# Patient Record
Sex: Female | Born: 1980 | Race: White | Hispanic: No | Marital: Single | Smoking: Never smoker
Health system: Southern US, Community
[De-identification: ages and names within clinical notes are randomized; demographics above are authoritative.]

## PROBLEM LIST (undated history)

## (undated) DIAGNOSIS — B379 Candidiasis, unspecified: Secondary | ICD-10-CM

## (undated) DIAGNOSIS — F32A Depression, unspecified: Secondary | ICD-10-CM

## (undated) DIAGNOSIS — F4310 Post-traumatic stress disorder, unspecified: Secondary | ICD-10-CM

## (undated) DIAGNOSIS — G35D Multiple sclerosis, unspecified: Secondary | ICD-10-CM

## (undated) DIAGNOSIS — M7989 Other specified soft tissue disorders: Secondary | ICD-10-CM

## (undated) DIAGNOSIS — N210 Calculus in bladder: Secondary | ICD-10-CM

## (undated) DIAGNOSIS — K219 Gastro-esophageal reflux disease without esophagitis: Secondary | ICD-10-CM

## (undated) DIAGNOSIS — E282 Polycystic ovarian syndrome: Secondary | ICD-10-CM

## (undated) DIAGNOSIS — L304 Erythema intertrigo: Secondary | ICD-10-CM

## (undated) DIAGNOSIS — G35 Multiple sclerosis: Secondary | ICD-10-CM

## (undated) DIAGNOSIS — G373 Acute transverse myelitis in demyelinating disease of central nervous system: Secondary | ICD-10-CM

## (undated) DIAGNOSIS — F329 Major depressive disorder, single episode, unspecified: Secondary | ICD-10-CM

## (undated) DIAGNOSIS — G8220 Paraplegia, unspecified: Secondary | ICD-10-CM

## (undated) HISTORY — DX: Other specified soft tissue disorders: M79.89

## (undated) HISTORY — DX: Erythema intertrigo: L30.4

## (undated) HISTORY — PX: TONSILLECTOMY: SUR1361

## (undated) HISTORY — PX: CHOLECYSTECTOMY: SHX55

## (undated) HISTORY — DX: Candidiasis, unspecified: B37.9

## (undated) HISTORY — PX: WISDOM TOOTH EXTRACTION: SHX21

## (undated) HISTORY — PX: CYSTECTOMY W/ URETEROILEAL CONDUIT: SUR361

---
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
## 2001-04-01 ENCOUNTER — Emergency Department (HOSPITAL_COMMUNITY): Admission: EM | Admit: 2001-04-01 | Discharge: 2001-04-01 | Payer: Self-pay | Admitting: Emergency Medicine

## 2001-11-06 ENCOUNTER — Other Ambulatory Visit: Admission: RE | Admit: 2001-11-06 | Discharge: 2001-11-06 | Payer: Self-pay | Admitting: Family Medicine

## 2002-11-10 ENCOUNTER — Other Ambulatory Visit: Admission: RE | Admit: 2002-11-10 | Discharge: 2002-11-10 | Payer: Self-pay | Admitting: Family Medicine

## 2003-05-18 ENCOUNTER — Emergency Department (HOSPITAL_COMMUNITY): Admission: EM | Admit: 2003-05-18 | Discharge: 2003-05-18 | Payer: Self-pay | Admitting: Emergency Medicine

## 2003-05-19 ENCOUNTER — Ambulatory Visit (HOSPITAL_COMMUNITY): Admission: RE | Admit: 2003-05-19 | Discharge: 2003-05-19 | Payer: Self-pay | Admitting: Family Medicine

## 2003-05-20 ENCOUNTER — Inpatient Hospital Stay (HOSPITAL_COMMUNITY): Admission: AD | Admit: 2003-05-20 | Discharge: 2003-05-25 | Payer: Self-pay | Admitting: Neurology

## 2003-05-21 ENCOUNTER — Encounter (INDEPENDENT_AMBULATORY_CARE_PROVIDER_SITE_OTHER): Payer: Self-pay | Admitting: Specialist

## 2003-05-25 ENCOUNTER — Inpatient Hospital Stay (HOSPITAL_COMMUNITY)
Admission: RE | Admit: 2003-05-25 | Discharge: 2003-07-15 | Payer: Self-pay | Admitting: Physical Medicine & Rehabilitation

## 2003-07-22 ENCOUNTER — Ambulatory Visit (HOSPITAL_COMMUNITY): Admission: RE | Admit: 2003-07-22 | Discharge: 2003-07-22 | Payer: Self-pay | Admitting: Urology

## 2003-09-03 ENCOUNTER — Emergency Department (HOSPITAL_COMMUNITY): Admission: EM | Admit: 2003-09-03 | Discharge: 2003-09-04 | Payer: Self-pay | Admitting: Emergency Medicine

## 2003-09-04 ENCOUNTER — Inpatient Hospital Stay (HOSPITAL_COMMUNITY): Admission: EM | Admit: 2003-09-04 | Discharge: 2003-09-07 | Payer: Self-pay | Admitting: Emergency Medicine

## 2003-09-13 ENCOUNTER — Encounter: Admission: RE | Admit: 2003-09-13 | Discharge: 2003-12-12 | Payer: Self-pay | Admitting: Neurology

## 2003-10-15 ENCOUNTER — Emergency Department (HOSPITAL_COMMUNITY): Admission: EM | Admit: 2003-10-15 | Discharge: 2003-10-15 | Payer: Self-pay | Admitting: *Deleted

## 2003-11-07 ENCOUNTER — Emergency Department (HOSPITAL_COMMUNITY): Admission: EM | Admit: 2003-11-07 | Discharge: 2003-11-07 | Payer: Self-pay | Admitting: Emergency Medicine

## 2003-12-13 ENCOUNTER — Encounter: Admission: RE | Admit: 2003-12-13 | Discharge: 2004-03-12 | Payer: Self-pay | Admitting: Neurology

## 2003-12-24 ENCOUNTER — Encounter: Admission: RE | Admit: 2003-12-24 | Discharge: 2004-03-23 | Payer: Self-pay | Admitting: Psychology

## 2004-03-28 ENCOUNTER — Encounter: Admission: RE | Admit: 2004-03-28 | Discharge: 2004-06-26 | Payer: Self-pay | Admitting: Neurology

## 2005-04-13 ENCOUNTER — Inpatient Hospital Stay (HOSPITAL_COMMUNITY): Admission: EM | Admit: 2005-04-13 | Discharge: 2005-04-17 | Payer: Self-pay | Admitting: Emergency Medicine

## 2005-04-24 ENCOUNTER — Encounter: Admission: RE | Admit: 2005-04-24 | Discharge: 2005-07-23 | Payer: Self-pay | Admitting: Neurology

## 2005-07-20 ENCOUNTER — Other Ambulatory Visit: Admission: RE | Admit: 2005-07-20 | Discharge: 2005-07-20 | Payer: Self-pay | Admitting: Family Medicine

## 2005-12-06 ENCOUNTER — Encounter: Admission: RE | Admit: 2005-12-06 | Discharge: 2006-03-06 | Payer: Self-pay | Admitting: Orthopedic Surgery

## 2005-12-21 ENCOUNTER — Ambulatory Visit (HOSPITAL_COMMUNITY): Admission: RE | Admit: 2005-12-21 | Discharge: 2005-12-21 | Payer: Self-pay | Admitting: Orthopedic Surgery

## 2006-03-28 ENCOUNTER — Emergency Department (HOSPITAL_COMMUNITY): Admission: EM | Admit: 2006-03-28 | Discharge: 2006-03-28 | Payer: Self-pay | Admitting: Emergency Medicine

## 2006-05-18 ENCOUNTER — Inpatient Hospital Stay (HOSPITAL_COMMUNITY): Admission: RE | Admit: 2006-05-18 | Discharge: 2006-05-29 | Payer: Self-pay | Admitting: Urology

## 2006-05-20 ENCOUNTER — Encounter (INDEPENDENT_AMBULATORY_CARE_PROVIDER_SITE_OTHER): Payer: Self-pay | Admitting: Specialist

## 2006-11-08 ENCOUNTER — Emergency Department (HOSPITAL_COMMUNITY): Admission: EM | Admit: 2006-11-08 | Discharge: 2006-11-08 | Payer: Self-pay | Admitting: Emergency Medicine

## 2007-01-01 ENCOUNTER — Inpatient Hospital Stay (HOSPITAL_COMMUNITY): Admission: EM | Admit: 2007-01-01 | Discharge: 2007-01-16 | Payer: Self-pay | Admitting: Emergency Medicine

## 2007-02-01 ENCOUNTER — Emergency Department (HOSPITAL_COMMUNITY): Admission: EM | Admit: 2007-02-01 | Discharge: 2007-02-02 | Payer: Self-pay | Admitting: Emergency Medicine

## 2007-02-19 ENCOUNTER — Inpatient Hospital Stay (HOSPITAL_COMMUNITY): Admission: RE | Admit: 2007-02-19 | Discharge: 2007-02-20 | Payer: Self-pay | Admitting: Urology

## 2007-07-22 ENCOUNTER — Emergency Department (HOSPITAL_COMMUNITY): Admission: EM | Admit: 2007-07-22 | Discharge: 2007-07-22 | Payer: Self-pay | Admitting: Emergency Medicine

## 2007-10-08 ENCOUNTER — Emergency Department (HOSPITAL_COMMUNITY): Admission: EM | Admit: 2007-10-08 | Discharge: 2007-10-08 | Payer: Self-pay | Admitting: Emergency Medicine

## 2007-10-19 ENCOUNTER — Emergency Department (HOSPITAL_COMMUNITY): Admission: EM | Admit: 2007-10-19 | Discharge: 2007-10-19 | Payer: Self-pay | Admitting: Emergency Medicine

## 2007-10-21 ENCOUNTER — Ambulatory Visit (HOSPITAL_COMMUNITY): Admission: RE | Admit: 2007-10-21 | Discharge: 2007-10-21 | Payer: Self-pay | Admitting: Urology

## 2007-11-02 ENCOUNTER — Inpatient Hospital Stay (HOSPITAL_COMMUNITY): Admission: AD | Admit: 2007-11-02 | Discharge: 2007-11-10 | Payer: Self-pay | Admitting: Internal Medicine

## 2007-11-02 ENCOUNTER — Encounter: Payer: Self-pay | Admitting: Emergency Medicine

## 2007-11-14 ENCOUNTER — Inpatient Hospital Stay (HOSPITAL_COMMUNITY): Admission: EM | Admit: 2007-11-14 | Discharge: 2007-11-18 | Payer: Self-pay | Admitting: Emergency Medicine

## 2007-12-12 ENCOUNTER — Emergency Department (HOSPITAL_COMMUNITY): Admission: EM | Admit: 2007-12-12 | Discharge: 2007-12-12 | Payer: Self-pay | Admitting: Emergency Medicine

## 2008-01-07 ENCOUNTER — Inpatient Hospital Stay (HOSPITAL_COMMUNITY): Admission: AD | Admit: 2008-01-07 | Discharge: 2008-01-07 | Payer: Self-pay | Admitting: Obstetrics & Gynecology

## 2008-01-15 ENCOUNTER — Ambulatory Visit: Payer: Self-pay | Admitting: Obstetrics & Gynecology

## 2008-01-20 ENCOUNTER — Inpatient Hospital Stay (HOSPITAL_COMMUNITY): Admission: AD | Admit: 2008-01-20 | Discharge: 2008-01-24 | Payer: Self-pay | Admitting: Internal Medicine

## 2008-01-22 ENCOUNTER — Encounter (INDEPENDENT_AMBULATORY_CARE_PROVIDER_SITE_OTHER): Payer: Self-pay | Admitting: Gastroenterology

## 2008-02-16 ENCOUNTER — Inpatient Hospital Stay (HOSPITAL_COMMUNITY): Admission: EM | Admit: 2008-02-16 | Discharge: 2008-02-20 | Payer: Self-pay | Admitting: Emergency Medicine

## 2008-02-17 ENCOUNTER — Encounter (INDEPENDENT_AMBULATORY_CARE_PROVIDER_SITE_OTHER): Payer: Self-pay | Admitting: Internal Medicine

## 2008-03-17 ENCOUNTER — Ambulatory Visit: Payer: Self-pay | Admitting: Vascular Surgery

## 2008-03-17 ENCOUNTER — Ambulatory Visit (HOSPITAL_COMMUNITY): Admission: RE | Admit: 2008-03-17 | Discharge: 2008-03-17 | Payer: Self-pay | Admitting: General Surgery

## 2008-03-17 ENCOUNTER — Encounter (INDEPENDENT_AMBULATORY_CARE_PROVIDER_SITE_OTHER): Payer: Self-pay | Admitting: General Surgery

## 2008-04-13 ENCOUNTER — Encounter (INDEPENDENT_AMBULATORY_CARE_PROVIDER_SITE_OTHER): Payer: Self-pay | Admitting: General Surgery

## 2008-04-14 ENCOUNTER — Inpatient Hospital Stay (HOSPITAL_COMMUNITY): Admission: RE | Admit: 2008-04-14 | Discharge: 2008-04-16 | Payer: Self-pay | Admitting: General Surgery

## 2008-08-06 ENCOUNTER — Other Ambulatory Visit: Admission: RE | Admit: 2008-08-06 | Discharge: 2008-08-06 | Payer: Self-pay | Admitting: Family Medicine

## 2008-10-29 ENCOUNTER — Emergency Department (HOSPITAL_COMMUNITY): Admission: EM | Admit: 2008-10-29 | Discharge: 2008-10-30 | Payer: Self-pay | Admitting: Emergency Medicine

## 2009-01-17 ENCOUNTER — Emergency Department (HOSPITAL_COMMUNITY): Admission: EM | Admit: 2009-01-17 | Discharge: 2009-01-17 | Payer: Self-pay | Admitting: Emergency Medicine

## 2009-02-21 ENCOUNTER — Ambulatory Visit (HOSPITAL_COMMUNITY): Admission: RE | Admit: 2009-02-21 | Discharge: 2009-02-21 | Payer: Self-pay | Admitting: Urology

## 2009-03-30 ENCOUNTER — Encounter: Admission: RE | Admit: 2009-03-30 | Discharge: 2009-06-28 | Payer: Self-pay | Admitting: Neurology

## 2009-07-17 ENCOUNTER — Emergency Department (HOSPITAL_COMMUNITY): Admission: EM | Admit: 2009-07-17 | Discharge: 2009-07-18 | Payer: Self-pay | Admitting: Emergency Medicine

## 2009-09-26 ENCOUNTER — Emergency Department (HOSPITAL_COMMUNITY): Admission: EM | Admit: 2009-09-26 | Discharge: 2009-09-26 | Payer: Self-pay | Admitting: Emergency Medicine

## 2009-12-12 ENCOUNTER — Emergency Department (HOSPITAL_COMMUNITY): Admission: EM | Admit: 2009-12-12 | Discharge: 2009-12-12 | Payer: Self-pay | Admitting: Emergency Medicine

## 2010-04-23 ENCOUNTER — Encounter: Payer: Self-pay | Admitting: Urology

## 2010-04-24 ENCOUNTER — Encounter: Payer: Self-pay | Admitting: Family Medicine

## 2010-06-18 LAB — URINALYSIS, ROUTINE W REFLEX MICROSCOPIC
Glucose, UA: NEGATIVE mg/dL
Protein, ur: NEGATIVE mg/dL
Specific Gravity, Urine: 1.015 (ref 1.005–1.030)
Urobilinogen, UA: 1 mg/dL (ref 0.0–1.0)

## 2010-06-18 LAB — URINE CULTURE

## 2010-06-18 LAB — URINE MICROSCOPIC-ADD ON

## 2010-06-20 LAB — URINALYSIS, ROUTINE W REFLEX MICROSCOPIC
Protein, ur: 30 mg/dL — AB
Urobilinogen, UA: 1 mg/dL (ref 0.0–1.0)

## 2010-06-20 LAB — URINE MICROSCOPIC-ADD ON

## 2010-06-20 LAB — CBC
MCHC: 34.9 g/dL (ref 30.0–36.0)
MCV: 87.3 fL (ref 78.0–100.0)
Platelets: 252 10*3/uL (ref 150–400)

## 2010-06-20 LAB — BASIC METABOLIC PANEL
BUN: 6 mg/dL (ref 6–23)
CO2: 26 mEq/L (ref 19–32)
Chloride: 105 mEq/L (ref 96–112)
Creatinine, Ser: 0.37 mg/dL — ABNORMAL LOW (ref 0.4–1.2)

## 2010-06-20 LAB — POCT PREGNANCY, URINE: Preg Test, Ur: NEGATIVE

## 2010-06-20 LAB — DIFFERENTIAL
Basophils Relative: 0 % (ref 0–1)
Eosinophils Absolute: 0.3 10*3/uL (ref 0.0–0.7)
Monocytes Relative: 6 % (ref 3–12)
Neutrophils Relative %: 55 % (ref 43–77)

## 2010-07-05 LAB — PREGNANCY, URINE: Preg Test, Ur: NEGATIVE

## 2010-07-06 LAB — BASIC METABOLIC PANEL
Calcium: 9.5 mg/dL (ref 8.4–10.5)
GFR calc Af Amer: >60 mL/min (ref >60)
GFR calc non Af Amer: >60 mL/min (ref >60)
Glucose, Bld: 98 mg/dL (ref 70–99)
Potassium: 3.7 mEq/L (ref 3.5–5.1)
Sodium: 139 mEq/L (ref 135–145)

## 2010-07-06 LAB — URINALYSIS, ROUTINE W REFLEX MICROSCOPIC
Bilirubin Urine: NEGATIVE
Glucose, UA: NEGATIVE mg/dL
Specific Gravity, Urine: 1.014 (ref 1.005–1.030)
pH: 7 (ref 5.0–8.0)

## 2010-07-06 LAB — DIFFERENTIAL
Basophils Absolute: 0 10*3/uL (ref 0.0–0.1)
Lymphocytes Relative: 14 % (ref 12–46)
Lymphs Abs: 1.4 10*3/uL (ref 0.7–4.0)
Monocytes Absolute: 0.2 10*3/uL (ref 0.1–1.0)
Neutro Abs: 8.4 10*3/uL — ABNORMAL HIGH (ref 1.7–7.7)

## 2010-07-06 LAB — URINE MICROSCOPIC-ADD ON

## 2010-07-06 LAB — URINE CULTURE: Colony Count: 30000

## 2010-07-06 LAB — CBC
Hemoglobin: 13.4 g/dL (ref 12.0–15.0)
RBC: 4.48 MIL/uL (ref 3.87–5.11)
RDW: 12.5 % (ref 11.5–15.5)
WBC: 10.1 10*3/uL (ref 4.0–10.5)

## 2010-07-09 LAB — URINE CULTURE: Colony Count: >=100000

## 2010-07-09 LAB — COMPREHENSIVE METABOLIC PANEL
ALT: 13 U/L (ref 0–35)
AST: 17 U/L (ref 0–37)
CO2: 28 mEq/L (ref 19–32)
Calcium: 9.5 mg/dL (ref 8.4–10.5)
GFR calc Af Amer: >60 mL/min (ref >60)
Sodium: 140 mEq/L (ref 135–145)
Total Protein: 6.6 g/dL (ref 6.0–8.3)

## 2010-07-09 LAB — URINALYSIS, ROUTINE W REFLEX MICROSCOPIC
Glucose, UA: NEGATIVE mg/dL
Ketones, ur: NEGATIVE mg/dL
Protein, ur: NEGATIVE mg/dL

## 2010-07-09 LAB — DIFFERENTIAL
Eosinophils Absolute: 0.2 10*3/uL (ref 0.0–0.7)
Eosinophils Relative: 3 % (ref 0–5)
Lymphs Abs: 2.7 10*3/uL (ref 0.7–4.0)
Monocytes Absolute: 0.4 10*3/uL (ref 0.1–1.0)
Monocytes Relative: 5 % (ref 3–12)

## 2010-07-09 LAB — CBC
MCHC: 33.7 g/dL (ref 30.0–36.0)
RBC: 4.45 MIL/uL (ref 3.87–5.11)
RDW: 12.4 % (ref 11.5–15.5)

## 2010-07-09 LAB — URINE MICROSCOPIC-ADD ON

## 2010-07-17 LAB — COMPREHENSIVE METABOLIC PANEL
ALT: 12 U/L (ref 0–35)
ALT: 38 U/L — ABNORMAL HIGH (ref 0–35)
AST: 43 U/L — ABNORMAL HIGH (ref 0–37)
AST: 72 U/L — ABNORMAL HIGH (ref 0–37)
Albumin: 3.5 g/dL (ref 3.5–5.2)
Alkaline Phosphatase: 58 U/L (ref 39–117)
Alkaline Phosphatase: 66 U/L (ref 39–117)
Alkaline Phosphatase: 86 U/L (ref 39–117)
BUN: 3 mg/dL — ABNORMAL LOW (ref 6–23)
BUN: 5 mg/dL — ABNORMAL LOW (ref 6–23)
CO2: 28 mEq/L (ref 19–32)
CO2: 31 mEq/L (ref 19–32)
CO2: 31 mEq/L (ref 19–32)
Calcium: 8.6 mg/dL (ref 8.4–10.5)
Calcium: 9.8 mg/dL (ref 8.4–10.5)
Chloride: 101 mEq/L (ref 96–112)
Chloride: 103 mEq/L (ref 96–112)
Chloride: 104 mEq/L (ref 96–112)
Chloride: 105 mEq/L (ref 96–112)
Creatinine, Ser: 0.42 mg/dL (ref 0.4–1.2)
Creatinine, Ser: 0.43 mg/dL (ref 0.4–1.2)
GFR calc Af Amer: >60 mL/min (ref >60)
GFR calc Af Amer: >60 mL/min (ref >60)
GFR calc non Af Amer: >60 mL/min (ref >60)
GFR calc non Af Amer: >60 mL/min (ref >60)
GFR calc non Af Amer: >60 mL/min (ref >60)
Glucose, Bld: 101 mg/dL — ABNORMAL HIGH (ref 70–99)
Glucose, Bld: 110 mg/dL — ABNORMAL HIGH (ref 70–99)
Potassium: 3.6 mEq/L (ref 3.5–5.1)
Potassium: 3.8 mEq/L (ref 3.5–5.1)
Potassium: 4.4 mEq/L (ref 3.5–5.1)
Sodium: 138 mEq/L (ref 135–145)
Sodium: 142 mEq/L (ref 135–145)
Total Bilirubin: 0.5 mg/dL (ref 0.3–1.2)
Total Bilirubin: 0.6 mg/dL (ref 0.3–1.2)
Total Bilirubin: 0.6 mg/dL (ref 0.3–1.2)
Total Bilirubin: 0.8 mg/dL (ref 0.3–1.2)
Total Protein: 5.2 g/dL — ABNORMAL LOW (ref 6.0–8.3)
Total Protein: 5.8 g/dL — ABNORMAL LOW (ref 6.0–8.3)

## 2010-07-17 LAB — DIFFERENTIAL
Basophils Relative: 0 % (ref 0–1)
Eosinophils Absolute: 0.1 10*3/uL (ref 0.0–0.7)
Eosinophils Relative: 2 % (ref 0–5)
Neutrophils Relative %: 69 % (ref 43–77)

## 2010-07-17 LAB — CBC
HCT: 30.9 % — ABNORMAL LOW (ref 36.0–46.0)
HCT: 32 % — ABNORMAL LOW (ref 36.0–46.0)
Hemoglobin: 10.4 g/dL — ABNORMAL LOW (ref 12.0–15.0)
Hemoglobin: 13.9 g/dL (ref 12.0–15.0)
MCHC: 33.3 g/dL (ref 30.0–36.0)
MCHC: 34.1 g/dL (ref 30.0–36.0)
MCV: 86.4 fL (ref 78.0–100.0)
Platelets: 211 10*3/uL (ref 150–400)
RBC: 3.6 MIL/uL — ABNORMAL LOW (ref 3.87–5.11)
RBC: 3.7 MIL/uL — ABNORMAL LOW (ref 3.87–5.11)
RBC: 4.8 MIL/uL (ref 3.87–5.11)
RDW: 12.5 % (ref 11.5–15.5)
RDW: 12.7 % (ref 11.5–15.5)
WBC: 5.6 10*3/uL (ref 4.0–10.5)
WBC: 7 10*3/uL (ref 4.0–10.5)
WBC: 8.1 10*3/uL (ref 4.0–10.5)

## 2010-07-17 LAB — URINALYSIS, ROUTINE W REFLEX MICROSCOPIC
Glucose, UA: NEGATIVE mg/dL
Hgb urine dipstick: NEGATIVE
Protein, ur: 30 mg/dL — AB

## 2010-07-17 LAB — AMYLASE: Amylase: 23 U/L — ABNORMAL LOW (ref 27–131)

## 2010-07-17 LAB — URINE MICROSCOPIC-ADD ON

## 2010-07-19 ENCOUNTER — Other Ambulatory Visit (HOSPITAL_COMMUNITY)
Admission: RE | Admit: 2010-07-19 | Discharge: 2010-07-19 | Disposition: A | Discharge status: Home / Self Care | Payer: Self-pay | Source: Ambulatory Visit | Attending: Obstetrics and Gynecology | Admitting: Obstetrics and Gynecology

## 2010-07-19 ENCOUNTER — Other Ambulatory Visit: Payer: Self-pay | Admitting: Obstetrics and Gynecology

## 2010-07-19 DIAGNOSIS — Z01419 Encounter for gynecological examination (general) (routine) without abnormal findings: Secondary | ICD-10-CM | POA: Insufficient documentation

## 2010-07-20 ENCOUNTER — Other Ambulatory Visit (HOSPITAL_COMMUNITY): Payer: Self-pay | Admitting: Obstetrics and Gynecology

## 2010-07-20 DIAGNOSIS — N926 Irregular menstruation, unspecified: Secondary | ICD-10-CM

## 2010-07-26 ENCOUNTER — Other Ambulatory Visit (HOSPITAL_COMMUNITY): Payer: Self-pay

## 2010-07-26 ENCOUNTER — Ambulatory Visit (HOSPITAL_COMMUNITY)
Admission: RE | Admit: 2010-07-26 | Discharge: 2010-07-26 | Disposition: A | Discharge status: Home / Self Care | Payer: Medicaid Other | Source: Ambulatory Visit | Attending: Obstetrics and Gynecology | Admitting: Obstetrics and Gynecology

## 2010-07-26 DIAGNOSIS — N926 Irregular menstruation, unspecified: Secondary | ICD-10-CM | POA: Insufficient documentation

## 2010-08-15 NOTE — Consult Note (Signed)
[name], [name] NO.:  [id number]   MEDICAL RECORD NO.:  [id number]          PATIENT TYPE:  OIB   LOCATION:  0154                         FACILITY:  [facility]   PHYSICIAN:  John C. Madilyn Fireman, M.D.    DATE OF BIRTH:  [birth year]   DATE OF CONSULTATION:  04/14/2008  DATE OF DISCHARGE:                                 CONSULTATION   CONTINUATION - GASTROENTEROLOGY CONSULTATION:   REASON FOR CONSULTATION:  Possible retained common bile duct stone.   SOCIAL HISTORY:  She is single.  She does not smoke or drink alcohol.  She is cared for by her mother.   FAMILY HISTORY:  Significant for her great grandfather who had colon  cancer, both parents who had colon polyps.  Her father had gallstones as  did many other distant family members.   PHYSICAL EXAMINATION:  VITAL SIGNS:  Vital signs today are as follows:  Temperature 97.8, pulse 105, respirations 16, blood pressure 99/56.  GENERAL:  She is alert and oriented, in no apparent distress.  She is  currently receiving her bath.  HEART:  Her heart has a regular rate and rhythm with no murmurs, rubs or  gallops.  LUNGS:  Clear to auscultation.  ABDOMEN:  Distended.  It is normally somewhat distended.  Her bandages  are clean and dry.  She has decreased bowel sounds.  The patient was not  tender to light palpation.   LABORATORY DATA:  Current labs show a hemoglobin of 11.9, hematocrit  35.7, white blood cell count 8.1, platelet count 211,000.  BUN 8,  creatinine 0.43.  Liver function tests are as follows:  AST 72, ALT 68,  alkaline phosphatase 66, total bilirubin 0.8, amylase 23, lipase 21.  Liver function tests on April 09, 2008 were normal.   Radiological exams include a cholangiogram.  She had a small retained  stone in her common bile duct with no obstruction.   ASSESSMENT:  Dr. Dorena Cookey has seen and examined the patient, collected  a history and reviewed her chart.  His impression is this is a 30-year-  old female  with a complex past surgical history.  She is status post  laparoscopic cholecystectomy with lysis of adhesions.  There is a  question of retained stone on intraoperative cholangiogram.  She has had  a small rise in her liver function tests.  This would be expected  immediately status post surgery; however, she has no white count.  She  is essentially afebrile.  She is not particularly tender.  Would advise  watching  her labs and clinical signs for a day or so, possibly considering an  MRCP to evaluate for retained common bile duct stone.  Will be glad to  follow with you.   Thanks very much for this consultation.      Stephani Police, PA    ______________________________  Everardo All Madilyn Fireman, M.D.    MLY/MEDQ  D:  04/14/2008  T:  04/14/2008  Job:  [id number]   cc:   Fayrene Fearing L. Malon Kindle., M.D.  Fax: [phone]   Angelia Mould. Derrell Lolling, M.D.  [address]  [place]  [place] [address]

## 2010-08-15 NOTE — Consult Note (Signed)
NAMERYNN, MARKIEWICZ NO.:  [id number]   MEDICAL RECORD NO.:  [id number]          PATIENT TYPE:  INP   LOCATION:  5528                         FACILITY:  [facility]   PHYSICIAN:  James L. Randa Evens, M.D. DATE OF BIRTH:  02/01/1981   DATE OF CONSULTATION:  01/20/2008  DATE OF DISCHARGE:                                 CONSULTATION   We were asked to see Ms. Izola Price today in consultation by Clair Gulling A  team, Dr. Suanne Marker.   HISTORY OF PRESENT ILLNESS:  This is a 30 year old female with a  complicated past medical history including paraplegia and frequent UTIs  necessitating an open cystolithotomy and an exploratory laparotomy with  ileal loop conduit formation.  The patient has had a recent MRI/CT scan  and ultrasound that shows gallstones.  It was felt that she needed to  have her gallbladder out.  She was evaluated by [facility]  who felt that she was not a good surgical candidate and that her  gallbladder was not acute.  They would delay removing it.  The patient  continues to have postprandial nausea without vomiting.  She has had  weight loss and poorly localized abdominal pain.  She is now unable to  eat.  Her symptoms have become so bad that she decided to come to [facility] for admission.   PAST MEDICAL HISTORY:  Significant for frequent urinary tract infections  again necessitating an ileal loop conduit.  She has a history of  multiple sclerosis.  She has a history of transverse myelitis with  paraplegia.  She has a history of MRSA sacral decubitus ulcer.  She has  a history of GERD, depression, and recently cholelithiasis without signs  of acute cholecystitis.  Past medical history also includes migraines,  seizure disorder, depression, and anxiety.   CURRENT MEDICATIONS:  Include mupirocin topical ointment 2%,  hydrocodone, acetaminophen, sertraline 100 mg, ranitidine 150 mg,  diazepam, baclofen, doxycycline, and Nasacort nasal  spray.   ALLERGIES:  She has allergies to IODINE, LATEX, and ASPIRIN.   FAMILY HISTORY:  Significant for her great grandfather who had colon  cancer and both parents who had colon polyps.  Further, her father had  gallstones as did other distant family members.   SOCIAL HISTORY:  She is single.  She does not smoke or drink alcohol.  She is cared for by her mother.   PHYSICAL EXAMINATION:  She is alert and oriented.  She has good bowel  sounds.  Her abdomen is soft and nondistended with vague tenderness.   Labs including a CBC and CMET are pending.   Radiological exams include an abdominal ultrasound that was done on  November 02, 2007.  She was found to have cholelithiasis, but no  sonographic findings for acute cholecystitis.  Her common bile duct was  normal in caliber.  She had a CT scan done on December 12, 2007.  She  was found to have cholelithiasis, prior cystectomy, and ileal loop  diversion.  A small amount of high attenuation material in both renal  collecting systems.  She had  a 5-cm left adnexal cyst presenting a  functional ovarian cyst and she had a large amount of colonic stool.   ASSESSMENT:  Dr. Carman Ching has seen and examined the patient,  collected a history and reviewed her chart.  His impression is that this  is a 30 year old female with multiple medical problems whose abdominal  pain and nausea  have become so severe that she is unable to eat.  She is experiencing  weight loss.  His plan is to begin her evaluation with an upper  endoscopy as well as urine culture.  He has called [facility] for a surgical consult and reevaluation.      Stephani Police, PA    ______________________________  Llana Aliment Randa Evens, M.D.    MLY/MEDQ  D:  01/20/2008  T:  01/21/2008  Job:  [id number]   cc:   Fayrene Fearing L. Malon Kindle., M.D.  Sigmund I. Patsi Sears, M.D.

## 2010-08-15 NOTE — Op Note (Signed)
NAMEHARSHITA, BERNALES NO.:  [id number]   MEDICAL RECORD NO.:  [id number]          PATIENT TYPE:  AMB   LOCATION:  DAY                          FACILITY:  [facility]   PHYSICIAN:  Angelia Mould. Derrell Lolling, M.D.DATE OF BIRTH:  1981/03/31   DATE OF PROCEDURE:  04/13/2008  DATE OF DISCHARGE:                               OPERATIVE REPORT   PREOPERATIVE DIAGNOSIS:  Chronic cholecystitis with cholelithiasis.   POSTOPERATIVE DIAGNOSIS:  Chronic cholecystitis with cholelithiasis,  extensive intra-abdominal adhesions.   OPERATION PERFORMED:  1. Diagnostic laparoscopy with extensive laparoscopic lysis of      adhesions (45 minutes).  2. Laparoscopic cholecystectomy with intraoperative cholangiogram.   SURGEON:  Dr. Claud Kelp.   FIRST ASSISTANT:  Dr. Consuello Bossier.   OPERATIVE INDICATIONS:  This is a 30 year old white female who is  wheelchair bound due to lower extremity paraplegia from transverse  myelitis.  She has had recurrent bladder stones and subsequently had an  ileal loop urinary diversion by Dr. Patsi Sears which apparently was a  dificult,lengthy case.  She recovered uneventfully.  She was  hospitalized a couple of months ago with nausea and abdominal pain.  She  was found to have gallstones but she was also found to have a large  gastric ulcer.  We decided to treat the ulcer.  Follow up endoscopy has  shown that the ulcer has healed but the patient was still having some  upper abdominal discomfort with intermittent pain and nausea, although  it is not as bad as it used to be.  She is fearful that she will have a  gallbladder catastrophe in the future.  Both she and her mother as well  as Dr. Randa Evens are interested in her having a cholecystectomy.  She was  counseled as an outpatient.  She was brought to operating room  electively.   OPERATIVE TECHNIQUE:  Following the induction of general endotracheal  anesthesia, we prepped the abdomen with iodine free  prep.  We then  placed a clear adhesive plastic sheet to exclude the ileal loop ostomy  bag from the operative field.  We then draped her out.  We held a time-  out and surgical checklist was performed.  Intravenous antibiotics were  given.   It should be noted that she was given intravenous antibiotics and  developed itching and a rash prior to induction of anesthesia.  She was  given Benadryl and this resolved.  We then gave her intravenous Ancef  without any problems.   I made a small incision in the left subcostal area.  We inserted a  Veress needle and a had good water siphon and connected this to the  insufflator and we then got a nice pneumoperitoneum.  We removed the  Veress needle and inserted a 5-mm trocar, using the optical technique  under direct vision.  We had a clear space in the left upper quadrant  and no evidence of any injury.  There were extensive adhesions  especially in the right midabdomen and some in the right upper quadrant  but we could sneak under the falciform ligament and see  the liver and  gallbladder which were not involved with adhesions.  We placed a second  5 mm trocar in the right subcostal area under direct vision and using  scissor we took down extensive adhesions, freeing all the omentum from  the falciform ligament, taking the extensive amount of omentum from the  abdominal wall until we got down to the level of the umbilicus and at  that point there appeared to be small bowel densely adhered to the  abdominal wall.  We were able to identify an area just above the  umbilicus which was a free space.  We made a vertically oriented  incision just above the umbilicus and made a small incision in the  fascia and placed stay sutures of 0 Vicryl on both sides.  We placed a  Hassan trocar here under direct vision and it was about 1.5 cm above the  adhesions.  We then was switched over to a 10-mm camera and placed a 11-  mm trocar in subxiphoid region and  two 5 mm trocars in the right upper  quadrant.   The gallbladder was somewhat thick-walled and chronically inflamed but  we could grasp it.  The upper body and fundus of the gallbladder were  fairly significantly intrahepatic.  We lifted up the infundibulum.  We  incised the peritoneum on both sides and dissected out the cystic artery  and cystic duct.  We isolated the anterior branch of the cystic artery  as it went onto the wall of gallbladder, secured it with multiple metal  clips and divided it.  We then dissected out a nice window behind the  cystic duct.  A cholangiogram catheter was inserted into the cystic  duct.  A cholangiogram was obtained using the C-arm.  The cholangiogram  showed normal intrahepatic and extrahepatic bile ducts, no filling  defects, no deformity and good flow of contrast into the duodenum.  Biliary tree was actually somewhat small in caliber.  The cholangiogram  catheter was removed.  The cystic duct was secured with multiple metal  clips and divided.  We isolated the posterior branch of the cystic  artery, secured it with metal clips and divided it.  We then dissected  the gallbladder from its bed with electrocautery and a little bit of  blunt dissection.  The upper part near the fundus had to go deep into  the liver because it was intrahepatic but we ultimately were able to get  this out.  We made one hole in the gallbladder and spilled a couple of  multifaceted stones which were retrieved and removed.  After we removed  the gallbladder completely, we placed it in a specimen bag and removed  it.  We replaced the trocars.  We had to cauterize a  few areas of raw  liver surface.  After all this was done, the bleeding seemed to be  controlled.  We irrigated this extensively.  We placed Surgicel gauze in  the bed of the gallbladder.  We placed a 19-French Blake drain in the  subphrenic space and brought it out through one of the trocar sites in  the right  upper quadrant.  This was sutured to the skin with nylon  suture and connected to a suction bulb.   We passed a camera around to different trocars and checked all the  trocar sites and all the adhesions and found no evidence of any  intestinal injury or bleeding.  The pneumoperitoneum was released.  The  fascia just above the umbilicus was closed with two interrupted figure-  of-eight sutures of 0 Vicryl.  All the skin incisions were irrigated and  closed with subcuticular sutures of 4-0 Monocryl and Steri-Strips.  Clean bandages were placed and the patient taken to the recovery room in  stable condition.  Estimated blood loss was about 30-40 mL.   COMPLICATIONS:  None.   Sponge, needle and instrument counts were correct.      Angelia Mould. Derrell Lolling, M.D.  Electronically Signed     HMI/MEDQ  D:  04/13/2008  T:  04/13/2008  Job:  [id number]   cc:   Bryan Lemma. Manus Gunning, M.D.  Fax: [phone]   Llana Aliment. Malon Kindle., M.D.  Fax: [phone]   Sigmund I. Patsi Sears, M.D.  Fax: [phone]

## 2010-08-15 NOTE — Consult Note (Signed)
[name], [name] NO.:  [id number]   MEDICAL RECORD NO.:  [id number]          PATIENT TYPE:  INP   LOCATION:  5703                         FACILITY:  [facility]   PHYSICIAN:  Mark C. Vernie Ammons, M.D.  DATE OF BIRTH:  [birth year]   DATE OF CONSULTATION:  01/05/2007  DATE OF DISCHARGE:                                 CONSULTATION   REFERRING PHYSICIAN:  Sigmund I. Patsi Sears, M.D.   REASON FOR CONSULTATION:  Further evaluation of a right ureteral  calculus.   HISTORY OF PRESENT ILLNESS:  She is a 30 year old, white, female patient  of Dr. Imelda Pillow who has a history of transverse myelitis with  secondary lower extremity paraplegia.  She had multiple bladder calculi  and in February 2008, underwent open cystolithotomy and ileal loop  diversion.  She did well with minimal bladder infections.  She was  admitted to the hospital on January 01, 2007, with a fever to 103.  She  was started on broad-spectrum antibiotics and since her admission, her  temperature has now come down to normal where it has remained and her  white count has also normalized.  She reports she had a single short,  abrupt onset of pain in the suprapubic region yesterday.  She said it  resolved just as quickly as it came.  It was not associated with any  nausea or vomiting, although she does report having some nausea prior to  her admission.  She has not seen any hematuria.  She currently is  without complaint.   PAST MEDICAL HISTORY:  1. Positive for transverse myelitis.  2. Paraplegia secondary to the transverse myelitis.  3. Chronic sacral decubitus, stage III, treated with wound V.A.C.   PAST SURGICAL HISTORY:  Suprapubic cystotomy with removal of bladder  calculi and formation of ileal conduit.   ALLERGIES:  INTRAVENOUS CONTRAST.   MEDICATIONS ON ADMISSION:  Gabapentin, nitrofurantoin, baclofen,  diazepam, Artificial tears, vitamins and sertraline.   SOCIAL HISTORY:  No tobacco or ethanol  use.   FAMILY HISTORY:  Father had kidney stones.  Also significant for  diabetes, depression and hypertension.   REVIEW OF SYSTEMS:  As noted above in HPI.  All other review of systems  currently negative.   PHYSICAL EXAMINATION:  VITAL SIGNS:  Temperature is 97.8, pulse 72,  blood pressure 98/70.  GENERAL:  The patient is a well-developed, well-nourished, white female  in no apparent distress.  HEENT:  Atraumatic, normocephalic.  Oropharynx clear.  CHEST:  Reveals normal respiratory effort.  CARDIOVASCULAR:  Regular rate and rhythm.  ABDOMEN:  Soft and nontender, nondistended with no peritoneal signs.  There is a ileal conduit stoma in the right lower quadrant that is  draining clear urine and is pink and viable.  EXTREMITIES:  Without clubbing or cyanosis.  There is buttocks decubitus  noted.  NEUROLOGIC:  She has paraplegia with minimal sensation in the abdomen to  light touch.  She does also appear to have some degree of possible  mental retardation.   LABORATORY DATA AND X-RAY FINDINGS:  Current white count is 6.9.  Her  creatinine is  normal at 0.39.  Her blood cultures both came back  negative.  Her urine culture grew Providencia that was sensitive to  Rocephin which she is on as well as Septra.  Her urinalysis on admission  was nitrite and leukocyte esterase positive with 11-20 wbc's, 3-6 rbc's.   CT scan and MRI images were reviewed.  She has bilateral nonobstructing  renal calculi.  There is also right hydronephrosis with what appears to  be a 7-mm stone in the distal right ureter just above the conduit.  There is a single round stone in the conduit as well.   IMPRESSION:  1. Neurogenic bladder secondary to transverse myelitis, status post      ileal conduit diversion.  2. Bilateral nonobstructing renal calculi.  3. Ileal conduit stone not causing any problem at this time.  4. Febrile urinary tract infection.  She is currently now afebrile      with normal white  count and positive urine culture, but negative      blood cultures which appears to rule out urosepsis.  5. She has a right ureteral calculus with associated mild obstruction.   RECOMMENDATIONS:  1. Check KUB.  If the stone can be seen on KUB, then lithotripsy would      be the preferred means of treating her right distal ureteral stone.      If it cannot be visualized, then she would either need to have a      retrograde procedure through her conduit which might be quite      difficult versus a percutaneous nephrostomy tube placement with      antegrade extraction of her stone.  2. Febrile UTI appears treated, so I do not feel that emergent      percutaneous tube placement is indicated at this time and would      recommend continuing current parenteral antibiotics.      Mark C. Vernie Ammons, M.D.  Electronically Signed     MCO/MEDQ  D:  01/05/2007  T:  01/06/2007  Job:  [id number]

## 2010-08-15 NOTE — Discharge Summary (Signed)
NAMESHARLYNN, [name] NO.:  [id number]   MEDICAL RECORD NO.:  [id number]          PATIENT TYPE:  INP   LOCATION:  5709                         FACILITY:  [facility]   PHYSICIAN:  Michelene Gardener, MD    DATE OF BIRTH:  12-11-1980   DATE OF ADMISSION:  01/01/2007  DATE OF DISCHARGE:  01/16/2007                               DISCHARGE SUMMARY   PRIMARY CARE PHYSICIAN:  Molly Maduro R. Manus Gunning, M.D.   DISCHARGE DIAGNOSES:  1. Urinary tract infection.  2. Ureteral stone with hydronephrosis.  3. Sacral ulcers.  4. Transverse myelitis with secondary buttock lesion.  5. Status post urostomy tube.  6. Neurogenic bladder secondary to transverse myelitis status post      ileal conduit diversion.   CONSULTATIONS:  1. Neurology consultation.  2. Surgical consultation.   RADIOLOGY STUDIES:  1. Chest x-ray on January 01, 2007 showed no acute abnormality.  2. Chest x-ray on January 03, 2007 showed PICC line with mild airspace      disease in the left lung.  3. CT scan of the abdomen on January 04, 2007 showed 7-mm obstructive      calculus in the distal right ureter at the junction of the urethra      and ileal conduit with multiple bilateral renal calculi with just      chronic infection and small bilateral pleural effusion associated      with mild atelectasis.  4. Pelvic CT without contrast showed large fecal impaction suggesting      constipation, 5-cm right ovarian cyst.  5. Abdominal x-ray on January 06, 2007 showed the same urinary tract      calculi.  6. MR of the lumbar spine showed right obstructive uropathy and      hydronephrosis, hydroureter, and periureteric inflammation, with      normal lumbar spine.  7. Abdominal x-ray on January 07, 2007 showed bilateral renal calculi.  8. Repeat CT scan of the abdomen on January 09, 2007 showed moderate      right hydronephrosis without change, bilateral infrarenal calculi      with mild decrease in the bilateral pleural effusion.  9. Repeat CT at the office on January 09, 2007 showed 7-mm right      ureteral calculus without change and stable moderate right      hydronephrosis, and there was a complex cystic mass with      calcification in the right lower quadrant mesentery which has been      seen since the last MRI in December of 2007.  10.Renal ultrasound on January 11, 2007 showed moderate right      hydronephrosis with no change and bilateral renal calculi.   HOSPITAL COURSE:  This is a 30 year old Caucasian female known for  transverse myelitis and paraplegia that was complicated with sacral  decubitus in addition to neurogenic bladder status post ileal conduit  diversion.  This patient presented to the hospital on January 01, 2007  complaining of weakness, and she was admitted to the medical floor with  a urine infection.  The patient was treated with antibiotics.  At that  time, she had CT scan of her abdomen and pelvis, and it showed calculi  in her ileal conduit in addition to calculi in the right ureter causing  moderate hydronephrosis.  As mentioned above, this patient received  antibiotics.  She had cultures drawn, and her blood culture on January 01, 2007 showed no growth x2.  Her wound culture from the sacrum showed  multiple organisms but none predominant.  Her urine culture from January 01, 2007 showed Providencia which is sensitive to ciprofloxacin.  The  patient was given antibiotics for enough time.  During her  hospitalization, a urology consultation was done on January 05, 2007.  Since that time, the patient has been followed.  Multiple x-rays and CAT  scan were done to follow her calculi and her hydronephrosis.  Finally,  the patient was decided for outpatient lithotripsy, and that will be  scheduled by Dr. Patsi Sears as an outpatient.  This patient also had a  pelvic mass that was described as a complex cystic mass that arose  either from mesentery or from the appendix.  Surgical consultation  was  done for further evaluation.  [name] previous CAT scan in December of 2007  which compared to this one and no major change was noticed.  When I left  the service, the surgeon still has to decide about what to do with this  mass.  Their initially impression was that the patient either needs  followup CT in few months  or biopsy to determine what kind of mass it  is.   Overall, during the hospitalization, the patient was treated for urinary  tract infection with antibiotics.  She has been evaluated by urology and  has been followed by multiple x-rays and CAT scans to determine her  calculi and her hydronephrosis.  The patient was determined to have an  outpatient procedure with urology, and that will be scheduled by  urology.  She was also evaluated by general surgery for her pelvic mass.  The patient will follow with them as an outpatient.   This summary will cover the period between January 01, 2007 up to January 14, 2007.  The rest of her hospitalization will be dictated by Dr.  Ramiro Harvest who followed her and will discharge her.  Her updated  discharge medications will be dictated by him.   ASSESSMENT TIME:  40 minutes.      Michelene Gardener, MD  Electronically Signed     NAE/MEDQ  D:  01/20/2007  T:  01/20/2007  Job:  [id number]   cc:   Bryan Lemma. Manus Gunning, M.D.

## 2010-08-15 NOTE — H&P (Signed)
NAMEKATHRENE, SINOPOLI NO.:  [id number]   MEDICAL RECORD NO.:  [id number]          PATIENT TYPE:  INP   LOCATION:  0104                         FACILITY:  [facility]   PHYSICIAN:  Ramiro Harvest, MD    DATE OF BIRTH:  1980/07/06   DATE OF ADMISSION:  11/02/2007  DATE OF DISCHARGE:                              HISTORY & PHYSICAL   PRIMARY CARE PHYSICIAN:  Bryan Lemma. Manus Gunning, M.D., [facility] physicians.   NEUROLOGIST:  Marolyn Hammock. Thad Ranger, M.D., [facility].   UROLOGIST:  Sigmund I. Patsi Sears, M.D.   HISTORY OF PRESENT ILLNESS:  [name] is a 30 year old  unfortunate white female with history of MS and a transverse myelitis  resulting in paraplegia, history of recurrent bladder cancer leading to  ileal loop diversion in February 2008, cholelithiasis, who is been  complaining of abdominal pain and back pain for the past 2 weeks.  The  patient stated that she had a CT and MRI done approximately a week ago  prior to admission and was told she had gallstones and an ovarian mass.  Per patient, she was told no surgery was needed for her gallbladder at  the time.  She was put on some pain medications and told if she spiked a  fever or had worsening abdominal pain to go to the emergency department.  The patient presented to the ED with a 1-day history of worsening  suprapubic and bilateral back pain, subjective fevers with temperature  in the 100s, nausea, headache, generalized weakness, cough secondary to  postnasal drip.  The patient denied any chest pain.  No shortness of  breath, no melena, no hematemesis, no hematochezia, no decreased  ileostomy output. No focal neurological symptoms.  No other associated  symptoms.  The patient stated she was recently started on Bactrim 2  weeks ago by her PCP for sacral wound growing out MRSA and was told to  complete a 2-week course. In the ED, the patient was found to have a UA  with negative nitrites, large leukocytes,  21-50 wbc's, 0-2 rbc's, many  bacteria.  Lipase level normal.  CMET unremarkable.  CBC with a white  count of 6.7, hemoglobin of 12.4, hematocrit 36.4, platelets of 245 and  an ANC of 3.7.  We were called to admit the patient for further  evaluation and management.   ALLERGIES:  ASPIRIN, IODINE, LATEX, SHELLFISH.   PAST MEDICAL HISTORY:  1. Multiple sclerosis.  2. 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  3. Gastroesophageal reflux disease.  4. Stage III decubitus ulcer of the sacral region.  5. History of recurrent bladder stones requiring urinary diversion.  6. Obesity.  7. History of situational depression.  8. Neurogenic bladder secondary to transverse myelitis status post      ileal conduit diversion.  9. History of right ureteral stone at the urethroileal junction and      right hydronephrosis status post spontaneous stone passage.  10.Right lower quadrant cystic mass in October 2008.  11.Seasonal allergies.  12.Status post tonsillectomy.  13.Status post wisdom tooth removal.  14.Prior history of suprapubic catheter placement  in 2005.  15.Status post open cystolithotomy with exploratory laparotomy and      ileal loop formation in February 2008 per Dr. Patsi Sears.   HOME MEDICATIONS.:  1. TobraDex ophthalmic ointment 1/4 inch to the left every week on      Mondays.  2. Gabapentin 600 mg p.o. q.6 h.  3. Diazepam 5 mg b.i.d. and 10 mg nightly.  4. Baclofen 20 mg 4 times a day.  5. Centraline 100 mg daily.  6. Multivitamin, women's, 1 tablet daily.  7. Promethazine 25 mg p.o. q.6 h p.r.n., started on October 30, 2007.  8. Zomig 2.5 mg as needed.  9. Ranitidine 150 mg b.i.d.  10.Nasacort AQ 2 sprays in each nostril q.a.m.  11.Darvocet-N 100 one tablet p.o. q.8 h p.r.n., started on October 31, 2007.  12.Septra DS b.i.d. x2 weeks, started 1 week ago secondary to an      infection of the sacral wound, started on October 27, 2007.   SOCIAL HISTORY:  The  patient is chronically disabled and lives at home  with her parents, and the parents care for her at home.  The patient  does not smoke, does not use any alcohol.  No IV drug use.   FAMILY HISTORY:  Father alive at age 43 with diabetes, hypertension,  depression, history of CHF.  Mother alive at age 25 for with a history  of hypertension, hyperlipidemia and asthma. Has one brother alive, age  36, with hyperlipidemia, hypertension, bronchitis and questionable  bipolar disorder.   REVIEW OF SYSTEMS:  As per HPI.   PHYSICAL EXAMINATION:  VITAL SIGNS:  Temperature 98.4, blood pressure  85/55, up to 92/64, pulse of 86, respiratory rate 18, saturation 99% on  room air.  GENERAL: The patient is a pleasant female on gurney in no apparent  distress.  HEENT: Normocephalic, atraumatic.  Pupils equal, round and reactive to  light.  Extraocular movements intact.  Oropharynx is clear, no lesions,  dry mucous membranes.  NECK:  Supple.  No lymphadenopathy.  RESPIRATORY:  Lungs are clear to auscultation bilaterally.  No wheezes,  no rhonchi.  CARDIOVASCULAR:  Regular rate and rhythm.  No murmurs, rubs or gallops.  ABDOMEN:  Soft, some tenderness to palpation in the suprapubic region.  Positive bowel sounds. Positive ileal conduit stoma with yellow urine.  Positive CVA tenderness, right greater than left.  EXTREMITIES:  No clubbing, cyanosis or edema.  NEUROLOGICAL:  The patient is alert and oriented x3.  The patient is  paraplegic. Cranial nerves II-XII are grossly intact.  The patient had  does not have any sensation below the waist.   LABORATORY DATA:  Urine pregnancy negative.  Sodium 141, potassium 3.8,  chloride 107, bicarb 24, BUN 16, creatinine 0.62, glucose of 97, calcium  9.6, albumin 4.1, bilirubin 0.6, alkaline phosphatase 83, AST 19, ALT  16, protein 6.8. Lipase of 23.  CBC:  White count 6.7, hemoglobin of  12.4, hematocrit of 36.4, ANC of 37.  UA:  Yellow, cloudy, specific  gravity  1.019, pH of 7.5, glucose negative, bilirubin negative, ketones  negative blood moderate, protein 30, urobilinogen 1, nitrite negative,  leukocytes large.  Microscopic:  21-50 wbc's, 0-2 rbc's, bacteria many.   Abdominal ultrasound:  Cholelithiasis, but no sonographic findings for  acute cholecystitis, normal-caliber common bile duct stone, limited  examination of the pancreas.   ASSESSMENT AND PLAN:  [name] is an unfortunate 30 year old  female with history of multiple sclerosis, history  of transverse  myelitis leading to paraplegia, history of recurrent bladder stones  leading to a ileal loop diversion in February 2008, who presents to the  ED with fevers, worsening suprapubic abdominal pain, right greater than  left, costovertebral tenderness, and found to have a urinary tract  infection on a urinalysis.  1. Sepsis likely secondary to pyelonephritis. We will let check blood      cultures x2.  Check a urine culture.  Check a chest x-ray.  We will      check a wound culture of the sacral decubitus ulcer. Place on      empiric antibiotics of Rocephin. Continue home regimen of Bactrim      DS b.i.d. which was already started per primary care physician.      Fluid resuscitate with IV fluids and monitor.  2. Probable pyelonephritis in patient with low-grade fevers at home,      positive urinary tract infection on urinalysis, positive CVA      tenderness, right greater than left. The patient has hypotension as      well.  We will check blood cultures x2.  Check urine cultures.      Check coags.  Treat with IV Rocephin. Hydrate with IV fluids. Await      culture results.  Pain management.  3. Hypotension, likely secondary to sepsis versus hypovolemia versus      hemorrhagic, which is unlikely with no evidence of acute      gastrointestinal bleed with a normal hemoglobin of 12.4, versus      cardiac, which is unlikely as patient is asymptomatic.  Will check      blood cultures x2.   Check a urine culture.  Check a chest x-ray.      Check coags.  Check wound cultures. Aggressive IV fluid      resuscitation and monitor for volume overload.  We will place a      PICC line, and I will follow.  4. Multiple sclerosis.  5. Transverse myelitis leading to paraplegia. Baclofen, gabapentin,      and diazepam.  6. Wound cultures,  questionable community-acquired methicillin-      resistant Staphylococcus aureus per patient. Will check wound      cultures and continue home -dose Bactrim regimen.  7. Gastroesophageal reflux disease.  Protonix.  8. Stage III decubitus ulcer. Will check wound cultures, wound care      consultation, continue Bactrim.  9. Depression. Sertraline.  10.Migraine.  Zomig as needed.  11.Dehydration. IV fluids.  12.Prophylaxis.  Protonix for GI prophylaxis, heparin for DVT      prophylaxis.   It has been a pleasure taking care of Ms. Joann Meyers.      Ramiro Harvest, MD  Electronically Signed     DT/MEDQ  D:  11/02/2007  T:  11/02/2007  Job:  [id number]   cc:   Bryan Lemma. Manus Gunning, M.D.  Fax: [phone]   Marolyn Hammock. Thad Ranger, M.D.  Fax: [phone]   Sigmund I. Patsi Sears, M.D.  Fax: [phone]

## 2010-08-15 NOTE — H&P (Signed)
[name], [name] NO.:  [id number]   MEDICAL RECORD NO.:  [id number]          PATIENT TYPE:  INP   LOCATION:  5528                         FACILITY:  [facility]   PHYSICIAN:  Hollice Espy, M.D.DATE OF BIRTH:  [birth year]   DATE OF ADMISSION:  01/20/2008  DATE OF DISCHARGE:                              HISTORY & PHYSICAL   PRIMARY CARE [place]loop:  Bryan Lemma. Manus Gunning, M.D.   CONSULTANTS ON THIS CASE:  [facility] and [facility].   CHIEF COMPLAINT:  Abdominal pain.   HISTORY OF PRESENT ILLNESS:  The patient is a 30 year old white female  with past medical history of transverse myelitis leading to  secondary  paraplegia, neurogenic bladder status post ileal conduit as well as  anxiety and depression, who has had abdominal pain for the last month.  She has been evaluated and a recent MRI and CT with ultrasound showed  gallstones, but no evidence of any gallbladder distention or obstructed  liver disease.  The patient was evaluated on multiple fronts to  ensure  that her abdominal pain was not from other sources.  She was evaluated  by Urology,  who found no evidence of any ureteral obstruction as well  as Gynecology who found no evidence of any cysts, which the patient has  had a  previous history of.  Dr. Randa Evens from [facility]  felt strongly that likely this was from her gallbladder and it was  arranged to make the patient a direct admission so that she could be  further evaluated.  The patient currently is doing okay.  She currently  denies any headaches, vision changes, dysphagia.  She complains of some  mild abdominal pain, but nothing severe.  No hematuria or dysuria.  No  diarrhea.  She has problems with chronic constipation.  She has a  history of paraplegia secondary to transverse myelitis.   REVIEW OF SYSTEMS:  Otherwise negative.   PAST MEDICAL HISTORY:  Includes:  1. GERD.  2. History of decubitus ulcers.  3. She is reporting history of multiple sclerosis, although this      reportedly is not an accurate, true diagnosis.  4. She had a history of transverse myelitis causing lower extremity      paraplegia.  5. History of recurrent bladder stones requiring urinary diversion.  6. History of depression.  7. History of neurogenic bladder secondary to transverse myelitis      status post ileal conduit.  8. History of seasonal allergies.  9. Tonsillectomy.  10.History of suprapubic catheter 2005.  11.History of exploratory laparotomy and ileal loop formation as per      Urology.   MEDICATIONS:  Based on her last discharge summary from several months  ago:  1. She is on Neurontin 600 four times daily.  2. Baclofen 20 four times daily.  3. Darvocet-N 100 one tablet p.o. every 8 hours p.r.n.  4. Nasacort p.r.n.  5. Phenergan 25 every 6  p.r.n.  6. Zoloft 100 daily.  7. Valium 5 b.i.d. +10  nightly.  8. Multivitamin daily.  9. TobraDex ophthalmic ointment 1/4 inch  to left eye b.i.d.  10.Aquasol to sacral region daily.   ALLERGIES:  IODINE, ASPIRIN AND LATEX.   SOCIAL HISTORY:  No tobacco, alcohol or drug use.   FAMILY HISTORY:  Noncontributory.   PHYSICAL EXAM:  The patient has just arrived to the floor.  VITALS:  Are pending.  HEENT: Normocephalic atraumatic.  Mucous membranes are slightly dry.  She has no carotid bruits.  HEART:  Regular rate and rhythm.  S1, S2.  2/6 systolic ejection murmur.  LUNGS:  Clear to auscultation bilaterally but limited secondary body  habitus.  ABDOMEN:  Soft, obese, minimal tenderness, although more generalized and  nonfocal.  EXTREMITIES:  Show no clubbing, cyanosis.  Trace pitting edema.   LABORATORY WORK:  We have put in for a UA, CBC and CMET all of which are  pending.   </   ASSESSMENT/PLAN/:  1. Abdominal pain.  Dr. Randa Evens from Gastroenterology is planning on      doing an upper endoscopy on the patient tomorrow.  As noted, she      does  have gallstones, so we will go ahead and check a HIDA scan to      see if there is any decreased ejection fraction of the gallbladder      giving further evidence of whether or not she will need a      cholecystectomy.  We will also consult Surgery.  2. History of transverse myelitis causing paraplegia.  Continue      baclofen.  3. History of depression.  Continue Zoloft.  4. History of cholecystitis.  See above.      Hollice Espy, M.D.  Electronically Signed     SKK/MEDQ  D:  01/20/2008  T:  01/20/2008  Job:  [id number]   cc:   Fayrene Fearing L. Malon Kindle., M.D.  Sigmund I. Patsi Sears, M.D.  Bryan Lemma. Manus Gunning, M.D.

## 2010-08-15 NOTE — Consult Note (Signed)
Veronica, [name] NO.:  [id number]   MEDICAL RECORD NO.:  [id number]          PATIENT TYPE:  INP   LOCATION:  5528                         FACILITY:  [facility]   PHYSICIAN:  Angelia Mould. Derrell Lolling, M.D.DATE OF BIRTH:  [birth year]   DATE OF CONSULTATION:  01/21/2008  DATE OF DISCHARGE:                                 CONSULTATION   CONSULTING SURGEON:  Angelia Mould. Derrell Lolling, MD   REQUESTING PHYSICIAN:  Dr. Randa Evens.   PRIMARY CARE PHYSICIAN:  Bryan Lemma. Manus Gunning, MD   UROLOGISTLynelle Smoke I. Patsi Sears, MD   REASON FOR CONSULTATION:  Known gallstones, weight loss, and  questionable biliary etiology/chronic nausea.   HISTORY PRESENT ILLNESS:  [name] is a 30 year old female patient with  multiple sclerosis and subsequent paraplegia secondary to transverse  myelitis.  She is nonambulatory.  She was initially seen in consultation  by [facility] service in October 2008, by Dr. Derrell Lolling for right lower quadrant  cystic mass.  It was felt that this was ovarian etiology.  Subsequently,  the patient is followed up with GYN.  The mass has gone and it was felt  that this was a cyclic etiology to these ovarian cysts.  The patient had  a longstanding history of chronic nausea of uncertain etiology.  She has  had gallstones for long time as well and remote and recent study showed  no evidence of obstructive disease or acute cholecystitis.  The patient  was readmitted to the hospital yesterday because of recurrent  postprandial nausea and abdominal pain, reported as postprandial nausea  and concern of a biliary etiology to the symptoms.   REVIEW OF SYSTEMS:  GI:  The patient feels discomfort mainly in the  lower abdomen over the suprapubic region somewhat in the left lower  quadrant and in the midabdomen.  The pain is usually pretty constant and  very mild.  Does not change with ingestion or diet.  She has not had any  reflux symptoms or any epigastric pain.  She has chronic nausea, which  is very persistent.  Sometimes, this nausea improves with eating.  Sometimes if she eats too much, she feels more nauseous.  No actual pain  after eating.  She reports chronic constipation with a variable bowel  movement pattern not definite daily BM, but sometimes, she must use a  laxative product to have a bowel movement.  She is currently not on any  daily stool softener or regular MiraLax.  Otherwise, all review of  systems categories are noncontributory at this time.   SOCIAL HISTORY:  She is disabled.  She lives with her mother.  No  alcohol.  No tobacco.  She is nonambulatory.   FAMILY MEDICAL HISTORY:  Noncontributory.   PAST MEDICAL HISTORY:  1. Multiple sclerosis.  2. 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  3. Prior history of stage III sacral decubitus.  4. Obesity, mainly central abdomen.  The patient appears to have lost      weight as compared to when I saw her a year ago.  5. Situational depression.  6. Cyclic ovarian cyst.  7.  Recurrent UTIs and bladder stones.  8. Chronic obstipation.   PAST SURGICAL HISTORY:  1. Tonsillectomy as a child.  2. Wisdom teeth removal.  3. Prior suprapubic catheter in 2005.  4. Open cystolithotomy with exploratory laparotomy and ileal loop in      February 2008, by Dr. Patsi Sears.   ALLERGIES:  1. IODINE.  2. ASPIRIN.   CURRENT MEDICATIONS:  Upon arrival to the hospital include daily  MiraLax, Neurontin, Valium, Lioresal, Zoloft, multivitamins.  She is  scheduled to give a pneumococcal and influenza vaccine as well, Darvocet  and Phenergan, Vicodin and morphine.  These are similar to the patient's  medications at home.   PHYSICAL EXAMINATION:  GENERAL:  Pleasant, but chronically ill appearing  patient who was concern about possible complications related to chronic  gallstones.  VITAL SIGNS:  Temperature 97.4, BP 94/63, pulse 92 and regular, and  respirations 20.  PSYCH:  The patient's affect is  somewhat flat, but she is alert and  oriented x3.  NEUROLOGIC:  The patient again has lower extremity paraplegia, chronic.  Upper extremities are weak and strength about 1/5.  Hands have flexor  dystonia and wasting.  She has gross motor extremity movement in the  upper extremities only.  Sensation is intact in the upper extremities.  Cranial nerves II-XII are otherwise grossly intact.  CHEST:  Bilateral lung sounds are clear to auscultation.  Respiratory  effort is nonlabored.  She is on room air.  CARDIOVASCULAR:  Heart sounds are S1 and S2.  No rubs, murmurs, clicks,  or gallops.  No JVD.  She has dependent lower extremity edema, which is  soft bilaterally.  She is non tachycardiac.  ABDOMEN:  Soft, slightly distended, somewhat obese, nontender to my  palpation.  No obvious hernias noted.  She has a midline surgical scar.  She has an ileal conduit in the right abdomen with the bag in place over  stoma with cloudy yellow fluid draining.  EXTREMITIES:  Symmetrical in appearance, except for the difference in  the upper and lower extremities.  The upper extremities as noted are  weakened and atrophic with wasting and dystonia, secondary to multiple  sclerosis.  Lower extremities are dysfunctional, insensate, and has had  dependent edema.  SKIN:  I did not assess skin thoroughly, did not check the prior  decubitus mentioned.   LABORATORY DATA:  Urinalysis looks consistent with possible UTI.  The  patient also has an ileal conduit.  Sodium 140, potassium 4.4, CO2 of  28, glucose 87, BUN 11, creatinine 0.45, total bilirubin 0.7, alkaline  phosphatase 96, AST 18, ALT 15, white count 8400, hemoglobin 13.6, and  platelets 235,000.   DIAGNOSTICS:  CT of the abdomen and pelvis performed December 12, 2007,  shows multiple gallstones without evidence of acute cholecystitis.  Ultrasound of the abdomen in August 2009, shows multiple gallstones  within the gallbladder wall without gallbladder  wall thickening.  Duct  is normal at 4.8 mm.   IMPRESSION:  1. Chronic nausea, questionable etiology, suspect multifactorial.  2. Multiple gallstones without evidence of cholecystitis, ruled out      biliary dyskinesia.  3. Multiple sclerosis with associated paraplegia secondary to      transverse myelitis history.  4. 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  5. Ileal conduit with recurrent urinary tract infections and bladder      stones.   PLAN:  1. Agree with EGD as  planned out per GI.  They planned to follow with      a nuclear med gastric emptying study.  If this is inconclusive, we      will probably need a HIDA scan with an ejection fraction to      determine if the patient biliary dyskinesia.  2. An HIDA scan has already been ordered by Internal Medicine.  We      will cancel for now and allow GI to finish workup, when we ordered      again, we will need 80% to check a biliary dyskinesia.  3. We will go ahead and check a KUB to determine the degree of colonic      and possible small bowel obstipation.  They have already started      MiraLax on the patient.  We will start Colace t.i.d.  May need      MiraLax up to b.i.d. and possible other agents added.  She will      probably need to remain on these indefinitely.  4. Any other additional instructions and recommendations per Dr.      Derrell Lolling.      Revonda Standard L. Rennis Harding, N.P.      Angelia Mould. Derrell Lolling, M.D.  Electronically Signed    ALE/MEDQ  D:  01/21/2008  T:  01/21/2008  Job:  [id number]   cc:   Dr. Rhae Hammock R. Manus Gunning, M.D.  Sigmund I. Patsi Sears, M.D.

## 2010-08-15 NOTE — H&P (Signed)
[name], [name] NO.:  [id number]   MEDICAL RECORD NO.:  [id number]          PATIENT TYPE:  INP   LOCATION:  3728                         FACILITY:  [facility]   PHYSICIAN:  Michiel Cowboy, MDDATE OF BIRTH:  1980/04/25   DATE OF ADMISSION:  11/13/2007  DATE OF DISCHARGE:                              HISTORY & PHYSICAL   PRIMARY CARE Melana Hingle:  Bryan Lemma. Ehinger, M.D.   CHIEF COMPLAINT:  Nausea, vomiting, fever, some diarrhea, and  generalized abdominal pain.   HISTORY OF THE PRESENT ILLNESS:  The patient is a 30 year old female  with a history of multiple sclerosis with resulting paraplegia.  She was  recently hospitalized for a UTI and possibly early sepsis. She received  antibiotics and was treated with a two-weeks course of Rocephin/Ceftin.  The patient was discharged home on November 10, 2007 and for the past two  days she has been struggling with recurrent nausea and vomiting, and now  has developed some diarrhea as well as generalized abdominal and the  feeling that she has a urinary tract infection again.  She was taken off  the medications she was prescribed.  She reports that she has had  frequent urinary tract infections in the past.  She has an ileostomy.   PAST MEDICAL HISTORY:  The past medical history includes:  1. Frequent urinary tract infections with an ileostomy.  2. History of multiple sclerosis.  3. History of transverse myelitis and paraplegia.  4. History of MRSA sacral decubitus ulcer.  5. History of GERD.  6. History of depression.  7. History of cholelithiasis without signs of acute cholecystitis.   REVIEW OF SYSTEMS:  The review of systems is as per the HPI.  Per the  family the patient has a mild.  Her ulcerative base looks very clean.   SOCIAL HISTORY:  The patient lives at home.  She does not abuse drugs.  She does not drink or smoke.   FAMILY HISTORY:  The family history is noncontributory.   ALLERGIES:  The patient has  allergies to ASPIRIN, IODINE, LATEX,   MEDICATIONS:  1. TobraDex to both eyes once a day.  2. Neurontin 600 mg by mouth every 4 hours.  3. Diazepam 5 mg twice a day and 10 mg at bedtime.  4. Baclofen 20 mg by mouth four times a day.  5. Meserpidine 100 mg by mouth once a day.  6. Zomig 2.5 mg as needed.  7. Ranitidine 150 mg by mouth twice a day.  8. Nasal Cortrosyn.  9. __________ sodium twice a day.  10.Darvocet as needed.  11.The patient is also taking Ceftin antibiotic.   PHYSICAL EXAMINATION:  VITAL SIGNS:  Temperature 99.1.  On arrival to  the ED blood pressure was 96/66 and is now up to 102/73.  Her on arrival  was 15 and is now 1.  Respirations 16.  Satting 95% on room air.  GENERAL APPEARANCE:  The patient appears to be in no acute distress, but  is chronically ill.  HEENT:  The head is atraumatic.  Dry mucous membranes.  LUNGS:  The  lungs are clear to auscultation bilaterally.  HEART:  The heart has a regular rate and rhythm.  No murmurs, rubs or  gallops.  ABDOMEN:  The patient has very poor sensation there, but it seems to be  soft.  EXTREMITIES:  The lower extremities are without edema.  GENITALIA:  Urostomy with somewhat cloudy urine in the bag.  NEUROLOGIC EXAMINATION:  Neurologically she is unchanged from prior  admission.  The patient cannot move both lower extremities.  She has  some sensation in the right lower extremity and has a problem moving her  left upper extremity.   LABORATORY DATA:  White blood cell count 6.7 and hemoglobin 13.3.  Sodium 137, potassium 3.7, creatinine 1.6 and BUN 12.  UA shows bacteria  and a white blood cell count.   ASSESSMENT AND PLAN:  This is a 30 year old female with recurrent  urinary tract infections who originally appeared to be dehydrated and  somewhat hypotensive.  Per review of her records while hospitalized her  blood pressure remained in the mid 90s, which may be her baseline.  1. Recurrent urinary tract infections.   We will admit her since the      patient clinically seems to be having another urinary tract      infection.  This time we will try using Cipro.  Per review of      records over time her urine grows multiple strands and they seem to      be not a clear catch.  We will see what the results of this urine      culture shows, but would recommend trying to obtain a cleaner      sample.  The patient could be potentially colonized with multiple      strands given that she does have an ileostomy.  Also given the      patient's recurrent urinary tract infections and fevers we will      obtain a renal ultrasound to evaluate for perinephric abscess or      for any evidence of hydronephrosis.  We will perform frequent vital      signs and monitor her on telemetry for 24 hours.  2. History of multiple sclerosis.  Continue home medications.  3. History of gastroesophageal reflux disease.  We will give her      Protonix.  4. History of decubitus ulcer.  We will perform wound care. If the      patient appears to be sick I might add vancomycin.  The ulcer      dressing is being changed and I will attempt to examine it.  5. Also as part of the workup for fevers we will obtain a chest x-ray  6. Prophylaxes.  Lovenox and Protonix.  7. Diarrhea.  Since the patient has been in the hospital recently we      will send stool for Clostridium difficile, stool culture and a      stool for a white blood cell count.      Michiel Cowboy, MD  Electronically Signed     AVD/MEDQ  D:  11/14/2007  T:  11/14/2007  Job:  [id number]   cc:   Bryan Lemma. Manus Gunning, M.D.

## 2010-08-15 NOTE — H&P (Signed)
NAMEHELON, WISINSKI NO.:  [id number]   MEDICAL RECORD NO.:  [id number]          PATIENT TYPE:  OBV   LOCATION:  2922                         FACILITY:  [facility]   PHYSICIAN:  Michiel Cowboy, MDDATE OF BIRTH:  [birth year]   DATE OF ADMISSION:  02/16/2008  DATE OF DISCHARGE:                              HISTORY & PHYSICAL   PRIMARY CARE Lizette Pazos:  Bryan Lemma. Manus Gunning, M.D.   CHIEF COMPLAINT:  Syncope.   Ms. Kempner is a 30 year old female with a history of recurrent urinary  tract infections, who had abdominal pain, fever, chills, and decreased  p.o. intake for the past day or so.  Today when she transferred herself  out of bed to her wheelchair, she started to see spots and then  reportedly passed out.  Her mom had trouble waking her up, although she  was staring into space but not responsive.  No seizure-like activity was  noted.  The patient came about after a minute but was not postictal.  No  somnolence post this event was noted. Patient did endorse a feeling of  presyncope prior to this event.  Otherwise, she has had some fevers,  severe nausea, right lower quadrant and suprapubic pain for about 24  hours now.  Otherwise, review of systems is significant for fevers,  chills, otherwise unremarkable.   PAST MEDICAL HISTORY:  1. Gastric ulcer.  2. Urinary tract infections, recurrent.  3. Constipation.  4. Depression.  5. GERD.  6. History of decubitus ulcer.  7. Transverse myelitis leading to all-extremity paraplegia.  8. Bladder stones requiring urinary diversion.  9. Depression.  10.Neurogenic bladder.  11.History of right lower quadrant cystic mass.  12.Seasonal allergies.  13.Status post tonsillectomy.  14.Status post ileal loop formation by Dr. Patsi Sears.   SOCIAL HISTORY:  Patient does not smoke or drink.  Lives at home with  her parents.  Is debilitated.   FAMILY HISTORY:  Noncontributory.   ALLERGIES:  ASPIRIN, IODINE, and LATEX.  Of  note, ASPIRIN is not a true  allergy.  Patient's mother was reportedly allergic to ASPIRIN while  pregnant with the patient.  Otherwise, the patient probably does not  have a true allergy to aspirin.   MEDICATIONS:  1. Protonix 40 mg daily.  2. Neurontin 600 mg p.o. q.i.d.  3. Baclofen 20 mg p.o. q.i.d.  4. Zoloft 100 mg p.o. daily.  5. Multivitamin.  6. Phenergan as needed.  7. Colace 100 mg p.o. b.i.d.  8. Valium 5 mg p.o. b.i.d. and 10 mg nightly.   PHYSICAL EXAMINATION:  VITALS:  Temperature 101.4, blood pressure  115/74, pulse 102, respirations 20, satting 99% on room air.  Patient appears to be in no acute distress, feeling okay, talkative.  Head nontraumatic.  Somewhat dry mucous membranes.  LUNGS:  Clear to auscultation bilaterally.  HEART:  Regular rate and rhythm.  No murmurs, rubs or gallops.  Slightly  rapid.  LOWER EXTREMITIES:  Without edema, clubbing or cyanosis.  ABDOMEN:  There is a ileal conduit ostetomy.  It appears to be  noninfective.  There is right lower quadrant tenderness as  well as  suprapubic tenderness.  Otherwise unremarkable.  No evidence of acute  abdominal signs.  No guarding.  No rebound tenderness.  NEUROLOGIC:  Moving upper extremities without difficulties.  Is  difficult to do lower extremity movement.   LABS:  White blood cell count 13.4, hemoglobin 13.7.  Sodium 137,  potassium 4.1, creatinine 0.49, lipase 23.  Urine positive for white  blood cell count 21-50.   Chest x-ray is unremarkable.   ASSESSMENT/PLAN:  1. This is a 30 year old female with urinary tract infection and      fever.  Given her recurrent urinary tract infections in the past,      she also reportedly had an early sepsis.  Due to fever, she had      frequent admissions.  We will admit for observation right now and      watch patient on telemetry.  Do frequent vitals to make sure      patient does not develop source of sepsis.  Currently appears to be      nontoxic.   Will obtain blood and urine culture.  Start her on      Rocephin, given right lower quadrant pain.  Will obtain a CT of the      abdomen to rule out appendicitis.  Will cover for right now      Rocephin for urinary tract infection and continue to monitor.  2. History of spasticity secondary to transverse myelitis:  Will      continue home meds.  3. History of sacral decubitus:  Will write for wound care consult.  4. History of gastroesophageal reflux disease:  Continue Protonix.  5. Prophylaxis:  Protonix with Lovenox.      Michiel Cowboy, MD  Electronically Signed     AVD/MEDQ  D:  02/16/2008  T:  02/17/2008  Job:  [id number]   cc:   Bryan Lemma. Manus Gunning, M.D.

## 2010-08-15 NOTE — Discharge Summary (Signed)
NAMELAURIEL, [name] NO.:  [id number]   MEDICAL RECORD NO.:  [id number]          PATIENT TYPE:  INP   LOCATION:  3728                         FACILITY:  [facility]   PHYSICIAN:  Ramiro Harvest, MD    DATE OF BIRTH:  07-21-1980   DATE OF ADMISSION:  11/14/2007  DATE OF DISCHARGE:  11/18/2007                               DISCHARGE SUMMARY   PRIMARY CARE PHYSICIAN:  Dr. Manus Gunning of [facility].   DISCHARGE DIAGNOSES:  1. 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  2. Hypotension.  3. Viral gastroenteritis.  4. Gastroesophageal reflux disease.  5. Decubitus ulcer.  6. Multiple sclerosis.  7. Transverse myelitis leading to lower extremity paraplegia.  8. History of recurrent bladder stones requiring urinary diversion.  9. History of situational depression.  10.Neurogenic bladder secondary to transverse myelitis status post      ileal conduit diversion.  11.History of right urethral stone at the urethroileal junction and      right hydronephrosis, status post spontaneous stone passage.  12.Right lower quadrant cystic mass October 2008.  13.Seasonal allergies.  14.Status post tonsillectomy.  15.Status post wisdom tooth removal.  16.Prior history of suprapubic catheter placement in 2005.  17.Status post open cystolithotomy with exploration, exploratory      laparotomy and ileal loop formation in February 2008 per Dr.      Patsi Sears.   DISCHARGE MEDICATIONS:  1. Nitrofurantoin 100 mg p.o. b.i.d. x14 days.  2. Neurontin 600 mg p.o. q.i.d.  3. Baclofen 20 mg p.o. q.i.d.  4. Darvocet-N 100 one tablet p.o. every 8 hours p.r.n.  5. Nasacort AQ p.r.n.  6. Phenergan 25 mg every 6 hours p.r.n.  7. Zoloft 100 mg p.o. daily.  8. Valium 5 mg p.o. b.i.d. 10 mg p.o. nightly.  9. Multivitamin 1 tablet p.o. daily.  10.TobraDex ophthalmic ointment 1/4 inch to the left eye b.i.d.  11.Aquasol  to the sacral region daily.   DISPOSITION AND  FOLLOWUP:  The patient will be discharged home.  The  patient is to follow up with primary care [name] in 2 weeks.  On  followup, a repeat urinalysis will need to be done for resolution of the  enterococcus urinary tract infection and also to note that the patient  could possibly also be contaminated with a history of recurrent urinary  tract infections.  The patient's blood pressure will also need to be  reassessed on followup.   CONSULTATIONS DONE:  None.   PROCEDURES PERFORMED:  1. Chest x-ray was performed on November 14, 2007 which showed no acute      chest process.  2. Renal ultrasound was performed on November 14, 2007 which showed no      acute finding by renal ultrasound.  Negative for hydronephrosis.  3. Acute abdominal series was done on November 14, 2007 which showed no      acute finding per plain radiology.   BRIEF ADMISSION HISTORY AND PHYSICAL:  [name] is an 30-year-  old female with history of multiple sclerosis with resulting paraplegia  recently hospitalized for urinary  tract infection and possible early  sepsis who received antibiotics and was treated with a 2-week course of  Rocephin/Ceftin.  The patient was discharged home on November 10, 2007 and  for the past 2 days the patient had been struggling with recurrent  nausea and vomiting, and developed some diarrhea as well as generalized  abdominal pain and a feeling that she had a urinary tract infection  again.  The patient was taken off her medications that she was  prescribed.  The patient reports that she has frequent urinary tract  infections in the past and has had a ileostomy done.   PHYSICAL EXAM ON ADMISSION:  Temperature 99.1, blood pressure of 96/66,  then up to 102/73.  Pulse was 101 down to 85.  Respiratory rate of 16,  satting 95% on room air.  GENERAL: The patient appeared to be in no acute distress but chronically  ill.  HEENT: Normocephalic, atraumatic.  Pupils equal, round and reactive  to  light.  Extraocular movements intact.  Oropharynx was clear.  No  lesions.  No exudates.  Dry mucous membranes.  RESPIRATORY:  Lungs are clear to auscultation bilaterally.  CARDIOVASCULAR: Regular rate and rhythm.  No murmurs, rubs or gallops.  ABDOMEN:  Decreased sensation but soft and non tender, positive bowel  sounds.  EXTREMITIES: No clubbing, cyanosis or edema.  GENITALIA: With a urostomy, with somewhat cloudy urine in the bag.  NEUROLOGICAL EXAM:  The patient neurologically was unchanged from her  prior admission.  The patient cannot move both lower extremities, had  some sensation to the right lower extremity and has a problem moving her  left upper extremity.   ADMISSION LABORATORIES:  CBC: White count 6.7, hemoglobin 13.3, sodium  137, potassium 3.7, creatinine 1.6, BUN of 12.  UA showed some bacteria  and some WBCs.   HOSPITAL COURSE:  1. Recurrent urinary tract infection.  The patient was admitted as it      was felt that the patient might be clinically having another      recurrent urinary tract infection.  The patient was placed on IV      Cipro.  A UA with culture and sensitivities was obtained.  The      patient was monitored on telemetry for 24 hours.  A renal      ultrasound was obtained to rule out any perinephric abscess or      hydronephrosis.  Renal ultrasound came back negative for this.      Patient remained afebrile throughout the hospitalization while      urinary cultures were waited on.  The patient's urine cultures      initially preliminary, came back positive for Enterococcus species      and as such, the ciprofloxacin was discontinued.  The patient was      then changed to Augmentin 875 b.i.d.  The patient was also placed      on supportive care with IV fluids.  Cultures of the Enterococcus      species came back resistant to ampicillin and levofloxacin and as      such, the patient's Augmentin was discontinued.  The patient was      placed on  nitrofurantoin 100 mg p.o. b.i.d.  The patient was      discharged home to complete a 2-week course of nitrofurantoin 100      mg b.i.d. with further followup with her primary care [name] as      an outpatient.  The  patient remained stable and in improved      condition throughout the hospitalization.  The patient was hydrated      with IV fluids and the patient was discharged in stable and      improved condition.  2. Hypotension.  During the hospitalization, the patient was noted to      have bouts of hypotension.  The patient was will bouts of      hypotension with systolic blood pressures in the high 80s and low      90s.  The patient was fluid resuscitated with IV fluids and the      patient responded well with IV fluid resuscitation.  By day of      discharge the patient's hypotension had resolved and the patient      was discharged in stable and improved condition.  3. Viral gastroenteritis.  On admission the patient did note some      episodes of diarrhea.  The patient's stools were cultured and      checked.  Patient's stools came back with C. diff negative.      Cultures were also negative.  The patient was placed on supportive      care with IV fluids during the hospitalization and by day of      discharge the patient's viral gastroenteritis had resolved and the      patient was discharged in stable and improved condition.  4. The rest of the patient's chronic medical issues were stable      throughout the hospitalization and patient was maintained on most      of her home regimen for medications.   VITAL SIGNS:  On day of discharge, temperature 98, pulse of 74, blood  pressure 116/76, respiratory rate 18, satting 100% on room air.   DISCHARGE LABORATORIES:  Sodium 139, potassium 4.2, chloride 104, bicarb  27, BUN 5, creatinine 0.32, glucose of 93, calcium of 9.2.  CBC:  White  count 6.4, hemoglobin 10.7, platelets 217, hematocrit 31.5.  It was a  pleasure taking care of  [name].      Ramiro Harvest, MD  Electronically Signed     DT/MEDQ  D:  01/20/2008  T:  01/20/2008  Job:  [id number]   cc:   Bryan Lemma. Manus Gunning, M.D.

## 2010-08-15 NOTE — Consult Note (Signed)
NAMEDANISE, DEHNE NO.:  [id number]   MEDICAL RECORD NO.:  [id number]          PATIENT TYPE:  INP   LOCATION:  2922                         FACILITY:  [facility]   PHYSICIAN:  Sigmund I. 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  She developed chronic pyelonephritis, neurogenic bladder  with stone disease, underwent cystectomy and ileal loop formation.  The  patient has poor eye-hand coordination, urinary incontinence, and  chronic infection.  She currently has recurrent pyelonephritis, but also  has gallstones, and significant sacral decubitus.  She is admitted to  the hospital because of nausea, vomiting, and fainting.  She also had  some right lower quadrant pain, suprapubic pain.   PAST HISTORY:  1. Gastric ulcer.  2. Urinary tract infections, recurrent.  3. Chronic constipation.  4. Depression.  5. GERD.  6. Decubitus ulcer.  7. Transverse myelitis.  8. Bladder stones.  9. Neurogenic bladder.  10.Right lower quadrant cystic mass (ovarian cyst).   Tobacco is none.  Alcohol, none.   FAMILY HISTORY:  Noncontributory.   SOCIAL HISTORY:  The patient lives with her mother and father.  Both the  patient and her father are on disability.  The family lives only on the  disability payment to both the patient and father.  Social service has  evaluated the patient's home situation in the past, and recommended  nursing home placement, but because of significant social difficulties,  the patient continues to live with her parents.  Improvements have been  made to the home, to enable the wheelchair-bound patient to be able to  use the bathroom, etc.   ALLERGIES:  1. ASPIRIN.  2. IODINE.  3. LATEX  4. ASPIRIN.   CURRENT  MEDICATIONS:  1. Protonix 40 mg a day.  2. Neurontin 600 mg q.i.d.  3. Baclofen 20 mg q.i.d.  4. Zoloft 100 mg p.o. per day.  5. Colace 100 mg b.i.d.  6. Valium 5 mg b.i.d. and 10 mg nightly.   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  VITAL SIGNS:  Temperature of 101.4, blood pressure 115/74, pulse 102,  respiratory rate 20, O2 sat 99%.  NECK:  Supple, nontender nodes.  CHEST:  Clear to P&A.  ABDOMEN:  Ileal loop present.  Well-healed abdominal incision.  NEUROLOGIC:  Quadriparesis.  The patient has hand deformities.   CT scan shows the patient has multiple small 2-mm stones in both  kidneys.  She appears to have stones within the ileal loop as well.  Because she is a recurrent stone former, she may be best treated with  Urocit-K 1 b.i.d.  This could be increased to 2 b.i.d.  This medication  can cause nausea, and should be given at the end of the meal.  Note,  with the patient's nausea evaluation and gallstones this admission, she  may have some difficulty  with this medication.  The patient may require  social service intervention for help in continuing medication at home.  If stones can be recovered, then it should be sent for analysis.      Sigmund I. Patsi Sears, M.D.  Electronically Signed     SIT/MEDQ  D:  02/18/2008  T:  02/18/2008  Job:  [id number]   cc:   Bryan Lemma. Manus Gunning, M.D.

## 2010-08-15 NOTE — H&P (Signed)
Veronica, [name] NO.:  [id number]   MEDICAL RECORD NO.:  [id number]          PATIENT TYPE:  INP   LOCATION:  1432                         [facility]ACILITY:  [facility]   PHYSICIAN:  Sigmund I. Patsi Sears, M.D.DATE OF BIRTH:  31-Oct-1980   DATE OF ADMISSION:  02/19/2007  DATE OF DISCHARGE:                              HISTORY & PHYSICAL   CHIEF COMPLAINT:  This is a 30 year old female that is admitted to the  hospital for right ureteral stone at uteroileal junction with right  hydronephrosis.  The patient will be having surgery with Dr. Patsi Sears  and Dr. Fredia Sorrow on 02/20/2007 for right percutaneous nephrostomy and  right percutaneous nephrolithotomy at [facility].   MEDICATIONS:  1. Baclofen 20 mg.  2. Multivitamin daily.  3. Neurontin 600 mg.  4. Nitrofurantoin 100 mg daily.  5. Valium 5 mg.  6. Zoloft 100 mg.  7. Zomig tablets.  8. Zyrtec.   ALLERGIES:  IODINE.   SOCIAL HISTORY:  The patient lives in a difficult physical home  environment with her parents.  She is a 30 year old female that is  wheelchair bound with a history of transverse myelitis for approximately  3 years.  Has poor hand/eye coordination and lives with her mother and  father in a small 3-room house.   FAMILY HISTORY:  Kidney stones in her father.  Diabetes in her father,  depression and hypertension.   REVIEW OF SYSTEMS:  Significant for chronic skin dermatitis secondary to  poor hygiene, significant stage 4 sacral decubitus and abdominal pain  and right flank pain.   Admission physical examination shows a quadriplegic female in no acute  distress.  HEENT:  Negative.  NECK:  Supple, nontender, no nodes.  CHEST:  Clear to P&A.  ABDOMEN:  Soft, mildly obese, mildly obese, positive bowel sounds  without organomegaly or masses.  Ileal is pink and draining clear yellow  urine.  GU:  Normal female external genitalia.  EXTREMITIES:  Severe muscle wasting.  She has flaccidity with no  muscles.  Her upper extremities have slightly more tone but are poorly  coordinated in movement.  SKIN:  Healing dermatitis to her arms and legs and a stage 4 sacral  decubitus.   IMPRESSION:  Right ureteral stone at uteroileal junction with right  hydronephrosis.   PLAN:  Is for her to have right percutaneous nephrostomy and right  percutaneous nephrolithotomy with Dr. Patsi Sears and Dr. Fredia Sorrow.  The  patient will be discharged approximately 48-72 hours after the procedure  and also, while she is in the hospital, she will have a wound ostomy  consult for her sacral wound.      Jetta Lout, NP      Sigmund I. Patsi Sears, M.D.  Electronically Signed    DW/MEDQ  D:  02/20/2007  T:  02/20/2007  Job:  [id number]

## 2010-08-15 NOTE — Discharge Summary (Signed)
Veronica, [name] NO.:  [id number]   MEDICAL RECORD NO.:  [id number]          PATIENT TYPE:  INP   LOCATION:  5507                         FACILITY:  [facility]   PHYSICIAN:  Hollice Espy, M.D.DATE OF BIRTH:  1980/05/09   DATE OF ADMISSION:  11/02/2007  DATE OF DISCHARGE:  11/10/2007                               DISCHARGE SUMMARY   PRIMARY CARE PHYSICIAN:  Molly Maduro R. Manus Gunning, MD   NEUROLOGIST:  Marolyn Hammock. Thad Ranger, MD   UROLOGIST:  Sigmund I. Patsi Sears, MD   DISCHARGE DIAGNOSES:  1. Urinary tract infection.  2. Secondary sepsis.  3. History of multiple sclerosis.  4. History of transverse myelitis.  5. Paraplegia.  6. Methicillin-resistant Staphylococcus aureus, sacral decubitus      ulcer.  7. Gastroesophageal reflux disease.  8. Depression.  9. Cholelithiasis with no signs of acute cholecystitis.   DISCHARGE MEDICATIONS:  1. Ceftin 500 mg p.o. b.i.d. x1 week, this will complete a total of 2      weeks of antibiotic therapy.  The patient will resume all of her      previous medications, these are as follows:  2. Multivitamin p.o. daily.  3. Neurontin 600 p.o. 4 times a day.  4. Baclofen 20 p.o. 4 times a day.  5. Darvocet N 100 p.o. t.i.d. p.r.n.  6. Nasacort AQ p.r.n.  7. Ranitidine 150 p.o. b.i.d.  8. Phenergan 25 p.o. q.6h. as needed for nausea.  9. Bactrim DS p.o. b.i.d.  10.Zoloft 100 p.o. daily.  11.Valium 5 p.o. b.i.d. plus 10 mg nightly.  12.TobraDex ophthalmic ointment one-fourth inch to left eye b.i.d.  13.The patient is also being discharged on Aquasol daily dressing      changes for her sacral decub wound.   HOSPITAL COURSE:  The patient is a 30 year old white female with past  medical history of MS and paraplegia secondary to myelitis who came in  on November 02, 2007, with a large urinary tract infection and hypotension.  She was started on IV fluids, dosed on IV Rocephin, and kept in the step-  down unit.  Initially, there was  a concern about her gallbladder and  abdominal ultrasound showed evidence of chololithiasis, but no signs of  an acute cholecystitis.  The patient was suspected to have probable  pyelonephritis given her positive UTI and flank tenderness, but she had  normal white count.  Over the next several days, she started to show  signs of improvement.  While her blood pressure still remained on the  low end, she continued to improve.  From a urologic standpoint, was able  to be transitioned to floor by November 06, 2007.  At this time, the  patient's white count was normalized and she was showing signs of  improvement.  She was felt to be medically stable for discharge on  November 10, 2007.  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  The bone was palpable with swab, minimal  drainage, and no odor.  She has had previous bed sores.  Plan was to put  Aquasol to absorb drainage and provide antimicrobial benefits.  An air  mattress is already available at home.  The patient has a urostomy pouch  intact with good seal to prevent further infection.  Plan will be for  advanced home care to follow up for wound care.  The patient's other  medial issues were stable, and by November 10, 2007, she was felt to be  ready for discharge.  The patient's discharge diet will be regular diet.  Her activity will be bed rest given her paraplegia, but she may be out  of bed to a chair.   DISPOSITION:  Improved and she is being discharged to home.  She will  follow up with her PCP, Dr. Manus Gunning, this week.      Hollice Espy, M.D.  Electronically Signed     SKK/MEDQ  D:  11/10/2007  T:  11/11/2007  Job:  [id number]   cc:   Bryan Lemma. Manus Gunning, M.D.

## 2010-08-15 NOTE — Discharge Summary (Signed)
[name], CROY NO.:  [id number]   MEDICAL RECORD NO.:  [id number]         PATIENT TYPE:  CINP   LOCATION:                               FACILITY:  [facility]   PHYSICIAN:  Ramiro Harvest, MD    DATE OF BIRTH:  05/24/1980   DATE OF ADMISSION:  01/20/2008  DATE OF DISCHARGE:  01/24/2008                               DISCHARGE SUMMARY   PRIMARY CARE PHYSICIAN:  Molly Maduro R. Manus Gunning, MD, [facility].   DISCHARGE DIAGNOSES:  1. Large gastric ulcer.  2. Urinary tract infection.  3. Constipation.  4. Depression.  5. History of recurrent urinary tract infections.  6. Viral gastroenteritis.  7. Gastroesophageal reflux disease.  8. History of decubitus ulcer.  9. Multiple sclerosis.  10.Transverse myelitis leading to lower extremity paraplegia.  11.History of recurrent bladder stones requiring urinary diversion.  12.History of situational depression.  13.Neurogenic bladder secondary to transverse myelitis, status post      ileal conduit diversion.  14.History of right ureteral stone of the uro-ileal.  15.Right hydronephrosis, status post spontaneous stone passage.  16.Right lower quadrant cystic mass in October 2008.  17.Seasonal allergies.  18.Status post tonsillectomy.  19.Status post wisdom tooth removal.  20.Prior history of suprapubic catheter placement in 2005.  21.Status post open cystolithotomy and exploration.  22.Exploratory laparotomy and ileal loop formation in February 2008      per Dr. Patsi Sears.   DISCHARGE MEDICATIONS:  1. Ceftin 250 mg p.o. b.i.d. x6 days.  2. Protonix 40 mg p.o. daily.  3. Neurontin 600 mg p.o. q.i.d.  4. Baclofen 20 mg p.o. q.i.d.  5. Zoloft 100 mg p.o. daily.  6. Multivitamin daily.  7. Aquacel to sacral region daily.  8. TobraDex ophthalmic ointment a 1/4 inch the left eye b.i.d.  9. Nasacort as needed.  10.Darvocet N 100 p.o. q.8 h. p.r.n.  11.Phenergan 25 mg p.o. q.6 h. p.r.n.  12.Protonix 40 mg p.o. daily.  13.Colace 100 mg p.o. b.i.d.  14.Valium 5 mg p.o. b.i.d. and 10 mg q.h.s.   DISPOSITION AND FOLLOWUP:  The patient will be discharged home.  The  patient is to follow up with Dr. Randa Evens in 1 month.  On followup, the  patient will likely be set up for a redo upper endoscopy.  The patient  is also to follow up with PCP in 2 weeks.  On followup, a repeat  urinalysis will need to be obtained to monitor for resolution of urinary  tract infection.  Consultations done.  1. A Gastroenterology consult was done.  The patient was seen in      consultation by Dr. Randa Evens of [facility] on January 20, 2008.  2. [facility] consult was obtained.  The patient was      seen by Dr. Claud Kelp on January 21, 2008.  3. The biopsy results from endoscopy will need to be followed up.  4. The biopsy results will need to be followed up.   PROCEDURES PERFORMED:  Acute abdominal series was performed on January 21, 2008, that showed a large amount of fecal  matter throughout the  colon.  Gas pattern is otherwise unremarkable.  Chest x-ray was obtained  on January 21, 2008, that showed no acute abnormalities.  An EGD with  biopsy was done on January 21, 2008 that showed a large gastric ulcer in  the woman who has been on chronic ranitidine therapy this maybe the  source of her nausea.   BRIEF ADMISSION HISTORY AND PHYSICAL:  [name] is a 30-year-  old white female with past medical history of transverse myelitis  leading to secondary paraplegia, neurogenic bladder, status post ileal  conduit as well as anxiety and depression, who had abdominal pain for  the past month.  She had been evaluated with a recent MRI and CT with  ultrasound, which showed gallstones, but no evidence of any gallbladder  distention or obstructive liver disease.  The patient was evaluated on  multiple fronts to ensure that her abdominal pain was not from other  sources.  She was evaluated by  Urology, who found no evidence of any  urethral obstruction as well as Gynecology found no evidence of any cyst  which the patient has had a previous history of.  Dr. Randa Evens from [facility] felt strongly that likely this was from her gallbladder  and he was arranged to make the patient a direct admission so that she  could be further evaluated.  The patient is currently doing okay.  The  patient denied any headaches, no visual changes, no dysphagia.  The  patient complained of some mild abdominal pain, but nothing severe.  No  hematuria.  No dysuria.  No diarrhea.  The patient with problems of  chronic constipation.  The patient has had a history of paraplegia  secondary to transverse myelitis.   Physical exam per admitting physician.  HEENT:  Normocephalic, atraumatic.  Mucous membranes slightly dry.  No  carotid bruits.  NECK:  Supple.  No lymphadenopathy.  RESPIRATORY:  Lungs are clear to auscultation bilaterally, but limited  secondary to body habitus.  CARDIOVASCULAR: Regular rate and rhythm, S1 and S2, 2/6 systolic  ejection murmur.  ABDOMEN:  Soft, obese, minimal tenderness, although more generalized and  nonfocal.  EXTREMITIES:  No clubbing or cyanosis.  Trace pitting edema.   Lab work was pending at the time of discharge.   HOSPITAL COURSE:  1. Large gastric ulcer.  The patient had presented with a 54-month      history of abdominal pain.  The patient was consulted on by      Gastroenterology by Dr. Randa Evens on January 20, 2008.  It was felt      that the patient's abdominal pain may be likely secondary to her      gallstones.  The patient was admitted, placed on IV fluids.  A CBC      was obtained, which was within normal limits.  A comprehensive      metabolic profile was also obtained, within normal limits.  A      urinalysis was obtained, which was positive for nitrates and small      leukocytes.  The patient was monitored.  The patient was also seen       in consultation by [facility] by Dr. Claud Kelp      on January 21, 2008.  It was felt that we needed to hold off on      getting a HIDA scan or any scan until the patient had an EGD done  and further workup was done by Gastroenterology.  The patient did      have her EGD done on January 21, 2008, which showed a large gastric      ulcer.  It was felt per Dr. Ramon Dredge that the patient will benefit      from Carafate and PPIs b.i.d.  During the hospitalization as such      the patient's Protonix was increased to 40 mg p.o. b.i.d. and she      was also placed on Carafate.  The patient improved symptomatically      throughout the hospitalization, and by day of discharge, the      patient was stable and improved condition.  The patient will follow      up as an outpatient with Dr. Randa Evens in approximately a month at      which time the patient will probably likely receive a repeat EGD.      Biopsies were taken off the gastric ulcers.  H. Pylori, the CLO-      test was sent, which came back negative.  Pathology was pending at      the time of discharge and this will need to be followed up per PCP      or per Dr. Randa Evens of [facility].  The patient improved      on a daily basis throughout the hospitalization and the patient      will be discharged in stable and improved condition.  2. Urinary tract infection.  It was noted per admission that the      patient did have an urinary tract infection as such the patient was      placed on IV Rocephin.  Urine cultures were obtained.  The patient      remained afebrile throughout the hospitalization.  The patient's IV      Rocephin was monitored and the patient was monitored on her IV      Rocephin.  The patient improved symptomatically and will be changed      to Ceftin 250 mg p.o. b.i.d. to complete a 10-day course of      antibiotics on discharge.  3. Constipation.  The patient was a little constipated on admission,       she was given some tap-water enemas with positive results.  The      patient did have a multiple bowel movements, and by day of      discharge, the patient's constipation had resolved, and she was in      stable and improved condition.  The rest of the patient's chronic      medical issues remained stable throughout the hospitalization, and      the patient will be discharged in stable and improved condition.      Vital signs on day of discharge, temperature 97.5, pulses 75, blood      pressure 103/69, respiratory rate 18, sating 99% on room air.      Discharge labs, sodium 141, potassium 4.3, chloride 105, bicarb 28,      BUN 4, creatinine 0.34, glucose of 84, calcium of 8.5.  It was a      pleasure to taking care of Ms. Jeselle Eye.      Ramiro Harvest, MD  Electronically Signed     DT/MEDQ  D:  01/24/2008  T:  01/25/2008  Job:  [id number]   cc:   Bryan Lemma. Manus Gunning, M.D.  James L. Malon Kindle., M.D.  Angelia Mould.  Derrell Lolling, M.D.  Sigmund I. Patsi Sears, M.D.

## 2010-08-15 NOTE — Op Note (Signed)
NAMECHRISSIE, DACQUISTO NO.:  [id number]   MEDICAL RECORD NO.:  [id number]          PATIENT TYPE:  INP   LOCATION:  5528                         FACILITY:  [facility]   PHYSICIAN:  James L. Malon Kindle., M.D.DATE OF BIRTH:  1980-10-17   DATE OF PROCEDURE:  01/21/2008  DATE OF DISCHARGE:                               OPERATIVE REPORT   PROCEDURE:  Esophagogastroduodenoscopy with biopsy.   MEDICATIONS:  Hurricaine spray, fentanyl 75 mcg, and Versed 7.5 mg IV.   INDICATIONS:  Persistent nausea and vomiting in a complicated patient.   DESCRIPTION OF PROCEDURE:  Procedure was explained to the patient and  consent obtained.  The patient was placed in left lateral decubitus  position.  The Pentax operative scope was inserted blindly in the  esophagus and advanced under direct visualization.  The esophagus was  normal other than marked redness of the distal esophagus.  The stomach  was entered.  There is no active bleeding.  Along the posterior wall,  lesser curve area near the junction of the antrum and body of the  stomach was a large gastric ulcer approximately 1.5 cm surrounded by  very prominent thickened folds.  The pyloric channel was identified and  passed.  The duodenal including the bulb and second portion was normal.  The port channel was normal.  The scope was withdrawn back into the area  of the ulcer and rapid urease test for Helicobacter was obtained.  The  ulcer edges were biopsied.  The scope was withdrawn.  Initial findings  were confirmed.  There are no additional findings.   ASSESSMENT:  Large gastric ulcer in a woman who has been on chronic  ranitidine therapy.  This may well be the source of her nausea.   PLAN:  We will check the results of the test for Helicobacter, placed  her on double dose PPIs and Carafate, and make further recommendations  depending on biopsy report.  At this point in time, I will hold any  further workup of her gallbladder or  gastric emptying, etc., to see how  she does with treating this ulceration.           ______________________________  Llana Aliment Malon Kindle., M.D.     Waldron Session  D:  01/21/2008  T:  01/22/2008  Job:  [id number]   cc:   Lynelle Smoke I. Patsi Sears, M.D.  Bryan Lemma. Manus Gunning, M.D.  Angelia Mould. Derrell Lolling, M.D.

## 2010-08-15 NOTE — Consult Note (Signed)
[name], [name] NO.:  [id number]   MEDICAL RECORD NO.:  [id number]          PATIENT TYPE:  INP   LOCATION:  5709                         FACILITY:  [facility]   PHYSICIAN:  Revonda Standard L. Rennis Harding, N.P. DATE OF BIRTH:  04/27/1980   DATE OF CONSULTATION:  01/14/2007  DATE OF DISCHARGE:                                 CONSULTATION   UROLOGIST:  Sigmund I. Patsi Sears, M.D.   PRIMARY CARE PHYSICIAN:  Angelia Mould. Derrell Lolling, M.D.   REASON FOR CONSULTATION:  Right lower quadrant cystic mass.   HISTORY OF PRESENT ILLNESS:  [name] is a  25-year female patient  with history of multiple sclerosis with subsequent transverse myelitis  resulting in paraplegia.  She has chronic bladder calculi which led to  ileal loop diversion in February of this year.  She was admitted October  1 with fever and apparent UTI.  Subsequent urine cultures were positive  for Providencia rettgeri and pseudomonas aeruginosa.  CT scan done at  admission demonstrated right hydronephrosis and a 7 mm stone in the mid  right ureter.  Eventually the patient was evaluated by urology, Dr.  Vernie Ammons January 05, 2007.  Subsequent repeat renal ultrasound on October  11 showed continued right hydro and bilateral renal calculi.  In  addition to those findings, the CT from January 09, 2007 also showed a  lobulated cystic mass in the right lower quadrant with several mural  calcifications anterior to the ureter.  This area measured 3.2 x 4.8 cm.  When compared to the CT scan from March 28, 2006, it had increased in  size.  The radiologist was questioning whether the patient had a  mucocele of the appendix versus cystic neoplasm  of the mesentery or  bowel cirrhosis.  Surgical consultation has been requested.   REVIEW OF SYSTEMS:  Review of systems as above.  The patient normally  has a bowel movement every 2 days spontaneously without any digital  stimulation.  Since being in the hospital, she has been given  apparent  medications and other treatments to help stimulate bowel activity.  Her  mother states she was impacted and she has been having loose stools even  when she passes flatus.  No definite abdominal pain, but again the  patient is paraplegic.  No further fever since urinary tract infection  was treated.   PAST MEDICAL HISTORY:  1. Multiple sclerosis.  2. Transverse myelitis secondary to lower extremity paraplegia.  3. Stage III decubitus ulcer of the sacral region.  4. History of recurrent of bladder stones requiring urinary diversion.  5. Obesity.  6. History of situational depression.   PAST SURGICAL HISTORY:  1. Tonsillectomy.  2. Wisdom tooth  removal.  3. Prior suprapubic catheter placement in 2005.  4. Open cystolithotomy with exploratory laparotomy and ileal loop      formation in February 2008 by Dr. Patsi Sears.   ALLERGIES:  IODINE.   MEDICATIONS:  Medications at home include gabapentin, nitrofurantoin,  sertraline, baclofen, diazepam, artificial tears, and multiple vitamins.   While here at [facility], the patient has been on her home medications.  In  addition she has been on Protonix, trimplex, Cipro, and Vicodin for  pain.  She has also received Fleet's enemas and other bowel medications.   SOCIAL HISTORY:  The patient again is on chronic disability.  Her  parents are also dependent on the patient's disability check and they  care for the patient at home.  She does not smoke or use alcohol  products.  Also to clarify the decubitus is a stage III decubitus ulcer  on her buttock.   PHYSICAL EXAMINATION:  GENERAL:  Pleasant female patient currently not  having any clinical complaints although she is hoping she can go home  soon.  VITAL SIGNS:  Temperature 91, BP 92/58, pulse 86 and regular,  respirations 20.  NEURO:  The patient has obvious paraplegia on clinical exam with lower  extremity atrophy.  She has upper extremity flexure pronation and  weakness and  difficulty with gross motor movement.  Very little fine  motor activity noted.  She is also noted to have involuntary muscle  spasms of the upper as well as the lower extremities.  She is otherwise  alert and oriented.  HEENT:  Head is normocephalic.  Sclerae noninjected.  NECK:  Supple.  No adenopathy.  CHEST:  Bilateral lung sounds are clear to auscultation.  Respiratory  effort is nonlabored.  CARDIAC:  S1 and S2 without rubs, murmurs, thrills, or gallops.  ABDOMEN:  Soft, obese.  She has midline ileal conduit stoma which has  mucoid and yellow  drainage.  No parastomal hernia.  The midline  incision is well-healed without evidence of herniation as well.  Bowel  sounds are present.  Because for paraplegia, unable to determine if she  is experiencing any abdominal pain.  EXTREMITIES:  Symmetrical in appearance and as per the neurological  exam, there is no lower extremity edema.  Pulses are  palpable and she  has normal lower extremity hair distribution.   LABORATORY DATA:  Sodium 138, potassium 4.1, CO2 26, glucose 92, BUN 9,  creatinine 0.19.  White count 8900, hemoglobin 9.8, platelets 415,000.  Urine culture as noted was greater than 100,000 colonies of Providencia  rettgeri.  which was resistant to ampicillin, cefazolin and Macrodantin  and Pseudomonas aeruginosa which was pansensitive.  Diagnostic CT of the  abdomen and pelvis as noted.   IMPRESSION:  Right lower quadrant cystic mass, etiology uncertain.   PLAN:  I will review the scan with Dr. Ezzard Standing and will probably also  review with radiologist.  Uncertain if there is any additional surgical  intervention needed.  The area has increased in size since the December  CT and the patient may benefit either from continued observation of this  area since it appears to be asymptomatic versus consider obtaining a  biopsy if warranted to rule out possibility of neoplastic etiology.  Additional plans and treatment per Dr. Ezzard Standing  after his evaluation.      [name], N.P.    ALE/MEDQ  D:  01/14/2007  T:  01/15/2007  Job:  [id number]   cc:   Angelia Mould. Derrell Lolling, M.D.  Sigmund I. Patsi Sears, M.D.

## 2010-08-15 NOTE — Discharge Summary (Signed)
NAMEEMILYANN, [name] NO.:  [id number]   MEDICAL RECORD NO.:  [id number]          PATIENT TYPE:  WOC   LOCATION:  WOC                          FACILITY:  [facility]   PHYSICIAN:  Kela Millin, M.D.DATE OF BIRTH:  1980-05-09   DATE OF ADMISSION:  02/16/2008  DATE OF DISCHARGE:  02/20/2008                               DISCHARGE SUMMARY   DISCHARGE DIAGNOSES:  1. Urinary tract infection, gram-negative rods.  2. Urologic calculi.  3. Known cholelithiasis.  4. History of transverse myelitis with paraplegia.  5. History of neurogenic bladder secondary to transverse myelitis and      status post ileal conduit.  6. History of recurrent urinary tract infections.  7. Gastroesophageal reflux disease.  8. History of gastric ulcer.  9. History of bladder stones requiring a urinary diversion.  10.Depression.  11.Constipation.  12.History of decubitus ulcer.  13.History of right lower quadrant cystic mass in October 2008.  14.History of seasonal allergies.  15.Syncope.   PROCEDURES AND STUDIES:  1. CT scan of abdomen and pelvis.  Status post ileal conduit with      calcific densities, 2-3 mm noted in the proximal conduit near the      anastomosis, cyst favored to represent urologic calculi.  The      ureters appeared decreased in size and with left stranding than on      prior exams.  Nonobstructing right nephrolithiasis.      Cholelithiasis.  No acute findings in the pelvis status post      cystectomy.  Interval resolution of left adnexal cyst.  Right      adnexal cyst suspected.  2. Hiatus scan.  Patency of cystic and biliary ducts.   CONSULTATIONS:  Urology, Dr. Patsi Sears.   BRIEF HISTORY:  The patient is a 30 year old female with the above-  listed medical problems who presented following a syncopal episode after  she transferred herself from the bed to her wheelchair.  She reported  that she started seeing spots and then reportedly passed out.  Her mom  had  trouble waking her up, although, she was staring into space.  She  was not responsive.  No seizure-like activity was reported.  No altered  mental status/somnolence noted after this episode.  The patient admitted  to feeling faint prior to this happening.  She admitted to fevers,  nausea and also had been having some right lower quadrant and suprapubic  pain for 24 hours prior to admission.  She was admitted for further  evaluation and management.   Please see the full admission history and physical dictated on 11/16 by  Dr. Adela Glimpse for the details of the admission physical exam as well as  the laboratory data.   HOSPITAL COURSE:  1. Urinary tract infection, gram-negative rods.  Upon admission, blood      and urine cultures were obtained and the patient was empirically      started on IV antibiotics.  The blood cultures are negative to      date.  The patient's urine cultures are growing gram-negative rods.      She  has remained afebrile and her leukocytosis has resolved on the      Rocephin that she is on.  Her last white cell count prior to      discharge is 5.3.  She has not had any further nausea or vomiting,      and her suprapubic pain has resolved, and she is tolerating p.o.'s      well.  The final identification and sensitivities on the gram-      negative rods she is growing in her urine are pending at this time,      but the patient will be discharged on Ceftin and is to follow up      with her primary care physician.  2. Urologic calculi.  As noted above, the patient had a CT scan of her      abdomen and pelvis done following admission, and it showed urologic      calculi, and because it was noted that the ureters size was      decreased from previous imaging studies, Urology was consulted.      Dr. Patsi Sears saw the patient and recommended starting her on      Urocrit.  He noted that one of the side effects of the Urocrit is      nausea, and the patient did have increased  nausea with vomiting      when the medication was given to her before her meal.      Subsequently, the order was clarified for the patient to be given      the Urocrit after meals and this was done, and the patient has done      well without further nausea or vomiting.  The Urocrit was started      at one p.o. b.i.d. and Dr. Patsi Sears indicated that this could be      increased to two b.i.d.  The patient is to follow up outpatient      with her PCP and Dr. Patsi Sears, and the dose could be increased as      appropriate to two p.o. b.i.d.  He also recommended for home health      to follow to ensure that the patient was taking the Urocrit, and if      stones could be recovered and if they were recovered to be sent for      analysis.  3. The patient has known cholelithiasis and while in the hospital      since she had presented with nausea, vomiting, HIDA scan was done      and patency of the cystic and biliary ducts was noted.  4. GERD.  The patient was maintained on her PPI during this hospital      stay.  5. History of transverse myelitis with paraplegia.   DISCHARGE MEDICATIONS:  1. Ceftin 500 mg p.o. b.i.d. times 10 more days.  2. Urocrit K one p.o. b.i.d.  3. The patient to continue preadmission medications:      a.     Zoloft 100 mg daily.      b.     Valium 5 mg one tablet daily and two at bedtime.      c.     Protonix 40 mg daily.      d.     Darvocet one t.i.d. p.r.n.      e.     Baclofen 20 mg one tablet q.i.d.      f.     Promethazine 25  mg q.6 h p.r.n.      g.     Gabapentin one tablet q.i.d.      h.     Zofran 8 mg q.12 h p.r.n.      i.     Fluticasone nasal spray p.r.n.      j.     Senokot as previously.      k.     Dulcolax p.r.n. as previously.   FOLLOW-UP CARE:  1. Dr. Blair Heys in 1-2 weeks.  2. Dr. Patsi Sears, call for appointment upon discharge.  3. Dr. Derrell Lolling, call for follow-up appointment upon discharge.   DISCHARGE CONDITION:   Improved/stable.      Kela Millin, M.D.  Electronically Signed     ACV/MEDQ  D:  02/20/2008  T:  02/20/2008  Job:  [id number]   cc:   Bryan Lemma. Manus Gunning, M.D.  Sigmund I. Patsi Sears, M.D.  Angelia Mould. Derrell Lolling, M.D.

## 2010-08-15 NOTE — H&P (Signed)
[name], [name] NO.:  [id number]   MEDICAL RECORD NO.:  [id number]          PATIENT TYPE:  INP   LOCATION:  1825                         FACILITY:  [facility]   PHYSICIAN:  Hollice Espy, M.D.DATE OF BIRTH:  [birth year]   DATE OF ADMISSION:  01/01/2007  DATE OF DISCHARGE:                              HISTORY & PHYSICAL   PRIMARY CARE PHYSICIAN:  Bryan Lemma. Manus Gunning, M.D.   CHIEF COMPLAINT:  Weakness.   HISTORY OF PRESENT ILLNESS:  The patient is a 30 year old white female  with past medical history of transverse myelitis and secondary lower  extremity paraplegia as well as a chronic urostomy bag who is cared for  at home by wound care for a stage III decubitus ulcer.  The wound care  nurse noted that the patient today was having fever, chills, and  temperature of 102.6.  She was sent over to the emergency room for  further evaluation.  In the emergency room she was found to have a white  count of 17 and urinalysis showing large leukocyte esterase and nitrite  positive.   The patient was started on IV fluids and given a gram of Rocephin.  She  states that she feels a little bit better.  In addition in recent  medical history, the patient states that she started developing a rash  on her extremities, her forearms and anterior leg that initially she  came to the emergency room for.  They told her it was a fungal  infection, put her on some Lamictal and sent her home.  However in  discussion with the ER attending who was on call today and was also on  call during that previous encounter and knows the patient well, he notes  the patient appears to be more indirectly self-induced or the patient is  vigorously scratching her extremities and that these wounds are not  necessarily acute, have occurred at repeated times, and are localized to  areas only that are accessible to her hands.  The patient otherwise is  doing well.  She complains of some chills and mild  fever.  She denies  any headaches or vision changes, no dysphagia, no chest pain,  palpitations, shortness of breath, wheeze, cough, and no abdominal pain.  No hematuria and no problems with her urostomy output.  No constipation  or diarrhea.  No focal extremity numbness, weakness, or pain other than  the large sense of fatigue.   REVIEW OF SYSTEMS:  Otherwise negative.   PAST MEDICAL HISTORY:  History of transverse myelitis with secondary  paraplegia, urinary output with status post urostomy tube by urology.  She also apparently has a social situation which has impacted her house  where reportedly she lives in a 2-3 bedroom home with her family, but  they do not have a wheelchair accessible bathtub and the patient  therefore is only able to receive a sponge bath twice a day.  She also  has a stage 3 decubitus ulcer on her buttocks leading to a wound Vac.   MEDICATIONS:  1. Neurontin 600 mg p.o. q.6 hours.  2.  Diazepam 5 mg p.o. twice a day plus 10 mg at night.  3. Baclofen 20 mg four times a day.  4. Vicodin two tablets p.o. q.8 hours p.r.n.  5. Zoloft 100 mg p.o. daily.  6. Multivitamin daily.  7. Nasabid daily.  8. Bactrim 100 mg p.o. daily.   ALLERGIES:  IODINE.   SOCIAL HISTORY:  No tobacco, alcohol, or drug use.  Social situation is  described at home.   FAMILY HISTORY:  Noncontributory.   PHYSICAL EXAMINATION:  VITAL SIGNS:  Temperature 96, heart rate 131,  blood pressure 99/67, now blood pressure is up to 122/81, she has TMAX  of 100.5, heart rate is still elevated, respirations 22, O2 saturations  96% on room air.  GENERAL:  She is alert and oriented x3.  She appears to have some mild  mental impairment which is not listed.  Question if this is a  personality disorder versus some possibly very minimal mild mental  retardation.  HEENT:  Normocephalic and atraumatic.  Mucous membranes are slightly  dry.  She has no carotid bruits.  HEART:  Regular rhythm, mild  tachycardia.  LUNGS:  Clear to auscultation bilaterally.  ABDOMEN:  Soft, nontender.  Mild distention.  Positive bowel sounds.  Her urostomy bag is functioning putting out urine which is moderately  clear.  EXTREMITIES:  No clubbing or cyanosis.  Trace pitting edema.  Both her  forearms and her right lower extremity have anterior surface scabbed  over lesions varying in nature and size, irregular in shape, but scabbed  over.  She has one that is previously drained on the back of her right  calf.   LABORATORY DATA:  Chest x-ray shows no acute evidence of cardiopulmonary  disease.  White count 17.1, H&H 12.7 and 39, MCV 79, platelet count 365,  94% shift.  UA shows large hemoglobin, greater than 80 ketones, nitrite  positive, large leukocyte esterase.  Blood cultures drawn and pending.  LFT's unremarkable.  Her micro UA notes 11-20 white cells, 3-6 red  cells, few bacteria though.  Sodium 133, potassium 3.9, bicarb 24,  chloride 103, BUN 18, creatinine 1.1, glucose 100.   ASSESSMENT:  1. Early signs of systemic infection possibly related to her urine.      We will put her on IV Rocephin for resistant cultures noting the      fact that she is on daily Bactrim and she does have open ulcers      including her stage 3 decubitus.  We will also cover her at this      time with IV Doxycycline 100 mg q.12 hours to cover for gram      positive Staph, possibly community acquired methicillin-resistant      Staphylococcus aureus.  2. Urinary tract infection.  See above.  3. Wounds and open sore.  We will also get a wound care consult for      the above.  4. Transverse myelitis.  Continue Baclofen, diazepam, and Neurontin.      Hollice Espy, M.D.  Electronically Signed     SKK/MEDQ  D:  01/01/2007  T:  01/01/2007  Job:  [id number]   cc:   Bryan Lemma. Manus Gunning, M.D.  Sigmund I. Patsi Sears, M.D.

## 2010-08-15 NOTE — Discharge Summary (Signed)
[name], [name]:  [id number]   MEDICAL RECORD NO.:  [id number]         PATIENT TYPE:  CINP   LOCATION:                               FACILITY:  [facility]   PHYSICIAN:  Ramiro Harvest, MD    DATE OF BIRTH:  08/07/1980   DATE OF ADMISSION:  01/20/2008  DATE OF DISCHARGE:  01/24/2008                               DISCHARGE SUMMARY   PRIMARY CARE PHYSICIAN:  Molly Maduro R. Manus Gunning, MD, [facility].   DISCHARGE DIAGNOSES:  1. Large gastric ulcer.  2. Urinary tract infection.  3. Constipation.  4. Depression.  5. History of recurrent urinary tract infections.  6. Viral gastroenteritis.  7. Gastroesophageal reflux disease.  8. History of decubitus ulcer.  9. Multiple sclerosis.  10.Transverse myelitis leading to lower extremity paraplegia.  11.History of recurrent bladder stones requiring urinary diversion.  12.History of situational depression.  13.Neurogenic bladder secondary to transverse myelitis, status post      ileal conduit diversion.  14.History of right ureteral stone of the uro-ileal.  15.Right hydronephrosis, status post spontaneous stone passage.  16.Right lower quadrant cystic mass in October 2008.  17.Seasonal allergies.  18.Status post tonsillectomy.  19.Status post wisdom tooth removal.  20.Prior history of suprapubic catheter placement in 2005.  21.Status post open cystolithotomy and exploration.  22.Exploratory laparotomy and ileal loop formation in February 2008      per Dr. Patsi Sears.   DISCHARGE MEDICATIONS:  1. Ceftin 250 mg p.o. b.i.d. x6 days.  2. Protonix 40 mg p.o. daily.  3. Neurontin 600 mg p.o. q.i.d.  4. Baclofen 20 mg p.o. q.i.d.  5. Zoloft 100 mg p.o. daily.  6. Multivitamin daily.  7. Aquacel to sacral region daily.  8. TobraDex ophthalmic ointment a 1/4 inch the left eye b.i.d.  9. Nasacort as needed.  10.Darvocet N 100 p.o. q.8 h. p.r.n.  11.Phenergan 25 mg p.o. q.6 h. p.r.n.  12.Protonix 40 mg p.o. daily.  13.Colace 100 mg p.o. b.i.d.  14.Valium 5 mg p.o. b.i.d. and 10 mg q.h.s.   DISPOSITION AND FOLLOWUP:  The patient will be discharged home.  The  patient is to follow up with Dr. Randa Evens in 1 month.  On followup, the  patient will likely be set up for a redo upper endoscopy.  The patient  is also to follow up with PCP in 2 weeks.  On followup, a repeat  urinalysis will need to be obtained to monitor for resolution of urinary  tract infection.  Consultations done.  1. A Gastroenterology consult was done.  The patient was seen in      consultation by Dr. Randa Evens of [facility] on January 20, 2008.  2. [facility] consult was obtained.  The patient was      seen by Dr. Claud Kelp on January 21, 2008.  3. The biopsy results from endoscopy will need to be followed up.  4. The biopsy results will need to be followed up.   PROCEDURES PERFORMED:  Acute abdominal series was performed on January 21, 2008, that showed a large amount of fecal  matter throughout the  colon.  Gas pattern is otherwise unremarkable.  Chest x-ray was obtained  on January 21, 2008, that showed no acute abnormalities.  An EGD with  biopsy was done on January 21, 2008 that showed a large gastric ulcer in  the woman who has been on chronic ranitidine therapy this maybe the  source of her nausea.   BRIEF ADMISSION HISTORY AND PHYSICAL:  [name] is a 30-year-  old white female with past medical history of transverse myelitis  leading to secondary paraplegia, neurogenic bladder, status post ileal  conduit as well as anxiety and depression, who had abdominal pain for  the past month.  She had been evaluated with a recent MRI and CT with  ultrasound, which showed gallstones, but no evidence of any gallbladder  distention or obstructive liver disease.  The patient was evaluated on  multiple fronts to ensure that her abdominal pain was not from other  sources.  She was evaluated by  Urology, who found no evidence of any  urethral obstruction as well as Gynecology found no evidence of any cyst  which the patient has had a previous history of.  Dr. Randa Evens from [facility] felt strongly that likely this was from her gallbladder  and he was arranged to make the patient a direct admission so that she  could be further evaluated.  The patient is currently doing okay.  The  patient denied any headaches, no visual changes, no dysphagia.  The  patient complained of some mild abdominal pain, but nothing severe.  No  hematuria.  No dysuria.  No diarrhea.  The patient with problems of  chronic constipation.  The patient has had a history of paraplegia  secondary to transverse myelitis.   Physical exam per admitting physician.  HEENT:  Normocephalic, atraumatic.  Mucous membranes slightly dry.  No  carotid bruits.  NECK:  Supple.  No lymphadenopathy.  RESPIRATORY:  Lungs are clear to auscultation bilaterally, but limited  secondary to body habitus.  CARDIOVASCULAR: Regular rate and rhythm, S1 and S2, 2/6 systolic  ejection murmur.  ABDOMEN:  Soft, obese, minimal tenderness, although more generalized and  nonfocal.  EXTREMITIES:  No clubbing or cyanosis.  Trace pitting edema.   Lab work was pending at the time of discharge.   HOSPITAL COURSE:  1. Large gastric ulcer.  The patient had presented with a 46-month      history of abdominal pain.  The patient was consulted on by      Gastroenterology by Dr. Randa Evens on January 20, 2008.  It was felt      that the patient's abdominal pain may be likely secondary to her      gallstones.  The patient was admitted, placed on IV fluids.  A CBC      was obtained, which was within normal limits.  A comprehensive      metabolic profile was also obtained, within normal limits.  A      urinalysis was obtained, which was positive for nitrates and small      leukocytes.  The patient was monitored.  The patient was also seen       in consultation by [facility] by Dr. Claud Kelp      on January 21, 2008.  It was felt that we needed to hold off on      getting a HIDA scan or any scan until the patient had an EGD done  and further workup was done by Gastroenterology.  The patient did      have her EGD done on January 21, 2008, which showed a large gastric      ulcer.  It was felt per Dr. Ramon Dredge that the patient will benefit      from Carafate and PPIs b.i.d.  During the hospitalization as such      the patient's Protonix was increased to 40 mg p.o. b.i.d. and she      was also placed on Carafate.  The patient improved symptomatically      throughout the hospitalization, and by day of discharge, the      patient was stable and improved condition.  The patient will follow      up as an outpatient with Dr. Randa Evens in approximately a month at      which time the patient will probably likely receive a repeat EGD.      Biopsies were taken off the gastric ulcers.  H. Pylori, the CLO-      test was sent, which came back negative.  Pathology was pending at      the time of discharge and this will need to be followed up per PCP      or per Dr. Randa Evens of [facility].  The patient improved      on a daily basis throughout the hospitalization and the patient      will be discharged in stable and improved condition.  2. Urinary tract infection.  It was noted per admission that the      patient did have an urinary tract infection as such the patient was      placed on IV Rocephin.  Urine cultures were obtained.  The patient      remained afebrile throughout the hospitalization.  The patient's IV      Rocephin was monitored and the patient was monitored on her IV      Rocephin.  The patient improved symptomatically and will be changed      to Ceftin 250 mg p.o. b.i.d. to complete a 10-day course of      antibiotics on discharge.  3. Constipation.  The patient was a little constipated on admission,       she was given some tap-water enemas with positive results.  The      patient did have a multiple bowel movements, and by day of      discharge, the patient's constipation had resolved, and she was in      stable and improved condition.  The rest of the patient's chronic      medical issues remained stable throughout the hospitalization, and      the patient will be discharged in stable and improved condition.      Vital signs on day of discharge, temperature 97.5, pulses 75, blood      pressure 103/69, respiratory rate 18, sating 99% on room air.      Discharge labs, sodium 141, potassium 4.3, chloride 105, bicarb 28,      BUN 4, creatinine 0.34, glucose of 84, calcium of 8.5.  It was a      pleasure to taking care of [name].      Ramiro Harvest, MD     DT/MEDQ  D:  01/24/2008  T:  01/25/2008  Job:  [id number]   cc:   Bryan Lemma. Manus Gunning, M.D.  James L. Malon Kindle., M.D.  Angelia Mould. Derrell Lolling, M.D.  Sigmund I. Patsi Sears, M.D.

## 2010-08-15 NOTE — Consult Note (Signed)
[name], [name] NO.:  [id number]   MEDICAL RECORD NO.:  [id number]          PATIENT TYPE:  OIB   LOCATION:                               FACILITY:  [facility]   PHYSICIAN:  [name], M.D.    DATE OF BIRTH:  10-31-1980   DATE OF CONSULTATION:  04/14/2008  DATE OF DISCHARGE:                                 CONSULTATION   REASON FOR CONSULTATION:  We are asked to see [name] today for  possible retained common bile duct stones status post laparoscopic  cholecystectomy by Dr. Claud [name].   HISTORY OF PRESENT ILLNESS:  This is a 30 year old female with a  significant surgical past medical history.  She had significant  abdominal pain with nausea in October 2009.  She was found to have  cholelithiasis.  She had an upper endoscopy done and was also found to  have a large ulcer.  In December, she was re-endoscoped, her ulcer had  healed.  She was still having some abdominal pain and nausea but  significantly less than she was in October.  She preferred to move  forward with laparoscopic cholecystectomy, and this was done by Dr.  Claud [name] yesterday, April 13, 2008.  During surgery, the IOC  appeared clear as though there were no stones.  However, during  subsequent review by the radiologist, there were possible filling  defects noted.  The patient tells me today that she is feeling well  considering her surgery yesterday.  She says her pain is decreased.  She  is tolerating liquids well.  She is not having any bowel movements but  is passing flatus.   PAST MEDICAL HISTORY:  1. Multiple sclerosis.  2. 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  3. Neurogenic bladder and ileal conduit diversion by Dr. Patsi [name].  4. History of stage III sacral decubitus with MRSA.  5. Central abdominal obesity.  6. Situational depression.  7. Cyclic ovarian cyst.  8. Recurrent UTIs with bladder stones and chronic constipation.   SURGICAL  PROCEDURES:  1. Tonsillectomy.  2. Wisdom teeth removal.  3. Suprapubic catheter in 2005.  4. Open cystolithotomy with exploratory laparotomy and ileal loop      placement in February 2008.  5. Yesterday, she had a laparoscopic cholecystectomy with extensive      lysis of adhesions.   CURRENT MEDICATIONS:  Zomig, Nasacort, sertraline, promethazine,  gabapentin, baclofen, diazepam, pantoprazole, Cytra-2, cranberry  capsules, multivitamin and Artificial Tears.   ALLERGIES:  LATEX, IODINE AND ASPIRIN.   REVIEW OF SYSTEMS:  As per HPI.   SOCIAL HISTORY:   DICTATION ENDED HERE      Veronica Police, PA    ______________________________  [name], M.D.    MLY/MEDQ  D:  04/14/2008  T:  04/14/2008  Job:  [id number]

## 2010-08-18 NOTE — H&P (Signed)
NAMEADELLA, MANOLIS NO.:  [id number]   MEDICAL RECORD NO.:  [id number]          PATIENT TYPE:  INP   LOCATION:  2905                         FACILITY:  [facility]   PHYSICIAN:  Sherin Quarry, MD      DATE OF BIRTH:  26-Nov-1980   DATE OF ADMISSION:  04/13/2005  DATE OF DISCHARGE:                                HISTORY & PHYSICAL   [name] is a 30 year old lady who initially presented, in February  2005, with acute onset of paraplegia and was ultimately found to have  evidence of transverse myelitis.  Since this diagnosis, she has been living  with her father who assists with her care.  She is confined to a wheelchair  and is incontinent of urine requiring the use of Depends.  In the past  efforts have been made to place a suprapubic catheter but this has not been  helpful as the catheter would frequently clog up.  According to her father,  she was in her usual state of health until this morning when she ate her  breakfast and then went to sleep.  The father reports that when they went to  try to wake her up, they could not do so.  When she was stimulated, she  would open her eyes, smile but would not respond to commands and would not  spontaneously move her arms or legs.  For this reason the patient was  brought to the [facility] emergency room for evaluation.   On arrival, her temperature was noted to be 101 degrees rectal, blood  pressure was 126/80, heart rate was 139, respirations were 16.  O2  saturation was 100%.  Initial laboratory studies obtained included a  urinalysis which showed profound pyuria and bacteruria.  The sodium was 136,  potassium 4.2, CO2 was 24, creatinine was 1.3, BUN was 26.  White count was  20,700.   While in the emergency room, the patient has been observed to continue to be  essentially unresponsive.  With verbal or physical stimuli, she will open  her eyes, smile, appear to pay attention and then go back to sleep.  Her  father  states that she has not received any new medications.  She has not  complained of headache, chest pain.  There has been no vomiting, abdominal  pain, or diarrhea.  She has not complained of dysuria.   PAST MEDICAL HISTORY:   MEDICATIONS:  1.  Ditropan 15 mg daily.  2.  Trimethoprim 100 mg daily.  3.  Zyrtec once daily.  4.  Sertraline 100 mg daily.  5.  Neurontin 600 mg every six hours.  6.  Baclofen 20 mg q.i.d.  As previously mentioned, none of these medications are new.   There are no known drug allergies.   OPERATIONS:  1.  In the past she has had a suprapubic catheter.  2.  She has also had a tonsillectomy.  3.  Wisdom tooth removal.   She apparently has had no other medical illnesses with the exception of the  above mentioned transverse myelitis.   FAMILY HISTORY:  Notable  for Parkinson disease in the patient's father.  There is a significant family history of hypertension, heart disease,  hyperlipidemia, and diabetes.   SOCIAL HISTORY:  The patient lives with her parents.  She does not abuse  alcohol or drugs.   REVIEW OF SYSTEMS:  Could really not be obtained, all the information I can  obtain from her father is described above.   PHYSICAL EXAMINATION:  HEENT:  The pupils are large and are equal and  reactive to light.  NECK:  Supple.  CHEST:  Clear.  CARDIOVASCULAR:  Reveals increased heart rate with normal S1 S2 without  rubs, murmurs, or gallops.  ABDOMEN:  Benign.  There are normal bowel sounds.  There are no masses or  tenderness.  No guarding or rebound.  GENITOURINARY:  A Foley has been placed and is draining a very purulent-  appearing urine.  NEUROLOGIC:  Testing, the patient will respond both to verbal and physical  stimuli by opening her eyes and turning her head from left to right.  She  does not speak.  She will not spontaneously move her arms or her legs.  EXTREMITIES:  Reveal no evidence of cyanosis or edema.   IMPRESSION:  1.  Acute febrile  illness, suspect urinary tract infection with sepsis.  2.  Profound obtundation, possibly secondary to number one.  3.  Transverse myelitis.  4.  Multiple muscle relaxant medications as described above.   PLAN:  1.  We will obtain blood and urine cultures and then start the patient      empirically on Cipro 400 mg IV every 12 hours.  Because of the apparent      seriousness of the patient's illness, I am going to add vancomycin to      this regimen, per pharmacy protocol.  2.  A chest x-ray has been obtained and is negative.  3.  We will obtain a CT scan of the brain to screen for intracranial      hemorrhage.  4.  I think this patient needs to be very closely monitored in a unit      setting in regard to her profound neurologic problems.           ______________________________  Sherin Quarry, MD     SY/MEDQ  D:  04/13/2005  T:  04/14/2005  Job:  [id number]   cc:   Inger, Dr.  [facility] I. Patsi Sears, M.D.  Fax: [phone]   Marolyn Hammock. Thad Ranger, M.D.  Fax: [phone]

## 2010-08-18 NOTE — Cardiovascular Report (Signed)
NAME:  [name], [name]                        ACCOUNT NO.:  [id number]   MEDICAL RECORD NO.:  [id number]                   PATIENT TYPE:  INP   LOCATION:  3034                                 FACILITY:  [facility]   PHYSICIAN:  Melvyn Novas, M.D.               DATE OF BIRTH:  02/12/1981   DATE OF PROCEDURE:  05/21/2003  DATE OF DISCHARGE:                              CARDIAC CATHETERIZATION   This 30 year old right-handed Caucasian female presents with pictures of a  transverse myelitis sensory loss of T2 on the right, C6-7 on the left, and  has undergone an LP today.  This LP procedure was done after the patient was  informed about the risks and benefits of the procedure and that we need to  obtain spinal fluid to rule out a demyelinating process or viral infectious  ADEM.  The patient was able to sit up in bed with some assistance and lean  forward.  She tolerated the injection of 1% lidocaine and the sterile  preparation of the access area.  L3, L4 interspace was accessed with a  regular spinal needle and a clean cervical spinal fluid at 3 mL each in four  vials was obtained.  The patient tolerated the procedure well.   The fluid is sent for cytology, glucose, protein and cell count; for  oligoclonal bands, Lime antibody or antigen testing, for a gram stain and  viral culture, PCR for viruses is also sent.  Testing for possible  manifestations of herpes myelitis.  The patient was provided with a  prescription for p.r.n. headache medicine, Tylenol.                                               Melvyn Novas, M.D.    CD/MEDQ  D:  05/21/2003  T:  05/22/2003  Job:  [id number]

## 2010-08-18 NOTE — Op Note (Signed)
NAMEMELVINE, [name] NO.:  [id number]   MEDICAL RECORD NO.:  [id number]          PATIENT TYPE:  INP   LOCATION:  6742                         FACILITY:  [facility]   PHYSICIAN:  Marlan Palau, M.D.  DATE OF BIRTH:  05/06/1980   DATE OF PROCEDURE:  04/13/2005  DATE OF DISCHARGE:                                 OPERATIVE REPORT   This is a lumbar puncture note done on the April 13, 2005.   HISTORY:  This is a 30 year old patient with transverse myelitis who  presents with fever, elevated white count, altered mental status.  Patient  is being evaluated for possible meningitis.   Lumbar puncture was performed with the patient in the fetal position on the  left side.  Low back was cleaned with Betadine solution, approximately 2 mL  of 1% Xylocaine was used for local anesthetic.  A 20 gauge spinal needle was  inserted in the L3-4 interspace and approximately 18 mL of clear colorless  spinal fluid was removed for testing.  Opening pressure was 220 mmH2O.  Tube  #1 was sent for VDRL, cryptococcal antigen.  Tube #2 was saved.  Tube #3 was  sent for cell, differential, glucose, protein.  Tube #4 was sent for routine  culture.  No complications of above procedure were noted.  The patient  tolerated procedure well.      Marlan Palau, M.D.  Electronically Signed     CKW/MEDQ  D:  04/13/2005  T:  04/15/2005  Job:  [id number]   cc:   [facility]  [address]  [place], [place] [address]

## 2010-08-18 NOTE — Discharge Summary (Signed)
NAME:  [name], [name]                        ACCOUNT NO.:  [id number]   MEDICAL RECORD NO.:  [id number]                   PATIENT TYPE:  IPS   LOCATION:  4008                                 FACILITY:  [facility]   PHYSICIAN:  Ellwood Dense, M.D.                DATE OF BIRTH:  1980-12-31   DATE OF ADMISSION:  05/25/2003  DATE OF DISCHARGE:  07/15/2003                                 DISCHARGE SUMMARY   DISCHARGE DIAGNOSES:  1. Transverse myelitis with incomplete quadriparesis.  2. Neurogenic bladder.  3. Situational depression.  4. Spasticity.  5. Multiple urinary tract infections.   HISTORY OF PRESENT ILLNESS:  [name] is a 30 year old female in relatively  good health except for recent history of severe low back pain on February  15.  She was treated in the emergency department, noted to have problems  ambulating at home, reported to neuro for workup.  MRI done of cervical  spine showed acute transverse myelitis questionably due to MS, C4, C7, and  hyperintensity thoracic cord area.  She was treated with IV steroids with  minimal improvement in right upper extremity strength. [name] has been  following patient, doubt symptoms due to MS.  LP done was negative for Lyme disease and glucose and protein normal.  PT/OT  initiated and patient at maximum assistance for bed mobility, able to sit at  edge of bed with maximum assistance with tendency to lose balance, left  greater than right.  She is maximum to total assistance for lower body care,  moderate assistance for upper body care.   PAST MEDICAL HISTORY:  1. Sinusitis in December 2004.  2. Tonsillectomy.  3. Occasional headaches.  4. Mild anxiety.   ALLERGIES:  no known drug allergies.   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  Does have family in town.  She does not use  any tobacco or alcohol.   HOSPITAL COURSE:  [name] was admitted to rehab on  May 25, 2003, for  inpatient therapy to consist of PT and OT daily.  Past admission, the  patient was placed on IV Solu-Medrol and was placed on slow wean of p.o.  steroids.  Initially Foley was continued for neurogenic bladder.  Subcutaneous Lovenox was continued for DVT prophylaxis.  Foley was  discontinued February 24, and bowel and bladder program was initiated.  The  patient was noted to be incontinent of bowel and bladder.  However, with  scheduled toileting, bowel program has been successful in preventing  incontinence.  Labs were checked at admission showing hemoglobin 20.3,  hematocrit 40.8, white count 8.4, platelets 243.  Sodium 141, potassium 3.9,  chloride 102, CO2 30, BUN 16, creatinine 0.7, glucose 134, total protein  4.8, albumin 2.8.  UA C&S done on admission grew greater than 100,000  colonies of E. coli, and patient was  treated with Cipro x 5 days.  As Foley  was discontinued, the patient was noted to have problems voiding.  Trial of  Flomax and Urecholine initiated.  She did succeed in emptying her bladder,  however, continued incontinence due to lack of sensation, lack of pressure.  She was taken off Flomax and Urecholine.  She is able to empty her bladder  incontinently. A toileting schedule has been ineffective.  A followup UA was  done on March 26, and patient was noted to have greater than 100,000  colonies of Enterococcus.  She was treated with amoxicillin for this.  Due  to continued UTI and neurogenic bladder, she was started on Macrodantin 50  mg b.i.d. for UTI prevention.  As unable to have successful bladder program  due to neurogenic bladder, urology was consulted for input regarding  suprapubic catheter.  Procedure was discussed with patient, and currently  plans are for suprapubic catheter placement April 21 at 11 a.m. at [facility] on outpatient basis.   The patient has worked hard on her therapy.  Her status continues to be  adjustment  disorder, and [name], neuropsychiatry, has been following her  along for support.  Progress remains slow and in part is impaired severely  due to lower extremity spasm, decreased truncal control.  The patient is on  Baclofen and Zanaflex with continued spasticity.  She continues with  quadriparesis bilateral lower extremities greater than upper extremities.  She is at moderate assistance for all mobility as well as safety to perform  tasks.  She is unable to place sliding board transfer due to lack of upper  extremity strength.  Propulsion is limited due to lower extremity weakness.  She is currently at moderate assistance with leg loops for bed mobility,  minimum assistance for transfer to wheelchair, moderate assistance for  transfer to sofa.  She is able to sit for short times with supervision for  dynamic balance.  She continues to require passive range of motion to assist  spasticity.  She is currently at set up for upper body bathing, set up to  moderate assistance for upper body dressing, set up to maximum assistance  for lower body care.  Due to amount of care needed and minimal neurological  recovery, a search for nursing home was initiated.  Bed is available at  [facility], and patient to be discharged to this facility.  Progressive PT, OT to continue past discharge.  Also indwelling Foley was  placed on April 13 to help manage bladder incontinence secondary to  neurogenic bladder.  Bowel program is continuing to evening.  Routine  pressure release measures to continue to prevent breakdown.  PRAFO for  bilateral lower extremity to be used when in bed.   DISCHARGE MEDICATIONS:  1. Claritin D 1 p.o. daily.  2. Zoloft 100 mg daily.  3  Multivitamins 1 per day.  1. Protonix 40 mg a day.  2. Dulcolax suppositories 1 per rectum q.p.m.  3. Baclofen 10 mg p.o. four times a day.  4. Zanaflex 2 mg p.o. b.i.d.  5. Macrodantin 50 mg p.o. b.i.d. 6. Flexeril 5 mg p.o.  t.i.d. p.r.n. increased spasticity.  7. Ambien 5 to 10 mg p.o. q.h.s. p.r.n.   DIET:  Regular.   SPECIAL INSTRUCTIONS:  Routine pressure release measures, continue range of  motion lower extremities to help with spasticity, continue PRAFO bilateral  lower extremity when in bed.  Progressive PT, OT to continue past discharge,  routine bowel and bladder program, routine Foley care.  Follow up suprapubic  catheterization procedure April 24 at 11 a.m. at [facility].   FOLLOW UP:  The patient is to follow up with [name] in one to two  months for routine check.  Follow up with Dr. Patsi Sears for suprapubic  catheter placement.  Follow up with Dr. Thomasena Edis in a month.      Greg Cutter, P.A.                    Ellwood Dense, M.D.    PP/MEDQ  D:  07/14/2003  T:  07/14/2003  Job:  [id number]   cc:   Dr. Deborha Payment L. Thad [name], M.D.  [address]  [address]  [place]  [place] [address]  Fax: [phone]   Sigmund I. Patsi Sears, M.D.  [address]  [place]  [place] [address]  Fax: [phone]

## 2010-08-18 NOTE — Discharge Summary (Signed)
[name], [name]                        ACCOUNT NO.:  [id number]   MEDICAL RECORD NO.:  [id number]                   PATIENT TYPE:  INP   LOCATION:  3019                                 FACILITY:  [facility]   PHYSICIAN:  Sigmund I. Patsi Sears, M.D.         DATE OF BIRTH:  12-19-1980   DATE OF ADMISSION:  09/04/2003  DATE OF DISCHARGE:  09/07/2003                                 DISCHARGE SUMMARY   DISCHARGE DIAGNOSES:  1. Transverse myelitis at the C4-C5 level and hypersensitivities in the     thoracic cord.  2. Suprapubic tube for a hypotonic bladder.   HISTORY OF PRESENT ILLNESS:  This is a 30 year old female who was diagnosed  with transverse myelitis in February 2005 after a viral illness left her  with progressive weakness.  We were consulted to see this patient for  hypotonic bladder.  At that time, a suprapubic tube was placed and the  patient has been at home under the care of her mother.  Home health has been  changing her suprapubic tube once a month; however, at some point her  suprapubic tube placement has not been in the correct place.  She has  presented to the office with clots in her urinary bag and not a lot of  output out of her suprapubic tube.  Dr. Patsi Sears evaluated and is fearful  that she did not have her suprapubic tube in her bladder.   HOSPITAL COURSE:  Patient was admitted to [facility] on Saturday,  September 04, 2003.  She was then evaluated Sunday morning with CT scan which  showed that her suprapubic tube was not in her bladder.  She was taken to  the operating room by Dr. Patsi Sears for removal of her old suprapubic tube  for clot evacuation and replacement of a new suprapubic tube.  She tolerated  the procedure well and has done well postoperatively.  __________ JP drain  was also placed at the site of the wound to help clear out any leftover  clot.  She has had slow drainage from her suprapubic tube and we will leave  it in until later this  week.  Patient is to be discharged this morning home  with her suprapubic tube and home health services will be resumed.   DISCHARGE MEDICATIONS:  1. Vicodin.  2. Trimethoprim one p.o. q.d.  3. She will resume all home medication.   CONDITION ON DISCHARGE:  Stable.   PLAN:  [name] will follow up in the office on Thursday or Friday with  Terri Piedra, nurse practitioner, to have her JP drain removed.  She will  then return about 10 days postop to have her staples removed.  Home health  will continue to change out her suprapubic tube monthly.      [place], [place].P.  Sigmund I. Patsi Sears, M.D.    Suella Grove  D:  09/07/2003  T:  09/08/2003  Job:  [id number]

## 2010-08-18 NOTE — Consult Note (Signed)
NAME:  [name], [name]                        ACCOUNT NO.:  [id number]   MEDICAL RECORD NO.:  [id number]                   PATIENT TYPE:  IPS   LOCATION:  4008                                 FACILITY:  [facility]   PHYSICIAN:  Sigmund I. Patsi Sears, M.D.         DATE OF BIRTH:  Nov 15, 1980   DATE OF CONSULTATION:  DATE OF DISCHARGE:                                   CONSULTATION   HISTORY OF PRESENT ILLNESS:  I was asked to see this very pleasant 30-year-  old for evaluation for suprapubic tube due to a neurogenic bladder.  She has  been a relatively healthy young lady who has been went to the ER in February  2005 with complaints of low back pain and had problems ambulating and was  referred for a neuro workup. The MRI shows acute transverse myelitis.  Treatment with steroids with minimal improvement in upper extremity  strength.  Dr. Thad Ranger is following for neurology.  She has been on  Urecholine and been I&O cathed p.r.n.  She has maintained elevated PVRs  despite her Urecholine therapy.  Has had a hard time finding a good balance  with the Urecholine.  Has had frequency on the Urecholine but continues to  have elevated PVRs.  She desires to have a suprapubic tube placed to help  with her problem.   PAST MEDICAL HISTORY:  1. Mild anxiety.  2. Headaches occasionally.  3. Sinusitis and __________ .  4. Tonsillectomy.   MEDICATIONS:  1. Loratadine 10 mg p.o. daily.  2. Sudafed 120 mg p.o. b.i.d.  3. Zoloft 100 mg p.o. daily.  4. Multivitamin.  5. Protonix 40 mg daily.  6. Dulcolax suppository.  7. Baclofen 10 mg p.o. q.i.d.  8. Zanaflex 2 mg p.o. b.i.d.  9. Macrobid 50 mg p.o. b.i.d.   ALLERGIES:  No known drug allergies.   SOCIAL HISTORY:  She is a Consulting civil engineer at [employer].  She lived in the dorm prior to  this admission.  Her family lives here in [place] and is very supportive.   REVIEW OF SYSTEMS:  Significant for urinary retention.   PHYSICAL EXAMINATION:  VITAL SIGNS:   Afebrile.  Vital signs stable.  GENERAL:  Well-developed, well-nourished female in no acute distress.  CARDIAC:  Regular rate and rhythm.  LUNGS:  Clear to auscultation bilaterally.  ABDOMEN:  Soft, nontender.  Positive bowel sounds.  NEUROLOGIC:  The patient in wheelchair working on studies.   ASSESSMENT:  Neurogenic bladder secondary to myelitis.   PLAN:  Will discuss with Dr. __________ .  The patient was weaned off the  Urecholine and appears that the maximum dose was about 100 mg daily.  We  will look at OR time also for placement of suprapubic tube and will see if  Dr. Patsi Sears has any other suggestions.     [place], [place].P.  Sigmund I. Patsi Sears, M.D.    HB/MEDQ  D:  07/12/2003  T:  07/13/2003  Job:  [id number]

## 2010-08-18 NOTE — Discharge Summary (Signed)
[name], [name] NO.:  [id number]   MEDICAL RECORD NO.:  [id number]          PATIENT TYPE:  INP   LOCATION:  1435                         FACILITY:  [facility]   PHYSICIAN:  Sigmund I. Patsi Sears, M.D.DATE OF BIRTH:  08/12/1980   DATE OF ADMISSION:  05/18/2006  DATE OF DISCHARGE:  05/29/2006                               DISCHARGE SUMMARY   ADMISSION DIAGNOSES:  Quadriparesis with large bladder stones, complete  urinary incontinence with chronic Foley catheterization.   DISCHARGE DIAGNOSES:  Quadriparesis with large bladder stones, complete  urinary incontinence with chronic Foley catheterization.   PROCEDURES:  Open cystolithotomy and ileoloop diversion done on May 20, 2006.   HISTORY OF PRESENT ILLNESS/HOSPITAL COURSE:  Ms. Aaberg is a 30 year old  female who is wheelchair bound due to transverse myelitis.  The patient  has quadriparesis with Foley and urinary incontinence and bladder  stones.  After extensive consulting and due to her condition, the  patient agreed to undergo open cystolithotomy and ileoloop diversion.  The patient was admitted to the hospital two days prior to surgery where  she received bowel prep.  The patient subsequently underwent the surgery  mentioned above.  Her postoperative course was relatively smooth and  uneventful.  The patient's bowel function slowly came back and her  appetite advanced slowly to regular.  Her pain was adequately controlled  by IV narcotics which were switched to p.o. narcotics a few days later.  The patient was put on a KinAir bed.  Her ostomy remained viable and was  functioning well.  Her wound remained intact and healed quite nicely.  Her hospital stay was relatively longer than expected, simply because of  her social status.  Initially, the patient wanted assisted home living  which, unfortunately, was declined.  The patient refused skilled nursing  facility, so by postop day #9, the patient was  deemed stable to go home.  The patient will go home and she will get Home Health nurse evaluation  and education and reinforcements for her ostomy care and for home safety  evaluation.  The patient will receive Home Health, physical therapy and  occupational therapy.  The patient was given a prescription for Milk of  Magnesia and Colace to keep her bowels regular, and she was given a  prescription for Vicodin for pain control.  The patient was advised to  go back on her home medications.   DISCHARGE PHYSICAL EXAMINATION:  VITAL SIGNS:  Afebrile, stable.  GENERAL:  No acute distress.  ABDOMEN:  Soft and nontender.  Wound has healed completely.  Staples  were discontinued and Steri-Strips were  applied.  Ostium was pink, protuberant, viable.  Two stents were present  and in place.  The patient was then discharged home.  She was advised to  follow up, Dr. Patsi Sears was thinking about 1-2 weeks for a wound check  and postoperative check.     ______________________________  Cornelious Bryant, MD      Sigmund I. Patsi Sears, M.D.  Electronically Signed    SK/MEDQ  D:  05/29/2006  T:  05/29/2006  Job:  [id number]

## 2010-08-18 NOTE — Discharge Summary (Signed)
NAMEDALEENA, ROTTER NO.:  [id number]   MEDICAL RECORD NO.:  [id number]          PATIENT TYPE:  INP   LOCATION:  0154                         FACILITY:  [facility]   PHYSICIAN:  Angelia Mould. Derrell Lolling, M.D.DATE OF BIRTH:  09-01-1980   DATE OF ADMISSION:  04/13/2008  DATE OF DISCHARGE:  04/16/2008                               DISCHARGE SUMMARY   FINAL DIAGNOSES:  1. Chronic cholecystitis with cholelithiasis.  2. Atelectasis, postoperative, resolved.  3. Multiple sclerosis.  4. 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  5. Past history of stage III sacral decubitus with methicillin      resistant Staphylococcus aureus.  6. History of gastric ulcer, healed.   OPERATIONS PERFORMED:  1. Laparoscopic cholecystectomy with intraoperative cholangiogram,      extensive laparoscopic lysis of adhesions, date of surgery April 13, 2008.   HISTORY:  This is a 30 year old white female with lower extremity  paraplegia from transverse myelitis.  She has a past history of bladder  stones and subsequent ileal loop urinary diversion.  She was  hospitalized in November of 2009 with nausea and abdominal pain.  She  was found to have gallstones, but upper endoscopy showed a large gastric  ulcer and the decision was made to forego cholecystectomy because of  uncertainty as to the cause of her symptoms.  Dr. Randa Evens treated her  gastric ulcer and endoscopically showed that the ulcer had healed.  The  patient continues to express desire have her gallbladder removed.  She  is fearful that it will cause some type of catastrophic problem in the  future.  She continues to have intermittent upper abdominal pain and  nausea, although it has improved.  She was evaluated by me both in the  hospital and as an outpatient and counseled regarding cholecystectomy.  Decision was made to go ahead with the surgery and she was brought to  hospital electively.   HOSPITAL COURSE:   On the day of admission, the patient was taken to the  operating room.  We were able to gain access into the left upper  quadrant laparoscopically and we were able to take down her adhesions  enough to do the cholecystectomy laparoscopically.  The surgery was  relatively uneventful.   On postoperative day #1, the radiologist stated they thought there might  be a retained small common duct stone which was not apparent at the time  of surgery.  The patient was not ready for discharge because her abdomen  was somewhat distended.  She was seen by Dr. Bernette Redbird.  MRCP was  performed which was negative for retained stone.  Her liver function  tests remained basically normal, although they did have a slight  transient elevation which resolved.  Both Dr. Matthias Hughs and I felt that  she did not have any evidence of retained common duct stone and possibly  this was an air bubble.   The remainder of her hospital course was relatively benign.  Because of  her paraplegia and relative inactivity, she did have a little bit of  ileus and had  some low-grade fever consistent with atelectasis, but with  her mobilization and pulmonary toilet she improved.   She is discharged on April 16, 2008.  At that time she was feeling  well, tolerating a diet, had become afebrile, was starting to have bowel  movements again.  Wounds looked good.  Ileal loop was working good.  A  CBC and complete metabolic panel were normal.  She was asked to return  see me in the office in 3 weeks.      Angelia Mould. Derrell Lolling, M.D.  Electronically Signed     HMI/MEDQ  D:  05/18/2008  T:  05/18/2008  Job:  [id number]   cc:   Bryan Lemma. Manus Gunning, M.D.  Fax: [phone]   Llana Aliment. Malon Kindle., M.D.  Fax: [phone]   Sigmund I. Patsi Sears, M.D.  Fax: [phone]

## 2010-08-18 NOTE — Op Note (Signed)
NAME:  [name], [name]                        ACCOUNT NO.:  [id number]   MEDICAL RECORD NO.:  [id number]                   PATIENT TYPE:  AMB   LOCATION:  DAY                                  FACILITY:  [facility]   PHYSICIAN:  Sigmund I. Patsi Sears, M.D.         DATE OF BIRTH:  [birth year]   DATE OF PROCEDURE:  07/22/2003  DATE OF DISCHARGE:                                 OPERATIVE REPORT   PREOPERATIVE DIAGNOSIS:  Neurogenic bladder.   POSTOPERATIVE DIAGNOSIS:  Neurogenic bladder.   OPERATION/PROCEDURE:  1. Cystoscopy.  2. Suprapubic catheter placement.   SURGEON:  Sigmund I. Patsi Sears, M.D.   ASSISTANT:  Thyra Breed, MD   ANESTHESIA:  LMA.   DRAINS:  18-French suprapubic catheter to straight drain.   COMPLICATIONS:  None.   INDICATIONS:  [name] is a 30 year old female who suffers from transverse  myelitis which has likely lead to a neurogenic bladder.  The patient has  total incontinence from her neurologic condition.  Therefore, discussion was  had with the patient regarding placement of a suprapubic catheter for  urinary drainage and relief of her incontinence.  The patient understands  the risks, benefits and alternatives of this procedure and is willing to  proceed.  Informed consent has been obtained.   DESCRIPTION OF PROCEDURE:  Following identification by her arm bracelet, the  patient was brought to the operating room and placed in the supine position.  She then underwent general anesthesia and was placed in the dorsal lithotomy  position.  She received preoperative IV antibiotics and her suprapubic  region was shaved, prepped with Betadine and her entire lower abdomen,  perineum and genitalia were prepped and draped in the usual sterile fashion.  Initially a 18-degree cystoscopic lens through a 22-French sheath was used  to inspect the patient's urethra and bladder.  The urethra appeared to be  wide open at rest with little coaptation upon entry with the  cystoscope.  The bladder itself appeared grossly normal in appearance.  Both the right  and the left ureteral orifice were in their normal anatomic location  effluxing clear urine bilaterally.  Further inspection of the bladder  revealed no evidence of foreign bodies, stones, bladder tumors, or the  mucosal irregularities.  At this time, the bladder was left full, and the  patient placed in Trendelenburg.  The Lowe's retractor was then inserted  into the patient's urethra and guided upward until it could be palpated in  the suprapubic region.  A 15-blade scalpel was then used to make a 1-2 cm  incision just overlying the palpable tip of the Lowe's retractor.  The  scalpel was then used to cut the deeper subcutaneous tissue and fascia while  remaining directly on top of the Lowe's retractor.  The Lowe's retractor was  then punctured through the open incision in the suprapubic region without  difficulty.  An 18-French Foley catheter was then placed within the jaws of  the Lowe's retractor.  The retractor as well as the Foley catheter were then  brought through the bladder and out through the patient's urethra.  Again,  the cystoscope was used to follow the tip of the Foley catheter into the  patient's urethra and into the bladder and 10 mL of sterile fluid placed in  the balloon under direct vision and the suprapubic catheter was seen to be  in good location within the bladder.  The cystoscope was then removed.  A 3-  0 nylon suture was then used to affix the suprapubic tube to the skin.  The  suprapubic site was then washed, dried and sterile dressing applied.  The  patient tolerated the procedure well.  There were no complications.   Please note that Dr. Jethro Bolus was present and participated in the  entire procedure as he was the responsible surgeon.   DISPOSITION:  After awaking from general anesthesia, the patient was  transported to the post anesthesia care unit in stable  condition.  From  here, she will be discharged and has been asked to call Dr. Imelda Pillow  office at [phone] for return appointment.     Thyra Breed, MD                            Sigmund I. Patsi Sears, M.D.    EG/MEDQ  D:  07/22/2003  T:  07/23/2003  Job:  [id number]

## 2010-08-18 NOTE — Op Note (Signed)
NAMECHIYO, [name]                        ACCOUNT NO.:  [id number]   MEDICAL RECORD NO.:  [id number]                   PATIENT TYPE:  INP   LOCATION:  3019                                 FACILITY:  [facility]   PHYSICIAN:  Sigmund I. Patsi Sears, M.D.         DATE OF BIRTH:  [birth year]   DATE OF PROCEDURE:  09/05/2003  DATE OF DISCHARGE:                                 OPERATIVE REPORT   PREOPERATIVE DIAGNOSES:  1. Suprapubic hematoma.  2. Suprapubic tube misplacement.   POSTOPERATIVE DIAGNOSES:  1. Suprapubic hematoma.  2. Suprapubic tube misplacement.   OPERATION:  Suprapubic exploration, evacuation of suprapubic hematoma, and  suprapubic tube placement.   SURGEON:  Sigmund I. Patsi Sears, M.D.   ASSISTANT:  Terri Piedra, N.P.   PREPARATION:  After appropriate preanesthesia, the patient is brought to the  operating room and placed on the operating table in the dorsal supine  position, where general LMA anesthesia was introduced.  She was then  replaced in the dorsal lithotomy position, where the pubis was prepped with  Betadine solution and draped in the usual fashion.   PROCEDURE:  Suprapubic exploration was accomplished, with an elliptical  incision around the site of previous suprapubic tube insertion.  The bladder  was filled with fluid, and fluid was leaking into the wound.  The suprapubic  tract was dissected, down to the fascia.  The fascia was then entered with  the electrosurgical unit and suprapubic hematoma was evacuated from the  space of Retzius.  The bladder was identified, and two separate 2-0 Vicryl  sutures were placed through-and-through the bladder wall.  The bladder was  opened, the suprapubic tube placed, and following that clear urine was  obtained and the bladder irrigated through-and-through.  The urethral Foley  was removed.  Twenty milliliters were placed in the Foley balloon (22 Foley  with 30 mL balloon) and the fascia closed with running and  interrupted 2-0  Vicryl suture horizontally.  The subcutaneous tissue was then closed with 3-  0 Vicryl suture, and the skin was closed with skin stapler.  A dressing was  applied.  A 10 flat fully-fluted Blake drain was placed in the space of  Retzius and brought out through a separate stab wound, and this was sutured  in place with a 3-0 Prolene suture.  Following this a dressing was applied,  the patient was awakened and taken to the recovery room in good condition.                                               Sigmund I. Patsi Sears, M.D.    SIT/MEDQ  D:  09/05/2003  T:  09/06/2003  Job:  [id number]

## 2010-08-18 NOTE — Consult Note (Signed)
NAME:  [name], [name]                        ACCOUNT NO.:  [id number]   MEDICAL RECORD NO.:  [id number]                   PATIENT TYPE:  INP   LOCATION:  3019                                 FACILITY:  [facility]   PHYSICIAN:  Melvyn Novas, M.D.               DATE OF BIRTH:  [birth year]   DATE OF CONSULTATION:  DATE OF DISCHARGE:                                   CONSULTATION   This is a 30 year old right-handed Caucasian female with a history of  transverse myelitis who was previously admitted to the neurology service in  February 2005, after a viral illness left her with progressive weakness,  spasticity and sensory loss.  The patient was admitted to the service of Dr.  Casimiro Needle L. Reynolds and I performed a lumbar puncture on the 30 year old  female on May 21, 2003.  In April, Dr. Patsi Sears was asked to see the  patient for problems after developing a neurogenic bladder dysfunction and  she evaluated for insertion of a suprapubic catheter.  Yesterday, she  presented after the suprapubic catheter did not allow urine flow and seemed  to have been obstructed.  Straight catheterization through the urethra,  however, relieved her of clear, nonbloody, uninfected urine so that it was  felt that the suprapubic catheter might have been dislodged.  Dr. Patsi Sears  was planning for today to reinsert the suprapubic catheter.   DIAGNOSES:  1. Transverse myelitis, incomplete quadriparesis.  2. Neurogenic bladder.  3. Reactive depression.  4. Generalized spasticity due to neurogenic bladder and multiple urinary     tract infections.   The patient has transverse myelitis at the C4-C5 level and hyperintensities  in the thoracic cord as well.  LP negative for Lyme disease.  Glucose and  protein were normal.   SOCIAL HISTORY:  The patient is a Consulting civil engineer at [employer], was working part-time  prior to her initial illness.  She is now wheelchair bound.   PHYSICAL EXAMINATION:  VITAL SIGNS:  The  patient had a temperature of 97.2,  pulse 93, respiratory rate 60 to 80, systolic blood pressure 120, diastolic  was 70.  O2 saturation normal on room air.  HEENT:  Mucous membranes well perfused.  EXTREMITIES:  No clubbing or cyanosis.  The patient's lower extremities  appear slightly puff but pulses are palpable.  She does have spasticity in  all four extremities, very brisk deep tendon reflexes.  Clonus on the right  more than left.  Upgoing toes bilaterally and is unable to walk or sit up  without assistance.  NEUROLOGIC:  Cranial nerves and mental status are fully intact.  Sensory is  disturbed, left more than right.  There is a C5-C6 distribution and lower  extremity sensory is, according to the patient, completely gone.  She does  have sometimes tingling, pin and needle dysesthesias and interprets those as  a healing process.  She has absent abdominal reflexes.  Her upper  extremity  reflexes are also brisk and spastic.  She did have cross adductor reflex  with solicitation from left and right patella.   CONCLUSION:  1. 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  2. Urinary dysfunction due to displacement of suprapubic catheter, surgical     reinsertion planned for later today.  The patient can be discharged from     the hospital anytime when her urologic problems are addressed and has no     new onset of neurologic problems at this time.                                               Melvyn Novas, M.D.    CD/MEDQ  D:  09/05/2003  T:  09/06/2003  Job:  [id number]   cc:   Casimiro Needle L. Thad Ranger, M.D.  [address]  [address]  [place]  [place] [address]  Fax: [phone]

## 2010-08-18 NOTE — Discharge Summary (Signed)
NAMEBETHANN, [name] NO.:  [id number]   MEDICAL RECORD NO.:  [id number]          PATIENT TYPE:  INP   LOCATION:  1432                         FACILITY:  [facility]   PHYSICIAN:  Sigmund I. Patsi Sears, M.D.DATE OF BIRTH:  12-01-1980   DATE OF ADMISSION:  02/19/2007  DATE OF DISCHARGE:  02/20/2007                               DISCHARGE SUMMARY   CHIEF COMPLAINT:  This is a 30 year old female admitted to the hospital  for right ureteral stone at ureteroileal junction with right  hydronephrosis.  Patient was to have surgery by Dr. Patsi Sears and Dr.  Fredia Sorrow on February 20, 2007 for right percutaneous nephrostomy and  right percutaneous nephrolithotomy at [facility].   MEDICATIONS:  See history and physical.   ALLERGIES:  See history and physical.   SOCIAL HISTORY/FAMILY HISTORY AND REVIEW OF SYSTEMS - Please see history  and physical dictated on February 19, 2007.   DISCHARGE SUMMARY:  The patient was discharged home.  Patient was  canceled due to patient being able to spontaneously pass the distal  right ureteroileal junction stone without difficulty.   DISPOSITION:  Return home with family.  She will follow-up p.r.n. in our  office.      Jetta Lout, NP      Sigmund I. Patsi Sears, M.D.  Electronically Signed    DW/MEDQ  D:  04/07/2007  T:  04/07/2007  Job:  [id number]

## 2010-08-18 NOTE — H&P (Signed)
[name], [name] NO.:  [id number]   MEDICAL RECORD NO.:  [id number]           PATIENT TYPE:   LOCATION:                                 FACILITY:   PHYSICIAN:  Sigmund I. Patsi Sears, M.D. DATE OF BIRTH:   DATE OF ADMISSION:  05/19/2006  DATE OF DISCHARGE:                              HISTORY & PHYSICAL   HISTORY:  [name] is a 30 year old female, who is wheelchair bound,  with a history of transverse myelitis for 3 years.  She has poor eye-  hand coordination.  She is able to use a computer somewhat and appears  mentally alert.  She lives with her mother and father in a small 3-room  house, but she does not have a wheelchair accessible bathroom.  Therefore, the patient takes 2 sponge baths per week over the last 3  years.   The patient has been recommended for nursing home placement by [employer], but this has caused a tremendous financial burden at home, as  the family lives on the patient's disability income and the disability  income of her father.   The patient has been found to have bilateral hydronephrosis, with  bladder CT finding of 2 large bladder stones measuring 2.8 x 1.3 cm, and  a second stone measuring 2.5 cm.  The patient has had chronic Foley  catheter because of poor urinary output and urinary retention.  She has  been given Cipro p.o. in the past for chronic infection but no cultures  have been obtained in the past.   The patient is now admitted prior to her surgery, for bowel preparation  as well as skin preparation.  She will need 2 showers per day prior to  her surgery, as well as bowel preparation.  She will need Social Service  evaluation as well as stoma nurse evaluation preoperatively.   Her past history is as noted above.   MEDICATIONS:  1. Zoloft.  2. Trimethoprim.  3. Ditropan XL.  4. Zyrtec.  5. Baclofen.  6. Neurontin.  7. Multivitamin.   ALLERGIES:  IODINE.   SOCIAL HISTORY:  The patient lives in a  difficult physical home  environment as noted above.  Alcohol and tobacco are none.   FAMILY HISTORY:  Kidney stones (father), also significant for diabetes,  depression, hypertension.   REVIEW OF SYSTEMS:  CONSTITUTIONAL:  Significant for chronic skin  dermatitis secondary to poor washing habits (see above).  The patient  has poor toilet habits.  She uses a bedside commode in her bedroom,  because that is the only room in the house that is big enough for her  wheelchair.   ADMISSION PHYSICAL EXAMINATION:  A quadriparetic female in no acute  distress.  Her temperature is 98.7.  Blood pressure 123/80.  Pulse is 113 and  respiratory rate is 20.  O2 sats 100%.  HEENT:  PERRL.  NECK:  Supple and nontender nodes.  CHEST:  Clear to P and A.  ABDOMEN:  Soft, mildly obese, plus bowel sounds without organomegaly or  masses.  GENITOURINARY:  Normal female external genitalia.  EXTREMITIES:  Severe muscle wasting.  She has flaccidity, with no muscle  tone.  Her upper extremities also have slightly more tone but are poorly  coordinated in movements.  SKIN:  Otherwise normal to palpation.  I do not see skin breakdown or  skin infection.   IMPRESSION:  Quadriparesis with large bladder stones in a patient with  total urinary incontinence with chronic Foley catheterization.   PLAN:  Ileal loop formation, with cystolithotomy tomorrow.  The patient  is admitted preoperatively for skin, hair, nail cleansing, in order to  avoid a postoperative wound infection.  In addition, the patient will  need stoma nurse evaluation for marking and a Social Service evaluation  to help with postoperative placement.  The patient notes that the  bathroom at home is being re-engineered to allow her to use the  bathroom, and I have recommended the possibility of nursing home  placement for physical therapy for approximately 1 month, until the  bathroom can be remodeled.  The patient and her mother have tremendous   anguish with regard to this plan, because the patient's mother has had  poor dealings with Social Services in the past (Social Services has  recommended to have the patient removed from the home because of poor  hygiene conditions).  The patient and her mother agree to have Social  Service reevaluation at this time and to agree to possible temporary  nursing home placement until her wounds heal and until the new bathroom  can be installed.   We will arrange surgical intervention tomorrow.      Sigmund I. Patsi Sears, M.D.  Electronically Signed     SIT/MEDQ  D:  05/19/2006  T:  05/19/2006  Job:  [id number]   cc:   Dr. Vilinda Blanks

## 2010-08-18 NOTE — H&P (Signed)
[name], [name]                        ACCOUNT NO.:  [id number]   MEDICAL RECORD NO.:  [id number]                   PATIENT TYPE:  INP   LOCATION:  3034                                 FACILITY:  [facility]   PHYSICIAN:  Casimiro Needle L. Thad Ranger, M.D.           DATE OF BIRTH:  1980-08-19   DATE OF ADMISSION:  05/20/2003  DATE OF DISCHARGE:                                HISTORY & PHYSICAL   CHIEF COMPLAINT:  Can't move legs.   HISTORY OF PRESENT ILLNESS:  This is the initial [facility] system  admission for this 30 year old right handed woman who has had a syndrome  with significant neurologic symptoms since the early morning hours of  Tuesday, February 15.  She reports that she awoke that morning, about 3:00  a.m., with a severe pain in her low back.  This pain then spread up and down  the back and was not relieved by changes in position.  She subsequently went  to the [facility] Emergency Room where she was diagnosed with a muscle  strain and was given a prescription for Vicodin and Flexeril.  The patient  and her mother report that even as they were leaving the [facility]  Emergency Room her legs were giving out and she was really unable to walk  at all.  Once arriving home, her mother was unable to get her out of the  car.  She subsequently went back to the [facility] Emergency Room where she  apparently encountered providers who suggested that her symptoms were non-  organic.  Yesterday she went to see her primary physician, Dr. Bryan Lemma.  E[place], who arranged for an urgent MRI of the brain, which is not available  for my review, but was interpreted as normal.  She was then referred today  for urgent neurologic consultation on an outpatient basis and I have  admitted her from the office for further considerations.  In addition to  weakness in the legs, the patient reports that her left arm doesn't work.  She is able to move the arm at the shoulder and the elbow, but  the hand is  basically useless.  She does note that when the symptoms started there was a  little bit of weakness in the right hand as well, but this seems to be  better.  She also notes that, as far as sensation goes, the left leg seems  more densely affected than the right.  She reports being unable to urinate  for the past two days and has not had a bowel movement over that period  either and her mother confirms that.  She says that she feels the urge to  urinate, but is just unable to do so.   REVIEW OF SYMPTOMS:  GENERAL:  __________, fatigue, pain in the back and  upper arms.  NEUROLOGIC:  Weakness and numbness in the arms and legs, as  outlined above.  PSYCHIATRIC:  Depression.  GASTROINTESTINAL:  Constipation.  RESPIRATORY:  Shortness of breath.  EARS, NOSE AND THROAT:  History of sinus  problems.  GENITOURINARY:  CV, heme, skin negative.   PAST MEDICAL HISTORY:  She does report occasional headaches and she is not  sure what these represent.  She does take medication for persistent sinus  problems.  She is on Zoloft for what she describes as a mild anxiety  disorder.   PAST SURGICAL HISTORY:  1. Tonsillectomy.  2. Wisdom tooth removal.   FAMILY HISTORY:  Remarkable for Parkinson's disease in her father, as well  as hypertension, heart disease, high cholesterol and diabetes.   SOCIAL HISTORY:  She is single.  She lives in a dorm at [facility] during the week  and with her parents on the weekends.  She says that she is happy in life.  She denies using tobacco, alcohol or illicit drugs.  She works as an  Land.   MEDICATIONS:  1. Zoloft 100 mg q. day.  2. Zyrtec D.  3. Ortho Tri-Cyclen.  4. Multivitamin.  5. Vicodin p.r.n.  6. Flexeril p.r.n.   ALLERGIES:  IODINE.   PHYSICAL EXAMINATION:  VITAL SIGNS:  Blood pressure 110/65 seated, in the  left arm; pulse 72; respirations 16.  GENERAL:  This is a healthy appearing woman seated in a wheelchair in no  evident  distress.  She seems relatively non-distressed by her present state.  HEAD:  Cranial is normocephalic and atraumatic.  Oropharynx is benign.  NECK:  Supple without carotid bruits.  HEART:  Regular rate and rhythm without murmurs.  CHEST:  Clear to auscultation.  EXTREMITIES:  No edema.  2+ pulses.  NEUROLOGIC:  Mental status; she is awake, alert and fully oriented.  Her  speech is fluent and not dysarthric, although sometimes she speaks in a  baby voice.  Cranial nerves; funduscopic examination is benign.  Pupils  equal and briskly reactive.  Extraocular movements are normal without  nystagmus.  Visual fields are full to confrontation.  Hearing is intact and  symmetric to finger rub.  Facial sensation is intact to pinprick.  Face,  tongue, and palate move normally and symmetrically.  Shoulder shrug strength  is normal.  Motor testing; normal bulk and tone.  On attempted motor  testing, there is 5/5 strength in neck flexion, shoulder shrug, deltoids and  biceps.  On the right, there is mild weakness in the triceps and little to  no voluntary movement in the hands, although the fingers do move a little  bit spontaneously.  Strength in the right upper extremity is full.  In the  lower extremities, she reports being completely unable to move either leg,  although again, the legs do occasionally move just a little bit  spontaneously.  She describes this as a spasm.  Sensation; she reports  diminished pinprick sensation over the ulnar aspect of the arm and forearm,  but otherwise intact to light touch, vibration and pinprick in the upper  extremities.  In the lower extremities, she reports decreased pinprick  sensation over both feet and the entirety of the forelegs, as well as absent  vibratory sensation in the toes and ankles and minimal vibratory sensation  in the right knee and absent in the left.  She denies proprioception sensation at the toe, ankle and knee.  Coordination; rapid movements  are  performed well on the right and not performed on the left.  Gait; not  tested.  Reflexes; 2+  and symmetric.  Toes are downgoing.   LABORATORY REVIEW:  MRI of the brain with contrast performed yesterday at  [facility] is not available for my review, although the report is  referenced and is said to demonstrate no abnormality.  The MRA is also  normal.   IMPRESSION:  Sudden onset of paraplegia and left upper extremity weakness.  This developed suddenly after the patient awoke with some back pain.  She  has nonorganic features on her examination and the suspicion is high that  this is a conversion reaction.  _________ organic pathology should certainly  be excluded.  MRI of the brain has ruled out stroke and makes multiple  sclerosis extremely unlikely, but she should have spinal imaging to exclude  a transverse myelitis and also to work up a possible source of the low back  pain.   PLAN:  We will admit her for observation.  We will proceed with an MRI of  the cervical, thoracic and lumbosacral spines.  If this demonstrates an  organic substrate for pathology, that will need to be treated.  If it is  negative, then I feel confident ascribing her symptoms to a psychological  basis and she will need psychiatric evaluation.                                                Michael L. Thad Ranger, M.D.    MLR/MEDQ  D:  05/20/2003  T:  05/21/2003  Job:  [id number]

## 2010-08-18 NOTE — Discharge Summary (Signed)
NAMEJAZMYNN, PHO NO.:  [id number]   MEDICAL RECORD NO.:  [id number]          PATIENT TYPE:  INP   LOCATION:  6742                         FACILITY:  [facility]   PHYSICIAN:  Jackie Plum, M.D.DATE OF BIRTH:  13-Aug-1980   DATE OF ADMISSION:  04/13/2005  DATE OF DISCHARGE:  04/17/2005                                 DISCHARGE SUMMARY   DISCHARGE DIAGNOSES:  1.  Sepsis of urinary source, improved.  2.  Hypokalemia, resolved.  3.  Altered mental status secondary to toxic metabolic encephalopathy      related to genitourinary sepsis, resolved.  4.  Dehydration, resolved.   DISCHARGE MEDICATIONS:  Patient is going to resume her Baclofen, Zyrtec,  Bactrim, citalopram, Neurontin, Oxybutynin, multivitamin, and Ditropan as  previously.   New medicines were Cipro 500 mg b.i.d. for 10 days, Dulcolax 10 mg daily as  needed, and Phenergan  25 mg every 6 hours as needed.   Patient is to follow up with Dr. Thad Ranger and with Dr. Patsi Sears as needed  and in 3-4 weeks, respectively.   Consults done with Dr. Thad Ranger and Dr. Anne Hahn, with lumbar puncture.   CONDITION ON DISCHARGE:  Improved and satisfactory.   The patient presented with headache and confusion.  She was brought to the  ED, whereupon a CT scan of the head was done, which was unremarkable.  She  did not have any meningismus but had leukocytosis of 3000.  The patient had  an LP done, which was negative for meningitis.  Her urinalysis was positive  for infection.  She was admitted for management of urosepsis with IV  antibiotic aggressively and IV fluids.  The patient had some nausea and  vomiting and received antiemetics and antinausea medications and showed  improvement.   Her electrolyte deficiencies were repleted.   With antibiotics and supportive care, the patient did well and was seen by  Dr. Jasmine Pang on April 17, 2005.  According to Dr. Jomarie Longs, the patient  was doing well, her vital signs  were stable, she had a regular rate and  rhythm on cardiac auscultation.  Her pulmonary auscultation was  unremarkable.  Abdomen was soft and nontender.  Her discharge sodium was  143, potassium 4, chloride 107, CO2 29, BUN 1, creatinine 0.5.  The patient  was discharged home in presumptively satisfactory condition.      Jackie Plum, M.D.  Electronically Signed    GO/MEDQ  D:  06/14/2005  T:  06/15/2005  Job:  [id number]

## 2010-08-18 NOTE — Consult Note (Signed)
NAMEREEGAN, MCTIGHE NO.:  [id number]   MEDICAL RECORD NO.:  [id number]          PATIENT TYPE:  INP   LOCATION:  6742                         FACILITY:  [facility]   PHYSICIAN:  Marlan Palau, M.D.  DATE OF BIRTH:  05-29-1980   DATE OF CONSULTATION:  04/13/2005  DATE OF DISCHARGE:                                   CONSULTATION   This is a neurology consult note on this patient done on April 13, 2005.   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  The patient was seen and evaluated initially by Dr. Kelli Hope.  This  patient has sustained permanent neurologic deficits, is paraplegic, has some  dysfunction of the left hand as well.  Patient has, however, been in her  usual state of health until last evening when she began having some problems  with a headache.  The patient was close to her baseline this morning but  still had a mild headache, but took a nap at around 11 a.m. and when  awakened around 3 p.m. could not get fully awake, was quite confused, out of  it, EMS was called, patient was brought to the emergency room for evaluation  where she has been stuporous.  CT scan of the head has been unremarkable.  Patient does not have meningismus, white count is 20,000.  Patient has  evidence of elevated white cells in the urine too numerous to count.  In the  past, workup for demyelinating disease has been negative.  Neurology is  asked to see this patient for further evaluation.   PAST MEDICAL HISTORY SIGNIFICANT FOR:  1.  History of transverse myelitis.  2.  Urinary tract infection, sepsis.  3.  Altered mental status.  4.  Suprapubic catheter placement.  5.  Neurogenic bladder.  6.  Tonsillectomy.  7.  Obesity.   MEDICATIONS PRIOR TO ADMISSION INCLUDE:  Ditropan 15 mg daily, Trimethoprim  100 mg a day,  Zyrtec 5 mg daily, Zoloft 100 mg a day,  Neurontin 600 mg  q.i.d., Baclofen 20 mg q.i.d.   THE PATIENT HAS AN ALLERGY TO IODINE.  Does not smoke or drink.   SOCIAL HISTORY:  [place],  is single, no children, lives with her mother.  Patient does work part-time.   FAMILY MEDICAL HISTORY:  Is notable that there is Parkinson's disease in the  patient's father, hypertension, heart disease, high cholesterol, diabetes  run in the family.  Obesity in the mother.   REVIEW OF SYSTEMS:  Cannot be obtained.  Patient is stuporous.   PHYSICAL EXAMINATION:  VITALS:  Blood pressure is 126/80, heart rate 113,  respiratory rate 14, temperature 101 rectally.  In general, this patient is  a moderately obese white female who is stuporous at the time of examination.  HEENT EXAMINATION:  Head is atraumatic.  EYES:  Pupils round, react to light, disks soft, flat bilaterally.  NECK:  Is supple, no bruits noted.  RESPIRATORY EXAMINATION:  Is roughly clear.  CARDIOVASCULAR EXAMINATION:  Reveals regular rate and rhythm, no obvious  murmurs, rubs noted.  EXTREMITIES:  Without significant edema.  NEUROLOGIC EXAMINATION:  Cranial nerves as above, facial symmetry is  present.  Patient will grimace some, will open her eyes when stimulated,  will not verbalize, will not follow commands.  Patient has good symmetry  with elevation of the arms, will not move the legs.  Deep tendon reflexes  are brisk throughout, toes are equivocal to upgoing bilaterally.  Decreased  motor tone noted in the legs.  Patient will have some minimal response to  deep pain stimulation on the arms.  Patient will not follow commands for  cerebellar testing.   LABORATORY VALUES:  Notable for urinalysis with specific gravity 1.021, pH  was 7.0.  Patient has too numerous to count white cells, , sodium 136,  potassium 4.2, chloride 107, CO2 24, glucose 135, BUN of 26, creatinine of  1.3, calcium 9.5, total protein 7.0, albumin of 4.1.   AST 28, ALT 17,  alkaline phosphatase of 89, total bili 1.1, white count of 20.7, hemoglobin  of 15.8, hematocrit of 45.5, MCV of 83.1, platelets of 393.  Chest x-ray is  unremarkable.  CT is as above.   IMPRESSION:  1.  Altered mental status, likely toxic metabolic encephalopathy associated      with GU sepsis.  2.  History of transverse myelitis.   This patient has no evidence of meningismus.  However, given the severe  alteration in mental status, that has been relatively of sudden onset today,  and fever, high white count, will consider lumbar puncture to fully exclude  meningitis.  Patient is on chronic antibiotic therapy with trimethoprim  which could suppress or partially suppress a bacterial meningitis.   PLAN:  1.  Lumbar puncture.  2.  Follow patient's clinical course with treatment of the urinary tract      infection, consider MRI of the brain or EG study if the patient's mental      status does not improve as expected.      Marlan Palau, M.D.  Electronically Signed     CKW/MEDQ  D:  04/13/2005  T:  04/15/2005  Job:  [id number]

## 2010-08-18 NOTE — Op Note (Signed)
NAMEHENNESY, [name] NO.:  [id number]   MEDICAL RECORD NO.:  [id number]          PATIENT TYPE:  INP   LOCATION:  1435                         [facility]ACILITY[facility]   PHYSICIAN:  Sigmund I. 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   PROCEDURES PERFORMED:  1. Open cystolithotomy.  2. Exploratory laparotomy with ileal loop formation.   SURGEON:  Dr. Patsi Sears.   ASSISTANT:  Dr. Marcia Brash.   ANESTHESIA:  General endotracheal anesthetic.   COMPLICATIONS:  None.   INDICATIONS:  This is a 30 year old female who is wheelchair bound with  poor eye hand coordination.  The patient was a victim of transverse  myelitis above 3 years ago with tragic quadriparesis ensuing due to  that.  The patient has total urinary incontinence and is being treated  by chronic Foley catheterization.  She also has multiple large bladder  stones.  After extensive counseling with her and her mother regarding  the therapeutic options for the bladder stones and incontinence and  given her quadriparesis situation, the patient then and her mother  elected for an ileal loop conduit with open cystolithotomy.  The patient  was admitted the day before to the hospital for a bowel prep.  She  subsequently underwent the surgery the next day.   DESCRIPTION OF PROCEDURE:  The patient was brought to the operating  room.  IV antibiotics were given within 30 minutes of skin incision.  General anesthesia was induced.  She was placed in the supine position.  The patient was prepped and draped in the normal sterile fashion.  A  time-out was taken to properly identify the patient and procedure going  to be done.  An abdominal incision was made just superior to her  umbilicus down to the level of the symphysis pubis.  Dissection was  carried down through Scarpa's fascia to the level of the anterior rectus  fascia.  The anterior and posterior sheath of the rectus fascia was  incised but the peritoneum was left intact.  The peritoneum was then  subsequently tagged cephalad.  The space of Retzius was entered and  dissection was carried bluntly down to the level of the bladder.  2-0  Vicryl sutures were then applied as stay sutures and a mid cystotomy  incision was then made.  The bladder had multiple stones that were  totally removed from the bladder and sent for pathological analysis and  chemical analysis.  Following removal of the stones, the cystotomy  incision was then closed by two layers in a running fashion using 3-0  Vicryl for the mucosa layer and 2-0 Vicryl for the seromuscular layer  that acted as an imbricating layer. While closing the bladder, a 16-  [place] Foley catheter was inserted and was put to drainage.  Following  closure of the bladder, the peritoneum was then opened and the  peritoneal reflections over the bladder were taken down on both the  right side and the left side.  Initially the ascending colon was then  mobilized along the line of Toldt and around the cecum.  This allowed  the medial mobilization of the ascending colon.  The mobilization was  done just short of the hepatic flexure.  The ascending colon and the  small bowels were then packed cephalad and to the left side, thus  exposing the right retroperitoneal space.  The right ureter was noted to  be crossing over the right common iliac vessel at the level of the  pelvic brim.  A Vessiloop was applied on the right ureter and the right  ureter was freed as proximal as possible (up to the mid-level of the  ureter) and was freed all the  way down to the bladder.  The ureter was  then clipped distally and cut.  The same process of mobilizing the  descending colon, medial reflection of the descending colon and packing  of the bowel cephalad onto the right was done.  The left ureter was then  mobilized in a similar fashion, clipped and cut.  Following isolation of  the ureters, the cecum and the ileocecal valve were identified, and  about 15 cm proximal to the ileocecal valve was marked by a silk suture.  About a 20-cm ileal segment was taken proximal to that point.  That 20-  cm segment of ileum was then isolated using GIA staples.  We made sure  that the mesentery of the ileal segment isolated was very well  vascularized.  Bowel continuity was then reestablished using the GIA  stapler and the TA staples.  Following the TA staples, the bowel was  imbricated with 3-0 silk pop-offs.  The mesenteric window created by  establishing continuity of the bowel was closed using 3-0 silk pop-offs  to prevent any internal herniation.  The mesentery of the bowel when  continuity was established was anterior to the ileal segment mesentery.  The left ureter was then tunneled behind the mesentery of the sigmoid  colon to the right side.  The ileal conduit was then oriented in an  isoperistaltic fashion and the stomal site of the ileal conduit was then  cut.  A large Tresa Endo was then inserted through the new site where the  left ureter will be reimplanted.  Potts scissor were then used to create  an enterotomy in the ileal conduit.  A single J stent was then tunneled  through the ileal conduit and brought out through the enterotomy  incision where the left ureter will be reimplanted.  The distal end of  the left ureter was then cut and spatulated, the stent was then tunneled  through the distal end of the left ureter, and the ureter was then  anastomosed to the ileal conduit using 4-0 Vicryl in a running fashion. Please note that there were  two running suture lines, one anterior and  one posterior, both 4-0 Vicryl.  Following the anastomosis, the stent  was then sutured loosely to the ileal conduit using 5-0 chromic.  The  same exact procedure was done to connect the right ureter to the ileal  conduit.  The stomal site that was marked by the stomal nurses on the  skin was then cut using a blade.  Dissection was then carried using  Bovie through the subcutaneous tissue to the level  of the fascia.  A  cruciate incision was made through the fascia and the stomal end of the  ileal conduit was then tunneled through the fascia using a Babcock.  2-0  Vicryl sutures were then used to anchor the antimesenteric body of the  ileal conduit to the rectus fascia.  The ileal conduit was then matured  in nippled stoma using __________ technique using 3-0 Vicryl in an  interrupted fashion.  The abdomen was then copiously irrigated.  The  fascia was then closed using #1 double-stranded PDS in a running  fashion.  The wound was then copiously irrigated and the skin was closed  with staples.  The ileal conduit was effluxing clear urine.  The stents  were in place and were also effluxing clear urine.  A stomal appliance  was then applied.  At this point, the procedure terminated with no  complications and the patient was extubated in the operating room and  taken in stable condition to PACU.  Please note that Dr. Patsi Sears was  present and participated in the entire procedure as he was the  responsible surgeon.     ______________________________  Cornelious Bryant, MD      Sigmund I. Patsi Sears, M.D.  Electronically Signed    SK/MEDQ  D:  05/27/2006  T:  05/28/2006  Job:  [id number]

## 2010-08-18 NOTE — Discharge Summary (Signed)
[name], [name]                        ACCOUNT NO.:  [id number]   MEDICAL RECORD NO.:  [id number]                   PATIENT TYPE:  INP   LOCATION:  3034                                 FACILITY:  [facility]   PHYSICIAN:  Casimiro Needle L. Thad Ranger, M.D.           DATE OF BIRTH:  12-Sep-1980   DATE OF ADMISSION:  05/20/2003  DATE OF DISCHARGE:  05/25/2003                                 DISCHARGE SUMMARY   ADMISSION DIAGNOSES:  1. Sudden onset paraplegia, left upper extremity weakness; question cervical     spine lesion versus psychogenic illness.  2. History of anxiety.   DISCHARGE DIAGNOSES:  1. Acute transverse myelitis with resultant paraplegia and left upper     extremity weakness, etiology uncertain.  2. History of anxiety.   CONDITION ON DISCHARGE:  Slightly improved.   DIET:  Regular.   ACTIVITY:  Ad lib per rehab team.   MEDICATIONS AT DISCHARGE:  1. Solu-Medrol 500 mg IV q.12h. to be completed at 10:00 p.m. today.  2. Multivitamin q.d.  3. Zoloft 100 mg q.d.  4. Protonix 40 mg q.d.  5. Claritin D one q.d.  6. Lovenox 40 mg subq q.d.  7. Colace 200 mg p.o. q.h.s.  8. Vicodin p.r.n.  9. Ambien p.r.n.   STUDIES:  1. MRI of the cervical spine performed May 20, 2003, demonstrating     acute swelling and hyperintensity of the cervical spinal cord from C4 to     C7 in the ventral white matter and adjacent gray matter.  2. MRI of the thoracic spine performed May 20, 2003, demonstrating the     same hyperintensity and no new lesions.  3. Chest x-ray, May 23, 2003:  No acute disease.   LABORATORY REVIEW:  CBC, February 17 and May 23, 2003, normal.  Chemistries, May 20, 2003, normal except for a minimally elevated  glucose of 102.  Admission TSH normal.  CSF studies:  1 white blood cell, 2  red blood cells, normal protein of 38, normal glucose of 71.  Gram stain and  culture negative.  Lyme titer negative.  Herpes simplex PCR negative.  PCR  for  Varicella and cytomegalovirus as well as oligoclonal banding studies are  pending at the time of discharge.  Urinalysis, May 23, 2003, positive  for glucose and nitrites, and many bacteria, with few white blood cells,  felt to represent a colonization.  CSF cytopathology, May 21, 2003:  Negative.   HOSPITAL COURSE:  Please see admission H&P for further details.  Briefly,  this is a 30 year old woman who awoke in the early morning hours two days  prior to admission with lower extremity weakness, and subsequently developed  paraplegia of the lower extremities.  She had been worked up with an MRI of  the brain, which was negative, and was subsequently sent to the office for  urgent neurologic consultation, from which she was admitted.  She had some  nonorganic features on her examination, and initially psychogenic weakness  was considered a possibility.  However, MRI of the cervical spine  demonstrated a clear abnormality consistent with her clinical presentation  of paraplegia and left upper extremity weakness, demonstrating transverse  myelitis.  MRI of the thoracic spine was also performed, but revealed no  lesions.  Subsequent workup included LP, with the above results.  Essentially no inflammation was noted, and no etiology of the findings was  elicited.  The patient remained stable throughout her stay.  She received  intravenous steroids for five days, did get a little bit of improvement in  terms of sensation in the lower extremities, as well as increased strength  in the upper extremity, at least proximally.  She did have a small fever of  101 on May 22, 2003.  At that time, chest x-ray and urinalysis were  done, which were negative, and she was not placed on antibiotics.  She  defervesced, and remained afebrile throughout her stay.  Physical and  occupational therapy were consulted to assist in her rehabilitation.  Both  recommended inpatient rehab stay, and the rehab  service was consulted for  this.  They were agreeable, and transfer to rehab was anticipated, at the  time of this dictation, on May 25, 2003.   FOLLOW-UP:  The patient will be followed by the neurology service on the  inpatient rehab service, and we will follow up on her pending labs.  Office  follow-up will be arranged at the time of discharge from the rehab service.                                                Michael L. Thad Ranger, M.D.    MLR/MEDQ  D:  05/25/2003  T:  05/25/2003  Job:  [id number]

## 2010-12-26 LAB — URINALYSIS, ROUTINE W REFLEX MICROSCOPIC
Bilirubin Urine: NEGATIVE
Glucose, UA: NEGATIVE
Hgb urine dipstick: NEGATIVE
Ketones, ur: NEGATIVE
Nitrite: NEGATIVE
Protein, ur: 30 — AB
Specific Gravity, Urine: 1.013
Urobilinogen, UA: 1
pH: 8.5 — ABNORMAL HIGH

## 2010-12-26 LAB — URINE CULTURE: Colony Count: >=100000

## 2010-12-26 LAB — URINE MICROSCOPIC-ADD ON

## 2010-12-26 LAB — PREGNANCY, URINE: Preg Test, Ur: NEGATIVE

## 2010-12-28 LAB — URINALYSIS, ROUTINE W REFLEX MICROSCOPIC
Nitrite: NEGATIVE
Specific Gravity, Urine: 1.017
pH: 8.5 — ABNORMAL HIGH

## 2010-12-28 LAB — URINE MICROSCOPIC-ADD ON

## 2010-12-28 LAB — PREGNANCY, URINE: Preg Test, Ur: NEGATIVE

## 2010-12-29 LAB — APTT: aPTT: 38 — ABNORMAL HIGH

## 2010-12-29 LAB — CBC
HCT: 40.5
HCT: 27.8 — ABNORMAL LOW
HCT: 31.4 — ABNORMAL LOW
HCT: 31.9 — ABNORMAL LOW
HCT: 33.3 — ABNORMAL LOW
HCT: 36.4
HCT: 37
Hemoglobin: 14
Hemoglobin: 10.3 — ABNORMAL LOW
Hemoglobin: 11.2 — ABNORMAL LOW
Hemoglobin: 12.4
Hemoglobin: 12.5
Hemoglobin: 12.5
MCHC: 33.8
MCHC: 33.9
MCV: 84.9
MCV: 86
MCV: 86.7
MCV: 87.8
MCV: 88
Platelets: 240
Platelets: 179
Platelets: 182
Platelets: 230
Platelets: 231
RBC: 3.46 — ABNORMAL LOW
RBC: 3.78 — ABNORMAL LOW
RBC: 4.22
RDW: 13.3
RDW: 13.3
RDW: 13.7
RDW: 13.8
WBC: 8.4
WBC: 4.5
WBC: 6.1
WBC: 6.2
WBC: 6.7

## 2010-12-29 LAB — DIFFERENTIAL
Basophils Absolute: 0.1
Basophils Relative: 1
Eosinophils Absolute: 0.2
Eosinophils Absolute: 0.1
Eosinophils Absolute: 0.1
Eosinophils Absolute: 0.2
Eosinophils Relative: 2
Eosinophils Relative: 2
Eosinophils Relative: 3
Lymphocytes Relative: 13
Lymphocytes Relative: 37
Lymphs Abs: 1.9
Lymphs Abs: 1.7
Lymphs Abs: 2.5
Monocytes Absolute: 0.4
Monocytes Absolute: 0.4
Monocytes Absolute: 0.4
Monocytes Absolute: 0.4
Monocytes Relative: 4
Monocytes Relative: 6
Monocytes Relative: 6
Neutro Abs: 3.7
Neutro Abs: 5.3
Neutrophils Relative %: 55

## 2010-12-29 LAB — COMPREHENSIVE METABOLIC PANEL
ALT: 16
ALT: 41 — ABNORMAL HIGH
Albumin: 4
Alkaline Phosphatase: 70
Alkaline Phosphatase: 83
BUN: 10
BUN: 16
CO2: 24
Chloride: 100
Chloride: 107
GFR calc non Af Amer: >60
Glucose, Bld: 89
Glucose, Bld: 97
Potassium: 3.8
Potassium: 4.2
Sodium: 139
Sodium: 141
Total Bilirubin: 0.6
Total Bilirubin: 0.7
Total Protein: 6.8
Total Protein: 6.9

## 2010-12-29 LAB — CULTURE, BLOOD (ROUTINE X 2): Culture: NO GROWTH

## 2010-12-29 LAB — WOUND CULTURE

## 2010-12-29 LAB — BASIC METABOLIC PANEL
BUN: 12
BUN: 2 — ABNORMAL LOW
BUN: 3 — ABNORMAL LOW
BUN: 8
BUN: 9
CO2: 23
Calcium: 7.9 — ABNORMAL LOW
Calcium: 8.5
Calcium: 8.6
Chloride: 103
Chloride: 107
Chloride: 109
Creatinine, Ser: 0.37 — ABNORMAL LOW
Creatinine, Ser: 0.49
GFR calc Af Amer: >60
GFR calc non Af Amer: >60
GFR calc non Af Amer: >60
GFR calc non Af Amer: >60
Glucose, Bld: 81
Glucose, Bld: 122 — ABNORMAL HIGH
Glucose, Bld: 76
Glucose, Bld: 79
Glucose, Bld: 84
Glucose, Bld: 86
Potassium: 3.9
Potassium: 3.3 — ABNORMAL LOW
Potassium: 3.8
Potassium: 4
Potassium: 4
Sodium: 137
Sodium: 140

## 2010-12-29 LAB — URINE MICROSCOPIC-ADD ON

## 2010-12-29 LAB — URINALYSIS, ROUTINE W REFLEX MICROSCOPIC
Bilirubin Urine: NEGATIVE
Glucose, UA: 100 — AB
Glucose, UA: NEGATIVE
Ketones, ur: NEGATIVE
Ketones, ur: NEGATIVE
Nitrite: NEGATIVE
Protein, ur: 30 — AB
Specific Gravity, Urine: 1.018
Specific Gravity, Urine: 1.019
Urobilinogen, UA: 1
pH: 8.5 — ABNORMAL HIGH
pH: 7

## 2010-12-29 LAB — FECAL LACTOFERRIN, QUANT: Fecal Lactoferrin: NEGATIVE

## 2010-12-29 LAB — URINE CULTURE: Colony Count: >=100000

## 2010-12-29 LAB — PROTIME-INR
INR: 1
INR: 1.2
Prothrombin Time: 16 — ABNORMAL HIGH

## 2010-12-29 LAB — POCT I-STAT, CHEM 8
BUN: 12
Calcium, Ion: 1.18
Chloride: 102
Potassium: 3.9

## 2010-12-29 LAB — CLOSTRIDIUM DIFFICILE EIA: C difficile Toxins A+B, EIA: NEGATIVE

## 2010-12-29 LAB — GC/CHLAMYDIA PROBE AMP, GENITAL
Chlamydia, DNA Probe: NEGATIVE
GC Probe Amp, Genital: NEGATIVE

## 2010-12-29 LAB — CORTISOL: Cortisol, Plasma: 3.6

## 2010-12-29 LAB — RETICULOCYTES
RBC.: 3.77 — ABNORMAL LOW
Retic Count, Absolute: 30.2

## 2010-12-29 LAB — MAGNESIUM: Magnesium: 2

## 2010-12-29 LAB — STOOL CULTURE

## 2010-12-29 LAB — IRON AND TIBC: TIBC: 208 — ABNORMAL LOW

## 2010-12-29 LAB — WET PREP, GENITAL: Yeast Wet Prep HPF POC: NONE SEEN

## 2010-12-29 LAB — PREGNANCY, URINE: Preg Test, Ur: NEGATIVE

## 2010-12-29 LAB — FOLATE: Folate: 20

## 2011-01-01 LAB — CBC
HCT: 35.8 — ABNORMAL LOW
HCT: 40.8
Hemoglobin: 12
Hemoglobin: 13.6
MCHC: 33.4
MCHC: 33.7
MCV: 86.5
MCV: 86.8
RBC: 3.7 — ABNORMAL LOW
RBC: 4.71
RDW: 13.5
WBC: 9.1

## 2011-01-01 LAB — BASIC METABOLIC PANEL
CO2: 27
CO2: 28
Calcium: 8.5
Chloride: 105
Chloride: 105
Chloride: 107
Creatinine, Ser: 0.43
GFR calc Af Amer: >60
GFR calc Af Amer: >60
GFR calc non Af Amer: >60
GFR calc non Af Amer: >60
Glucose, Bld: 120 — ABNORMAL HIGH
Potassium: 3.9
Potassium: 4.3
Sodium: 141
Sodium: 141

## 2011-01-01 LAB — URINALYSIS, ROUTINE W REFLEX MICROSCOPIC
Glucose, UA: NEGATIVE
Hgb urine dipstick: NEGATIVE
Hgb urine dipstick: NEGATIVE
Nitrite: POSITIVE — AB
Protein, ur: NEGATIVE
Specific Gravity, Urine: 1.016
Urobilinogen, UA: 0.2

## 2011-01-01 LAB — URINE MICROSCOPIC-ADD ON

## 2011-01-01 LAB — COMPREHENSIVE METABOLIC PANEL
ALT: 15
BUN: 11
CO2: 28
Calcium: 10
GFR calc non Af Amer: >60
Glucose, Bld: 87
Sodium: 140

## 2011-01-01 LAB — URINE CULTURE

## 2011-01-01 LAB — CULTURE, BLOOD (ROUTINE X 2): Culture: NO GROWTH

## 2011-01-02 LAB — DIFFERENTIAL
Basophils Absolute: 0
Basophils Relative: 0
Eosinophils Absolute: 0.1
Eosinophils Relative: 1
Lymphocytes Relative: 7 — ABNORMAL LOW
Lymphs Abs: 0.9
Monocytes Absolute: 0.3
Monocytes Relative: 2 — ABNORMAL LOW
Neutro Abs: 12.1 — ABNORMAL HIGH
Neutrophils Relative %: 90 — ABNORMAL HIGH

## 2011-01-02 LAB — COMPREHENSIVE METABOLIC PANEL
ALT: 10
AST: 15
AST: 18
Albumin: 4.5
Alkaline Phosphatase: 81
CO2: 25
Chloride: 100
Chloride: 103
Creatinine, Ser: 0.49
GFR calc Af Amer: >60
GFR calc non Af Amer: >60
Glucose, Bld: 101 — ABNORMAL HIGH
Potassium: 3.6
Potassium: 4.1
Sodium: 135
Total Bilirubin: 0.7
Total Protein: 6.9

## 2011-01-02 LAB — BASIC METABOLIC PANEL
BUN: 3 — ABNORMAL LOW
BUN: 3 — ABNORMAL LOW
CO2: 26
Calcium: 8.7
Creatinine, Ser: 0.43
GFR calc non Af Amer: >60
GFR calc non Af Amer: >60
Glucose, Bld: 84
Glucose, Bld: 89
Potassium: 3.6
Potassium: 3.8

## 2011-01-02 LAB — URINE CULTURE: Colony Count: >=100000

## 2011-01-02 LAB — COMPREHENSIVE METABOLIC PANEL WITH GFR
ALT: 11
AST: 16
Albumin: 3.5
Alkaline Phosphatase: 78
BUN: 6
CO2: 27
Calcium: 8.6
Chloride: 108
Creatinine, Ser: 0.44
GFR calc non Af Amer: >60
Glucose, Bld: 89
Potassium: 4.3
Sodium: 140
Total Bilirubin: 0.7
Total Protein: 5.6 — ABNORMAL LOW

## 2011-01-02 LAB — CBC
HCT: 32.8 — ABNORMAL LOW
HCT: 32.9 — ABNORMAL LOW
HCT: 40.7
Hemoglobin: 11.3 — ABNORMAL LOW
Hemoglobin: 11.4 — ABNORMAL LOW
Hemoglobin: 13.7
MCHC: 33.7
MCHC: 34.5
MCHC: 34.6
MCV: 85.8
MCV: 85.9
MCV: 87.8
Platelets: 171
Platelets: 200
Platelets: 265
RBC: 3.83 — ABNORMAL LOW
RBC: 4.74
RDW: 13.5
RDW: 13.5
RDW: 13.8
WBC: 13.4 — ABNORMAL HIGH
WBC: 8

## 2011-01-02 LAB — CULTURE, BLOOD (ROUTINE X 2)
Culture: NO GROWTH
Culture: NO GROWTH

## 2011-01-02 LAB — URINALYSIS, ROUTINE W REFLEX MICROSCOPIC
Bilirubin Urine: NEGATIVE
Glucose, UA: NEGATIVE
Hgb urine dipstick: NEGATIVE
Ketones, ur: NEGATIVE
Nitrite: NEGATIVE
Protein, ur: 30 — AB
Specific Gravity, Urine: 1.01
Urobilinogen, UA: 1
pH: 8.5 — ABNORMAL HIGH

## 2011-01-02 LAB — URINE MICROSCOPIC-ADD ON

## 2011-01-02 LAB — PROTIME-INR: INR: 1.1

## 2011-01-03 LAB — PROTIME-INR
INR: 1.1
Prothrombin Time: 14.4

## 2011-01-03 LAB — APTT: aPTT: 43 — ABNORMAL HIGH

## 2011-01-03 LAB — URINALYSIS, ROUTINE W REFLEX MICROSCOPIC
Glucose, UA: 100 — AB
Nitrite: POSITIVE — AB
Protein, ur: >300 — AB
Specific Gravity, Urine: 1.017
Urobilinogen, UA: 1
pH: 8.5 — ABNORMAL HIGH

## 2011-01-03 LAB — COMPREHENSIVE METABOLIC PANEL WITH GFR
Albumin: 3.6
Alkaline Phosphatase: 80
BUN: 14
Potassium: 4
Sodium: 139
Total Protein: 5.9 — ABNORMAL LOW

## 2011-01-03 LAB — COMPREHENSIVE METABOLIC PANEL
ALT: 11
AST: 14
CO2: 26
Calcium: 8.9
Chloride: 107
Creatinine, Ser: 0.49
GFR calc Af Amer: >60
GFR calc non Af Amer: >60
Glucose, Bld: 82
Total Bilirubin: 0.7

## 2011-01-03 LAB — CBC
HCT: 37.3
Hemoglobin: 12.5
MCHC: 33.5
MCV: 87
Platelets: 221
RBC: 4.29
RDW: 12.8
WBC: 6.4

## 2011-01-03 LAB — DIFFERENTIAL
Basophils Absolute: 0
Basophils Relative: 0
Eosinophils Absolute: 0.1
Eosinophils Relative: 2
Lymphocytes Relative: 31
Lymphs Abs: 2
Monocytes Absolute: 0.3
Monocytes Relative: 5
Neutro Abs: 3.9
Neutrophils Relative %: 62

## 2011-01-03 LAB — URINE MICROSCOPIC-ADD ON

## 2011-01-03 LAB — PREGNANCY, URINE: Preg Test, Ur: NEGATIVE

## 2011-01-09 LAB — COMPREHENSIVE METABOLIC PANEL
AST: 18
Albumin: 4
Calcium: 9.9
Creatinine, Ser: 0.45
GFR calc Af Amer: >60
GFR calc non Af Amer: >60
Sodium: 136
Total Protein: 6.6

## 2011-01-09 LAB — CBC
MCHC: 34.3
MCV: 78.1
Platelets: 227
RDW: 16.4 — ABNORMAL HIGH

## 2011-01-09 LAB — APTT: aPTT: 36

## 2011-01-11 LAB — URINE MICROSCOPIC-ADD ON

## 2011-01-11 LAB — URINE CULTURE: Colony Count: >=100000

## 2011-01-11 LAB — CBC
HCT: 29.1 — ABNORMAL LOW
HCT: 29.4 — ABNORMAL LOW
HCT: 29.9 — ABNORMAL LOW
HCT: 30.6 — ABNORMAL LOW
HCT: 38.7
Hemoglobin: 10.1 — ABNORMAL LOW
Hemoglobin: 12.7
Hemoglobin: 9.6 — ABNORMAL LOW
Hemoglobin: 9.7 — ABNORMAL LOW
Hemoglobin: 9.8 — ABNORMAL LOW
Hemoglobin: 9.9 — ABNORMAL LOW
MCHC: 32.9
MCHC: 33
MCHC: 33.1
MCHC: 33.2
MCHC: 34.1
MCV: 78.4
MCV: 78.9
Platelets: 365
Platelets: 415 — ABNORMAL HIGH
Platelets: 415 — ABNORMAL HIGH
RBC: 3.59 — ABNORMAL LOW
RBC: 3.81 — ABNORMAL LOW
RBC: 3.92
RBC: 4.87
RDW: 14.7 — ABNORMAL HIGH
RDW: 14.9 — ABNORMAL HIGH
RDW: 14.9 — ABNORMAL HIGH
RDW: 15.6 — ABNORMAL HIGH
WBC: 17.1 — ABNORMAL HIGH
WBC: 6.9
WBC: 8.9
WBC: 9.6

## 2011-01-11 LAB — URINALYSIS, ROUTINE W REFLEX MICROSCOPIC
Bilirubin Urine: NEGATIVE
Glucose, UA: NEGATIVE
Specific Gravity, Urine: 1.018
pH: 8

## 2011-01-11 LAB — I-STAT 8, (EC8 V) (CONVERTED LAB)
HCT: 41
Potassium: 3.9
TCO2: 24
pCO2, Ven: 34.4 — ABNORMAL LOW
pH, Ven: 7.442 — ABNORMAL HIGH

## 2011-01-11 LAB — BLOOD GAS, ARTERIAL
Acid-base deficit: 1.6
O2 Content: 2
O2 Saturation: 95.7
Patient temperature: 102.3

## 2011-01-11 LAB — WOUND CULTURE

## 2011-01-11 LAB — BASIC METABOLIC PANEL
BUN: 11
BUN: 5 — ABNORMAL LOW
CO2: 26
CO2: 26
CO2: 28
CO2: 30
Calcium: 8.2 — ABNORMAL LOW
Calcium: 8.3 — ABNORMAL LOW
Calcium: 8.6
Calcium: 8.7
Calcium: 9
Chloride: 111
Creatinine, Ser: 0.39 — ABNORMAL LOW
Creatinine, Ser: 0.73
GFR calc Af Amer: >60
GFR calc Af Amer: >60
GFR calc Af Amer: >60
GFR calc Af Amer: >60
GFR calc non Af Amer: >60
GFR calc non Af Amer: >60
GFR calc non Af Amer: >60
GFR calc non Af Amer: >60
GFR calc non Af Amer: >60
Glucose, Bld: 92
Glucose, Bld: 99
Potassium: 4
Potassium: 4.1
Potassium: 4.3
Potassium: 4.4
Sodium: 138
Sodium: 139
Sodium: 140
Sodium: 140
Sodium: 142
Sodium: 142

## 2011-01-11 LAB — DIFFERENTIAL
Basophils Absolute: 0
Basophils Relative: 0
Eosinophils Relative: 0
Lymphocytes Relative: 13
Lymphocytes Relative: 3 — ABNORMAL LOW
Lymphs Abs: 0.5 — ABNORMAL LOW
Monocytes Absolute: 0.4
Monocytes Relative: 3
Monocytes Relative: 6
Neutro Abs: 5.8
Neutrophils Relative %: 81 — ABNORMAL HIGH

## 2011-01-11 LAB — HEPATIC FUNCTION PANEL
ALT: 13
Alkaline Phosphatase: 92
Indirect Bilirubin: 0.7
Total Protein: 7.1

## 2011-01-11 LAB — CULTURE, BLOOD (ROUTINE X 2): Culture: NO GROWTH

## 2011-07-25 ENCOUNTER — Emergency Department (HOSPITAL_COMMUNITY)
Admission: EM | Admit: 2011-07-25 | Discharge: 2011-07-25 | Disposition: A | Discharge status: Home / Self Care | Payer: Medicaid Other | Attending: Emergency Medicine | Admitting: Emergency Medicine

## 2011-07-25 ENCOUNTER — Encounter (HOSPITAL_COMMUNITY): Payer: Self-pay | Admitting: *Deleted

## 2011-07-25 ENCOUNTER — Emergency Department (HOSPITAL_COMMUNITY): Payer: Medicaid Other

## 2011-07-25 DIAGNOSIS — R0602 Shortness of breath: Secondary | ICD-10-CM | POA: Insufficient documentation

## 2011-07-25 DIAGNOSIS — F4310 Post-traumatic stress disorder, unspecified: Secondary | ICD-10-CM | POA: Insufficient documentation

## 2011-07-25 DIAGNOSIS — Z7401 Bed confinement status: Secondary | ICD-10-CM | POA: Insufficient documentation

## 2011-07-25 DIAGNOSIS — R059 Cough, unspecified: Secondary | ICD-10-CM | POA: Insufficient documentation

## 2011-07-25 DIAGNOSIS — R51 Headache: Secondary | ICD-10-CM | POA: Insufficient documentation

## 2011-07-25 DIAGNOSIS — F3289 Other specified depressive episodes: Secondary | ICD-10-CM | POA: Insufficient documentation

## 2011-07-25 DIAGNOSIS — R5383 Other fatigue: Secondary | ICD-10-CM | POA: Insufficient documentation

## 2011-07-25 DIAGNOSIS — R05 Cough: Secondary | ICD-10-CM | POA: Insufficient documentation

## 2011-07-25 DIAGNOSIS — R110 Nausea: Secondary | ICD-10-CM

## 2011-07-25 DIAGNOSIS — R5381 Other malaise: Secondary | ICD-10-CM | POA: Insufficient documentation

## 2011-07-25 DIAGNOSIS — F329 Major depressive disorder, single episode, unspecified: Secondary | ICD-10-CM | POA: Insufficient documentation

## 2011-07-25 DIAGNOSIS — G35 Multiple sclerosis: Secondary | ICD-10-CM | POA: Insufficient documentation

## 2011-07-25 HISTORY — DX: Multiple sclerosis, unspecified: G35.D

## 2011-07-25 HISTORY — DX: Multiple sclerosis: G35

## 2011-07-25 HISTORY — DX: Paraplegia, unspecified: G82.20

## 2011-07-25 HISTORY — DX: Major depressive disorder, single episode, unspecified: F32.9

## 2011-07-25 HISTORY — DX: Depression, unspecified: F32.A

## 2011-07-25 HISTORY — DX: Post-traumatic stress disorder, unspecified: F43.10

## 2011-07-25 LAB — DIFFERENTIAL
Basophils Absolute: 0 10*3/uL (ref 0.0–0.1)
Basophils Relative: 0 % (ref 0–1)
Lymphocytes Relative: 22 % (ref 12–46)
Neutro Abs: 6.4 10*3/uL (ref 1.7–7.7)
Neutrophils Relative %: 71 % (ref 43–77)

## 2011-07-25 LAB — COMPREHENSIVE METABOLIC PANEL
ALT: 23 U/L (ref 0–35)
AST: 24 U/L (ref 0–37)
Albumin: 4.1 g/dL (ref 3.5–5.2)
CO2: 29 mEq/L (ref 19–32)
Chloride: 102 mEq/L (ref 96–112)
Creatinine, Ser: 0.54 mg/dL (ref 0.50–1.10)
GFR calc non Af Amer: >90 mL/min (ref >90)
Sodium: 140 mEq/L (ref 135–145)
Total Bilirubin: 0.2 mg/dL — ABNORMAL LOW (ref 0.3–1.2)

## 2011-07-25 LAB — D-DIMER, QUANTITATIVE: D-Dimer, Quant: <0.22 ug/mL-FEU (ref 0.00–0.48)

## 2011-07-25 LAB — URINALYSIS, ROUTINE W REFLEX MICROSCOPIC
Glucose, UA: NEGATIVE mg/dL
Hgb urine dipstick: NEGATIVE
Protein, ur: NEGATIVE mg/dL
pH: 8.5 — ABNORMAL HIGH (ref 5.0–8.0)

## 2011-07-25 LAB — URINE MICROSCOPIC-ADD ON

## 2011-07-25 LAB — CBC
MCHC: 33.4 g/dL (ref 30.0–36.0)
Platelets: 317 10*3/uL (ref 150–400)
RDW: 13.6 % (ref 11.5–15.5)
WBC: 9 10*3/uL (ref 4.0–10.5)

## 2011-07-25 MED ORDER — IBUPROFEN 800 MG PO TABS
800.0000 mg | ORAL_TABLET | Freq: Once | ORAL | Status: AC
  Administered 2011-07-25: 800 mg via ORAL
  Filled 2011-07-25: qty 1

## 2011-07-25 MED ORDER — ONDANSETRON 8 MG PO TBDP: 8.0000 mg | ORAL_TABLET | Freq: Three times a day (TID) | ORAL | Status: AC | PRN

## 2011-07-25 MED ORDER — SODIUM CHLORIDE 0.9 % IV BOLUS (SEPSIS)
500.0000 mL | Freq: Once | INTRAVENOUS | Status: AC
  Administered 2011-07-25: 500 mL via INTRAVENOUS

## 2011-07-25 NOTE — ED Provider Notes (Signed)
History     CSN: [id number]  Arrival date & time 07/25/11  4696   First MD Initiated Contact with Patient 07/25/11 1920      Chief Complaint  Patient presents with  . Shortness of Breath    pt c/o SOB since last night, pt has MS and is bedridden, states that her home health aide thinks her SOB is from rapid weight gain. no distress noted in triage. pt speaking complete sentences. 02sats 97% on RA  . Nausea     HPI  Pt has been having trouble with her breathing since yesterday.  It feels worse when she lays flat.  She has been coughing and not feeling very well.  She has general malaise and headaches.  She has not had any fever.  No vomiting or diarrhea.  She had had abdominal cramping with these symptoms.  Pt has MS and is bedridden.   Past Medical History  Diagnosis Date  . Multiple sclerosis   . Depression   . PTSD (post-traumatic stress disorder)     Past Surgical History  Procedure Date  . Tonsillectomy   . Cholecystectomy     History reviewed. No pertinent family history.  History  Substance Use Topics  . Smoking status: Not on file  . Smokeless tobacco: Not on file  . Alcohol Use: No    OB History    Grav Para Term Preterm Abortions TAB SAB Ect Mult Living                  Review of Systems  All other systems reviewed and are negative.    Allergies  Aspirin; Iodine; Latex; Shellfish-derived products; and Vancomycin  Home Medications   Current Outpatient Rx  Name Route Sig Dispense Refill  . AMPICILLIN 500 MG PO CAPS Oral Take 500 mg by mouth 2 (two) times daily.    Marland Kitchen BACLOFEN 20 MG PO TABS Oral Take 20 mg by mouth 3 (three) times daily.    Marland Kitchen DIAZEPAM 5 MG PO TABS Oral Take 5-10 mg by mouth every 8 (eight) hours as needed. 1 tab bid and 2 tab qhs    . DIPHENHYDRAMINE HCL 25 MG PO TABS Oral Take 50 mg by mouth every 6 (six) hours as needed. For allergic reaction.    Marland Kitchen FLUTICASONE PROPIONATE 50 MCG/ACT NA SUSP Nasal Place 2 sprays into the nose  daily.    Marland Kitchen GABAPENTIN 800 MG PO TABS Oral Take 800 mg by mouth 3 (three) times daily.    Marland Kitchen LAMOTRIGINE 25 MG PO TABS Oral Take 125 mg by mouth daily.    Marland Kitchen LORATADINE 10 MG PO TABS Oral Take 10 mg by mouth daily.    . LUBIPROSTONE 8 MCG PO CAPS Oral Take 8 mcg by mouth 2 (two) times daily with a meal.    . ADULT MULTIVITAMIN W/MINERALS CH Oral Take 1 tablet by mouth daily.    Marland Kitchen OMEPRAZOLE 40 MG PO CPDR Oral Take 40 mg by mouth daily.    Marland Kitchen ONDANSETRON HCL 8 MG PO TABS Oral Take 8 mg by mouth every 8 (eight) hours as needed. For nausea.    Marland Kitchen RIZATRIPTAN BENZOATE 10 MG PO TBDP Oral Take 10 mg by mouth as needed. For migraines; may repeat in 1 hour if needed.    . SERTRALINE HCL 100 MG PO TABS Oral Take 100 mg by mouth daily.      BP 104/66  Pulse 90  Temp(Src) 98.6 F (37 C) (Oral)  Resp 20  SpO2 96%  Physical Exam  Nursing note and vitals reviewed. Constitutional: She appears well-developed and well-nourished. No distress.  HENT:  Head: Normocephalic and atraumatic.  Right Ear: External ear normal.  Left Ear: External ear normal.  Eyes: Conjunctivae are normal. Right eye exhibits no discharge. Left eye exhibits no discharge. No scleral icterus.  Neck: Neck supple. No tracheal deviation present.  Cardiovascular: Normal rate, regular rhythm and intact distal pulses.   Pulmonary/Chest: Effort normal and breath sounds normal. No stridor. No respiratory distress. She has no wheezes. She has no rales.  Abdominal: Soft. Bowel sounds are normal. She exhibits no distension. There is no tenderness. There is no rebound and no guarding.  Musculoskeletal: She exhibits no edema and no tenderness.  Neurological: She is alert. She is not disoriented. She displays atrophy. She displays no tremor. No cranial nerve deficit ( no gross defecits noted) or sensory deficit. She exhibits abnormal muscle tone. She displays no seizure activity.  Skin: Skin is warm and dry. No rash noted.  Psychiatric: She has a  normal mood and affect.    ED Course  Procedures (including critical care time)  Rate: 86  Rhythm: normal sinus rhythm  QRS Axis: normal  Intervals: normal  ST/T Wave abnormalities: nonspecific T wave changes  Conduction Disutrbances: none  Narrative Interpretation:   Old EKG Reviewed: none available  Labs Reviewed  COMPREHENSIVE METABOLIC PANEL - Abnormal; Notable for the following:    Total Bilirubin 0.2 (*)    All other components within normal limits  URINALYSIS, ROUTINE W REFLEX MICROSCOPIC - Abnormal; Notable for the following:    APPearance CLOUDY (*)    pH 8.5 (*)    Leukocytes, UA TRACE (*)    All other components within normal limits  URINE MICROSCOPIC-ADD ON - Abnormal; Notable for the following:    Squamous Epithelial / LPF FEW (*)    Bacteria, UA MANY (*)    All other components within normal limits  CBC  DIFFERENTIAL  LIPASE, BLOOD  D-DIMER, QUANTITATIVE  URINE CULTURE   Dg Chest 2 View  07/25/2011  *RADIOLOGY REPORT*  Clinical Data: Shortness of breath and nausea.  CHEST - 2 VIEW  Comparison: Two-view chest 01/17/2009.  Findings: The heart size is normal.  The lungs are clear.  The patient is rotated on both the AP and lateral views.  The visualized soft tissues and bony thorax are unremarkable.  IMPRESSION: Negative chest.  Original Report Authenticated By: Jamesetta Orleans. MATTERN, M.D.     1. Nausea       MDM  The patient does not have any evidence of pneumonia on the x-ray. The blood tests are reassuring. Her urinalysis does show 7-10 white blood cells and many bacteria a specimen was obtained from a urostomy. It is not clear to me that this is an infection. I reviewed her prior labs in the past she has had a very similar appearing urinalysis. I discussed the findings with the patient and her mother. I will send off a culture and have her followup with her primary doctor. She is feeling better at this time does not have any dyspnea or difficulty  breathing        Celene Kras, MD 07/25/11 2310

## 2011-07-25 NOTE — ED Notes (Signed)
Pt reports increasing sob starting yesterday with distended abdomen. Reports nausea, no vomiting. Reports having regular BM. Pt is A/O x4. Skin warm and dry. Respirations even and unlabored. NAD noted at this time.

## 2011-07-25 NOTE — Discharge Instructions (Signed)
Nausea, Adult Nausea is the feeling that you have an upset stomach or have to vomit. Nausea by itself is not likely a serious concern, but it may be an early sign of more serious medical problems. As nausea gets worse, it can lead to vomiting. If vomiting develops, there is the risk of dehydration.  CAUSES   Viral infections.   Food poisoning.   Medicines.   Pregnancy.   Motion sickness.   Migraine headaches.   Emotional distress.   Severe pain from any source.   Alcohol intoxication.  HOME CARE INSTRUCTIONS  Get plenty of rest.   Ask your caregiver about specific rehydration instructions.   Eat small amounts of food and sip liquids more often.   Take all medicines as told by your caregiver.  SEEK MEDICAL CARE IF:  You have not improved after 2 days, or you get worse.   You have a headache.  SEEK IMMEDIATE MEDICAL CARE IF:   You have a fever.   You faint.   You keep vomiting or have blood in your vomit.   You are extremely weak or dehydrated.   You have dark or bloody stools.   You have severe chest or abdominal pain.  MAKE SURE YOU:  Understand these instructions.   Will watch your condition.   Will get help right away if you are not doing well or get worse.  Document Released: 04/26/2004 Document Revised: 03/08/2011 Document Reviewed: 11/29/2010 [employer] Patient Information 2012 [employer]. 

## 2011-07-28 LAB — URINE CULTURE

## 2011-07-29 NOTE — ED Notes (Signed)
+   Urine Chart sent to EDP office for review. 

## 2011-08-03 NOTE — ED Notes (Signed)
Chart returned from EDP office with rx  Written by Trixie Dredge for Keflex-500 mg po TID x 7 days, called to  [facility] [phone] by Steward Ros RN.

## 2011-10-16 ENCOUNTER — Emergency Department (HOSPITAL_COMMUNITY)
Admission: EM | Admit: 2011-10-16 | Discharge: 2011-10-16 | Disposition: A | Discharge status: Home / Self Care | Payer: Medicaid Other | Attending: Emergency Medicine | Admitting: Emergency Medicine

## 2011-10-16 ENCOUNTER — Emergency Department (HOSPITAL_COMMUNITY): Payer: Medicaid Other

## 2011-10-16 ENCOUNTER — Encounter (HOSPITAL_COMMUNITY): Payer: Self-pay

## 2011-10-16 DIAGNOSIS — G35 Multiple sclerosis: Secondary | ICD-10-CM

## 2011-10-16 DIAGNOSIS — R079 Chest pain, unspecified: Secondary | ICD-10-CM | POA: Insufficient documentation

## 2011-10-16 DIAGNOSIS — G8220 Paraplegia, unspecified: Secondary | ICD-10-CM | POA: Insufficient documentation

## 2011-10-16 DIAGNOSIS — G373 Acute transverse myelitis in demyelinating disease of central nervous system: Secondary | ICD-10-CM

## 2011-10-16 DIAGNOSIS — R9431 Abnormal electrocardiogram [ECG] [EKG]: Secondary | ICD-10-CM

## 2011-10-16 DIAGNOSIS — Z936 Other artificial openings of urinary tract status: Secondary | ICD-10-CM | POA: Insufficient documentation

## 2011-10-16 DIAGNOSIS — G35D Multiple sclerosis, unspecified: Secondary | ICD-10-CM

## 2011-10-16 DIAGNOSIS — R0602 Shortness of breath: Secondary | ICD-10-CM | POA: Insufficient documentation

## 2011-10-16 DIAGNOSIS — R0789 Other chest pain: Secondary | ICD-10-CM

## 2011-10-16 DIAGNOSIS — R42 Dizziness and giddiness: Secondary | ICD-10-CM | POA: Insufficient documentation

## 2011-10-16 DIAGNOSIS — N390 Urinary tract infection, site not specified: Secondary | ICD-10-CM

## 2011-10-16 DIAGNOSIS — R5381 Other malaise: Secondary | ICD-10-CM | POA: Insufficient documentation

## 2011-10-16 DIAGNOSIS — Z872 Personal history of diseases of the skin and subcutaneous tissue: Secondary | ICD-10-CM

## 2011-10-16 DIAGNOSIS — Z79899 Other long term (current) drug therapy: Secondary | ICD-10-CM | POA: Insufficient documentation

## 2011-10-16 LAB — COMPREHENSIVE METABOLIC PANEL
ALT: 21 U/L (ref 0–35)
AST: 25 U/L (ref 0–37)
Albumin: 4.1 g/dL (ref 3.5–5.2)
Alkaline Phosphatase: 98 U/L (ref 39–117)
BUN: 12 mg/dL (ref 6–23)
CO2: 25 mEq/L (ref 19–32)
Calcium: 9.6 mg/dL (ref 8.4–10.5)
Chloride: 100 mEq/L (ref 96–112)
Creatinine, Ser: 0.37 mg/dL — ABNORMAL LOW (ref 0.50–1.10)
GFR calc Af Amer: >90 mL/min (ref >90)
GFR calc non Af Amer: >90 mL/min (ref >90)
Glucose, Bld: 172 mg/dL — ABNORMAL HIGH (ref 70–99)
Potassium: 3.5 mEq/L (ref 3.5–5.1)
Sodium: 138 mEq/L (ref 135–145)
Total Bilirubin: 0.4 mg/dL (ref 0.3–1.2)
Total Protein: 7.2 g/dL (ref 6.0–8.3)

## 2011-10-16 LAB — CBC WITH DIFFERENTIAL/PLATELET
Basophils Absolute: 0 10*3/uL (ref 0.0–0.1)
Basophils Relative: 0 % (ref 0–1)
Eosinophils Absolute: 0.1 10*3/uL (ref 0.0–0.7)
Eosinophils Relative: 2 % (ref 0–5)
HCT: 41.2 % (ref 36.0–46.0)
Hemoglobin: 14 g/dL (ref 12.0–15.0)
Lymphocytes Relative: 19 % (ref 12–46)
Lymphs Abs: 1.3 10*3/uL (ref 0.7–4.0)
MCH: 28.1 pg (ref 26.0–34.0)
MCHC: 34 g/dL (ref 30.0–36.0)
MCV: 82.6 fL (ref 78.0–100.0)
Monocytes Absolute: 0.3 10*3/uL (ref 0.1–1.0)
Monocytes Relative: 5 % (ref 3–12)
Neutro Abs: 5.1 10*3/uL (ref 1.7–7.7)
Neutrophils Relative %: 74 % (ref 43–77)
Platelets: 173 10*3/uL (ref 150–400)
RBC: 4.99 MIL/uL (ref 3.87–5.11)
RDW: 13.5 % (ref 11.5–15.5)
WBC: 6.8 10*3/uL (ref 4.0–10.5)

## 2011-10-16 LAB — MAGNESIUM: Magnesium: 1.9 mg/dL (ref 1.5–2.5)

## 2011-10-16 LAB — CARDIAC PANEL(CRET KIN+CKTOT+MB+TROPI)
CK, MB: 0.9 ng/mL (ref 0.3–4.0)
Relative Index: INVALID (ref 0.0–2.5)
Total CK: 27 U/L (ref 7–177)
Troponin I: <0.3 ng/mL (ref <0.30)

## 2011-10-16 LAB — D-DIMER, QUANTITATIVE: D-Dimer, Quant: 0.36 ug/mL-FEU (ref 0.00–0.48)

## 2011-10-16 MED ORDER — HYDROCODONE-ACETAMINOPHEN 5-325 MG PO TABS: 1.0000 | ORAL_TABLET | Freq: Four times a day (QID) | ORAL | Status: AC | PRN

## 2011-10-16 MED ORDER — POTASSIUM CHLORIDE CRYS ER 20 MEQ PO TBCR
40.0000 meq | EXTENDED_RELEASE_TABLET | Freq: Once | ORAL | Status: AC
  Administered 2011-10-16: 40 meq via ORAL
  Filled 2011-10-16: qty 2

## 2011-10-16 NOTE — Consult Note (Signed)
Cardiology Consult Note  Admit date: 10/16/2011 Name: [name] 31 y.o.  female DOB:  [birth year] MRN:  [id number]  Today's date:  10/16/2011  Referring Physician:   [facility] Primary Physician:   Dr. Blair Heys  Reason for Consultation:   Abnormal EKG, possible prolonged QT, chest pain  IMPRESSIONS: 1. Chest pain with atypical features but with a family history of cardiac disease 2. I do not think that she has a severe prolonged QT on her EKG. She does have nonspecific changes with some ST depression 3. Paraplegia 4. History of gastric ulcer  RECOMMENDATION:  1. Unclear whether or this is ischemic type chest pain or not. She does have an abnormal baseline EKG with some diffuse ST depression. 2. Consider serial cardiac enzymes to be sure this is not an ischemic event 3. Replacement of potassium with repeat EKG following potassium replacement 4. I will be happy to see in consultation and follow along with you. 5. She'll need to have lipid panel, and repeat glucose since she has resting hyperglycemia 6. Depending on clinical course and followup EKG in whether or not she has continued pain could consider whether or not to do further ischemic workup.  HISTORY:  This nice 31 year old female has a history of transverse myelitis and 2005 and has had significant paraplegia since then. She has had a previous cystectomy because of a neurogenic bladder and is also had previous surgery for gallbladder disease and a previous gastric ulcer. She presented to the emergency room after having had 3 hours of substernal chest discomfort this morning associated with shortness of breath and was brought to the emergency room. The pain was described as slightly pleuritic. It has slowly eased off since she came to the emergency room and one troponin was negative. The mother was concerned because there is a history of premature cardiac disease.  I was called by the physician's assistant  with concern over a greatly prolonged QT interval and came and saw the patient. Her chest discomfort was resolving at the time I saw her. The EKG showed some diffuse ST and T wave changes but I did not think the QT was significantly prolonged. She was mildly hypokalemic and recommended replacement of the potassium and repeat EKG and serial enzymes.   Past Medical History  Diagnosis Date  . Multiple sclerosis   . Depression   . PTSD (post-traumatic stress disorder)   . Paraplegia       Past Surgical History  Procedure Date  . Tonsillectomy   . Cholecystectomy   . Cystectomy w/ ureteroileal conduit   . Wisdom tooth extraction    Allergies:   is allergic to aspirin; iodine; latex; shellfish-derived products; and vancomycin.   Medications: Prior to Admission medications   Medication Sig Start Date End Date Taking? Authorizing Provider  baclofen (LIORESAL) 20 MG tablet Take 20 mg by mouth 3 (three) times daily.   Yes Historical Provider, MD  diazepam (VALIUM) 5 MG tablet Take 5-10 mg by mouth every 8 (eight) hours as needed. Take 1 tablet in AM, 1 tablet in PM, and 2 tablets at bedtime   Yes Historical Provider, MD  fluticasone (FLONASE) 50 MCG/ACT nasal spray Place 2 sprays into the nose daily.   Yes Historical Provider, MD  gabapentin (NEURONTIN) 800 MG tablet Take 800 mg by mouth 3 (three) times daily.   Yes Historical Provider, MD  ibuprofen (ADVIL,MOTRIN) 200 MG tablet Take 200 mg by mouth every 6 (six) hours as needed.  For pain   Yes Historical Provider, MD  lamoTRIgine (LAMICTAL) 25 MG tablet Take 125 mg by mouth daily.   Yes Historical Provider, MD  loratadine (CLARITIN) 10 MG tablet Take 10 mg by mouth daily.   Yes Historical Provider, MD  lubiprostone (AMITIZA) 8 MCG capsule Take 8 mcg by mouth 2 (two) times daily with a meal.   Yes Historical Provider, MD  Multiple Vitamin (MULITIVITAMIN WITH MINERALS) TABS Take 1 tablet by mouth daily.   Yes Historical Provider, MD  omeprazole  (PRILOSEC) 40 MG capsule Take 40 mg by mouth daily.   Yes Historical Provider, MD  rizatriptan (MAXALT-MLT) 10 MG disintegrating tablet Take 10 mg by mouth as needed. For migraines; may repeat in 1 hour if needed.   Yes Historical Provider, MD  sertraline (ZOLOFT) 100 MG tablet Take 100 mg by mouth daily.   Yes Historical Provider, MD  HYDROcodone-acetaminophen (NORCO/VICODIN) 5-325 MG per tablet Take 1 tablet by mouth every 6 (six) hours as needed for pain. 10/16/11 10/26/11  Jaci Carrel, PA-C    Family History: Family Status  Relation Status Death Age  . Father Alive     history of premature CAD  . Mother Alive    Social History:   reports that she has never smoked. She has never used smokeless tobacco. She reports that she does not drink alcohol or use illicit drugs.   History   Social History Narrative   Lives with parents.  All disabled.  Attended [employer] until she got transverse myelitis.     Physical Exam: Blood pressure 97/59, pulse 75, temperature 98.6 F (37 C), temperature source Oral, resp. rate 18, last menstrual period 09/22/2011, SpO2 97.00%.    General appearance: small framed female with paraplegia but who is pleasant, polite and cooperative and appears in no acute distress Neck: no adenopathy, no carotid bruit, no JVD and supple, symmetrical, trachea midline Lungs: clear to auscultation bilaterally Heart: regular rate and rhythm, S1, S2 normal, no murmur, click, rub or gallop Abdomen: soft, non-tender; bowel sounds normal; no masses,  no organomegaly Pelvic: deferred Extremities: trace edema Pulses: 2+ and symmetric Neurologic: lower extremity paresis bilaterally, somewhat clumsy movements of arms but reasonable strength  Labs: CBC  Basename 10/16/11 1111  WBC 6.8  RBC 4.99  HGB 14.0  HCT 41.2  PLT 173  MCV 82.6  MCH 28.1  MCHC 34.0  RDW 13.5  LYMPHSABS 1.3  MONOABS 0.3  EOSABS 0.1  BASOSABS 0.0   CMP   Basename 10/16/11 1111  NA 138  K 3.5  CL  100  CO2 25  GLUCOSE 172*  BUN 12  CREATININE 0.37*  CALCIUM 9.6  PROT 7.2  ALBUMIN 4.1  AST 25  ALT 21  ALKPHOS 98  BILITOT 0.4  GFRNONAA >90  GFRAA >90   Cardiac Panel (last 3 results)  Basename 10/16/11 1112  CKTOTAL 27  CKMB 0.9  TROPONINI <0.30  RELINDX RELATIVE INDEX IS INVALID     Radiology: Clear lungs  EKG: Sinus tachycardia with ST depression diffusely. The QT interval appeared to be in the 0.4 range.  Signed:  Darden Palmer MD [facility]   Cardiology Consultant  10/16/2011, 5:21 PM

## 2011-10-16 NOTE — ED Provider Notes (Signed)
Medical screening examination/treatment/procedure(s) were conducted as a shared visit with non-physician practitioner(s) and myself.  I personally evaluated the patient during the encounter  Hx MS, substernal chest pain and pressure x 3 hours this morning. Initial EKG concerning for prolonged QT but improved on repeat.  D-dimer negative.  Glynn Octave, MD 10/16/11 [phone]

## 2011-10-16 NOTE — ED Provider Notes (Signed)
History     CSN: [id number]  Arrival date & time 10/16/11  1036   First MD Initiated Contact with Patient 10/16/11 1126      Chief Complaint  Patient presents with  . Chest Pain    (Consider location/radiation/quality/duration/timing/severity/associated sxs/prior treatment) HPI Comments: Patient with a history of multiple sclerosis and PTSD presents emergency department with chief complaint of chest pain.  Onset of symptoms began at 9 a.m. this morning and described as a pressure "like a 17-year-old was sitting on my chest" location of pain is substernal and associated with some shortness of breath mild dizziness, and general fatigue and.  Patient states severity was 10/10 but is now 7/10 and gradually getting better.  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  Patient didn't have a history of cardiac issues or chest pain.  Family history is significant for father with 3 MIs and triple bypass at the age of 30.  Patient denies any syncope, heart palpitations, cough, hemoptysis, hearing loss, diaphoresis, fever, night sweats, chills.  The history is provided by the patient.    Past Medical History  Diagnosis Date  . Multiple sclerosis   . Depression   . PTSD (post-traumatic stress disorder)   . Paraplegia     Past Surgical History  Procedure Date  . Tonsillectomy   . Cholecystectomy     No family history on file.  History  Substance Use Topics  . Smoking status: Not on file  . Smokeless tobacco: Not on file  . Alcohol Use: No    OB History    Grav Para Term Preterm Abortions TAB SAB Ect Mult Living                  Review of Systems  Constitutional: Positive for fatigue. Negative for fever, chills, diaphoresis and appetite change.  HENT: Negative for congestion and neck stiffness.   Eyes: Negative for visual disturbance.  Respiratory: Positive for shortness of breath. Negative for cough,  choking, chest tightness, wheezing and stridor.   Cardiovascular: Positive for chest pain. Negative for leg swelling.  Gastrointestinal: Negative for nausea, vomiting and abdominal pain.  Genitourinary: Negative for dysuria, urgency and frequency.  Musculoskeletal: Negative for gait problem.  Skin: Negative for rash.  Neurological: Positive for dizziness. Negative for syncope, weakness, light-headedness, numbness and headaches.  Psychiatric/Behavioral: Negative for confusion.  All other systems reviewed and are negative.    Allergies  Aspirin; Iodine; Latex; Shellfish-derived products; and Vancomycin  Home Medications   Current Outpatient Rx  Name Route Sig Dispense Refill  . BACLOFEN 20 MG PO TABS Oral Take 20 mg by mouth 3 (three) times daily.    Marland Kitchen DIAZEPAM 5 MG PO TABS Oral Take 5-10 mg by mouth every 8 (eight) hours as needed. Take 1 tablet in AM, 1 tablet in PM, and 2 tablets at bedtime    . FLUTICASONE PROPIONATE 50 MCG/ACT NA SUSP Nasal Place 2 sprays into the nose daily.    Marland Kitchen GABAPENTIN 800 MG PO TABS Oral Take 800 mg by mouth 3 (three) times daily.    . IBUPROFEN 200 MG PO TABS Oral Take 200 mg by mouth every 6 (six) hours as needed. For pain    . LAMOTRIGINE 25 MG PO TABS Oral Take 125 mg by mouth daily.    Marland Kitchen LORATADINE 10 MG PO TABS Oral Take 10 mg by mouth daily.    . LUBIPROSTONE 8  MCG PO CAPS Oral Take 8 mcg by mouth 2 (two) times daily with a meal.    . ADULT MULTIVITAMIN W/MINERALS CH Oral Take 1 tablet by mouth daily.    Marland Kitchen OMEPRAZOLE 40 MG PO CPDR Oral Take 40 mg by mouth daily.    Marland Kitchen RIZATRIPTAN BENZOATE 10 MG PO TBDP Oral Take 10 mg by mouth as needed. For migraines; may repeat in 1 hour if needed.    . SERTRALINE HCL 100 MG PO TABS Oral Take 100 mg by mouth daily.      BP 107/61  Pulse 86  Temp 98.1 F (36.7 C) (Oral)  Resp 16  SpO2 95%  LMP 09/22/2011  Physical Exam  Nursing note and vitals reviewed. Constitutional: She is oriented to person, place, and  time. She appears well-developed and well-nourished. No distress.  HENT:  Head: Normocephalic and atraumatic.       Finger rub audible bilaterally   Eyes: Conjunctivae and EOM are normal.  Neck: Normal range of motion.  Cardiovascular:       Regular rate rhythm, no carotid bruits, no aberrancy and auscultation.  Intact distal pulses.  No pitting edema.  Pulmonary/Chest: Effort normal.       Lungs clear to auscultation bilaterally.  No respiratory distress.  Abdominal:       Soft, nontender, urostomy bag.  Musculoskeletal: Normal range of motion.  Neurological: She is alert and oriented to person, place, and time.  Skin: Skin is warm and dry. No rash noted. She is not diaphoretic.  Psychiatric: She has a normal mood and affect. Her behavior is normal.    ED Course  Procedures (including critical care time)  Labs Reviewed  COMPREHENSIVE METABOLIC PANEL - Abnormal; Notable for the following:    Glucose, Bld 172 (*)     Creatinine, Ser 0.37 (*)     All other components within normal limits  CBC WITH DIFFERENTIAL  CARDIAC PANEL(CRET KIN+CKTOT+MB+TROPI)  MAGNESIUM  PHOSPHORUS   Dg Chest 2 View  10/16/2011  *RADIOLOGY REPORT*  Clinical Data: Chest pain.  CHEST - 2 VIEW  Comparison: 07/25/2011  Findings: Heart size and pulmonary vascularity are normal and the lungs are clear.  No significant osseous abnormality.  IMPRESSION: Normal chest.  Original Report Authenticated By: Gwynn Burly, M.D.     No diagnosis found.   Date: 10/16/2011  Rate: 89  Rhythm: normal sinus rhythm  QRS Axis: indeterminate  Intervals: QT prolonged  ST/T Wave abnormalities: normal  Conduction Disutrbances:none  Narrative Interpretation:   Old EKG Reviewed: changes noted,   12:15 PM- mother refused pain medication for daughter stating it makes her to drowsy and she cant be moved.   Date: 10/16/2011, Repeat ECG   Rate: 72  Rhythm: normal sinus rhythm  QRS Axis: normal  Intervals: normal  ST/T  Wave abnormalities: non specific t wave changes  Conduction Disutrbances: none  Narrative Interpretation:   Old EKG Reviewed: No significant changes noted  Consult Dr. Arlyn Leak: repeat ECG, does believe the pt needs to be admitted for cardiac causes. QT prolongation improved after repeat.   MDM  Prolonged QT CP, Dizziness   Patient is to be discharged with recommendation to follow up with PCP in regards to today's hospital visit. Chest pain is not likely of cardiac or pulmonary etiology d/t presentation, perc negative , neg ddimer, VSS, no tracheal deviation, no JVD or new murmur, RRR, breath sounds equal bilaterally, EKG without acute abnormalities, negative troponin, and negative  Pt appears reliable  for follow up and is agreeable to discharge.   Case has been discussed with and seen by Dr. Manus Gunning who agrees with the above plan to discharge.            Jaci Carrel, [place] 10/16/11 1514

## 2011-10-16 NOTE — ED Notes (Signed)
susternal chest pain sts no hx of same. abd pain, sob, for several days.

## 2011-11-15 ENCOUNTER — Other Ambulatory Visit: Payer: Self-pay | Admitting: Obstetrics and Gynecology

## 2011-11-15 ENCOUNTER — Other Ambulatory Visit (HOSPITAL_COMMUNITY)
Admission: RE | Admit: 2011-11-15 | Discharge: 2011-11-15 | Disposition: A | Discharge status: Home / Self Care | Payer: Medicaid Other | Source: Ambulatory Visit | Attending: Obstetrics and Gynecology | Admitting: Obstetrics and Gynecology

## 2011-11-15 ENCOUNTER — Other Ambulatory Visit (HOSPITAL_COMMUNITY): Payer: Self-pay | Admitting: Obstetrics and Gynecology

## 2011-11-15 DIAGNOSIS — Z01419 Encounter for gynecological examination (general) (routine) without abnormal findings: Secondary | ICD-10-CM | POA: Insufficient documentation

## 2011-11-15 DIAGNOSIS — R58 Hemorrhage, not elsewhere classified: Secondary | ICD-10-CM

## 2011-11-15 DIAGNOSIS — N920 Excessive and frequent menstruation with regular cycle: Secondary | ICD-10-CM

## 2011-11-21 ENCOUNTER — Ambulatory Visit (HOSPITAL_COMMUNITY)
Admission: RE | Admit: 2011-11-21 | Discharge: 2011-11-21 | Disposition: A | Discharge status: Home / Self Care | Payer: Medicaid Other | Source: Ambulatory Visit | Attending: Obstetrics and Gynecology | Admitting: Obstetrics and Gynecology

## 2011-11-21 DIAGNOSIS — R58 Hemorrhage, not elsewhere classified: Secondary | ICD-10-CM

## 2011-11-21 DIAGNOSIS — N920 Excessive and frequent menstruation with regular cycle: Secondary | ICD-10-CM | POA: Insufficient documentation

## 2011-11-21 DIAGNOSIS — N83209 Unspecified ovarian cyst, unspecified side: Secondary | ICD-10-CM | POA: Insufficient documentation

## 2012-01-09 ENCOUNTER — Emergency Department (HOSPITAL_COMMUNITY)
Admission: EM | Admit: 2012-01-09 | Discharge: 2012-01-10 | Disposition: A | Discharge status: Home / Self Care | Payer: Medicaid Other | Attending: Emergency Medicine | Admitting: Emergency Medicine

## 2012-01-09 DIAGNOSIS — Z79899 Other long term (current) drug therapy: Secondary | ICD-10-CM | POA: Insufficient documentation

## 2012-01-09 DIAGNOSIS — R109 Unspecified abdominal pain: Secondary | ICD-10-CM | POA: Insufficient documentation

## 2012-01-09 DIAGNOSIS — G35 Multiple sclerosis: Secondary | ICD-10-CM | POA: Insufficient documentation

## 2012-01-09 DIAGNOSIS — Z9089 Acquired absence of other organs: Secondary | ICD-10-CM | POA: Insufficient documentation

## 2012-01-09 DIAGNOSIS — K5641 Fecal impaction: Secondary | ICD-10-CM | POA: Insufficient documentation

## 2012-01-09 HISTORY — DX: Acute transverse myelitis in demyelinating disease of central nervous system: G37.3

## 2012-01-10 ENCOUNTER — Emergency Department (HOSPITAL_COMMUNITY): Payer: Medicaid Other

## 2012-01-10 ENCOUNTER — Encounter (HOSPITAL_COMMUNITY): Payer: Self-pay | Admitting: Family Medicine

## 2012-01-10 LAB — URINALYSIS, ROUTINE W REFLEX MICROSCOPIC
Glucose, UA: NEGATIVE mg/dL
Hgb urine dipstick: NEGATIVE
Specific Gravity, Urine: 1.018 (ref 1.005–1.030)
Urobilinogen, UA: 1 mg/dL (ref 0.0–1.0)

## 2012-01-10 LAB — COMPREHENSIVE METABOLIC PANEL
ALT: 14 U/L (ref 0–35)
AST: 15 U/L (ref 0–37)
Alkaline Phosphatase: 96 U/L (ref 39–117)
CO2: 25 mEq/L (ref 19–32)
GFR calc Af Amer: >90 mL/min (ref >90)
GFR calc non Af Amer: >90 mL/min (ref >90)
Glucose, Bld: 99 mg/dL (ref 70–99)
Potassium: 3.9 mEq/L (ref 3.5–5.1)
Sodium: 139 mEq/L (ref 135–145)
Total Protein: 7 g/dL (ref 6.0–8.3)

## 2012-01-10 LAB — PREGNANCY, URINE: Preg Test, Ur: NEGATIVE

## 2012-01-10 LAB — CBC WITH DIFFERENTIAL/PLATELET
Basophils Relative: 0 % (ref 0–1)
Eosinophils Relative: 4 % (ref 0–5)
Hemoglobin: 13.6 g/dL (ref 12.0–15.0)
Lymphocytes Relative: 26 % (ref 12–46)
Neutrophils Relative %: 65 % (ref 43–77)
Platelets: 321 10*3/uL (ref 150–400)
RBC: 4.82 MIL/uL (ref 3.87–5.11)
WBC: 9.1 10*3/uL (ref 4.0–10.5)

## 2012-01-10 LAB — URINE MICROSCOPIC-ADD ON

## 2012-01-10 MED ORDER — SODIUM CHLORIDE 0.9 % IV BOLUS (SEPSIS)
1000.0000 mL | Freq: Once | INTRAVENOUS | Status: AC
  Administered 2012-01-10: 1000 mL via INTRAVENOUS

## 2012-01-10 MED ORDER — ONDANSETRON HCL 4 MG/2ML IJ SOLN
4.0000 mg | Freq: Once | INTRAMUSCULAR | Status: AC
  Administered 2012-01-10: 4 mg via INTRAVENOUS
  Filled 2012-01-10: qty 2

## 2012-01-10 MED ORDER — BISACODYL 10 MG RE SUPP: 10.0000 mg | RECTAL | Status: DC | PRN

## 2012-01-10 MED ORDER — MORPHINE SULFATE 2 MG/ML IJ SOLN
2.0000 mg | Freq: Once | INTRAMUSCULAR | Status: AC
  Administered 2012-01-10: 2 mg via INTRAVENOUS
  Filled 2012-01-10: qty 1

## 2012-01-10 NOTE — ED Provider Notes (Signed)
History     CSN: [id number]  Arrival date & time 01/09/12  2345   First MD Initiated Contact with Patient 01/10/12 0207      Chief Complaint  Patient presents with  . Abdominal Pain   HPI  History provided by the patient and mother. Patient is a 31 year old female with history of a form of ALS/transverse myelitis, depression and paraplegia who presents with complaints of lower abdomen and pelvic pains. Patient mother states she generally does not have any sensations from the waist down. Earlier today she began having increasing pain and discomfort she associates low in her pelvis area. Patient cannot describe the pain very well and states that she can just tell that it hurts and is uncomfortable. She did take Advil without any relief earlier in the day. She denies having similar symptoms previously. 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 She also has frequent hard stool and occasional impactions. He denies any change in BMs. She denies any fever, chills, sweats, nausea or vomiting.    Past Medical History  Diagnosis Date  . Multiple sclerosis   . Depression   . PTSD (post-traumatic stress disorder)   . Paraplegia   . Transverse myelitis     Past Surgical History  Procedure Date  . Tonsillectomy   . Cholecystectomy   . Cystectomy w/ ureteroileal conduit   . Wisdom tooth extraction     No family history on file.  History  Substance Use Topics  . Smoking status: Never Smoker   . Smokeless tobacco: Never Used  . Alcohol Use: No    OB History    Grav Para Term Preterm Abortions TAB SAB Ect Mult Living                  Review of Systems  Constitutional: Negative for fever, chills and appetite change.  Respiratory: Negative for cough and shortness of breath.   Cardiovascular: Negative for chest pain.  Gastrointestinal: Positive for abdominal pain. Negative for nausea, vomiting and diarrhea.  Genitourinary: Positive for pelvic pain. Negative for vaginal  bleeding and vaginal discharge.    Allergies  Aspirin; Iodine; Latex; Shellfish-derived products; and Vancomycin  Home Medications   Current Outpatient Rx  Name Route Sig Dispense Refill  . BACLOFEN 20 MG PO TABS Oral Take 20 mg by mouth 3 (three) times daily.    Marland Kitchen DIAZEPAM 5 MG PO TABS Oral Take 5-10 mg by mouth every 8 (eight) hours. Take 1 tablet in AM, 1 tablet in PM, and 2 tablets at bedtime    . FLUTICASONE PROPIONATE 50 MCG/ACT NA SUSP Nasal Place 2 sprays into the nose daily.    Marland Kitchen GABAPENTIN 800 MG PO TABS Oral Take 800 mg by mouth 3 (three) times daily.    Marland Kitchen LAMOTRIGINE 25 MG PO TABS Oral Take 125 mg by mouth daily.    Marland Kitchen LORATADINE 10 MG PO TABS Oral Take 10 mg by mouth daily.    . LUBIPROSTONE 8 MCG PO CAPS Oral Take 8 mcg by mouth 2 (two) times daily with a meal.    . ADULT MULTIVITAMIN W/MINERALS CH Oral Take 1 tablet by mouth daily.    Marland Kitchen OMEPRAZOLE 40 MG PO CPDR Oral Take 40 mg by mouth daily.    Marland Kitchen ONDANSETRON HCL 8 MG PO TABS Oral Take 8 mg by mouth every 8 (eight) hours as needed. For nausea    . RIZATRIPTAN BENZOATE 10 MG PO TBDP Oral Take 10 mg by mouth as  needed. For migraines; may repeat in 1 hour if needed.    . SERTRALINE HCL 100 MG PO TABS Oral Take 100 mg by mouth daily.    . IBUPROFEN 200 MG PO TABS Oral Take 200 mg by mouth every 6 (six) hours as needed. For pain      BP 96/55  Pulse 80  Temp 97.9 F (36.6 C) (Oral)  Resp 18  Wt 135 lb (61.236 kg)  SpO2 94%  LMP 12/27/2011  Physical Exam  Nursing note and vitals reviewed. Constitutional: She is oriented to person, place, and time. She appears well-developed and well-nourished. No distress.  HENT:  Head: Normocephalic.  Cardiovascular: Normal rate and regular rhythm.   No murmur heard. Pulmonary/Chest: Effort normal and breath sounds normal. No respiratory distress. She has no rales.  Abdominal: Soft. She exhibits no distension and no mass. There is tenderness. There is no rebound and no guarding.        Obese. Ileal conduit with ostomy clean dry and intact. Diffuse inferior tenderness  Genitourinary:       Chaperone was present. Large fecal impaction. No gross blood. No tenderness.  Musculoskeletal:       Chronic disfigurement of the lateral fingers and hands.  Neurological: She is alert and oriented to person, place, and time.       Paraplegia at baseline.  Skin: Skin is warm and dry. No rash noted.  Psychiatric: She has a normal mood and affect. Her behavior is normal.    ED Course  Procedures   Results for orders placed during the hospital encounter of 01/09/12  CBC WITH DIFFERENTIAL      Component Value Range   WBC 9.1  4.0 - 10.5 K/uL   RBC 4.82  3.87 - 5.11 MIL/uL   Hemoglobin 13.6  12.0 - 15.0 g/dL   HCT 29.5  62.1 - 30.8 %   MCV 84.2  78.0 - 100.0 fL   MCH 28.2  26.0 - 34.0 pg   MCHC 33.5  30.0 - 36.0 g/dL   RDW 65.7  84.6 - 96.2 %   Platelets 321  150 - 400 K/uL   Neutrophils Relative 65  43 - 77 %   Lymphocytes Relative 26  12 - 46 %   Monocytes Relative 5  3 - 12 %   Eosinophils Relative 4  0 - 5 %   Basophils Relative 0  0 - 1 %   Neutro Abs 5.8  1.7 - 7.7 K/uL   Lymphs Abs 2.4  0.7 - 4.0 K/uL   Monocytes Absolute 0.5  0.1 - 1.0 K/uL   Eosinophils Absolute 0.4  0.0 - 0.7 K/uL   Basophils Absolute 0.0  0.0 - 0.1 K/uL   Smear Review LARGE PLATELETS PRESENT    COMPREHENSIVE METABOLIC PANEL      Component Value Range   Sodium 139  135 - 145 mEq/L   Potassium 3.9  3.5 - 5.1 mEq/L   Chloride 103  96 - 112 mEq/L   CO2 25  19 - 32 mEq/L   Glucose, Bld 99  70 - 99 mg/dL   BUN 12  6 - 23 mg/dL   Creatinine, Ser 9.52 (*) 0.50 - 1.10 mg/dL   Calcium 9.8  8.4 - 84.1 mg/dL   Total Protein 7.0  6.0 - 8.3 g/dL   Albumin 3.9  3.5 - 5.2 g/dL   AST 15  0 - 37 U/L   ALT 14  0 - 35 U/L  Alkaline Phosphatase 96  39 - 117 U/L   Total Bilirubin 0.2 (*) 0.3 - 1.2 mg/dL   GFR calc non Af Amer >90  >90 mL/min   GFR calc Af Amer >90  >90 mL/min  LIPASE, BLOOD       Component Value Range   Lipase 37  11 - 59 U/L  URINALYSIS, ROUTINE W REFLEX MICROSCOPIC      Component Value Range   Color, Urine AMBER (*) YELLOW   APPearance TURBID (*) CLEAR   Specific Gravity, Urine 1.018  1.005 - 1.030   pH 8.5 (*) 5.0 - 8.0   Glucose, UA NEGATIVE  NEGATIVE mg/dL   Hgb urine dipstick NEGATIVE  NEGATIVE   Bilirubin Urine SMALL (*) NEGATIVE   Ketones, ur NEGATIVE  NEGATIVE mg/dL   Protein, ur 30 (*) NEGATIVE mg/dL   Urobilinogen, UA 1.0  0.0 - 1.0 mg/dL   Nitrite NEGATIVE  NEGATIVE   Leukocytes, UA SMALL (*) NEGATIVE  PREGNANCY, URINE      Component Value Range   Preg Test, Ur NEGATIVE  NEGATIVE  URINE MICROSCOPIC-ADD ON      Component Value Range   WBC, UA TOO NUMEROUS TO COUNT  <3 WBC/hpf   Bacteria, UA MANY (*) RARE   Crystals URIC ACID CRYSTALS (*) NEGATIVE        Ct Abdomen Pelvis Wo Contrast  01/10/2012  *RADIOLOGY REPORT*  Clinical Data: 31 year old female with lower abdominal pain. Paraplegia.  Previous cholecystectomy and bladder resection with ileal conduit.  IV contrast allergy.  CT ABDOMEN AND PELVIS WITHOUT CONTRAST  Technique:  Multidetector CT imaging of the abdomen and pelvis was performed following the standard protocol without intravenous contrast.  Comparison: [facility] CT abdomen and pelvis 02/02/2009.  Findings: Left greater than right lower lobe dependent atelectasis. Mild cardiomegaly.  No pericardial or pleural effusion.  Scoliosis. No acute osseous abnormality identified.  Soft tissue thickening over the sacrum.  Large stool ball in the rectum.  No pelvic free fluid.  Uterus and adnexa within normal limits.  Residual soft tissue in the region of the bladder resembles a collapsed bladder with trace hyperdense contents.  Scarring in the space of Retzius extending to the lower ventral abdominal wall.  This appearance is stable.  Sigmoid colon intermittently decompressed and gas filled.  Stable appearance of the descending and more  proximal colon segments with some retained stool.  Stable normal appendix.  Oral contrast and proximal small bowel loops, the stomach and duodenum. Stable mid abdominal small bowel anastomosis.  Oral contrast has not yet reached this level.  No dilated small bowel.  Gallbladder surgically absent.  Negative noncontrast liver, spleen, pancreas and adrenal glands.  Ileal conduit in the mid abdomen is decompressed and unremarkable. Ileostomy site appears stable and within normal limits. No hydronephrosis or hydroureter.  No nephrolithiasis or ureteral calculus.  No abdominal free fluid.  No focal inflammatory stranding.  IMPRESSION: 1.  Large stool wall the rectum may reflect fecal impaction.  No dilated bowel.  Stable, normal appendix. 2.  Stable appearance status post ileal conduit and cystectomy with no adverse features.   Original Report Authenticated By: Ulla Potash III, M.D.      1. Abdominal pain   2. Fecal impaction       MDM  2:00 AM patient seen and evaluated. Patient appears in mild discomfort. No acute distress.        Angus Seller, [place] 01/10/12 [phone]

## 2012-01-10 NOTE — ED Notes (Signed)
Bed:WA22<BR> Expected date:<BR> Expected time:<BR> Means of arrival:<BR> Comments:<BR>

## 2012-01-10 NOTE — ED Notes (Signed)
Patient states that she started having abdominal pain earlier today. Took an Advil without relief. Also c/o vaginal discomfort. Denies discharge. Patient states that she hasn't been able to feel below her waist for 8 years.

## 2012-01-10 NOTE — ED Notes (Signed)
Patient transported to CT 

## 2012-01-11 NOTE — ED Provider Notes (Signed)
Medical screening examination/treatment/procedure(s) were performed by non-physician practitioner and as supervising physician I was immediately available for consultation/collaboration.  Rosemond Lyttle, MD 01/11/12 1120 

## 2012-02-07 ENCOUNTER — Encounter: Payer: Self-pay | Admitting: Physical Medicine & Rehabilitation

## 2012-02-15 ENCOUNTER — Encounter (HOSPITAL_COMMUNITY): Payer: Self-pay | Admitting: Family Medicine

## 2012-02-15 ENCOUNTER — Emergency Department (HOSPITAL_COMMUNITY): Payer: Medicaid Other

## 2012-02-15 ENCOUNTER — Inpatient Hospital Stay (HOSPITAL_COMMUNITY)
Admission: EM | Admit: 2012-02-15 | Discharge: 2012-02-18 | DRG: 871 | Disposition: A | Discharge status: Home / Self Care | Payer: Medicaid Other | Attending: Family Medicine | Admitting: Family Medicine

## 2012-02-15 DIAGNOSIS — A419 Sepsis, unspecified organism: Principal | ICD-10-CM | POA: Diagnosis present

## 2012-02-15 DIAGNOSIS — G35 Multiple sclerosis: Secondary | ICD-10-CM | POA: Diagnosis present

## 2012-02-15 DIAGNOSIS — E876 Hypokalemia: Secondary | ICD-10-CM

## 2012-02-15 DIAGNOSIS — J189 Pneumonia, unspecified organism: Secondary | ICD-10-CM | POA: Diagnosis present

## 2012-02-15 DIAGNOSIS — G8220 Paraplegia, unspecified: Secondary | ICD-10-CM | POA: Diagnosis present

## 2012-02-15 DIAGNOSIS — Z872 Personal history of diseases of the skin and subcutaneous tissue: Secondary | ICD-10-CM

## 2012-02-15 HISTORY — DX: Calculus in bladder: N21.0

## 2012-02-15 HISTORY — DX: Gastro-esophageal reflux disease without esophagitis: K21.9

## 2012-02-15 MED ORDER — ACETAMINOPHEN 325 MG PO TABS
650.0000 mg | ORAL_TABLET | Freq: Once | ORAL | Status: AC
  Administered 2012-02-15: 650 mg via ORAL
  Filled 2012-02-15: qty 2

## 2012-02-15 MED ORDER — DEXTROSE 5 % IV SOLN
1.0000 g | INTRAVENOUS | Status: DC
  Administered 2012-02-16: 1 g via INTRAVENOUS
  Filled 2012-02-15 (×2): qty 10

## 2012-02-15 MED ORDER — SODIUM CHLORIDE 0.9 % IV SOLN
Freq: Once | INTRAVENOUS | Status: AC
  Administered 2012-02-16: via INTRAVENOUS

## 2012-02-15 NOTE — ED Provider Notes (Addendum)
History     CSN: [id number]  Arrival date & time 02/15/12  4098   First MD Initiated Contact with Patient 02/15/12 2100      Chief Complaint  Patient presents with  . Fever    (Consider location/radiation/quality/duration/timing/severity/associated sxs/prior treatment) Patient is a 31 y.o. female presenting with cough. The history is provided by the patient. No language interpreter was used.  Cough This is a new problem. The current episode started more than 2 days ago. The problem occurs constantly. The problem has not changed since onset.The cough is productive of sputum. The maximum temperature recorded prior to her arrival was 102 to 102.9 F. The fever has been present for 3 to 4 days. She has tried nothing for the symptoms. The treatment provided moderate relief. She is not a smoker.   Pt was given rx for zithromax.   Pt did not take because she is allergic to vancomycin Past Medical History  Diagnosis Date  . Multiple sclerosis   . Depression   . PTSD (post-traumatic stress disorder)   . Paraplegia   . Transverse myelitis     Past Surgical History  Procedure Date  . Tonsillectomy   . Cholecystectomy   . Cystectomy w/ ureteroileal conduit   . Wisdom tooth extraction     No family history on file.  History  Substance Use Topics  . Smoking status: Never Smoker   . Smokeless tobacco: Never Used  . Alcohol Use: No    OB History    Grav Para Term Preterm Abortions TAB SAB Ect Mult Living                  Review of Systems  Constitutional: Positive for fever.  Respiratory: Positive for cough.   Gastrointestinal: Positive for nausea, vomiting and diarrhea.  All other systems reviewed and are negative.    Allergies  Other; Aspirin; Iodine; Latex; Shellfish-derived products; and Vancomycin  Home Medications   Current Outpatient Rx  Name  Route  Sig  Dispense  Refill  . BACLOFEN 20 MG PO TABS   Oral   Take 20 mg by mouth 3 (three) times daily.           Marland Kitchen BISACODYL 10 MG RE SUPP   Rectal   Place 1 suppository (10 mg total) rectally as needed for constipation.   12 suppository   0   . DIAZEPAM 5 MG PO TABS   Oral   Take 5-10 mg by mouth every 8 (eight) hours. Take 1 tablet in AM, 1 tablet in PM, and 2 tablets at bedtime         . FLUTICASONE PROPIONATE 50 MCG/ACT NA SUSP   Nasal   Place 2 sprays into the nose daily.         Marland Kitchen GABAPENTIN 800 MG PO TABS   Oral   Take 800 mg by mouth 3 (three) times daily.         . IBUPROFEN 200 MG PO TABS   Oral   Take 400 mg by mouth every 6 (six) hours as needed. For pain         . LAMOTRIGINE 25 MG PO TABS   Oral   Take 125 mg by mouth daily.         Marland Kitchen LORATADINE 10 MG PO TABS   Oral   Take 10 mg by mouth daily.         . LUBIPROSTONE 8 MCG PO CAPS   Oral  Take 8 mcg by mouth 2 (two) times daily with a meal.         . ADULT MULTIVITAMIN W/MINERALS CH   Oral   Take 1 tablet by mouth daily.         Marland Kitchen OMEPRAZOLE 40 MG PO CPDR   Oral   Take 40 mg by mouth daily.         Marland Kitchen ONDANSETRON HCL 8 MG PO TABS   Oral   Take 8 mg by mouth every 8 (eight) hours as needed. For nausea         . NYQUIL MULTI-SYMPTOM PO   Oral   Take 10 mLs by mouth as needed. Cold         . DAYQUIL MULTI-SYMPTOM PO   Oral   Take 10 mLs by mouth as needed. Cold         . RIZATRIPTAN BENZOATE 10 MG PO TBDP   Oral   Take 10 mg by mouth as needed. For migraines; may repeat in 1 hour if needed.         . SERTRALINE HCL 100 MG PO TABS   Oral   Take 100 mg by mouth daily.           BP 114/67  Pulse 112  Temp 100.4 F (38 C) (Oral)  Resp 16  SpO2 93%  LMP 01/03/2012  Physical Exam  Nursing note and vitals reviewed. Constitutional: She is oriented to person, place, and time. She appears well-developed and well-nourished.  HENT:  Head: Normocephalic and atraumatic.  Right Ear: External ear normal.  Left Ear: External ear normal.  Nose: Nose normal.       Mouth dry   Eyes: Conjunctivae normal and EOM are normal. Pupils are equal, round, and reactive to light.  Neck: Normal range of motion.  Cardiovascular: Normal rate and regular rhythm.   Pulmonary/Chest: Effort normal.  Abdominal: Soft.  Musculoskeletal: Normal range of motion.  Neurological: She is alert and oriented to person, place, and time.  Skin: Skin is warm.  Psychiatric: She has a normal mood and affect.    ED Course  Procedures (including critical care time)  Labs Reviewed - No data to display Dg Chest 2 View  02/15/2012  *RADIOLOGY REPORT*  Clinical Data: Cough with flu-like symptoms and congestion.  CHEST - 2 VIEW  Comparison: 10/16/2011  Findings: Abnormal density in the left upper lobe and lingula not present previously consistent with pneumonia.  No effusion or pneumothorax.  Right lung clear.  Normal cardiac size.  Negative osseous structures.  IMPRESSION: Acute left upper lobe and lingular pneumonia.   Original Report Authenticated By: Davonna Belling, M.D.      No diagnosis found.    MDM    I spoke to pt and Mother.   Mother reports they recently lost their home health nurse.   Mother does not feel safe caring for pt at home with pneumonia.   I spoke to [facility] hospitalist who will see here      Elson Areas, [place] 02/15/12 2334  Lonia Skinner [place], [place] 02/16/12 0120

## 2012-02-15 NOTE — ED Notes (Signed)
[id number]<AV> Expected date:<BR> Expected time:<BR> Means of arrival:<BR> Comments:<BR> [name]

## 2012-02-15 NOTE — ED Notes (Signed)
Patient states that she has been running a fever for the past 3 days. Was seen by PCP and was diagnosed with a virus. Has been taking Advil and Dayquil without relief of symptoms. Fever was 102 at home. Reports having cough and sore throat. PCP called in Azithromax but patient has an allergy to -mycins.

## 2012-02-15 NOTE — ED Notes (Signed)
Iv team notified.  Iv attmpted and usuccesful x 1

## 2012-02-15 NOTE — ED Notes (Signed)
[id number]<AO> Expected date:<BR> Expected time:<BR> Means of arrival:<BR> Comments:<BR> [name]

## 2012-02-15 NOTE — ED Notes (Signed)
Pt paraplegic, with mom.  Pt present with c/o fever x3 days. Pt went to see pcp and was dc'd with prescription in which she is allergic to.  Pt began feeling worse and mom brought her into ED for eval.  Pt warm to touch.

## 2012-02-16 DIAGNOSIS — E876 Hypokalemia: Secondary | ICD-10-CM

## 2012-02-16 DIAGNOSIS — J189 Pneumonia, unspecified organism: Secondary | ICD-10-CM | POA: Diagnosis present

## 2012-02-16 DIAGNOSIS — Z872 Personal history of diseases of the skin and subcutaneous tissue: Secondary | ICD-10-CM

## 2012-02-16 DIAGNOSIS — A419 Sepsis, unspecified organism: Secondary | ICD-10-CM | POA: Diagnosis present

## 2012-02-16 LAB — CBC WITH DIFFERENTIAL/PLATELET
Basophils Absolute: 0 10*3/uL (ref 0.0–0.1)
Eosinophils Relative: 1 % (ref 0–5)
Lymphocytes Relative: 13 % (ref 12–46)
Lymphs Abs: 1.8 10*3/uL (ref 0.7–4.0)
MCV: 82.6 fL (ref 78.0–100.0)
Neutro Abs: 11.2 10*3/uL — ABNORMAL HIGH (ref 1.7–7.7)
Neutrophils Relative %: 83 % — ABNORMAL HIGH (ref 43–77)
Platelets: 273 10*3/uL (ref 150–400)
RBC: 4.76 MIL/uL (ref 3.87–5.11)
RDW: 14 % (ref 11.5–15.5)
WBC: 13.5 10*3/uL — ABNORMAL HIGH (ref 4.0–10.5)

## 2012-02-16 LAB — BASIC METABOLIC PANEL
CO2: 25 mEq/L (ref 19–32)
CO2: 27 mEq/L (ref 19–32)
Calcium: 8.6 mg/dL (ref 8.4–10.5)
Calcium: 9.2 mg/dL (ref 8.4–10.5)
Chloride: 96 mEq/L (ref 96–112)
GFR calc non Af Amer: >90 mL/min (ref >90)
Glucose, Bld: 99 mg/dL (ref 70–99)
Potassium: 3 mEq/L — ABNORMAL LOW (ref 3.5–5.1)
Potassium: 3.4 mEq/L — ABNORMAL LOW (ref 3.5–5.1)
Sodium: 135 mEq/L (ref 135–145)
Sodium: 138 mEq/L (ref 135–145)

## 2012-02-16 LAB — STREP PNEUMONIAE URINARY ANTIGEN: Strep Pneumo Urinary Antigen: NEGATIVE

## 2012-02-16 LAB — INFLUENZA PANEL BY PCR (TYPE A & B)
Influenza A By PCR: NEGATIVE
Influenza B By PCR: NEGATIVE

## 2012-02-16 LAB — CBC
MCH: 28 pg (ref 26.0–34.0)
Platelets: 210 10*3/uL (ref 150–400)
RBC: 4.18 MIL/uL (ref 3.87–5.11)

## 2012-02-16 LAB — HIV ANTIBODY (ROUTINE TESTING W REFLEX): HIV: NONREACTIVE

## 2012-02-16 MED ORDER — SODIUM CHLORIDE 0.9 % IV SOLN
INTRAVENOUS | Status: DC
  Administered 2012-02-16 – 2012-02-17 (×2): via INTRAVENOUS

## 2012-02-16 MED ORDER — SUMATRIPTAN SUCCINATE 100 MG PO TABS
100.0000 mg | ORAL_TABLET | ORAL | Status: DC | PRN
  Filled 2012-02-16: qty 1

## 2012-02-16 MED ORDER — BACLOFEN 20 MG PO TABS
20.0000 mg | ORAL_TABLET | Freq: Three times a day (TID) | ORAL | Status: DC
  Administered 2012-02-16 – 2012-02-18 (×7): 20 mg via ORAL
  Filled 2012-02-16 (×9): qty 1

## 2012-02-16 MED ORDER — POTASSIUM CHLORIDE CRYS ER 20 MEQ PO TBCR
40.0000 meq | EXTENDED_RELEASE_TABLET | Freq: Two times a day (BID) | ORAL | Status: AC
  Administered 2012-02-16 (×2): 40 meq via ORAL
  Filled 2012-02-16 (×2): qty 2

## 2012-02-16 MED ORDER — LUBIPROSTONE 8 MCG PO CAPS
8.0000 ug | ORAL_CAPSULE | Freq: Two times a day (BID) | ORAL | Status: DC
  Administered 2012-02-16 – 2012-02-18 (×5): 8 ug via ORAL
  Filled 2012-02-16 (×7): qty 1

## 2012-02-16 MED ORDER — LORATADINE 10 MG PO TABS
10.0000 mg | ORAL_TABLET | Freq: Every day | ORAL | Status: DC
Start: 2012-02-16 — End: 2012-02-18
  Administered 2012-02-16 – 2012-02-18 (×3): 10 mg via ORAL
  Filled 2012-02-16 (×3): qty 1

## 2012-02-16 MED ORDER — PANTOPRAZOLE SODIUM 40 MG PO TBEC
80.0000 mg | DELAYED_RELEASE_TABLET | Freq: Every day | ORAL | Status: DC
  Administered 2012-02-16 – 2012-02-18 (×3): 80 mg via ORAL
  Filled 2012-02-16 (×3): qty 2

## 2012-02-16 MED ORDER — ALBUTEROL SULFATE (5 MG/ML) 0.5% IN NEBU
2.5000 mg | INHALATION_SOLUTION | RESPIRATORY_TRACT | Status: DC | PRN
  Administered 2012-02-16: 2.5 mg via RESPIRATORY_TRACT
  Filled 2012-02-16: qty 0.5

## 2012-02-16 MED ORDER — ONDANSETRON HCL 4 MG PO TABS
8.0000 mg | ORAL_TABLET | Freq: Three times a day (TID) | ORAL | Status: DC | PRN
  Administered 2012-02-16: 8 mg via ORAL
  Filled 2012-02-16: qty 2

## 2012-02-16 MED ORDER — FLUTICASONE PROPIONATE 50 MCG/ACT NA SUSP
2.0000 | Freq: Every day | NASAL | Status: DC
  Administered 2012-02-16 – 2012-02-18 (×3): 2 via NASAL
  Filled 2012-02-16: qty 16

## 2012-02-16 MED ORDER — PROMETHAZINE HCL 25 MG/ML IJ SOLN
12.5000 mg | Freq: Four times a day (QID) | INTRAMUSCULAR | Status: DC | PRN
  Administered 2012-02-16 – 2012-02-18 (×3): 12.5 mg via INTRAVENOUS
  Filled 2012-02-16 (×3): qty 1

## 2012-02-16 MED ORDER — GABAPENTIN 400 MG PO CAPS
800.0000 mg | ORAL_CAPSULE | Freq: Three times a day (TID) | ORAL | Status: DC
  Administered 2012-02-16 – 2012-02-18 (×7): 800 mg via ORAL
  Filled 2012-02-16 (×9): qty 2

## 2012-02-16 MED ORDER — SODIUM CHLORIDE 0.9 % IV SOLN: Freq: Once | INTRAVENOUS | Status: DC

## 2012-02-16 MED ORDER — SERTRALINE HCL 100 MG PO TABS
100.0000 mg | ORAL_TABLET | Freq: Every day | ORAL | Status: DC
  Administered 2012-02-16 – 2012-02-18 (×3): 100 mg via ORAL
  Filled 2012-02-16 (×3): qty 1

## 2012-02-16 MED ORDER — BISACODYL 10 MG RE SUPP: 10.0000 mg | RECTAL | Status: DC | PRN

## 2012-02-16 MED ORDER — IPRATROPIUM BROMIDE 0.02 % IN SOLN
0.5000 mg | Freq: Three times a day (TID) | RESPIRATORY_TRACT | Status: DC
  Administered 2012-02-16 – 2012-02-18 (×7): 0.5 mg via RESPIRATORY_TRACT
  Filled 2012-02-16 (×8): qty 2.5

## 2012-02-16 MED ORDER — HEPARIN SODIUM (PORCINE) 5000 UNIT/ML IJ SOLN
5000.0000 [IU] | Freq: Three times a day (TID) | INTRAMUSCULAR | Status: DC
  Administered 2012-02-16 – 2012-02-18 (×8): 5000 [IU] via SUBCUTANEOUS
  Filled 2012-02-16 (×10): qty 1

## 2012-02-16 MED ORDER — LAMOTRIGINE 25 MG PO TABS
125.0000 mg | ORAL_TABLET | Freq: Every day | ORAL | Status: DC
  Administered 2012-02-16 – 2012-02-18 (×3): 125 mg via ORAL
  Filled 2012-02-16 (×3): qty 1

## 2012-02-16 MED ORDER — DIAZEPAM 5 MG PO TABS
5.0000 mg | ORAL_TABLET | Freq: Three times a day (TID) | ORAL | Status: DC
  Administered 2012-02-16 – 2012-02-17 (×5): 5 mg via ORAL
  Administered 2012-02-17 – 2012-02-18 (×2): 10 mg via ORAL
  Administered 2012-02-18: 5 mg via ORAL
  Filled 2012-02-16 (×6): qty 1
  Filled 2012-02-16 (×2): qty 2

## 2012-02-16 MED ORDER — LEVOFLOXACIN IN D5W 750 MG/150ML IV SOLN
750.0000 mg | INTRAVENOUS | Status: DC
  Administered 2012-02-16 – 2012-02-17 (×3): 750 mg via INTRAVENOUS
  Filled 2012-02-16 (×4): qty 150

## 2012-02-16 MED ORDER — ALBUTEROL SULFATE (5 MG/ML) 0.5% IN NEBU
2.5000 mg | INHALATION_SOLUTION | Freq: Three times a day (TID) | RESPIRATORY_TRACT | Status: DC
  Administered 2012-02-16 – 2012-02-18 (×7): 2.5 mg via RESPIRATORY_TRACT
  Filled 2012-02-16 (×8): qty 0.5

## 2012-02-16 MED ORDER — IBUPROFEN 800 MG PO TABS
400.0000 mg | ORAL_TABLET | Freq: Four times a day (QID) | ORAL | Status: DC | PRN
  Administered 2012-02-17 – 2012-02-18 (×2): 400 mg via ORAL
  Filled 2012-02-16: qty 2
  Filled 2012-02-16 (×2): qty 1

## 2012-02-16 MED ORDER — ONDANSETRON HCL 4 MG/2ML IJ SOLN
4.0000 mg | Freq: Four times a day (QID) | INTRAMUSCULAR | Status: DC | PRN
  Administered 2012-02-17 (×2): 4 mg via INTRAVENOUS
  Filled 2012-02-16 (×2): qty 2

## 2012-02-16 NOTE — ED Provider Notes (Signed)
Medical screening examination/treatment/procedure(s) were performed by non-physician practitioner and as supervising physician I was immediately available for consultation/collaboration.   Rolan Bucco, MD 02/16/12 1504

## 2012-02-16 NOTE — Progress Notes (Signed)
ANTIBIOTIC CONSULT NOTE - INITIAL  Pharmacy Consult for  Pharmacy asked to monitor renal function and adjust antibiotics as needed.  Indication: pneumonia  Allergies  Allergen Reactions  . Other Anaphylaxis    ALL -MYCINS  . Aspirin Other (See Comments)    Reaction unknown  . Iodine Other (See Comments)    Reaction unknown  . Latex Other (See Comments)    Reaction unknown  . Shellfish-Derived Products Other (See Comments)    Reaction unknown  . Vancomycin Other (See Comments)    Reaction unknown    Patient Measurements: Height: 5\' 3"  (160 cm) Weight: 143 lb 4.8 oz (65 kg) IBW/kg (Calculated) : 52.4  Adjusted Body Weight:   Vital Signs: Temp: 98.1 F (36.7 C) (11/16 0352) Temp src: Axillary (11/15 2239) BP: 104/71 mmHg (11/16 0352) Pulse Rate: 89  (11/16 0352) Intake/Output from previous day:   Intake/Output from this shift:    Labs:  [facility] 02/15/12 2355  WBC 13.5*  HGB 13.5  PLT 273  LABCREA --  CREATININE 0.42*   Estimated Creatinine Clearance: 92.3 ml/min (by C-G formula based on Cr of 0.42). No results found for this basename: VANCOTROUGH:2,VANCOPEAK:2,VANCORANDOM:2,GENTTROUGH:2,GENTPEAK:2,GENTRANDOM:2,TOBRATROUGH:2,TOBRAPEAK:2,TOBRARND:2,AMIKACINPEAK:2,AMIKACINTROU:2,AMIKACIN:2, in the last 72 hours   Microbiology: No results found for this or any previous visit (from the past 720 hour(s)).  Medical History: Past Medical History  Diagnosis Date  . Multiple sclerosis   . Depression   . PTSD (post-traumatic stress disorder)   . Paraplegia   . Transverse myelitis     Medications:  Anti-infectives     Start     Dose/Rate Route Frequency Ordered Stop   02/16/12 0145   levofloxacin (LEVAQUIN) IVPB 750 mg        750 mg 100 mL/hr over 90 Minutes Intravenous Every 24 hours 02/16/12 0146 02/20/12 2159   02/15/12 2245   cefTRIAXone (ROCEPHIN) 1 g in dextrose 5 % 50 mL IVPB        1 g 100 mL/hr over 30 Minutes Intravenous Every 24 hours 02/15/12  2238           Assessment: Patient with PNA and good renal function.  No need to adjust antibiotics   Goal of Therapy:   Pharmacy asked to monitor renal function and adjust antibiotics as needed.   Plan:  Follow up culture results Continue to monitor renal function  [name] 02/16/2012,4:46 AM

## 2012-02-16 NOTE — H&P (Signed)
[facility] History and Physical  [name] ZOX:[id number] DOB: 1980/10/07 DOA: 02/15/2012  Referring physician: ED PCP: Thora Lance, MD  Specialists: None  Chief Complaint: SOB, cough  HPI: [name] is a 31 y.o. female who presents with cough, fever, chills, and SOB.  Symptoms onset 3-4 days ago, unchanged since onset, cough is productive os sputum, occurs constantly at all times of day, severity of max temperature documented PTA in ED was 102.76F, tried nothing for symptoms.  As an outpatient her PCP gave her a script for azithromycin which she didn't take because her mother notes she is "allergic to all mycins not just vancomycin" apparently in addition to having a known history of anaphalaxis to vancomycin during an inpatient stay in the past, she also has a history of an episode of breaking out in hives when she was given a topical medication ending in -mycin for rosacea in the past for her face.  Outpatient testing for influenza and strep throat were reportedly performed and negative.  In the ED CXR demonstrated LUL infiltrate, patient also had WBC 13.5k, fever of 100.4 and pulse of 112, a diagnosis of CAP with sepsis was made and hospitalist asked to admit the patient.  Review of Systems: 12 systems reviewed and otherwise negative.  Past Medical History  Diagnosis Date  . Multiple sclerosis   . Depression   . PTSD (post-traumatic stress disorder)   . Paraplegia   . Transverse myelitis    Past Surgical History  Procedure Date  . Tonsillectomy   . Cholecystectomy   . Cystectomy w/ ureteroileal conduit   . Wisdom tooth extraction    Social History:  reports that she has never smoked. She has never used smokeless tobacco. She reports that she does not drink alcohol or use illicit drugs. Patient lives at home, is paraplegic at baseline.  Allergies  Allergen Reactions  . Other Anaphylaxis    ALL -MYCINS  . Aspirin Other (See Comments)    Reaction  unknown  . Iodine Other (See Comments)    Reaction unknown  . Latex Other (See Comments)    Reaction unknown  . Shellfish-Derived Products Other (See Comments)    Reaction unknown  . Vancomycin Other (See Comments)    Reaction unknown    No family history on file. No one else in family has been sick with PNA recently though her father just got over a bacterial infection on his face.  Prior to Admission medications   Medication Sig Start Date End Date Taking? Authorizing Provider  baclofen (LIORESAL) 20 MG tablet Take 20 mg by mouth 3 (three) times daily.   Yes Historical Provider, MD  bisacodyl (DULCOLAX) 10 MG suppository Place 1 suppository (10 mg total) rectally as needed for constipation. 01/10/12  Yes Phill Mutter Dammen, PA  diazepam (VALIUM) 5 MG tablet Take 5-10 mg by mouth every 8 (eight) hours. Take 1 tablet in AM, 1 tablet in PM, and 2 tablets at bedtime   Yes Historical Provider, MD  fluticasone (FLONASE) 50 MCG/ACT nasal spray Place 2 sprays into the nose daily.   Yes Historical Provider, MD  gabapentin (NEURONTIN) 800 MG tablet Take 800 mg by mouth 3 (three) times daily.   Yes Historical Provider, MD  ibuprofen (ADVIL,MOTRIN) 200 MG tablet Take 400 mg by mouth every 6 (six) hours as needed. For pain   Yes Historical Provider, MD  lamoTRIgine (LAMICTAL) 25 MG tablet Take 125 mg by mouth daily.   Yes Historical Provider, MD  loratadine (CLARITIN) 10 MG tablet Take 10 mg by mouth daily.   Yes Historical Provider, MD  lubiprostone (AMITIZA) 8 MCG capsule Take 8 mcg by mouth 2 (two) times daily with a meal.   Yes Historical Provider, MD  Multiple Vitamin (MULITIVITAMIN WITH MINERALS) TABS Take 1 tablet by mouth daily.   Yes Historical Provider, MD  omeprazole (PRILOSEC) 40 MG capsule Take 40 mg by mouth daily.   Yes Historical Provider, MD  ondansetron (ZOFRAN) 8 MG tablet Take 8 mg by mouth every 8 (eight) hours as needed. For nausea   Yes Historical Provider, MD    Pseudoeph-Doxylamine-DM-APAP (NYQUIL MULTI-SYMPTOM PO) Take 10 mLs by mouth as needed. Cold   Yes Historical Provider, MD  Pseudoephedrine-APAP-DM (DAYQUIL MULTI-SYMPTOM PO) Take 10 mLs by mouth as needed. Cold   Yes Historical Provider, MD  rizatriptan (MAXALT-MLT) 10 MG disintegrating tablet Take 10 mg by mouth as needed. For migraines; may repeat in 1 hour if needed.   Yes Historical Provider, MD  sertraline (ZOLOFT) 100 MG tablet Take 100 mg by mouth daily.   Yes Historical Provider, MD   Physical Exam: Filed Vitals:   02/15/12 1928 02/15/12 2239  BP: 114/67 117/65  Pulse: 112 108  Temp: 100.4 F (38 C) 98.6 F (37 C)  TempSrc: Oral Axillary  Resp: 16 20  SpO2: 93% 93%    General:  NAD, resting comfortably in bed, somewhat ill appearing Eyes: PEERLA EOMI ENT: mucous membranes moist Neck: supple w/o JVD Cardiovascular: RRR w/o MRG Respiratory: coarse breath sounds in LUL fields, very wet sounding cough Abdomen: soft, nt, nd, bs+ Skin: no rash nor lesion Musculoskeletal: moves upper extremities, patient paraplegic in BLE Psychiatric: normal tone and affect Neurologic: AAOx3, patient is paraplegic which is baseline  Labs on Admission:  Basic Metabolic Panel:  Lab 02/15/12 1610  NA 135  K 3.4*  CL 96  CO2 27  GLUCOSE 99  BUN 12  CREATININE 0.42*  CALCIUM 9.2  MG --  PHOS --   Liver Function Tests: No results found for this basename: AST:5,ALT:5,ALKPHOS:5,BILITOT:5,PROT:5,ALBUMIN:5 in the last 168 hours No results found for this basename: LIPASE:5,AMYLASE:5 in the last 168 hours No results found for this basename: AMMONIA:5 in the last 168 hours CBC:  Lab 02/15/12 2355  WBC 13.5*  NEUTROABS 11.2*  HGB 13.5  HCT 39.3  MCV 82.6  PLT 273   Cardiac Enzymes: No results found for this basename: CKTOTAL:5,CKMB:5,CKMBINDEX:5,TROPONINI:5 in the last 168 hours  BNP (last 3 results) No results found for this basename: PROBNP:3 in the last 8760 hours CBG: No  results found for this basename: GLUCAP:5 in the last 168 hours  Radiological Exams on Admission: Dg Chest 2 View  02/15/2012  *RADIOLOGY REPORT*  Clinical Data: Cough with flu-like symptoms and congestion.  CHEST - 2 VIEW  Comparison: 10/16/2011  Findings: Abnormal density in the left upper lobe and lingula not present previously consistent with pneumonia.  No effusion or pneumothorax.  Right lung clear.  Normal cardiac size.  Negative osseous structures.  IMPRESSION: Acute left upper lobe and lingular pneumonia.   Original Report Authenticated By: Davonna Belling, M.D.     EKG: Independently reviewed.  Assessment/Plan Principal Problem:  *CAP (community acquired pneumonia) Active Problems:  Paraplegia  History of decubitus ulcer  Sepsis   1. CAP - located in the LUL as demonstrated on CXR, admit to obs, patient already started on rocephin in ED, will add levaquin for atypical coverage since there is concern about "-mycin"  allergy will hold off on ordering azithromycin and doxycycline (vibramycin) on this patient.  Ordering breathing treatments PRN with albuterol and an evaluation by respiratory care for the patients PNA.  Tylenol PRN for fever, repeat labs in AM. 2. Sepsis - pneumonia is source, has 3/4 SIRS criteria (leukocytosis, tachycardia, fever). 3. H/o decubitis ulcer and paraplegia - will put nursing communication order in to obtain an air mattress for patient at the patients request.  No consults obtained.  Code Status: Full (must indicate code status--if unknown or must be presumed, indicate so) Family Communication: Spoke with patient and mother who is at bedside (indicate person spoken with, if applicable, with phone number if by telephone) Disposition Plan: Admit to obs (indicate anticipated LOS)  Time spent: 50 min  GARDNER, JARED M. [facility] Pager [phone]  If 7PM-7AM, please contact night-coverage [email or web address] Password [email or web address] 02/16/2012, 1:46  AM

## 2012-02-16 NOTE — ED Provider Notes (Signed)
Medical screening examination/treatment/procedure(s) were performed by non-physician practitioner and as supervising physician I was immediately available for consultation/collaboration.   Rolan Bucco, MD 02/16/12 0001

## 2012-02-16 NOTE — Progress Notes (Signed)
Utilization review completed.  

## 2012-02-16 NOTE — Progress Notes (Signed)
[facility] PROGRESS NOTE  [name]:[id number] DOB: 1980-09-24 DOA: 02/15/2012 PCP: Thora Lance, MD  Assessment/Plan: 1. CAP with early sepsis--improved. Narrow to monotherapy with Levaquin. 2. Hypokalemia--replete. 3. Decubitus ulcer--present on admission. Air mattress. 4. 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 Continue baclofen, diazepam, gabapentin.  Code Status: Full code Family Communication: discussed with mother at bedside Disposition Plan: home when improved  [name], MD  [facility] Team 2 Pager [phone]. If 8PM-8AM, please contact night-coverage at [email or web address], password [facility] 02/16/2012, 3:34 PM  LOS: 1 day   Brief narrative: 31 year old woman presented to ED with cough, fever, chills and found to have pneumonia.  Consultants:  None  Procedures:  None  Antibiotics:  Levaquin 11/16 >>  Ceftriaxone 11/16 >> 11/16  HPI/Subjective: Feels a little better.   Objective: Filed Vitals:   02/16/12 0656 02/16/12 0742 02/16/12 1030 02/16/12 1453  BP: 104/70  95/62   Pulse: 93  101   Temp: 97.9 F (36.6 C)  98 F (36.7 C)   TempSrc:   Oral   Resp: 24  20   Height:   5\' 3"  (1.6 m)   Weight:   64.864 kg (143 lb)   SpO2: 95% 94% 93% 97%    Intake/Output Summary (Last 24 hours) at 02/16/12 1534 Last data filed at 02/16/12 1300  Gross per 24 hour  Intake 468.33 ml  Output   1175 ml  Net -706.67 ml   Filed Weights   02/16/12 0342 02/16/12 1030  Weight: 65 kg (143 lb 4.8 oz) 64.864 kg (143 lb)    Exam:  General:  Appears calm and comfortable Cardiovascular: RRR, no m/r/g. No LE edema. Respiratory: CTA bilaterally, no w/r/r. Normal respiratory effort. Psychiatric: grossly normal mood and affect, speech fluent and appropriate  Data Reviewed: Basic Metabolic Panel:  Lab 02/16/12 8657 02/15/12 2355  NA 138 135  K 3.0* 3.4*  CL 101 96  CO2 25 27  GLUCOSE 105* 99  BUN 12 12    CREATININE 0.37* 0.42*  CALCIUM 8.6 9.2  MG -- --  PHOS -- --   CBC:  Lab 02/16/12 0515 02/15/12 2355  WBC 10.7* 13.5*  NEUTROABS -- 11.2*  HGB 11.7* 13.5  HCT 34.8* 39.3  MCV 83.3 82.6  PLT 210 273   Studies: Dg Chest 2 View  02/15/2012  *RADIOLOGY REPORT*  Clinical Data: Cough with flu-like symptoms and congestion.  CHEST - 2 VIEW  Comparison: 10/16/2011  Findings: Abnormal density in the left upper lobe and lingula not present previously consistent with pneumonia.  No effusion or pneumothorax.  Right lung clear.  Normal cardiac size.  Negative osseous structures.  IMPRESSION: Acute left upper lobe and lingular pneumonia.   Original Report Authenticated By: Davonna Belling, M.D.     Scheduled Meds:   . [COMPLETED] sodium chloride   Intravenous Once  . sodium chloride   Intravenous Once  . [COMPLETED] acetaminophen  650 mg Oral Once  . albuterol  2.5 mg Nebulization TID  . baclofen  20 mg Oral TID  . cefTRIAXone (ROCEPHIN)  IV  1 g Intravenous Q24H  . diazepam  5-10 mg Oral Q8H  . fluticasone  2 spray Each Nare Daily  . gabapentin  800 mg Oral TID  . heparin  5,000 Units Subcutaneous Q8H  . ipratropium  0.5 mg Nebulization TID  . lamoTRIgine  125 mg Oral Daily  . levofloxacin (LEVAQUIN) IV  750 mg Intravenous Q24H  . loratadine  10 mg  Oral Daily  . lubiprostone  8 mcg Oral BID WC  . pantoprazole  80 mg Oral Daily  . potassium chloride  40 mEq Oral BID  . sertraline  100 mg Oral Daily   Continuous Infusions:   . sodium chloride 100 mL/hr at 02/16/12 1610    Principal Problem:  *CAP (community acquired pneumonia) Active Problems:  Paraplegia  History of decubitus ulcer  Sepsis  Hypokalemia     [name], MD  [facility] Team 2 Pager [phone]. If 8PM-8AM, please contact night-coverage at [email or web address], password [facility] 02/16/2012, 3:34 PM  LOS: 1 day   Time spent: 25 minutes

## 2012-02-17 DIAGNOSIS — A419 Sepsis, unspecified organism: Principal | ICD-10-CM

## 2012-02-17 DIAGNOSIS — G8220 Paraplegia, unspecified: Secondary | ICD-10-CM

## 2012-02-17 DIAGNOSIS — J189 Pneumonia, unspecified organism: Secondary | ICD-10-CM

## 2012-02-17 LAB — CBC
MCHC: 33.1 g/dL (ref 30.0–36.0)
Platelets: 202 10*3/uL (ref 150–400)
RDW: 14 % (ref 11.5–15.5)

## 2012-02-17 LAB — BASIC METABOLIC PANEL
BUN: 5 mg/dL — ABNORMAL LOW (ref 6–23)
Creatinine, Ser: 0.39 mg/dL — ABNORMAL LOW (ref 0.50–1.10)
GFR calc Af Amer: >90 mL/min (ref >90)
GFR calc non Af Amer: >90 mL/min (ref >90)

## 2012-02-17 LAB — LEGIONELLA ANTIGEN, URINE: Legionella Antigen, Urine: NEGATIVE

## 2012-02-17 MED ORDER — ENSURE COMPLETE PO LIQD
237.0000 mL | ORAL | Status: DC
  Administered 2012-02-17: 237 mL via ORAL

## 2012-02-17 NOTE — Plan of Care (Signed)
Problem: Phase II Progression Outcomes Goal: Progress activity as tolerated unless otherwise ordered Outcome: Not Applicable Date Met:  02/17/12 Patient is wc bound at baseline  Goal: Discharge plan established Outcome: Progressing Home with mother who is caregiver  Goal: Obtain order to discontinue catheter if appropriate Outcome: Not Applicable Date Met:  02/17/12 Has urostomy as baseline

## 2012-02-17 NOTE — Progress Notes (Signed)
INITIAL ADULT NUTRITION ASSESSMENT Date: 02/17/2012   Time: 3:06 PM  Reason for Assessment: Malnutrition screening tool, score 2  INTERVENTION: Ensure Complete at bedtime  ASSESSMENT: Female 31 y.o.  Dx: CAP (community acquired pneumonia)  Hx:  Past Medical History  Diagnosis Date  . Multiple sclerosis   . Depression   . PTSD (post-traumatic stress disorder)   . Paraplegia   . Transverse myelitis     Related Meds:     . albuterol  2.5 mg Nebulization TID  . baclofen  20 mg Oral TID  . diazepam  5-10 mg Oral Q8H  . fluticasone  2 spray Each Nare Daily  . gabapentin  800 mg Oral TID  . heparin  5,000 Units Subcutaneous Q8H  . ipratropium  0.5 mg Nebulization TID  . lamoTRIgine  125 mg Oral Daily  . levofloxacin (LEVAQUIN) IV  750 mg Intravenous Q24H  . loratadine  10 mg Oral Daily  . lubiprostone  8 mcg Oral BID WC  . pantoprazole  80 mg Oral Daily  . [COMPLETED] potassium chloride  40 mEq Oral BID  . sertraline  100 mg Oral Daily  . [DISCONTINUED] sodium chloride   Intravenous Once  . [DISCONTINUED] cefTRIAXone (ROCEPHIN)  IV  1 g Intravenous Q24H     Ht: 5\' 3"  (160 cm)  Wt: 144 lb 10 oz (65.6 kg)  Ideal Wt: 52.4 kg  % Ideal Wt: 125%  Usual Wt: 65 kg % Usual Wt: 100%  Body mass index is 25.62 kg/(m^2). Patient is overweight.  Food/Nutrition Related Hx: Patient with multiple sclerosis, paraplegia. She has a stage 2 sacrum decubitus ulcer. She denies weight loss, and reports steady weight gain of 20 pounds over the last 9 years. Her intake is fair due to nausea, but she reports good intake at home. Patient's mother reports MD recommends intake of nutrition supplements, but these make patient full and she does not eat at mealtime.   Labs:  CMP     Component Value Date/Time   NA 142 02/17/2012 0539   K 4.5 02/17/2012 0539   CL 108 02/17/2012 0539   CO2 26 02/17/2012 0539   GLUCOSE 89 02/17/2012 0539   BUN 5* 02/17/2012 0539   CREATININE 0.39*  02/17/2012 0539   CALCIUM 8.8 02/17/2012 0539   PROT 7.0 01/10/2012 0055   ALBUMIN 3.9 01/10/2012 0055   AST 15 01/10/2012 0055   ALT 14 01/10/2012 0055   ALKPHOS 96 01/10/2012 0055   BILITOT 0.2* 01/10/2012 0055   GFRNONAA >90 02/17/2012 0539   GFRAA >90 02/17/2012 0539    Intake/Output Summary (Last 24 hours) at 02/17/12 1509 Last data filed at 02/17/12 1100  Gross per 24 hour  Intake    100 ml  Output   2050 ml  Net  -1950 ml    Diet Order: General, 10-50% intake  Supplements/Tube Feeding: None  IVF:    [DISCONTINUED] sodium chloride Last Rate: 50 mL/hr at 02/17/12 1111    Estimated Nutritional Needs:   Kcal: 1600-1750 kcal Protein: 90-105 g Fluid: >2.3 L  NUTRITION DIAGNOSIS: -Inadequate oral intake (NI-2.1).  Status: Ongoing  RELATED TO: nausea  AS EVIDENCE BY: 10-50% meal intake  MONITORING/EVALUATION(Goals): Patient will meet 90-100% of estimated nutrition needs  Monitor: PO intake, weight, labs  EDUCATION NEEDS: -No education needs identified at this time  Linnell Fulling, RD, LDN Pager #: [phone] After-Hours Pager #: [phone]   DOCUMENTATION CODES Per approved criteria  -Not Applicable    Linnell Fulling  Alexandra 02/17/2012, 3:06 PM

## 2012-02-17 NOTE — Progress Notes (Signed)
[facility] PROGRESS NOTE  [name] OZH:[id number] DOB: 11-12-1980 DOA: 02/15/2012 PCP: Thora Lance, MD  Assessment/Plan: 1. CAP with early sepsis--continues to improve. Continue Levaquin. 2. Hypokalemia--repleted. 3. Decubitus ulcer--present on admission. Air mattress. 4. 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 Continue baclofen, diazepam, gabapentin.  Code Status: Full code Family Communication: none present Disposition Plan: home when improved, anticipate 1-2 days  [name], MD  [facility] Team 2 Pager [phone]. If 8PM-8AM, please contact night-coverage at [email or web address], password [facility] 02/17/2012, 1:50 PM  LOS: 2 days   Brief narrative: 31 year old woman presented to ED with cough, fever, chills and found to have pneumonia.  Consultants:  None  Procedures:  None  Antibiotics:  Levaquin 11/16 >>  Ceftriaxone 11/16 >> 11/16  HPI/Subjective: Slept poorly secondary to noise. No pain in chest. Breathing ok. Nausea persists (a chronic issue, worse in hospital).  Objective: Filed Vitals:   02/16/12 2300 02/17/12 0617 02/17/12 0904 02/17/12 1229  BP:  105/73  102/69  Pulse:  94  90  Temp:  98.3 F (36.8 C)  98.5 F (36.9 C)  TempSrc:  Oral  Oral  Resp:  20  20  Height: 5\' 3"  (1.6 m)     Weight: 65.6 kg (144 lb 10 oz)     SpO2:  90% 94% 97%    Intake/Output Summary (Last 24 hours) at 02/17/12 1350 Last data filed at 02/17/12 1100  Gross per 24 hour  Intake    100 ml  Output   2050 ml  Net  -1950 ml   Filed Weights   02/16/12 0342 02/16/12 1030 02/16/12 2300  Weight: 65 kg (143 lb 4.8 oz) 64.864 kg (143 lb) 65.6 kg (144 lb 10 oz)    Exam:  General:  Appears calm and comfortable Cardiovascular: RRR, no m/r/g. No LE edema. Respiratory: CTA bilaterally, no w/r/r. Normal respiratory effort. Psychiatric: grossly normal mood and affect, speech fluent and appropriate  Exam current  11/17  Data Reviewed: Basic Metabolic Panel:  Lab 02/17/12 1324 02/16/12 0515 02/15/12 2355  NA 142 138 135  K 4.5 3.0* 3.4*  CL 108 101 96  CO2 26 25 27   GLUCOSE 89 105* 99  BUN 5* 12 12  CREATININE 0.39* 0.37* 0.42*  CALCIUM 8.8 8.6 9.2  MG -- -- --  PHOS -- -- --   CBC:  Lab 02/17/12 0539 02/16/12 0515 02/15/12 2355  WBC 6.2 10.7* 13.5*  NEUTROABS -- -- 11.2*  HGB 11.8* 11.7* 13.5  HCT 35.7* 34.8* 39.3  MCV 83.8 83.3 82.6  PLT 202 210 273   Studies: Dg Chest 2 View  02/15/2012  *RADIOLOGY REPORT*  Clinical Data: Cough with flu-like symptoms and congestion.  CHEST - 2 VIEW  Comparison: 10/16/2011  Findings: Abnormal density in the left upper lobe and lingula not present previously consistent with pneumonia.  No effusion or pneumothorax.  Right lung clear.  Normal cardiac size.  Negative osseous structures.  IMPRESSION: Acute left upper lobe and lingular pneumonia.   Original Report Authenticated By: Davonna Belling, M.D.     Scheduled Meds:    . albuterol  2.5 mg Nebulization TID  . baclofen  20 mg Oral TID  . diazepam  5-10 mg Oral Q8H  . fluticasone  2 spray Each Nare Daily  . gabapentin  800 mg Oral TID  . heparin  5,000 Units Subcutaneous Q8H  . ipratropium  0.5 mg Nebulization TID  . lamoTRIgine  125 mg Oral Daily  .  levofloxacin (LEVAQUIN) IV  750 mg Intravenous Q24H  . loratadine  10 mg Oral Daily  . lubiprostone  8 mcg Oral BID WC  . pantoprazole  80 mg Oral Daily  . [COMPLETED] potassium chloride  40 mEq Oral BID  . sertraline  100 mg Oral Daily  . [DISCONTINUED] sodium chloride   Intravenous Once  . [DISCONTINUED] cefTRIAXone (ROCEPHIN)  IV  1 g Intravenous Q24H   Continuous Infusions:    . sodium chloride 50 mL/hr at 02/17/12 1111    Principal Problem:  *CAP (community acquired pneumonia) Active Problems:  Paraplegia  History of decubitus ulcer  Sepsis  Hypokalemia     [name], MD  [facility] Team 2 Pager [phone]. If  8PM-8AM, please contact night-coverage at [email or web address], password [facility] 02/17/2012, 1:50 PM  LOS: 2 days   Time spent: 15 minutes

## 2012-02-18 ENCOUNTER — Encounter (HOSPITAL_COMMUNITY): Payer: Self-pay

## 2012-02-18 MED ORDER — LEVOFLOXACIN 750 MG PO TABS: 750.0000 mg | ORAL_TABLET | Freq: Every day | ORAL | Status: DC

## 2012-02-18 MED ORDER — LEVOFLOXACIN 750 MG PO TABS
750.0000 mg | ORAL_TABLET | Freq: Every day | ORAL | Status: DC
  Filled 2012-02-18: qty 1

## 2012-02-18 NOTE — Progress Notes (Signed)
[facility] PROGRESS NOTE  [name] UJW:[id number] DOB: 07/21/1980 DOA: 02/15/2012 PCP: Thora Lance, MD  Assessment/Plan: 1. CAP with early sepsis--rseolved. Finish Levaquin as outpatient. 2. Hypokalemia--repleted. 3. Decubitus ulcer--present on admission. Air mattress. 4. 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 Continue baclofen, diazepam, gabapentin.  Code Status: Full code Family Communication: discussed with mother at bedside Disposition Plan: home today with mother  Veronica Sacks, MD  [facility] Team 2 Pager [phone]. If 8PM-8AM, please contact night-coverage at [email or web address], password [facility] 02/18/2012, 3:02 PM  LOS: 3 days   Brief narrative: 31 year old woman presented to ED with cough, fever, chills and found to have pneumonia.  Consultants:  None  Procedures:  None  Antibiotics:  Levaquin 11/16 >> 11/23  Ceftriaxone 11/16 >> 11/16  HPI/Subjective: Feels great. No nausea. Ready to go home.  Objective: Filed Vitals:   02/17/12 2059 02/17/12 2136 02/18/12 0644 02/18/12 0919  BP:  102/67 94/65   Pulse:  111 89   Temp:  98 F (36.7 C) 97.8 F (36.6 C)   TempSrc:  Oral Oral   Resp:  20 18   Height:      Weight:      SpO2: 94% 94% 94% 93%    Intake/Output Summary (Last 24 hours) at 02/18/12 1502 Last data filed at 02/18/12 0900  Gross per 24 hour  Intake    927 ml  Output   1450 ml  Net   -523 ml   Filed Weights   02/16/12 0342 02/16/12 1030 02/16/12 2300  Weight: 65 kg (143 lb 4.8 oz) 64.864 kg (143 lb) 65.6 kg (144 lb 10 oz)    Exam:  General:  Appears calm and comfortable Cardiovascular: RRR, no m/r/g.  Respiratory: CTA bilaterally, no w/r/r. Normal respiratory effort. Psychiatric: grossly normal mood and affect, speech fluent and appropriate  Data Reviewed: Basic Metabolic Panel:  Lab 02/17/12 6578 02/16/12 0515 02/15/12 2355  NA 142 138 135  K 4.5 3.0* 3.4*  CL 108 101  96  CO2 26 25 27   GLUCOSE 89 105* 99  BUN 5* 12 12  CREATININE 0.39* 0.37* 0.42*  CALCIUM 8.8 8.6 9.2  MG -- -- --  PHOS -- -- --   CBC:  Lab 02/17/12 0539 02/16/12 0515 02/15/12 2355  WBC 6.2 10.7* 13.5*  NEUTROABS -- -- 11.2*  HGB 11.8* 11.7* 13.5  HCT 35.7* 34.8* 39.3  MCV 83.8 83.3 82.6  PLT 202 210 273   Studies: No results found.  Scheduled Meds:    . albuterol  2.5 mg Nebulization TID  . baclofen  20 mg Oral TID  . diazepam  5-10 mg Oral Q8H  . feeding supplement  237 mL Oral Q24H  . fluticasone  2 spray Each Nare Daily  . gabapentin  800 mg Oral TID  . heparin  5,000 Units Subcutaneous Q8H  . ipratropium  0.5 mg Nebulization TID  . lamoTRIgine  125 mg Oral Daily  . levofloxacin (LEVAQUIN) IV  750 mg Intravenous Q24H  . loratadine  10 mg Oral Daily  . lubiprostone  8 mcg Oral BID WC  . pantoprazole  80 mg Oral Daily  . sertraline  100 mg Oral Daily   Continuous Infusions:    Principal Problem:  *CAP (community acquired pneumonia) Active Problems:  Paraplegia  History of decubitus ulcer  Sepsis  Hypokalemia     Veronica Sacks, MD  [facility] Team 2 Pager [phone]. If 8PM-8AM, please contact night-coverage at [email or web address], password [facility]  02/18/2012, 3:02 PM  LOS: 3 days

## 2012-02-18 NOTE — Discharge Summary (Signed)
Physician Discharge Summary  [name] ZOX:[id number] DOB: [birth year] DOA: 02/15/2012  PCP: Veronica Lance, MD  Admit date: 02/15/2012 Discharge date: 02/18/2012  Recommendations for Outpatient Follow-up:  1. Follow-up resolution of pneumonia  Follow-up Information    Follow up with Veronica Lance, MD. In 2 weeks.   Contact information:   [address] [place] [address] [phone]         Discharge Diagnoses:  1. CAP with early sepsis 2. Hypokalemia 3. Decubitus ulcer 4. Paraplegia  Discharge Condition: improved Disposition: home  Diet recommendation: regular  History of present illness:  31 year old woman presented to ED with cough, fever, chills and found to have pneumonia.   Hospital Course:  [name] was admitted for CAP with early sepsis. Her course was uncomplicated. She quickly improved with IV Levaquin. She will complete oral antibiotics as an outpatient.  1. CAP with early sepsis--resolved. Finish Levaquin as outpatient.  2. Hypokalemia--repleted.  3. Decubitus ulcer--present on admission. Air mattress.  4. 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 Continue baclofen, diazepam, gabapentin.  Consultants:  None  Procedures:  None  Antibiotics:  Levaquin 11/16 >> 11/23   Ceftriaxone 11/16 >> 11/16  Discharge Instructions  Discharge Orders    Future Appointments: Provider: Department: Dept Phone: Center:   02/27/2012 12:40 PM Veronica Oyster, MD [name] [phone] [facility]     Future Orders Please Complete By Expires   Diet general      Discharge instructions      Comments:   Be sure to finish Levaquin (antibiotic) for pneumonia. Call physician or seek immediate medical attention for fever, shortness of breath, worsening of condition.       Medication List     As of 02/18/2012  3:09 PM    TAKE these medications         baclofen 20 MG tablet   Commonly known as: LIORESAL   Take 20 mg by mouth 3 (three) times daily.      bisacodyl 10 MG suppository   Commonly known as: DULCOLAX   Place 1 suppository (10 mg total) rectally as needed for constipation.      DAYQUIL MULTI-SYMPTOM PO   Take 10 mLs by mouth as needed. Cold      diazepam 5 MG tablet   Commonly known as: VALIUM   Take 5-10 mg by mouth every 8 (eight) hours. Take 1 tablet in AM, 1 tablet in PM, and 2 tablets at bedtime      fluticasone 50 MCG/ACT nasal spray   Commonly known as: FLONASE   Place 2 sprays into the nose daily.      gabapentin 800 MG tablet   Commonly known as: NEURONTIN   Take 800 mg by mouth 3 (three) times daily.      ibuprofen 200 MG tablet   Commonly known as: ADVIL,MOTRIN   Take 400 mg by mouth every 6 (six) hours as needed. For pain      lamoTRIgine 25 MG tablet   Commonly known as: LAMICTAL   Take 125 mg by mouth daily.      levofloxacin 750 MG tablet   Commonly known as: LEVAQUIN   Take 1 tablet (750 mg total) by mouth daily. Start 11/19 approximately 4 pm.      loratadine 10 MG tablet   Commonly known as: CLARITIN   Take 10 mg by mouth daily.      lubiprostone 8 MCG capsule   Commonly known  as: AMITIZA   Take 8 mcg by mouth 2 (two) times daily with a meal.      multivitamin with minerals Tabs   Take 1 tablet by mouth daily.      NYQUIL MULTI-SYMPTOM PO   Take 10 mLs by mouth as needed. Cold      omeprazole 40 MG capsule   Commonly known as: PRILOSEC   Take 40 mg by mouth daily.      ondansetron 8 MG tablet   Commonly known as: ZOFRAN   Take 8 mg by mouth every 8 (eight) hours as needed. For nausea      rizatriptan 10 MG disintegrating tablet   Commonly known as: MAXALT-MLT   Take 10 mg by mouth as needed. For migraines; may repeat in 1 hour if needed.      sertraline 100 MG tablet   Commonly known as: ZOLOFT   Take 100 mg by mouth daily.        The results of significant diagnostics from this hospitalization  (including imaging, microbiology, ancillary and laboratory) are listed below for reference.    Significant Diagnostic Studies: Dg Chest 2 View  02/15/2012  *RADIOLOGY REPORT*  Clinical Data: Cough with flu-like symptoms and congestion.  CHEST - 2 VIEW  Comparison: 10/16/2011  Findings: Abnormal density in the left upper lobe and lingula not present previously consistent with pneumonia.  No effusion or pneumothorax.  Right lung clear.  Normal cardiac size.  Negative osseous structures.  IMPRESSION: Acute left upper lobe and lingular pneumonia.   Original Report Authenticated By: [name], M.D.     Microbiology: Recent Results (from the past 240 hour(s))  CULTURE, BLOOD (ROUTINE X 2)     Status: Normal (Preliminary result)   Collection Time   02/16/12  5:15 AM      Component Value Range Status Comment   Specimen Description BLOOD RIGHT AC   Final    Special Requests BOTTLES DRAWN AEROBIC AND ANAEROBIC 2 CC EACH   Final    Culture  Setup Time 02/16/2012 13:53   Final    Culture     Final    Value:        BLOOD CULTURE RECEIVED NO GROWTH TO DATE CULTURE WILL BE HELD FOR 5 DAYS BEFORE ISSUING A FINAL NEGATIVE REPORT   Report Status PENDING   Incomplete   CULTURE, BLOOD (ROUTINE X 2)     Status: Normal (Preliminary result)   Collection Time   02/16/12  5:30 AM      Component Value Range Status Comment   Specimen Description BLOOD RIGHT HAND   Final    Special Requests BOTTLES DRAWN AEROBIC AND ANAEROBIC 2 CC EACH   Final    Culture  Setup Time 02/16/2012 13:45   Final    Culture     Final    Value:        BLOOD CULTURE RECEIVED NO GROWTH TO DATE CULTURE WILL BE HELD FOR 5 DAYS BEFORE ISSUING A FINAL NEGATIVE REPORT   Report Status PENDING   Incomplete      Labs: Basic Metabolic Panel:  Lab 02/17/12 1610 02/16/12 0515 02/15/12 2355  NA 142 138 135  K 4.5 3.0* 3.4*  CL 108 101 96  CO2 26 25 27   GLUCOSE 89 105* 99  BUN 5* 12 12  CREATININE 0.39* 0.37* 0.42*  CALCIUM 8.8 8.6 9.2    MG -- -- --  PHOS -- -- --   CBC:  Lab 02/17/12  1610 02/16/12 0515 02/15/12 2355  WBC 6.2 10.7* 13.5*  NEUTROABS -- -- 11.2*  HGB 11.8* 11.7* 13.5  HCT 35.7* 34.8* 39.3  MCV 83.8 83.3 82.6  PLT 202 210 273    Principal Problem:  *CAP (community acquired pneumonia) Active Problems:  Paraplegia  History of decubitus ulcer  Sepsis  Hypokalemia   Time coordinating discharge: 25 minutes  Signed:  [name], MD [facility] 02/18/2012, 3:09 PM

## 2012-02-18 NOTE — Progress Notes (Signed)
Discharge instructions given to pt/mom, verbalizes understanding. Left the unit in stable condition.

## 2012-02-22 LAB — CULTURE, BLOOD (ROUTINE X 2): Culture: NO GROWTH

## 2012-02-27 ENCOUNTER — Encounter: Payer: Medicaid Other | Admitting: Physical Medicine & Rehabilitation

## 2012-03-17 ENCOUNTER — Other Ambulatory Visit: Payer: Self-pay | Admitting: Physician Assistant

## 2012-03-17 ENCOUNTER — Ambulatory Visit
Admission: RE | Admit: 2012-03-17 | Discharge: 2012-03-17 | Disposition: A | Discharge status: Home / Self Care | Payer: Medicaid Other | Source: Ambulatory Visit | Attending: Physician Assistant | Admitting: Physician Assistant

## 2012-03-17 DIAGNOSIS — J189 Pneumonia, unspecified organism: Secondary | ICD-10-CM

## 2012-04-14 ENCOUNTER — Encounter: Payer: Medicaid Other | Attending: Physical Medicine & Rehabilitation | Admitting: Physical Medicine & Rehabilitation

## 2012-08-11 ENCOUNTER — Telehealth: Payer: Self-pay | Admitting: Diagnostic Neuroimaging

## 2012-08-21 ENCOUNTER — Other Ambulatory Visit: Payer: Self-pay | Admitting: Diagnostic Neuroimaging

## 2012-08-21 DIAGNOSIS — G373 Acute transverse myelitis in demyelinating disease of central nervous system: Secondary | ICD-10-CM

## 2012-10-20 ENCOUNTER — Ambulatory Visit (INDEPENDENT_AMBULATORY_CARE_PROVIDER_SITE_OTHER): Payer: Medicaid Other | Admitting: Diagnostic Neuroimaging

## 2012-10-20 VITALS — BP 97/71 | HR 82 | Temp 98.0°F | Wt 145.0 lb

## 2012-10-20 DIAGNOSIS — G373 Acute transverse myelitis in demyelinating disease of central nervous system: Secondary | ICD-10-CM

## 2012-10-20 DIAGNOSIS — G0489 Other myelitis: Secondary | ICD-10-CM

## 2012-10-20 NOTE — Patient Instructions (Signed)
Continue current meds 

## 2012-10-20 NOTE — Progress Notes (Signed)
[facility]  PATIENT: [name] DOB: 1980-05-20  REFERRING CLINICIAN:  HISTORY FROM: patient REASON FOR VISIT: follow up   HISTORICAL  CHIEF COMPLAINT:  Chief Complaint  Patient presents with  . Follow-up    Rv #6    HISTORY OF PRESENT ILLNESS:   UPDATE 10/20/12: stable, but here to request wheelchair replacement evaluation and rx. Her chair is approx 32 years old and wearing out. Hoping to get an upgraded model that is safer (she get stuck in the straping due to spasms, and wheels are wearing out).  UPDATE 12/17/11: No new symptoms. still on diazepam, baclofen and gabapentin. stable dosing for several years now.   UPDATE 12/25/10: complains of more fatigue.  also with facial rash for last few weeks.  otherwise muscle spasms and weakness are unchanged.  toelrating gabapentin, diazepam, and baclofen.  UPDATE 05/24/10: Doing very well, since her last visit, when her intake of gabapentin was increased to 800 mg tid. Since then she did not have as much muscle spasm between the intake of her medication as she had before.She also states that she did not have to take any antibiotics in the last 6 month, which also contribute to her feeling much better. The patient also reports that her mood has improved, because now she has a more active social life, she also has a boy friend right now.  The patient is asking for a refill of Zomig for her seldom occuring migraines, which we will order.  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 Thad Ranger.  Doing well since last visit (08/11/09).  Having some more muscle spasm / clonus in feet / calves.    PRIOR HPI: Patient returns today for followup. She has been followed in this office since February 2005, when she suffered acute cervical transverse myelitis involving C4-C7, with resultant spastic paraplegia, left greater than right upper extremity weakness, and  urinary dysfunction. She had a presumed syncopal spell in the summer of 2009, for which MRI and EEG were unrevealing. She has flexor spasms, for which she takes baclofen, Neurontin, and Valium. She has seen a psychiatrist who has diagnosed her with major depression and PTSD. Her psychiatrist placed her on Lamictal, which has been helpful for her mood. I last saw her in November.  REVIEW OF SYSTEMS: Full 14 system review of systems performed and notable only for fatigue swelling in legs ringing in ears itching incontinence constipation allergy feeling hot headache numbness weakness sleepiness restless legs depression anxiety.  ALLERGIES: Allergies  Allergen Reactions  . Other Anaphylaxis    ALL -MYCINS  . Aspirin Other (See Comments)    Reaction unknown  . Iodine Other (See Comments)    Reaction unknown  . Latex Other (See Comments)    Reaction unknown  . Shellfish-Derived Products Other (See Comments)    Reaction unknown  . Vancomycin Other (See Comments)    Reaction unknown    HOME MEDICATIONS: Outpatient Prescriptions Prior to Visit  Medication Sig Dispense Refill  . baclofen (LIORESAL) 20 MG tablet Take 20 mg by mouth 3 (three) times daily.      . bisacodyl (DULCOLAX) 10 MG suppository Place 1 suppository (10 mg total) rectally as needed for constipation.  12 suppository  0  . diazepam (VALIUM) 5 MG tablet Take 5-10 mg by mouth every 8 (eight) hours. Take 1 tablet in AM, 1 tablet in PM, and 2 tablets at bedtime      .  fluticasone (FLONASE) 50 MCG/ACT nasal spray Place 2 sprays into the nose daily.      Marland Kitchen gabapentin (NEURONTIN) 800 MG tablet Take 800 mg by mouth 3 (three) times daily.      Marland Kitchen ibuprofen (ADVIL,MOTRIN) 200 MG tablet Take 400 mg by mouth every 6 (six) hours as needed. For pain      . lamoTRIgine (LAMICTAL) 25 MG tablet Take 125 mg by mouth daily.      Marland Kitchen levofloxacin (LEVAQUIN) 750 MG tablet Take 1 tablet (750 mg total) by mouth daily. Start 11/19 approximately 4 pm.  5  tablet  0  . loratadine (CLARITIN) 10 MG tablet Take 10 mg by mouth daily.      Marland Kitchen lubiprostone (AMITIZA) 8 MCG capsule Take 8 mcg by mouth 2 (two) times daily with a meal.      . Multiple Vitamin (MULITIVITAMIN WITH MINERALS) TABS Take 1 tablet by mouth daily.      Marland Kitchen omeprazole (PRILOSEC) 40 MG capsule Take 40 mg by mouth daily.      . ondansetron (ZOFRAN) 8 MG tablet Take 8 mg by mouth every 8 (eight) hours as needed. For nausea      . Pseudoeph-Doxylamine-DM-APAP (NYQUIL MULTI-SYMPTOM PO) Take 10 mLs by mouth as needed. Cold      . Pseudoephedrine-APAP-DM (DAYQUIL MULTI-SYMPTOM PO) Take 10 mLs by mouth as needed. Cold      . rizatriptan (MAXALT-MLT) 10 MG disintegrating tablet Take 10 mg by mouth as needed. For migraines; may repeat in 1 hour if needed.      . sertraline (ZOLOFT) 100 MG tablet Take 100 mg by mouth daily.       No facility-administered medications prior to visit.    PAST MEDICAL HISTORY: Past Medical History  Diagnosis Date  . Multiple sclerosis   . Depression   . PTSD (post-traumatic stress disorder)   . Paraplegia   . Transverse myelitis   . Bladder stones     Hx of  . GERD (gastroesophageal reflux disease)     PAST SURGICAL HISTORY: Past Surgical History  Procedure Laterality Date  . Tonsillectomy    . Cholecystectomy    . Cystectomy w/ ureteroileal conduit    . Wisdom tooth extraction      FAMILY HISTORY: No family history on file.  SOCIAL HISTORY:  History   Social History  . Marital Status: Single    Spouse Name: N/A    Number of Children: N/A  . Years of Education: N/A   Occupational History  . Not on file.   Social History Main Topics  . Smoking status: Never Smoker   . Smokeless tobacco: Never Used  . Alcohol Use: No  . Drug Use: No  . Sexually Active: No   Other Topics Concern  . Not on file   Social History Narrative   Lives with parents.  All disabled.  Attended [employer] until she got transverse myelitis.     PHYSICAL  EXAM  Filed Vitals:   10/20/12 1329  BP: 97/71  Pulse: 82  Temp: 98 F (36.7 C)  TempSrc: Oral  Weight: 145 lb (65.772 kg)    Not recorded    Cannot calculate BMI with a height equal to zero.  GENERAL EXAM: General: Patient is well developed and well groomed. Patient is awake and alert and in no acute distress. Cardiovascular: Regular rate and rhythm with no murmurs. No carotid bruit.  Neurologic Exam  Mental Status: Awake, alert. Language is fluent and comprehensive. Cranial  Nerves: Pupils are equal and round and reactive to light. Conjugate eye movements are full and symmetric. Visual fields are full to confrontation. No evidence of papilledema or other changes on funduscopy. Facial sensation and facial muscle strength are symmetric and in normal limits. Hearing is intact. Palate elevated symmetrically and uvula is midline. Shoulder shrug is symmetric. Tongue is midline. Motor: INCREASED TONE IN LUE > RUE. BUE PROX 4/5; LEFT HAND GRIP, FINGER ABDUCTION 1/5; RIGHT HAND 4/5.  MOTOR TONE INCREASED IN BLE (0/5). Sensory: Intact sensation to light touch, DECREASED SENSATION ON THE LEFT ARM AND LEFT LEG Gait and Station: WHEELCHAIR BOUND Reflexes: BUE 3, KNEES 3, ANKLES 2.    DIAGNOSTIC DATA (LABS, IMAGING, TESTING) - I reviewed patient records, labs, notes, testing and imaging myself where available.  Lab Results  Component Value Date   WBC 6.2 02/17/2012   HGB 11.8* 02/17/2012   HCT 35.7* 02/17/2012   MCV 83.8 02/17/2012   PLT 202 02/17/2012      Component Value Date/Time   NA 142 02/17/2012 0539   K 4.5 02/17/2012 0539   CL 108 02/17/2012 0539   CO2 26 02/17/2012 0539   GLUCOSE 89 02/17/2012 0539   BUN 5* 02/17/2012 0539   CREATININE 0.39* 02/17/2012 0539   CALCIUM 8.8 02/17/2012 0539   PROT 7.0 01/10/2012 0055   ALBUMIN 3.9 01/10/2012 0055   AST 15 01/10/2012 0055   ALT 14 01/10/2012 0055   ALKPHOS 96 01/10/2012 0055   BILITOT 0.2* 01/10/2012 0055   GFRNONAA  >90 02/17/2012 0539   GFRAA >90 02/17/2012 0539   No results found for this basename: CHOL, HDL, LDLCALC, LDLDIRECT, TRIG, CHOLHDL   No results found for this basename: HGBA1C   Lab Results  Component Value Date   VITAMINB12 855 11/04/2007   No results found for this basename: TSH     ASSESSMENT AND PLAN  32 y.o.  female with history of C4-C7 transverse myelitis in Feb 2005.  No active ongoing treatments for transverse myelitis. Symptom management with diazepam, baclofen and gabepentin. Overall stable.  PLAN: 1. continue meds; will ask if PCP can continue refilling her meds 2. Refer to PM&R for mgmt of pain, spasticity 3. Needs wheelchair replacement evaluation; will arrange via [facility]   Suanne Marker, MD 10/20/2012, 2:09 PM Certified in Neurology, Neurophysiology and Neuroimaging  [facility] [address], [address] [place], [place] [address] [phone]

## 2012-10-21 ENCOUNTER — Encounter: Payer: Self-pay | Admitting: Diagnostic Neuroimaging

## 2012-10-22 ENCOUNTER — Other Ambulatory Visit: Payer: Self-pay | Admitting: Neurology

## 2012-10-22 DIAGNOSIS — G8220 Paraplegia, unspecified: Secondary | ICD-10-CM

## 2012-10-22 DIAGNOSIS — G0489 Other myelitis: Secondary | ICD-10-CM

## 2012-10-23 ENCOUNTER — Other Ambulatory Visit: Payer: Self-pay | Admitting: Neurology

## 2012-10-23 DIAGNOSIS — G8220 Paraplegia, unspecified: Secondary | ICD-10-CM

## 2012-10-23 DIAGNOSIS — G0489 Other myelitis: Secondary | ICD-10-CM

## 2013-03-30 ENCOUNTER — Encounter (HOSPITAL_COMMUNITY): Payer: Self-pay | Admitting: Emergency Medicine

## 2013-03-30 ENCOUNTER — Inpatient Hospital Stay (HOSPITAL_COMMUNITY)
Admission: EM | Admit: 2013-03-30 | Discharge: 2013-04-02 | DRG: 689 | Disposition: A | Discharge status: Home / Self Care | Payer: Medicaid Other | Attending: Internal Medicine | Admitting: Internal Medicine

## 2013-03-30 ENCOUNTER — Emergency Department (HOSPITAL_COMMUNITY): Payer: Medicaid Other

## 2013-03-30 DIAGNOSIS — Z936 Other artificial openings of urinary tract status: Secondary | ICD-10-CM

## 2013-03-30 DIAGNOSIS — G0489 Other myelitis: Secondary | ICD-10-CM | POA: Diagnosis present

## 2013-03-30 DIAGNOSIS — R112 Nausea with vomiting, unspecified: Secondary | ICD-10-CM

## 2013-03-30 DIAGNOSIS — K219 Gastro-esophageal reflux disease without esophagitis: Secondary | ICD-10-CM | POA: Diagnosis present

## 2013-03-30 DIAGNOSIS — F4310 Post-traumatic stress disorder, unspecified: Secondary | ICD-10-CM | POA: Diagnosis present

## 2013-03-30 DIAGNOSIS — J101 Influenza due to other identified influenza virus with other respiratory manifestations: Secondary | ICD-10-CM | POA: Diagnosis present

## 2013-03-30 DIAGNOSIS — Z87442 Personal history of urinary calculi: Secondary | ICD-10-CM

## 2013-03-30 DIAGNOSIS — J111 Influenza due to unidentified influenza virus with other respiratory manifestations: Secondary | ICD-10-CM

## 2013-03-30 DIAGNOSIS — F449 Dissociative and conversion disorder, unspecified: Secondary | ICD-10-CM | POA: Diagnosis present

## 2013-03-30 DIAGNOSIS — F3289 Other specified depressive episodes: Secondary | ICD-10-CM | POA: Diagnosis present

## 2013-03-30 DIAGNOSIS — G35D Multiple sclerosis, unspecified: Secondary | ICD-10-CM

## 2013-03-30 DIAGNOSIS — Z79899 Other long term (current) drug therapy: Secondary | ICD-10-CM

## 2013-03-30 DIAGNOSIS — G35 Multiple sclerosis: Secondary | ICD-10-CM | POA: Diagnosis present

## 2013-03-30 DIAGNOSIS — L8991 Pressure ulcer of unspecified site, stage 1: Secondary | ICD-10-CM | POA: Diagnosis present

## 2013-03-30 DIAGNOSIS — E876 Hypokalemia: Secondary | ICD-10-CM

## 2013-03-30 DIAGNOSIS — F329 Major depressive disorder, single episode, unspecified: Secondary | ICD-10-CM | POA: Diagnosis present

## 2013-03-30 DIAGNOSIS — G8220 Paraplegia, unspecified: Secondary | ICD-10-CM | POA: Diagnosis present

## 2013-03-30 DIAGNOSIS — L89109 Pressure ulcer of unspecified part of back, unspecified stage: Secondary | ICD-10-CM | POA: Diagnosis present

## 2013-03-30 DIAGNOSIS — D72829 Elevated white blood cell count, unspecified: Secondary | ICD-10-CM | POA: Diagnosis present

## 2013-03-30 DIAGNOSIS — N390 Urinary tract infection, site not specified: Principal | ICD-10-CM

## 2013-03-30 LAB — URINE MICROSCOPIC-ADD ON

## 2013-03-30 LAB — CBC WITH DIFFERENTIAL/PLATELET
Basophils Relative: 0 % (ref 0–1)
Eosinophils Absolute: 0 10*3/uL (ref 0.0–0.7)
Eosinophils Relative: 0 % (ref 0–5)
MCH: 27.9 pg (ref 26.0–34.0)
MCHC: 33.7 g/dL (ref 30.0–36.0)
MCV: 82.9 fL (ref 78.0–100.0)
Monocytes Relative: 6 % (ref 3–12)
Neutrophils Relative %: 89 % — ABNORMAL HIGH (ref 43–77)
Platelets: 210 10*3/uL (ref 150–400)

## 2013-03-30 LAB — INFLUENZA PANEL BY PCR (TYPE A & B): H1N1 flu by pcr: NOT DETECTED

## 2013-03-30 LAB — URINALYSIS, ROUTINE W REFLEX MICROSCOPIC
Bilirubin Urine: NEGATIVE
Glucose, UA: NEGATIVE mg/dL
Hgb urine dipstick: NEGATIVE
Ketones, ur: NEGATIVE mg/dL
Nitrite: POSITIVE — AB
Specific Gravity, Urine: 1.015 (ref 1.005–1.030)
pH: 7 (ref 5.0–8.0)

## 2013-03-30 LAB — BASIC METABOLIC PANEL
BUN: 10 mg/dL (ref 6–23)
Calcium: 8.7 mg/dL (ref 8.4–10.5)
GFR calc Af Amer: >90 mL/min (ref >90)
GFR calc non Af Amer: >90 mL/min (ref >90)
Potassium: 2.9 mEq/L — ABNORMAL LOW (ref 3.5–5.1)

## 2013-03-30 MED ORDER — SODIUM CHLORIDE 0.9 % IV SOLN
Freq: Once | INTRAVENOUS | Status: AC
  Administered 2013-03-30: 10:00:00 via INTRAVENOUS

## 2013-03-30 MED ORDER — LAMOTRIGINE 25 MG PO TABS
125.0000 mg | ORAL_TABLET | Freq: Every day | ORAL | Status: DC
  Administered 2013-03-31 – 2013-04-02 (×3): 125 mg via ORAL
  Filled 2013-03-30 (×3): qty 1

## 2013-03-30 MED ORDER — ACETAMINOPHEN 325 MG PO TABS
650.0000 mg | ORAL_TABLET | Freq: Four times a day (QID) | ORAL | Status: DC | PRN
  Administered 2013-03-30 – 2013-03-31 (×2): 650 mg via ORAL
  Filled 2013-03-30 (×2): qty 2

## 2013-03-30 MED ORDER — LORATADINE 10 MG PO TABS
10.0000 mg | ORAL_TABLET | Freq: Every day | ORAL | Status: DC
  Administered 2013-03-30 – 2013-04-02 (×4): 10 mg via ORAL
  Filled 2013-03-30 (×4): qty 1

## 2013-03-30 MED ORDER — DIAZEPAM 5 MG PO TABS
10.0000 mg | ORAL_TABLET | Freq: Every day | ORAL | Status: DC
  Administered 2013-03-30 – 2013-04-01 (×3): 10 mg via ORAL
  Filled 2013-03-30 (×3): qty 2

## 2013-03-30 MED ORDER — LUBIPROSTONE 8 MCG PO CAPS
8.0000 ug | ORAL_CAPSULE | Freq: Two times a day (BID) | ORAL | Status: DC | PRN
  Filled 2013-03-30: qty 1

## 2013-03-30 MED ORDER — DEXTROSE-NACL 5-0.45 % IV SOLN
INTRAVENOUS | Status: DC
  Administered 2013-03-30: 13:00:00 via INTRAVENOUS

## 2013-03-30 MED ORDER — BISACODYL 10 MG RE SUPP: 10.0000 mg | RECTAL | Status: DC | PRN

## 2013-03-30 MED ORDER — BACLOFEN 20 MG PO TABS
20.0000 mg | ORAL_TABLET | Freq: Three times a day (TID) | ORAL | Status: DC
  Administered 2013-03-30 – 2013-04-02 (×9): 20 mg via ORAL
  Filled 2013-03-30 (×11): qty 1

## 2013-03-30 MED ORDER — SODIUM CHLORIDE 0.9 % IV BOLUS (SEPSIS)
1000.0000 mL | Freq: Once | INTRAVENOUS | Status: AC
  Administered 2013-03-30: 1000 mL via INTRAVENOUS

## 2013-03-30 MED ORDER — POTASSIUM CHLORIDE CRYS ER 20 MEQ PO TBCR
40.0000 meq | EXTENDED_RELEASE_TABLET | Freq: Three times a day (TID) | ORAL | Status: DC
  Administered 2013-03-30 (×2): 40 meq via ORAL
  Filled 2013-03-30 (×4): qty 2

## 2013-03-30 MED ORDER — GABAPENTIN 400 MG PO CAPS
800.0000 mg | ORAL_CAPSULE | Freq: Three times a day (TID) | ORAL | Status: DC
  Administered 2013-03-30 – 2013-04-02 (×9): 800 mg via ORAL
  Filled 2013-03-30 (×11): qty 2

## 2013-03-30 MED ORDER — DIAZEPAM 5 MG PO TABS
5.0000 mg | ORAL_TABLET | Freq: Two times a day (BID) | ORAL | Status: DC
  Administered 2013-03-30 – 2013-04-02 (×6): 5 mg via ORAL
  Filled 2013-03-30 (×6): qty 1

## 2013-03-30 MED ORDER — SODIUM CHLORIDE 0.9 % IV SOLN
INTRAVENOUS | Status: DC
  Administered 2013-03-30 – 2013-04-01 (×4): via INTRAVENOUS

## 2013-03-30 MED ORDER — ONDANSETRON HCL 4 MG/2ML IJ SOLN
4.0000 mg | Freq: Four times a day (QID) | INTRAMUSCULAR | Status: DC | PRN
  Administered 2013-03-30 – 2013-03-31 (×2): 4 mg via INTRAVENOUS
  Filled 2013-03-30 (×2): qty 2

## 2013-03-30 MED ORDER — GABAPENTIN 800 MG PO TABS
800.0000 mg | ORAL_TABLET | Freq: Three times a day (TID) | ORAL | Status: DC
  Filled 2013-03-30 (×2): qty 1

## 2013-03-30 MED ORDER — ONDANSETRON HCL 4 MG PO TABS
4.0000 mg | ORAL_TABLET | Freq: Four times a day (QID) | ORAL | Status: DC | PRN
  Administered 2013-04-01: 4 mg via ORAL
  Filled 2013-03-30: qty 1

## 2013-03-30 MED ORDER — MORPHINE SULFATE 2 MG/ML IJ SOLN: 1.0000 mg | INTRAMUSCULAR | Status: DC | PRN

## 2013-03-30 MED ORDER — ACETAMINOPHEN 650 MG RE SUPP: 650.0000 mg | Freq: Four times a day (QID) | RECTAL | Status: DC | PRN

## 2013-03-30 MED ORDER — FLUTICASONE PROPIONATE 50 MCG/ACT NA SUSP
2.0000 | Freq: Every day | NASAL | Status: DC
  Administered 2013-03-30 – 2013-04-02 (×4): 2 via NASAL
  Filled 2013-03-30: qty 16

## 2013-03-30 MED ORDER — ENOXAPARIN SODIUM 40 MG/0.4ML ~~LOC~~ SOLN
40.0000 mg | SUBCUTANEOUS | Status: DC
  Administered 2013-03-30 – 2013-04-01 (×3): 40 mg via SUBCUTANEOUS
  Filled 2013-03-30 (×4): qty 0.4

## 2013-03-30 MED ORDER — ONDANSETRON HCL 4 MG/2ML IJ SOLN
4.0000 mg | Freq: Once | INTRAMUSCULAR | Status: AC
  Administered 2013-03-30: 4 mg via INTRAVENOUS
  Filled 2013-03-30: qty 2

## 2013-03-30 MED ORDER — PANTOPRAZOLE SODIUM 40 MG PO TBEC
80.0000 mg | DELAYED_RELEASE_TABLET | Freq: Every day | ORAL | Status: DC
  Administered 2013-03-30 – 2013-04-02 (×4): 80 mg via ORAL
  Filled 2013-03-30 (×3): qty 2

## 2013-03-30 MED ORDER — SERTRALINE HCL 100 MG PO TABS
100.0000 mg | ORAL_TABLET | Freq: Every day | ORAL | Status: DC
  Administered 2013-03-30 – 2013-04-02 (×4): 100 mg via ORAL
  Filled 2013-03-30 (×4): qty 1

## 2013-03-30 MED ORDER — POTASSIUM CHLORIDE CRYS ER 20 MEQ PO TBCR
40.0000 meq | EXTENDED_RELEASE_TABLET | Freq: Once | ORAL | Status: AC
  Administered 2013-03-30: 40 meq via ORAL
  Filled 2013-03-30: qty 2

## 2013-03-30 MED ORDER — DEXTROSE 5 % IV SOLN
1.0000 g | INTRAVENOUS | Status: DC
  Administered 2013-03-30 – 2013-04-01 (×3): 1 g via INTRAVENOUS
  Filled 2013-03-30 (×5): qty 10

## 2013-03-30 MED ORDER — OSELTAMIVIR PHOSPHATE 75 MG PO CAPS
75.0000 mg | ORAL_CAPSULE | Freq: Once | ORAL | Status: AC
  Administered 2013-03-30: 75 mg via ORAL
  Filled 2013-03-30 (×2): qty 1

## 2013-03-30 NOTE — ED Notes (Signed)
Pt c/o vomiting and nasal/head congestion x1day

## 2013-03-30 NOTE — H&P (Signed)
[facility] History and Physical  RILEIGH [name] BMW:[id number] DOB: 1980-10-29 DOA: 03/30/2013  Referring physician: EDP PCP: Thora Lance, MD   Chief Complaint: brought in for nausea, vomiting since yesterday.   HPI: [name] is a 32 y.o. female with prior h/o MS, depression, paraplegia, transverse myelitis, was brought for persistent nausea, vomiting, since yesterday associated with chills and low grade fevers. She also reports some nasal congestion. . She reports her mom was recovering from the similar complaints . She denies diarrhea. She denies sob, or chest pain or abdominal pain. On arrival to ED, she was found dehydrated, hypokalemic and has UTI. She was referred to medical service for admission for management of hypokalemia, and UTI.    Review of Systems:  Constitutional:  No weight loss, night sweats,  fatigue.  HEENT:  No headaches, Difficulty swallowing,Tooth/dental problems, No sneezing, itching, ear ache,  Cardio-vascular:  No chest pain, Orthopnea, PND, swelling in lower extremities, anasarca, dizziness, palpitations  GI:  POSITIVE FOR nausea vomiting. No diarrhea or abd pain.  Resp:  No shortness of breath with exertion or at rest. No excess mucus, no productive cough, No non-productive cough, No coughing up of blood.No change in color of mucus.No wheezing.No chest wall deformity  Skin:  no rash or lesions.  GU:  no dysuria, change in color of urine, no urgency or frequency. No flank pain.  Musculoskeletal:  No joint pain or swelling. No decreased range of motion. No back pain.  Psych:  No change in mood or affect. No depression or anxiety. No memory loss.   Past Medical History  Diagnosis Date  . Multiple sclerosis   . Depression   . PTSD (post-traumatic stress disorder)   . Paraplegia   . Transverse myelitis   . Bladder stones     Hx of  . GERD (gastroesophageal reflux disease)    Past Surgical History  Procedure Laterality Date  .  Tonsillectomy    . Cholecystectomy    . Cystectomy w/ ureteroileal conduit    . Wisdom tooth extraction     Social History:  reports that she has never smoked. She has never used smokeless tobacco. She reports that she does not drink alcohol or use illicit drugs.  Allergies  Allergen Reactions  . Other Anaphylaxis    ALL -MYCINS  . Aspirin Other (See Comments)    Reaction unknown  . Iodine Other (See Comments)    Reaction unknown  . Latex Other (See Comments)    Reaction unknown  . Shellfish-Derived Products Other (See Comments)    Reaction unknown  . Vancomycin Other (See Comments)    Reaction unknown    History reviewed. No pertinent family history.   Prior to Admission medications   Medication Sig Start Date End Date Taking? Authorizing Provider  baclofen (LIORESAL) 20 MG tablet Take 20 mg by mouth 3 (three) times daily.   Yes Historical Provider, MD  bisacodyl (DULCOLAX) 10 MG suppository Place 1 suppository (10 mg total) rectally as needed for constipation. 01/10/12  Yes Peter S Dammen, PA-C  dextromethorphan (DELSYM) 30 MG/5ML liquid Take 30 mg by mouth as needed for cough.   Yes Historical Provider, MD  diazepam (VALIUM) 5 MG tablet Take 5-10 mg by mouth every 8 (eight) hours. Take 1 tablet in AM, 1 tablet in PM, and 2 tablets at bedtime   Yes Historical Provider, MD  fluticasone (FLONASE) 50 MCG/ACT nasal spray Place 2 sprays into the nose daily.   Yes Historical Provider,  MD  gabapentin (NEURONTIN) 800 MG tablet Take 800 mg by mouth 3 (three) times daily.   Yes Historical Provider, MD  ibuprofen (ADVIL,MOTRIN) 200 MG tablet Take 400 mg by mouth every 6 (six) hours as needed. For pain   Yes Historical Provider, MD  lamoTRIgine (LAMICTAL) 25 MG tablet Take 125 mg by mouth daily.   Yes Historical Provider, MD  loratadine (CLARITIN) 10 MG tablet Take 10 mg by mouth daily.   Yes Historical Provider, MD  lubiprostone (AMITIZA) 8 MCG capsule Take 8 mcg by mouth 2 (two) times  daily with a meal.   Yes Historical Provider, MD  Multiple Vitamin (MULITIVITAMIN WITH MINERALS) TABS Take 1 tablet by mouth daily.   Yes Historical Provider, MD  omeprazole (PRILOSEC) 40 MG capsule Take 40 mg by mouth daily.   Yes Historical Provider, MD  ondansetron (ZOFRAN) 8 MG tablet Take 8 mg by mouth every 8 (eight) hours as needed. For nausea   Yes Historical Provider, MD  rizatriptan (MAXALT-MLT) 10 MG disintegrating tablet Take 10 mg by mouth as needed. For migraines; may repeat in 1 hour if needed.   Yes Historical Provider, MD  sertraline (ZOLOFT) 100 MG tablet Take 100 mg by mouth daily.   Yes Historical Provider, MD   Physical Exam: Filed Vitals:   03/30/13 1830  BP:   Pulse:   Temp: 99.1 F (37.3 C)  Resp:     BP 108/64  Pulse 112  Temp(Src) 99.1 F (37.3 C) (Oral)  Resp 18  Ht 5\' 3"  (1.6 m)  Wt 65.772 kg (145 lb)  BMI 25.69 kg/m2  SpO2 98%  LMP 02/28/2013  General:  Appears calm and comfortable Eyes: PERRL, normal lids, irises & conjunctiva Neck: no LAD, masses or thyromegaly Cardiovascular: RRR, no m/r/g. No LE edema. Telemetry: SR, no arrhythmias  Respiratory: CTA bilaterally, no w/r/r. Normal respiratory effort. Abdomen: soft, ntnd, urostomy in plance.  Skin: stage 1 sacral  Musculoskeletal: trace edema.  Psychiatric: grossly normal mood and affect, speech fluent and appropriate           Labs on Admission:  Basic Metabolic Panel:  Recent Labs Lab 03/30/13 0605  NA 135  K 2.9*  CL 99  CO2 23  GLUCOSE 118*  BUN 10  CREATININE 0.43*  CALCIUM 8.7   Liver Function Tests: No results found for this basename: AST, ALT, ALKPHOS, BILITOT, PROT, ALBUMIN,  in the last 168 hours No results found for this basename: LIPASE, AMYLASE,  in the last 168 hours No results found for this basename: AMMONIA,  in the last 168 hours CBC:  Recent Labs Lab 03/30/13 0605  WBC 16.4*  NEUTROABS 14.6*  HGB 13.4  HCT 39.8  MCV 82.9  PLT 210   Cardiac  Enzymes: No results found for this basename: CKTOTAL, CKMB, CKMBINDEX, TROPONINI,  in the last 168 hours  BNP (last 3 results) No results found for this basename: PROBNP,  in the last 8760 hours CBG: No results found for this basename: GLUCAP,  in the last 168 hours  Radiological Exams on Admission: Dg Chest 2 View  03/30/2013   CLINICAL DATA:  Vomiting and flu like symptoms.  EXAM: CHEST  2 VIEW  COMPARISON:  03/17/2012  FINDINGS: The heart size and mediastinal contours are within normal limits. Both lungs are clear. The visualized skeletal structures are unremarkable. No significant change since previous study.  IMPRESSION: No active cardiopulmonary disease.   Electronically Signed   By: Marisa Cyphers.D.  On: 03/30/2013 05:50    EKG: not done.  Assessment/Plan Active Problems:   Multiple sclerosis   Recurrent UTI   Hypokalemia   Influenza   Nausea and vomiting   Nausea vomiting; Possibly secondary to viral gastroenteritis vs possibly from UTI.  Admitted to med surg bed.  Symptomatic treatment with NS fluids, IV anti emetics.  If symptoms tdoesn't improved, please consider imaging of the abdomen.    UTI: Start pt on rocephin as her previous cultures revealed E Coli sensitive to rocephin.  Urine cultures pending. Blood cultures not done.   Low grade temp: Probably from the UTI.   Hypokalemia: Probably from the persistent vomiting.  Replete as needed.  Magnesium levels ordered.  Repeat levels in am.   MS/ paraplegic Bed bound.  Stage 1 sacral.  Frequent change in position.   Transverse Myelitis: Continue with diazepam, gabapentin .   DVT prophylaxis.    Code Status: presumed full code Family Communication:none at bedside, offered to call the mother, but pt wanted me to wait till the morning to call the mom and update her.  Disposition Plan: admitted to inpatient/medsurg.  Time spent: 75 min  [facility] Pager [phone]

## 2013-03-30 NOTE — Care Management Note (Signed)
Cm spoke with patient's mother to assess possible home health needs. Pt has urostomy. Per pt's mother pt from home with mother primary care taker. Per pt's mother had previous private duty care from [facility] and has utilized two other home health services this year. Per pt's mother insurance benefits for [facility] are depleted. Will continue to follow.    Roxy Manns Tallan Sandoz,MSN,RN [phone]

## 2013-03-30 NOTE — ED Notes (Signed)
Report called to floor RN,  

## 2013-03-30 NOTE — ED Provider Notes (Signed)
CSN: [id number]     Arrival date & time 03/30/13  0330 History   First MD Initiated Contact with Patient 03/30/13 0421     Chief Complaint  Patient presents with  . Emesis  . Nasal Congestion   (Consider location/radiation/quality/duration/timing/severity/associated sxs/prior Treatment) HPI Comments: 32 year old female with a rare form of MS that leads her essentially bedbound presents with flulike symptoms last 24 hours. She's been having fever up to 102, cough with phlegm, congestion, sore throat, headache, and vomiting. She states she is unable to keep down oral fluids. She's been having low but of abdominal pain with the vomiting. She's not short of breath. Her mom has similar symptoms.  Patient is a 32 y.o. female presenting with vomiting.  Emesis Associated symptoms: chills, headaches and sore throat   Associated symptoms: no diarrhea     Past Medical History  Diagnosis Date  . Multiple sclerosis   . Depression   . PTSD (post-traumatic stress disorder)   . Paraplegia   . Transverse myelitis   . Bladder stones     Hx of  . GERD (gastroesophageal reflux disease)    Past Surgical History  Procedure Laterality Date  . Tonsillectomy    . Cholecystectomy    . Cystectomy w/ ureteroileal conduit    . Wisdom tooth extraction     No family history on file. History  Substance Use Topics  . Smoking status: Never Smoker   . Smokeless tobacco: Never Used  . Alcohol Use: No   OB History   Grav Para Term Preterm Abortions TAB SAB Ect Mult Living                 Review of Systems  Constitutional: Positive for fever, chills and diaphoresis.  HENT: Positive for congestion and sore throat.   Respiratory: Positive for cough. Negative for shortness of breath.   Gastrointestinal: Positive for nausea and vomiting. Negative for diarrhea.  Neurological: Positive for headaches.  All other systems reviewed and are negative.    Allergies  Other; Aspirin; Iodine; Latex;  Shellfish-derived products; and Vancomycin  Home Medications   Current Outpatient Rx  Name  Route  Sig  Dispense  Refill  . baclofen (LIORESAL) 20 MG tablet   Oral   Take 20 mg by mouth 3 (three) times daily.         . bisacodyl (DULCOLAX) 10 MG suppository   Rectal   Place 1 suppository (10 mg total) rectally as needed for constipation.   12 suppository   0   . dextromethorphan (DELSYM) 30 MG/5ML liquid   Oral   Take 30 mg by mouth as needed for cough.         . diazepam (VALIUM) 5 MG tablet   Oral   Take 5-10 mg by mouth every 8 (eight) hours. Take 1 tablet in AM, 1 tablet in PM, and 2 tablets at bedtime         . fluticasone (FLONASE) 50 MCG/ACT nasal spray   Nasal   Place 2 sprays into the nose daily.         Marland Kitchen gabapentin (NEURONTIN) 800 MG tablet   Oral   Take 800 mg by mouth 3 (three) times daily.         Marland Kitchen ibuprofen (ADVIL,MOTRIN) 200 MG tablet   Oral   Take 400 mg by mouth every 6 (six) hours as needed. For pain         . lamoTRIgine (LAMICTAL) 25 MG tablet  Oral   Take 125 mg by mouth daily.         Marland Kitchen loratadine (CLARITIN) 10 MG tablet   Oral   Take 10 mg by mouth daily.         Marland Kitchen lubiprostone (AMITIZA) 8 MCG capsule   Oral   Take 8 mcg by mouth 2 (two) times daily with a meal.         . Multiple Vitamin (MULITIVITAMIN WITH MINERALS) TABS   Oral   Take 1 tablet by mouth daily.         Marland Kitchen omeprazole (PRILOSEC) 40 MG capsule   Oral   Take 40 mg by mouth daily.         . ondansetron (ZOFRAN) 8 MG tablet   Oral   Take 8 mg by mouth every 8 (eight) hours as needed. For nausea         . rizatriptan (MAXALT-MLT) 10 MG disintegrating tablet   Oral   Take 10 mg by mouth as needed. For migraines; may repeat in 1 hour if needed.         . sertraline (ZOLOFT) 100 MG tablet   Oral   Take 100 mg by mouth daily.          BP 111/98  Pulse 116  Temp(Src) 98.6 F (37 C) (Oral)  Resp 20  SpO2 94%  LMP 02/28/2013 Physical  Exam  Nursing note and vitals reviewed. Constitutional: She is oriented to person, place, and time. She appears well-developed and well-nourished.  HENT:  Head: Normocephalic and atraumatic.  Right Ear: External ear normal.  Left Ear: External ear normal.  Nose: Nose normal.  Mouth/Throat: No oropharyngeal exudate.  Eyes: Right eye exhibits no discharge. Left eye exhibits no discharge.  Cardiovascular: Regular rhythm and normal heart sounds.  Tachycardia present.   Pulmonary/Chest: Effort normal and breath sounds normal.  Abdominal: Soft. There is no tenderness.  Neurological: She is alert and oriented to person, place, and time.  Skin: Skin is warm. She is diaphoretic.    ED Course  Procedures (including critical care time) Labs Review Labs Reviewed  CBC WITH DIFFERENTIAL - Abnormal; Notable for the following:    WBC 16.4 (*)    Neutrophils Relative % 89 (*)    Neutro Abs 14.6 (*)    Lymphocytes Relative 5 (*)    All other components within normal limits  BASIC METABOLIC PANEL - Abnormal; Notable for the following:    Potassium 2.9 (*)    Glucose, Bld 118 (*)    Creatinine, Ser 0.43 (*)    All other components within normal limits   Imaging Review Dg Chest 2 View  03/30/2013   CLINICAL DATA:  Vomiting and flu like symptoms.  EXAM: CHEST  2 VIEW  COMPARISON:  03/17/2012  FINDINGS: The heart size and mediastinal contours are within normal limits. Both lungs are clear. The visualized skeletal structures are unremarkable. No significant change since previous study.  IMPRESSION: No active cardiopulmonary disease.   Electronically Signed   By: Burman Nieves M.D.   On: 03/30/2013 05:50    EKG Interpretation   None       MDM   1. Influenza   2. Hypokalemia    Patient mildly tachycardic here. Given fluids and patient appeared improved. No vomiting while in the ED. Patient able to take po here but appears to still be clinically dehydrated given vitals and dry mucous  membranes. Will admit for fluids and observation.  Audree Camel, MD 03/30/13 [phone]

## 2013-03-30 NOTE — ED Notes (Signed)
Pt presents to ED with mother at bedside c/o fever, N/V, and runny nose.

## 2013-03-31 DIAGNOSIS — N390 Urinary tract infection, site not specified: Secondary | ICD-10-CM | POA: Diagnosis present

## 2013-03-31 LAB — CBC
HCT: 35.1 % — ABNORMAL LOW (ref 36.0–46.0)
MCH: 29.2 pg (ref 26.0–34.0)
MCHC: 34.8 g/dL (ref 30.0–36.0)
MCV: 84 fL (ref 78.0–100.0)
Platelets: 173 10*3/uL (ref 150–400)
RDW: 13.6 % (ref 11.5–15.5)

## 2013-03-31 LAB — BASIC METABOLIC PANEL
BUN: 6 mg/dL (ref 6–23)
CO2: 21 mEq/L (ref 19–32)
Calcium: 8.5 mg/dL (ref 8.4–10.5)
Chloride: 106 mEq/L (ref 96–112)
Creatinine, Ser: 0.41 mg/dL — ABNORMAL LOW (ref 0.50–1.10)
Glucose, Bld: 98 mg/dL (ref 70–99)
Sodium: 138 mEq/L (ref 137–147)

## 2013-03-31 MED ORDER — BAG BALM OINTMENT
TOPICAL_OINTMENT | Freq: Two times a day (BID) | CUTANEOUS | Status: DC
  Administered 2013-03-31 – 2013-04-02 (×5): via TOPICAL
  Filled 2013-03-31: qty 300

## 2013-03-31 NOTE — Progress Notes (Signed)
[facility] PROGRESS NOTE  [name] ION:[id number] DOB: 20-Oct-1980 DOA: 03/30/2013 PCP: Thora Lance, MD  Brief narrative: 32 y.o. female with past medical history of multiple sclerosis, transverse myelitis, paraplegia, recurrent UTI's and urostomy who presented to [facility] ED 03/30/2013 with fevers, chills, nausea and vomiting for past 24 hours prior to this admission. She reports being exposed to sick contacts at home (with similar symptoms).  In ED, BP was 101/53, HR 105, Tmax 100.1 F and oxygen saturation was 93% on room air. Her CBC revealed leukocytosis of 16 and BMP revealed potassium of 2.9 which was repleted in ED. Renal function was WNL. CXR did not reveal acute cardiopulmonary process. She was found to have UTI and was started on empiric rocephin.   Assessment/Plan:  Principal Problem:   UTI (urinary tract infection) - continue rocephin IV daily - follow up urine culture results  - has urostomy - continue to provide supportive care with IV fluids and antiemetics Active Problems:   Nausea and vomiting - secondary to UTI - continue IV fluids and antiemetics   Leukocytosis - WBC count WNL this am - secondary to UTI   Multiple sclerosis - stable   Hypokalemia - secondary to GI losses - repleted in ED - now potassium WNL   Functional Paraplegia - secondary to multiple sclerosis   Code Status: full code Family Communication: no family at the bedside Disposition Plan: home when stable   Manson Passey, MD  [facility] Pager [phone]  If 7PM-7AM, please contact night-coverage [email or web address] Password [email or web address] 03/31/2013, 11:58 AM   LOS: 1 day   Consultants:  None   Procedures:  None   Antibiotics:  Rocephin 03/30/2013 -->  HPI/Subjective: Eating breakfast this am.   Objective: Filed Vitals:   03/30/13 1730 03/30/13 1830 03/30/13 2053 03/31/13 0549  BP:   113/57 112/61  Pulse:   115 106  Temp: 100.1 F (37.8 C) 99.1 F (37.3 C) 100  F (37.8 C) 98.9 F (37.2 C)  TempSrc: Oral Oral Oral Oral  Resp:   22 20  Height:      Weight:      SpO2:   94% 93%    Intake/Output Summary (Last 24 hours) at 03/31/13 1158 Last data filed at 03/31/13 1028  Gross per 24 hour  Intake 451.67 ml  Output    625 ml  Net -173.33 ml    Exam:   General:  Pt is alert, follows commands appropriately, not in acute distress  Cardiovascular: Regular rate and rhythm, S1/S2 appreciated   Respiratory: Clear to auscultation bilaterally, no wheezing, no crackles, no rhonchi  Abdomen: Soft, non tender, non distended, bowel sounds present, urostomy in place  Extremities: No edema, pulses DP and PT palpable bilaterally  Neuro: Grossly nonfocal  Data Reviewed: Basic Metabolic Panel:  Recent Labs Lab 03/30/13 0605 03/30/13 1930 03/31/13 0413  NA 135  --  138  K 2.9*  --  4.6  CL 99  --  106  CO2 23  --  21  GLUCOSE 118*  --  98  BUN 10  --  6  CREATININE 0.43*  --  0.41*  CALCIUM 8.7  --  8.5  MG  --  1.9  --    Liver Function Tests: No results found for this basename: AST, ALT, ALKPHOS, BILITOT, PROT, ALBUMIN,  in the last 168 hours No results found for this basename: LIPASE, AMYLASE,  in the last 168 hours No results found for this basename: AMMONIA,  in the last 168 hours CBC:  Recent Labs Lab 03/30/13 0605 03/31/13 0413  WBC 16.4* 10.1  NEUTROABS 14.6*  --   HGB 13.4 12.2  HCT 39.8 35.1*  MCV 82.9 84.0  PLT 210 173   Cardiac Enzymes: No results found for this basename: CKTOTAL, CKMB, CKMBINDEX, TROPONINI,  in the last 168 hours BNP: No components found with this basename: POCBNP,  CBG: No results found for this basename: GLUCAP,  in the last 168 hours  Recent Results (from the past 240 hour(s))  MRSA PCR SCREENING     Status: None   Collection Time    03/30/13 10:02 AM      Result Value Range Status   MRSA by PCR NEGATIVE  NEGATIVE Final   Comment:            The GeneXpert MRSA Assay (FDA     approved  for NASAL specimens     only), is one component of a     comprehensive MRSA colonization     surveillance program. It is not     intended to diagnose MRSA     infection nor to guide or     monitor treatment for     MRSA infections.  URINE CULTURE     Status: None   Collection Time    03/30/13 11:37 AM      Result Value Range Status   Specimen Description URINE, CLEAN CATCH   Final   Special Requests NONE   Final   Culture  Setup Time     Final   Value: 03/30/2013 19:50     Performed at [facility] Count PENDING   Incomplete   Culture     Final   Value: Culture reincubated for better growth     Performed at [facility]   Report Status PENDING   Incomplete     Studies: Dg Chest 2 View  03/30/2013   CLINICAL DATA:  Vomiting and flu like symptoms.  EXAM: CHEST  2 VIEW  COMPARISON:  03/17/2012  FINDINGS: The heart size and mediastinal contours are within normal limits. Both lungs are clear. The visualized skeletal structures are unremarkable. No significant change since previous study.  IMPRESSION: No active cardiopulmonary disease.   Electronically Signed   By: Burman Nieves M.D.   On: 03/30/2013 05:50    Scheduled Meds: . baclofen  20 mg Oral TID  . cefTRIAXone (ROCEPHIN)  IV  1 g Intravenous Q24H  . diazepam  10 mg Oral QHS  . diazepam  5 mg Oral BID  . enoxaparin (LOVENOX) injection  40 mg Subcutaneous Q24H  . fluticasone  2 spray Each Nare Daily  . gabapentin  800 mg Oral TID  . lamoTRIgine  125 mg Oral Daily  . loratadine  10 mg Oral Daily  . pantoprazole  80 mg Oral Daily  . potassium chloride  40 mEq Oral TID  . sertraline  100 mg Oral Daily   Continuous Infusions: . sodium chloride 75 mL/hr at 03/31/13 0732

## 2013-03-31 NOTE — Care Management Note (Signed)
Cm completed a benefits check of pt's HH benefits. Pt is eligible for home health services if desires upon discharge.   Roxy Manns Normon Pettijohn,MSN,RN [phone]

## 2013-03-31 NOTE — Evaluation (Signed)
Physical Therapy Evaluation Patient Details Name: [name] MRN: [id number] DOB: 03/25/1981 Today's Date: 03/31/2013 Time: 7829-5621 PT Time Calculation (min): 31 min  PT Assessment / Plan / Recommendation History of Present Illness  [name] is a 32 y.o. female with prior h/o MS, depression, paraplegia, transverse myelitis, was brought for persistent nausea, vomiting, since yesterday associated with chills and low grade fevers. She also reports some nasal congestion. . She reports her mom was recovering from the similar complaints . She denies diarrhea. She denies sob, or chest pain or abdominal pain. On arrival to ED, she was found dehydrated, hypokalemic and has UTI. She was referred to medical service for admission for management of hypokalemia, and UTI.  Clinical Impression  Pt has very weak cough. Pt tolerated sitting at the edge of [name] x 10 min with 2 person assist. Pt relates that he mother is primary caregiver, that her father passed away 2 months ago. Pt will benefit from PT to address problems. Pt reports that she prefers to return home. [name] no present.    PT Assessment  Patient needs continued PT services    Follow Up Recommendations  Home health PT;Supervision/Assistance - 24 hour (aide)    Does the patient have the potential to tolerate intense rehabilitation      Barriers to Discharge Decreased caregiver support      Equipment Recommendations  None recommended by PT    Recommendations for Other Services     Frequency Min 3X/week    Precautions / Restrictions Precautions Precautions: Fall Precaution Comments: paraplegia, contact precautions, urostomy drain    Pertinent Vitals/Pain       Mobility  [name] Mobility [name] Mobility: Supine to Sit;Sitting - Scoot to [name];Sit to Supine Supine to Sit: 2: Max assist Sitting - Scoot to [name]: 2: Max assist Sit to Supine: 2: Max assist Details for [name] Mobility Assistance: pt is able to  assist  10% using L ue to push self up to upright position. Transfers Transfers: Not assessed    Exercises     PT Diagnosis: Generalized weakness;Other (comment) (paraplegi)  PT Problem List: Decreased range of motion;Decreased activity tolerance;Decreased balance;Decreased mobility;Cardiopulmonary status limiting activity;Impaired tone;Impaired sensation PT Treatment Interventions: Functional mobility training;Therapeutic activities;Therapeutic exercise;Wheelchair mobility training;Patient/family education     PT Goals(Current goals can be found in the care plan section) Acute Rehab PT Goals Patient Stated Goal: I want to be able to go home. I need more help to give my mom a break. PT Goal Formulation: With patient Time For Goal Achievement: 04/14/13 Potential to Achieve Goals: Good  Visit Information  Last PT Received On: 03/31/13 Assistance Needed: +2 History of Present Illness: [name] is a 32 y.o. female with prior h/o MS, depression, paraplegia, transverse myelitis, was brought for persistent nausea, vomiting, since yesterday associated with chills and low grade fevers. She also reports some nasal congestion. . She reports her mom was recovering from the similar complaints . She denies diarrhea. She denies sob, or chest pain or abdominal pain. On arrival to ED, she was found dehydrated, hypokalemic and has UTI. She was referred to medical service for admission for management of hypokalemia, and UTI.       Prior Functioning  Home Living Family/patient expects to be discharged to:: Private residence Living Arrangements: Parent Available Help at Discharge: Family Type of Home: House Home Access: Level entry Home Layout: One level Home Equipment: Wheelchair - manual;Hospital [name] Additional Comments: conversion [name],  shower inaccessible due to design Prior Function Level of Independence: Needs assistance Gait / Transfers Assistance Needed: mother performs squat pivot  transfer to [name] with pt holding around neck ADL's / Homemaking Assistance Needed: total Communication Communication: No difficulties Dominant Hand: Right    Cognition  Cognition Arousal/Alertness: Awake/alert Behavior During Therapy: WFL for tasks assessed/performed Overall Cognitive Status: Within Functional Limits for tasks assessed    Extremity/Trunk Assessment Upper Extremity Assessment Upper Extremity Assessment: Defer to OT evaluation Lower Extremity Assessment Lower Extremity Assessment: RLE deficits/detail;LLE deficits/detail RLE Deficits / Details: trace toe movement, increased tone into extension. foot drop present LLE Deficits / Details: increased tone, clonus, foot drop present. Cervical / Trunk Assessment Cervical / Trunk Assessment: Kyphotic (scoliotic)   Balance Balance Balance Assessed: Yes Static Sitting Balance Static Sitting - Balance Support: Bilateral upper extremity supported Static Sitting - Level of Assistance: 3: Mod assist Static Sitting - Comment/# of Minutes: pt has no trunk righting resposes, uses UE's to shift weight.  End of Session PT - End of Session Activity Tolerance: Patient tolerated treatment well;Patient limited by fatigue Patient left: in [name];with call bell/phone within reach Nurse Communication: Mobility status  GP     [name] 03/31/2013, 12:56 PM [name] PT [phone]

## 2013-03-31 NOTE — Progress Notes (Signed)
Occupational Therapy Evaluation Patient Details Name: SEMIAH KONCZAL MRN: [id number] DOB: [birth year] Today's Date: 03/31/2013 Time: 8657-8469 OT Time Calculation (min): 21 min  OT Assessment / Plan / Recommendation History of present illness Yanil E Whitehair is a 32 y.o. female with prior h/o MS, depression, paraplegia, transverse myelitis, was brought for persistent nausea, vomiting, since yesterday associated with chills and low grade fevers. She also reports some nasal congestion. . She reports her mom was recovering from the similar complaints . She denies diarrhea. She denies sob, or chest pain or abdominal pain. On arrival to ED, she was found dehydrated, hypokalemic and has UTI. She was referred to medical service for admission for management of hypokalemia, and UTI.   Clinical Impression   Patient's mother present. Patient expressed desire to improve function to decrease burden of care. Recommend HHOT. Patient and mother receptive to this. Mother admitted that her home is "cluttered" and her children call her a "hoarder" and the environment is so cluttered that pt is unable to maneuver w/c appropriately.    OT Assessment  Patient needs continued OT Services    Follow Up Recommendations  Supervision/Assistance - 24 hour;Home health OT    Barriers to Discharge      Equipment Recommendations  None recommended by OT    Recommendations for Other Services    Frequency  Min 2X/week    Precautions / Restrictions Precautions Precautions: Fall Precaution Comments: paraplegia, contact precautions, urostomy drain    Pertinent Vitals/Pain No c/o pain    ADL  Eating/Feeding: Simulated;Minimal assistance Where Assessed - Eating/Feeding: Bed level Grooming: Wash/dry face;Set up Where Assessed - Grooming: Supine, head of bed up Upper Body Bathing: +1 Total assistance Lower Body Bathing: +1 Total assistance Upper Body Dressing: +1 Total assistance Lower Body Dressing: +1 Total  assistance Transfers/Ambulation Related to ADLs: [name] does squat pivot transfer bed<>w/c. W/c use for mobility. ADL Comments: Mother does bed bath. Pt able to wash face and part of UB. Otherwise dependent with rest of bath, getting dressed, grooming, and toileting.    OT Diagnosis: Generalized weakness  OT Problem List: Decreased strength;Decreased range of motion;Decreased activity tolerance;Impaired balance (sitting and/or standing);Decreased coordination;Impaired tone;Impaired UE functional use OT Treatment Interventions: Self-care/ADL training;Therapeutic exercise;Neuromuscular education;DME and/or AE instruction;Therapeutic activities;Patient/family education   OT Goals(Current goals can be found in the care plan section) Acute Rehab OT Goals Patient Stated Goal: I want to be able to go home. I need more help to give my mom a break. OT Goal Formulation: With patient Time For Goal Achievement: 04/14/13 Potential to Achieve Goals: Good  Visit Information  Last OT Received On: 03/31/13 Assistance Needed: +2 History of Present Illness: Renny E Dodds is a 32 y.o. female with prior h/o MS, depression, paraplegia, transverse myelitis, was brought for persistent nausea, vomiting, since yesterday associated with chills and low grade fevers. She also reports some nasal congestion. . She reports her mom was recovering from the similar complaints . She denies diarrhea. She denies sob, or chest pain or abdominal pain. On arrival to ED, she was found dehydrated, hypokalemic and has UTI. She was referred to medical service for admission for management of hypokalemia, and UTI.       Prior Functioning     Home Living Family/patient expects to be discharged to:: Private residence Living Arrangements: Parent Available Help at Discharge: Family Type of Home: House Home Access: Level entry Home Layout: One level Home Equipment: Wheelchair - manual;Hospital bed Additional Comments:  conversion [employer], shower  inaccessible due to design Prior Function Level of Independence: Needs assistance Gait / Transfers Assistance Needed: mother performs squat pivot transfer to [facility] with pt holding around neck ADL's / Homemaking Assistance Needed: Setup for feeding/brushing teeth/washing face, total A otherwise Communication Communication: No difficulties Dominant Hand: Right         Vision/Perception     Cognition  Cognition Arousal/Alertness: Awake/alert Behavior During Therapy: WFL for tasks assessed/performed Overall Cognitive Status: Within Functional Limits for tasks assessed    Extremity/Trunk Assessment Upper Extremity Assessment Upper Extremity Assessment: RUE deficits/detail;LUE deficits/detail RUE Deficits / Details: weakness from MS RUE Coordination: decreased fine motor LUE Deficits / Details: limitations in ROM, weaker than RUE LUE Coordination: decreased fine motor;decreased gross motor Lower Extremity Assessment Lower Extremity Assessment: Defer to PT evaluation RLE Deficits / Details: trace toe movement, increased tone into extension. foot drop present LLE Deficits / Details: increased tone, clonus, foot drop present. Cervical / Trunk Assessment Cervical / Trunk Assessment: Kyphotic (scoliotic)     Mobility Bed Mobility Bed Mobility: Supine to Sit;Sitting - Scoot to [place] of Bed;Sit to Supine Supine to Sit: 2: Max assist Sitting - Scoot to [place] of Bed: 2: Max assist Sit to Supine: 2: Max assist Details for Bed Mobility Assistance: pt is able to assist  10% using L ue to push self up to upright position.     Exercise     Balance Balance Balance Assessed: Yes Static Sitting Balance Static Sitting - Balance Support: Bilateral upper extremity supported Static Sitting - Level of Assistance: 3: Mod assist Static Sitting - Comment/# of Minutes: pt has no trunk righting resposes, uses UE's to shift weight.   End of Session OT - End of Session Activity  Tolerance: Patient tolerated treatment well Patient left: in bed;with call bell/phone within reach;with family/visitor present  GO     Melda Mermelstein A 03/31/2013, 3:34 PM

## 2013-04-01 LAB — URINE CULTURE: Colony Count: >=100000

## 2013-04-01 NOTE — Progress Notes (Signed)
Responded to consult. Mother also in room. Patient and mother talked about their grief over the death of Mr. Landrigan (patient's dad) in October here at [facility] in the ICU. Facilitated and supported their grief process. Listening; presence; prayer per their request.

## 2013-04-01 NOTE — Progress Notes (Signed)
[facility] PROGRESS NOTE  [name] WJX:[id number] DOB: [birth year] DOA: 03/30/2013 PCP: Thora Lance, MD  Brief narrative: 32 y.o. female with past medical history of multiple sclerosis, transverse myelitis, paraplegia, recurrent UTI's and urostomy who presented to [facility] ED 03/30/2013 with fevers, chills, nausea and vomiting for past 24 hours prior to this admission. She reports being exposed to sick contacts at home (with similar symptoms).  In ED, BP was 101/53, HR 105, Tmax 100.1 F and oxygen saturation was 93% on room air. Her CBC revealed leukocytosis of 16 and BMP revealed potassium of 2.9 which was repleted in ED. Renal function was WNL. CXR did not reveal acute cardiopulmonary process. She was found to have UTI and was started on empiric rocephin.   Assessment/Plan:   Principal Problem:  UTI (urinary tract infection)  - continue rocephin IV daily  - urine culture with multiple bacterial morphologies non predominant - has urostomy   Active Problems:  Nausea and vomiting  - secondary to UTI  - continue antiemetics if needed Leukocytosis  - secondary to UTI  - WBC count WNL   Multiple sclerosis  - stable  Hypokalemia  - secondary to GI losses  - repleted in ED - potassium WNL  Functional Paraplegia  - secondary to multiple sclerosis   Code Status: full code  Family Communication: no family at the bedside  Disposition Plan: home when stable   Consultants:  None  Procedures:  None  Antibiotics:  Rocephin 03/30/2013 -->  Manson Passey, MD  [facility] Pager [phone]  If 7PM-7AM, please contact night-coverage [email or web address] Password [facility] 04/01/2013, 5:07 PM   LOS: 2 days    HPI/Subjective: Feels better this am.  Objective: Filed Vitals:   03/31/13 1500 03/31/13 2209 04/01/13 0527 04/01/13 1400  BP: 110/58 109/59 110/72 125/80  Pulse: 107 108 93 92  Temp: 99.1 F (37.3 C) 98.6 F (37 C) 98 F (36.7 C) 98.7 F (37.1 C)  TempSrc:  Oral Oral Oral Oral  Resp: 18 22 20 18   Height:      Weight:      SpO2: 95% 93% 95% 96%    Intake/Output Summary (Last 24 hours) at 04/01/13 1707 Last data filed at 04/01/13 1300  Gross per 24 hour  Intake    650 ml  Output   2675 ml  Net  -2025 ml    Exam:   General:  Pt is alert, follows commands appropriately, not in acute distress  Cardiovascular: Regular rate and rhythm, S1/S2 appreciated  Respiratory: Clear to auscultation bilaterally, no wheezing, no crackles, no rhonchi  Abdomen: Soft, non tender, non distended, bowel sounds present, no guarding  Extremities: paraplegic, pulses DP and PT palpable bilaterally  Neuro: Grossly nonfocal  Data Reviewed: Basic Metabolic Panel:  Recent Labs Lab 03/30/13 0605 03/30/13 1930 03/31/13 0413  NA 135  --  138  K 2.9*  --  4.6  CL 99  --  106  CO2 23  --  21  GLUCOSE 118*  --  98  BUN 10  --  6  CREATININE 0.43*  --  0.41*  CALCIUM 8.7  --  8.5  MG  --  1.9  --    Liver Function Tests: No results found for this basename: AST, ALT, ALKPHOS, BILITOT, PROT, ALBUMIN,  in the last 168 hours No results found for this basename: LIPASE, AMYLASE,  in the last 168 hours No results found for this basename: AMMONIA,  in the last 168 hours CBC:  Recent Labs Lab 03/30/13 0605 03/31/13 0413  WBC 16.4* 10.1  NEUTROABS 14.6*  --   HGB 13.4 12.2  HCT 39.8 35.1*  MCV 82.9 84.0  PLT 210 173   Cardiac Enzymes: No results found for this basename: CKTOTAL, CKMB, CKMBINDEX, TROPONINI,  in the last 168 hours BNP: No components found with this basename: POCBNP,  CBG: No results found for this basename: GLUCAP,  in the last 168 hours  MRSA PCR SCREENING     Status: None   Collection Time    03/30/13 10:02 AM      Result Value Range Status   MRSA by PCR NEGATIVE  NEGATIVE Final  URINE CULTURE     Status: None   Collection Time    03/30/13 11:37 AM      Result Value Range Status   Specimen Description URINE, CLEAN CATCH    Final   Special Requests NONE   Final   Culture  Setup Time     Final   Value: 03/30/2013 19:50     Performed at [facility] Count     Final   Value: >=100,000 COLONIES/ML     Performed at [facility]   Culture     Final   Value: Multiple bacterial morphotypes present, none predominant. Suggest appropriate recollection if clinically indicated.     Performed at [facility]   Report Status 04/01/2013 FINAL   Final     Studies: No results found.  Scheduled Meds: . baclofen  20 mg Oral TID  . bag balm   Topical BID  . cefTRIAXone (ROCEPHIN)  IV  1 g Intravenous Q24H  . diazepam  10 mg Oral QHS  . diazepam  5 mg Oral BID  . enoxaparin (LOVENOX) injection  40 mg Subcutaneous Q24H  . fluticasone  2 spray Each Nare Daily  . gabapentin  800 mg Oral TID  . lamoTRIgine  125 mg Oral Daily  . loratadine  10 mg Oral Daily  . pantoprazole  80 mg Oral Daily  . sertraline  100 mg Oral Daily   Continuous Infusions: . sodium chloride 75 mL/hr at 03/31/13 2222

## 2013-04-02 MED ORDER — LEVOFLOXACIN 750 MG PO TABS: 750.0000 mg | ORAL_TABLET | Freq: Every day | ORAL | Status: DC

## 2013-04-02 MED ORDER — DIAZEPAM 5 MG PO TABS: 5.0000 mg | ORAL_TABLET | Freq: Three times a day (TID) | ORAL | Status: DC

## 2013-04-02 MED ORDER — OXYCODONE-ACETAMINOPHEN 5-325 MG PO TABS: 1.0000 | ORAL_TABLET | ORAL | Status: DC | PRN

## 2013-04-02 NOTE — Discharge Instructions (Signed)
Urinary Tract Infection  Urinary tract infections (UTIs) can develop anywhere along your urinary tract. Your urinary tract is your body's drainage system for removing wastes and extra water. Your urinary tract includes two kidneys, two ureters, a bladder, and a urethra. Your kidneys are a pair of bean-shaped organs. Each kidney is about the size of your fist. They are located below your ribs, one on each side of your spine.  CAUSES  Infections are caused by microbes, which are microscopic organisms, including fungi, viruses, and bacteria. These organisms are so small that they can only be seen through a microscope. Bacteria are the microbes that most commonly cause UTIs.  SYMPTOMS   Symptoms of UTIs may vary by age and gender of the patient and by the location of the infection. Symptoms in young women typically include a frequent and intense urge to urinate and a painful, burning feeling in the bladder or urethra during urination. Older women and men are more likely to be tired, shaky, and weak and have muscle aches and abdominal pain. A fever may mean the infection is in your kidneys. Other symptoms of a kidney infection include pain in your back or sides below the ribs, nausea, and vomiting.  DIAGNOSIS  To diagnose a UTI, your caregiver will ask you about your symptoms. Your caregiver also will ask to provide a urine sample. The urine sample will be tested for bacteria and white blood cells. White blood cells are made by your body to help fight infection.  TREATMENT   Typically, UTIs can be treated with medication. Because most UTIs are caused by a bacterial infection, they usually can be treated with the use of antibiotics. The choice of antibiotic and length of treatment depend on your symptoms and the type of bacteria causing your infection.  HOME CARE INSTRUCTIONS   If you were prescribed antibiotics, take them exactly as your caregiver instructs you. Finish the medication even if you feel better after you  have only taken some of the medication.   Drink enough water and fluids to keep your urine clear or pale yellow.   Avoid caffeine, tea, and carbonated beverages. They tend to irritate your bladder.   Empty your bladder often. Avoid holding urine for long periods of time.   Empty your bladder before and after sexual intercourse.   After a bowel movement, women should cleanse from front to back. Use each tissue only once.  SEEK MEDICAL CARE IF:    You have back pain.   You develop a fever.   Your symptoms do not begin to resolve within 3 days.  SEEK IMMEDIATE MEDICAL CARE IF:    You have severe back pain or lower abdominal pain.   You develop chills.   You have nausea or vomiting.   You have continued burning or discomfort with urination.  MAKE SURE YOU:    Understand these instructions.   Will watch your condition.   Will get help right away if you are not doing well or get worse.  Document Released: 12/27/2004 Document Revised: 09/18/2011 Document Reviewed: 04/27/2011  [employer] Patient Information 2014 [employer]

## 2013-04-02 NOTE — Discharge Summary (Signed)
Physician Discharge Summary  [name]:[id number] DOB: March 23, 1981 DOA: 03/30/2013  PCP: Simona Huh, MD  Admit date: 03/30/2013 Discharge date: 04/02/2013  Recommendations for Outpatient Follow-up:  1. Continue Levaquin for 4 more days on discharge.  2. Follow up with PCP in 1-2 weeks   Discharge Diagnoses:  Principal Problem:   UTI (urinary tract infection) Active Problems:   Multiple sclerosis   Hypokalemia   Influenza   Nausea and vomiting   Paraplegia    Discharge Condition: medically stable for discharge home today   Diet recommendation: as tolerated   History of present illness:  33 y.o. female with past medical history of multiple sclerosis, transverse myelitis, paraplegia, recurrent UTI's and urostomy who presented to [facility] ED 03/30/2013 with fevers, chills, nausea and vomiting for past 24 hours prior to this admission. She reports being exposed to sick contacts at home (with similar symptoms).  In ED, BP was 101/53, HR 105, Tmax 100.1 F and oxygen saturation was 93% on room air. Her CBC revealed leukocytosis of 16 and BMP revealed potassium of 2.9 which was repleted in ED. Renal function was WNL. CXR did not reveal acute cardiopulmonary process. She was found to have UTI and was started on empiric rocephin.   Assessment/Plan:   Principal Problem:  UTI (urinary tract infection)  - continue rocephin IV daily in hospital and Levaquin on discharge  - urine culture with multiple bacterial morphologies non predominant  - has urostomy  Active Problems:  Nausea and vomiting  - secondary to UTI  - continue antiemetics if needed  Leukocytosis  - secondary to UTI  - WBC count WNL  Multiple sclerosis  - stable  Hypokalemia  - secondary to GI losses  - repleted in ED - potassium WNL  Functional Paraplegia  - secondary to multiple sclerosis   Code Status: full code  Family Communication: no family at the bedside  Disposition Plan: home today    Consultants:  None  Procedures:  None  Antibiotics:  Rocephin 03/30/2013 --> changed to Levaquin on discharge  Signed:  Leisa Lenz, MD  [facility] 04/02/2013, 10:33 AM  Pager #: [phone]   Discharge Exam: Filed Vitals:   04/02/13 0615  BP: 119/71  Pulse: 101  Temp: 98.9 F (37.2 C)  Resp: 22   Filed Vitals:   04/01/13 0527 04/01/13 1400 04/01/13 2157 04/02/13 0615  BP: 110/72 125/80 113/71 119/71  Pulse: 93 92 101 101  Temp: 98 F (36.7 C) 98.7 F (37.1 C) 98.6 F (37 C) 98.9 F (37.2 C)  TempSrc: Oral Oral Oral Oral  Resp: 20 18 20 22   Height:      Weight:      SpO2: 95% 96% 94% 94%    General: Pt is alert, follows commands appropriately, not in acute distress Cardiovascular: Regular rate and rhythm, S1/S2 +, no murmurs, no rubs, no gallops Respiratory: Clear to auscultation bilaterally, no wheezing, no crackles, no rhonchi Abdominal: Soft, non tender, non distended, bowel sounds +, no guarding; has urostomy in place  Extremities: paraplegia, pulses palpable Neuro: Grossly nonfocal  Discharge Instructions  Discharge Orders   Future Appointments Provider Department Dept Phone   10/20/2013 11:30 AM Penni Bombard, MD [facility] [phone]   Future Orders Complete By Expires   Call MD for:  difficulty breathing, headache or visual disturbances  As directed    Call MD for:  persistant dizziness or light-headedness  As directed    Call MD for:  persistant nausea  and vomiting  As directed    Call MD for:  severe uncontrolled pain  As directed    Diet - low sodium heart healthy  As directed    Discharge instructions  As directed    Comments:     1. Continue Levaquin for 4 more days on discharge.  2. Follow up with PCP in 1-2 weeks   Increase activity slowly  As directed        Medication List         baclofen 20 MG tablet  Commonly known as:  LIORESAL  Take 20 mg by mouth 3 (three) times daily.     bisacodyl  10 MG suppository  Commonly known as:  DULCOLAX  Place 1 suppository (10 mg total) rectally as needed for constipation.     dextromethorphan 30 MG/5ML liquid  Commonly known as:  DELSYM  Take 30 mg by mouth as needed for cough.     diazepam 5 MG tablet  Commonly known as:  VALIUM  Take 1-2 tablets (5-10 mg total) by mouth every 8 (eight) hours. Take 1 tablet in AM, 1 tablet in PM, and 2 tablets at bedtime     fluticasone 50 MCG/ACT nasal spray  Commonly known as:  FLONASE  Place 2 sprays into the nose daily.     gabapentin 800 MG tablet  Commonly known as:  NEURONTIN  Take 800 mg by mouth 3 (three) times daily.     ibuprofen 200 MG tablet  Commonly known as:  ADVIL,MOTRIN  Take 400 mg by mouth every 6 (six) hours as needed. For pain     lamoTRIgine 25 MG tablet  Commonly known as:  LAMICTAL  Take 125 mg by mouth daily.     levofloxacin 750 MG tablet  Commonly known as:  LEVAQUIN  Take 1 tablet (750 mg total) by mouth daily.     loratadine 10 MG tablet  Commonly known as:  CLARITIN  Take 10 mg by mouth daily.     lubiprostone 8 MCG capsule  Commonly known as:  AMITIZA  Take 8 mcg by mouth 2 (two) times daily with a meal.     multivitamin with minerals Tabs tablet  Take 1 tablet by mouth daily.     omeprazole 40 MG capsule  Commonly known as:  PRILOSEC  Take 40 mg by mouth daily.     ondansetron 8 MG tablet  Commonly known as:  ZOFRAN  Take 8 mg by mouth every 8 (eight) hours as needed. For nausea     oxyCODONE-acetaminophen 5-325 MG per tablet  Commonly known as:  PERCOCET  Take 1 tablet by mouth every 4 (four) hours as needed for severe pain.     rizatriptan 10 MG disintegrating tablet  Commonly known as:  MAXALT-MLT  Take 10 mg by mouth as needed. For migraines; may repeat in 1 hour if needed.     sertraline 100 MG tablet  Commonly known as:  ZOLOFT  Take 100 mg by mouth daily.           Follow-up Information   Follow up with Thora Lance, MD.  Schedule an appointment as soon as possible for a visit in 2 weeks.   Specialty:  Family Medicine   Contact information:   [address], [address] [place] [address] [phone]        The results of significant diagnostics from this hospitalization (including imaging, microbiology, ancillary and laboratory) are listed below for reference.  Significant Diagnostic Studies: Dg Chest 2 View  03/30/2013   CLINICAL DATA:  Vomiting and flu like symptoms.  EXAM: CHEST  2 VIEW  COMPARISON:  03/17/2012  FINDINGS: The heart size and mediastinal contours are within normal limits. Both lungs are clear. The visualized skeletal structures are unremarkable. No significant change since previous study.  IMPRESSION: No active cardiopulmonary disease.   Electronically Signed   By: Lucienne Capers M.D.   On: 03/30/2013 05:50    Microbiology: Recent Results (from the past 240 hour(s))  MRSA PCR SCREENING     Status: None   Collection Time    03/30/13 10:02 AM      Result Value Range Status   MRSA by PCR NEGATIVE  NEGATIVE Final   Comment:            The GeneXpert MRSA Assay (FDA     approved for NASAL specimens     only), is one component of a     comprehensive MRSA colonization     surveillance program. It is not     intended to diagnose MRSA     infection nor to guide or     monitor treatment for     MRSA infections.  URINE CULTURE     Status: None   Collection Time    03/30/13 11:37 AM      Result Value Range Status   Specimen Description URINE, CLEAN CATCH   Final   Special Requests NONE   Final   Culture  Setup Time     Final   Value: 03/30/2013 19:50     Performed at [place]     Final   Value: >=100,000 COLONIES/ML     Performed at [facility]   Culture     Final   Value: Multiple bacterial morphotypes present, none predominant. Suggest appropriate recollection if clinically indicated.     Performed at [facility]   Report  Status 04/01/2013 FINAL   Final     Labs: Basic Metabolic Panel:  Recent Labs Lab 03/30/13 0605 03/30/13 1930 03/31/13 0413  NA 135  --  138  K 2.9*  --  4.6  CL 99  --  106  CO2 23  --  21  GLUCOSE 118*  --  98  BUN 10  --  6  CREATININE 0.43*  --  0.41*  CALCIUM 8.7  --  8.5  MG  --  1.9  --    Liver Function Tests: No results found for this basename: AST, ALT, ALKPHOS, BILITOT, PROT, ALBUMIN,  in the last 168 hours No results found for this basename: LIPASE, AMYLASE,  in the last 168 hours No results found for this basename: AMMONIA,  in the last 168 hours CBC:  Recent Labs Lab 03/30/13 0605 03/31/13 0413  WBC 16.4* 10.1  NEUTROABS 14.6*  --   HGB 13.4 12.2  HCT 39.8 35.1*  MCV 82.9 84.0  PLT 210 173   Cardiac Enzymes: No results found for this basename: CKTOTAL, CKMB, CKMBINDEX, TROPONINI,  in the last 168 hours BNP: BNP (last 3 results) No results found for this basename: PROBNP,  in the last 8760 hours CBG: No results found for this basename: GLUCAP,  in the last 168 hours  Time coordinating discharge: Over 30 minutes

## 2013-04-02 NOTE — Progress Notes (Signed)
Patient and her mother given discharge instructions using teach back method and they demonstrated understanding of all instructions.  Patient stable for discharge home.  Assessment unchanged from this am.  Patient lifted into her wheelchair for discharge home via her mother's van.  [name] [facility]  04/02/2013

## 2013-04-02 NOTE — Progress Notes (Signed)
Physical Therapy Treatment Patient Details Name: [name] MRN: [id number] DOB: July 08, 1980 Today's Date: 04/02/2013 Time: 5462-7035 PT Time Calculation (min): 10 min  PT Assessment / Plan / Recommendation  History of Present Illness [name] is a 33 y.o. female with prior h/o MS, depression, paraplegia, transverse myelitis, was brought for persistent nausea, vomiting, since yesterday associated with chills and low grade fevers. She also reports some nasal congestion. . She reports her mom was recovering from the similar complaints . She denies diarrhea. She denies sob, or chest pain or abdominal pain. On arrival to ED, she was found dehydrated, hypokalemic and has UTI. She was referred to medical service for admission for management of hypokalemia, and UTI.   PT Comments   Mom in room ready to take pt home so assisted pt OOB to her personal WC via stand pivot transfer 1/4 turn from bed.  Once in [facility] pt was able to Indep scoot self back and apply seat buckle.     Follow Up Recommendations  Home health PT;Supervision/Assistance - 24 hour     Does the patient have the potential to tolerate intense rehabilitation     Barriers to Discharge        Equipment Recommendations  None recommended by PT    Recommendations for Other Services    Frequency Min 3X/week   Progress towards PT Goals Progress towards PT goals: Progressing toward goals  Plan      Precautions / Restrictions Precautions Precautions: Fall Precaution Comments: paraplegia, contact precautions, urostomy drain  Restrictions Weight Bearing Restrictions: No    Pertinent Vitals/Pain No c/o pain    Mobility  Bed Mobility Bed Mobility: Supine to Sit;Sitting - Scoot to Edge of Bed;Sit to Supine Supine to Sit: 2: Max assist Sitting - Scoot to [employer] of Bed: 2: Max assist Details for Bed Mobility Assistance: pt is able to assist  10% using L ue to push self up to upright position. Once upright pt was able to sit  EOB at supervision level Transfers Transfers: Stand Pivot Transfers Details for Transfer Assistance: "[name]" stand pivot sit 1/4 turn from bed to pt's personal WC.  Pt was able to partially support self in standing but required increased assist to pivot. Required max assist to control decend.  Once in her wc pt was able to Indep scoot from edge of WC to back using B UE's on hand rests.  Pt was able to Indep apply buckled seat.    PT Goals (current goals can now be found in the care plan section)    Visit Information  Last PT Received On: 04/02/13 Assistance Needed: +1 History of Present Illness: Constance E Styles is a 33 y.o. female with prior h/o MS, depression, paraplegia, transverse myelitis, was brought for persistent nausea, vomiting, since yesterday associated with chills and low grade fevers. She also reports some nasal congestion. . She reports her mom was recovering from the similar complaints . She denies diarrhea. She denies sob, or chest pain or abdominal pain. On arrival to ED, she was found dehydrated, hypokalemic and has UTI. She was referred to medical service for admission for management of hypokalemia, and UTI.    Subjective Data      Cognition       Balance     End of Session PT - End of Session Activity Tolerance: Patient tolerated treatment well Patient left: Other (comment) (in personal WC as she was getting to D/C to hime with mom)  Rica Koyanagi  PTA [facility]  Acute  Rehab Pager      [phone]

## 2013-08-14 ENCOUNTER — Telehealth: Payer: Self-pay | Admitting: Diagnostic Neuroimaging

## 2013-08-14 NOTE — Telephone Encounter (Signed)
Pt calling requesting an order be put in for an motorized wheelchair sent to [facility] in [facility] and pt stated that Dr. Leta Baptist has to specific if chair needs extra straps or reclining foot plates or anything else. Please advise

## 2013-08-14 NOTE — Telephone Encounter (Signed)
Patient calling again to state that she wants the order for motorized wheelchair to be sent to [facility] door. Please call and advise.

## 2013-08-14 NOTE — Telephone Encounter (Signed)
Patient calling requesting an order sent to [facility] in [facility] for a new motorized wheelchair.  Dr Leta Baptist needs to indicate if extra straps are needed, reclining foot plates, or any other specifics that he feels would be needed.  Thanks

## 2013-08-14 NOTE — Telephone Encounter (Signed)
pls get more information about nature of order and requirements from [facility]/PT. Then draft order for my review. -VRP

## 2013-08-19 NOTE — Telephone Encounter (Signed)
Called pt to inform her that pt's information was faxed over to [facility], Attn: Debbie in [place], and that [facility] stated that they would take care of this matter. I advised the pt that if she has any other problems, questions or concerns to call the office. Pt verbalized understanding.

## 2013-08-20 ENCOUNTER — Ambulatory Visit: Payer: Medicaid Other | Attending: Diagnostic Neuroimaging | Admitting: Physical Therapy

## 2013-08-20 DIAGNOSIS — G0489 Other myelitis: Secondary | ICD-10-CM | POA: Diagnosis not present

## 2013-08-20 DIAGNOSIS — M6281 Muscle weakness (generalized): Secondary | ICD-10-CM | POA: Diagnosis not present

## 2013-08-20 DIAGNOSIS — IMO0001 Reserved for inherently not codable concepts without codable children: Secondary | ICD-10-CM | POA: Insufficient documentation

## 2013-09-02 ENCOUNTER — Telehealth: Payer: Self-pay | Admitting: Diagnostic Neuroimaging

## 2013-09-02 NOTE — Telephone Encounter (Signed)
[name] with [facility] requesting an appointment with Dr Colen Darling for face to face for [employer] due to [employer].  He's aware of 7/21 appointment, but would like one before then.  Please call and advise. [phone]

## 2013-09-04 NOTE — Telephone Encounter (Signed)
I called and LMVM for [name] with [facility] re: earlier appt for face to face appt needed for power wheelchair.  Possible 1330 09-07-13 with Dr. Leta Baptist.

## 2013-09-04 NOTE — Telephone Encounter (Signed)
Tonya with [facility], returned Sandy's call and stated 09/07/13 @ 13:30 they would not be available at that time. [phone]

## 2013-09-07 NOTE — Telephone Encounter (Signed)
LMVM for appt for pt.  Josh from [facility] to return call re: appt for pt.

## 2013-09-07 NOTE — Telephone Encounter (Signed)
Spoke with Kenney Houseman, [facility] and appt made for pt 09-21-13 at 1030.

## 2013-09-21 ENCOUNTER — Ambulatory Visit (INDEPENDENT_AMBULATORY_CARE_PROVIDER_SITE_OTHER): Payer: Medicaid Other | Admitting: Diagnostic Neuroimaging

## 2013-09-21 ENCOUNTER — Encounter (INDEPENDENT_AMBULATORY_CARE_PROVIDER_SITE_OTHER): Payer: Self-pay

## 2013-09-21 ENCOUNTER — Encounter: Payer: Self-pay | Admitting: Diagnostic Neuroimaging

## 2013-09-21 VITALS — BP 96/66 | HR 97

## 2013-09-21 DIAGNOSIS — G0489 Other myelitis: Secondary | ICD-10-CM

## 2013-09-21 DIAGNOSIS — G373 Acute transverse myelitis in demyelinating disease of central nervous system: Secondary | ICD-10-CM

## 2013-09-21 NOTE — Progress Notes (Signed)
[facility]  PATIENT: [name] DOB: [birth year]  REFERRING CLINICIAN:  HISTORY FROM: patient and representative from [place] VISIT: follow up   [place]:  Chief Complaint  Patient presents with  . Follow-up    RM 7  . Transverse myelitis    power wheelchair evaluation    HISTORY OF PRESENT ILLNESS:   UPDATE 09/21/13: Since last visit, doing well. Here for face-face eval for power wheelchair. Has completed PT evaluation. Spasticity is stable. Pain is stable.   UPDATE 10/20/12: stable, but here to request wheelchair replacement evaluation and rx. Her chair is approx 33 years old and wearing out. Hoping to get an upgraded model that is safer (she get stuck in the straping due to spasms, and wheels are wearing out).  UPDATE 12/17/11: No new symptoms. still on diazepam, baclofen and gabapentin. stable dosing for several years now.   UPDATE 12/25/10: complains of more fatigue.  also with facial rash for last few weeks.  otherwise muscle spasms and weakness are unchanged.  toelrating gabapentin, diazepam, and baclofen.  UPDATE 05/24/10: Doing very well, since her last visit, when her intake of gabapentin was increased to 800 mg tid. Since then she did not have as much muscle spasm between the intake of her medication as she had before.She also states that she did not have to take any antibiotics in the last 6 month, which also contribute to her feeling much better. The patient also reports that her mood has improved, because now she has a more active social life, she also has a boy friend right now.  The patient is asking for a refill of Zomig for her seldom occuring migraines, which we will order.  UPDATE 02/01/10: 33 year old female with a history of transverse myelitis (C4-7) in 2005, previously followed by Dr. Doy Mince.  Doing well since last visit (08/11/09).  Having some more muscle spasm / clonus in feet / calves.    PRIOR HPI:  Patient returns today for followup. She has been followed in this office since February 2005, when she suffered acute cervical transverse myelitis involving C4-C7, with resultant spastic paraplegia, left greater than right upper extremity weakness, and urinary dysfunction. She had a presumed syncopal spell in the summer of 2009, for which MRI and EEG were unrevealing. She has flexor spasms, for which she takes baclofen, Neurontin, and Valium. She has seen a psychiatrist who has diagnosed her with major depression and PTSD. Her psychiatrist placed her on Lamictal, which has been helpful for her mood. I last saw her in November.  REVIEW OF SYSTEMS: Full 14 system review of systems performed and notable only for headache depression anxiety constipation incontinence allergies leg swelling runny nose.   ALLERGIES: Allergies  Allergen Reactions  . Other Anaphylaxis    ALL -MYCINS  . Aspirin Other (See Comments)    Reaction unknown  . Iodine Other (See Comments)    Reaction unknown  . Latex Other (See Comments)    Reaction unknown  . Shellfish-Derived Products Other (See Comments)    Reaction unknown  . Strawberry   . Vancomycin Other (See Comments)    Reaction unknown    HOME MEDICATIONS: Outpatient Prescriptions Prior to Visit  Medication Sig Dispense Refill  . baclofen (LIORESAL) 20 MG tablet Take 20 mg by mouth 3 (three) times daily.      . bisacodyl (DULCOLAX) 10 MG suppository Place 1 suppository (10 mg total) rectally as needed for constipation.  12 suppository  0  . dextromethorphan (DELSYM) 30 MG/5ML liquid Take 30 mg by mouth as needed for cough.      . diazepam (VALIUM) 5 MG tablet Take 1-2 tablets (5-10 mg total) by mouth every 8 (eight) hours. Take 1 tablet in AM, 1 tablet in PM, and 2 tablets at bedtime  30 tablet  0  . fluticasone (FLONASE) 50 MCG/ACT nasal spray Place 2 sprays into the nose daily.      Marland Kitchen gabapentin (NEURONTIN) 800 MG tablet Take 800 mg by mouth 3 (three) times  daily.      Marland Kitchen ibuprofen (ADVIL,MOTRIN) 200 MG tablet Take 400 mg by mouth every 6 (six) hours as needed. For pain      . lamoTRIgine (LAMICTAL) 25 MG tablet Take 125 mg by mouth daily.      Marland Kitchen levofloxacin (LEVAQUIN) 750 MG tablet Take 1 tablet (750 mg total) by mouth daily.  4 tablet  0  . loratadine (CLARITIN) 10 MG tablet Take 10 mg by mouth daily.      Marland Kitchen lubiprostone (AMITIZA) 8 MCG capsule Take 8 mcg by mouth 2 (two) times daily with a meal.      . Multiple Vitamin (MULITIVITAMIN WITH MINERALS) TABS Take 1 tablet by mouth daily.      Marland Kitchen omeprazole (PRILOSEC) 40 MG capsule Take 40 mg by mouth daily.      . ondansetron (ZOFRAN) 8 MG tablet Take 8 mg by mouth every 8 (eight) hours as needed. For nausea      . oxyCODONE-acetaminophen (PERCOCET) 5-325 MG per tablet Take 1 tablet by mouth every 4 (four) hours as needed for severe pain.  30 tablet  0  . rizatriptan (MAXALT-MLT) 10 MG disintegrating tablet Take 10 mg by mouth as needed. For migraines; may repeat in 1 hour if needed.      . sertraline (ZOLOFT) 100 MG tablet Take 100 mg by mouth daily.       No facility-administered medications prior to visit.    PAST MEDICAL HISTORY: Past Medical History  Diagnosis Date  . Multiple sclerosis   . Depression   . PTSD (post-traumatic stress disorder)   . Paraplegia   . Transverse myelitis   . Bladder stones     Hx of  . GERD (gastroesophageal reflux disease)     PAST SURGICAL HISTORY: Past Surgical History  Procedure Laterality Date  . Tonsillectomy    . Cholecystectomy    . Cystectomy w/ ureteroileal conduit    . Wisdom tooth extraction      FAMILY HISTORY: No family history on file.  SOCIAL HISTORY:  History   Social History  . Marital Status: Single    Spouse Name: N/A    Number of Children: N/A  . Years of Education: N/A   Occupational History  . Not on file.   Social History Main Topics  . Smoking status: Never Smoker   . Smokeless tobacco: Never Used  . Alcohol  Use: No  . Drug Use: No  . Sexual Activity: No   Other Topics Concern  . Not on file   Social History Narrative   Lives with parents.  All disabled.  Attended [employer] until she got transverse myelitis.     PHYSICAL EXAM  Filed Vitals:   09/21/13 1013  BP: 96/66  Pulse: 97    Not recorded    Cannot calculate BMI with a height equal to zero.  GENERAL EXAM: General: Patient is well developed and well groomed.  Patient is awake and alert and in no acute distress. Cardiovascular: Regular rate and rhythm with no murmurs. No carotid bruit.  Neurologic Exam  Mental Status: Awake, alert. Language is fluent and comprehensive. Cranial Nerves: Pupils are equal and round and reactive to light. Conjugate eye movements are full and symmetric. Visual fields are full to confrontation. No evidence of papilledema or other changes on funduscopy. Facial sensation and facial muscle strength are symmetric and in normal limits. Hearing is intact. Palate elevated symmetrically and uvula is midline. Shoulder shrug is symmetric. Tongue is midline. Motor: INCREASED TONE IN LUE > RUE. BUE PROX 4/5; LEFT HAND GRIP, FINGER ABDUCTION 2/5; RIGHT HAND 4/5.  MOTOR TONE INCREASED IN BLE (0/5). Sensory: Intact sensation to light touch, DECREASED SENSATION ON THE LEFT ARM AND LEFT LEG Gait and Station: WHEELCHAIR BOUND; LEANING TO LEFT SIDE Reflexes: BUE 3, KNEES 3, ANKLES 2.    DIAGNOSTIC DATA (LABS, IMAGING, TESTING) - I reviewed patient records, labs, notes, testing and imaging myself where available.  Lab Results  Component Value Date   WBC 10.1 03/31/2013   HGB 12.2 03/31/2013   HCT 35.1* 03/31/2013   MCV 84.0 03/31/2013   PLT 173 03/31/2013      Component Value Date/Time   NA 138 03/31/2013 0413   K 4.6 03/31/2013 0413   CL 106 03/31/2013 0413   CO2 21 03/31/2013 0413   GLUCOSE 98 03/31/2013 0413   BUN 6 03/31/2013 0413   CREATININE 0.41* 03/31/2013 0413   CALCIUM 8.5 03/31/2013 0413   PROT 7.0  01/10/2012 0055   ALBUMIN 3.9 01/10/2012 0055   AST 15 01/10/2012 0055   ALT 14 01/10/2012 0055   ALKPHOS 96 01/10/2012 0055   BILITOT 0.2* 01/10/2012 0055   GFRNONAA >90 03/31/2013 0413   GFRAA >90 03/31/2013 0413   No results found for this basename: CHOL,  HDL,  LDLCALC,  LDLDIRECT,  TRIG,  CHOLHDL   No results found for this basename: HGBA1C   Lab Results  Component Value Date   VITAMINB12 855 11/04/2007   No results found for this basename: TSH     ASSESSMENT AND PLAN  33 y.o.  female with history of C4-C7 transverse myelitis in Feb 2005.  No active ongoing treatments for transverse myelitis. Symptom management with diazepam, baclofen and gabepentin. Overall stable.  Patient presents today for power wheelchair evaluation. I reviewed and concur with date physical therapy evaluation and recommendations. Patient needs tilt and recline feature to help her shift weight. Patient is willing and motivated to use power wheelchair.  PLAN: 1. continue meds 2. POWER WHEELCHAIR  EVALUATION COMPLETED TODAY AND FORMS SIGNED  Return in about 1 year (around 09/22/2014).    Penni Bombard, MD 4/62/7035, 00:93 AM Certified in Neurology, Neurophysiology and Neuroimaging  [facility] [address], [place], [place] [address] [phone]

## 2013-10-20 ENCOUNTER — Ambulatory Visit: Payer: Medicaid Other | Admitting: Diagnostic Neuroimaging

## 2013-10-23 ENCOUNTER — Telehealth: Payer: Self-pay | Admitting: *Deleted

## 2013-10-23 NOTE — Telephone Encounter (Signed)
Beth from [facility] calling re: fax sent for face to face for [facility] from Dr. Leta Baptist.   Explained out to office.  Will be back on Monday.

## 2013-10-23 NOTE — Telephone Encounter (Signed)
If face to face appt needed, then pls setup appt. -VRP

## 2013-10-26 NOTE — Telephone Encounter (Signed)
I faxed to [facility] the last ofv note relating to power wheelchair.  (face to face).

## 2013-10-30 ENCOUNTER — Telehealth: Payer: Self-pay | Admitting: Diagnostic Neuroimaging

## 2013-10-30 NOTE — Telephone Encounter (Signed)
Due to decreased mobility related to transverse myelitis. -VRP

## 2013-10-30 NOTE — Telephone Encounter (Signed)
Called lee with [facility] and lee stated that Dr. Leta Baptist signed off on an order for a hosp bed on the 10/23/13, but still need documentation stating why pt needs hospital bed. Please advise

## 2013-10-30 NOTE — Telephone Encounter (Signed)
Fwd to Dr. Leta Baptist.

## 2013-10-30 NOTE — Telephone Encounter (Signed)
[name] with [facility] @ [phone] x [phone], need clinical documentation to support need for Hospital bed.  Please call and advise, if leaving message please leave pt's name.  Thanks

## 2013-11-02 NOTE — Telephone Encounter (Signed)
Called and left message for [facility] explaining Dr. Gladstone Lighter previous note.

## 2013-11-05 NOTE — Telephone Encounter (Signed)
Noted  

## 2013-11-05 NOTE — Telephone Encounter (Signed)
Beth from [facility] calling to state that they need supporting documents specifically stating why patient needs a hospital bed, please return call and advise, she can be reached at [phone] ext [phone].

## 2013-11-06 NOTE — Telephone Encounter (Addendum)
Spoke to [place] @ [place]. She will fax over template for hospital bed request to fax# [phone]. Called patient, left vmail hospital bed request is still processing.

## 2013-11-19 ENCOUNTER — Telehealth: Payer: Self-pay | Admitting: Diagnostic Neuroimaging

## 2013-11-19 NOTE — Telephone Encounter (Signed)
Patient stated [facility] informed her to make sure Dr. Leta Baptist specifies Electronic hospital bed on order.  Please call and advise.

## 2013-11-20 NOTE — Telephone Encounter (Signed)
Patient need you to make sure order specifies Electronic hospital bed for Medicaid purposes.

## 2013-11-26 NOTE — Telephone Encounter (Signed)
I don't have order. Please follow up. -VRP

## 2013-11-27 ENCOUNTER — Other Ambulatory Visit: Payer: Self-pay | Admitting: Diagnostic Neuroimaging

## 2013-11-27 DIAGNOSIS — G373 Acute transverse myelitis in demyelinating disease of central nervous system: Secondary | ICD-10-CM

## 2013-11-27 NOTE — Telephone Encounter (Signed)
Spoke to [place] @ [facility]. She recommended I fax DME hospital electric bed order and last office visit notes directly to her @ [phone]. Called patient and gave update. She was grateful.

## 2014-02-13 ENCOUNTER — Emergency Department (HOSPITAL_COMMUNITY)
Admission: EM | Admit: 2014-02-13 | Discharge: 2014-02-14 | Disposition: A | Discharge status: Home / Self Care | Payer: Medicaid Other | Attending: Emergency Medicine | Admitting: Emergency Medicine

## 2014-02-13 ENCOUNTER — Emergency Department (HOSPITAL_COMMUNITY): Payer: Medicaid Other

## 2014-02-13 ENCOUNTER — Encounter (HOSPITAL_COMMUNITY): Payer: Self-pay | Admitting: Emergency Medicine

## 2014-02-13 DIAGNOSIS — W231XXA Caught, crushed, jammed, or pinched between stationary objects, initial encounter: Secondary | ICD-10-CM | POA: Diagnosis not present

## 2014-02-13 DIAGNOSIS — Z8669 Personal history of other diseases of the nervous system and sense organs: Secondary | ICD-10-CM | POA: Insufficient documentation

## 2014-02-13 DIAGNOSIS — K219 Gastro-esophageal reflux disease without esophagitis: Secondary | ICD-10-CM | POA: Insufficient documentation

## 2014-02-13 DIAGNOSIS — Z7951 Long term (current) use of inhaled steroids: Secondary | ICD-10-CM | POA: Insufficient documentation

## 2014-02-13 DIAGNOSIS — Z8742 Personal history of other diseases of the female genital tract: Secondary | ICD-10-CM | POA: Insufficient documentation

## 2014-02-13 DIAGNOSIS — M25539 Pain in unspecified wrist: Secondary | ICD-10-CM

## 2014-02-13 DIAGNOSIS — Y92009 Unspecified place in unspecified non-institutional (private) residence as the place of occurrence of the external cause: Secondary | ICD-10-CM | POA: Insufficient documentation

## 2014-02-13 DIAGNOSIS — Z91040 Latex allergy status: Secondary | ICD-10-CM | POA: Insufficient documentation

## 2014-02-13 DIAGNOSIS — Z79899 Other long term (current) drug therapy: Secondary | ICD-10-CM | POA: Diagnosis not present

## 2014-02-13 DIAGNOSIS — Z8659 Personal history of other mental and behavioral disorders: Secondary | ICD-10-CM | POA: Insufficient documentation

## 2014-02-13 DIAGNOSIS — S6992XA Unspecified injury of left wrist, hand and finger(s), initial encounter: Secondary | ICD-10-CM | POA: Diagnosis not present

## 2014-02-13 DIAGNOSIS — Y9389 Activity, other specified: Secondary | ICD-10-CM | POA: Insufficient documentation

## 2014-02-13 DIAGNOSIS — Y998 Other external cause status: Secondary | ICD-10-CM | POA: Diagnosis not present

## 2014-02-13 DIAGNOSIS — Z792 Long term (current) use of antibiotics: Secondary | ICD-10-CM | POA: Diagnosis not present

## 2014-02-13 DIAGNOSIS — M25532 Pain in left wrist: Secondary | ICD-10-CM

## 2014-02-13 NOTE — ED Notes (Signed)
Pt arrived to the ED with a complaint of left arm pain.  Pt was in a hospital bed when she got her hand caught in a hand rail.  Pt had difficulty getting her hand and arm out.  Pt states states pain began near the shoulder and as the day has gone on the pain has progressed downward and has increased in pain with the movement.

## 2014-02-14 ENCOUNTER — Emergency Department (HOSPITAL_COMMUNITY): Payer: Medicaid Other

## 2014-02-14 MED ORDER — HYDROCODONE-ACETAMINOPHEN 5-325 MG PO TABS
2.0000 | ORAL_TABLET | Freq: Once | ORAL | Status: AC
  Administered 2014-02-14: 2 via ORAL
  Filled 2014-02-14: qty 2

## 2014-02-14 NOTE — ED Provider Notes (Signed)
CSN: [id number]     Arrival date & time 02/13/14  2312 History   First MD Initiated Contact with Patient 02/14/14 0409     Chief Complaint  Patient presents with  . Arm Pain     (Consider location/radiation/quality/duration/timing/severity/associated sxs/prior Treatment) HPI  [name] is a 33 y.o. female with past medical history of multiple sclerosis, depression, transverse myelitis, GERD presenting today with arm pain. Patient states she woke up this morning in her hospital bed at home. Her arm was caught in between the rails. Her mother who accompanies her in the room attempted to maneuver her arm out. She now has pain diffusely throughout her left upper extremity. Patient has decreased sensation at baseline on the left side and thus does not know how significant her pain may be. It started in her left shoulder has progressed down to her wrist. Her most severe pain currently is at the wrist. She has not taken any pain medication for this today.  10 Systems reviewed and are negative for acute change except as noted in the HPI.     Past Medical History  Diagnosis Date  . Multiple sclerosis   . Depression   . PTSD (post-traumatic stress disorder)   . Paraplegia   . Transverse myelitis   . Bladder stones     Hx of  . GERD (gastroesophageal reflux disease)    Past Surgical History  Procedure Laterality Date  . Tonsillectomy    . Cholecystectomy    . Cystectomy w/ ureteroileal conduit    . Wisdom tooth extraction     History reviewed. No pertinent family history. History  Substance Use Topics  . Smoking status: Never Smoker   . Smokeless tobacco: Never Used  . Alcohol Use: No   OB History    No data available     Review of Systems    Allergies  Other; Aspirin; Iodine; Latex; Shellfish-derived products; Strawberry; and Vancomycin  Home Medications   Prior to Admission medications   Medication Sig Start Date End Date Taking? Authorizing Provider  baclofen  (LIORESAL) 20 MG tablet Take 20 mg by mouth 3 (three) times daily.   Yes Historical Provider, MD  bisacodyl (BISACODYL) 5 MG EC tablet Take 5 mg by mouth daily as needed for moderate constipation (for constipation).   Yes Historical Provider, MD  dextromethorphan (DELSYM) 30 MG/5ML liquid Take 30 mg by mouth as needed for cough.   Yes Historical Provider, MD  diazepam (VALIUM) 5 MG tablet Take 1-2 tablets (5-10 mg total) by mouth every 8 (eight) hours. Take 1 tablet in AM, 1 tablet in PM, and 2 tablets at bedtime 04/02/13  Yes Robbie Lis, MD  fluticasone [facility]) 50 MCG/ACT nasal spray Place 2 sprays into the nose daily.   Yes Historical Provider, MD  gabapentin (NEURONTIN) 400 MG capsule Take 800 mg by mouth 3 (three) times daily. 01/15/14  Yes Historical Provider, MD  gabapentin (NEURONTIN) 800 MG tablet Take 800 mg by mouth 3 (three) times daily.   Yes Historical Provider, MD  ibuprofen (ADVIL,MOTRIN) 200 MG tablet Take 400 mg by mouth every 6 (six) hours as needed. For pain   Yes Historical Provider, MD  lamoTRIgine (LAMICTAL) 25 MG tablet Take 125 mg by mouth daily.   Yes Historical Provider, MD  loratadine (CLARITIN) 10 MG tablet Take 10 mg by mouth daily.   Yes Historical Provider, MD  lubiprostone (AMITIZA) 8 MCG capsule Take 8 mcg by mouth at bedtime as needed (when  she has not had a bowel movement in 3 days).    Yes Historical Provider, MD  omeprazole (PRILOSEC) 40 MG capsule Take 40 mg by mouth daily.   Yes Historical Provider, MD  ondansetron (ZOFRAN) 8 MG tablet Take 8 mg by mouth every 8 (eight) hours as needed. For nausea   Yes Historical Provider, MD  rizatriptan (MAXALT-MLT) 10 MG disintegrating tablet Take 10 mg by mouth as needed. For migraines; may repeat in 1 hour if needed.   Yes Historical Provider, MD  sertraline (ZOLOFT) 100 MG tablet Take 100 mg by mouth daily.   Yes Historical Provider, MD  levofloxacin (LEVAQUIN) 750 MG tablet Take 1 tablet (750 mg total) by mouth daily.  04/02/13   Robbie Lis, MD  oxyCODONE-acetaminophen (PERCOCET) 5-325 MG per tablet Take 1 tablet by mouth every 4 (four) hours as needed for severe pain. 04/02/13   Robbie Lis, MD   BP 114/89 mmHg  Pulse 61  Temp(Src) 97.7 F (36.5 C) (Oral)  Resp 16  SpO2 100%  LMP 01/13/2014 Physical Exam  Constitutional: She is oriented to person, place, and time. No distress.  HENT:  Nose: Nose normal.  Mouth/Throat: Oropharynx is clear and moist. No oropharyngeal exudate.  Eyes: Conjunctivae and EOM are normal. Pupils are equal, round, and reactive to light. No scleral icterus.  Neck: No JVD present. No tracheal deviation present. No thyromegaly present.  Cardiovascular: Normal rate, regular rhythm and normal heart sounds.  Exam reveals no gallop and no friction rub.   No murmur heard. Pulmonary/Chest: Effort normal and breath sounds normal. No respiratory distress. She has no wheezes. She exhibits no tenderness.  Abdominal: Soft. Bowel sounds are normal. She exhibits no distension and no mass. There is no tenderness. There is no rebound and no guarding.  Musculoskeletal: She exhibits no edema or tenderness.  Limited range of motion due to patient's severe MS. No obvious swelling or deformity noted. No tenderness to palpation. 2+ pulses distally. Sensation is abnormal which is her baseline.  Lymphadenopathy:    She has no cervical adenopathy.  Neurological: She is alert and oriented to person, place, and time. No cranial nerve deficit. She exhibits abnormal muscle tone.  Patient is chronically constricted in her extremities.  Skin: Skin is warm and dry. No rash noted. She is not diaphoretic. No erythema. No pallor.  Nursing note and vitals reviewed.   ED Course  Procedures (including critical care time) Labs Review Labs Reviewed - No data to display  Imaging Review Dg Elbow Complete Left  02/14/2014   CLINICAL DATA:  Injury left upper extremity 02/13/2014 initial evaluation, left elbow  pain. Pt injured left upper extremity 02/13/14 in the morning; she has multiple sclerosis and got her left arm caught in the railings of her bed; left posterior elbow joint pain  EXAM: LEFT ELBOW - COMPLETE 3+ VIEW  COMPARISON:  None.  FINDINGS: There is no evidence of fracture, dislocation, or joint effusion. There is no evidence of arthropathy or other focal bone abnormality. Soft tissues are unremarkable.  IMPRESSION: Negative.   Electronically Signed   By: Skipper Cliche M.D.   On: 02/14/2014 01:14   Dg Wrist Complete Left  02/14/2014   CLINICAL DATA:  Initial evaluationPt injured left upper extremity 02/13/14 in the morning; she has multiple sclerosis and got her left arm caught in the railings of her bed; generalized left wrist pain; difficulty with deviation of wrist  EXAM: LEFT WRIST - COMPLETE 3+ VIEW  COMPARISON:  None.  FINDINGS: Thin crest above along the dorsal aspect of the rests. This may represent normal overlapping cortex. The possibility of a triquetrum fracture is not excluded. There are no other focal abnormalities.  IMPRESSION: Cannot exclude fracture of the triquetrum. Correlate clinically. Oblique views could be performed if indicated, versus CT of the hand/wrist.   Electronically Signed   By: Skipper Cliche M.D.   On: 02/14/2014 01:20   Ct Wrist Left Wo Contrast  02/14/2014   CLINICAL DATA:  Left wrist pain  EXAM: CT OF THE LEFT WRIST WITHOUT CONTRAST  TECHNIQUE: Multidetector CT imaging was performed according to the standard protocol. Multiplanar CT image reconstructions were also generated.  COMPARISON:  None.  FINDINGS: There is no fracture or dislocation. The joint spaces are maintained. There are no erosive changes. There is no lytic or sclerotic osseous lesion. The soft tissues are normal.  IMPRESSION: No acute osseous injury of the left wrist.   Electronically Signed   By: Kathreen Devoid   On: 02/14/2014 06:45   Dg Shoulder Left  02/14/2014   CLINICAL DATA:  Injury left  upper extremity 02/13/2014, caught left arm caught in the railing of her bed, left shoulder pain and proximal humerus pain, initial evaluation  EXAM: LEFT SHOULDER - 2+ VIEW  COMPARISON:  None.  FINDINGS: There is no evidence of fracture or dislocation. There is no evidence of arthropathy or other focal bone abnormality. Soft tissues are unremarkable.  IMPRESSION: Negative.   Electronically Signed   By: Skipper Cliche M.D.   On: 02/14/2014 01:13     EKG Interpretation None      MDM   Final diagnoses:  Wrist pain    Patient since emergency department out of concern for arm pain. X-rays on her but suggest possible triquetral fracture. Patient does have tenderness in the hand and states that the wrist is the worst. Will obtain CT of upper extremity due to decreased sensation in unreliable physical exam. She was given Norco for pain relief.  CT is negative for any injury.  She was advised to continue motrin that she takes at home for relief and follow up with PCP within 3 days.  Her vital signs remain within her normal limits and she is safe for DC.    Everlene Balls, MD 02/14/14 [phone]

## 2014-02-14 NOTE — Discharge Instructions (Signed)
Arthralgia Ms. Kropp, you were seen today for pain in your left arm. CT scan did not show any fractures. Follow-up with your primary care physician within 3 days for continued treatment. If any symptoms worsen come back to emergency department immediately for repeat evaluation. Thank you.  Arthralgia is joint pain. A joint is a place where two bones meet. Joint pain can happen for many reasons. The joint can be bruised, stiff, infected, or weak from aging. Pain usually goes away after resting and taking medicine for soreness.  HOME CARE  Rest the joint as told by your doctor.  Keep the sore joint raised (elevated) for the first 24 hours.  Put ice on the joint area.  Put ice in a plastic bag.  Place a towel between your skin and the bag.  Leave the ice on for 15-20 minutes, 03-04 times a day.  Wear your splint, casting, elastic bandage, or sling as told by your doctor.  Only take medicine as told by your doctor. Do not take aspirin.  Use crutches as told by your doctor. Do not put weight on the joint until told to by your doctor. GET HELP RIGHT AWAY IF:   You have bruising, puffiness (swelling), or more pain.  Your fingers or toes turn blue or start to lose feeling (numb).  Your medicine does not lessen the pain.  Your pain becomes severe.  You have a temperature by mouth above 102 F (38.9 C), not controlled by medicine.  You cannot move or use the joint. MAKE SURE YOU:   Understand these instructions.  Will watch your condition.  Will get help right away if you are not doing well or get worse. Document Released: 03/07/2009 Document Revised: 06/11/2011 Document Reviewed: 03/07/2009 [facility] Patient Information 2015 [place], [place]. This information is not intended to replace advice given to you by your health care provider. Make sure you discuss any questions you have with your health care provider.

## 2014-09-13 ENCOUNTER — Encounter (HOSPITAL_COMMUNITY): Payer: Self-pay | Admitting: Emergency Medicine

## 2014-09-13 ENCOUNTER — Emergency Department (HOSPITAL_COMMUNITY)
Admission: EM | Admit: 2014-09-13 | Discharge: 2014-09-13 | Disposition: A | Discharge status: Home / Self Care | Payer: Medicaid Other | Attending: Emergency Medicine | Admitting: Emergency Medicine

## 2014-09-13 ENCOUNTER — Emergency Department (HOSPITAL_COMMUNITY): Payer: Medicaid Other

## 2014-09-13 DIAGNOSIS — Z8739 Personal history of other diseases of the musculoskeletal system and connective tissue: Secondary | ICD-10-CM | POA: Diagnosis not present

## 2014-09-13 DIAGNOSIS — F329 Major depressive disorder, single episode, unspecified: Secondary | ICD-10-CM | POA: Insufficient documentation

## 2014-09-13 DIAGNOSIS — K219 Gastro-esophageal reflux disease without esophagitis: Secondary | ICD-10-CM | POA: Diagnosis not present

## 2014-09-13 DIAGNOSIS — R0789 Other chest pain: Secondary | ICD-10-CM | POA: Insufficient documentation

## 2014-09-13 DIAGNOSIS — Z8669 Personal history of other diseases of the nervous system and sense organs: Secondary | ICD-10-CM | POA: Diagnosis not present

## 2014-09-13 DIAGNOSIS — N898 Other specified noninflammatory disorders of vagina: Secondary | ICD-10-CM | POA: Diagnosis not present

## 2014-09-13 DIAGNOSIS — F4310 Post-traumatic stress disorder, unspecified: Secondary | ICD-10-CM | POA: Diagnosis not present

## 2014-09-13 DIAGNOSIS — Z008 Encounter for other general examination: Secondary | ICD-10-CM

## 2014-09-13 DIAGNOSIS — B3789 Other sites of candidiasis: Secondary | ICD-10-CM | POA: Diagnosis not present

## 2014-09-13 DIAGNOSIS — Z8742 Personal history of other diseases of the female genital tract: Secondary | ICD-10-CM | POA: Insufficient documentation

## 2014-09-13 DIAGNOSIS — Z91040 Latex allergy status: Secondary | ICD-10-CM | POA: Diagnosis not present

## 2014-09-13 DIAGNOSIS — Z0000 Encounter for general adult medical examination without abnormal findings: Secondary | ICD-10-CM | POA: Insufficient documentation

## 2014-09-13 DIAGNOSIS — Z79899 Other long term (current) drug therapy: Secondary | ICD-10-CM | POA: Insufficient documentation

## 2014-09-13 DIAGNOSIS — Z7951 Long term (current) use of inhaled steroids: Secondary | ICD-10-CM | POA: Insufficient documentation

## 2014-09-13 DIAGNOSIS — N63 Unspecified lump in breast: Secondary | ICD-10-CM | POA: Diagnosis present

## 2014-09-13 MED ORDER — HYDROCODONE-ACETAMINOPHEN 5-325 MG PO TABS: 1.0000 | ORAL_TABLET | Freq: Four times a day (QID) | ORAL | Status: DC | PRN

## 2014-09-13 NOTE — ED Notes (Signed)
Pt states home health nurse came in and noticed a mass under left arm in rib cage area, was not there over the weekend. Pt also state she is having a thinker and heavier d/c from vagina. Pt just came off of cycle last week.

## 2014-09-13 NOTE — Discharge Instructions (Signed)

## 2014-09-13 NOTE — ED Provider Notes (Signed)
CSN: [id number]     Arrival date & time 09/13/14  1602 History   First MD Initiated Contact with Patient 09/13/14 1725     Chief Complaint  Patient presents with  . Mass  . Vaginal Discharge     (Consider location/radiation/quality/duration/timing/severity/associated sxs/prior Treatment) HPI Comments: Patient with past medical history of multiple sclerosis, depression, PTSD, paraplegia, transverse myelitis presents to the emergency department with chief complaint of breast mass. She states that she noticed a firm area on her chest a day or so ago. She states that she has some associated pain with deep inspiration. She also has a rash near the area. The pain is worsened with palpation. She denies any associated fevers, chills, nausea, or vomiting. She denies any discharge. Of note, she states that she has had thickened vaginal discharge, but denies any odor, pain, itching, or other associated symptoms. She states that she would not like to have this investigated today, but wanted to be certain that the symptoms were not related to the pain in her chest.  The history is provided by the patient.    Past Medical History  Diagnosis Date  . Multiple sclerosis   . Depression   . PTSD (post-traumatic stress disorder)   . Paraplegia   . Transverse myelitis   . Bladder stones     Hx of  . GERD (gastroesophageal reflux disease)    Past Surgical History  Procedure Laterality Date  . Tonsillectomy    . Cholecystectomy    . Cystectomy w/ ureteroileal conduit    . Wisdom tooth extraction     History reviewed. No pertinent family history. History  Substance Use Topics  . Smoking status: Never Smoker   . Smokeless tobacco: Never Used  . Alcohol Use: No   OB History    No data available     Review of Systems  Constitutional: Negative for fever and chills.  Respiratory: Negative for shortness of breath.   Cardiovascular: Negative for chest pain.  Gastrointestinal: Negative for nausea,  vomiting, diarrhea and constipation.  Genitourinary: Negative for dysuria.  Musculoskeletal: Positive for arthralgias.  Skin: Positive for rash.  All other systems reviewed and are negative.     Allergies  Other; Aspirin; Iodine; Latex; Shellfish-derived products; Strawberry; and Vancomycin  Home Medications   Prior to Admission medications   Medication Sig Start Date End Date Taking? Authorizing Provider  baclofen (LIORESAL) 20 MG tablet Take 20 mg by mouth 3 (three) times daily.   Yes Historical Provider, MD  diazepam (VALIUM) 5 MG tablet Take 1-2 tablets (5-10 mg total) by mouth every 8 (eight) hours. Take 1 tablet in AM, 1 tablet in PM, and 2 tablets at bedtime 04/02/13  Yes Robbie Lis, MD  fluticasone [facility]) 50 MCG/ACT nasal spray Place 2 sprays into the nose daily.   Yes Historical Provider, MD  gabapentin (NEURONTIN) 400 MG capsule Take 800 mg by mouth 3 (three) times daily. 01/15/14  Yes Historical Provider, MD  ibuprofen (ADVIL,MOTRIN) 200 MG tablet Take 400 mg by mouth every 6 (six) hours as needed. For pain   Yes Historical Provider, MD  lamoTRIgine (LAMICTAL) 25 MG tablet Take 125 mg by mouth daily.   Yes Historical Provider, MD  loratadine (CLARITIN) 10 MG tablet Take 10 mg by mouth daily.   Yes Historical Provider, MD  lubiprostone (AMITIZA) 8 MCG capsule Take 8 mcg by mouth 2 (two) times daily with a meal.    Yes Historical Provider, MD  omeprazole (PRILOSEC) 40  MG capsule Take 40 mg by mouth daily.   Yes Historical Provider, MD  ondansetron (ZOFRAN) 8 MG tablet Take 8 mg by mouth every 8 (eight) hours as needed. For nausea   Yes Historical Provider, MD  rizatriptan (MAXALT-MLT) 10 MG disintegrating tablet Take 10 mg by mouth as needed. For migraines; may repeat in 1 hour if needed.   Yes Historical Provider, MD  sertraline (ZOLOFT) 100 MG tablet Take 100 mg by mouth daily.   Yes Historical Provider, MD  Sod Citrate-Citric Acid (CYTRA-2 PO) Take 30 mLs by mouth at  bedtime.   Yes Historical Provider, MD   BP 108/60 mmHg  Pulse 96  Temp(Src) 97.8 F (36.6 C) (Oral)  Resp 18  SpO2 97%  LMP 08/29/2014 (Exact Date) Physical Exam  Constitutional: She is oriented to person, place, and time. She appears well-developed and well-nourished.  HENT:  Head: Normocephalic and atraumatic.  Eyes: Conjunctivae and EOM are normal. Pupils are equal, round, and reactive to light.  Neck: Normal range of motion. Neck supple.  Cardiovascular: Normal rate and regular rhythm.  Exam reveals no gallop and no friction rub.   No murmur heard. Pulmonary/Chest: Effort normal and breath sounds normal. No respiratory distress. She has no wheezes. She has no rales. She exhibits no tenderness.  Anterior chest wall is unremarkable, there is no evidence of abscess, the firm area that the patient describes in her history is actually her inferior left rib margin, and there is no bony abnormality or deformity, lungs are clear, no respiratory distress  Abdominal: Soft. Bowel sounds are normal. She exhibits no distension and no mass. There is no tenderness. There is no rebound and no guarding.  Musculoskeletal: Normal range of motion. She exhibits no edema or tenderness.  Neurological: She is alert and oriented to person, place, and time.  Skin: Skin is warm and dry.  There is some evidence of yeast infection beneath the breast folds  Psychiatric: She has a normal mood and affect. Her behavior is normal. Judgment and thought content normal.  Nursing note and vitals reviewed.   ED Course  Procedures (including critical care time) Labs Review Labs Reviewed - No data to display  Imaging Review Dg Chest 2 View  09/13/2014   CLINICAL DATA:  Possible mass in the left chest wall. Vaginal discharge.  EXAM: CHEST  2 VIEW  COMPARISON:  PA and lateral chest 03/30/2013.  FINDINGS: Heart size and mediastinal contours are within normal limits. Both lungs are clear. Visualized skeletal structures  are unremarkable.  IMPRESSION: Negative exam.   Electronically Signed   By: Inge Rise M.D.   On: 09/13/2014 18:39     EKG Interpretation None      MDM   Final diagnoses:  Encounter for medical assessment  Chest wall pain    Patient here for evaluation of chest mass. On my exam mass that the patient is describing is actually her left inferior ribs. There is no bony abnormality or deformity. No evidence of abscess. The left breast is remarkable for a mild Candida infection. Regarding the vaginal discharge, the patient does not want this investigated here. She does not have any odor, pain, irritation, or itching. She states that the only reason she related this information was to be certain that it was not related to the pain on her chest. Patient is accompanied by her mother, who insists that an x-ray be performed. I will order this for patient satisfaction, however I am less suspicious of bony  abnormality or other acute process.  Patient seen by and discussed with Dr. Johnney Killian, who recommends Norco. Probable chest wall developing deformity secondary to body positioning. Patient has OTC medication for possible yeast infection. We'll continue this. Recommend primary care follow-up. Patient understands and agrees with the plan. She is stable and ready for discharge.    Montine Circle, PA-C 09/13/14 2001  Charlesetta Shanks, MD 09/14/14 [phone]

## 2014-09-22 ENCOUNTER — Ambulatory Visit (INDEPENDENT_AMBULATORY_CARE_PROVIDER_SITE_OTHER): Payer: Medicaid Other | Admitting: Diagnostic Neuroimaging

## 2014-09-22 ENCOUNTER — Encounter: Payer: Self-pay | Admitting: Diagnostic Neuroimaging

## 2014-09-22 VITALS — BP 91/58 | HR 87 | Wt 145.0 lb

## 2014-09-22 DIAGNOSIS — G373 Acute transverse myelitis in demyelinating disease of central nervous system: Secondary | ICD-10-CM

## 2014-09-22 DIAGNOSIS — G0489 Other myelitis: Secondary | ICD-10-CM | POA: Diagnosis not present

## 2014-09-22 NOTE — Progress Notes (Signed)
[facility]  PATIENT: [name] DOB: 23-Jun-1980  REFERRING CLINICIAN:  HISTORY FROM: patient  REASON FOR VISIT: follow up   HISTORICAL  CHIEF COMPLAINT:  Chief Complaint  Patient presents with  . Transverse myelitis    rm 6    HISTORY OF PRESENT ILLNESS:   UPDATE 09/22/14: Since last visit, went to ER for evaluation, of left chest wall pain and subjective mass. ER eval done and they felt it was related to chronic body positioning and bone deformity/musculoskeletal problems. Neuro symptoms stable.  UPDATE 09/21/13: Since last visit, doing well. Here for face-face eval for power wheelchair. Has completed PT evaluation. Spasticity is stable. Pain is stable.   UPDATE 10/20/12: stable, but here to request wheelchair replacement evaluation and rx. Her chair is approx 34 years old and wearing out. Hoping to get an upgraded model that is safer (she get stuck in the straping due to spasms, and wheels are wearing out).  UPDATE 12/17/11: No new symptoms. still on diazepam, baclofen and gabapentin. stable dosing for several years now.   UPDATE 12/25/10: complains of more fatigue.  also with facial rash for last few weeks.  otherwise muscle spasms and weakness are unchanged.  toelrating gabapentin, diazepam, and baclofen.  UPDATE 05/24/10: Doing very well, since her last visit, when her intake of gabapentin was increased to 800 mg tid. Since then she did not have as much muscle spasm between the intake of her medication as she had before.She also states that she did not have to take any antibiotics in the last 6 month, which also contribute to her feeling much better. The patient also reports that her mood has improved, because now she has a more active social life, she also has a boy friend right now.  The patient is asking for a refill of Zomig for her seldom occuring migraines, which we will order.  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 Doy Mince.  Doing well since last visit (08/11/09).  Having some more muscle spasm / clonus in feet / calves.    PRIOR HPI: Patient returns today for followup. She has been followed in this office since February 2005, when she suffered acute cervical transverse myelitis involving C4-C7, with resultant spastic paraplegia, left greater than right upper extremity weakness, and urinary dysfunction. She had a presumed syncopal spell in the summer of 2009, for which MRI and EEG were unrevealing. She has flexor spasms, for which she takes baclofen, Neurontin, and Valium. She has seen a psychiatrist who has diagnosed her with major depression and PTSD. Her psychiatrist placed her on Lamictal, which has been helpful for her mood. I last saw her in November.  REVIEW OF SYSTEMS: Full 14 system review of systems performed and notable only for as per HPI.   ALLERGIES: Allergies  Allergen Reactions  . Other Anaphylaxis    ALL -MYCINS  . Aspirin Other (See Comments)    Reaction unknown  . Iodine Other (See Comments)    Reaction unknown  . Latex Other (See Comments)    Reaction unknown  . Shellfish-Derived Products Other (See Comments)    Reaction unknown  . Strawberry Hives  . Vancomycin Other (See Comments)    Reaction unknown    HOME MEDICATIONS: Outpatient Prescriptions Prior to Visit  Medication Sig Dispense Refill  . baclofen (LIORESAL) 20 MG tablet Take 20 mg by mouth 3 (three) times daily.    Marland Kitchen  diazepam (VALIUM) 5 MG tablet Take 1-2 tablets (5-10 mg total) by mouth every 8 (eight) hours. Take 1 tablet in AM, 1 tablet in PM, and 2 tablets at bedtime 30 tablet 0  . fluticasone (FLONASE) 50 MCG/ACT nasal spray Place 2 sprays into the nose daily.    Marland Kitchen gabapentin (NEURONTIN) 400 MG capsule Take 800 mg by mouth 3 (three) times daily.  1  . HYDROcodone-acetaminophen (NORCO/VICODIN) 5-325 MG per tablet Take 1-2 tablets by mouth every 6 (six) hours as  needed. 6 tablet 0  . ibuprofen (ADVIL,MOTRIN) 200 MG tablet Take 400 mg by mouth every 6 (six) hours as needed. For pain    . lamoTRIgine (LAMICTAL) 25 MG tablet Take 125 mg by mouth daily.    Marland Kitchen loratadine (CLARITIN) 10 MG tablet Take 10 mg by mouth daily.    Marland Kitchen lubiprostone (AMITIZA) 8 MCG capsule Take 8 mcg by mouth 2 (two) times daily with a meal.     . omeprazole (PRILOSEC) 40 MG capsule Take 40 mg by mouth daily.    . ondansetron (ZOFRAN) 8 MG tablet Take 8 mg by mouth every 8 (eight) hours as needed. For nausea    . rizatriptan (MAXALT-MLT) 10 MG disintegrating tablet Take 10 mg by mouth as needed. For migraines; may repeat in 1 hour if needed.    . sertraline (ZOLOFT) 100 MG tablet Take 100 mg by mouth daily.    . Sod Citrate-Citric Acid (CYTRA-2 PO) Take 30 mLs by mouth at bedtime.     No facility-administered medications prior to visit.    PAST MEDICAL HISTORY: Past Medical History  Diagnosis Date  . Multiple sclerosis   . Depression   . PTSD (post-traumatic stress disorder)   . Paraplegia   . Transverse myelitis   . Bladder stones     Hx of  . GERD (gastroesophageal reflux disease)     PAST SURGICAL HISTORY: Past Surgical History  Procedure Laterality Date  . Tonsillectomy    . Cholecystectomy    . Cystectomy w/ ureteroileal conduit    . Wisdom tooth extraction      FAMILY HISTORY: History reviewed. No pertinent family history.  SOCIAL HISTORY:  History   Social History  . Marital Status: Single    Spouse Name: N/A  . Number of Children: N/A  . Years of Education: N/A   Occupational History  . Not on file.   Social History Main Topics  . Smoking status: Never Smoker   . Smokeless tobacco: Never Used  . Alcohol Use: No  . Drug Use: No  . Sexual Activity: No   Other Topics Concern  . Not on file   Social History Narrative   Lives with parents.  All disabled.  Attended [employer] until she got transverse myelitis.     PHYSICAL EXAM  Filed Vitals:     09/22/14 1107  BP: 91/58  Pulse: 87  Weight: 145 lb (65.772 kg)    Not recorded      Body mass index is 25.69 kg/(m^2).  GENERAL EXAM: General: Patient is well developed and well groomed. Patient is awake and alert and in no acute distress. Cardiovascular: Regular rate and rhythm with no murmurs. No carotid bruit.  Neurologic Exam  Mental Status: Awake, alert. Language is fluent and comprehensive. Cranial Nerves: Pupils are equal and round and reactive to light. Conjugate eye movements are full and symmetric. Visual fields are full to confrontation. No evidence of papilledema or other changes on funduscopy. Facial sensation  and facial muscle strength are symmetric and in normal limits. Hearing is intact. Palate elevated symmetrically and uvula is midline. Shoulder shrug is symmetric. Tongue is midline. Motor: ATROPHY OF BILATERAL HAND LEG MUSCLES; INCREASED TONE IN LUE > RUE. BUE PROX 4/5; LEFT HAND GRIP, FINGER ABDUCTION 2/5; RIGHT HAND 4/5.  MOTOR TONE INCREASED IN BLE (RLE 1, LLE 0) Sensory: Intact sensation to light touch, DECREASED SENSATION ON THE LEFT ARM AND LEFT LEG Gait and Station: WHEELCHAIR BOUND; LEANING TO LEFT SIDE Reflexes: BUE 3, KNEES 3, ANKLES 2.    DIAGNOSTIC DATA (LABS, IMAGING, TESTING) - I reviewed patient records, labs, notes, testing and imaging myself where available.  Lab Results  Component Value Date   WBC 10.1 03/31/2013   HGB 12.2 03/31/2013   HCT 35.1* 03/31/2013   MCV 84.0 03/31/2013   PLT 173 03/31/2013      Component Value Date/Time   NA 138 03/31/2013 0413   K 4.6 03/31/2013 0413   CL 106 03/31/2013 0413   CO2 21 03/31/2013 0413   GLUCOSE 98 03/31/2013 0413   BUN 6 03/31/2013 0413   CREATININE 0.41* 03/31/2013 0413   CALCIUM 8.5 03/31/2013 0413   PROT 7.0 01/10/2012 0055   ALBUMIN 3.9 01/10/2012 0055   AST 15 01/10/2012 0055   ALT 14 01/10/2012 0055   ALKPHOS 96 01/10/2012 0055   BILITOT 0.2* 01/10/2012 0055   GFRNONAA >90  03/31/2013 0413   GFRAA >90 03/31/2013 0413   No results found for: CHOL No results found for: HGBA1C Lab Results  Component Value Date   VITAMINB12 855 11/04/2007   No results found for: TSH     ASSESSMENT AND PLAN  34 y.o.  female with history of C4-C7 transverse myelitis in Feb 2005.  No active ongoing treatments for transverse myelitis. Symptom management with diazepam, baclofen and gabepentin. Overall stable.  PLAN: 1. continue meds 2. PCP follow up of SOB and left chest wall pain/mass sensation  Return if symptoms worsen or fail to improve, for return to PCP.    Penni Bombard, MD 04/04/7251, 66:44 AM Certified in Neurology, Neurophysiology and Neuroimaging  [facility] [address] [place], [place] [address] [phone]

## 2014-09-27 ENCOUNTER — Emergency Department (HOSPITAL_COMMUNITY): Payer: Medicaid Other

## 2014-09-27 ENCOUNTER — Encounter (HOSPITAL_COMMUNITY): Payer: Self-pay | Admitting: Emergency Medicine

## 2014-09-27 ENCOUNTER — Observation Stay (HOSPITAL_COMMUNITY)
Admission: EM | Admit: 2014-09-27 | Discharge: 2014-09-28 | Disposition: A | Discharge status: Home / Self Care | Payer: Medicaid Other | Attending: Internal Medicine | Admitting: Internal Medicine

## 2014-09-27 DIAGNOSIS — K219 Gastro-esophageal reflux disease without esophagitis: Secondary | ICD-10-CM | POA: Insufficient documentation

## 2014-09-27 DIAGNOSIS — Z7951 Long term (current) use of inhaled steroids: Secondary | ICD-10-CM | POA: Insufficient documentation

## 2014-09-27 DIAGNOSIS — Z91040 Latex allergy status: Secondary | ICD-10-CM | POA: Diagnosis not present

## 2014-09-27 DIAGNOSIS — R109 Unspecified abdominal pain: Secondary | ICD-10-CM

## 2014-09-27 DIAGNOSIS — K5900 Constipation, unspecified: Secondary | ICD-10-CM | POA: Diagnosis present

## 2014-09-27 DIAGNOSIS — R1084 Generalized abdominal pain: Principal | ICD-10-CM | POA: Insufficient documentation

## 2014-09-27 DIAGNOSIS — G35 Multiple sclerosis: Secondary | ICD-10-CM | POA: Diagnosis present

## 2014-09-27 DIAGNOSIS — Z79899 Other long term (current) drug therapy: Secondary | ICD-10-CM | POA: Insufficient documentation

## 2014-09-27 DIAGNOSIS — G8220 Paraplegia, unspecified: Secondary | ICD-10-CM | POA: Diagnosis present

## 2014-09-27 DIAGNOSIS — R1110 Vomiting, unspecified: Secondary | ICD-10-CM

## 2014-09-27 DIAGNOSIS — R112 Nausea with vomiting, unspecified: Secondary | ICD-10-CM | POA: Diagnosis present

## 2014-09-27 DIAGNOSIS — F329 Major depressive disorder, single episode, unspecified: Secondary | ICD-10-CM | POA: Diagnosis not present

## 2014-09-27 DIAGNOSIS — F4310 Post-traumatic stress disorder, unspecified: Secondary | ICD-10-CM | POA: Diagnosis not present

## 2014-09-27 LAB — URINE MICROSCOPIC-ADD ON

## 2014-09-27 LAB — CBC WITH DIFFERENTIAL/PLATELET
Basophils Absolute: 0 10*3/uL (ref 0.0–0.1)
Basophils Relative: 0 % (ref 0–1)
EOS ABS: 0.1 10*3/uL (ref 0.0–0.7)
Eosinophils Relative: 1 % (ref 0–5)
HCT: 38.7 % (ref 36.0–46.0)
Hemoglobin: 13.2 g/dL (ref 12.0–15.0)
Lymphocytes Relative: 11 % — ABNORMAL LOW (ref 12–46)
Lymphs Abs: 1.9 10*3/uL (ref 0.7–4.0)
MCH: 28.8 pg (ref 26.0–34.0)
MCHC: 34.1 g/dL (ref 30.0–36.0)
MCV: 84.5 fL (ref 78.0–100.0)
Monocytes Absolute: 0.9 10*3/uL (ref 0.1–1.0)
Monocytes Relative: 5 % (ref 3–12)
Neutro Abs: 14.6 10*3/uL — ABNORMAL HIGH (ref 1.7–7.7)
Neutrophils Relative %: 83 % — ABNORMAL HIGH (ref 43–77)
PLATELETS: 271 10*3/uL (ref 150–400)
RBC: 4.58 MIL/uL (ref 3.87–5.11)
RDW: 12.7 % (ref 11.5–15.5)
WBC: 17.5 10*3/uL — ABNORMAL HIGH (ref 4.0–10.5)

## 2014-09-27 LAB — COMPREHENSIVE METABOLIC PANEL
ALK PHOS: 88 U/L (ref 38–126)
ALT: 18 U/L (ref 14–54)
ANION GAP: 12 (ref 5–15)
AST: 25 U/L (ref 15–41)
Albumin: 4.5 g/dL (ref 3.5–5.0)
BUN: 17 mg/dL (ref 6–20)
CO2: 21 mmol/L — AB (ref 22–32)
Calcium: 8.8 mg/dL — ABNORMAL LOW (ref 8.9–10.3)
Chloride: 108 mmol/L (ref 101–111)
Creatinine, Ser: 0.53 mg/dL (ref 0.44–1.00)
GFR calc Af Amer: >60 mL/min (ref >60)
GFR calc non Af Amer: >60 mL/min (ref >60)
GLUCOSE: 137 mg/dL — AB (ref 65–99)
Potassium: 3.3 mmol/L — ABNORMAL LOW (ref 3.5–5.1)
SODIUM: 141 mmol/L (ref 135–145)
TOTAL PROTEIN: 7.7 g/dL (ref 6.5–8.1)
Total Bilirubin: 1.1 mg/dL (ref 0.3–1.2)

## 2014-09-27 LAB — URINALYSIS, ROUTINE W REFLEX MICROSCOPIC
BILIRUBIN URINE: NEGATIVE
GLUCOSE, UA: NEGATIVE mg/dL
KETONES UR: NEGATIVE mg/dL
Nitrite: POSITIVE — AB
Protein, ur: NEGATIVE mg/dL
Specific Gravity, Urine: 1.017 (ref 1.005–1.030)
Urobilinogen, UA: 1 mg/dL (ref 0.0–1.0)
pH: 6 (ref 5.0–8.0)

## 2014-09-27 LAB — CBG MONITORING, ED: Glucose-Capillary: 115 mg/dL — ABNORMAL HIGH (ref 65–99)

## 2014-09-27 LAB — LIPASE, BLOOD: Lipase: 11 U/L — ABNORMAL LOW (ref 22–51)

## 2014-09-27 MED ORDER — HEPARIN SODIUM (PORCINE) 5000 UNIT/ML IJ SOLN
5000.0000 [IU] | Freq: Three times a day (TID) | INTRAMUSCULAR | Status: DC
  Administered 2014-09-28 (×3): 5000 [IU] via SUBCUTANEOUS
  Filled 2014-09-27 (×5): qty 1

## 2014-09-27 MED ORDER — SODIUM CHLORIDE 0.9 % IV SOLN
INTRAVENOUS | Status: DC
  Administered 2014-09-27 – 2014-09-28 (×2): via INTRAVENOUS

## 2014-09-27 MED ORDER — ONDANSETRON HCL 4 MG/2ML IJ SOLN
4.0000 mg | Freq: Once | INTRAMUSCULAR | Status: AC
  Administered 2014-09-27: 4 mg via INTRAVENOUS
  Filled 2014-09-27: qty 2

## 2014-09-27 MED ORDER — FLUTICASONE PROPIONATE 50 MCG/ACT NA SUSP
2.0000 | Freq: Every day | NASAL | Status: DC
  Administered 2014-09-28: 2 via NASAL
  Filled 2014-09-27: qty 16

## 2014-09-27 MED ORDER — LAMOTRIGINE 25 MG PO TABS
125.0000 mg | ORAL_TABLET | Freq: Every day | ORAL | Status: DC
  Administered 2014-09-28: 125 mg via ORAL
  Filled 2014-09-27: qty 1

## 2014-09-27 MED ORDER — SERTRALINE HCL 100 MG PO TABS
100.0000 mg | ORAL_TABLET | Freq: Every day | ORAL | Status: DC
  Administered 2014-09-28: 100 mg via ORAL
  Filled 2014-09-27: qty 1

## 2014-09-27 MED ORDER — IBUPROFEN 200 MG PO TABS
400.0000 mg | ORAL_TABLET | Freq: Four times a day (QID) | ORAL | Status: DC | PRN
  Administered 2014-09-28: 400 mg via ORAL
  Filled 2014-09-27: qty 2

## 2014-09-27 MED ORDER — PANTOPRAZOLE SODIUM 40 MG PO TBEC
80.0000 mg | DELAYED_RELEASE_TABLET | Freq: Every day | ORAL | Status: DC
  Administered 2014-09-28: 80 mg via ORAL
  Filled 2014-09-27: qty 2

## 2014-09-27 MED ORDER — DIAZEPAM 5 MG PO TABS: 5.0000 mg | ORAL_TABLET | Freq: Three times a day (TID) | ORAL | Status: DC

## 2014-09-27 MED ORDER — ONDANSETRON HCL 4 MG/2ML IJ SOLN: 4.0000 mg | Freq: Four times a day (QID) | INTRAMUSCULAR | Status: DC | PRN

## 2014-09-27 MED ORDER — BACLOFEN 20 MG PO TABS
20.0000 mg | ORAL_TABLET | Freq: Three times a day (TID) | ORAL | Status: DC
  Administered 2014-09-28 (×3): 20 mg via ORAL
  Filled 2014-09-27 (×4): qty 1

## 2014-09-27 MED ORDER — LUBIPROSTONE 8 MCG PO CAPS
8.0000 ug | ORAL_CAPSULE | Freq: Two times a day (BID) | ORAL | Status: DC
  Administered 2014-09-28 (×2): 8 ug via ORAL
  Filled 2014-09-27 (×3): qty 1

## 2014-09-27 MED ORDER — SODIUM CHLORIDE 0.9 % IV BOLUS (SEPSIS)
1000.0000 mL | Freq: Once | INTRAVENOUS | Status: AC
  Administered 2014-09-27: 1000 mL via INTRAVENOUS

## 2014-09-27 MED ORDER — DIAZEPAM 5 MG PO TABS
10.0000 mg | ORAL_TABLET | Freq: Every day | ORAL | Status: DC
  Administered 2014-09-28: 10 mg via ORAL
  Filled 2014-09-27: qty 2

## 2014-09-27 MED ORDER — HYDROCODONE-ACETAMINOPHEN 5-325 MG PO TABS: 1.0000 | ORAL_TABLET | Freq: Four times a day (QID) | ORAL | Status: DC | PRN

## 2014-09-27 MED ORDER — GABAPENTIN 400 MG PO CAPS
800.0000 mg | ORAL_CAPSULE | Freq: Three times a day (TID) | ORAL | Status: DC
  Administered 2014-09-28 (×3): 800 mg via ORAL
  Filled 2014-09-27 (×4): qty 2

## 2014-09-27 MED ORDER — LORATADINE 10 MG PO TABS
10.0000 mg | ORAL_TABLET | Freq: Every day | ORAL | Status: DC
  Administered 2014-09-28: 10 mg via ORAL
  Filled 2014-09-27: qty 1

## 2014-09-27 MED ORDER — ONDANSETRON HCL 4 MG PO TABS: 4.0000 mg | ORAL_TABLET | Freq: Four times a day (QID) | ORAL | Status: DC | PRN

## 2014-09-27 MED ORDER — DIAZEPAM 5 MG PO TABS
5.0000 mg | ORAL_TABLET | ORAL | Status: DC
  Administered 2014-09-28 (×2): 5 mg via ORAL
  Filled 2014-09-27 (×2): qty 1

## 2014-09-27 NOTE — ED Notes (Signed)
Patient transported to X-ray 

## 2014-09-27 NOTE — H&P (Signed)
[facility] History and Physical  [name] TMH:[id number] DOB: [birth year] DOA: 09/27/2014  Referring physician: EDP PCP: Simona Huh, MD   Chief Complaint: Emesis   HPI: [name] is a 34 y.o. 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  Patient presents to the ED with 24 hours of intermittent vomiting.  There is associated abdominal pain, no chills, no diarrhea, no fever.  Nothing makes symptoms better nor worse.  Work up in ED demonstrates findings consistent with constipation / impaction / obstipation.  Manual disimpaction by EDP yields a massive BM in the ED, still had an episode of vomiting after the BM however so patient being admitted for overnight obs.  Review of Systems: Systems reviewed.  As above, otherwise negative  Past Medical History  Diagnosis Date  . Multiple sclerosis   . Depression   . PTSD (post-traumatic stress disorder)   . Paraplegia   . Transverse myelitis   . Bladder stones     Hx of  . GERD (gastroesophageal reflux disease)    Past Surgical History  Procedure Laterality Date  . Tonsillectomy    . Cholecystectomy    . Cystectomy w/ ureteroileal conduit    . Wisdom tooth extraction     Social History:  reports that she has never smoked. She has never used smokeless tobacco. She reports that she does not drink alcohol or use illicit drugs.  Allergies  Allergen Reactions  . Other Anaphylaxis    ALL -MYCINS  . Aspirin Other (See Comments)    Reaction unknown  . Iodine Other (See Comments)    Reaction unknown  . Latex Other (See Comments)    Reaction unknown  . Shellfish-Derived Products Other (See Comments)    Reaction unknown  . Strawberry Hives  . Vancomycin Other (See Comments)    Reaction unknown    No family history on file.   Prior to Admission medications   Medication Sig Start Date End Date Taking? Authorizing Provider  baclofen (LIORESAL) 20 MG tablet Take 20 mg by mouth 3 (three) times daily.    Yes Historical Provider, MD  diazepam (VALIUM) 5 MG tablet Take 1-2 tablets (5-10 mg total) by mouth every 8 (eight) hours. Take 1 tablet in AM, 1 tablet in PM, and 2 tablets at bedtime 04/02/13  Yes Robbie Lis, MD  fluticasone [facility]) 50 MCG/ACT nasal spray Place 2 sprays into the nose daily.   Yes Historical Provider, MD  gabapentin (NEURONTIN) 400 MG capsule Take 800 mg by mouth 3 (three) times daily. 01/15/14  Yes Historical Provider, MD  HYDROcodone-acetaminophen (NORCO/VICODIN) 5-325 MG per tablet Take 1-2 tablets by mouth every 6 (six) hours as needed. 09/13/14  Yes Montine Circle, PA-C  ibuprofen (ADVIL,MOTRIN) 200 MG tablet Take 400 mg by mouth every 6 (six) hours as needed. For pain   Yes Historical Provider, MD  lamoTRIgine (LAMICTAL) 25 MG tablet Take 125 mg by mouth daily.   Yes Historical Provider, MD  loratadine (CLARITIN) 10 MG tablet Take 10 mg by mouth daily.   Yes Historical Provider, MD  lubiprostone (AMITIZA) 8 MCG capsule Take 8 mcg by mouth 2 (two) times daily with a meal.    Yes Historical Provider, MD  omeprazole (PRILOSEC) 40 MG capsule Take 40 mg by mouth daily.   Yes Historical Provider, MD  ondansetron (ZOFRAN) 8 MG tablet Take 8 mg by mouth every 8 (eight) hours as needed. For nausea   Yes Historical Provider, MD  rizatriptan (MAXALT-MLT) 10 MG  disintegrating tablet Take 10 mg by mouth as needed. For migraines; may repeat in 1 hour if needed.   Yes Historical Provider, MD  sertraline (ZOLOFT) 100 MG tablet Take 100 mg by mouth daily.   Yes Historical Provider, MD  Sod Citrate-Citric Acid (CYTRA-2 PO) Take 30 mLs by mouth at bedtime.   Yes Historical Provider, MD   Physical Exam: Filed Vitals:   09/27/14 2223  BP: 114/64  Pulse: 110  Temp:   Resp: 22    BP 114/64 mmHg  Pulse 110  Temp(Src) 99.4 F (37.4 C) (Oral)  Resp 22  SpO2 97%  LMP 08/29/2014 (Exact Date)  General Appearance:    Alert, oriented, no distress, appears stated age  Head:     Normocephalic, atraumatic  Eyes:    PERRL, EOMI, sclera non-icteric        Nose:   Nares without drainage or epistaxis. Mucosa, turbinates normal  Throat:   Moist mucous membranes. Oropharynx without erythema or exudate.  Neck:   Supple. No carotid bruits.  No thyromegaly.  No lymphadenopathy.   Back:     No CVA tenderness, no spinal tenderness  Lungs:     Clear to auscultation bilaterally, without wheezes, rhonchi or rales  Chest wall:    No tenderness to palpitation  Heart:    Regular rate and rhythm without murmurs, gallops, rubs  Abdomen:     Soft, non-tender, nondistended, normal bowel sounds, no organomegaly  Genitalia:    deferred  Rectal:    deferred  Extremities:   No clubbing, cyanosis or edema.  Pulses:   2+ and symmetric all extremities  Skin:   Skin color, texture, turgor normal, no rashes or lesions  Lymph nodes:   Cervical, supraclavicular, and axillary nodes normal  Neurologic:   CNII-XII intact. Normal strength, sensation and reflexes      throughout    Labs on Admission:  Basic Metabolic Panel:  Recent Labs Lab 09/27/14 1549  NA 141  K 3.3*  CL 108  CO2 21*  GLUCOSE 137*  BUN 17  CREATININE 0.53  CALCIUM 8.8*   Liver Function Tests:  Recent Labs Lab 09/27/14 1549  AST 25  ALT 18  ALKPHOS 88  BILITOT 1.1  PROT 7.7  ALBUMIN 4.5    Recent Labs Lab 09/27/14 1549  LIPASE 11*   No results for input(s): AMMONIA in the last 168 hours. CBC:  Recent Labs Lab 09/27/14 1321  WBC 17.5*  NEUTROABS 14.6*  HGB 13.2  HCT 38.7  MCV 84.5  PLT 271   Cardiac Enzymes: No results for input(s): CKTOTAL, CKMB, CKMBINDEX, TROPONINI in the last 168 hours.  BNP (last 3 results) No results for input(s): PROBNP in the last 8760 hours. CBG:  Recent Labs Lab 09/27/14 1743  GLUCAP 115*    Radiological Exams on Admission: Ct Abdomen Pelvis Wo Contrast  09/27/2014   CLINICAL DATA:  Acute generalized abdominal pain.  EXAM: CT ABDOMEN AND PELVIS WITHOUT  CONTRAST  TECHNIQUE: Multidetector CT imaging of the abdomen and pelvis was performed following the standard protocol without IV contrast.  COMPARISON:  CT scan of January 10, 2012.  FINDINGS: Visualized lung bases appear normal. No significant osseous abnormality is noted.  Status post cholecystectomy. No focal abnormality is noted in the liver, spleen or pancreas. Adrenal glands appear normal. Patient is status post ileal conduit placement and cystectomy. Minimal bilateral hydroureteronephrosis is noted without obstructing calculus. Large amount of stool is noted throughout the colon, with large amount  of stool seen in the rectum concerning for rectal impaction. Uterus and ovaries appear normal. No significant adenopathy is noted. No abnormal fluid collection is noted.  IMPRESSION: Minimal bilateral hydroureteronephrosis is noted without obstructing calculus. Patient is status post ileal conduit placement and cystectomy.  Large amount of stool is seen throughout the colon and rectum concerning for rectal impaction and constipation.   Electronically Signed   By: Marijo Conception, M.D.   On: 09/27/2014 20:47   Dg Abd Acute W/chest  09/27/2014   CLINICAL DATA:  Vomiting beginning at 6 p.m. 09/26/2014. Abdominal pain. Initial encounter.  EXAM: DG ABDOMEN ACUTE W/ 1V CHEST  COMPARISON:  PA and lateral chest 09/13/2014.  FINDINGS: Single view of the chest demonstrates clear lungs and normal heart size. No pneumothorax or pleural effusion.  Two views of the abdomen show no free intraperitoneal air. There is no evidence of bowel obstruction. The patient has a very large volume of stool throughout the colon. No abnormal abdominal calcification is seen. Cholecystectomy clips are noted.  IMPRESSION: No acute finding chest or abdomen.  Large colonic stool burden.   Electronically Signed   By: Inge Rise M.D.   On: 09/27/2014 17:27    EKG: Independently reviewed.  Assessment/Plan Principal Problem:    Obstipation Active Problems:   Paraplegia   Multiple sclerosis   Nausea and vomiting   1. Obstipation - resolved post disimpaction 2. N/V - presumably due to obstipation. 1. Admit for overnight observation 2. zofran PRN 3. Attempt to advance to clear liquid diet    Code Status: Full Code  Family Communication: Family at bedside Disposition Plan: Admit to obs   Time spent: 50 min  Geanine Vandekamp M. [facility] Pager [phone]  If 7AM-7PM, please contact the day team taking care of the patient [email or web address] Password [facility] 09/27/2014, 10:49 PM

## 2014-09-27 NOTE — ED Notes (Signed)
Per Ulice Dash in lab, light green top was "grossly hemolyzed and will need to be re-collected".

## 2014-09-27 NOTE — ED Notes (Signed)
Pt attempted to drink ginger ale, however vomited twice.

## 2014-09-27 NOTE — ED Notes (Signed)
Pt started vomiting since 6pm last night. Pt has abd pain and body pain above her waist.  Pt will stop vomiting and then move her head or body in bed and then vomiting starts again.

## 2014-09-27 NOTE — ED Provider Notes (Signed)
CSN: [id number]     Arrival date & time 09/27/14  1248 History   First MD Initiated Contact with Patient 09/27/14 1532     Chief Complaint  Patient presents with  . Emesis     (Consider location/radiation/quality/duration/timing/severity/associated sxs/prior Treatment) Patient is a 34 y.o. female presenting with vomiting. The history is provided by the patient and a parent.  Emesis Severity:  Moderate Duration:  24 hours Timing:  Intermittent Quality:  Unable to specify Able to tolerate:  Liquids How soon after eating does vomiting occur:  1 hour Progression:  Worsening Chronicity:  Recurrent Recent urination:  Normal Context: not post-tussive and not self-induced   Relieved by:  Nothing Worsened by:  Nothing tried Ineffective treatments:  None tried Associated symptoms: abdominal pain   Associated symptoms: no chills, no diarrhea and no fever     Past Medical History  Diagnosis Date  . Multiple sclerosis   . Depression   . PTSD (post-traumatic stress disorder)   . Paraplegia   . Transverse myelitis   . Bladder stones     Hx of  . GERD (gastroesophageal reflux disease)    Past Surgical History  Procedure Laterality Date  . Tonsillectomy    . Cholecystectomy    . Cystectomy w/ ureteroileal conduit    . Wisdom tooth extraction     No family history on file. History  Substance Use Topics  . Smoking status: Never Smoker   . Smokeless tobacco: Never Used  . Alcohol Use: No   OB History    No data available     Review of Systems  Constitutional: Negative for chills.  Gastrointestinal: Positive for vomiting and abdominal pain. Negative for diarrhea.  All other systems reviewed and are negative.     Allergies  Other; Aspirin; Iodine; Latex; Shellfish-derived products; Strawberry; and Vancomycin  Home Medications   Prior to Admission medications   Medication Sig Start Date End Date Taking? Authorizing Provider  AZASITE 1 % ophthalmic solution INSTILL 1  DROP IN BOTH EYES BID 07/16/14   Historical Provider, MD  baclofen (LIORESAL) 20 MG tablet Take 20 mg by mouth 3 (three) times daily.    Historical Provider, MD  diazepam (VALIUM) 5 MG tablet Take 1-2 tablets (5-10 mg total) by mouth every 8 (eight) hours. Take 1 tablet in AM, 1 tablet in PM, and 2 tablets at bedtime 04/02/13   Robbie Lis, MD  fluticasone [facility]) 50 MCG/ACT nasal spray Place 2 sprays into the nose daily.    Historical Provider, MD  gabapentin (NEURONTIN) 400 MG capsule Take 800 mg by mouth 3 (three) times daily. 01/15/14   Historical Provider, MD  HYDROcodone-acetaminophen (NORCO/VICODIN) 5-325 MG per tablet Take 1-2 tablets by mouth every 6 (six) hours as needed. 09/13/14   Montine Circle, PA-C  ibuprofen (ADVIL,MOTRIN) 200 MG tablet Take 400 mg by mouth every 6 (six) hours as needed. For pain    Historical Provider, MD  lamoTRIgine (LAMICTAL) 25 MG tablet Take 125 mg by mouth daily.    Historical Provider, MD  loratadine (CLARITIN) 10 MG tablet Take 10 mg by mouth daily.    Historical Provider, MD  lubiprostone (AMITIZA) 8 MCG capsule Take 8 mcg by mouth 2 (two) times daily with a meal.     Historical Provider, MD  omeprazole (PRILOSEC) 40 MG capsule Take 40 mg by mouth daily.    Historical Provider, MD  ondansetron (ZOFRAN) 8 MG tablet Take 8 mg by mouth every 8 (eight) hours as  needed. For nausea    Historical Provider, MD  PAZEO 0.7 % SOLN INSTILL 1 DROP IN BOTH EYES BID 07/16/14   Historical Provider, MD  rizatriptan (MAXALT-MLT) 10 MG disintegrating tablet Take 10 mg by mouth as needed. For migraines; may repeat in 1 hour if needed.    Historical Provider, MD  sertraline (ZOLOFT) 100 MG tablet Take 100 mg by mouth daily.    Historical Provider, MD  Sod Citrate-Citric Acid (CYTRA-2 PO) Take 30 mLs by mouth at bedtime.    Historical Provider, MD   BP 112/66 mmHg  Temp(Src) 98.2 F (36.8 C) (Oral)  Resp 19  SpO2 99%  LMP 08/29/2014 (Exact Date) Physical Exam   Constitutional: She is oriented to person, place, and time. She appears well-developed and well-nourished.  Non-toxic appearance. No distress.  HENT:  Head: Normocephalic and atraumatic.  Mouth/Throat: Mucous membranes are dry.  Eyes: Conjunctivae, EOM and lids are normal. Pupils are equal, round, and reactive to light.  Neck: Normal range of motion. Neck supple. No tracheal deviation present. No thyroid mass present.  Cardiovascular: Normal rate, regular rhythm and normal heart sounds.  Exam reveals no gallop.   No murmur heard. Pulmonary/Chest: Effort normal and breath sounds normal. No stridor. No respiratory distress. She has no decreased breath sounds. She has no wheezes. She has no rhonchi. She has no rales.  Abdominal: Soft. Normal appearance and bowel sounds are normal. She exhibits distension. There is generalized tenderness. There is no rigidity, no rebound, no guarding and no CVA tenderness.  Genitourinary: Rectal exam shows tenderness. Rectal exam shows no external hemorrhoid.  Musculoskeletal: Normal range of motion. She exhibits no edema or tenderness.  Neurological: She is alert and oriented to person, place, and time. She displays atrophy. No cranial nerve deficit or sensory deficit. GCS eye subscore is 4. GCS verbal subscore is 5. GCS motor subscore is 6.  Skin: Skin is warm and dry. No abrasion and no rash noted.  Psychiatric: She has a normal mood and affect. Her speech is normal and behavior is normal.  Nursing note and vitals reviewed.   ED Course  Procedures (including critical care time) Labs Review Labs Reviewed  CBC WITH DIFFERENTIAL/PLATELET - Abnormal; Notable for the following:    WBC 17.5 (*)    Neutrophils Relative % 83 (*)    Neutro Abs 14.6 (*)    Lymphocytes Relative 11 (*)    All other components within normal limits  URINALYSIS, ROUTINE W REFLEX MICROSCOPIC (NOT AT [facility])  COMPREHENSIVE METABOLIC PANEL  LIPASE, BLOOD    Imaging Review No results  found.   EKG Interpretation None      MDM   Final diagnoses:  Abdominal pain    Patient given IV fluids here as well as anti-medics. Continues to vomit here. Patient did pass a very large stool on rectal exam. However due to her continued vomiting and likely obstipation will admit    Lacretia Leigh, MD 09/27/14 2242

## 2014-09-28 DIAGNOSIS — K5900 Constipation, unspecified: Secondary | ICD-10-CM

## 2014-09-28 MED ORDER — SENNOSIDES-DOCUSATE SODIUM 8.6-50 MG PO TABS: 2.0000 | ORAL_TABLET | Freq: Two times a day (BID) | ORAL | Status: DC

## 2014-09-28 MED ORDER — CHLORHEXIDINE GLUCONATE 0.12 % MT SOLN
15.0000 mL | Freq: Two times a day (BID) | OROMUCOSAL | Status: DC
  Administered 2014-09-28: 15 mL via OROMUCOSAL
  Filled 2014-09-28 (×3): qty 15

## 2014-09-28 MED ORDER — MAGNESIUM CITRATE PO SOLN
0.5000 | Freq: Once | ORAL | Status: AC
  Administered 2014-09-28: 0.5 via ORAL

## 2014-09-28 MED ORDER — MAGNESIUM CITRATE PO SOLN: 1.0000 | Freq: Once | ORAL | Status: DC

## 2014-09-28 MED ORDER — MAGNESIUM CITRATE PO SOLN: 0.5000 | ORAL | Status: DC | PRN

## 2014-09-28 MED ORDER — CETYLPYRIDINIUM CHLORIDE 0.05 % MT LIQD
7.0000 mL | Freq: Two times a day (BID) | OROMUCOSAL | Status: DC
  Administered 2014-09-28 (×2): 7 mL via OROMUCOSAL

## 2014-09-28 MED ORDER — SORBITOL 70 % SOLN
960.0000 mL | TOPICAL_OIL | Freq: Once | ORAL | Status: DC
  Filled 2014-09-28: qty 240

## 2014-09-28 MED ORDER — ACETAMINOPHEN 325 MG PO TABS: 650.0000 mg | ORAL_TABLET | ORAL | Status: DC | PRN

## 2014-09-28 MED ORDER — SODIUM CHLORIDE 0.9 % IV BOLUS (SEPSIS)
500.0000 mL | Freq: Once | INTRAVENOUS | Status: AC
  Administered 2014-09-28: 500 mL via INTRAVENOUS

## 2014-09-28 NOTE — Discharge Instructions (Signed)
Follow with Primary MD Simona Huh, MD in 7 days   Get CBC, CMP, 2 view Chest X ray checked  by Primary MD next visit.    Activity: As tolerated with Full fall precautions use walker/cane & assistance as needed   Disposition Home    Diet: soft diet, with feeding assistance and aspiration precautions.  For Heart failure patients - Check your Weight same time everyday, if you gain over 2 pounds, or you develop in leg swelling, experience more shortness of breath or chest pain, call your Primary MD immediately. Follow Cardiac Low Salt Diet and 1.5 lit/day fluid restriction.   On your next visit with your primary care physician please Get Medicines reviewed and adjusted.   Please request your Prim.MD to go over all Hospital Tests and Procedure/Radiological results at the follow up, please get all Hospital records sent to your Prim MD by signing hospital release before you go home.   If you experience worsening of your admission symptoms, develop shortness of breath, life threatening emergency, suicidal or homicidal thoughts you must seek medical attention immediately by calling 911 or calling your MD immediately  if symptoms less severe.  You Must read complete instructions/literature along with all the possible adverse reactions/side effects for all the Medicines you take and that have been prescribed to you. Take any new Medicines after you have completely understood and accpet all the possible adverse reactions/side effects.   Do not drive, operating heavy machinery, perform activities at heights, swimming or participation in water activities or provide baby sitting services if your were admitted for syncope or siezures until you have seen by Primary MD or a Neurologist and advised to do so again.  Do not drive when taking Pain medications.    Do not take more than prescribed Pain, Sleep and Anxiety Medications  Special Instructions: If you have smoked or chewed Tobacco  in the  last 2 yrs please stop smoking, stop any regular Alcohol  and or any Recreational drug use.  Wear Seat belts while driving.   Please note  You were cared for by a hospitalist during your hospital stay. If you have any questions about your discharge medications or the care you received while you were in the hospital after you are discharged, you can call the unit and asked to speak with the hospitalist on call if the hospitalist that took care of you is not available. Once you are discharged, your primary care physician will handle any further medical issues. Please note that NO REFILLS for any discharge medications will be authorized once you are discharged, as it is imperative that you return to your primary care physician (or establish a relationship with a primary care physician if you do not have one) for your aftercare needs so that they can reassess your need for medications and monitor your lab values.

## 2014-09-28 NOTE — Progress Notes (Signed)
Discharge instructions discussed with pt's mother.  Understanding verbalized; no questions or concerns.  Patient left floor via personal wheelchair with mother.  No distress noted.

## 2014-09-28 NOTE — Discharge Summary (Signed)
[name], is a 34 y.o. female  DOB [birth year]  MRN [id number].  Admission date:  09/27/2014  Admitting Physician  [name], DO  Discharge Date:  09/28/2014   Primary MD  Simona Huh, MD  Recommendations for primary care physician for things to follow:  - Check basic labs including CBC, BMP during next visit.   Admission Diagnosis  Obstipation [K59.00] Abdominal pain [R10.9]   Discharge Diagnosis  Obstipation [K59.00] Abdominal pain [R10.9]    Principal Problem:   Obstipation Active Problems:   Paraplegia   Multiple sclerosis   Nausea and vomiting      Past Medical History  Diagnosis Date  . Multiple sclerosis   . Depression   . PTSD (post-traumatic stress disorder)   . Paraplegia   . Transverse myelitis   . Bladder stones     Hx of  . GERD (gastroesophageal reflux disease)     Past Surgical History  Procedure Laterality Date  . Tonsillectomy    . Cholecystectomy    . Cystectomy w/ ureteroileal conduit    . Wisdom tooth extraction         History of present illness and  Hospital Course:     Kindly see H&P for history of present illness and admission details, please review complete Labs, Consult reports and Test reports for all details in brief  HPI  from the history and physical done on the day of admission   [name] is a 34 y.o. 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 Patient presents to the ED with 24 hours of intermittent vomiting. There is associated abdominal pain, no chills, no diarrhea, no fever. Nothing makes symptoms better nor worse.  Work up in ED demonstrates findings consistent with constipation / impaction / obstipation. Manual disimpaction by EDP yields a massive BM in the ED, still had an episode of vomiting after the BM however so patient being admitted for overnight obs. Hospital Course   Obstipation/constipation - This is secondary to fecal impaction, resolved after disimpaction, agent had multiple bowel movements overnight, abdominal exam benign in a.m. patient was instructed to take laxities as needed for constipation to avoid it getting got worse where she is impacted.  Nausea vomiting - Related to obstipation, currently resolved, initially on clear liquid diet, she tolerated, advance to soft diet.    Discharge Condition: stable   Follow UP  Follow-up Information    Follow up with Simona Huh, MD In 1 week.   Specialty:  Family Medicine   Why:  post hopitaliztion follow up.   Contact information:   [address] [place] [address] [phone]         Discharge Instructions  and  Discharge Medications     Discharge Instructions    Discharge instructions    Complete by:  As directed   Follow with Primary MD Simona Huh, MD in 7 days   Get CBC, CMP, 2 view Chest X ray checked  by Primary MD next visit.  Activity: As tolerated with Full fall precautions use walker/cane & assistance as needed   Disposition Home **   Diet: soft diet , with feeding assistance and aspiration precautions.  For Heart failure patients - Check your Weight same time everyday, if you gain over 2 pounds, or you develop in leg swelling, experience more shortness of breath or chest pain, call your Primary MD immediately. Follow Cardiac Low Salt Diet and 1.5 lit/day fluid restriction.   On your next visit with your primary care physician please Get Medicines reviewed and adjusted.   Please request your Prim.MD to go over all Hospital Tests and Procedure/Radiological results at the follow up, please get all Hospital records sent to your Prim MD by signing hospital release before you go home.   If you experience worsening of your admission symptoms, develop shortness of breath, life threatening emergency, suicidal or homicidal thoughts you must  seek medical attention immediately by calling 911 or calling your MD immediately  if symptoms less severe.  You Must read complete instructions/literature along with all the possible adverse reactions/side effects for all the Medicines you take and that have been prescribed to you. Take any new Medicines after you have completely understood and accpet all the possible adverse reactions/side effects.   Do not drive, operating heavy machinery, perform activities at heights, swimming or participation in water activities or provide baby sitting services if your were admitted for syncope or siezures until you have seen by Primary MD or a Neurologist and advised to do so again.  Do not drive when taking Pain medications.    Do not take more than prescribed Pain, Sleep and Anxiety Medications  Special Instructions: If you have smoked or chewed Tobacco  in the last 2 yrs please stop smoking, stop any regular Alcohol  and or any Recreational drug use.  Wear Seat belts while driving.   Please note  You were cared for by a hospitalist during your hospital stay. If you have any questions about your discharge medications or the care you received while you were in the hospital after you are discharged, you can call the unit and asked to speak with the hospitalist on call if the hospitalist that took care of you is not available. Once you are discharged, your primary care physician will handle any further medical issues. Please note that NO REFILLS for any discharge medications will be authorized once you are discharged, as it is imperative that you return to your primary care physician (or establish a relationship with a primary care physician if you do not have one) for your aftercare needs so that they can reassess your need for medications and monitor your lab values.            Medication List    TAKE these medications        baclofen 20 MG tablet  Commonly known as:  LIORESAL  Take 20 mg by  mouth 3 (three) times daily.     CYTRA-2 PO  Take 30 mLs by mouth at bedtime.     diazepam 5 MG tablet  Commonly known as:  VALIUM  Take 1-2 tablets (5-10 mg total) by mouth every 8 (eight) hours. Take 1 tablet in AM, 1 tablet in PM, and 2 tablets at bedtime     fluticasone 50 MCG/ACT nasal spray  Commonly known as:  FLONASE  Place 2 sprays into the nose daily.     gabapentin 400 MG capsule  Commonly known as:  NEURONTIN  Take 800 mg by mouth 3 (three) times daily.     HYDROcodone-acetaminophen 5-325 MG per tablet  Commonly known as:  NORCO/VICODIN  Take 1-2 tablets by mouth every 6 (six) hours as needed.     ibuprofen 200 MG tablet  Commonly known as:  ADVIL,MOTRIN  Take 400 mg by mouth every 6 (six) hours as needed. For pain     lamoTRIgine 25 MG tablet  Commonly known as:  LAMICTAL  Take 125 mg by mouth daily.     loratadine 10 MG tablet  Commonly known as:  CLARITIN  Take 10 mg by mouth daily.     lubiprostone 8 MCG capsule  Commonly known as:  AMITIZA  Take 8 mcg by mouth 2 (two) times daily with a meal.     magnesium citrate Soln  Take 148 mLs (0.5 Bottles total) by mouth as needed for severe constipation.     omeprazole 40 MG capsule  Commonly known as:  PRILOSEC  Take 40 mg by mouth daily.     ondansetron 8 MG tablet  Commonly known as:  ZOFRAN  Take 8 mg by mouth every 8 (eight) hours as needed. For nausea     rizatriptan 10 MG disintegrating tablet  Commonly known as:  MAXALT-MLT  Take 10 mg by mouth as needed. For migraines; may repeat in 1 hour if needed.     senna-docusate 8.6-50 MG per tablet  Commonly known as:  Senokot-S  Take 2 tablets by mouth 2 (two) times daily.     sertraline 100 MG tablet  Commonly known as:  ZOLOFT  Take 100 mg by mouth daily.          Diet and Activity recommendation: See Discharge Instructions above   Consults obtained -  none   Major procedures and Radiology Reports - PLEASE review detailed and final  reports for all details, in brief -      Ct Abdomen Pelvis Wo Contrast  09/27/2014   CLINICAL DATA:  Acute generalized abdominal pain.  EXAM: CT ABDOMEN AND PELVIS WITHOUT CONTRAST  TECHNIQUE: Multidetector CT imaging of the abdomen and pelvis was performed following the standard protocol without IV contrast.  COMPARISON:  CT scan of January 10, 2012.  FINDINGS: Visualized lung bases appear normal. No significant osseous abnormality is noted.  Status post cholecystectomy. No focal abnormality is noted in the liver, spleen or pancreas. Adrenal glands appear normal. Patient is status post ileal conduit placement and cystectomy. Minimal bilateral hydroureteronephrosis is noted without obstructing calculus. Large amount of stool is noted throughout the colon, with large amount of stool seen in the rectum concerning for rectal impaction. Uterus and ovaries appear normal. No significant adenopathy is noted. No abnormal fluid collection is noted.  IMPRESSION: Minimal bilateral hydroureteronephrosis is noted without obstructing calculus. Patient is status post ileal conduit placement and cystectomy.  Large amount of stool is seen throughout the colon and rectum concerning for rectal impaction and constipation.   Electronically Signed   By: Marijo Conception, M.D.   On: 09/27/2014 20:47   Dg Chest 2 View  09/13/2014   CLINICAL DATA:  Possible mass in the left chest wall. Vaginal discharge.  EXAM: CHEST  2 VIEW  COMPARISON:  PA and lateral chest 03/30/2013.  FINDINGS: Heart size and mediastinal contours are within normal limits. Both lungs are clear. Visualized skeletal structures are unremarkable.  IMPRESSION: Negative exam.   Electronically Signed   By: Inge Rise M.D.   On: 09/13/2014 18:39  Dg Abd Acute W/chest  09/27/2014   CLINICAL DATA:  Vomiting beginning at 6 p.m. 09/26/2014. Abdominal pain. Initial encounter.  EXAM: DG ABDOMEN ACUTE W/ 1V CHEST  COMPARISON:  PA and lateral chest 09/13/2014.   FINDINGS: Single view of the chest demonstrates clear lungs and normal heart size. No pneumothorax or pleural effusion.  Two views of the abdomen show no free intraperitoneal air. There is no evidence of bowel obstruction. The patient has a very large volume of stool throughout the colon. No abnormal abdominal calcification is seen. Cholecystectomy clips are noted.  IMPRESSION: No acute finding chest or abdomen.  Large colonic stool burden.   Electronically Signed   By: Inge Rise M.D.   On: 09/27/2014 17:27    Micro Results     No results found for this or any previous visit (from the past 240 hour(s)).     Today   Subjective:   Odell Casagrande today has no headache,no chest abdominal pain,no new weakness tingling or numbness, feels much better wants to go home today.  Objective:   Blood pressure 93/66, pulse 110, temperature 98.2 F (36.8 C), temperature source Oral, resp. rate 20, last menstrual period 08/29/2014, SpO2 95 %.   Intake/Output Summary (Last 24 hours) at 09/28/14 1132 Last data filed at 09/28/14 1031  Gross per 24 hour  Intake    485 ml  Output    125 ml  Net    360 ml    Exam Awake Alert, Oriented x 3, , Normal affect [place].AT,PERRAL Supple Neck,No JVD, No cervical lymphadenopathy appriciated.  Symmetrical Chest wall movement, Good air movement bilaterally, CTAB RRR,No Gallops,Rubs or new Murmurs, No Parasternal Heave +ve B.Sounds, Abd Soft, Non tender, No organomegaly appriciated, No rebound -guarding or rigidity. Has urostomy bag. No Cyanosis, Clubbing or edema, No new Rash or bruise  Data Review   CBC w Diff: Lab Results  Component Value Date   WBC 17.5* 09/27/2014   HGB 13.2 09/27/2014   HCT 38.7 09/27/2014   PLT 271 09/27/2014   LYMPHOPCT 11* 09/27/2014   MONOPCT 5 09/27/2014   EOSPCT 1 09/27/2014   BASOPCT 0 09/27/2014    CMP: Lab Results  Component Value Date   NA 141 09/27/2014   K 3.3* 09/27/2014   CL 108 09/27/2014   CO2 21*  09/27/2014   BUN 17 09/27/2014   CREATININE 0.53 09/27/2014   PROT 7.7 09/27/2014   ALBUMIN 4.5 09/27/2014   BILITOT 1.1 09/27/2014   ALKPHOS 88 09/27/2014   AST 25 09/27/2014   ALT 18 09/27/2014  .   Total Time in preparing paper work, data evaluation and todays exam - 35 minutes  Kayron Hicklin M.D on 09/28/2014 at 11:32 AM  [facility]   Office  [phone]

## 2014-10-03 LAB — CULTURE, BLOOD (ROUTINE X 2)
Culture: NO GROWTH
Culture: NO GROWTH

## 2014-10-27 ENCOUNTER — Encounter (HOSPITAL_COMMUNITY): Payer: Self-pay | Admitting: *Deleted

## 2014-10-27 ENCOUNTER — Emergency Department (HOSPITAL_COMMUNITY)
Admission: EM | Admit: 2014-10-27 | Discharge: 2014-10-27 | Disposition: A | Discharge status: Home / Self Care | Payer: Medicaid Other | Attending: Emergency Medicine | Admitting: Emergency Medicine

## 2014-10-27 ENCOUNTER — Emergency Department (HOSPITAL_COMMUNITY): Payer: Medicaid Other

## 2014-10-27 DIAGNOSIS — K5900 Constipation, unspecified: Secondary | ICD-10-CM | POA: Diagnosis not present

## 2014-10-27 DIAGNOSIS — K219 Gastro-esophageal reflux disease without esophagitis: Secondary | ICD-10-CM | POA: Insufficient documentation

## 2014-10-27 DIAGNOSIS — F4310 Post-traumatic stress disorder, unspecified: Secondary | ICD-10-CM | POA: Insufficient documentation

## 2014-10-27 DIAGNOSIS — Z7951 Long term (current) use of inhaled steroids: Secondary | ICD-10-CM | POA: Diagnosis not present

## 2014-10-27 DIAGNOSIS — F329 Major depressive disorder, single episode, unspecified: Secondary | ICD-10-CM | POA: Insufficient documentation

## 2014-10-27 DIAGNOSIS — Z3202 Encounter for pregnancy test, result negative: Secondary | ICD-10-CM | POA: Diagnosis not present

## 2014-10-27 DIAGNOSIS — Z79899 Other long term (current) drug therapy: Secondary | ICD-10-CM | POA: Diagnosis not present

## 2014-10-27 DIAGNOSIS — Z8669 Personal history of other diseases of the nervous system and sense organs: Secondary | ICD-10-CM | POA: Diagnosis not present

## 2014-10-27 DIAGNOSIS — Z91040 Latex allergy status: Secondary | ICD-10-CM | POA: Diagnosis not present

## 2014-10-27 DIAGNOSIS — M629 Disorder of muscle, unspecified: Secondary | ICD-10-CM | POA: Diagnosis not present

## 2014-10-27 DIAGNOSIS — Z87448 Personal history of other diseases of urinary system: Secondary | ICD-10-CM | POA: Insufficient documentation

## 2014-10-27 DIAGNOSIS — Z9049 Acquired absence of other specified parts of digestive tract: Secondary | ICD-10-CM | POA: Insufficient documentation

## 2014-10-27 LAB — CBC WITH DIFFERENTIAL/PLATELET
Basophils Absolute: 0 10*3/uL (ref 0.0–0.1)
Basophils Relative: 0 % (ref 0–1)
EOS ABS: 0.1 10*3/uL (ref 0.0–0.7)
EOS PCT: 2 % (ref 0–5)
HCT: 40 % (ref 36.0–46.0)
HEMOGLOBIN: 12.9 g/dL (ref 12.0–15.0)
Lymphocytes Relative: 17 % (ref 12–46)
Lymphs Abs: 1.5 10*3/uL (ref 0.7–4.0)
MCH: 26.6 pg (ref 26.0–34.0)
MCHC: 32.3 g/dL (ref 30.0–36.0)
MCV: 82.5 fL (ref 78.0–100.0)
MONO ABS: 0.4 10*3/uL (ref 0.1–1.0)
Monocytes Relative: 4 % (ref 3–12)
NEUTROS PCT: 77 % (ref 43–77)
Neutro Abs: 7.1 10*3/uL (ref 1.7–7.7)
Platelets: 197 10*3/uL (ref 150–400)
RBC: 4.85 MIL/uL (ref 3.87–5.11)
RDW: 14.6 % (ref 11.5–15.5)
WBC: 9.1 10*3/uL (ref 4.0–10.5)

## 2014-10-27 LAB — COMPREHENSIVE METABOLIC PANEL
ALT: 15 U/L (ref 14–54)
AST: 19 U/L (ref 15–41)
Albumin: 4.1 g/dL (ref 3.5–5.0)
Alkaline Phosphatase: 100 U/L (ref 38–126)
Anion gap: 8 (ref 5–15)
BUN: 11 mg/dL (ref 6–20)
CALCIUM: 8.9 mg/dL (ref 8.9–10.3)
CO2: 27 mmol/L (ref 22–32)
Chloride: 99 mmol/L — ABNORMAL LOW (ref 101–111)
Creatinine, Ser: 0.38 mg/dL — ABNORMAL LOW (ref 0.44–1.00)
GFR calc Af Amer: >60 mL/min (ref >60)
GFR calc non Af Amer: >60 mL/min (ref >60)
GLUCOSE: 82 mg/dL (ref 65–99)
Potassium: 4.3 mmol/L (ref 3.5–5.1)
SODIUM: 134 mmol/L — AB (ref 135–145)
Total Bilirubin: 0.9 mg/dL (ref 0.3–1.2)
Total Protein: 7 g/dL (ref 6.5–8.1)

## 2014-10-27 LAB — POC URINE PREG, ED: Preg Test, Ur: NEGATIVE

## 2014-10-27 MED ORDER — PEG 3350-KCL-NABCB-NACL-NASULF 236 G PO SOLR: 4.0000 L | Freq: Once | ORAL | Status: DC

## 2014-10-27 NOTE — ED Provider Notes (Addendum)
CSN: [id number]     Arrival date & time 10/27/14  1431 History   First MD Initiated Contact with Patient 10/27/14 1737     Chief Complaint  Patient presents with  . Constipation     (Consider location/radiation/quality/duration/timing/severity/associated sxs/prior Treatment) HPI  34 year old female with a history of multiple sclerosis and transverse myelitis as well as chronic constipation presents with constipation for the past 17 days. States she had a very small hard bowel movement a few days ago but otherwise has had nothing. This is a recurrent issue. One month ago she was admitted for obstipation. Patient has not had any nausea or vomiting but is complaining of left-sided abdominal pain. No fevers. She has tried multiple treatments including fiber, enemas, and mag citrate twice. Most recently was last night at 4 PM. No productive bowel movements since. Called her gastroenterologist, Dr. Oletta Lamas Sadie Haber), who recommended she come into the ER and would likely need to be admitted.  Past Medical History  Diagnosis Date  . Multiple sclerosis   . Depression   . PTSD (post-traumatic stress disorder)   . Paraplegia   . Transverse myelitis   . Bladder stones     Hx of  . GERD (gastroesophageal reflux disease)    Past Surgical History  Procedure Laterality Date  . Tonsillectomy    . Cholecystectomy    . Cystectomy w/ ureteroileal conduit    . Wisdom tooth extraction     No family history on file. History  Substance Use Topics  . Smoking status: Never Smoker   . Smokeless tobacco: Never Used  . Alcohol Use: No   OB History    No data available     Review of Systems  Constitutional: Negative for fever.  Gastrointestinal: Positive for abdominal pain, constipation and abdominal distention. Negative for nausea and vomiting.  All other systems reviewed and are negative.     Allergies  Other; Aspirin; Iodine; Latex; Shellfish-derived products; Strawberry; and Vancomycin  Home  Medications   Prior to Admission medications   Medication Sig Start Date End Date Taking? Authorizing Provider  baclofen (LIORESAL) 20 MG tablet Take 20 mg by mouth 3 (three) times daily.   Yes Historical Provider, MD  diazepam (VALIUM) 5 MG tablet Take 1-2 tablets (5-10 mg total) by mouth every 8 (eight) hours. Take 1 tablet in AM, 1 tablet in PM, and 2 tablets at bedtime 04/02/13  Yes Robbie Lis, MD  fluticasone [facility]) 50 MCG/ACT nasal spray Place 2 sprays into the nose daily.   Yes Historical Provider, MD  gabapentin (NEURONTIN) 400 MG capsule Take 800 mg by mouth 3 (three) times daily. 01/15/14  Yes Historical Provider, MD  ibuprofen (ADVIL,MOTRIN) 200 MG tablet Take 400 mg by mouth every 6 (six) hours as needed. For pain   Yes Historical Provider, MD  lamoTRIgine (LAMICTAL) 25 MG tablet Take 125 mg by mouth daily.   Yes Historical Provider, MD  loratadine (CLARITIN) 10 MG tablet Take 10 mg by mouth daily.   Yes Historical Provider, MD  lubiprostone (AMITIZA) 24 MCG capsule Take 24 mcg by mouth 2 (two) times daily with a meal.   Yes Historical Provider, MD  magnesium citrate SOLN Take 148 mLs (0.5 Bottles total) by mouth as needed for severe constipation. Patient taking differently: Take 1 Bottle by mouth as needed for severe constipation.  09/28/14  Yes Silver Huguenin Elgergawy, MD  omeprazole (PRILOSEC) 40 MG capsule Take 40 mg by mouth daily.   Yes Historical Provider, MD  ondansetron (ZOFRAN) 8 MG tablet Take 8 mg by mouth every 8 (eight) hours as needed. For nausea   Yes Historical Provider, MD  rizatriptan (MAXALT-MLT) 10 MG disintegrating tablet Take 10 mg by mouth as needed. For migraines; may repeat in 1 hour if needed.   Yes Historical Provider, MD  sertraline (ZOLOFT) 100 MG tablet Take 100 mg by mouth daily.   Yes Historical Provider, MD  Sod Citrate-Citric Acid (CYTRA-2 PO) Take 30 mLs by mouth at bedtime.   Yes Historical Provider, MD  Wheat Dextrin (BENEFIBER DRINK MIX PO) Take 2  scoop by mouth daily.   Yes Historical Provider, MD  HYDROcodone-acetaminophen (NORCO/VICODIN) 5-325 MG per tablet Take 1-2 tablets by mouth every 6 (six) hours as needed. Patient not taking: Reported on 10/27/2014 09/13/14   Montine Circle, PA-C   BP 94/49 mmHg  Pulse 86  Temp(Src) 98 F (36.7 C) (Oral)  Resp 18  SpO2 98%  LMP 09/17/2014 Physical Exam  Constitutional: She is oriented to person, place, and time. She appears well-developed and well-nourished.  HENT:  Head: Normocephalic and atraumatic.  Right Ear: External ear normal.  Left Ear: External ear normal.  Nose: Nose normal.  Eyes: Right eye exhibits no discharge. Left eye exhibits no discharge.  Cardiovascular: Normal rate, regular rhythm and normal heart sounds.   Pulmonary/Chest: Effort normal and breath sounds normal.  Abdominal: Soft. She exhibits no distension. There is tenderness (mild) in the left upper quadrant and left lower quadrant. There is no rigidity and no guarding.  Genitourinary:  Nurse chaperone present for exam. Large amount of soft stool in her diaper. Rectal exam shows soft stool but no impaction  Neurological: She is alert and oriented to person, place, and time. She exhibits abnormal muscle tone.  Skin: Skin is warm and dry. She is not diaphoretic.  Nursing note and vitals reviewed.   ED Course  Procedures (including critical care time) Labs Review Labs Reviewed  COMPREHENSIVE METABOLIC PANEL - Abnormal; Notable for the following:    Sodium 134 (*)    Chloride 99 (*)    Creatinine, Ser 0.38 (*)    All other components within normal limits  CBC WITH DIFFERENTIAL/PLATELET  POC URINE PREG, ED    Imaging Review Dg Abd 1 View  10/27/2014   CLINICAL DATA:  Constipation for 16 days  EXAM: ABDOMEN - 1 VIEW  COMPARISON:  CT abdomen and pelvis September 27, 2014  FINDINGS: There is diffuse stool throughout the colon and rectum. There is no bowel dilatation or air-fluid level suggesting obstruction. No  free air is seen on this supine examination. There are surgical clips the right upper quadrant. There is narrowing of each hip joint.  IMPRESSION: Diffuse stool throughout colon and rectum consistent with constipation. No obstruction or free air.   Electronically Signed   By: Lowella Grip III M.D.   On: 10/27/2014 20:15     EKG Interpretation None      MDM   Final diagnoses:  Constipation, unspecified constipation type    Discussed case with the GI on-call, Dr. Paulita Fujita (Dr. Oletta Lamas unavailable) who states the patient should be cleared to be able to go back home as long as she is not having nausea or vomiting and can tolerate oral bowel cleanout from go slightly. Patient has significant constipation but is currently having bowel movements, has had 2 soft bowel movements in the ER. There are no signs of obstruction. She has not had vomiting at any point during these  2 weeks and appears well. Her long discussion with mom and patient they had decided they want to go home and do cleanout at home. Mom appears frustrated given the patient is currently having recurrent constipation and no one has "figured out why". Likely this is a neurologic issue with her severe MS. I discussed the importance of following up closely with her GI doctor and have discussed strict return precautions.    Sherwood Gambler, MD 10/27/14 3220  Sherwood Gambler, MD 10/27/14 [phone]

## 2014-10-27 NOTE — ED Notes (Signed)
Questions r/t dc were answered by provider. Pt a&ox4

## 2014-10-27 NOTE — ED Notes (Signed)
Pt complains of constipation for the past 16 days. Pt states she was admitted for bowel obstruction earlier this month. Pt states she has not had a bowel movement since she was discharged from the hospital. Pt complains of 6/10 left sided abdominal pain. Pt has a hx of paraplegia and ms. Pt states her doctors are concerned her bowel is becoming paralyzed.

## 2014-10-27 NOTE — Discharge Instructions (Signed)

## 2014-10-27 NOTE — ED Notes (Signed)
MD at bedside. Dr. Regenia Skeeter at bedside.

## 2014-10-27 NOTE — ED Notes (Signed)
Provider request RN to hold off on instructions for now, will f/u

## 2014-10-27 NOTE — ED Notes (Signed)
Made an attempt to collect labs,unable to collect them pt appeared to be dehydrated

## 2014-10-29 ENCOUNTER — Emergency Department (HOSPITAL_COMMUNITY): Payer: Medicaid Other

## 2014-10-29 ENCOUNTER — Inpatient Hospital Stay (HOSPITAL_COMMUNITY)
Admission: EM | Admit: 2014-10-29 | Discharge: 2014-10-31 | DRG: 392 | Disposition: A | Discharge status: Home / Self Care | Payer: Medicaid Other | Attending: Internal Medicine | Admitting: Internal Medicine

## 2014-10-29 ENCOUNTER — Encounter (HOSPITAL_COMMUNITY): Payer: Self-pay

## 2014-10-29 DIAGNOSIS — K5909 Other constipation: Principal | ICD-10-CM | POA: Diagnosis present

## 2014-10-29 DIAGNOSIS — Z803 Family history of malignant neoplasm of breast: Secondary | ICD-10-CM

## 2014-10-29 DIAGNOSIS — Z7401 Bed confinement status: Secondary | ICD-10-CM

## 2014-10-29 DIAGNOSIS — Z906 Acquired absence of other parts of urinary tract: Secondary | ICD-10-CM | POA: Diagnosis present

## 2014-10-29 DIAGNOSIS — Z833 Family history of diabetes mellitus: Secondary | ICD-10-CM

## 2014-10-29 DIAGNOSIS — K5900 Constipation, unspecified: Secondary | ICD-10-CM | POA: Diagnosis present

## 2014-10-29 DIAGNOSIS — Z936 Other artificial openings of urinary tract status: Secondary | ICD-10-CM

## 2014-10-29 DIAGNOSIS — Z79899 Other long term (current) drug therapy: Secondary | ICD-10-CM | POA: Diagnosis not present

## 2014-10-29 DIAGNOSIS — Z933 Colostomy status: Secondary | ICD-10-CM | POA: Diagnosis not present

## 2014-10-29 DIAGNOSIS — G8220 Paraplegia, unspecified: Secondary | ICD-10-CM | POA: Diagnosis present

## 2014-10-29 DIAGNOSIS — K567 Ileus, unspecified: Secondary | ICD-10-CM | POA: Diagnosis present

## 2014-10-29 DIAGNOSIS — F329 Major depressive disorder, single episode, unspecified: Secondary | ICD-10-CM | POA: Diagnosis present

## 2014-10-29 DIAGNOSIS — G35D Multiple sclerosis, unspecified: Secondary | ICD-10-CM | POA: Diagnosis present

## 2014-10-29 DIAGNOSIS — G35 Multiple sclerosis: Secondary | ICD-10-CM | POA: Diagnosis present

## 2014-10-29 DIAGNOSIS — K219 Gastro-esophageal reflux disease without esophagitis: Secondary | ICD-10-CM | POA: Diagnosis present

## 2014-10-29 DIAGNOSIS — F4310 Post-traumatic stress disorder, unspecified: Secondary | ICD-10-CM | POA: Diagnosis present

## 2014-10-29 DIAGNOSIS — Z8249 Family history of ischemic heart disease and other diseases of the circulatory system: Secondary | ICD-10-CM

## 2014-10-29 LAB — URINALYSIS, ROUTINE W REFLEX MICROSCOPIC
Bilirubin Urine: NEGATIVE
GLUCOSE, UA: NEGATIVE mg/dL
KETONES UR: NEGATIVE mg/dL
Nitrite: NEGATIVE
Protein, ur: 30 mg/dL — AB
Specific Gravity, Urine: 1.016 (ref 1.005–1.030)
Urobilinogen, UA: 2 mg/dL — ABNORMAL HIGH (ref 0.0–1.0)
pH: 8.5 — ABNORMAL HIGH (ref 5.0–8.0)

## 2014-10-29 LAB — CBC WITH DIFFERENTIAL/PLATELET
Basophils Absolute: 0 10*3/uL (ref 0.0–0.1)
Basophils Relative: 0 % (ref 0–1)
EOS ABS: 0 10*3/uL (ref 0.0–0.7)
Eosinophils Relative: 0 % (ref 0–5)
HEMATOCRIT: 42.2 % (ref 36.0–46.0)
Hemoglobin: 13.6 g/dL (ref 12.0–15.0)
LYMPHS ABS: 1.3 10*3/uL (ref 0.7–4.0)
Lymphocytes Relative: 20 % (ref 12–46)
MCH: 26.8 pg (ref 26.0–34.0)
MCHC: 32.2 g/dL (ref 30.0–36.0)
MCV: 83.2 fL (ref 78.0–100.0)
MONO ABS: 0.2 10*3/uL (ref 0.1–1.0)
MONOS PCT: 3 % (ref 3–12)
NEUTROS PCT: 77 % (ref 43–77)
Neutro Abs: 4.8 10*3/uL (ref 1.7–7.7)
Platelets: 219 10*3/uL (ref 150–400)
RBC: 5.07 MIL/uL (ref 3.87–5.11)
RDW: 14.6 % (ref 11.5–15.5)
WBC: 6.3 10*3/uL (ref 4.0–10.5)

## 2014-10-29 LAB — I-STAT CHEM 8, ED
BUN: 8 mg/dL (ref 6–20)
Calcium, Ion: 1.12 mmol/L (ref 1.12–1.23)
Chloride: 101 mmol/L (ref 101–111)
Creatinine, Ser: 0.5 mg/dL (ref 0.44–1.00)
Glucose, Bld: 85 mg/dL (ref 65–99)
HCT: 45 % (ref 36.0–46.0)
HEMOGLOBIN: 15.3 g/dL — AB (ref 12.0–15.0)
Potassium: 3.9 mmol/L (ref 3.5–5.1)
SODIUM: 138 mmol/L (ref 135–145)
TCO2: 22 mmol/L (ref 0–100)

## 2014-10-29 LAB — URINE MICROSCOPIC-ADD ON

## 2014-10-29 MED ORDER — ONDANSETRON HCL 4 MG/2ML IJ SOLN
4.0000 mg | Freq: Four times a day (QID) | INTRAMUSCULAR | Status: DC | PRN
  Administered 2014-10-30 (×2): 4 mg via INTRAVENOUS
  Filled 2014-10-29 (×2): qty 2

## 2014-10-29 MED ORDER — SODIUM CHLORIDE 0.9 % IV SOLN
INTRAVENOUS | Status: AC
  Administered 2014-10-30: 01:00:00 via INTRAVENOUS

## 2014-10-29 MED ORDER — ONDANSETRON HCL 4 MG PO TABS: 4.0000 mg | ORAL_TABLET | Freq: Four times a day (QID) | ORAL | Status: DC | PRN

## 2014-10-29 MED ORDER — DOCUSATE SODIUM 100 MG PO CAPS
100.0000 mg | ORAL_CAPSULE | Freq: Two times a day (BID) | ORAL | Status: DC
  Administered 2014-10-29 – 2014-10-31 (×3): 100 mg via ORAL
  Filled 2014-10-29 (×6): qty 1

## 2014-10-29 MED ORDER — DIAZEPAM 5 MG PO TABS
10.0000 mg | ORAL_TABLET | Freq: Every day | ORAL | Status: DC
  Administered 2014-10-29 – 2014-10-30 (×2): 10 mg via ORAL
  Filled 2014-10-29 (×2): qty 2

## 2014-10-29 MED ORDER — FLEET ENEMA 7-19 GM/118ML RE ENEM
ENEMA | RECTAL | Status: AC
Start: 2014-10-29 — End: 2014-10-29
  Administered 2014-10-29
  Filled 2014-10-29: qty 1

## 2014-10-29 MED ORDER — BACLOFEN 20 MG PO TABS
20.0000 mg | ORAL_TABLET | Freq: Three times a day (TID) | ORAL | Status: DC
  Administered 2014-10-29 – 2014-10-31 (×5): 20 mg via ORAL
  Filled 2014-10-29 (×7): qty 1

## 2014-10-29 MED ORDER — FLEET ENEMA 7-19 GM/118ML RE ENEM
1.0000 | ENEMA | Freq: Once | RECTAL | Status: AC | PRN
  Administered 2014-10-29: 1 via RECTAL

## 2014-10-29 MED ORDER — POLYETHYLENE GLYCOL 3350 17 G PO PACK
17.0000 g | PACK | Freq: Two times a day (BID) | ORAL | Status: DC
  Filled 2014-10-29 (×2): qty 1

## 2014-10-29 MED ORDER — DIAZEPAM 5 MG PO TABS: 5.0000 mg | ORAL_TABLET | Freq: Three times a day (TID) | ORAL | Status: DC

## 2014-10-29 MED ORDER — GABAPENTIN 400 MG PO CAPS
800.0000 mg | ORAL_CAPSULE | Freq: Three times a day (TID) | ORAL | Status: DC
  Administered 2014-10-29 – 2014-10-31 (×5): 800 mg via ORAL
  Filled 2014-10-29 (×7): qty 2

## 2014-10-29 MED ORDER — LUBIPROSTONE 24 MCG PO CAPS
24.0000 ug | ORAL_CAPSULE | Freq: Two times a day (BID) | ORAL | Status: DC
  Administered 2014-10-30 – 2014-10-31 (×3): 24 ug via ORAL
  Filled 2014-10-29 (×5): qty 1

## 2014-10-29 MED ORDER — LAMOTRIGINE 25 MG PO TABS
125.0000 mg | ORAL_TABLET | Freq: Every day | ORAL | Status: DC
  Administered 2014-10-29 – 2014-10-31 (×3): 125 mg via ORAL
  Filled 2014-10-29 (×3): qty 1

## 2014-10-29 MED ORDER — BISACODYL 10 MG RE SUPP: 10.0000 mg | Freq: Every day | RECTAL | Status: DC | PRN

## 2014-10-29 MED ORDER — METOCLOPRAMIDE HCL 5 MG/ML IJ SOLN
5.0000 mg | Freq: Four times a day (QID) | INTRAMUSCULAR | Status: DC
  Administered 2014-10-30 – 2014-10-31 (×7): 5 mg via INTRAVENOUS
  Filled 2014-10-29 (×3): qty 1
  Filled 2014-10-29: qty 2
  Filled 2014-10-29 (×2): qty 1
  Filled 2014-10-29: qty 2
  Filled 2014-10-29: qty 1
  Filled 2014-10-29: qty 2
  Filled 2014-10-29 (×2): qty 1

## 2014-10-29 MED ORDER — FLUTICASONE PROPIONATE 50 MCG/ACT NA SUSP
2.0000 | Freq: Every day | NASAL | Status: DC
  Administered 2014-10-29 – 2014-10-31 (×3): 2 via NASAL
  Filled 2014-10-29: qty 16

## 2014-10-29 MED ORDER — DIAZEPAM 5 MG PO TABS
5.0000 mg | ORAL_TABLET | Freq: Two times a day (BID) | ORAL | Status: DC
  Administered 2014-10-30 – 2014-10-31 (×3): 5 mg via ORAL
  Filled 2014-10-29 (×3): qty 1

## 2014-10-29 MED ORDER — ACETAMINOPHEN 325 MG PO TABS
650.0000 mg | ORAL_TABLET | Freq: Four times a day (QID) | ORAL | Status: DC | PRN
  Administered 2014-10-30: 650 mg via ORAL
  Filled 2014-10-29: qty 2

## 2014-10-29 MED ORDER — ACETAMINOPHEN 650 MG RE SUPP: 650.0000 mg | Freq: Four times a day (QID) | RECTAL | Status: DC | PRN

## 2014-10-29 MED ORDER — ENOXAPARIN SODIUM 40 MG/0.4ML ~~LOC~~ SOLN
40.0000 mg | SUBCUTANEOUS | Status: DC
  Administered 2014-10-29 – 2014-10-30 (×2): 40 mg via SUBCUTANEOUS
  Filled 2014-10-29 (×3): qty 0.4

## 2014-10-29 MED ORDER — SERTRALINE HCL 100 MG PO TABS
100.0000 mg | ORAL_TABLET | Freq: Every day | ORAL | Status: DC
  Administered 2014-10-29 – 2014-10-31 (×3): 100 mg via ORAL
  Filled 2014-10-29 (×3): qty 1

## 2014-10-29 MED ORDER — PANTOPRAZOLE SODIUM 40 MG PO TBEC
40.0000 mg | DELAYED_RELEASE_TABLET | Freq: Every day | ORAL | Status: DC
  Administered 2014-10-29 – 2014-10-31 (×3): 40 mg via ORAL
  Filled 2014-10-29 (×4): qty 1

## 2014-10-29 MED ORDER — HYDROCODONE-ACETAMINOPHEN 5-325 MG PO TABS: 1.0000 | ORAL_TABLET | ORAL | Status: DC | PRN

## 2014-10-29 MED ORDER — LORATADINE 10 MG PO TABS
10.0000 mg | ORAL_TABLET | Freq: Every day | ORAL | Status: DC
  Administered 2014-10-29 – 2014-10-31 (×3): 10 mg via ORAL
  Filled 2014-10-29 (×3): qty 1

## 2014-10-29 NOTE — ED Notes (Signed)
Writer unsuccessful on blood draw attempt

## 2014-10-29 NOTE — ED Notes (Signed)
MD at bedside. 

## 2014-10-29 NOTE — H&P (Addendum)
PCP: Simona Huh, MD  Charolotte Capuchin  Referring provider [place]   Chief Complaint:  constipation  HPI: [name] is a 34 y.o. female   has a past medical history of Multiple sclerosis; Depression; PTSD (post-traumatic stress disorder); Paraplegia; Transverse myelitis; Bladder stones; and GERD (gastroesophageal reflux disease).   Presented with persistent constipation. Unable to have a significant BM for the past 16 days. Family called GI they attempted Golytely, Mag citrate and Saline enema at home with no relief. Patient had an admission on 28th of June for the same at that time she has undergone disimpaction in the emergency department. On presentation to emerge department patient was given soapsuds enema producing moderate amount of stool. Patient is currently feeling better but still have abdominal distention. [place] showed mild ileus.  Patient reports low grade fever up to 99. Family states no foul urine. She has hx of cystectomy with ureteroileal conduit. She has persistent bacteria in the urine.   Patient with known diagnosis of multiple sclerosis currently bedbound. Has decreased activity which has probably contributed to progression of constipation. Family states that even prior to diagnosis of multiple sclerosis patient usually had large stools that were difficult to pass.  Hospitalist was called for admission for Ileus.   Review of Systems:    Pertinent positives include:  constipation  Constitutional:  No weight loss, night sweats, Fevers, chills, fatigue, weight loss  HEENT:  No headaches, Difficulty swallowing,Tooth/dental problems,Sore throat,  No sneezing, itching, ear ache, nasal congestion, post nasal drip,  Cardio-vascular:  No chest pain, Orthopnea, PND, anasarca, dizziness, palpitations.no Bilateral lower extremity swelling  GI:  No heartburn, indigestion, abdominal pain, nausea, vomiting, diarrhea, change in bowel habits, loss of appetite, melena,  blood in stool, hematemesis Resp:  no shortness of breath at rest. No dyspnea on exertion, No excess mucus, no productive cough, No non-productive cough, No coughing up of blood.No change in color of mucus.No wheezing. Skin:  no rash or lesions. No jaundice GU:  no dysuria, change in color of urine, no urgency or frequency. No straining to urinate.  No flank pain.  Musculoskeletal:  No joint pain or no joint swelling. No decreased range of motion. No back pain.  Psych:  No change in mood or affect. No depression or anxiety. No memory loss.  Neuro: no localizing neurological complaints, no tingling, no weakness, no double vision, no gait abnormality, no slurred speech, no confusion  Otherwise ROS are negative except for above, 10 systems were reviewed  Past Medical History: Past Medical History  Diagnosis Date  . Multiple sclerosis   . Depression   . PTSD (post-traumatic stress disorder)   . Paraplegia   . Transverse myelitis   . Bladder stones     Hx of  . GERD (gastroesophageal reflux disease)    Past Surgical History  Procedure Laterality Date  . Tonsillectomy    . Cholecystectomy    . Cystectomy w/ ureteroileal conduit    . Wisdom tooth extraction       Medications: Prior to Admission medications   Medication Sig Start Date End Date Taking? Authorizing Provider  baclofen (LIORESAL) 20 MG tablet Take 20 mg by mouth 3 (three) times daily.   Yes Historical Provider, MD  diazepam (VALIUM) 5 MG tablet Take 1-2 tablets (5-10 mg total) by mouth every 8 (eight) hours. Take 1 tablet in AM, 1 tablet in PM, and 2 tablets at bedtime 04/02/13  Yes Robbie Lis, MD  fluticasone [facility]) 50 MCG/ACT  nasal spray Place 2 sprays into the nose daily as needed for allergies.    Yes Historical Provider, MD  gabapentin (NEURONTIN) 400 MG capsule Take 800 mg by mouth 3 (three) times daily. 01/15/14  Yes Historical Provider, MD  ibuprofen (ADVIL,MOTRIN) 200 MG tablet Take 400 mg by mouth every  6 (six) hours as needed for headache, mild pain or moderate pain.    Yes Historical Provider, MD  lamoTRIgine (LAMICTAL) 25 MG tablet Take 125 mg by mouth daily.   Yes Historical Provider, MD  loratadine (CLARITIN) 10 MG tablet Take 10 mg by mouth daily.   Yes Historical Provider, MD  lubiprostone (AMITIZA) 24 MCG capsule Take 24 mcg by mouth 2 (two) times daily with a meal.   Yes Historical Provider, MD  magnesium citrate SOLN Take 148 mLs (0.5 Bottles total) by mouth as needed for severe constipation. Patient taking differently: Take 1 Bottle by mouth as needed for severe constipation.  09/28/14  Yes Silver Huguenin Elgergawy, MD  omeprazole (PRILOSEC) 40 MG capsule Take 40 mg by mouth daily.   Yes Historical Provider, MD  ondansetron (ZOFRAN) 8 MG tablet Take 8 mg by mouth every 8 (eight) hours as needed for nausea or vomiting.    Yes Historical Provider, MD  polyethylene glycol (GOLYTELY) 236 G solution Take 4,000 mLs by mouth once. Drink the 1 gallon of golytely over 24 hours. 10/27/14  Yes Sherwood Gambler, MD  rizatriptan (MAXALT-MLT) 10 MG disintegrating tablet Take 10 mg by mouth as needed. For migraines; may repeat in 1 hour if needed.   Yes Historical Provider, MD  sertraline (ZOLOFT) 100 MG tablet Take 100 mg by mouth daily.   Yes Historical Provider, MD  Sod Citrate-Citric Acid (CYTRA-2 PO) Take 30 mLs by mouth at bedtime.   Yes Historical Provider, MD  Wheat Dextrin (BENEFIBER DRINK MIX PO) Take 2 scoop by mouth daily.   Yes Historical Provider, MD  HYDROcodone-acetaminophen (NORCO/VICODIN) 5-325 MG per tablet Take 1-2 tablets by mouth every 6 (six) hours as needed. Patient not taking: Reported on 10/27/2014 09/13/14   Montine Circle, PA-C    Allergies:   Allergies  Allergen Reactions  . Other Anaphylaxis    ALL -MYCINS  . Aspirin Other (See Comments)    Reaction unknown  . Iodine Other (See Comments)    Reaction unknown  . Latex Other (See Comments)    Reaction unknown  .  Shellfish-Derived Products Other (See Comments)    Reaction unknown  . Strawberry Hives  . Vancomycin Other (See Comments)    Reaction unknown    Social History:  Ambulatory   bed bound Lives at home With family     reports that she has never smoked. She has never used smokeless tobacco. She reports that she does not drink alcohol or use illicit drugs.    Family History: family history includes Breast cancer in her other; CAD in her father; Dementia in her father; Diabetes type II in her father, mother, and other; Hypertension in her mother.    Physical Exam: Patient Vitals for the past 24 hrs:  BP Temp Temp src Pulse Resp SpO2  10/29/14 1910 127/84 mmHg 97.8 F (36.6 C) Oral 76 16 99 %  10/29/14 1730 133/82 mmHg - - 76 16 97 %  10/29/14 1418 125/82 mmHg 98 F (36.7 C) Oral 73 16 95 %    1. General:  in No Acute distress 2. Psychological: Alert and  Oriented 3. Head/ENT:     Dry  Mucous Membranes                          Head Non traumatic, neck supple                            Poor Dentition 4. SKIN: normal  Skin turgor,  Skin clean Dry and intact no rash 5. Heart: Regular rate and rhythm no Murmur, Rub or gallop 6. Lungs: Clear to auscultation bilaterally, no wheezes or crackles   7. Abdomen: Soft, non-tender,   Distended bowel sounds present 8. Lower extremities: no clubbing, cyanosis, or edema 9. Neurologically generalized weakness, diminished strength in lower extremity upper extremity somewhat preserved 10. MSK: Normal range of motion  body mass index is unknown because there is no weight on file.   Labs on Admission:   Results for orders placed or performed during the hospital encounter of 10/29/14 (from the past 24 hour(s))  Urinalysis, Routine w reflex microscopic (not at [facility])     Status: Abnormal   Collection Time: 10/29/14  4:27 PM  Result Value Ref Range   Color, Urine YELLOW YELLOW   APPearance CLOUDY (A) CLEAR   Specific Gravity, Urine 1.016 1.005  - 1.030   pH 8.5 (H) 5.0 - 8.0   Glucose, UA NEGATIVE NEGATIVE mg/dL   Hgb urine dipstick MODERATE (A) NEGATIVE   Bilirubin Urine NEGATIVE NEGATIVE   Ketones, ur NEGATIVE NEGATIVE mg/dL   Protein, ur 30 (A) NEGATIVE mg/dL   Urobilinogen, UA 2.0 (H) 0.0 - 1.0 mg/dL   Nitrite NEGATIVE NEGATIVE   Leukocytes, UA TRACE (A) NEGATIVE  Urine microscopic-add on     Status: Abnormal   Collection Time: 10/29/14  4:27 PM  Result Value Ref Range   Squamous Epithelial / LPF FEW (A) RARE   WBC, UA 11-20 <3 WBC/hpf   RBC / HPF 3-6 <3 RBC/hpf   Bacteria, UA MANY (A) RARE  CBC with Differential     Status: None   Collection Time: 10/29/14  5:25 PM  Result Value Ref Range   WBC 6.3 4.0 - 10.5 K/uL   RBC 5.07 3.87 - 5.11 MIL/uL   Hemoglobin 13.6 12.0 - 15.0 g/dL   HCT 42.2 36.0 - 46.0 %   MCV 83.2 78.0 - 100.0 fL   MCH 26.8 26.0 - 34.0 pg   MCHC 32.2 30.0 - 36.0 g/dL   RDW 14.6 11.5 - 15.5 %   Platelets 219 150 - 400 K/uL   Neutrophils Relative % 77 43 - 77 %   Neutro Abs 4.8 1.7 - 7.7 K/uL   Lymphocytes Relative 20 12 - 46 %   Lymphs Abs 1.3 0.7 - 4.0 K/uL   Monocytes Relative 3 3 - 12 %   Monocytes Absolute 0.2 0.1 - 1.0 K/uL   Eosinophils Relative 0 0 - 5 %   Eosinophils Absolute 0.0 0.0 - 0.7 K/uL   Basophils Relative 0 0 - 1 %   Basophils Absolute 0.0 0.0 - 0.1 K/uL  I-stat Chem 8, ED     Status: Abnormal   Collection Time: 10/29/14  5:35 PM  Result Value Ref Range   Sodium 138 135 - 145 mmol/L   Potassium 3.9 3.5 - 5.1 mmol/L   Chloride 101 101 - 111 mmol/L   BUN 8 6 - 20 mg/dL   Creatinine, Ser 0.50 0.44 - 1.00 mg/dL   Glucose, Bld 85 65 -  99 mg/dL   Calcium, Ion 1.12 1.12 - 1.23 mmol/L   TCO2 22 0 - 100 mmol/L   Hemoglobin 15.3 (H) 12.0 - 15.0 g/dL   HCT 45.0 36.0 - 46.0 %    UA many bacteria 11-20 WBC  No results found for: HGBA1C  CrCl cannot be calculated (Unknown ideal weight.).  BNP (last 3 results) No results for input(s): PROBNP in the last 8760 hours.  Other  results:  I have pearsonaly reviewed this: ECG REPORT Not obtained   There were no vitals filed for this visit.   Cultures:    Component Value Date/Time   SDES BLOOD LEFT HAND 09/28/2014 0641   SPECREQUEST BOTTLES DRAWN AEROBIC ONLY 4CC 09/28/2014 0641   CULT  09/28/2014 0641    NO GROWTH 5 DAYS Performed at [facility] 10/03/2014 FINAL 09/28/2014 0641     Radiological Exams on Admission: Dg Abd 1 View  10/27/2014   CLINICAL DATA:  Constipation for 16 days  EXAM: ABDOMEN - 1 VIEW  COMPARISON:  CT abdomen and pelvis September 27, 2014  FINDINGS: There is diffuse stool throughout the colon and rectum. There is no bowel dilatation or air-fluid level suggesting obstruction. No free air is seen on this supine examination. There are surgical clips the right upper quadrant. There is narrowing of each hip joint.  IMPRESSION: Diffuse stool throughout colon and rectum consistent with constipation. No obstruction or free air.   Electronically Signed   By: Lowella Grip III M.D.   On: 10/27/2014 20:15   Dg Abd Acute W/chest  10/29/2014   CLINICAL DATA:  Constipation for 17 days. Nausea for 1 day. Fever for 1 day  EXAM: DG ABDOMEN ACUTE W/ 1V CHEST  COMPARISON:  Abdomen series September 27, 2014; CT abdomen and pelvis September 27, 2014  FINDINGS: PA chest: Lungs are clear. Heart size and pulmonary vascularity are normal. No adenopathy.  Supine and left lateral decubitus abdomen: There is diffuse stool throughout the colon. There is no appreciable bowel dilatation. There are scattered air-fluid levels on the decubitus image. No free air. No abnormal calcifications.  IMPRESSION: Diffuse stool throughout colon. Scattered air-fluid levels without bowel dilatation. Question early ileus or enteritis. Obstruction not felt to be likely. Lungs clear.   Electronically Signed   By: Lowella Grip III M.D.   On: 10/29/2014 17:04    Chart has been reviewed  Family  at  Bedside   plan of care  was discussed with Mother Kadi Hession [phone] home  Assessment/Plan  34 year old female history of paraplegia secondary to multiple sclerosis and transverse myelitis here with recurrent obstipation and now mild ileus. We'll need GI follow-up to help with his chronic symptoms. For now admit to treat for obstipation and ileus  Present on Admission:  . Paraplegia - chronic patient resides at home with help with her mother  . Multiple sclerosis chronic currently stable  . Obstipation - will need follow-up with GI to see what else can be done given his current ongoing problem. For now order a bowel regimen with enemas as needed. Repeat KUB in the morning  . Ileus somewhat improved. Appreciate bowel sounds. Will continue to monitor overnight. Clear diet, rehydrate give Reglan.  Bacteria in urine. We'll await results of urine culture for now hold off on antibiotics  Prophylaxis:  Lovenox   CODE STATUS:  FULL CODE  as per patient    Disposition  To home once workup is complete and  patient is stable  Other plan as per orders.  I have spent a total of 55 min on this admission  Zaydon Kinser 10/29/2014, 7:30 PM  [facility]  Pager [phone]   after 2 AM please page floor coverage PA If 7AM-7PM, please contact the day team taking care of the patient  [email or web address]  Password [email or web address]

## 2014-10-29 NOTE — ED Provider Notes (Signed)
CSN: [id number]     Arrival date & time 10/29/14  1405 History   First MD Initiated Contact with Patient 10/29/14 1510     Chief Complaint  Patient presents with  . Constipation  . Nausea  . Fever     (Consider location/radiation/quality/duration/timing/severity/associated sxs/prior Treatment) HPI Comments: Patient history of multiple sclerosis and transverse myelitis with paraplegia presents with constipation. She has a history of some chronic constipation but it's gotten markedly worse since June. She's been to the ED several times for constipation since June. She was admitted once for obstipation. She was seen here 2 days ago for constipation. She was discharged with GoLYTELY to use. The patient states she's been taking GoLYTELY since yesterday. She has not had a significant bowel movement with this. She's also tried enemas without success. She complains of abdominal bloating. She's had some nausea but no vomiting today. She did have some vomiting yesterday. She states her temperature has been a little bit elevated to 98. She denies any cough or chest congestion. She is also status post a cystectomy with a ureteroileal conduit in place.  Patient is a 34 y.o. female presenting with constipation and fever.  Constipation Associated symptoms: fever and nausea   Associated symptoms: no abdominal pain, no back pain, no diarrhea and no vomiting   Fever Associated symptoms: nausea   Associated symptoms: no chest pain, no chills, no congestion, no cough, no diarrhea, no headaches, no rash, no rhinorrhea and no vomiting     Past Medical History  Diagnosis Date  . Multiple sclerosis   . Depression   . PTSD (post-traumatic stress disorder)   . Paraplegia   . Transverse myelitis   . Bladder stones     Hx of  . GERD (gastroesophageal reflux disease)    Past Surgical History  Procedure Laterality Date  . Tonsillectomy    . Cholecystectomy    . Cystectomy w/ ureteroileal conduit    . Wisdom  tooth extraction     History reviewed. No pertinent family history. History  Substance Use Topics  . Smoking status: Never Smoker   . Smokeless tobacco: Never Used  . Alcohol Use: No   OB History    No data available     Review of Systems  Constitutional: Positive for fever. Negative for chills, diaphoresis and fatigue.  HENT: Negative for congestion, rhinorrhea and sneezing.   Eyes: Negative.   Respiratory: Negative for cough, chest tightness and shortness of breath.   Cardiovascular: Negative for chest pain and leg swelling.  Gastrointestinal: Positive for nausea and constipation. Negative for vomiting, abdominal pain, diarrhea and blood in stool.  Genitourinary: Negative for frequency, hematuria, flank pain and difficulty urinating.  Musculoskeletal: Negative for back pain and arthralgias.  Skin: Negative for rash.  Neurological: Negative for dizziness, speech difficulty, weakness, numbness and headaches.      Allergies  Other; Aspirin; Iodine; Latex; Shellfish-derived products; Strawberry; and Vancomycin  Home Medications   Prior to Admission medications   Medication Sig Start Date End Date Taking? Authorizing Provider  baclofen (LIORESAL) 20 MG tablet Take 20 mg by mouth 3 (three) times daily.   Yes Historical Provider, MD  diazepam (VALIUM) 5 MG tablet Take 1-2 tablets (5-10 mg total) by mouth every 8 (eight) hours. Take 1 tablet in AM, 1 tablet in PM, and 2 tablets at bedtime 04/02/13  Yes Robbie Lis, MD  fluticasone [facility] 50 MCG/ACT nasal spray Place 2 sprays into the nose daily as needed for allergies.  Yes Historical Provider, MD  gabapentin (NEURONTIN) 400 MG capsule Take 800 mg by mouth 3 (three) times daily. 01/15/14  Yes Historical Provider, MD  ibuprofen (ADVIL,MOTRIN) 200 MG tablet Take 400 mg by mouth every 6 (six) hours as needed for headache, mild pain or moderate pain.    Yes Historical Provider, MD  lamoTRIgine (LAMICTAL) 25 MG tablet Take 125 mg by  mouth daily.   Yes Historical Provider, MD  loratadine (CLARITIN) 10 MG tablet Take 10 mg by mouth daily.   Yes Historical Provider, MD  lubiprostone (AMITIZA) 24 MCG capsule Take 24 mcg by mouth 2 (two) times daily with a meal.   Yes Historical Provider, MD  magnesium citrate SOLN Take 148 mLs (0.5 Bottles total) by mouth as needed for severe constipation. Patient taking differently: Take 1 Bottle by mouth as needed for severe constipation.  09/28/14  Yes Silver Huguenin Elgergawy, MD  omeprazole (PRILOSEC) 40 MG capsule Take 40 mg by mouth daily.   Yes Historical Provider, MD  ondansetron (ZOFRAN) 8 MG tablet Take 8 mg by mouth every 8 (eight) hours as needed for nausea or vomiting.    Yes Historical Provider, MD  polyethylene glycol (GOLYTELY) 236 G solution Take 4,000 mLs by mouth once. Drink the 1 gallon of golytely over 24 hours. 10/27/14  Yes Sherwood Gambler, MD  rizatriptan (MAXALT-MLT) 10 MG disintegrating tablet Take 10 mg by mouth as needed. For migraines; may repeat in 1 hour if needed.   Yes Historical Provider, MD  sertraline (ZOLOFT) 100 MG tablet Take 100 mg by mouth daily.   Yes Historical Provider, MD  Sod Citrate-Citric Acid (CYTRA-2 PO) Take 30 mLs by mouth at bedtime.   Yes Historical Provider, MD  Wheat Dextrin (BENEFIBER DRINK MIX PO) Take 2 scoop by mouth daily.   Yes Historical Provider, MD  HYDROcodone-acetaminophen (NORCO/VICODIN) 5-325 MG per tablet Take 1-2 tablets by mouth every 6 (six) hours as needed. Patient not taking: Reported on 10/27/2014 09/13/14   Montine Circle, PA-C   BP 125/82 mmHg  Pulse 73  Temp(Src) 98 F (36.7 C) (Oral)  Resp 16  SpO2 95%  LMP 10/15/2014 Physical Exam  Constitutional: She is oriented to person, place, and time. She appears well-developed and well-nourished.  HENT:  Head: Normocephalic and atraumatic.  Eyes: Pupils are equal, round, and reactive to light.  Neck: Normal range of motion. Neck supple.  Cardiovascular: Normal rate, regular  rhythm and normal heart sounds.   Pulmonary/Chest: Effort normal and breath sounds normal. No respiratory distress. She has no wheezes. She has no rales. She exhibits no tenderness.  Abdominal: Soft. Bowel sounds are normal. There is tenderness (Mild diffuse tenderness). There is no rebound and no guarding.  Urine bag in place with clear urine  Genitourinary:  Rectal exam shows soft brown stool, no impaction  Musculoskeletal: Normal range of motion. She exhibits no edema.  Lymphadenopathy:    She has no cervical adenopathy.  Neurological: She is alert and oriented to person, place, and time.  Skin: Skin is warm and dry. No rash noted.  Psychiatric: She has a normal mood and affect.    ED Course  Procedures (including critical care time) Labs Review Labs Reviewed  URINE CULTURE  CBC WITH DIFFERENTIAL/PLATELET  URINALYSIS, ROUTINE W REFLEX MICROSCOPIC (NOT AT [facility])  I-STAT CHEM 8, ED    Imaging Review Dg Abd 1 View  10/27/2014   CLINICAL DATA:  Constipation for 16 days  EXAM: ABDOMEN - 1 VIEW  COMPARISON:  CT abdomen and pelvis September 27, 2014  FINDINGS: There is diffuse stool throughout the colon and rectum. There is no bowel dilatation or air-fluid level suggesting obstruction. No free air is seen on this supine examination. There are surgical clips the right upper quadrant. There is narrowing of each hip joint.  IMPRESSION: Diffuse stool throughout colon and rectum consistent with constipation. No obstruction or free air.   Electronically Signed   By: Lowella Grip III M.D.   On: 10/27/2014 20:15     EKG Interpretation None      MDM   Final diagnoses:  None    15:56 Discussed pt with Dr. Oletta Lamas, [facility], who recommends trying enema and probable admission to medicine service for failure of outpt treatment of constipation.  They will consult on pt.  16:03 patient has had some stool with the soapsuds enema. She does have evidence of an ileus on x-ray. I will consult  hospitalist for admission.    Malvin Johns, MD 10/29/14 2255

## 2014-10-29 NOTE — ED Notes (Signed)
Pt c/o constipation x 18 days, nausea x 1 day, and fever starting this morning.  Pain score 7/10.  Pt was seen x 2 days ago at [facility] for same.  Pt reports trying to drink Golytely, but it made her nauseous.  Hx of MS and paraplegia.  Pt reports that temperature was 98.0 and sts "my doctor told me that is a fever for me."

## 2014-10-30 ENCOUNTER — Inpatient Hospital Stay (HOSPITAL_COMMUNITY): Payer: Medicaid Other

## 2014-10-30 DIAGNOSIS — K5900 Constipation, unspecified: Secondary | ICD-10-CM

## 2014-10-30 DIAGNOSIS — G35 Multiple sclerosis: Secondary | ICD-10-CM

## 2014-10-30 LAB — COMPREHENSIVE METABOLIC PANEL
ALBUMIN: 4.1 g/dL (ref 3.5–5.0)
ALT: 15 U/L (ref 14–54)
AST: 17 U/L (ref 15–41)
Alkaline Phosphatase: 90 U/L (ref 38–126)
Anion gap: 11 (ref 5–15)
BUN: 9 mg/dL (ref 6–20)
CO2: 25 mmol/L (ref 22–32)
Calcium: 9 mg/dL (ref 8.9–10.3)
Chloride: 102 mmol/L (ref 101–111)
Creatinine, Ser: 0.42 mg/dL — ABNORMAL LOW (ref 0.44–1.00)
GFR calc Af Amer: >60 mL/min (ref >60)
GFR calc non Af Amer: >60 mL/min (ref >60)
GLUCOSE: 78 mg/dL (ref 65–99)
POTASSIUM: 3.9 mmol/L (ref 3.5–5.1)
Sodium: 138 mmol/L (ref 135–145)
Total Bilirubin: 0.9 mg/dL (ref 0.3–1.2)
Total Protein: 6.8 g/dL (ref 6.5–8.1)

## 2014-10-30 LAB — CBC
HEMATOCRIT: 39.3 % (ref 36.0–46.0)
Hemoglobin: 12.5 g/dL (ref 12.0–15.0)
MCH: 26.9 pg (ref 26.0–34.0)
MCHC: 31.8 g/dL (ref 30.0–36.0)
MCV: 84.5 fL (ref 78.0–100.0)
Platelets: 204 10*3/uL (ref 150–400)
RBC: 4.65 MIL/uL (ref 3.87–5.11)
RDW: 14.8 % (ref 11.5–15.5)
WBC: 5.2 10*3/uL (ref 4.0–10.5)

## 2014-10-30 LAB — MAGNESIUM: MAGNESIUM: 2.1 mg/dL (ref 1.7–2.4)

## 2014-10-30 LAB — TSH: TSH: 3.323 u[IU]/mL (ref 0.350–4.500)

## 2014-10-30 LAB — PHOSPHORUS: Phosphorus: 2.7 mg/dL (ref 2.5–4.6)

## 2014-10-30 MED ORDER — BISACODYL 10 MG RE SUPP
10.0000 mg | Freq: Once | RECTAL | Status: AC
  Administered 2014-10-30: 10 mg via RECTAL
  Filled 2014-10-30: qty 1

## 2014-10-30 MED ORDER — SORBITOL 70 % SOLN
30.0000 mL | Freq: Four times a day (QID) | Status: DC
  Administered 2014-10-30 (×2): 30 mL via ORAL
  Filled 2014-10-30 (×3): qty 30

## 2014-10-30 NOTE — Consult Note (Signed)
[facility] Consult Note  Referring Provider: No ref. provider found Primary Care Physician:  Simona Huh, MD Primary Gastroenterologist:  Dr.  Laurel Dimmer Complaint: Constipation HPI: [name] is an 34 y.o. white female  with transverse myelitis multiple sclerosis who has had chronic problems with constipation as well as urinary retention status post urostomy. She has been having elderly with constipation for the last 2 weeks and outpatient measures such as GoLYTELY have not worked. She came to the hospital yesterday and was admitted. She was given soapsuds enemas which give partial results and there were some improvement in her KUB from yesterday to today we'll terms of stool burden. There is no obstruction or definite impaction. The patient has been on amitizawas recently increased to 24 g twice a day but has not really helped. She thinks she is intolerant of Mira lax and gets a rash from it. She has had results with magnesium citrate but a 10 ounce bottle cause her to stool for days and cause her to feel dehydrated and weak whereas 5 ounces did not help her. She fills partially relieved but does not feel well enough to go home today. She has primarily been maintained on fiber supplements and low-dose,amitiza area she has never been on oral or suppository stimulant laxity of's and has never taken lactulose or sorbitol.  Past Medical History  Diagnosis Date  . Multiple sclerosis   . Depression   . PTSD (post-traumatic stress disorder)   . Paraplegia   . Transverse myelitis   . Bladder stones     Hx of  . GERD (gastroesophageal reflux disease)     Past Surgical History  Procedure Laterality Date  . Tonsillectomy    . Cholecystectomy    . Cystectomy w/ ureteroileal conduit    . Wisdom tooth extraction      Medications Prior to Admission  Medication Sig Dispense Refill  . baclofen (LIORESAL) 20 MG tablet Take 20 mg by mouth 3 (three) times daily.    . diazepam  (VALIUM) 5 MG tablet Take 1-2 tablets (5-10 mg total) by mouth every 8 (eight) hours. Take 1 tablet in AM, 1 tablet in PM, and 2 tablets at bedtime 30 tablet 0  . fluticasone (FLONASE) 50 MCG/ACT nasal spray Place 2 sprays into the nose daily as needed for allergies.     Marland Kitchen gabapentin (NEURONTIN) 400 MG capsule Take 800 mg by mouth 3 (three) times daily.  1  . ibuprofen (ADVIL,MOTRIN) 200 MG tablet Take 400 mg by mouth every 6 (six) hours as needed for headache, mild pain or moderate pain.     Marland Kitchen lamoTRIgine (LAMICTAL) 25 MG tablet Take 125 mg by mouth daily.    Marland Kitchen loratadine (CLARITIN) 10 MG tablet Take 10 mg by mouth daily.    Marland Kitchen lubiprostone (AMITIZA) 24 MCG capsule Take 24 mcg by mouth 2 (two) times daily with a meal.    . magnesium citrate SOLN Take 148 mLs (0.5 Bottles total) by mouth as needed for severe constipation. (Patient taking differently: Take 1 Bottle by mouth as needed for severe constipation. ) 195 mL   . omeprazole (PRILOSEC) 40 MG capsule Take 40 mg by mouth daily.    . ondansetron (ZOFRAN) 8 MG tablet Take 8 mg by mouth every 8 (eight) hours as needed for nausea or vomiting.     . polyethylene glycol (GOLYTELY) 236 G solution Take 4,000 mLs by mouth once. Drink the 1 gallon of golytely over 24 hours. 4000 mL 0  .  rizatriptan (MAXALT-MLT) 10 MG disintegrating tablet Take 10 mg by mouth as needed. For migraines; may repeat in 1 hour if needed.    . sertraline (ZOLOFT) 100 MG tablet Take 100 mg by mouth daily.    . Sod Citrate-Citric Acid (CYTRA-2 PO) Take 30 mLs by mouth at bedtime.    . Wheat Dextrin (BENEFIBER DRINK MIX PO) Take 2 scoop by mouth daily.    Marland Kitchen HYDROcodone-acetaminophen (NORCO/VICODIN) 5-325 MG per tablet Take 1-2 tablets by mouth every 6 (six) hours as needed. (Patient not taking: Reported on 10/27/2014) 6 tablet 0    Allergies:  Allergies  Allergen Reactions  . Other Anaphylaxis    ALL -MYCINS  . Aspirin Other (See Comments)    Reaction unknown  . Iodine Other  (See Comments)    Reaction unknown  . Latex Other (See Comments)    Reaction unknown  . Shellfish-Derived Products Other (See Comments)    Reaction unknown  . Strawberry Hives  . Vancomycin Other (See Comments)    Reaction unknown    Family History  Problem Relation Age of Onset  . Diabetes type II Father   . CAD Father   . Dementia Father   . Diabetes type II Mother   . Hypertension Mother   . Breast cancer Other   . Diabetes type II Other     Social History:  reports that she has never smoked. She has never used smokeless tobacco. She reports that she does not drink alcohol or use illicit drugs.  Review of Systems: negative except as above   Blood pressure 118/68, pulse 80, temperature 97.7 F (36.5 C), temperature source Oral, resp. rate 18, height 5' 4" (1.626 m), weight 65.772 kg (145 lb), last menstrual period 09/17/2014, SpO2 100 %. Head: Normocephalic, without obvious abnormality, atraumatic Neck: no adenopathy, no carotid bruit, no JVD, supple, symmetrical, trachea midline and thyroid not enlarged, symmetric, no tenderness/mass/nodules Resp: clear to auscultation bilaterally Cardio: regular rate and rhythm, S1, S2 normal, no murmur, click, rub or gallop GI: Abdomen soft slightly distended with normoactive bowel sounds. No visceromegaly mass or guarding. Extremities: extremities normal, atraumatic, no cyanosis or edema  Results for orders placed or performed during the hospital encounter of 10/29/14 (from the past 48 hour(s))  Urinalysis, Routine w reflex microscopic (not at [facility])     Status: Abnormal   Collection Time: 10/29/14  4:27 PM  Result Value Ref Range   Color, Urine YELLOW YELLOW   APPearance CLOUDY (A) CLEAR   Specific Gravity, Urine 1.016 1.005 - 1.030   pH 8.5 (H) 5.0 - 8.0   Glucose, UA NEGATIVE NEGATIVE mg/dL   Hgb urine dipstick MODERATE (A) NEGATIVE   Bilirubin Urine NEGATIVE NEGATIVE   Ketones, ur NEGATIVE NEGATIVE mg/dL   Protein, ur 30 (A)  NEGATIVE mg/dL   Urobilinogen, UA 2.0 (H) 0.0 - 1.0 mg/dL   Nitrite NEGATIVE NEGATIVE   Leukocytes, UA TRACE (A) NEGATIVE  Urine microscopic-add on     Status: Abnormal   Collection Time: 10/29/14  4:27 PM  Result Value Ref Range   Squamous Epithelial / LPF FEW (A) RARE   WBC, UA 11-20 <3 WBC/hpf   RBC / HPF 3-6 <3 RBC/hpf   Bacteria, UA MANY (A) RARE  CBC with Differential     Status: None   Collection Time: 10/29/14  5:25 PM  Result Value Ref Range   WBC 6.3 4.0 - 10.5 K/uL   RBC 5.07 3.87 - 5.11 MIL/uL   Hemoglobin  13.6 12.0 - 15.0 g/dL   HCT 42.2 36.0 - 46.0 %   MCV 83.2 78.0 - 100.0 fL   MCH 26.8 26.0 - 34.0 pg   MCHC 32.2 30.0 - 36.0 g/dL   RDW 14.6 11.5 - 15.5 %   Platelets 219 150 - 400 K/uL   Neutrophils Relative % 77 43 - 77 %   Neutro Abs 4.8 1.7 - 7.7 K/uL   Lymphocytes Relative 20 12 - 46 %   Lymphs Abs 1.3 0.7 - 4.0 K/uL   Monocytes Relative 3 3 - 12 %   Monocytes Absolute 0.2 0.1 - 1.0 K/uL   Eosinophils Relative 0 0 - 5 %   Eosinophils Absolute 0.0 0.0 - 0.7 K/uL   Basophils Relative 0 0 - 1 %   Basophils Absolute 0.0 0.0 - 0.1 K/uL  I-stat Chem 8, ED     Status: Abnormal   Collection Time: 10/29/14  5:35 PM  Result Value Ref Range   Sodium 138 135 - 145 mmol/L   Potassium 3.9 3.5 - 5.1 mmol/L   Chloride 101 101 - 111 mmol/L   BUN 8 6 - 20 mg/dL   Creatinine, Ser 0.50 0.44 - 1.00 mg/dL   Glucose, Bld 85 65 - 99 mg/dL   Calcium, Ion 1.12 1.12 - 1.23 mmol/L   TCO2 22 0 - 100 mmol/L   Hemoglobin 15.3 (H) 12.0 - 15.0 g/dL   HCT 45.0 36.0 - 46.0 %  Magnesium     Status: None   Collection Time: 10/30/14  5:30 AM  Result Value Ref Range   Magnesium 2.1 1.7 - 2.4 mg/dL  Phosphorus     Status: None   Collection Time: 10/30/14  5:30 AM  Result Value Ref Range   Phosphorus 2.7 2.5 - 4.6 mg/dL  TSH     Status: None   Collection Time: 10/30/14  5:30 AM  Result Value Ref Range   TSH 3.323 0.350 - 4.500 uIU/mL  Comprehensive metabolic panel     Status:  Abnormal   Collection Time: 10/30/14  5:30 AM  Result Value Ref Range   Sodium 138 135 - 145 mmol/L   Potassium 3.9 3.5 - 5.1 mmol/L   Chloride 102 101 - 111 mmol/L   CO2 25 22 - 32 mmol/L   Glucose, Bld 78 65 - 99 mg/dL   BUN 9 6 - 20 mg/dL   Creatinine, Ser 0.42 (L) 0.44 - 1.00 mg/dL   Calcium 9.0 8.9 - 10.3 mg/dL   Total Protein 6.8 6.5 - 8.1 g/dL   Albumin 4.1 3.5 - 5.0 g/dL   AST 17 15 - 41 U/L   ALT 15 14 - 54 U/L   Alkaline Phosphatase 90 38 - 126 U/L   Total Bilirubin 0.9 0.3 - 1.2 mg/dL   GFR calc non Af Amer >60 >60 mL/min   GFR calc Af Amer >60 >60 mL/min    Comment: (NOTE) The eGFR has been calculated using the CKD EPI equation. This calculation has not been validated in all clinical situations. eGFR's persistently <60 mL/min signify possible Chronic Kidney Disease.    Anion gap 11 5 - 15  CBC     Status: None   Collection Time: 10/30/14  5:30 AM  Result Value Ref Range   WBC 5.2 4.0 - 10.5 K/uL   RBC 4.65 3.87 - 5.11 MIL/uL   Hemoglobin 12.5 12.0 - 15.0 g/dL   HCT 39.3 36.0 - 46.0 %   MCV 84.5  78.0 - 100.0 fL   MCH 26.9 26.0 - 34.0 pg   MCHC 31.8 30.0 - 36.0 g/dL   RDW 14.8 11.5 - 15.5 %   Platelets 204 150 - 400 K/uL   Dg Abd 1 View  10/30/2014   CLINICAL DATA:  Abdominal pain, nausea/ constipation, follow-up ileus  EXAM: ABDOMEN - 1 VIEW  COMPARISON:  10/29/2014 and 10/27/2014  FINDINGS: Nonobstructive bowel gas pattern.  Mild colonic stool burden, improved.  Visualized osseous structures are within normal limits.  IMPRESSION: No evidence of bowel obstruction.  Mild colonic stool burden, improved.   Electronically Signed   By: Julian Hy M.D.   On: 10/30/2014 08:55   Dg Abd Acute W/chest  10/29/2014   CLINICAL DATA:  Constipation for 17 days. Nausea for 1 day. Fever for 1 day  EXAM: DG ABDOMEN ACUTE W/ 1V CHEST  COMPARISON:  Abdomen series September 27, 2014; CT abdomen and pelvis September 27, 2014  FINDINGS: PA chest: Lungs are clear. Heart size and pulmonary  vascularity are normal. No adenopathy.  Supine and left lateral decubitus abdomen: There is diffuse stool throughout the colon. There is no appreciable bowel dilatation. There are scattered air-fluid levels on the decubitus image. No free air. No abnormal calcifications.  IMPRESSION: Diffuse stool throughout colon. Scattered air-fluid levels without bowel dilatation. Question early ileus or enteritis. Obstruction not felt to be likely. Lungs clear.   Electronically Signed   By: Lowella Grip III M.D.   On: 10/29/2014 17:04    Assessment: Constipation related to MS and possibly medications Plan:  Entire history of her constipation and methods to control it reviewed and will try a Dulcolax suppository and start sorbitol 30 mL 4 times a day as a trial and will follow up tomorrow. We'll also continue her amitiza that she does not want to continue Mira lax or try any magnesium citrate.  , C 10/30/2014, 9:54 AM  Pager [phone] If no answer or after 5 PM call [phone]

## 2014-10-30 NOTE — Progress Notes (Signed)
[facility] PROGRESS NOTE  [name] XIP:[id number] DOB: Sep 03, 1980 DOA: 10/29/2014 PCP: Simona Huh, MD 34 year old female with history of multiple sclerosis , paraplegia with transverse myelitis,, depression, chronic constipation who follows with Dr. Alferd Apa GI presented to the ED for able to have significant bowel movement for the past 16 days. She presented to the ED on 7/27 with the same symptoms and [facility] was consulted by ED physician who recommended GoLYTELY. She reports trying GoLYTELY, mag citrate and saline antibiotic home without much relief. Patient is on Benefiber and admits home which hasn't helped her symptoms. She has not been able to tolerate MiraLAX passing blood with stool in the past. She was given soapsuds enema in the ED with some improvement in symptoms. Hospitalist admission requested for further management.   Assessment/Plan: Chronic severe constipation  Possibly neurological related to her underlying MS. following with Dr. Oletta Lamas as outpatient and has an appointment to see him on August 5. Will try soapsuds enemas needed since it has shown some improvement. Patient and her mother requesting if she needs to be referred to an [place] specialist locally who has expertise on severe constipation symptoms like this. [facility] has been consulted and will follow up with recommendations. Continue Reglan and IV fluids.  Multiple sclerosis/paraplegia Patient bedbound. Has a foley  and colostomy. Continue Neurontin,  Diazepam, baclofen and Lamictal..sees Dr Leta Baptist as outpt.  Depression Continue Zoloft  DVT prophylaxis: Subcutaneous Lovenox Diet: Clear liquid  Code Status: Full code Family Communication: Mother bedside Disposition Plan: Home once improved possibly in the next 24 hours   Consultants:  [facility] (Dr. Amedeo Plenty )  Procedures:  None  Antibiotics:  None  HPI/Subjective: Patient seen and examined. Denies any nausea or abdominal pain.  Abdominal fullness is mildly improved after small bowel movement following soapsuds. Patient frustrated regarding her condition.  Objective: Filed Vitals:   10/30/14 0541  BP: 118/68  Pulse: 80  Temp: 97.7 F (36.5 C)  Resp: 18    Intake/Output Summary (Last 24 hours) at 10/30/14 1114 Last data filed at 10/30/14 0836  Gross per 24 hour  Intake 627.25 ml  Output    350 ml  Net 277.25 ml   Filed Weights   10/29/14 2029  Weight: 65.772 kg (145 lb)    Exam:   General:  Young female in no acute distress  HEENT: No pallor, moist mucosa  Cardiovascular: Normal S1 and S2, no murmurs  Respiratory: Clear bilaterally  Abdomen: Soft, mild distention, nontender, small liquid stool in  colostomy, bowel sounds present, foley catheter++  Musculoskeletal: Warm, no edema  CNS: Paraplegic  Data Reviewed: Basic Metabolic Panel:  Recent Labs Lab 10/27/14 1920 10/29/14 1735 10/30/14 0530  NA 134* 138 138  K 4.3 3.9 3.9  CL 99* 101 102  CO2 27  --  25  GLUCOSE 82 85 78  BUN 11 8 9   CREATININE 0.38* 0.50 0.42*  CALCIUM 8.9  --  9.0  MG  --   --  2.1  PHOS  --   --  2.7   Liver Function Tests:  Recent Labs Lab 10/27/14 1920 10/30/14 0530  AST 19 17  ALT 15 15  ALKPHOS 100 90  BILITOT 0.9 0.9  PROT 7.0 6.8  ALBUMIN 4.1 4.1   No results for input(s): LIPASE, AMYLASE in the last 168 hours. No results for input(s): AMMONIA in the last 168 hours. CBC:  Recent Labs Lab 10/27/14 1920 10/29/14 1725 10/29/14 1735 10/30/14 0530  WBC 9.1 6.3  --  5.2  NEUTROABS 7.1 4.8  --   --   HGB 12.9 13.6 15.3* 12.5  HCT 40.0 42.2 45.0 39.3  MCV 82.5 83.2  --  84.5  PLT 197 219  --  204   Cardiac Enzymes: No results for input(s): CKTOTAL, CKMB, CKMBINDEX, TROPONINI in the last 168 hours. BNP (last 3 results) No results for input(s): BNP in the last 8760 hours.  ProBNP (last 3 results) No results for input(s): PROBNP in the last 8760 hours.  CBG: No results for  input(s): GLUCAP in the last 168 hours.  No results found for this or any previous visit (from the past 240 hour(s)).   Studies: Dg Abd 1 View  10/30/2014   CLINICAL DATA:  Abdominal pain, nausea/ constipation, follow-up ileus  EXAM: ABDOMEN - 1 VIEW  COMPARISON:  10/29/2014 and 10/27/2014  FINDINGS: Nonobstructive bowel gas pattern.  Mild colonic stool burden, improved.  Visualized osseous structures are within normal limits.  IMPRESSION: No evidence of bowel obstruction.  Mild colonic stool burden, improved.   Electronically Signed   By: Julian Hy M.D.   On: 10/30/2014 08:55   Dg Abd Acute W/chest  10/29/2014   CLINICAL DATA:  Constipation for 17 days. Nausea for 1 day. Fever for 1 day  EXAM: DG ABDOMEN ACUTE W/ 1V CHEST  COMPARISON:  Abdomen series September 27, 2014; CT abdomen and pelvis September 27, 2014  FINDINGS: PA chest: Lungs are clear. Heart size and pulmonary vascularity are normal. No adenopathy.  Supine and left lateral decubitus abdomen: There is diffuse stool throughout the colon. There is no appreciable bowel dilatation. There are scattered air-fluid levels on the decubitus image. No free air. No abnormal calcifications.  IMPRESSION: Diffuse stool throughout colon. Scattered air-fluid levels without bowel dilatation. Question early ileus or enteritis. Obstruction not felt to be likely. Lungs clear.   Electronically Signed   By: Lowella Grip III M.D.   On: 10/29/2014 17:04    Scheduled Meds: . baclofen  20 mg Oral TID  . diazepam  10 mg Oral QHS  . diazepam  5 mg Oral BID  . docusate sodium  100 mg Oral BID  . enoxaparin (LOVENOX) injection  40 mg Subcutaneous Q24H  . fluticasone  2 spray Each Nare Daily  . gabapentin  800 mg Oral TID  . lamoTRIgine  125 mg Oral Daily  . loratadine  10 mg Oral Daily  . lubiprostone  24 mcg Oral BID WC  . metoCLOPramide (REGLAN) injection  5 mg Intravenous 4 times per day  . pantoprazole  40 mg Oral Daily  . polyethylene glycol  17 g Oral  BID  . sertraline  100 mg Oral Daily   Continuous Infusions:      Time spent: 25 minutes    Daryn Pisani, [place]  [facility] Pager [phone]. If 7PM-7AM, please contact night-coverage at [email or web address], password [facility] 10/30/2014, 11:14 AM  LOS: 1 day

## 2014-10-31 DIAGNOSIS — G8220 Paraplegia, unspecified: Secondary | ICD-10-CM

## 2014-10-31 LAB — URINE CULTURE: Culture: >=100000

## 2014-10-31 MED ORDER — SENNOSIDES-DOCUSATE SODIUM 8.6-50 MG PO TABS: 1.0000 | ORAL_TABLET | Freq: Two times a day (BID) | ORAL | Status: DC

## 2014-10-31 MED ORDER — BISACODYL 10 MG RE SUPP: 10.0000 mg | Freq: Every day | RECTAL | Status: DC | PRN

## 2014-10-31 NOTE — Discharge Summary (Signed)
Physician Discharge Summary  [name]:[id number] DOB: [birth year] DOA: 10/29/2014  PCP: Simona Huh, MD  Admit date: 10/29/2014 Discharge date: 10/31/2014  Time spent: 30 minutes  Recommendations for Outpatient Follow-up:  Discharge home with outpatient follow-up with Dr. Oletta Lamas (has appointment on 11/05/2014)  Discharge Diagnoses:  Principal Problem:   Obstipation   Active Problems:   Paraplegia   Constipation   Multiple sclerosis   Ileus   Discharge Condition: fair  Diet recommendation: regular  Filed Weights   10/29/14 2029  Weight: 65.772 kg (145 lb)    History of present illness:  34 year old female with history of multiple sclerosis , paraplegia with transverse myelitis,, depression, chronic constipation who follows with Dr. Alferd Apa GI presented to the ED for able to have significant bowel movement for the past 16 days. She presented to the ED on 7/27 with the same symptoms and [facility] was consulted by ED physician who recommended GoLYTELY. She reports trying GoLYTELY, mag citrate and saline antibiotic home without much relief. Patient is on Benefiber and admits home which hasn't helped her symptoms. She has not been able to tolerate MiraLAX passing blood with stool in the past. She was given soapsuds enema in the ED with some improvement in symptoms. Hospitalist admission requested for further management.  Hospital Course:  Chronic severe constipation  Possibly neurological related to her underlying MS. following with Dr. Oletta Lamas as outpatient and has an appointment to see him on August 5. Soapsuds enema given on admission without much relief. Seen by [facility] and recommended Dulcolax suppository and resuming Amitza. Also order for sorbitol which after she received about 2 doses patient had explosive bowel movement that soiled the entire bed . Patient feels much better this morning. Diet advanced to regular. Discussed with Dr. Amedeo Plenty and since  sorbitol was too aggressive for her , he recommends to continue her home regimen (Benefiber, Amitza ) along with Dulcolax suppository as needed and senna-Colace. Patient instructed to follow-up with Dr. Oletta Lamas as scheduled.  Multiple sclerosis/paraplegia Patient bedbound. Has a foley and colostomy. Continue Neurontin, Diazepam, baclofen and Lamictal..sees Dr Leta Baptist as outpt.  Depression Continue Zoloft    Code Status: Full code Family Communication: Mother bedside Disposition Plan: Home    Consultants:  [facility] (Dr. Amedeo Plenty )  Procedures:  None  Antibiotics:  None  Discharge Exam: Filed Vitals:   10/31/14 0634  BP: 102/64  Pulse: 71  Temp: 97.4 F (36.3 C)  Resp: 18    General: Young female in no acute distress  HEENT:  moist mucosa  Cardiovascular: Normal S1 and S2, no murmurs  Respiratory: Clear bilaterally  Abdomen: Soft, mild distention, nontender,  liquid stool in colostomy, bowel sounds present, foley catheter+  Musculoskeletal: Warm, no edema  CNS: Paraplegic   Discharge Instructions    Current Discharge Medication List    START taking these medications   Details  bisacodyl (DULCOLAX) 10 MG suppository Place 1 suppository (10 mg total) rectally daily as needed for moderate constipation. Qty: 12 suppository, Refills: 0    senna-docusate (SENOKOT-S) 8.6-50 MG per tablet Take 1 tablet by mouth 2 (two) times daily. Qty: 30 tablet, Refills: 0      CONTINUE these medications which have NOT CHANGED   Details  baclofen (LIORESAL) 20 MG tablet Take 20 mg by mouth 3 (three) times daily.    diazepam (VALIUM) 5 MG tablet Take 1-2 tablets (5-10 mg total) by mouth every 8 (eight) hours. Take 1 tablet in AM, 1  tablet in PM, and 2 tablets at bedtime Qty: 30 tablet, Refills: 0    fluticasone (FLONASE) 50 MCG/ACT nasal spray Place 2 sprays into the nose daily as needed for allergies.     gabapentin (NEURONTIN) 400 MG capsule Take 800 mg by  mouth 3 (three) times daily. Refills: 1    ibuprofen (ADVIL,MOTRIN) 200 MG tablet Take 400 mg by mouth every 6 (six) hours as needed for headache, mild pain or moderate pain.     lamoTRIgine (LAMICTAL) 25 MG tablet Take 125 mg by mouth daily.    loratadine (CLARITIN) 10 MG tablet Take 10 mg by mouth daily.    lubiprostone (AMITIZA) 24 MCG capsule Take 24 mcg by mouth 2 (two) times daily with a meal.    magnesium citrate SOLN Take 148 mLs (0.5 Bottles total) by mouth as needed for severe constipation. Qty: 195 mL    omeprazole (PRILOSEC) 40 MG capsule Take 40 mg by mouth daily.    ondansetron (ZOFRAN) 8 MG tablet Take 8 mg by mouth every 8 (eight) hours as needed for nausea or vomiting.     rizatriptan (MAXALT-MLT) 10 MG disintegrating tablet Take 10 mg by mouth as needed. For migraines; may repeat in 1 hour if needed.    sertraline (ZOLOFT) 100 MG tablet Take 100 mg by mouth daily.    Wheat Dextrin (BENEFIBER DRINK MIX PO) Take 2 scoop by mouth daily.      STOP taking these medications     polyethylene glycol (GOLYTELY) 236 G solution      Sod Citrate-Citric Acid (CYTRA-2 PO)      HYDROcodone-acetaminophen (NORCO/VICODIN) 5-325 MG per tablet        Allergies  Allergen Reactions  . Other Anaphylaxis    ALL -MYCINS  . Aspirin Other (See Comments)    Reaction unknown  . Iodine Other (See Comments)    Reaction unknown  . Latex Other (See Comments)    Reaction unknown  . Shellfish-Derived Products Other (See Comments)    Reaction unknown  . Strawberry Hives  . Vancomycin Other (See Comments)    Reaction unknown   Follow-up Information    Follow up with Simona Huh, MD In 2 weeks.   Specialty:  Family Medicine   Contact information:   [address] [place] [address] [phone]       Follow up with Winfield Cunas, MD On 11/05/2014.   Specialty:  Gastroenterology   Contact information:   [address] [place]  [address] [phone]        The results of significant diagnostics from this hospitalization (including imaging, microbiology, ancillary and laboratory) are listed below for reference.    Significant Diagnostic Studies: Dg Abd 1 View  10/30/2014   CLINICAL DATA:  Abdominal pain, nausea/ constipation, follow-up ileus  EXAM: ABDOMEN - 1 VIEW  COMPARISON:  10/29/2014 and 10/27/2014  FINDINGS: Nonobstructive bowel gas pattern.  Mild colonic stool burden, improved.  Visualized osseous structures are within normal limits.  IMPRESSION: No evidence of bowel obstruction.  Mild colonic stool burden, improved.   Electronically Signed   By: Julian Hy M.D.   On: 10/30/2014 08:55   Dg Abd 1 View  10/27/2014   CLINICAL DATA:  Constipation for 16 days  EXAM: ABDOMEN - 1 VIEW  COMPARISON:  CT abdomen and pelvis September 27, 2014  FINDINGS: There is diffuse stool throughout the colon and rectum. There is no bowel dilatation or air-fluid level  suggesting obstruction. No free air is seen on this supine examination. There are surgical clips the right upper quadrant. There is narrowing of each hip joint.  IMPRESSION: Diffuse stool throughout colon and rectum consistent with constipation. No obstruction or free air.   Electronically Signed   By: Lowella Grip III M.D.   On: 10/27/2014 20:15   Dg Abd Acute W/chest  10/29/2014   CLINICAL DATA:  Constipation for 17 days. Nausea for 1 day. Fever for 1 day  EXAM: DG ABDOMEN ACUTE W/ 1V CHEST  COMPARISON:  Abdomen series September 27, 2014; CT abdomen and pelvis September 27, 2014  FINDINGS: PA chest: Lungs are clear. Heart size and pulmonary vascularity are normal. No adenopathy.  Supine and left lateral decubitus abdomen: There is diffuse stool throughout the colon. There is no appreciable bowel dilatation. There are scattered air-fluid levels on the decubitus image. No free air. No abnormal calcifications.  IMPRESSION: Diffuse stool throughout colon. Scattered air-fluid levels  without bowel dilatation. Question early ileus or enteritis. Obstruction not felt to be likely. Lungs clear.   Electronically Signed   By: Lowella Grip III M.D.   On: 10/29/2014 17:04    Microbiology: Recent Results (from the past 240 hour(s))  Urine culture     Status: None   Collection Time: 10/29/14  4:27 PM  Result Value Ref Range Status   Specimen Description URINE, RANDOM  Final   Special Requests NONE  Final   Culture   Final    >=100,000 COLONIES/mL ESCHERICHIA COLI Performed at [facility]    Report Status 10/31/2014 FINAL  Final   Organism ID, Bacteria ESCHERICHIA COLI  Final      Susceptibility   Escherichia coli - MIC*    AMPICILLIN 8 SENSITIVE Sensitive     CEFAZOLIN <=4 SENSITIVE Sensitive     CEFTRIAXONE <=1 SENSITIVE Sensitive     CIPROFLOXACIN <=0.25 SENSITIVE Sensitive     GENTAMICIN <=1 SENSITIVE Sensitive     IMIPENEM <=0.25 SENSITIVE Sensitive     NITROFURANTOIN <=16 SENSITIVE Sensitive     TRIMETH/SULFA <=20 SENSITIVE Sensitive     AMPICILLIN/SULBACTAM 4 SENSITIVE Sensitive     PIP/TAZO <=4 SENSITIVE Sensitive     * >=100,000 COLONIES/mL ESCHERICHIA COLI     Labs: Basic Metabolic Panel:  Recent Labs Lab 10/27/14 1920 10/29/14 1735 10/30/14 0530  NA 134* 138 138  K 4.3 3.9 3.9  CL 99* 101 102  CO2 27  --  25  GLUCOSE 82 85 78  BUN 11 8 9   CREATININE 0.38* 0.50 0.42*  CALCIUM 8.9  --  9.0  MG  --   --  2.1  PHOS  --   --  2.7   Liver Function Tests:  Recent Labs Lab 10/27/14 1920 10/30/14 0530  AST 19 17  ALT 15 15  ALKPHOS 100 90  BILITOT 0.9 0.9  PROT 7.0 6.8  ALBUMIN 4.1 4.1   No results for input(s): LIPASE, AMYLASE in the last 168 hours. No results for input(s): AMMONIA in the last 168 hours. CBC:  Recent Labs Lab 10/27/14 1920 10/29/14 1725 10/29/14 1735 10/30/14 0530  WBC 9.1 6.3  --  5.2  NEUTROABS 7.1 4.8  --   --   HGB 12.9 13.6 15.3* 12.5  HCT 40.0 42.2 45.0 39.3  MCV 82.5 83.2  --  84.5  PLT 197  219  --  204   Cardiac Enzymes: No results for input(s): CKTOTAL, CKMB, CKMBINDEX, TROPONINI in the  last 168 hours. BNP: BNP (last 3 results) No results for input(s): BNP in the last 8760 hours.  ProBNP (last 3 results) No results for input(s): PROBNP in the last 8760 hours.  CBG: No results for input(s): GLUCAP in the last 168 hours.     SignedLouellen Molder  [facility] 10/31/2014, 10:29 AM

## 2014-10-31 NOTE — Progress Notes (Signed)
Pt discharged.  Leaving with two prescriptions to pick up at pharmacy.  No complaints.  Room air.  Respirations even and unlabored.  Denies pain.  Mother at side.

## 2015-01-24 ENCOUNTER — Telehealth: Payer: Self-pay | Admitting: Diagnostic Neuroimaging

## 2015-01-24 NOTE — Telephone Encounter (Signed)
Patient called regarding a mass on her right foot x a week or so, swelling and foot turns to left. Elevating and putting ice on it doesn't reduce the swelling. Patient feels this is related to her transverse myelitis.

## 2015-01-24 NOTE — Telephone Encounter (Signed)
Spoke with patient and advised per Dr Leta Baptist, she see PCP or podiatrist for her current issue. She stated she "was going to do that but wanted Dr Gladstone Lighter advice first". Advised she call back as needed after seeing another provider. She verbalized understanding, aprpreciation for call.

## 2015-01-24 NOTE — Telephone Encounter (Signed)
Follow up with PCP or podiatry first. -VRP

## 2015-03-10 ENCOUNTER — Other Ambulatory Visit: Payer: Self-pay | Admitting: Internal Medicine

## 2015-03-10 DIAGNOSIS — G373 Acute transverse myelitis in demyelinating disease of central nervous system: Secondary | ICD-10-CM

## 2015-03-21 ENCOUNTER — Other Ambulatory Visit: Payer: Self-pay | Admitting: Physician Assistant

## 2015-03-24 ENCOUNTER — Ambulatory Visit
Admission: RE | Admit: 2015-03-24 | Discharge: 2015-03-24 | Disposition: A | Discharge status: Home / Self Care | Payer: Medicaid Other | Source: Ambulatory Visit | Attending: Internal Medicine | Admitting: Internal Medicine

## 2015-03-24 DIAGNOSIS — G373 Acute transverse myelitis in demyelinating disease of central nervous system: Secondary | ICD-10-CM

## 2015-03-24 LAB — CYTOLOGY - PAP

## 2015-03-24 MED ORDER — GADOBENATE DIMEGLUMINE 529 MG/ML IV SOLN
13.0000 mL | Freq: Once | INTRAVENOUS | Status: AC | PRN
  Administered 2015-03-24: 13 mL via INTRAVENOUS

## 2015-06-02 ENCOUNTER — Other Ambulatory Visit (HOSPITAL_COMMUNITY): Payer: Self-pay | Admitting: Urology

## 2015-06-02 DIAGNOSIS — N2889 Other specified disorders of kidney and ureter: Secondary | ICD-10-CM

## 2015-06-13 ENCOUNTER — Ambulatory Visit (HOSPITAL_COMMUNITY)
Admission: RE | Admit: 2015-06-13 | Discharge: 2015-06-13 | Disposition: A | Discharge status: Home / Self Care | Payer: Medicaid Other | Source: Ambulatory Visit | Attending: Urology | Admitting: Urology

## 2015-06-13 DIAGNOSIS — D4100 Neoplasm of uncertain behavior of unspecified kidney: Secondary | ICD-10-CM | POA: Diagnosis present

## 2015-06-13 DIAGNOSIS — N2889 Other specified disorders of kidney and ureter: Secondary | ICD-10-CM

## 2015-06-13 MED ORDER — GADOBENATE DIMEGLUMINE 529 MG/ML IV SOLN
15.0000 mL | Freq: Once | INTRAVENOUS | Status: AC | PRN
  Administered 2015-06-13: 13 mL via INTRAVENOUS

## 2015-07-31 ENCOUNTER — Encounter (HOSPITAL_COMMUNITY): Payer: Self-pay | Admitting: *Deleted

## 2015-07-31 ENCOUNTER — Emergency Department (HOSPITAL_COMMUNITY)
Admission: EM | Admit: 2015-07-31 | Discharge: 2015-07-31 | Disposition: A | Discharge status: Home / Self Care | Payer: Medicaid Other | Attending: Emergency Medicine | Admitting: Emergency Medicine

## 2015-07-31 ENCOUNTER — Emergency Department (HOSPITAL_COMMUNITY): Payer: Medicaid Other

## 2015-07-31 DIAGNOSIS — F4310 Post-traumatic stress disorder, unspecified: Secondary | ICD-10-CM | POA: Diagnosis not present

## 2015-07-31 DIAGNOSIS — Z79899 Other long term (current) drug therapy: Secondary | ICD-10-CM | POA: Diagnosis not present

## 2015-07-31 DIAGNOSIS — K219 Gastro-esophageal reflux disease without esophagitis: Secondary | ICD-10-CM | POA: Insufficient documentation

## 2015-07-31 DIAGNOSIS — R197 Diarrhea, unspecified: Secondary | ICD-10-CM

## 2015-07-31 DIAGNOSIS — R112 Nausea with vomiting, unspecified: Secondary | ICD-10-CM

## 2015-07-31 DIAGNOSIS — R1084 Generalized abdominal pain: Secondary | ICD-10-CM

## 2015-07-31 DIAGNOSIS — Z91040 Latex allergy status: Secondary | ICD-10-CM | POA: Diagnosis not present

## 2015-07-31 DIAGNOSIS — L98419 Non-pressure chronic ulcer of buttock with unspecified severity: Secondary | ICD-10-CM | POA: Insufficient documentation

## 2015-07-31 DIAGNOSIS — K529 Noninfective gastroenteritis and colitis, unspecified: Secondary | ICD-10-CM | POA: Insufficient documentation

## 2015-07-31 DIAGNOSIS — N390 Urinary tract infection, site not specified: Secondary | ICD-10-CM | POA: Insufficient documentation

## 2015-07-31 DIAGNOSIS — Z3202 Encounter for pregnancy test, result negative: Secondary | ICD-10-CM | POA: Diagnosis not present

## 2015-07-31 DIAGNOSIS — F329 Major depressive disorder, single episode, unspecified: Secondary | ICD-10-CM | POA: Insufficient documentation

## 2015-07-31 DIAGNOSIS — Z87442 Personal history of urinary calculi: Secondary | ICD-10-CM | POA: Diagnosis not present

## 2015-07-31 DIAGNOSIS — G8220 Paraplegia, unspecified: Secondary | ICD-10-CM | POA: Insufficient documentation

## 2015-07-31 DIAGNOSIS — Z9049 Acquired absence of other specified parts of digestive tract: Secondary | ICD-10-CM | POA: Insufficient documentation

## 2015-07-31 DIAGNOSIS — D849 Immunodeficiency, unspecified: Secondary | ICD-10-CM | POA: Diagnosis not present

## 2015-07-31 LAB — COMPREHENSIVE METABOLIC PANEL
ALBUMIN: 4.4 g/dL (ref 3.5–5.0)
ALT: 16 U/L (ref 14–54)
ANION GAP: 12 (ref 5–15)
AST: 16 U/L (ref 15–41)
Alkaline Phosphatase: 90 U/L (ref 38–126)
BILIRUBIN TOTAL: 0.4 mg/dL (ref 0.3–1.2)
BUN: 16 mg/dL (ref 6–20)
CALCIUM: 9.2 mg/dL (ref 8.9–10.3)
CO2: 22 mmol/L (ref 22–32)
Chloride: 106 mmol/L (ref 101–111)
Creatinine, Ser: 0.4 mg/dL — ABNORMAL LOW (ref 0.44–1.00)
GFR calc non Af Amer: >60 mL/min (ref >60)
GLUCOSE: 90 mg/dL (ref 65–99)
POTASSIUM: 3.5 mmol/L (ref 3.5–5.1)
SODIUM: 140 mmol/L (ref 135–145)
TOTAL PROTEIN: 7.3 g/dL (ref 6.5–8.1)

## 2015-07-31 LAB — URINALYSIS, ROUTINE W REFLEX MICROSCOPIC
Glucose, UA: NEGATIVE mg/dL
HGB URINE DIPSTICK: NEGATIVE
KETONES UR: NEGATIVE mg/dL
Nitrite: POSITIVE — AB
PROTEIN: 30 mg/dL — AB
Specific Gravity, Urine: 1.019 (ref 1.005–1.030)
pH: 8.5 — ABNORMAL HIGH (ref 5.0–8.0)

## 2015-07-31 LAB — CBC
HEMATOCRIT: 43 % (ref 36.0–46.0)
HEMOGLOBIN: 13.9 g/dL (ref 12.0–15.0)
MCH: 27.6 pg (ref 26.0–34.0)
MCHC: 32.3 g/dL (ref 30.0–36.0)
MCV: 85.5 fL (ref 78.0–100.0)
PLATELETS: 245 10*3/uL (ref 150–400)
RBC: 5.03 MIL/uL (ref 3.87–5.11)
RDW: 15.5 % (ref 11.5–15.5)
WBC: 8.2 10*3/uL (ref 4.0–10.5)

## 2015-07-31 LAB — URINE MICROSCOPIC-ADD ON

## 2015-07-31 LAB — LIPASE, BLOOD: Lipase: 23 U/L (ref 11–51)

## 2015-07-31 LAB — POC URINE PREG, ED: PREG TEST UR: NEGATIVE

## 2015-07-31 MED ORDER — FOSFOMYCIN TROMETHAMINE 3 G PO PACK
3.0000 g | PACK | Freq: Once | ORAL | Status: DC
Start: 2015-07-31 — End: 2015-07-31
  Filled 2015-07-31: qty 3

## 2015-07-31 MED ORDER — IBUPROFEN 200 MG PO TABS
400.0000 mg | ORAL_TABLET | Freq: Once | ORAL | Status: AC
  Administered 2015-07-31: 400 mg via ORAL
  Filled 2015-07-31: qty 2

## 2015-07-31 MED ORDER — ONDANSETRON HCL 4 MG/2ML IJ SOLN
4.0000 mg | Freq: Once | INTRAMUSCULAR | Status: AC
  Administered 2015-07-31: 4 mg via INTRAVENOUS
  Filled 2015-07-31: qty 2

## 2015-07-31 MED ORDER — CIPROFLOXACIN HCL 500 MG PO TABS: 500.0000 mg | ORAL_TABLET | Freq: Two times a day (BID) | ORAL | Status: DC

## 2015-07-31 MED ORDER — SODIUM CHLORIDE 0.9 % IV BOLUS (SEPSIS)
1000.0000 mL | Freq: Once | INTRAVENOUS | Status: AC
Start: 2015-07-31 — End: 2015-07-31
  Administered 2015-07-31: 1000 mL via INTRAVENOUS

## 2015-07-31 NOTE — ED Notes (Signed)
Pt complains of diarrhea and nausea since this morning. Pt has hx of multiple sclerosis. Pt states she cannot feel below her waist but does feel intermittent cramping in her stomach.

## 2015-07-31 NOTE — ED Notes (Signed)
Pt in radiology 

## 2015-07-31 NOTE — ED Provider Notes (Addendum)
CSN: [id number]     Arrival date & time 07/31/15  1338 History   First MD Initiated Contact with Patient 07/31/15 1551     Chief Complaint  Patient presents with  . Diarrhea  . Nausea     (Consider location/radiation/quality/duration/timing/severity/associated sxs/prior Treatment) HPI Comments: [name] is a 35 y.o. 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 Patient's mother is her caregiver as she is paraplegic and wheelchair bound/bedbound, and she provides some of the history. Patient's mother states that last night she had an episode of watery diarrhea, went to bed and this morning had another episode, took a nap and upon awakening she had had 2 more episodes of diarrhea that was watery and nonbloody, covering her from the waist down her legs and soiling her bed. She had a total of approximately 4 episodes. Associated symptoms include nausea. Patient is on Amitiza for chronic constipation, but she has never had diarrhea from taking this medication. She has not been taking any other laxatives or stool softeners recently. She denies any recent travel, sick contacts, recent antibiotic use, suspicious food intake, alcohol use, or chronic NSAID use. She is not had a menstrual cycle in 3 months, but she is not sexually active and there are no mend living in the home taking care of her.  She denies any fevers, chills, chest pain, shortness breath, vomiting, constipation, obstipation, melena, hematochezia, changes in her urine, malodorous urine, hematuria, or vaginal bleeding. Her PCP felt that she likely had a GI viral illness, but they were concerned that she may get dehydrated so they came here.   Patient is a 35 y.o. female presenting with diarrhea.  The history is provided by the patient and a parent. No language interpreter was used.  Diarrhea Quality:  Watery Severity:  Moderate Onset quality:  Sudden Number of episodes:  4 Duration:  1 day Timing:  Constant Progression:  Unchanged Relieved by:  None tried Worsened by:  Nothing tried Ineffective treatments:  None tried Associated symptoms: abdominal pain   Associated symptoms: no arthralgias, no chills, no fever, no myalgias and no vomiting   Risk factors: no recent antibiotic use, no sick contacts, no suspicious food intake and no travel to endemic areas     Past Medical History  Diagnosis Date  . Multiple sclerosis ([place])   . Depression   . PTSD (post-traumatic stress disorder)   . Paraplegia ([place])   . Transverse myelitis ([place])   . Bladder stones     Hx of  . GERD (gastroesophageal reflux disease)    Past Surgical History  Procedure Laterality Date  . Tonsillectomy    . Cholecystectomy    . Cystectomy w/ ureteroileal conduit    . Wisdom tooth extraction     Family History  Problem Relation Age of Onset  . Diabetes type II Father   . CAD Father   . Dementia Father   . Diabetes type II Mother   . Hypertension Mother   . Breast cancer Other   . Diabetes type II Other    Social History  Substance Use Topics  . Smoking status: Never Smoker   . Smokeless tobacco: Never Used  . Alcohol Use: No   OB History    No data available  Review of Systems  Constitutional: Negative for fever and chills.  Respiratory: Negative for shortness of breath.   Cardiovascular: Negative for chest pain.  Gastrointestinal: Positive for nausea, abdominal pain and diarrhea. Negative for vomiting, constipation, blood in stool and anal bleeding.  Genitourinary: Negative for hematuria and vaginal bleeding.       No changes in urine, no malodorous urine  Musculoskeletal: Negative for myalgias and arthralgias.  Skin: Negative for color change.  Allergic/Immunologic: Positive for  immunocompromised state (chronically ill).  Neurological:       Baseline numbness/tingling/weakness due to paraplegia  Psychiatric/Behavioral: Negative for confusion.   10 Systems reviewed and are negative for acute change except as noted in the HPI.    Allergies  Other; Aspirin; Iodine; Latex; Shellfish-derived products; Strawberry extract; and Vancomycin  Home Medications   Prior to Admission medications   Medication Sig Start Date End Date Taking? Authorizing Provider  baclofen (LIORESAL) 20 MG tablet Take 20 mg by mouth 3 (three) times daily.    Historical Provider, MD  bisacodyl (DULCOLAX) 10 MG suppository Place 1 suppository (10 mg total) rectally daily as needed for moderate constipation. 10/31/14   Nishant Dhungel, MD  diazepam (VALIUM) 5 MG tablet Take 1-2 tablets (5-10 mg total) by mouth every 8 (eight) hours. Take 1 tablet in AM, 1 tablet in PM, and 2 tablets at bedtime 04/02/13   Robbie Lis, MD  fluticasone [facility]) 50 MCG/ACT nasal spray Place 2 sprays into the nose daily as needed for allergies.     Historical Provider, MD  gabapentin (NEURONTIN) 400 MG capsule Take 800 mg by mouth 3 (three) times daily. 01/15/14   Historical Provider, MD  ibuprofen (ADVIL,MOTRIN) 200 MG tablet Take 400 mg by mouth every 6 (six) hours as needed for headache, mild pain or moderate pain.     Historical Provider, MD  lamoTRIgine (LAMICTAL) 25 MG tablet Take 125 mg by mouth daily.    Historical Provider, MD  loratadine (CLARITIN) 10 MG tablet Take 10 mg by mouth daily.    Historical Provider, MD  lubiprostone (AMITIZA) 24 MCG capsule Take 24 mcg by mouth 2 (two) times daily with a meal.    Historical Provider, MD  magnesium citrate SOLN Take 148 mLs (0.5 Bottles total) by mouth as needed for severe constipation. Patient taking differently: Take 1 Bottle by mouth as needed for severe constipation.  09/28/14   Silver Huguenin Elgergawy, MD  omeprazole (PRILOSEC) 40 MG capsule Take 40 mg by mouth daily.     Historical Provider, MD  ondansetron (ZOFRAN) 8 MG tablet Take 8 mg by mouth every 8 (eight) hours as needed for nausea or vomiting.     Historical Provider, MD  rizatriptan (MAXALT-MLT) 10 MG disintegrating tablet Take 10 mg by mouth as needed. For migraines; may repeat in 1 hour if needed.    Historical Provider, MD  senna-docusate (SENOKOT-S) 8.6-50 MG per tablet Take 1 tablet by mouth 2 (two) times daily. 10/31/14   Nishant Dhungel, MD  sertraline (ZOLOFT) 100 MG tablet Take 100 mg by mouth daily.    Historical Provider, MD  Wheat Dextrin (BENEFIBER DRINK MIX PO) Take 2 scoop by mouth daily.    Historical Provider, MD   BP 107/71 mmHg  Pulse 84  Temp(Src) 98 F (36.7 C) (Oral)  Resp 18  SpO2 97%  LMP 05/04/2015 Physical Exam  Constitutional: She is oriented to person, place, and time. Vital signs are normal. She appears well-developed.  Non-toxic appearance. No distress.  Afebrile, nontoxic, NAD, unkempt, thin and frail, appears older than stated age, paralyzed from the waist down with some contractures in her extremities  HENT:  Head: Normocephalic and atraumatic.  Mouth/Throat: Oropharynx is clear and moist. Mucous membranes are dry.  Dry mucous membranes  Eyes: Conjunctivae and EOM are normal. Right eye exhibits no discharge. Left eye exhibits no discharge.  Neck: Normal range of motion. Neck supple.  Cardiovascular: Normal rate, regular rhythm, normal heart sounds and intact distal pulses.  Exam reveals no gallop and no friction rub.   No murmur heard. Pulmonary/Chest: Effort normal and breath sounds normal. No respiratory distress. She has no decreased breath sounds. She has no wheezes. She has no rhonchi. She has no rales.  Abdominal: Soft. Normal appearance and bowel sounds are normal. She exhibits no distension. There is generalized tenderness. There is no rigidity, no rebound, no guarding, no CVA tenderness, no tenderness at McBurney's point and negative Murphy's sign.  Soft,  nondistended, +BS throughout, with urostomy bag in RLQ with urine in bag, with mild generalized abdominal tenderness throughout without focal areas of tenderness, no r/g/r, neg murphy's, neg mcburney's, no CVA TTP   Genitourinary:  Few small ulcerations/skin tears on her buttocks with pink wound beds, no eschar or exposed bone/muscle, no surrounding erythema/cellulitis, no drainage  Musculoskeletal:  Paralyzed from the waist down, contractures in extremities, ROM at baseline  Neurological: She is alert and oriented to person, place, and time.  Baseline sensation and strength, wheelchair bound  Skin: Skin is warm and dry. No rash noted.  Small skin tears to buttocks as noted above  Psychiatric: She has a normal mood and affect.  Nursing note and vitals reviewed.   ED Course  Procedures (including critical care time) Labs Review Labs Reviewed  COMPREHENSIVE METABOLIC PANEL - Abnormal; Notable for the following:    Creatinine, Ser 0.40 (*)    All other components within normal limits  URINALYSIS, ROUTINE W REFLEX MICROSCOPIC (NOT AT [facility]) - Abnormal; Notable for the following:    Color, Urine RED (*)    APPearance TURBID (*)    pH 8.5 (*)    Bilirubin Urine SMALL (*)    Protein, ur 30 (*)    Nitrite POSITIVE (*)    Leukocytes, UA MODERATE (*)    All other components within normal limits  URINE MICROSCOPIC-ADD ON - Abnormal; Notable for the following:    Squamous Epithelial / LPF 0-5 (*)    Bacteria, UA MANY (*)    Crystals TRIPLE PHOSPHATE CRYSTALS (*)    All other components within normal limits  URINE CULTURE  LIPASE, BLOOD  CBC  POC URINE PREG, ED    Imaging Review Dg Abd Acute W/chest  07/31/2015  CLINICAL DATA:  Abdominal pain, diarrhea, nausea EXAM: DG ABDOMEN ACUTE W/ 1V CHEST COMPARISON:  CT abdomen pelvis dated 09/27/2014 FINDINGS: Lungs are clear.  No pleural effusion or pneumothorax. Mild cardiomegaly. Nonobstructive bowel gas pattern. No evidence of free air on the  lateral decubitus view. Cholecystectomy clips. IMPRESSION: No evidence of acute cardiopulmonary disease. No evidence of small bowel obstruction or free air. Electronically Signed   By: Julian Hy M.D.   On: 07/31/2015 17:51   I have personally reviewed and evaluated these images and lab results as part of my medical decision-making.   EKG Interpretation None      MDM   Final diagnoses:  Nausea vomiting and diarrhea  Gastroenteritis  UTI (lower urinary tract infection)  Generalized abdominal pain  35 y.o. female here with Nausea and diarrhea since last night. Some lower abdominal cramping intermittently which is currently resolved, but upon palpation she has some mild generalized abdominal tenderness without focal tenderness or peritoneal signs. Mucous membranes dry, appers unkempt. CBC, CMP, and lipase all unremarkable. U/A and Upreg pending. She is wheelchair-bound and cannot feel below the waist, and has a ureteroileal conduit so her urine specimen is likely to be colonized. Likely GI viral illness, but given her medical conditions and history of prior issues with constipation, will obtain acute abdominal series to ensure that there is no stool burden further up that is causing overflow diarrhea around it. Pt requests ibuprofen only, will give fluids and zofran for symptoms, and reassess once results return.   6:28 PM Pt feeling better, ready to go home. Upreg neg. U/A with +nitrites +leuks 6-30 WBC and many bacteria. Although she is likely colonized, given that she has +nitrites and she doesn't always have this I will proceed with fosfomycin once here (prior cultures grew out E.coli which was pansensitive); will add culture today as well. Acute abd series neg. Likely viral gastroenteritis causing symptoms, discussed hydration and use of home zofran, BRAT diet discussed, tylenol/motrin discussed. F/up with PCP in 1wk. I explained the diagnosis and have given explicit precautions to  return to the ER including for any other new or worsening symptoms. The patient understands and accepts the medical plan as it's been dictated and I have answered their questions. Discharge instructions concerning home care and prescriptions have been given. The patient is STABLE and is discharged to home in good condition.  BP 110/76 mmHg  Pulse 74  Temp(Src) 97.6 F (36.4 C) (Oral)  Resp 20  SpO2 96%  LMP 05/04/2015  Meds ordered this encounter  Medications  . sodium chloride 0.9 % bolus 1,000 mL    Sig:   . ondansetron (ZOFRAN) injection 4 mg    Sig:   . ibuprofen (ADVIL,MOTRIN) tablet 400 mg    Sig:   . DISCONTD: fosfomycin (MONUROL) packet 3 g    Sig:   . ciprofloxacin (CIPRO) 500 MG tablet    Sig: Take 1 tablet (500 mg total) by mouth 2 (two) times daily. One po bid x 3 days    Dispense:  6 tablet    Refill:  0    Order Specific Question:  Supervising Provider    Answer:  Noemi Chapel [[id number]]   ADDENDUM: Pt claims allergy to ALL mycins, although she can't recall exact reaction to each one, but states she can't take any mycins. Will switch to cipro x3 days instead.   [address] [place], PA-C 07/31/15 [place], MD 07/31/15 1901

## 2015-07-31 NOTE — Discharge Instructions (Signed)
Your urine appeared as it could be possibly infected, but you were given the antibiotics here to treat this and will not need further antibiotics. Your symptoms are likely from a viral gastroenteritis illness. Use home zofran as prescribed, as needed for nausea. Use tylenol or motrin as needed for pain. Stay well hydrated with small sips of fluids throughout the day. Follow a BRAT (banana-rice-applesauce-toast) diet as described below for the next 24-48 hours. The 'BRAT' diet is suggested, then progress to diet as tolerated as symptoms abate. Call your regular doctor if bloody stools, persistent diarrhea, vomiting, fever or abdominal pain. Follow up with your regular doctor in 1 week for recheck of symptoms. Return to ER for changing or worsening of symptoms.  Food Choices to Help Relieve Diarrhea When you have diarrhea, the foods you eat and your eating habits are very important. Choosing the right foods and drinks can help relieve diarrhea. Also, because diarrhea can last up to 7 days, you need to replace lost fluids and electrolytes (such as sodium, potassium, and chloride) in order to help prevent dehydration.  WHAT GENERAL GUIDELINES DO I NEED TO FOLLOW?  Slowly drink 1 cup (8 oz) of fluid for each episode of diarrhea. If you are getting enough fluid, your urine will be clear or pale yellow.  Eat starchy foods. Some good choices include white rice, white toast, pasta, low-fiber cereal, baked potatoes (without the skin), saltine crackers, and bagels.  Avoid large servings of any cooked vegetables.  Limit fruit to two servings per day. A serving is  cup or 1 small piece.  Choose foods with less than 2 g of fiber per serving.  Limit fats to less than 8 tsp (38 g) per day.  Avoid fried foods.  Eat foods that have probiotics in them. Probiotics can be found in certain dairy products.  Avoid foods and beverages that may increase the speed at which food moves through the stomach and intestines  (gastrointestinal tract). Things to avoid include:  High-fiber foods, such as dried fruit, raw fruits and vegetables, nuts, seeds, and whole grain foods.  Spicy foods and high-fat foods.  Foods and beverages sweetened with high-fructose corn syrup, honey, or sugar alcohols such as xylitol, sorbitol, and mannitol. WHAT FOODS ARE RECOMMENDED? Grains White rice. White, [place], or pita breads (fresh or toasted), including plain rolls, buns, or bagels. White pasta. Saltine, soda, or graham crackers. Pretzels. Low-fiber cereal. Cooked cereals made with water (such as cornmeal, farina, or cream cereals). Plain muffins. Matzo. Melba toast. Zwieback.  Vegetables Potatoes (without the skin). Strained tomato and vegetable juices. Most well-cooked and canned vegetables without seeds. Tender lettuce. Fruits Cooked or canned applesauce, apricots, cherries, fruit cocktail, grapefruit, peaches, pears, or plums. Fresh bananas, apples without skin, cherries, grapes, cantaloupe, grapefruit, peaches, oranges, or plums.  Meat and Other Protein Products Baked or boiled chicken. Eggs. Tofu. Fish. Seafood. Smooth peanut butter. Ground or well-cooked tender beef, ham, veal, lamb, pork, or poultry.  Dairy Plain yogurt, kefir, and unsweetened liquid yogurt. Lactose-free milk, buttermilk, or soy milk. Plain hard cheese. Beverages Sport drinks. Clear broths. Diluted fruit juices (except prune). Regular, caffeine-free sodas such as ginger ale. Water. Decaffeinated teas. Oral rehydration solutions. Sugar-free beverages not sweetened with sugar alcohols. Other Bouillon, broth, or soups made from recommended foods.  The items listed above may not be a complete list of recommended foods or beverages. Contact your dietitian for more options. WHAT FOODS ARE NOT RECOMMENDED? Grains Whole grain, whole wheat, bran, or rye  breads, rolls, pastas, crackers, and cereals. Wild or brown rice. Cereals that contain more than 2 g of fiber  per serving. Corn tortillas or taco shells. Cooked or dry oatmeal. Granola. Popcorn. Vegetables Raw vegetables. Cabbage, broccoli, Brussels sprouts, artichokes, baked beans, beet greens, corn, kale, legumes, peas, sweet potatoes, and yams. Potato skins. Cooked spinach and cabbage. Fruits Dried fruit, including raisins and dates. Raw fruits. Stewed or dried prunes. Fresh apples with skin, apricots, mangoes, pears, raspberries, and strawberries.  Meat and Other Protein Products Chunky peanut butter. Nuts and seeds. Beans and lentils. Berniece Salines.  Dairy High-fat cheeses. Milk, chocolate milk, and beverages made with milk, such as milk shakes. Cream. Ice cream. Sweets and Desserts Sweet rolls, doughnuts, and sweet breads. Pancakes and waffles. Fats and Oils Butter. Cream sauces. Margarine. Salad oils. Plain salad dressings. Olives. Avocados.  Beverages Caffeinated beverages (such as coffee, tea, soda, or energy drinks). Alcoholic beverages. Fruit juices with pulp. Prune juice. Soft drinks sweetened with high-fructose corn syrup or sugar alcohols. Other Coconut. Hot sauce. Chili powder. Mayonnaise. Gravy. Cream-based or milk-based soups.  The items listed above may not be a complete list of foods and beverages to avoid. Contact your dietitian for more information. WHAT SHOULD I DO IF I BECOME DEHYDRATED? Diarrhea can sometimes lead to dehydration. Signs of dehydration include dark urine and dry mouth and skin. If you think you are dehydrated, you should rehydrate with an oral rehydration solution. These solutions can be purchased at pharmacies, retail stores, or online.  Drink -1 cup (120-240 mL) of oral rehydration solution each time you have an episode of diarrhea. If drinking this amount makes your diarrhea worse, try drinking smaller amounts more often. For example, drink 1-3 tsp (5-15 mL) every 5-10 minutes.  A general rule for staying hydrated is to drink 1-2 L of fluid per day. Talk to your  health care provider about the specific amount you should be drinking each day. Drink enough fluids to keep your urine clear or pale yellow. Document Released: 06/09/2003 Document Revised: 03/24/2013 Document Reviewed: 02/09/2013 [facility] Patient Information 2015 [place], [place]. This information is not intended to replace advice given to you by your health care provider. Make sure you discuss any questions you have with your health care provider.   Abdominal Pain, Adult Many things can cause belly (abdominal) pain. Most times, the belly pain is not dangerous. Many cases of belly pain can be watched and treated at home. HOME CARE   Do not take medicines that help you go poop (laxatives) unless told to by your doctor.  Only take medicine as told by your doctor.  Eat or drink as told by your doctor. Your doctor will tell you if you should be on a special diet. GET HELP IF:  You do not know what is causing your belly pain.  You have belly pain while you are sick to your stomach (nauseous) or have runny poop (diarrhea).  You have pain while you pee or poop.  Your belly pain wakes you up at night.  You have belly pain that gets worse or better when you eat.  You have belly pain that gets worse when you eat fatty foods.  You have a fever. GET HELP RIGHT AWAY IF:   The pain does not go away within 2 hours.  You keep throwing up (vomiting).  The pain changes and is only in the right or left part of the belly.  You have bloody or tarry looking poop. MAKE SURE  YOU:   Understand these instructions.  Will watch your condition.  Will get help right away if you are not doing well or get worse.   This information is not intended to replace advice given to you by your health care provider. Make sure you discuss any questions you have with your health care provider.   Document Released: 09/05/2007 Document Revised: 04/09/2014 Document Reviewed: 11/26/2012 Elsevier Interactive Patient  Education 2016 [place].  Diarrhea Diarrhea is watery poop (stool). It can make you feel weak, tired, thirsty, or give you a dry mouth (signs of dehydration). Watery poop is a sign of another problem, most often an infection. It often lasts 2-3 days. It can last longer if it is a sign of something serious. Take care of yourself as told by your doctor. HOME CARE   Drink 1 cup (8 ounces) of fluid each time you have watery poop.  Do not drink the following fluids:  Those that contain simple sugars (fructose, glucose, galactose, lactose, sucrose, maltose).  Sports drinks.  Fruit juices.  Whole milk products.  Sodas.  Drinks with caffeine (coffee, tea, soda) or alcohol.  Oral rehydration solution may be used if the doctor says it is okay. You may make your own solution. Follow this recipe:   - teaspoon table salt.   teaspoon baking soda.   teaspoon salt substitute containing potassium chloride.  1 tablespoons sugar.  1 liter (34 ounces) of water.  Avoid the following foods:  High fiber foods, such as raw fruits and vegetables.  Nuts, seeds, and whole grain breads and cereals.   Those that are sweetened with sugar alcohols (xylitol, sorbitol, mannitol).  Try eating the following foods:  Starchy foods, such as rice, toast, pasta, low-sugar cereal, oatmeal, baked potatoes, crackers, and bagels.  Bananas.  Applesauce.  Eat probiotic-rich foods, such as yogurt and milk products that are fermented.  Wash your hands well after each time you have watery poop.  Only take medicine as told by your doctor.  Take a warm bath to help lessen burning or pain from having watery poop. GET HELP RIGHT AWAY IF:   You cannot drink fluids without throwing up (vomiting).  You keep throwing up.  You have blood in your poop, or your poop looks black and tarry.  You do not pee (urinate) in 6-8 hours, or there is only a small amount of very dark pee.  You have belly  (abdominal) pain that gets worse or stays in the same spot (localizes).  You are weak, dizzy, confused, or light-headed.  You have a very bad headache.  Your watery poop gets worse or does not get better.  You have a fever or lasting symptoms for more than 2-3 days.  You have a fever and your symptoms suddenly get worse. MAKE SURE YOU:   Understand these instructions.  Will watch your condition.  Will get help right away if you are not doing well or get worse.   This information is not intended to replace advice given to you by your health care provider. Make sure you discuss any questions you have with your health care provider.   Document Released: 09/05/2007 Document Revised: 04/09/2014 Document Reviewed: 11/25/2011 Elsevier Interactive Patient Education 2016 Elsevier Inc.  Nausea and Vomiting Nausea means you feel sick to your stomach. Throwing up (vomiting) is a reflex where stomach contents come out of your mouth. HOME CARE   Take medicine as told by your doctor.  Do not force yourself to  eat. However, you do need to drink fluids.  If you feel like eating, eat a normal diet as told by your doctor.  Eat rice, wheat, potatoes, bread, lean meats, yogurt, fruits, and vegetables.  Avoid high-fat foods.  Drink enough fluids to keep your pee (urine) clear or pale yellow.  Ask your doctor how to replace body fluid losses (rehydrate). Signs of body fluid loss (dehydration) include:  Feeling very thirsty.  Dry lips and mouth.  Feeling dizzy.  Dark pee.  Peeing less than normal.  Feeling confused.  Fast breathing or heart rate. GET HELP RIGHT AWAY IF:   You have blood in your throw up.  You have black or bloody poop (stool).  You have a bad headache or stiff neck.  You feel confused.  You have bad belly (abdominal) pain.  You have chest pain or trouble breathing.  You do not pee at least once every 8 hours.  You have cold, clammy skin.  You keep  throwing up after 24 to 48 hours.  You have a fever. MAKE SURE YOU:   Understand these instructions.  Will watch your condition.  Will get help right away if you are not doing well or get worse.   This information is not intended to replace advice given to you by your health care provider. Make sure you discuss any questions you have with your health care provider.   Document Released: 09/05/2007 Document Revised: 06/11/2011 Document Reviewed: 08/18/2010 Elsevier Interactive Patient Education [employer].

## 2015-08-02 LAB — URINE CULTURE

## 2016-01-05 ENCOUNTER — Other Ambulatory Visit: Payer: Self-pay | Admitting: Internal Medicine

## 2016-01-05 ENCOUNTER — Encounter (HOSPITAL_BASED_OUTPATIENT_CLINIC_OR_DEPARTMENT_OTHER): Payer: Medicaid Other | Attending: Internal Medicine

## 2016-01-05 DIAGNOSIS — G8220 Paraplegia, unspecified: Secondary | ICD-10-CM | POA: Insufficient documentation

## 2016-01-05 DIAGNOSIS — L89324 Pressure ulcer of left buttock, stage 4: Secondary | ICD-10-CM | POA: Insufficient documentation

## 2016-01-05 DIAGNOSIS — G35 Multiple sclerosis: Secondary | ICD-10-CM | POA: Insufficient documentation

## 2016-01-05 DIAGNOSIS — Z936 Other artificial openings of urinary tract status: Secondary | ICD-10-CM | POA: Insufficient documentation

## 2016-01-07 ENCOUNTER — Emergency Department (HOSPITAL_COMMUNITY): Payer: Medicaid Other

## 2016-01-07 ENCOUNTER — Observation Stay (HOSPITAL_COMMUNITY)
Admission: EM | Admit: 2016-01-07 | Discharge: 2016-01-09 | Disposition: A | Discharge status: Home / Self Care | Payer: Medicaid Other | Attending: Family Medicine | Admitting: Family Medicine

## 2016-01-07 ENCOUNTER — Encounter (HOSPITAL_COMMUNITY): Payer: Self-pay | Admitting: Oncology

## 2016-01-07 DIAGNOSIS — N390 Urinary tract infection, site not specified: Secondary | ICD-10-CM | POA: Diagnosis present

## 2016-01-07 DIAGNOSIS — R112 Nausea with vomiting, unspecified: Secondary | ICD-10-CM

## 2016-01-07 DIAGNOSIS — R509 Fever, unspecified: Secondary | ICD-10-CM | POA: Diagnosis present

## 2016-01-07 DIAGNOSIS — Z79899 Other long term (current) drug therapy: Secondary | ICD-10-CM | POA: Diagnosis not present

## 2016-01-07 DIAGNOSIS — G8220 Paraplegia, unspecified: Secondary | ICD-10-CM | POA: Diagnosis not present

## 2016-01-07 DIAGNOSIS — G43A0 Cyclical vomiting, not intractable: Secondary | ICD-10-CM | POA: Diagnosis not present

## 2016-01-07 DIAGNOSIS — Y733 Surgical instruments, materials and gastroenterology and urology devices (including sutures) associated with adverse incidents: Secondary | ICD-10-CM | POA: Insufficient documentation

## 2016-01-07 DIAGNOSIS — T83511A Infection and inflammatory reaction due to indwelling urethral catheter, initial encounter: Secondary | ICD-10-CM | POA: Diagnosis not present

## 2016-01-07 DIAGNOSIS — Z91040 Latex allergy status: Secondary | ICD-10-CM | POA: Diagnosis not present

## 2016-01-07 DIAGNOSIS — L8990 Pressure ulcer of unspecified site, unspecified stage: Secondary | ICD-10-CM | POA: Diagnosis present

## 2016-01-07 LAB — URINE MICROSCOPIC-ADD ON
RBC / HPF: NONE SEEN RBC/hpf (ref 0–5)
Squamous Epithelial / LPF: NONE SEEN

## 2016-01-07 LAB — COMPREHENSIVE METABOLIC PANEL
ALBUMIN: 3.7 g/dL (ref 3.5–5.0)
ALT: 9 U/L — ABNORMAL LOW (ref 14–54)
ANION GAP: 9 (ref 5–15)
AST: 14 U/L — ABNORMAL LOW (ref 15–41)
Alkaline Phosphatase: 108 U/L (ref 38–126)
BUN: 9 mg/dL (ref 6–20)
CO2: 26 mmol/L (ref 22–32)
Calcium: 8.7 mg/dL — ABNORMAL LOW (ref 8.9–10.3)
Chloride: 99 mmol/L — ABNORMAL LOW (ref 101–111)
Creatinine, Ser: 0.42 mg/dL — ABNORMAL LOW (ref 0.44–1.00)
GFR calc Af Amer: >60 mL/min (ref >60)
GFR calc non Af Amer: >60 mL/min (ref >60)
GLUCOSE: 100 mg/dL — AB (ref 65–99)
POTASSIUM: 3.3 mmol/L — AB (ref 3.5–5.1)
SODIUM: 134 mmol/L — AB (ref 135–145)
Total Bilirubin: 0.7 mg/dL (ref 0.3–1.2)
Total Protein: 6.9 g/dL (ref 6.5–8.1)

## 2016-01-07 LAB — CBC WITH DIFFERENTIAL/PLATELET
Basophils Absolute: 0 10*3/uL (ref 0.0–0.1)
Basophils Relative: 0 %
Eosinophils Absolute: 0 10*3/uL (ref 0.0–0.7)
Eosinophils Relative: 0 %
HEMATOCRIT: 40.8 % (ref 36.0–46.0)
HEMOGLOBIN: 13.1 g/dL (ref 12.0–15.0)
LYMPHS ABS: 1.3 10*3/uL (ref 0.7–4.0)
Lymphocytes Relative: 14 %
MCH: 26.4 pg (ref 26.0–34.0)
MCHC: 32.1 g/dL (ref 30.0–36.0)
MCV: 82.1 fL (ref 78.0–100.0)
MONOS PCT: 5 %
Monocytes Absolute: 0.5 10*3/uL (ref 0.1–1.0)
NEUTROS ABS: 7.7 10*3/uL (ref 1.7–7.7)
NEUTROS PCT: 81 %
Platelets: 294 10*3/uL (ref 150–400)
RBC: 4.97 MIL/uL (ref 3.87–5.11)
RDW: 16.7 % — ABNORMAL HIGH (ref 11.5–15.5)
WBC: 9.6 10*3/uL (ref 4.0–10.5)

## 2016-01-07 LAB — I-STAT CG4 LACTIC ACID, ED
LACTIC ACID, VENOUS: 0.36 mmol/L — AB (ref 0.5–1.9)
Lactic Acid, Venous: 0.96 mmol/L (ref 0.5–1.9)

## 2016-01-07 LAB — URINALYSIS, ROUTINE W REFLEX MICROSCOPIC
Glucose, UA: NEGATIVE mg/dL
HGB URINE DIPSTICK: NEGATIVE
Ketones, ur: NEGATIVE mg/dL
NITRITE: POSITIVE — AB
PH: 8.5 — AB (ref 5.0–8.0)
Protein, ur: 100 mg/dL — AB
SPECIFIC GRAVITY, URINE: 1.02 (ref 1.005–1.030)

## 2016-01-07 MED ORDER — LAMOTRIGINE 25 MG PO TABS
125.0000 mg | ORAL_TABLET | Freq: Every day | ORAL | Status: DC
  Administered 2016-01-07 – 2016-01-09 (×3): 125 mg via ORAL
  Filled 2016-01-07 (×3): qty 1

## 2016-01-07 MED ORDER — POTASSIUM CHLORIDE 10 MEQ/100ML IV SOLN
10.0000 meq | Freq: Once | INTRAVENOUS | Status: AC
  Administered 2016-01-07: 10 meq via INTRAVENOUS
  Filled 2016-01-07: qty 100

## 2016-01-07 MED ORDER — BACLOFEN 20 MG PO TABS
20.0000 mg | ORAL_TABLET | Freq: Three times a day (TID) | ORAL | Status: DC
  Administered 2016-01-07 – 2016-01-09 (×7): 20 mg via ORAL
  Filled 2016-01-07 (×7): qty 1

## 2016-01-07 MED ORDER — DIAZEPAM 5 MG PO TABS: 5.0000 mg | ORAL_TABLET | Freq: Three times a day (TID) | ORAL | Status: DC

## 2016-01-07 MED ORDER — DIAZEPAM 5 MG PO TABS
5.0000 mg | ORAL_TABLET | Freq: Two times a day (BID) | ORAL | Status: DC
  Administered 2016-01-07 – 2016-01-09 (×4): 5 mg via ORAL
  Filled 2016-01-07 (×5): qty 1

## 2016-01-07 MED ORDER — DEXTROSE 5 % IV SOLN
1.0000 g | Freq: Once | INTRAVENOUS | Status: AC
  Administered 2016-01-07: 1 g via INTRAVENOUS
  Filled 2016-01-07: qty 10

## 2016-01-07 MED ORDER — ENOXAPARIN SODIUM 40 MG/0.4ML ~~LOC~~ SOLN
40.0000 mg | SUBCUTANEOUS | Status: DC
  Administered 2016-01-07 – 2016-01-08 (×2): 40 mg via SUBCUTANEOUS
  Filled 2016-01-07 (×2): qty 0.4

## 2016-01-07 MED ORDER — DEXTROSE 5 % IV SOLN
1.0000 g | INTRAVENOUS | Status: DC
  Administered 2016-01-08: 1 g via INTRAVENOUS
  Filled 2016-01-07 (×2): qty 10

## 2016-01-07 MED ORDER — SODIUM CHLORIDE 0.9 % IV SOLN
INTRAVENOUS | Status: DC
  Administered 2016-01-07 – 2016-01-08 (×3): via INTRAVENOUS

## 2016-01-07 MED ORDER — ONDANSETRON HCL 4 MG/2ML IJ SOLN
4.0000 mg | Freq: Once | INTRAMUSCULAR | Status: AC
  Administered 2016-01-07: 4 mg via INTRAVENOUS
  Filled 2016-01-07: qty 2

## 2016-01-07 MED ORDER — PANTOPRAZOLE SODIUM 40 MG PO TBEC
40.0000 mg | DELAYED_RELEASE_TABLET | Freq: Every day | ORAL | Status: DC
  Administered 2016-01-07 – 2016-01-09 (×3): 40 mg via ORAL
  Filled 2016-01-07 (×3): qty 1

## 2016-01-07 MED ORDER — ONDANSETRON HCL 4 MG/2ML IJ SOLN
4.0000 mg | Freq: Four times a day (QID) | INTRAMUSCULAR | Status: DC | PRN
  Administered 2016-01-07: 4 mg via INTRAVENOUS
  Filled 2016-01-07: qty 2

## 2016-01-07 MED ORDER — BISACODYL 10 MG RE SUPP: 10.0000 mg | Freq: Every day | RECTAL | Status: DC | PRN

## 2016-01-07 MED ORDER — SODIUM CHLORIDE 0.9 % IV BOLUS (SEPSIS)
1000.0000 mL | Freq: Once | INTRAVENOUS | Status: AC
  Administered 2016-01-07: 1000 mL via INTRAVENOUS

## 2016-01-07 MED ORDER — ONDANSETRON HCL 4 MG PO TABS: 4.0000 mg | ORAL_TABLET | Freq: Four times a day (QID) | ORAL | Status: DC | PRN

## 2016-01-07 MED ORDER — LUBIPROSTONE 24 MCG PO CAPS
24.0000 ug | ORAL_CAPSULE | Freq: Two times a day (BID) | ORAL | Status: DC
  Administered 2016-01-07 – 2016-01-09 (×4): 24 ug via ORAL
  Filled 2016-01-07 (×4): qty 1

## 2016-01-07 MED ORDER — GABAPENTIN 400 MG PO CAPS
800.0000 mg | ORAL_CAPSULE | Freq: Three times a day (TID) | ORAL | Status: DC
  Administered 2016-01-07 – 2016-01-09 (×7): 800 mg via ORAL
  Filled 2016-01-07 (×7): qty 2

## 2016-01-07 MED ORDER — SERTRALINE HCL 100 MG PO TABS
100.0000 mg | ORAL_TABLET | Freq: Every day | ORAL | Status: DC
  Administered 2016-01-07 – 2016-01-09 (×3): 100 mg via ORAL
  Filled 2016-01-07 (×3): qty 1

## 2016-01-07 MED ORDER — DIAZEPAM 5 MG PO TABS
10.0000 mg | ORAL_TABLET | Freq: Every day | ORAL | Status: DC
  Administered 2016-01-07 – 2016-01-08 (×2): 10 mg via ORAL
  Filled 2016-01-07 (×2): qty 2

## 2016-01-07 NOTE — ED Triage Notes (Signed)
Pt had a debridement of a decubitus ulcer on her sacrum on Friday.  D/c instructions stated pt needed to come to the ED if she developed a fever.  Per pt's caregiver she gave pt tylenol at 0100.  +nausea.

## 2016-01-07 NOTE — ED Provider Notes (Signed)
[facility] DEPT Provider Note   CSN: [id number] Arrival date & time: 01/07/16  0242     History   Chief Complaint Chief Complaint  Patient presents with  . Fever    HPI [name] is a 35 y.o. female.  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 Patient has a left buttock decubitus ulcer for the past month which has been cared for at home. She recently had a surgical debridement of the wound 2 days ago by wound care specialist. Later on that day patient report feeling nauseous, fatigue, vomited 3 separate occasions and subsequently had a fever of 101.3. She did receive Tylenol prior to arrival which seems to help. Per mom, aside from runny nose she had for more than a week, no other significant changes. However, mom has noticed that patient's urine has been dark for the past 4 days. Despite changing the urostomy bag, urine remains dark. Patient has tried to stay hydrated without adequate improvement. Patient currently denies having headache, sneezing, coughing, sore throat, chest pain, shortness of breath, abdominal pain or back pain. Mom noticed that patient is less active than baseline.  Past Medical History:  Diagnosis Date  . Bladder stones    Hx of  . Depression   . GERD (gastroesophageal reflux disease)   . Multiple sclerosis ([place])   . Paraplegia ([place])   . PTSD (post-traumatic stress disorder)   . Transverse myelitis [facility])     Patient Active Problem List   Diagnosis Date Noted  . Ileus ([place]) 10/29/2014  . Constipation 10/29/2014  . Obstipation 09/27/2014  . UTI (urinary tract infection) 03/31/2013  . Influenza 03/30/2013  . Nausea and vomiting 03/30/2013  . Hypokalemia 02/16/2012  . Multiple sclerosis ([place]) 10/16/2011  . Paraplegia [facility])     Past Surgical History:  Procedure Laterality Date  . CHOLECYSTECTOMY    . CYSTECTOMY W/ URETEROILEAL  CONDUIT    . TONSILLECTOMY    . WISDOM TOOTH EXTRACTION      OB History    No data available       Home Medications    Prior to Admission medications   Medication Sig Start Date End Date Taking? Authorizing Provider  baclofen (LIORESAL) 20 MG tablet Take 20 mg by mouth 3 (three) times daily.   Yes Historical Provider, MD  bisacodyl (DULCOLAX) 10 MG suppository Place 1 suppository (10 mg total) rectally daily as needed for moderate constipation. 10/31/14  Yes Nishant Dhungel, MD  diazepam (VALIUM) 5 MG tablet Take 1-2 tablets (5-10 mg total) by mouth every 8 (eight) hours. Take 1 tablet in AM, 1 tablet in PM, and 2 tablets at bedtime Patient taking differently: Take 5-10 mg by mouth 3 (three) times daily. Take 1 tablet in AM, 1 tablet in PM, and 2 tablets at bedtime 04/02/13  Yes Robbie Lis, MD  fluticasone [facility]) 50 MCG/ACT nasal spray Place 2 sprays into the nose daily as needed for allergies.    Yes Historical Provider, MD  gabapentin (NEURONTIN) 400 MG capsule Take 800 mg by mouth 3 (three) times daily. 01/15/14  Yes Historical Provider, MD  ibuprofen (ADVIL,MOTRIN) 200 MG tablet Take 400 mg by mouth every 6 (six) hours as needed for headache, mild pain or moderate pain.    Yes Historical Provider, MD  lamoTRIgine (LAMICTAL) 25 MG tablet Take 125 mg by mouth daily.   Yes Historical Provider,  MD  loratadine (CLARITIN) 10 MG tablet Take 10 mg by mouth daily.   Yes Historical Provider, MD  lubiprostone (AMITIZA) 24 MCG capsule Take 24 mcg by mouth 2 (two) times daily with a meal.   Yes Historical Provider, MD  omeprazole (PRILOSEC) 40 MG capsule Take 40 mg by mouth daily.   Yes Historical Provider, MD  senna-docusate (SENOKOT-S) 8.6-50 MG per tablet Take 1 tablet by mouth 2 (two) times daily. Patient taking differently: Take 1 tablet by mouth 2 (two) times daily as needed for mild constipation or moderate constipation.  10/31/14  Yes Nishant Dhungel, MD  sertraline (ZOLOFT) 100 MG  tablet Take 100 mg by mouth daily.   Yes Historical Provider, MD  ciprofloxacin (CIPRO) 500 MG tablet Take 1 tablet (500 mg total) by mouth 2 (two) times daily. One po bid x 3 days Patient not taking: Reported on 01/07/2016 07/31/15   Mercedes Camprubi-Soms, PA-C  magnesium citrate SOLN Take 148 mLs (0.5 Bottles total) by mouth as needed for severe constipation. Patient not taking: Reported on 07/31/2015 09/28/14   Albertine Patricia, MD    Family History Family History  Problem Relation Age of Onset  . Diabetes type II Father   . CAD Father   . Dementia Father   . Diabetes type II Mother   . Hypertension Mother   . Breast cancer Other   . Diabetes type II Other     Social History Social History  Substance Use Topics  . Smoking status: Never Smoker  . Smokeless tobacco: Never Used  . Alcohol use No     Allergies   Other; Aspirin; Iodine; Latex; Shellfish-derived products; Strawberry extract; and Vancomycin   Review of Systems Review of Systems  All other systems reviewed and are negative.    Physical Exam Updated Vital Signs BP 109/75   Pulse 79   Temp 98.3 F (36.8 C) (Oral)   Resp 12   Ht 5\' 3"  (1.6 m)   Wt 65.8 kg   LMP 12/04/2015 (Approximate)   SpO2 96%   BMI 25.69 kg/m   Physical Exam  Constitutional: She is oriented to person, place, and time.  Chronically ill-appearing female laying in bed  HENT:  Mouth/Throat: Oropharynx is clear and moist.  Eyes: EOM are normal. Pupils are equal, round, and reactive to light.  Neck: Normal range of motion. Neck supple.  Cardiovascular: Normal rate and regular rhythm.   Pulmonary/Chest: Effort normal and breath sounds normal.  Abdominal: Soft. There is no tenderness.  Genitourinary:  Genitourinary Comments: Chaperone present during exam. Recently debrided left buttock decubitus ulcer without any discharge or concerning feature.  Neurological: She is alert and oriented to person, place, and time.  Nursing note and  vitals reviewed.    ED Treatments / Results  Labs (all labs ordered are listed, but only abnormal results are displayed) Labs Reviewed  COMPREHENSIVE METABOLIC PANEL - Abnormal; Notable for the following:       Result Value   Sodium 134 (*)    Potassium 3.3 (*)    Chloride 99 (*)    Glucose, Bld 100 (*)    Creatinine, Ser 0.42 (*)    Calcium 8.7 (*)    AST 14 (*)    ALT 9 (*)    All other components within normal limits  CBC WITH DIFFERENTIAL/PLATELET - Abnormal; Notable for the following:    RDW 16.7 (*)    All other components within normal limits  URINALYSIS, ROUTINE W REFLEX MICROSCOPIC (  NOT AT [facility]) - Abnormal; Notable for the following:    Color, Urine RED (*)    APPearance TURBID (*)    pH 8.5 (*)    Bilirubin Urine SMALL (*)    Protein, ur 100 (*)    Nitrite POSITIVE (*)    Leukocytes, UA SMALL (*)    All other components within normal limits  URINE MICROSCOPIC-ADD ON - Abnormal; Notable for the following:    Bacteria, UA MANY (*)    Crystals TRIPLE PHOSPHATE CRYSTALS (*)    All other components within normal limits  I-STAT CG4 LACTIC ACID, ED - Abnormal; Notable for the following:    Lactic Acid, Venous 0.36 (*)    All other components within normal limits  CULTURE, BLOOD (ROUTINE X 2)  CULTURE, BLOOD (ROUTINE X 2)  URINE CULTURE  I-STAT CG4 LACTIC ACID, ED    EKG  EKG Interpretation None       Radiology Dg Chest 2 View  Result Date: 01/07/2016 CLINICAL DATA:  Acute onset of fever and nausea.  Initial encounter. EXAM: CHEST  2 VIEW COMPARISON:  Chest radiograph performed 07/31/2015 FINDINGS: The lungs are well-aerated and clear. There is no evidence of focal opacification, pleural effusion or pneumothorax. The heart is normal in size; the mediastinal contour is within normal limits. No acute osseous abnormalities are seen. IMPRESSION: No acute cardiopulmonary process seen. Electronically Signed   By: Garald Balding M.D.   On: 01/07/2016 04:32     Procedures Procedures (including critical care time)  Medications Ordered in ED Medications  sodium chloride 0.9 % bolus 1,000 mL (not administered)  ondansetron (ZOFRAN) injection 4 mg (not administered)  potassium chloride 10 mEq in 100 mL IVPB (not administered)  cefTRIAXone (ROCEPHIN) 1 g in dextrose 5 % 50 mL IVPB (0 g Intravenous Stopped 01/07/16 0719)     Initial Impression / Assessment and Plan / ED Course  I have reviewed the triage vital signs and the nursing notes.  Pertinent labs & imaging results that were available during my care of the patient were reviewed by me and considered in my medical decision making (see chart for details).  Clinical Course    BP 103/67 (BP Location: Right Arm)   Pulse 74   Temp 97.9 F (36.6 C) (Oral)   Resp 15   Ht 5\' 3"  (1.6 m)   Wt 65.8 kg   LMP 12/04/2015 (Approximate)   SpO2 99%   BMI 25.69 kg/m    Final Clinical Impressions(s) / ED Diagnoses   Final diagnoses:  Non-intractable vomiting with nausea, unspecified vomiting type  Urinary tract infection associated with indwelling urethral catheter, initial encounter [facility])    New Prescriptions New Prescriptions   No medications on file   6:33 AM Paraplegic patient with urostomy access Presenting for fever, nausea and vomiting. Pertinent information includes recent left buttock decubitus wound debridement 2 days ago.  She also has darker urine than normal, which may suggest UTI, difficult to assess as her urine often appears infected.  Pt does not have a fever here, however due to having worsening fatigue, nausea and vomiting, plan to treat for suspect UTI with IV rocephin and admit for obs.    8:21 AM Appreciate consultation from [facility] Dr. Darrick Meigs who agrees to see pt in the ER and will admit for obs. Care discussed with DR. Molpus.    Domenic Moras, PA-C 01/07/16 [place], MD 01/07/16 2300

## 2016-01-07 NOTE — Progress Notes (Signed)
Pharmacy Antibiotic Note  [name] is a 35 y.o. female admitted on 01/07/2016 with UTI.  Pharmacy has been consulted for ceftriaxone dosing. Febrile from UTI and/or viral gastroenteritis. H/o paraplegia  Plan:  Ceftriaxone 1gm IV q24h  No renal dose adjustment required so pharmacy will sign-off at this time  Height: 5\' 3"  (160 cm) Weight: 145 lb (65.8 kg) IBW/kg (Calculated) : 52.4  Temp (24hrs), Avg:97.9 F (36.6 C), Min:97.5 F (36.4 C), Max:98.3 F (36.8 C)   Recent Labs Lab 01/07/16 0416 01/07/16 0421 01/07/16 0656  WBC 9.6  --   --   CREATININE 0.42*  --   --   LATICACIDVEN  --  0.96 0.36*    Estimated Creatinine Clearance: 90.4 mL/min (by C-G formula based on SCr of 0.42 mg/dL (L)).    Allergies  Allergen Reactions  . Other Anaphylaxis    ALL -MYCINS  . Aspirin Other (See Comments)    Reaction unknown  . Iodine Other (See Comments)    Reaction unknown  . Latex Other (See Comments)    Reaction unknown  . Shellfish-Derived Products Other (See Comments)    Reaction unknown  . Strawberry Extract Hives  . Vancomycin Other (See Comments)    Reaction unknown    Antimicrobials this admission: 10/7 >> ceftriaxone >>  Dose adjustments this admission:  Microbiology results: 10/7 BCx:  10/7 UCx:    Thank you for allowing pharmacy to be a part of this patient's care.  Doreene Eland, PharmD, BCPS.   Pager: [phone] 01/07/2016 10:11 AM

## 2016-01-07 NOTE — H&P (Signed)
TRH H&P    Patient Demographics:    [name], is a 35 y.o. female  MRN: [id number]  DOB - [birth year]  Admit Date - 01/07/2016  Referring MD/NP/PA: Domenic Moras  Outpatient Primary MD for the patient is Simona Huh, MD  Patient coming from: home  Chief Complaint  Patient presents with  . Fever      HPI:    [name]  is a 35 y.o. female, With history of transverse myelitis, paraplegia, status post urostomy with recurrent UTI came to the ED with fever, and 2 days  history of nausea vomiting and diarrhea. As per mother, patient had subsequent debridement of sacral decubitus ulcer 2 days ago and after that patient started having diarrhea with vomiting. Both these symptoms are resolved at this time, patient developed fever 101.3 at home and she did get Tylenol before coming to the ED. Patient denies any chest pain, no shortness of breath. No abdominal pain. Mother noted that urine was dark in color.  In the ED UA is abnormal, lactate 0.96. Patient will be admitted for dehydration and UTI    Review of systems:      A full 10 point Review of Systems was done, except as stated above, all other Review of Systems were negative.   With Past History of the following :    Past Medical History:  Diagnosis Date  . Bladder stones    Hx of  . Depression   . GERD (gastroesophageal reflux disease)   . Multiple sclerosis ([place])   . Paraplegia ([place])   . PTSD (post-traumatic stress disorder)   . Transverse myelitis [facility])       Past Surgical History:  Procedure Laterality Date  . CHOLECYSTECTOMY    . CYSTECTOMY W/ URETEROILEAL CONDUIT    . TONSILLECTOMY    . WISDOM TOOTH EXTRACTION        Social History:      Social History  Substance Use Topics  . Smoking status: Never Smoker  . Smokeless tobacco: Never Used  . Alcohol use No       Family History :     Family History  Problem  Relation Age of Onset  . Diabetes type II Father   . CAD Father   . Dementia Father   . Diabetes type II Mother   . Hypertension Mother   . Breast cancer Other   . Diabetes type II Other       Home Medications:   Prior to Admission medications   Medication Sig Start Date End Date Taking? Authorizing Provider  baclofen (LIORESAL) 20 MG tablet Take 20 mg by mouth 3 (three) times daily.   Yes Historical Provider, MD  bisacodyl (DULCOLAX) 10 MG suppository Place 1 suppository (10 mg total) rectally daily as needed for moderate constipation. 10/31/14  Yes Nishant Dhungel, MD  diazepam (VALIUM) 5 MG tablet Take 1-2 tablets (5-10 mg total) by mouth every 8 (eight) hours. Take 1 tablet in AM, 1 tablet in PM, and 2 tablets at bedtime Patient taking differently: Take  5-10 mg by mouth 3 (three) times daily. Take 1 tablet in AM, 1 tablet in PM, and 2 tablets at bedtime 04/02/13  Yes Robbie Lis, MD  fluticasone [facility]) 50 MCG/ACT nasal spray Place 2 sprays into the nose daily as needed for allergies.    Yes Historical Provider, MD  gabapentin (NEURONTIN) 400 MG capsule Take 800 mg by mouth 3 (three) times daily. 01/15/14  Yes Historical Provider, MD  ibuprofen (ADVIL,MOTRIN) 200 MG tablet Take 400 mg by mouth every 6 (six) hours as needed for headache, mild pain or moderate pain.    Yes Historical Provider, MD  lamoTRIgine (LAMICTAL) 25 MG tablet Take 125 mg by mouth daily.   Yes Historical Provider, MD  loratadine (CLARITIN) 10 MG tablet Take 10 mg by mouth daily.   Yes Historical Provider, MD  lubiprostone (AMITIZA) 24 MCG capsule Take 24 mcg by mouth 2 (two) times daily with a meal.   Yes Historical Provider, MD  omeprazole (PRILOSEC) 40 MG capsule Take 40 mg by mouth daily.   Yes Historical Provider, MD  senna-docusate (SENOKOT-S) 8.6-50 MG per tablet Take 1 tablet by mouth 2 (two) times daily. Patient taking differently: Take 1 tablet by mouth 2 (two) times daily as needed for mild constipation  or moderate constipation.  10/31/14  Yes Nishant Dhungel, MD  sertraline (ZOLOFT) 100 MG tablet Take 100 mg by mouth daily.   Yes Historical Provider, MD  ciprofloxacin (CIPRO) 500 MG tablet Take 1 tablet (500 mg total) by mouth 2 (two) times daily. One po bid x 3 days Patient not taking: Reported on 01/07/2016 07/31/15   Mercedes Camprubi-Soms, PA-C  magnesium citrate SOLN Take 148 mLs (0.5 Bottles total) by mouth as needed for severe constipation. Patient not taking: Reported on 07/31/2015 09/28/14   Albertine Patricia, MD     Allergies:     Allergies  Allergen Reactions  . Other Anaphylaxis    ALL -MYCINS  . Aspirin Other (See Comments)    Reaction unknown  . Iodine Other (See Comments)    Reaction unknown  . Latex Other (See Comments)    Reaction unknown  . Shellfish-Derived Products Other (See Comments)    Reaction unknown  . Strawberry Extract Hives  . Vancomycin Other (See Comments)    Reaction unknown     Physical Exam:   Vitals  Blood pressure 104/70, pulse 77, temperature 97.5 F (36.4 C), temperature source Oral, resp. rate 16, height 5\' 3"  (1.6 m), weight 65.8 kg (145 lb), last menstrual period 12/04/2015, SpO2 100 %.  1.  General: Appears in mild distress  2. Psychiatric:  Intact judgement and  insight, awake alert, oriented x 3.  3. Neurologic: Paraplegia  4. Eyes :  anicteric sclerae, moist conjunctivae with no lid lag. PERRLA.  5. ENMT:  Oropharynx clear with moist mucous membranes and good dentition  6. Neck:  supple, no cervical lymphadenopathy appriciated, No thyromegaly  7. Respiratory : Normal respiratory effort, good air movement bilaterally,clear to  auscultation bilaterally  8. Cardiovascular : RRR, no gallops, rubs or murmurs, no leg edema  9. Gastrointestinal:  Positive bowel sounds, abdomen soft, non-tender to palpation,no hepatosplenomegaly, urostomy bag in place in right lower quadrant, no rigidity or guarding       10. Skin:    Large decubitus ulcers noted at sacrum, packing in place      Data Review:    CBC  Recent Labs Lab 01/07/16 0416  WBC 9.6  HGB 13.1  HCT  40.8  PLT 294  MCV 82.1  MCH 26.4  MCHC 32.1  RDW 16.7*  LYMPHSABS 1.3  MONOABS 0.5  EOSABS 0.0  BASOSABS 0.0   ------------------------------------------------------------------------------------------------------------------  Chemistries   Recent Labs Lab 01/07/16 0416  NA 134*  K 3.3*  CL 99*  CO2 26  GLUCOSE 100*  BUN 9  CREATININE 0.42*  CALCIUM 8.7*  AST 14*  ALT 9*  ALKPHOS 108  BILITOT 0.7   ------------------------------------------------------------------------------------------------------------------  ------------------------------------------------------------------------------------------------------------------ GFR: Estimated Creatinine Clearance: 90.4 mL/min (by C-G formula based on SCr of 0.42 mg/dL (L)). Liver Function Tests:  Recent Labs Lab 01/07/16 0416  AST 14*  ALT 9*  ALKPHOS 108  BILITOT 0.7  PROT 6.9  ALBUMIN 3.7    --------------------------------------------------------------------------------------------------------------- Urine analysis:    Component Value Date/Time   COLORURINE RED (A) 01/07/2016 0430   APPEARANCEUR TURBID (A) 01/07/2016 0430   LABSPEC 1.020 01/07/2016 0430   PHURINE 8.5 (H) 01/07/2016 0430   GLUCOSEU NEGATIVE 01/07/2016 0430   HGBUR NEGATIVE 01/07/2016 0430   BILIRUBINUR SMALL (A) 01/07/2016 0430   KETONESUR NEGATIVE 01/07/2016 0430   PROTEINUR 100 (A) 01/07/2016 0430   UROBILINOGEN 2.0 (H) 10/29/2014 1627   NITRITE POSITIVE (A) 01/07/2016 0430   LEUKOCYTESUR SMALL (A) 01/07/2016 0430      Imaging Results:    Dg Chest 2 View  Result Date: 01/07/2016 CLINICAL DATA:  Acute onset of fever and nausea.  Initial encounter. EXAM: CHEST  2 VIEW COMPARISON:  Chest radiograph performed 07/31/2015 FINDINGS: The lungs are well-aerated and clear. There is no  evidence of focal opacification, pleural effusion or pneumothorax. The heart is normal in size; the mediastinal contour is within normal limits. No acute osseous abnormalities are seen. IMPRESSION: No acute cardiopulmonary process seen. Electronically Signed   By: Garald Balding M.D.   On: 01/07/2016 04:32    My personal review of EKG: Rhythm NSR   Assessment & Plan:    Active Problems:   Paraplegia (HCC)   UTI (urinary tract infection)   Nausea & vomiting   1. Fever- likely from UTI versus viral gastroenteritis, patient empirically started on ceftriaxone. Follow urine culture results. Patient started on IV normal saline, Zofran when necessary for nausea and vomiting 2. Viral gastroenteritis- resolved, continue supportive treatment with IV fluids, Zofran when necessary. 3. Paraplegia- history of transverse myelitis, patient is chronically bedbound. Stable 4. Sacral ulcer- patient has large sacral ulcer, packing in place. Wound care consult. 5. Hypokalemia- replace potassium and check BMP in a.m.   DVT Prophylaxis-   Lovenox   AM Labs Ordered, also please review Full Orders  Family Communication: Admission, patients condition and plan of care including tests being ordered have been discussed with the patient and her mother at bedside who indicate understanding and agree with the plan and Code Status.  Code Status:  Full code  Admission status: Observation    Time spent in minutes : 60 minutes   Thanos Cousineau S M.D on 01/07/2016 at 9:00 AM  Between 7am to 7pm - Pager - [phone]. After 7pm go to [email or web address] - password [facility]  [facility] - Office  [phone]

## 2016-01-08 ENCOUNTER — Encounter (HOSPITAL_COMMUNITY): Payer: Self-pay | Admitting: Family Medicine

## 2016-01-08 DIAGNOSIS — G8220 Paraplegia, unspecified: Secondary | ICD-10-CM | POA: Diagnosis not present

## 2016-01-08 DIAGNOSIS — N390 Urinary tract infection, site not specified: Secondary | ICD-10-CM

## 2016-01-08 DIAGNOSIS — G43A0 Cyclical vomiting, not intractable: Secondary | ICD-10-CM

## 2016-01-08 DIAGNOSIS — R319 Hematuria, unspecified: Secondary | ICD-10-CM

## 2016-01-08 DIAGNOSIS — G373 Acute transverse myelitis in demyelinating disease of central nervous system: Secondary | ICD-10-CM

## 2016-01-08 DIAGNOSIS — L8990 Pressure ulcer of unspecified site, unspecified stage: Secondary | ICD-10-CM | POA: Diagnosis present

## 2016-01-08 LAB — COMPREHENSIVE METABOLIC PANEL
ALK PHOS: 72 U/L (ref 38–126)
ALT: 7 U/L — AB (ref 14–54)
ANION GAP: 8 (ref 5–15)
AST: 10 U/L — ABNORMAL LOW (ref 15–41)
Albumin: 2.7 g/dL — ABNORMAL LOW (ref 3.5–5.0)
BILIRUBIN TOTAL: 0.9 mg/dL (ref 0.3–1.2)
BUN: 5 mg/dL — ABNORMAL LOW (ref 6–20)
CALCIUM: 7.7 mg/dL — AB (ref 8.9–10.3)
CO2: 24 mmol/L (ref 22–32)
Chloride: 106 mmol/L (ref 101–111)
Glucose, Bld: 91 mg/dL (ref 65–99)
Potassium: 2.9 mmol/L — ABNORMAL LOW (ref 3.5–5.1)
SODIUM: 138 mmol/L (ref 135–145)
TOTAL PROTEIN: 5.1 g/dL — AB (ref 6.5–8.1)

## 2016-01-08 LAB — CBC
HCT: 29.9 % — ABNORMAL LOW (ref 36.0–46.0)
HEMOGLOBIN: 9.9 g/dL — AB (ref 12.0–15.0)
MCH: 26.5 pg (ref 26.0–34.0)
MCHC: 33.1 g/dL (ref 30.0–36.0)
MCV: 79.9 fL (ref 78.0–100.0)
Platelets: 191 10*3/uL (ref 150–400)
RBC: 3.74 MIL/uL — ABNORMAL LOW (ref 3.87–5.11)
RDW: 16.5 % — ABNORMAL HIGH (ref 11.5–15.5)
WBC: 5.7 10*3/uL (ref 4.0–10.5)

## 2016-01-08 LAB — URINE CULTURE

## 2016-01-08 MED ORDER — INFLUENZA VAC SPLIT QUAD 0.5 ML IM SUSY
0.5000 mL | PREFILLED_SYRINGE | INTRAMUSCULAR | Status: DC
  Filled 2016-01-08: qty 0.5

## 2016-01-08 MED ORDER — POTASSIUM CHLORIDE CRYS ER 20 MEQ PO TBCR
30.0000 meq | EXTENDED_RELEASE_TABLET | Freq: Two times a day (BID) | ORAL | Status: AC
  Administered 2016-01-08 (×2): 30 meq via ORAL
  Filled 2016-01-08 (×2): qty 1

## 2016-01-08 MED ORDER — ACETAMINOPHEN 325 MG PO TABS
650.0000 mg | ORAL_TABLET | Freq: Four times a day (QID) | ORAL | Status: DC | PRN
  Administered 2016-01-08 (×2): 650 mg via ORAL
  Filled 2016-01-08 (×2): qty 2

## 2016-01-08 MED ORDER — CEPHALEXIN 500 MG PO CAPS
500.0000 mg | ORAL_CAPSULE | Freq: Four times a day (QID) | ORAL | Status: DC
  Administered 2016-01-08 – 2016-01-09 (×4): 500 mg via ORAL
  Filled 2016-01-08 (×4): qty 1

## 2016-01-08 NOTE — Progress Notes (Signed)
PROGRESS NOTE  [name]  [id number] DOB: 09/20/1980 DOA: 01/07/2016 PCP: Simona Huh, MD  Outpatient Specialists: Neurology, Dr. Leta Baptist  Brief Narrative: [name] is a 35 y.o. female with a history of transverse myelitis, paraplegia, s/p urostomy with recurrent UTI brought by her mother for fever, and 2 days of nausea, vomiting, and diarrhea. She underwent debridement of sacral decubitus ulcer 2 PTA. Afterwards she started having diarrhea with vomiting. These symptoms began resolving when the patient feveloped a fever measured at 101.46F at home. She had no chest pain, dyspnea or abdominal pain but had noticed dark urine. In ED, UA was markedly abnormal, lactate 0.96 and she was admitted for treatment of UTI. She has remained afebrile throughout admission.  Assessment & Plan: Active Problems:   Paraplegia ([place])   UTI (urinary tract infection)   Nausea & vomiting   Pressure injury of skin  Urinary tract infection: Cause of fever and urinalysis suggestive of infection, a recurrent issue for her. Urostomy site not painful without drainage, but may still be colonized. Lactate wnl. No SIRS. - Urine culture suggested recollection: Will recollect urine specimen.  - Afebrile x24 hours, transition to po abx today  Viral gastroenteritis: Suspected as primary cause of N/V/D, since resolved. Still has nausea which is chronic for her.  - Taking po, D/C IVF - Zofran prn  Transverse myelitis: Chronic, stable, causing paraplegia and confinement to bed and wheelchair. Neuro is Dr. Leta Baptist.  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 Admission exam states no discharge or signs of infection. Also with abrasion of friable skin on superior/lateral right buttock thought to have occurred during transfer this admission.   - Wound care consult. - Following up with wound care as outpatient later this  week  Hypokalemia: Refractory to repletion.  - Replace po, recheck in AM.  DVT prophylaxis: Lovenox Code Status: Full Family Communication: Discussed with mother, Chrys Racer, in the room throughout encounter per pt's wishes. Disposition Plan: Continue treatment, transition to po abx, D/C if remains stable in 24 hours  Consultants:   Wound care RN  Procedures:   None  Antimicrobials:  Rocephin IV 10/7 - 10/8  Keflex 10/8 - 10/20 (14 days total)  Subjective: Pt still nauseated without emesis, ate breakfast without difficulty. Has had small soft BMs without diarrhea. No abd pain, fever, chills. Feels overall better, but states "I'm not quite ready to go."   Objective: Vitals:   01/07/16 1029 01/07/16 1445 01/07/16 2030 01/08/16 0450  BP: 113/86 118/76 104/66 (!) 101/59  Pulse: 85 78 99 99  Resp: 16 16 14 16   Temp: 98 F (36.7 C) 98.9 F (37.2 C) 98.8 F (37.1 C) 99.4 F (37.4 C)  TempSrc: Oral Oral Oral Oral  SpO2: 98% 99% 95% 99%  Weight:      Height:        Intake/Output Summary (Last 24 hours) at 01/08/16 1340 Last data filed at 01/08/16 1125  Gross per 24 hour  Intake             2470 ml  Output             1775 ml  Net              695 ml   Filed Weights   01/07/16 0301  Weight: 65.8 kg (145 lb)    Examination: General exam: 35 y.o. female in no distress Respiratory system: Non-labored breathing. Clear to  auscultation bilaterally.  Cardiovascular system: Regular rate and rhythm. No murmur, rub, or gallop. No JVD, and no pedal edema. Gastrointestinal system: Abdomen soft, non-tender, non-distended, with normoactive bowel sounds. No organomegaly or masses felt. Urostomy in RLQ nontender. Central nervous system: Alert and oriented. Paraplegic LE's and deconditioned upper extremities. Speech normal.  Extremities: Warm, no deformities Skin: Left buttock wound dressed without discharge. Dressing not removed. Right superior/lateral buttock shallow nondraining  abrasion ~ 2cm irregular ovoid.  Psychiatry: Judgement and insight appear normal. Mood & affect appropriate.   Data Reviewed: I have personally reviewed following labs and imaging studies  CBC:  Recent Labs Lab 01/07/16 0416 01/08/16 0424  WBC 9.6 5.7  NEUTROABS 7.7  --   HGB 13.1 9.9*  HCT 40.8 29.9*  MCV 82.1 79.9  PLT 294 [id number]   Basic Metabolic Panel:  Recent Labs Lab 01/07/16 0416 01/08/16 0424  NA 134* 138  K 3.3* 2.9*  CL 99* 106  CO2 26 24  GLUCOSE 100* 91  BUN 9 5*  CREATININE 0.42* <0.30*  CALCIUM 8.7* 7.7*   GFR: CrCl cannot be calculated (This lab value cannot be used to calculate CrCl because it is not a number: <0.30). Liver Function Tests:  Recent Labs Lab 01/07/16 0416 01/08/16 0424  AST 14* 10*  ALT 9* 7*  ALKPHOS 108 72  BILITOT 0.7 0.9  PROT 6.9 5.1*  ALBUMIN 3.7 2.7*   No results for input(s): LIPASE, AMYLASE in the last 168 hours. No results for input(s): AMMONIA in the last 168 hours. Coagulation Profile: No results for input(s): INR, PROTIME in the last 168 hours. Cardiac Enzymes: No results for input(s): CKTOTAL, CKMB, CKMBINDEX, TROPONINI in the last 168 hours. BNP (last 3 results) No results for input(s): PROBNP in the last 8760 hours. HbA1C: No results for input(s): HGBA1C in the last 72 hours. CBG: No results for input(s): GLUCAP in the last 168 hours. Lipid Profile: No results for input(s): CHOL, HDL, LDLCALC, TRIG, CHOLHDL, LDLDIRECT in the last 72 hours. Thyroid Function Tests: No results for input(s): TSH, T4TOTAL, FREET4, T3FREE, THYROIDAB in the last 72 hours. Anemia Panel: No results for input(s): VITAMINB12, FOLATE, FERRITIN, TIBC, IRON, RETICCTPCT in the last 72 hours. Urine analysis:    Component Value Date/Time   COLORURINE RED (A) 01/07/2016 0430   APPEARANCEUR TURBID (A) 01/07/2016 0430   LABSPEC 1.020 01/07/2016 0430   PHURINE 8.5 (H) 01/07/2016 0430   GLUCOSEU NEGATIVE 01/07/2016 0430   HGBUR NEGATIVE  01/07/2016 0430   BILIRUBINUR SMALL (A) 01/07/2016 0430   KETONESUR NEGATIVE 01/07/2016 0430   PROTEINUR 100 (A) 01/07/2016 0430   UROBILINOGEN 2.0 (H) 10/29/2014 1627   NITRITE POSITIVE (A) 01/07/2016 0430   LEUKOCYTESUR SMALL (A) 01/07/2016 0430   Sepsis Labs: @LABRCNTIP (procalcitonin:4,lacticidven:4)  ) Recent Results (from the past 240 hour(s))  Urine culture     Status: Abnormal   Collection Time: 01/07/16  4:29 AM  Result Value Ref Range Status   Specimen Description URINE, RANDOM  Final   Special Requests NONE  Final   Culture MULTIPLE SPECIES PRESENT, SUGGEST RECOLLECTION (A)  Final   Report Status 01/08/2016 FINAL  Final     Radiology Studies: Dg Chest 2 View  Result Date: 01/07/2016 CLINICAL DATA:  Acute onset of fever and nausea.  Initial encounter. EXAM: CHEST  2 VIEW COMPARISON:  Chest radiograph performed 07/31/2015 FINDINGS: The lungs are well-aerated and clear. There is no evidence of focal opacification, pleural effusion or pneumothorax. The heart is normal in  size; the mediastinal contour is within normal limits. No acute osseous abnormalities are seen. IMPRESSION: No acute cardiopulmonary process seen. Electronically Signed   By: Garald Balding M.D.   On: 01/07/2016 04:32    Scheduled Meds: . baclofen  20 mg Oral TID  . cefTRIAXone (ROCEPHIN)  IV  1 g Intravenous Q24H  . diazepam  10 mg Oral QHS  . diazepam  5 mg Oral BID  . enoxaparin (LOVENOX) injection  40 mg Subcutaneous Q24H  . gabapentin  800 mg Oral TID  . lamoTRIgine  125 mg Oral Daily  . lubiprostone  24 mcg Oral BID WC  . pantoprazole  40 mg Oral Daily  . potassium chloride  30 mEq Oral BID  . sertraline  100 mg Oral Daily   Continuous Infusions: . sodium chloride 100 mL/hr at 01/08/16 0749     LOS: 0 days   Time spent: 25 minutes.  Vance Gather, MD [facility] Pager [phone]  If 7PM-7AM, please contact night-coverage [email or web address] Password [email or web address] 01/08/2016, 1:40 PM

## 2016-01-09 DIAGNOSIS — N390 Urinary tract infection, site not specified: Secondary | ICD-10-CM | POA: Diagnosis not present

## 2016-01-09 DIAGNOSIS — G43A0 Cyclical vomiting, not intractable: Secondary | ICD-10-CM | POA: Diagnosis not present

## 2016-01-09 DIAGNOSIS — G373 Acute transverse myelitis in demyelinating disease of central nervous system: Secondary | ICD-10-CM | POA: Diagnosis not present

## 2016-01-09 DIAGNOSIS — G8220 Paraplegia, unspecified: Secondary | ICD-10-CM | POA: Diagnosis not present

## 2016-01-09 LAB — BASIC METABOLIC PANEL
ANION GAP: 7 (ref 5–15)
CALCIUM: 7.8 mg/dL — AB (ref 8.9–10.3)
CO2: 27 mmol/L (ref 22–32)
Chloride: 105 mmol/L (ref 101–111)
Creatinine, Ser: <0.3 mg/dL — ABNORMAL LOW (ref 0.44–1.00)
GLUCOSE: 87 mg/dL (ref 65–99)
Potassium: 3.3 mmol/L — ABNORMAL LOW (ref 3.5–5.1)
SODIUM: 139 mmol/L (ref 135–145)

## 2016-01-09 LAB — CBC
HCT: 30.5 % — ABNORMAL LOW (ref 36.0–46.0)
Hemoglobin: 9.9 g/dL — ABNORMAL LOW (ref 12.0–15.0)
MCH: 26.8 pg (ref 26.0–34.0)
MCHC: 32.5 g/dL (ref 30.0–36.0)
MCV: 82.4 fL (ref 78.0–100.0)
PLATELETS: 210 10*3/uL (ref 150–400)
RBC: 3.7 MIL/uL — AB (ref 3.87–5.11)
RDW: 16.9 % — ABNORMAL HIGH (ref 11.5–15.5)
WBC: 5.7 10*3/uL (ref 4.0–10.5)

## 2016-01-09 MED ORDER — CEPHALEXIN 500 MG PO CAPS: 500.0000 mg | ORAL_CAPSULE | Freq: Four times a day (QID) | ORAL | 0 refills | Status: AC

## 2016-01-09 NOTE — Consult Note (Signed)
[facility] Nurse wound consult note Reason for Consult: Left ischial Stage 4 pressure injury.  Present on admission.  Shearing injury to right trochanter, near buttocks.  Stated this was dry skin patch she has been treating.  Skin is dry to periwound.  Wound type:Pressure and shear Pressure Ulcer POA: Yes Measurement: Right posterior hip 2 nonintact lesions, 0.5 cm x 0.5 cm x 0.1 cm denuded abrasion.  LEft ischium: 6 cm x 4 cm x 5.2 cm  Wound [id number] red,  Friable tissue.  Recent debridement at [facility]. Drainage (amount, consistency, odor) Moderate serosanguinous  Pungent, musty odor.  Periwound:Dry skin Is on mattress with low air loss replacement for pressure redistribution Dressing procedure/placement/frequency:Cleanse wound to right hip with soap and water and pat gently dry.  Moisturize skin with barrier cream.  Silicone border foam dressing to nonintact lesions.   Cleanse left ischial wound with NS and pat gently dry.  Fill wound depth with Aquacel Ag, silver hydrofiber dressing.  Cover with 4x4 gauze and ABD pad and tape.  Change daily and PRN soilage.  Caregiver given extra supplies to use after discharge until follow up at [facility] Friday.  Will not follow at this time.  Please re-consult if needed.  Domenic Moras RN BSN [place] Pager [phone]

## 2016-01-09 NOTE — Care Management Note (Signed)
Case Management Note  Patient Details  Name: [name] MRN: [id number] Date of Birth: Nov 18, 1980  Subjective/Objective:    35 yo admitted with nausea and vomiting. Hx of paraplegia.                Action/Plan: From home with mother who provides her care. Pt also goes to the wound care center where they manage her wound. Pt and mother requesting air mattress for home. Pt mother states that Dr. Dellia Nims at the wound center has been working on getting them an air mattress, but she hasn't heard anything about the status of this. This CM contacted [facility] DME rep to see if air mattress was in the works for pt. To fast track mattress for pt, this CM requested order from MD. DME rep alerted of new order and will process for pt. Mother has handicap Lucianne Lei and all other equipment needed to care for pt. No other CM needs communicated.   Expected Discharge Date:                  Expected Discharge Plan:  Home/Self Care  In-House Referral:     Discharge planning Services  CM Consult  Post Acute Care Choice:    Choice offered to:     DME Arranged:  Specialty mattress (low loss air mattress.) DME Agency:  [place]:    [place] Agency:     Status of Service:  Completed, signed off  If discussed at [place] of Stay Meetings, dates discussed:    Additional CommentsLynnell Catalan, RN 01/09/2016, 10:16 AM [phone]

## 2016-01-09 NOTE — Discharge Summary (Signed)
Physician Discharge Summary  [name] [id number] DOB: 21-Nov-1983 DOA: 01/07/2016  PCP: Simona Huh, MD  Admit date: 01/07/2016 Discharge date: 01/09/2016  Admitted From: Home Disposition: Home   Recommendations for Outpatient Follow-up:  1. Follow up with PCP in 1-2 weeks 2. Will follow up with wound care center as scheduled on 10/13. Bone culture results are no growth to date from recent debridement. 3. Please follow up on the following pending results:  1. Repeat urine culture from 01/08/2016. Initial culture on 10/7 yielded multiple species.   Home Health: None Equipment/Devices: No new DME orders   Discharge Condition: Stable CODE STATUS: Full Diet recommendation: Regular  Brief/Interim Summary: RhiannonMyersis a 35 y.o.female with a history of transverse myelitis, paraplegia, s/p urostomy with recurrent UTI brought by her mother for fever, and 2 days of nausea, vomiting, and diarrhea. She underwent debridement of sacral decubitus ulcer 2 PTA. Afterwards she started having diarrhea with vomiting. These symptoms began resolving when the patient feveloped a fever measured at 101.90F at home. She had no chest pain, dyspnea or abdominal pain but had noticed dark urine. In ED, UA was markedly abnormal, lactate 0.96 and she was admitted for treatment of UTI. She has remained afebrile throughout admission, all symptoms resolved within 24 hours. Ceftriaxone was transitioned to keflex which she has tolerated well. Wound care RN has assessed the wound, still appearing uninfected. Bone culture data reviewed, NGTD. Will be discharged with wound care follow up to complete 14-day abx course for recurrent UTI, cultures still pending.   Discharge Diagnoses:  Active Problems:   Paraplegia ([place])   UTI (urinary tract infection)   Nausea & vomiting   Pressure injury of skin  Urinary tract infection: Cause of fever and urinalysis suggestive of infection, a recurrent issue for her.  Urostomy site not painful without drainage, but may still be colonized. Lactate wnl. No SIRS. - Urine culture suggested recollection: Specimen recollected, NGTD in 24hrs - Afebrile x24 hours, transition to po abx 10/8  Viral gastroenteritis: Suspected as primary cause of N/V/D, since resolved. Still has nausea which is chronic for her.  - Taking po, D/C IVF 10/8 - Zofran prn  Transverse myelitis: Chronic, stable, causing paraplegia and confinement to bed and wheelchair. Neuro is Dr. Leta Baptist.  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 Admission exam states no discharge or signs of infection. Also with abrasion of friable skin on superior/lateral right buttock thought to have occurred during transfer this admission.   - Wound care consult: See note from 10/9. Sent home with wound care supplies. - Following up with wound care as outpatient later this week  Hypokalemia: Mild, improved with repletion. Suspect secondary to GI losses now resolved.   Discharge Instructions Discharge Instructions    Discharge instructions    Complete by:  As directed    You were admitted with a fever, and GI symptoms which have resolved. Urine samples show evidence of infection as the cause to your fever and antibiotics have been given to treat this.  - Please continue taking antibiotic: keflex 500mg  4 times daily. To complete a 14 day course, please take this for 12 more days. This has been sent to your pharmacy. - Follow up at the wound care center on Friday - The bone culture has not shown any evidence of infection to date - If your symptoms return please seek medical care urgently       Medication List  STOP taking these medications   ciprofloxacin 500 MG tablet Commonly known as:  CIPRO     TAKE these medications   baclofen 20 MG tablet Commonly known as:  LIORESAL Take 20 mg by mouth 3 (three) times daily.    bisacodyl 10 MG suppository Commonly known as:  DULCOLAX Place 1 suppository (10 mg total) rectally daily as needed for moderate constipation.   cephALEXin 500 MG capsule Commonly known as:  KEFLEX Take 1 capsule (500 mg total) by mouth every 6 (six) hours.   diazepam 5 MG tablet Commonly known as:  VALIUM Take 1-2 tablets (5-10 mg total) by mouth every 8 (eight) hours. Take 1 tablet in AM, 1 tablet in PM, and 2 tablets at bedtime What changed:  when to take this  additional instructions   fluticasone 50 MCG/ACT nasal spray Commonly known as:  FLONASE Place 2 sprays into the nose daily as needed for allergies.   gabapentin 400 MG capsule Commonly known as:  NEURONTIN Take 800 mg by mouth 3 (three) times daily.   ibuprofen 200 MG tablet Commonly known as:  ADVIL,MOTRIN Take 400 mg by mouth every 6 (six) hours as needed for headache, mild pain or moderate pain.   lamoTRIgine 25 MG tablet Commonly known as:  LAMICTAL Take 125 mg by mouth daily.   loratadine 10 MG tablet Commonly known as:  CLARITIN Take 10 mg by mouth daily.   lubiprostone 24 MCG capsule Commonly known as:  AMITIZA Take 24 mcg by mouth 2 (two) times daily with a meal.   magnesium citrate Soln Take 148 mLs (0.5 Bottles total) by mouth as needed for severe constipation.   omeprazole 40 MG capsule Commonly known as:  PRILOSEC Take 40 mg by mouth daily.   senna-docusate 8.6-50 MG tablet Commonly known as:  Senokot-S Take 1 tablet by mouth 2 (two) times daily. What changed:  when to take this  reasons to take this   sertraline 100 MG tablet Commonly known as:  ZOLOFT Take 100 mg by mouth daily.       Allergies  Allergen Reactions  . Other Anaphylaxis    ALL -MYCINS  . Aspirin Other (See Comments)    Reaction unknown  . Iodine Other (See Comments)    Reaction unknown  . Latex Other (See Comments)    Reaction unknown  . Shellfish-Derived Products Other (See Comments)    Reaction  unknown  . Strawberry Extract Hives  . Vancomycin Other (See Comments)    Reaction unknown    Consultations:  Wound care: Domenic Moras, RN  Procedures/Studies: Dg Chest 2 View  Result Date: 01/07/2016 CLINICAL DATA:  Acute onset of fever and nausea.  Initial encounter. EXAM: CHEST  2 VIEW COMPARISON:  Chest radiograph performed 07/31/2015 FINDINGS: The lungs are well-aerated and clear. There is no evidence of focal opacification, pleural effusion or pneumothorax. The heart is normal in size; the mediastinal contour is within normal limits. No acute osseous abnormalities are seen. IMPRESSION: No acute cardiopulmonary process seen. Electronically Signed   By: Garald Balding M.D.   On: 01/07/2016 04:32     Subjective: Pt feels significantly better. No nausea, vomiting or diarrhea. Urine output improved, lighter. Eating well.   Discharge Exam: Vitals:   01/08/16 2200 01/09/16 0600  BP: 114/70 103/69  Pulse: 86 88  Resp: 16 16  Temp: 99.9 F (37.7 C) 97.5 F (36.4 C)   Vitals:   01/08/16 0450 01/08/16 1500 01/08/16 2200 01/09/16 0600  BP: Marland Kitchen)  101/59 111/67 114/70 103/69  Pulse: 99 81 86 88  Resp: 16 16 16 16   Temp: 99.4 F (37.4 C) 98.6 F (37 C) 99.9 F (37.7 C) 97.5 F (36.4 C)  TempSrc: Oral Oral Oral Oral  SpO2: 99% 99% 97% 97%  Weight:      Height:       General: Pt is alert, awake, not in acute distress CV: RRR, S1/S2 +, no rubs, no gallops Pulm: CTA bilaterally, no wheezing, no rhonchi GI: Abdomen soft, non-tender, non-distended, with normoactive bowel sounds. No organomegaly or masses felt. Urostomy in RLQ nontender. Neuro: Alert and oriented. Paraplegic LE's and deconditioned upper extremities. Speech normal.  Skin: Left buttock wound dressed without discharge. Dressing not removed. Right superior/lateral buttock shallow nondraining abrasion ~ 2cm irregular ovoid.   The results of significant diagnostics from this hospitalization (including imaging,  microbiology, ancillary and laboratory) are listed below for reference.    Microbiology: Recent Results (from the past 240 hour(s))  Culture, blood (Routine x 2)     Status: None (Preliminary result)   Collection Time: 01/07/16  4:07 AM  Result Value Ref Range Status   Specimen Description BLOOD BLOOD RIGHT FOREARM  Final   Special Requests IN PEDIATRIC BOTTLE [place]  Final   Culture   Final    NO GROWTH 1 DAY Performed at [facility]    Report Status PENDING  Incomplete  Culture, blood (Routine x 2)     Status: None (Preliminary result)   Collection Time: 01/07/16  4:13 AM  Result Value Ref Range Status   Specimen Description BLOOD RIGHT HAND  Final   Special Requests BOTTLES DRAWN AEROBIC AND ANAEROBIC 5CC EACH  Final   Culture   Final    NO GROWTH 1 DAY Performed at [facility]    Report Status PENDING  Incomplete  Urine culture     Status: Abnormal   Collection Time: 01/07/16  4:29 AM  Result Value Ref Range Status   Specimen Description URINE, RANDOM  Final   Special Requests NONE  Final   Culture MULTIPLE SPECIES PRESENT, SUGGEST RECOLLECTION (A)  Final   Report Status 01/08/2016 FINAL  Final     Labs: BNP (last 3 results) No results for input(s): BNP in the last 8760 hours. Basic Metabolic Panel:  Recent Labs Lab 01/07/16 0416 01/08/16 0424 01/09/16 0327  NA 134* 138 139  K 3.3* 2.9* 3.3*  CL 99* 106 105  CO2 26 24 27   GLUCOSE 100* 91 87  BUN 9 5* <5*  CREATININE 0.42* <0.30* <0.30*  CALCIUM 8.7* 7.7* 7.8*   Liver Function Tests:  Recent Labs Lab 01/07/16 0416 01/08/16 0424  AST 14* 10*  ALT 9* 7*  ALKPHOS 108 72  BILITOT 0.7 0.9  PROT 6.9 5.1*  ALBUMIN 3.7 2.7*   No results for input(s): LIPASE, AMYLASE in the last 168 hours. No results for input(s): AMMONIA in the last 168 hours. CBC:  Recent Labs Lab 01/07/16 0416 01/08/16 0424 01/09/16 0327  WBC 9.6 5.7 5.7  NEUTROABS 7.7  --   --   HGB 13.1 9.9* 9.9*  HCT 40.8 29.9*  30.5*  MCV 82.1 79.9 82.4  PLT 294 191 210   Cardiac Enzymes: No results for input(s): CKTOTAL, CKMB, CKMBINDEX, TROPONINI in the last 168 hours. BNP: Invalid input(s): POCBNP CBG: No results for input(s): GLUCAP in the last 168 hours. D-Dimer No results for input(s): DDIMER in the last 72 hours. Hgb A1c No results  for input(s): HGBA1C in the last 72 hours. Lipid Profile No results for input(s): CHOL, HDL, LDLCALC, TRIG, CHOLHDL, LDLDIRECT in the last 72 hours. Thyroid function studies No results for input(s): TSH, T4TOTAL, T3FREE, THYROIDAB in the last 72 hours.  Invalid input(s): FREET3 Anemia work up No results for input(s): VITAMINB12, FOLATE, FERRITIN, TIBC, IRON, RETICCTPCT in the last 72 hours. Urinalysis    Component Value Date/Time   COLORURINE RED (A) 01/07/2016 0430   APPEARANCEUR TURBID (A) 01/07/2016 0430   LABSPEC 1.020 01/07/2016 0430   PHURINE 8.5 (H) 01/07/2016 0430   GLUCOSEU NEGATIVE 01/07/2016 0430   HGBUR NEGATIVE 01/07/2016 0430   BILIRUBINUR SMALL (A) 01/07/2016 0430   KETONESUR NEGATIVE 01/07/2016 0430   PROTEINUR 100 (A) 01/07/2016 0430   UROBILINOGEN 2.0 (H) 10/29/2014 1627   NITRITE POSITIVE (A) 01/07/2016 0430   LEUKOCYTESUR SMALL (A) 01/07/2016 0430   Sepsis Labs Invalid input(s): PROCALCITONIN,  WBC,  LACTICIDVEN Microbiology Recent Results (from the past 240 hour(s))  Culture, blood (Routine x 2)     Status: None (Preliminary result)   Collection Time: 01/07/16  4:07 AM  Result Value Ref Range Status   Specimen Description BLOOD BLOOD RIGHT FOREARM  Final   Special Requests IN PEDIATRIC BOTTLE [place]  Final   Culture   Final    NO GROWTH 1 DAY Performed at [facility]    Report Status PENDING  Incomplete  Culture, blood (Routine x 2)     Status: None (Preliminary result)   Collection Time: 01/07/16  4:13 AM  Result Value Ref Range Status   Specimen Description BLOOD RIGHT HAND  Final   Special Requests BOTTLES DRAWN AEROBIC  AND ANAEROBIC 5CC EACH  Final   Culture   Final    NO GROWTH 1 DAY Performed at [facility]    Report Status PENDING  Incomplete  Urine culture     Status: Abnormal   Collection Time: 01/07/16  4:29 AM  Result Value Ref Range Status   Specimen Description URINE, RANDOM  Final   Special Requests NONE  Final   Culture MULTIPLE SPECIES PRESENT, SUGGEST RECOLLECTION (A)  Final   Report Status 01/08/2016 FINAL  Final    Time coordinating discharge: Over 68 minutes  Vance Gather, MD  [facility] 01/09/2016, 9:26 AM Pager [phone]  If 7PM-7AM, please contact night-coverage [email or web address] Password [email or web address]

## 2016-01-10 LAB — URINE CULTURE

## 2016-01-12 LAB — CULTURE, BLOOD (ROUTINE X 2)
CULTURE: NO GROWTH
CULTURE: NO GROWTH

## 2016-01-13 ENCOUNTER — Encounter: Payer: Self-pay | Admitting: Neurology

## 2016-01-13 ENCOUNTER — Ambulatory Visit (INDEPENDENT_AMBULATORY_CARE_PROVIDER_SITE_OTHER): Payer: Medicaid Other | Admitting: Neurology

## 2016-01-13 VITALS — BP 112/70 | HR 90 | Ht 63.0 in | Wt 145.0 lb

## 2016-01-13 DIAGNOSIS — N319 Neuromuscular dysfunction of bladder, unspecified: Secondary | ICD-10-CM

## 2016-01-13 DIAGNOSIS — G373 Acute transverse myelitis in demyelinating disease of central nervous system: Secondary | ICD-10-CM | POA: Diagnosis not present

## 2016-01-13 DIAGNOSIS — L89324 Pressure ulcer of left buttock, stage 4: Secondary | ICD-10-CM | POA: Diagnosis not present

## 2016-01-13 MED ORDER — GABAPENTIN 300 MG PO CAPS: 600.0000 mg | ORAL_CAPSULE | Freq: Three times a day (TID) | ORAL | 5 refills | Status: DC

## 2016-01-13 NOTE — Progress Notes (Signed)
Chart forwarded.  

## 2016-01-13 NOTE — Progress Notes (Signed)
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  History obtained by patient, PCP note and prior neurologist's notes  In 2005, she developed acute transverse myelitis.  She woke up with severe neck and back pain with weakness in the arms and legs, as well as bowel and bladder retention.  MRI of cervical spine showed abnormal signal abnormality and cord swelling from C4 through C7 with abnormal enhancement at the C4-5 level.  MRI of thoracic spine showed upper thoracic cord involvement at TI and T2.  She underwent lumbar puncture and was treated with 3 days of IV Solu-Medrol, followed by oral steroids.    She is paraplegic, unable to move below the waist.  She has both bowel and urinary retention.  She has right greater than left upper extremity weakness.  She has a urostomy pouch and requires catheterizations.  She uses stool softeners for constipation.  She takes Diazepam and Baclofen for muscle spasms, as well as gabapentin.  She has chronic sacral decubitus ulcer for which she is followed by wound care.  She reports daytime somnolence.  She sees psychiatry for depression and PTSD.  A follow up MRI of the neuroaxis with and without contrast was performed on 03/24/15, which was personally reviewed.  The brain showed slight cerebral volume loss but no demyelinating lesions.  The cervical and thoracic spinal cord showed atrophy extending from C6 to T2, greatest at C7-T1.  There is a left central disc extrusion at C5-6 resulting in cord flattening with new T2 signal at this level but no abnormal enhancement.  PAST MEDICAL HISTORY: Past Medical History:  Diagnosis Date  . Bladder stones    Hx of  . Depression   . GERD (gastroesophageal reflux disease)   . Multiple sclerosis ([place])   . Paraplegia ([place])   . PTSD (post-traumatic stress disorder)   . Transverse myelitis ([place])     PAST SURGICAL HISTORY: Past Surgical History:  Procedure Laterality Date  . CHOLECYSTECTOMY    . CYSTECTOMY W/ URETEROILEAL CONDUIT    . TONSILLECTOMY    . WISDOM TOOTH EXTRACTION      MEDICATIONS: Current Outpatient Prescriptions on File Prior to Visit  Medication Sig Dispense Refill  . baclofen (LIORESAL) 20 MG tablet Take 20 mg by mouth 3 (three) times daily.    . bisacodyl (DULCOLAX) 10 MG suppository Place 1 suppository (10 mg total) rectally daily as needed for moderate constipation. 12 suppository 0  . cephALEXin (KEFLEX) 500 MG capsule Take 1 capsule (500 mg total) by mouth every 6 (six) hours. 48 capsule 0  . diazepam (VALIUM) 5 MG tablet Take 1-2 tablets (5-10 mg total) by mouth every 8 (eight) hours. Take 1 tablet in AM, 1 tablet in PM, and 2 tablets at bedtime (Patient taking differently: Take 5-10 mg by mouth 3 (three) times daily. Take 1 tablet in AM, 1 tablet in PM, and 2 tablets at bedtime) 30 tablet 0  . fluticasone (FLONASE) 50 MCG/ACT nasal spray Place 2 sprays into the nose daily as needed for allergies.     Marland Kitchen ibuprofen (ADVIL,MOTRIN) 200 MG tablet Take 400 mg by mouth every 6 (six) hours as needed for headache, mild pain or moderate pain.     Marland Kitchen lamoTRIgine (  LAMICTAL) 25 MG tablet Take 125 mg by mouth daily.    Marland Kitchen loratadine (CLARITIN) 10 MG tablet Take 10 mg by mouth daily.    Marland Kitchen lubiprostone (AMITIZA) 24 MCG capsule Take 24 mcg by mouth 2 (two) times daily with a meal.    . omeprazole (PRILOSEC) 40 MG capsule Take 40 mg by mouth daily.    Marland Kitchen senna-docusate (SENOKOT-S) 8.6-50 MG per tablet Take 1 tablet by mouth 2 (two) times daily. (Patient taking differently: Take 1 tablet by mouth 2 (two) times daily as needed for mild constipation or moderate constipation. ) 30 tablet 0  .  sertraline (ZOLOFT) 100 MG tablet Take 100 mg by mouth daily.     No current facility-administered medications on file prior to visit.     ALLERGIES: Allergies  Allergen Reactions  . Other Anaphylaxis    ALL -MYCINS  . Aspirin Other (See Comments)    Reaction unknown  . Iodine Other (See Comments)    Reaction unknown  . Latex Other (See Comments)    Reaction unknown  . Shellfish-Derived Products Other (See Comments)    Reaction unknown  . Strawberry Extract Hives  . Vancomycin Other (See Comments)    Reaction unknown    FAMILY HISTORY: Family History  Problem Relation Age of Onset  . Diabetes type II Father   . CAD Father   . Dementia Father   . Stroke Father   . Seizures Father   . Diabetes type II Mother   . Hypertension Mother   . Breast cancer Other   . Diabetes type II Other     SOCIAL HISTORY: Social History   Social History  . Marital status: Single    Spouse name: N/A  . Number of children: N/A  . Years of education: N/A   Occupational History  . Not on file.   Social History Main Topics  . Smoking status: Never Smoker  . Smokeless tobacco: Never Used  . Alcohol use No  . Drug use: No  . Sexual activity: No   Other Topics Concern  . Not on file   Social History Narrative   Lives with parents.  All disabled.  Attended [employer] until she got transverse myelitis.    REVIEW OF SYSTEMS: Constitutional: No fevers, chills, or sweats, no generalized fatigue, change in appetite Eyes: No visual changes, double vision, eye pain Ear, nose and throat: No hearing loss, ear pain, nasal congestion, sore throat Cardiovascular: No chest pain, palpitations Respiratory:  No shortness of breath at rest or with exertion, wheezes GastrointestinaI: as above Genitourinary:  As above Musculoskeletal:  Leg cramps Integumentary: No rash, pruritus, skin lesions Neurological: as above Psychiatric: depression Endocrine: No palpitations, fatigue, diaphoresis, mood  swings, change in appetite, change in weight, increased thirst Hematologic/Lymphatic:  No purpura, petechiae. Allergic/Immunologic: no itchy/runny eyes, nasal congestion, recent allergic reactions, rashes  PHYSICAL EXAM: Vitals:   01/13/16 1259  BP: 112/70  Pulse: 90   General: No acute distress.  Head:  Normocephalic/atraumatic Eyes:  fundi examined but not visualized Neck: supple, no paraspinal tenderness, full range of motion Back: No paraspinal tenderness Heart: regular rate and rhythm Lungs: Clear to auscultation bilaterally. Vascular: No carotid bruits. Neurological Exam: Mental status: alert and oriented to person, place, and time, recent and remote memory intact, fund of knowledge intact, attention and concentration intact, speech fluent and not dysarthric, language intact. Cranial nerves: CN I: not tested CN II: pupils equal, round and reactive to light, visual fields intact CN  III, IV, VI:  full range of motion, no nystagmus, no ptosis CN V: facial sensation intact CN VII: upper and lower face symmetric CN VIII: hearing intact CN IX, X: gag intact, uvula midline CN XI: sternocleidomastoid and trapezius muscles intact CN XII: tongue midline Bulk & Tone:  Decreased muscle bulk in left upper extremity and both lower extremities.  Increased tone in left upper and both lower extremities. Motor:  5/5 right deltoid, both biceps, both triceps, both wrist and finger flexors and extensors.  4/5 left deltoid, left hand grip.  0/5 lower extremities. Sensation:  Decreased pinprick in lower extremities up to waist-line Deep Tendon Reflexes:  3+ throughout, slightly more brisk on left.  Toes downgoing. Finger to nose testing:  Without dysmetria.  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 Neurogenic bladder  PLAN: Supportive care. 1.  On long-term diazepam.  Decreased dose from 10mg   three times daily to 5mg  in AM, 5 mg in afternoon and 10mg  at bedtime to decrease daytime somnolence.  It is still an issue.  She has described withdrawal symptoms in the past when she has missed a dose.  Any attempt to taper off would require very gradual taper, which I do not wish to pursue at this time. 2.  Will try decreasing dose of gabapentin from 800mg  three times daily to 600mg  three times daily to see if drowsiness improves.  If it does not work out, we can always increase dose again 3.  Continue baclofen 20mg  three times daily 4.  Continue care for bladder and decubitus ulcer 5.  Follow up in 7 months.  Thank you for allowing me to take part in the care of this patient.  Metta Clines, DO  CC:  Gaynelle Arabian, MD  Lennie Odor, PA-C

## 2016-01-13 NOTE — Patient Instructions (Addendum)
1.  We will try decreasing your gabapentin to see if it helps decrease your drowsiness.  We will decrease dose to 600mg  three times daily.  Take two 300 capsules three times daily 2.  We won't make changes to other medications at this time. 3.  Follow up in 7 months.

## 2016-01-19 ENCOUNTER — Telehealth: Payer: Self-pay | Admitting: Neurology

## 2016-01-19 NOTE — Telephone Encounter (Signed)
[name] 1980-06-05. She was wanting to know if she could get the Flu shot. In the past she has not due to her Transverse Myelitis and she has ended up in the hospital with the Flu. She is wondering if this Flu Shot strand is more dangerous or would it be ok to have. Her # is [id number]. Thank you

## 2016-01-20 NOTE — Telephone Encounter (Signed)
I believe that it should be okay to get the flu shot.  However, I need to look up the latest guidelines.

## 2016-01-20 NOTE — Telephone Encounter (Signed)
Please see message from front desk staff. Pt is interested in flu vaccine.

## 2016-01-27 DIAGNOSIS — L89324 Pressure ulcer of left buttock, stage 4: Secondary | ICD-10-CM | POA: Diagnosis not present

## 2016-01-29 ENCOUNTER — Emergency Department (HOSPITAL_COMMUNITY)
Admission: EM | Admit: 2016-01-29 | Discharge: 2016-01-29 | Disposition: A | Discharge status: Home / Self Care | Payer: Medicaid Other | Attending: Emergency Medicine | Admitting: Emergency Medicine

## 2016-01-29 ENCOUNTER — Emergency Department (HOSPITAL_COMMUNITY): Payer: Medicaid Other

## 2016-01-29 ENCOUNTER — Encounter (HOSPITAL_COMMUNITY): Payer: Self-pay

## 2016-01-29 DIAGNOSIS — Z79899 Other long term (current) drug therapy: Secondary | ICD-10-CM | POA: Diagnosis not present

## 2016-01-29 DIAGNOSIS — R69 Illness, unspecified: Secondary | ICD-10-CM

## 2016-01-29 DIAGNOSIS — R509 Fever, unspecified: Secondary | ICD-10-CM | POA: Diagnosis present

## 2016-01-29 DIAGNOSIS — J111 Influenza due to unidentified influenza virus with other respiratory manifestations: Secondary | ICD-10-CM | POA: Diagnosis not present

## 2016-01-29 DIAGNOSIS — Z91040 Latex allergy status: Secondary | ICD-10-CM | POA: Insufficient documentation

## 2016-01-29 LAB — COMPREHENSIVE METABOLIC PANEL
ALT: 12 U/L — AB (ref 14–54)
ANION GAP: 8 (ref 5–15)
AST: 13 U/L — ABNORMAL LOW (ref 15–41)
Albumin: 3.7 g/dL (ref 3.5–5.0)
Alkaline Phosphatase: 120 U/L (ref 38–126)
BUN: 10 mg/dL (ref 6–20)
CHLORIDE: 101 mmol/L (ref 101–111)
CO2: 27 mmol/L (ref 22–32)
CREATININE: 0.44 mg/dL (ref 0.44–1.00)
Calcium: 8.7 mg/dL — ABNORMAL LOW (ref 8.9–10.3)
Glucose, Bld: 97 mg/dL (ref 65–99)
POTASSIUM: 3.9 mmol/L (ref 3.5–5.1)
SODIUM: 136 mmol/L (ref 135–145)
Total Bilirubin: 0.9 mg/dL (ref 0.3–1.2)
Total Protein: 6.9 g/dL (ref 6.5–8.1)

## 2016-01-29 LAB — CBC WITH DIFFERENTIAL/PLATELET
Basophils Absolute: 0 10*3/uL (ref 0.0–0.1)
Basophils Relative: 0 %
EOS ABS: 0.1 10*3/uL (ref 0.0–0.7)
EOS PCT: 1 %
HCT: 37.9 % (ref 36.0–46.0)
Hemoglobin: 12.1 g/dL (ref 12.0–15.0)
LYMPHS ABS: 0.6 10*3/uL — AB (ref 0.7–4.0)
Lymphocytes Relative: 5 %
MCH: 26.1 pg (ref 26.0–34.0)
MCHC: 31.9 g/dL (ref 30.0–36.0)
MCV: 81.7 fL (ref 78.0–100.0)
MONO ABS: 0.4 10*3/uL (ref 0.1–1.0)
Monocytes Relative: 3 %
Neutro Abs: 10.4 10*3/uL — ABNORMAL HIGH (ref 1.7–7.7)
Neutrophils Relative %: 91 %
PLATELETS: 237 10*3/uL (ref 150–400)
RBC: 4.64 MIL/uL (ref 3.87–5.11)
RDW: 17.2 % — AB (ref 11.5–15.5)
WBC: 11.5 10*3/uL — AB (ref 4.0–10.5)

## 2016-01-29 LAB — URINE MICROSCOPIC-ADD ON

## 2016-01-29 LAB — URINALYSIS, ROUTINE W REFLEX MICROSCOPIC
Bilirubin Urine: NEGATIVE
Glucose, UA: NEGATIVE mg/dL
Hgb urine dipstick: NEGATIVE
Ketones, ur: NEGATIVE mg/dL
NITRITE: NEGATIVE
PH: 6.5 (ref 5.0–8.0)
Protein, ur: NEGATIVE mg/dL
SPECIFIC GRAVITY, URINE: 1.017 (ref 1.005–1.030)

## 2016-01-29 MED ORDER — SODIUM CHLORIDE 0.9 % IV BOLUS (SEPSIS)
1000.0000 mL | Freq: Once | INTRAVENOUS | Status: AC
  Administered 2016-01-29: 1000 mL via INTRAVENOUS

## 2016-01-29 MED ORDER — BENZONATATE 100 MG PO CAPS: 100.0000 mg | ORAL_CAPSULE | Freq: Three times a day (TID) | ORAL | 0 refills | Status: DC

## 2016-01-29 MED ORDER — AMOXICILLIN 500 MG PO CAPS
500.0000 mg | ORAL_CAPSULE | Freq: Once | ORAL | Status: AC
  Administered 2016-01-29: 500 mg via ORAL
  Filled 2016-01-29: qty 1

## 2016-01-29 MED ORDER — ONDANSETRON 4 MG PO TBDP: ORAL_TABLET | ORAL | 0 refills | Status: DC

## 2016-01-29 MED ORDER — AMOXICILLIN 500 MG PO CAPS: 500.0000 mg | ORAL_CAPSULE | Freq: Three times a day (TID) | ORAL | 0 refills | Status: DC

## 2016-01-29 MED ORDER — ACETAMINOPHEN 325 MG PO TABS
650.0000 mg | ORAL_TABLET | Freq: Once | ORAL | Status: AC
  Administered 2016-01-29: 650 mg via ORAL
  Filled 2016-01-29: qty 2

## 2016-01-29 NOTE — ED Provider Notes (Signed)
[facility] DEPT Provider Note   CSN: [id number] Arrival date & time: 01/29/16  1305     History   Chief Complaint Chief Complaint  Patient presents with  . Fever  . Weakness    HPI [name] is a 35 y.o. female.  35 yo F with a chief complaint of cough congestion vomiting diarrhea. Going on for the past couple days. This happened right after she got her flu shot. Denies any abdominal pain denies shortness of breath. Has had some prolonged coughing fits today. Febrile up to 101 at home. Has been taking cough medicine with some improvement.   The history is provided by the patient.  Fever   This is a new problem. The current episode started more than 2 days ago. The problem occurs constantly. The problem has not changed since onset.The maximum temperature noted was 101 to 101.9 F. Pertinent negatives include no chest pain, no vomiting, no congestion and no headaches. She has tried nothing for the symptoms. The treatment provided no relief.  Weakness  Primary symptoms include no dizziness. Pertinent negatives include no shortness of breath, no chest pain, no vomiting and no headaches.    Past Medical History:  Diagnosis Date  . Bladder stones    Hx of  . Depression   . GERD (gastroesophageal reflux disease)   . Multiple sclerosis ([place])   . Paraplegia ([place])   . PTSD (post-traumatic stress disorder)   . Transverse myelitis [facility])     Patient Active Problem List   Diagnosis Date Noted  . Pressure injury of skin 01/08/2016  . Nausea & vomiting 01/07/2016  . Ileus ([place]) 10/29/2014  . Constipation 10/29/2014  . Obstipation 09/27/2014  . UTI (urinary tract infection) 03/31/2013  . Influenza 03/30/2013  . Nausea and vomiting 03/30/2013  . Hypokalemia 02/16/2012  . Multiple sclerosis ([place]) 10/16/2011  . Paraplegia [facility])     Past Surgical History:  Procedure Laterality Date  . CHOLECYSTECTOMY    . CYSTECTOMY W/ URETEROILEAL CONDUIT    . TONSILLECTOMY    .  WISDOM TOOTH EXTRACTION      OB History    No data available       Home Medications    Prior to Admission medications   Medication Sig Start Date End Date Taking? Authorizing Provider  acetaminophen (TYLENOL) 325 MG tablet Take 650 mg by mouth every 6 (six) hours as needed for fever.   Yes Historical Provider, MD  baclofen (LIORESAL) 20 MG tablet Take 20 mg by mouth 3 (three) times daily.   Yes Historical Provider, MD  bisacodyl (DULCOLAX) 10 MG suppository Place 1 suppository (10 mg total) rectally daily as needed for moderate constipation. 10/31/14  Yes Nishant Dhungel, MD  diazepam (VALIUM) 5 MG tablet Take 1-2 tablets (5-10 mg total) by mouth every 8 (eight) hours. Take 1 tablet in AM, 1 tablet in PM, and 2 tablets at bedtime Patient taking differently: Take 5-10 mg by mouth 3 (three) times daily. Take 1 tablet in AM, 1 tablet in PM, and 2 tablets at bedtime 04/02/13  Yes Robbie Lis, MD  fluticasone [facility]) 50 MCG/ACT nasal spray Place 2 sprays into the nose daily as needed for allergies.    Yes Historical Provider, MD  gabapentin (NEURONTIN) 300 MG capsule Take 2 capsules (600 mg total) by mouth 3 (three) times daily. 01/13/16  Yes Adam Telford Nab, DO  ibuprofen (ADVIL,MOTRIN) 200 MG tablet Take 400 mg by mouth every 6 (six) hours as needed for  headache, mild pain or moderate pain.    Yes Historical Provider, MD  lamoTRIgine (LAMICTAL) 25 MG tablet Take 125 mg by mouth daily.   Yes Historical Provider, MD  loratadine (CLARITIN) 5 MG/5ML syrup Take 5 mg by mouth daily.   Yes Historical Provider, MD  lubiprostone (AMITIZA) 24 MCG capsule Take 24 mcg by mouth 2 (two) times daily with a meal.   Yes Historical Provider, MD  naproxen sodium (ANAPROX) 220 MG tablet Take 220-440 mg by mouth 3 (three) times daily as needed (migraine/headache).   Yes Historical Provider, MD  omeprazole (PRILOSEC) 40 MG capsule Take 40 mg by mouth daily.   Yes Historical Provider, MD  senna-docusate (SENOKOT-S)  8.6-50 MG per tablet Take 1 tablet by mouth 2 (two) times daily. Patient taking differently: Take 1 tablet by mouth 2 (two) times daily as needed for mild constipation or moderate constipation.  10/31/14  Yes Nishant Dhungel, MD  sertraline (ZOLOFT) 100 MG tablet Take 100 mg by mouth daily.   Yes Historical Provider, MD  amoxicillin (AMOXIL) 500 MG capsule Take 1 capsule (500 mg total) by mouth 3 (three) times daily. 01/29/16   Quintella Reichert, MD  benzonatate (TESSALON) 100 MG capsule Take 1 capsule (100 mg total) by mouth every 8 (eight) hours. 01/29/16   Deno Etienne, DO  cephALEXin (KEFLEX) 500 MG capsule Take 500 mg by mouth 4 (four) times daily.    Historical Provider, MD  ondansetron (ZOFRAN ODT) 4 MG disintegrating tablet 4mg  ODT q4 hours prn nausea/vomit 01/29/16   Deno Etienne, DO    Family History Family History  Problem Relation Age of Onset  . Diabetes type II Father   . CAD Father   . Dementia Father   . Stroke Father   . Seizures Father   . Diabetes type II Mother   . Hypertension Mother   . Breast cancer Other   . Diabetes type II Other     Social History Social History  Substance Use Topics  . Smoking status: Never Smoker  . Smokeless tobacco: Never Used  . Alcohol use No     Allergies   Iodine; Latex; Other; Shellfish-derived products; Vancomycin; Aspirin; and Strawberry extract   Review of Systems Review of Systems  Constitutional: Positive for fever. Negative for chills.  HENT: Negative for congestion and rhinorrhea.   Eyes: Negative for redness and visual disturbance.  Respiratory: Negative for shortness of breath and wheezing.   Cardiovascular: Negative for chest pain and palpitations.  Gastrointestinal: Negative for nausea and vomiting.  Genitourinary: Negative for dysuria and urgency.  Musculoskeletal: Negative for arthralgias and myalgias.  Skin: Negative for pallor and wound.  Neurological: Positive for weakness. Negative for dizziness and headaches.       Physical Exam Updated Vital Signs BP 118/73 (BP Location: Right Arm)   Pulse 105   Temp 99.8 F (37.7 C) (Oral)   Resp 18   Ht 5\' 3"  (1.6 m)   Wt 145 lb (65.8 kg)   LMP 01/15/2016   SpO2 99%   BMI 25.69 kg/m   Physical Exam  Constitutional: She is oriented to person, place, and time. She appears well-developed and well-nourished. No distress.  filthy  HENT:  Head: Normocephalic and atraumatic.  Eyes: EOM are normal. Pupils are equal, round, and reactive to light.  Neck: Normal range of motion. Neck supple.  Cardiovascular: Regular rhythm.  Tachycardia present.  Exam reveals no gallop and no friction rub.   No murmur heard. Pulmonary/Chest: Effort normal.  She has no wheezes. She has no rales.  Abdominal: Soft. She exhibits no distension and no mass. There is no tenderness. There is no guarding.  Musculoskeletal: She exhibits no edema or tenderness.  Neurological: She is alert and oriented to person, place, and time.  Skin: Skin is warm and dry. She is not diaphoretic.  Psychiatric: She has a normal mood and affect. Her behavior is normal.  Nursing note and vitals reviewed.    ED Treatments / Results  Labs (all labs ordered are listed, but only abnormal results are displayed) Labs Reviewed  CBC WITH DIFFERENTIAL/PLATELET - Abnormal; Notable for the following:       Result Value   WBC 11.5 (*)    RDW 17.2 (*)    Neutro Abs 10.4 (*)    Lymphs Abs 0.6 (*)    All other components within normal limits  COMPREHENSIVE METABOLIC PANEL - Abnormal; Notable for the following:    Calcium 8.7 (*)    AST 13 (*)    ALT 12 (*)    All other components within normal limits  URINALYSIS, ROUTINE W REFLEX MICROSCOPIC (NOT AT [facility]) - Abnormal; Notable for the following:    APPearance TURBID (*)    Leukocytes, UA MODERATE (*)    All other components within normal limits  URINE MICROSCOPIC-ADD ON - Abnormal; Notable for the following:    Squamous Epithelial / LPF 0-5 (*)     Bacteria, UA MANY (*)    All other components within normal limits  URINE CULTURE    EKG  EKG Interpretation None       Radiology Dg Chest 2 View  Result Date: 01/29/2016 CLINICAL DATA:  Fever, vomiting. EXAM: CHEST  2 VIEW COMPARISON:  Radiographs of January 07, 2016. FINDINGS: The heart size and mediastinal contours are within normal limits. Both lungs are clear. No pneumothorax or pleural effusion is noted. The visualized skeletal structures are unremarkable. IMPRESSION: No active cardiopulmonary disease. Electronically Signed   By: Marijo Conception, M.D.   On: 01/29/2016 14:21    Procedures Procedures (including critical care time)  Medications Ordered in ED Medications  sodium chloride 0.9 % bolus 1,000 mL (0 mLs Intravenous Stopped 01/29/16 1741)  acetaminophen (TYLENOL) tablet 650 mg (650 mg Oral Given 01/29/16 1715)  amoxicillin (AMOXIL) capsule 500 mg (500 mg Oral Given 01/29/16 1739)     Initial Impression / Assessment and Plan / ED Course  I have reviewed the triage vital signs and the nursing notes.  Pertinent labs & imaging results that were available during my care of the patient were reviewed by me and considered in my medical decision making (see chart for details).  Clinical Course    35 yo F With a chief complaint of cough congestion fevers chills vomiting and diarrhea. Going on after receiving the flu shot a few days ago. Patient is tachycardic into the 120s in triage. Will obtain a laboratory evaluation give a fluid bolus.  Symptomatically improved with fluid bolus heart rate improved as well. Chest x-ray negative labs otherwise unremarkable. Awaiting UA.  The patients results and plan were reviewed and discussed.   Any x-rays performed were independently reviewed by myself.   Differential diagnosis were considered with the presenting HPI.  Medications  sodium chloride 0.9 % bolus 1,000 mL (0 mLs Intravenous Stopped 01/29/16 1741)  acetaminophen  (TYLENOL) tablet 650 mg (650 mg Oral Given 01/29/16 1715)  amoxicillin (AMOXIL) capsule 500 mg (500 mg Oral Given 01/29/16 1739)  Vitals:   01/29/16 1319 01/29/16 1320 01/29/16 1543 01/29/16 1713  BP: 114/87  117/76 118/73  Pulse: (!) 121  106 105  Resp: 17  18 18   Temp: 99 F (37.2 C)  99 F (37.2 C) 99.8 F (37.7 C)  TempSrc: Oral  Oral Oral  SpO2: 97%  98% 99%  Weight:  145 lb (65.8 kg)    Height:  5\' 3"  (1.6 m)      Final diagnoses:  Influenza-like illness     Final Clinical Impressions(s) / ED Diagnoses   Final diagnoses:  Influenza-like illness    New Prescriptions Discharge Medication List as of 01/29/2016  5:34 PM    START taking these medications   Details  amoxicillin (AMOXIL) 500 MG capsule Take 1 capsule (500 mg total) by mouth 3 (three) times daily., Starting Sun 01/29/2016, Print    benzonatate (TESSALON) 100 MG capsule Take 1 capsule (100 mg total) by mouth every 8 (eight) hours., Starting Sun 01/29/2016, Print    ondansetron (ZOFRAN ODT) 4 MG disintegrating tablet 4mg  ODT q4 hours prn nausea/vomit, Print         Deno Etienne, DO 01/30/16 [id number]

## 2016-01-29 NOTE — Discharge Instructions (Signed)
Take tylenol 2 pills 4 times a day and motrin 4 pills 3 times a day.  Drink plenty of fluids.  Return for worsening shortness of breath, headache, confusion. Follow up with your family doctor.   

## 2016-01-29 NOTE — ED Triage Notes (Addendum)
PT C/O FEVER, WEAKNESS, DRY COUGH, AND NAUSEA SINCE LAST NIGHT. PT STS SHE RECEIVED THE FLU SHOT ON Wednesday. PT STS SHE HAD A WOUND VAC APPLIED ON Friday, AND THINKS IT MAY BE CAUSING HER TO GET SICK.

## 2016-01-29 NOTE — ED Notes (Signed)
Patient was alert, oriented and stable upon discharge. RN went over AVS and patient had no further questions.  

## 2016-01-31 LAB — URINE CULTURE

## 2016-02-01 NOTE — Telephone Encounter (Signed)
As long as the transverse myelitis wasn't triggered by a flu shot, then it is okay for her to get a flu shot.

## 2016-02-01 NOTE — Telephone Encounter (Signed)
Message relayed to patient. Verbalized understanding and denied questions.   

## 2016-02-02 ENCOUNTER — Encounter (HOSPITAL_BASED_OUTPATIENT_CLINIC_OR_DEPARTMENT_OTHER): Payer: Medicaid Other | Attending: Internal Medicine

## 2016-02-02 DIAGNOSIS — G8220 Paraplegia, unspecified: Secondary | ICD-10-CM | POA: Diagnosis not present

## 2016-02-02 DIAGNOSIS — L89324 Pressure ulcer of left buttock, stage 4: Secondary | ICD-10-CM | POA: Diagnosis present

## 2016-02-02 DIAGNOSIS — G35 Multiple sclerosis: Secondary | ICD-10-CM | POA: Diagnosis not present

## 2016-02-15 ENCOUNTER — Emergency Department (HOSPITAL_COMMUNITY): Payer: Medicaid Other

## 2016-02-15 ENCOUNTER — Encounter (HOSPITAL_COMMUNITY): Payer: Self-pay | Admitting: *Deleted

## 2016-02-15 ENCOUNTER — Inpatient Hospital Stay (HOSPITAL_COMMUNITY): Payer: Medicaid Other

## 2016-02-15 ENCOUNTER — Inpatient Hospital Stay (HOSPITAL_COMMUNITY)
Admission: EM | Admit: 2016-02-15 | Discharge: 2016-02-25 | DRG: 477 | Disposition: A | Discharge status: Home / Self Care | Payer: Medicaid Other | Attending: Internal Medicine | Admitting: Internal Medicine

## 2016-02-15 DIAGNOSIS — E43 Unspecified severe protein-calorie malnutrition: Secondary | ICD-10-CM | POA: Diagnosis present

## 2016-02-15 DIAGNOSIS — L89154 Pressure ulcer of sacral region, stage 4: Secondary | ICD-10-CM | POA: Diagnosis present

## 2016-02-15 DIAGNOSIS — Z87440 Personal history of urinary (tract) infections: Secondary | ICD-10-CM | POA: Diagnosis not present

## 2016-02-15 DIAGNOSIS — Z7401 Bed confinement status: Secondary | ICD-10-CM | POA: Diagnosis not present

## 2016-02-15 DIAGNOSIS — L8990 Pressure ulcer of unspecified site, unspecified stage: Secondary | ICD-10-CM | POA: Diagnosis not present

## 2016-02-15 DIAGNOSIS — Z6825 Body mass index (BMI) 25.0-25.9, adult: Secondary | ICD-10-CM

## 2016-02-15 DIAGNOSIS — G8220 Paraplegia, unspecified: Secondary | ICD-10-CM | POA: Diagnosis present

## 2016-02-15 DIAGNOSIS — IMO0002 Reserved for concepts with insufficient information to code with codable children: Secondary | ICD-10-CM

## 2016-02-15 DIAGNOSIS — Z792 Long term (current) use of antibiotics: Secondary | ICD-10-CM

## 2016-02-15 DIAGNOSIS — G373 Acute transverse myelitis in demyelinating disease of central nervous system: Secondary | ICD-10-CM | POA: Diagnosis present

## 2016-02-15 DIAGNOSIS — B9689 Other specified bacterial agents as the cause of diseases classified elsewhere: Secondary | ICD-10-CM | POA: Diagnosis not present

## 2016-02-15 DIAGNOSIS — M609 Myositis, unspecified: Secondary | ICD-10-CM | POA: Diagnosis present

## 2016-02-15 DIAGNOSIS — K219 Gastro-esophageal reflux disease without esophagitis: Secondary | ICD-10-CM | POA: Diagnosis present

## 2016-02-15 DIAGNOSIS — Z79891 Long term (current) use of opiate analgesic: Secondary | ICD-10-CM

## 2016-02-15 DIAGNOSIS — R000 Tachycardia, unspecified: Secondary | ICD-10-CM | POA: Diagnosis present

## 2016-02-15 DIAGNOSIS — R509 Fever, unspecified: Secondary | ICD-10-CM

## 2016-02-15 DIAGNOSIS — L8994 Pressure ulcer of unspecified site, stage 4: Secondary | ICD-10-CM

## 2016-02-15 DIAGNOSIS — M869 Osteomyelitis, unspecified: Principal | ICD-10-CM

## 2016-02-15 DIAGNOSIS — L98499 Non-pressure chronic ulcer of skin of other sites with unspecified severity: Secondary | ICD-10-CM | POA: Diagnosis present

## 2016-02-15 DIAGNOSIS — Z936 Other artificial openings of urinary tract status: Secondary | ICD-10-CM | POA: Diagnosis not present

## 2016-02-15 DIAGNOSIS — R197 Diarrhea, unspecified: Secondary | ICD-10-CM | POA: Diagnosis present

## 2016-02-15 DIAGNOSIS — Z91040 Latex allergy status: Secondary | ICD-10-CM | POA: Diagnosis not present

## 2016-02-15 DIAGNOSIS — M216X2 Other acquired deformities of left foot: Secondary | ICD-10-CM | POA: Diagnosis present

## 2016-02-15 DIAGNOSIS — D649 Anemia, unspecified: Secondary | ICD-10-CM | POA: Diagnosis present

## 2016-02-15 DIAGNOSIS — R400 Somnolence: Secondary | ICD-10-CM | POA: Diagnosis not present

## 2016-02-15 DIAGNOSIS — M216X1 Other acquired deformities of right foot: Secondary | ICD-10-CM | POA: Diagnosis present

## 2016-02-15 DIAGNOSIS — L089 Local infection of the skin and subcutaneous tissue, unspecified: Secondary | ICD-10-CM

## 2016-02-15 DIAGNOSIS — L89894 Pressure ulcer of other site, stage 4: Secondary | ICD-10-CM | POA: Diagnosis not present

## 2016-02-15 DIAGNOSIS — G35 Multiple sclerosis: Secondary | ICD-10-CM | POA: Diagnosis present

## 2016-02-15 DIAGNOSIS — E663 Overweight: Secondary | ICD-10-CM | POA: Diagnosis present

## 2016-02-15 DIAGNOSIS — F329 Major depressive disorder, single episode, unspecified: Secondary | ICD-10-CM | POA: Diagnosis present

## 2016-02-15 DIAGNOSIS — E876 Hypokalemia: Secondary | ICD-10-CM | POA: Diagnosis present

## 2016-02-15 DIAGNOSIS — M8618 Other acute osteomyelitis, other site: Secondary | ICD-10-CM | POA: Diagnosis not present

## 2016-02-15 DIAGNOSIS — Z881 Allergy status to other antibiotic agents status: Secondary | ICD-10-CM

## 2016-02-15 DIAGNOSIS — R159 Full incontinence of feces: Secondary | ICD-10-CM

## 2016-02-15 DIAGNOSIS — M62838 Other muscle spasm: Secondary | ICD-10-CM | POA: Diagnosis not present

## 2016-02-15 DIAGNOSIS — Z87442 Personal history of urinary calculi: Secondary | ICD-10-CM

## 2016-02-15 LAB — COMPREHENSIVE METABOLIC PANEL
ALBUMIN: 3.6 g/dL (ref 3.5–5.0)
ALK PHOS: 93 U/L (ref 38–126)
ALT: 9 U/L — AB (ref 14–54)
AST: 10 U/L — AB (ref 15–41)
Anion gap: 11 (ref 5–15)
BILIRUBIN TOTAL: 1.2 mg/dL (ref 0.3–1.2)
BUN: 9 mg/dL (ref 6–20)
CO2: 28 mmol/L (ref 22–32)
CREATININE: 0.41 mg/dL — AB (ref 0.44–1.00)
Calcium: 8.6 mg/dL — ABNORMAL LOW (ref 8.9–10.3)
Chloride: 98 mmol/L — ABNORMAL LOW (ref 101–111)
GFR calc Af Amer: >60 mL/min (ref >60)
GLUCOSE: 92 mg/dL (ref 65–99)
POTASSIUM: 2.9 mmol/L — AB (ref 3.5–5.1)
Sodium: 137 mmol/L (ref 135–145)
TOTAL PROTEIN: 6.7 g/dL (ref 6.5–8.1)

## 2016-02-15 LAB — CBC WITH DIFFERENTIAL/PLATELET
Basophils Absolute: 0 10*3/uL (ref 0.0–0.1)
Basophils Relative: 0 %
EOS ABS: 0.1 10*3/uL (ref 0.0–0.7)
EOS PCT: 1 %
HCT: 37.1 % (ref 36.0–46.0)
Hemoglobin: 11.6 g/dL — ABNORMAL LOW (ref 12.0–15.0)
LYMPHS ABS: 0.8 10*3/uL (ref 0.7–4.0)
LYMPHS PCT: 7 %
MCH: 25.7 pg — AB (ref 26.0–34.0)
MCHC: 31.3 g/dL (ref 30.0–36.0)
MCV: 82.1 fL (ref 78.0–100.0)
MONO ABS: 0.4 10*3/uL (ref 0.1–1.0)
Monocytes Relative: 4 %
Neutro Abs: 9.5 10*3/uL — ABNORMAL HIGH (ref 1.7–7.7)
Neutrophils Relative %: 88 %
PLATELETS: 293 10*3/uL (ref 150–400)
RBC: 4.52 MIL/uL (ref 3.87–5.11)
RDW: 16.5 % — AB (ref 11.5–15.5)
WBC: 10.7 10*3/uL — AB (ref 4.0–10.5)

## 2016-02-15 LAB — URINE MICROSCOPIC-ADD ON: RBC / HPF: NONE SEEN RBC/hpf (ref 0–5)

## 2016-02-15 LAB — URINALYSIS, ROUTINE W REFLEX MICROSCOPIC
GLUCOSE, UA: NEGATIVE mg/dL
HGB URINE DIPSTICK: NEGATIVE
Ketones, ur: NEGATIVE mg/dL
Nitrite: NEGATIVE
PROTEIN: 100 mg/dL — AB
Specific Gravity, Urine: 1.019 (ref 1.005–1.030)
pH: 8.5 — ABNORMAL HIGH (ref 5.0–8.0)

## 2016-02-15 LAB — LACTIC ACID, PLASMA: LACTIC ACID, VENOUS: 1 mmol/L (ref 0.5–1.9)

## 2016-02-15 LAB — APTT: APTT: 36 s (ref 24–36)

## 2016-02-15 LAB — MAGNESIUM: MAGNESIUM: 2 mg/dL (ref 1.7–2.4)

## 2016-02-15 LAB — PREGNANCY, URINE: PREG TEST UR: NEGATIVE

## 2016-02-15 LAB — PROTIME-INR
INR: 1.15
Prothrombin Time: 14.7 seconds (ref 11.4–15.2)

## 2016-02-15 LAB — SEDIMENTATION RATE: Sed Rate: 14 mm/hr (ref 0–22)

## 2016-02-15 MED ORDER — BISACODYL 10 MG RE SUPP: 10.0000 mg | Freq: Every day | RECTAL | Status: DC | PRN

## 2016-02-15 MED ORDER — DIAZEPAM 5 MG PO TABS
10.0000 mg | ORAL_TABLET | Freq: Every day | ORAL | Status: DC
  Administered 2016-02-16 – 2016-02-19 (×5): 10 mg via ORAL
  Filled 2016-02-15 (×5): qty 2

## 2016-02-15 MED ORDER — FLUTICASONE PROPIONATE 50 MCG/ACT NA SUSP: 2.0000 | Freq: Every day | NASAL | Status: DC | PRN

## 2016-02-15 MED ORDER — PIPERACILLIN-TAZOBACTAM 3.375 G IVPB 30 MIN
3.3750 g | Freq: Once | INTRAVENOUS | Status: AC
Start: 2016-02-15 — End: 2016-02-15
  Administered 2016-02-15: 3.375 g via INTRAVENOUS
  Filled 2016-02-15: qty 50

## 2016-02-15 MED ORDER — ONDANSETRON HCL 4 MG PO TABS
4.0000 mg | ORAL_TABLET | Freq: Four times a day (QID) | ORAL | Status: DC | PRN
  Administered 2016-02-23: 4 mg via ORAL
  Filled 2016-02-15: qty 1

## 2016-02-15 MED ORDER — PIPERACILLIN-TAZOBACTAM 3.375 G IVPB
3.3750 g | Freq: Three times a day (TID) | INTRAVENOUS | Status: DC
  Administered 2016-02-16 (×2): 3.375 g via INTRAVENOUS
  Filled 2016-02-15 (×2): qty 50

## 2016-02-15 MED ORDER — LORATADINE 10 MG PO TABS: 5.0000 mg | ORAL_TABLET | Freq: Every day | ORAL | Status: DC | PRN

## 2016-02-15 MED ORDER — DIAZEPAM 5 MG PO TABS
5.0000 mg | ORAL_TABLET | ORAL | Status: DC
  Administered 2016-02-16 – 2016-02-25 (×19): 5 mg via ORAL
  Filled 2016-02-15 (×19): qty 1

## 2016-02-15 MED ORDER — KCL IN DEXTROSE-NACL 40-5-0.45 MEQ/L-%-% IV SOLN
INTRAVENOUS | Status: DC
  Administered 2016-02-15: 21:00:00 via INTRAVENOUS
  Filled 2016-02-15 (×4): qty 1000

## 2016-02-15 MED ORDER — PANTOPRAZOLE SODIUM 40 MG PO TBEC
40.0000 mg | DELAYED_RELEASE_TABLET | Freq: Every day | ORAL | Status: DC
  Administered 2016-02-16 – 2016-02-25 (×10): 40 mg via ORAL
  Filled 2016-02-15 (×10): qty 1

## 2016-02-15 MED ORDER — GADOBENATE DIMEGLUMINE 529 MG/ML IV SOLN
15.0000 mL | Freq: Once | INTRAVENOUS | Status: AC | PRN
  Administered 2016-02-15: 13 mL via INTRAVENOUS

## 2016-02-15 MED ORDER — ONDANSETRON HCL 4 MG/2ML IJ SOLN
4.0000 mg | Freq: Four times a day (QID) | INTRAMUSCULAR | Status: DC | PRN
  Filled 2016-02-15: qty 2

## 2016-02-15 MED ORDER — NAPROXEN SODIUM 275 MG PO TABS: 275.0000 mg | ORAL_TABLET | Freq: Three times a day (TID) | ORAL | Status: DC | PRN

## 2016-02-15 MED ORDER — LAMOTRIGINE 25 MG PO TABS
125.0000 mg | ORAL_TABLET | Freq: Every day | ORAL | Status: DC
  Administered 2016-02-16 – 2016-02-25 (×10): 125 mg via ORAL
  Filled 2016-02-15 (×11): qty 1

## 2016-02-15 MED ORDER — ENOXAPARIN SODIUM 40 MG/0.4ML ~~LOC~~ SOLN
40.0000 mg | Freq: Every day | SUBCUTANEOUS | Status: DC
  Administered 2016-02-16 – 2016-02-18 (×4): 40 mg via SUBCUTANEOUS
  Filled 2016-02-15 (×5): qty 0.4

## 2016-02-15 MED ORDER — BACLOFEN 20 MG PO TABS
20.0000 mg | ORAL_TABLET | Freq: Three times a day (TID) | ORAL | Status: DC
  Administered 2016-02-16 – 2016-02-25 (×30): 20 mg via ORAL
  Filled 2016-02-15 (×30): qty 1

## 2016-02-15 MED ORDER — SERTRALINE HCL 50 MG PO TABS
100.0000 mg | ORAL_TABLET | Freq: Every day | ORAL | Status: DC
  Administered 2016-02-16 – 2016-02-25 (×10): 100 mg via ORAL
  Filled 2016-02-15 (×10): qty 2

## 2016-02-15 MED ORDER — SODIUM CHLORIDE 0.9 % IV BOLUS (SEPSIS)
1000.0000 mL | Freq: Once | INTRAVENOUS | Status: AC
  Administered 2016-02-15: 1000 mL via INTRAVENOUS

## 2016-02-15 MED ORDER — GABAPENTIN 300 MG PO CAPS
600.0000 mg | ORAL_CAPSULE | Freq: Three times a day (TID) | ORAL | Status: DC
  Administered 2016-02-16 – 2016-02-17 (×5): 600 mg via ORAL
  Filled 2016-02-15 (×6): qty 2

## 2016-02-15 MED ORDER — DOXYCYCLINE HYCLATE 100 MG IV SOLR
100.0000 mg | Freq: Two times a day (BID) | INTRAVENOUS | Status: DC
  Administered 2016-02-15 – 2016-02-16 (×2): 100 mg via INTRAVENOUS
  Filled 2016-02-15 (×5): qty 100

## 2016-02-15 MED ORDER — ACETAMINOPHEN 325 MG PO TABS
650.0000 mg | ORAL_TABLET | Freq: Four times a day (QID) | ORAL | Status: DC | PRN
  Filled 2016-02-15: qty 2

## 2016-02-15 MED ORDER — SENNOSIDES-DOCUSATE SODIUM 8.6-50 MG PO TABS: 1.0000 | ORAL_TABLET | Freq: Two times a day (BID) | ORAL | Status: DC | PRN

## 2016-02-15 NOTE — ED Triage Notes (Signed)
She reports recent intermittent fever and her home "wound nurse told me to come here because the fever might be from my bedsore". She has numerous white things ?nits? In her hair.

## 2016-02-15 NOTE — Progress Notes (Signed)
Pharmacy Antibiotic Note  [name] is a 35 y.o. female admitted on 02/15/2016 with infected decubitus ulcer, possible UTI, and signs of sepsis. Pharmacy has been consulted for Zosyn dosing; Doxycycline per MD as patient is allergic to vancomycin and clindamycin  Plan:  Zosyn 3.375 g IV given once over 30 minutes, then every 8 hrs by 4-hr infusion  Doxycycline per MD, dosing appropriate    Temp (24hrs), Avg:99.3 F (37.4 C), Min:98.3 F (36.8 C), Max:101.4 F (38.6 C)   Recent Labs Lab 02/15/16 1548  WBC 10.7*  CREATININE 0.41*    CrCl cannot be calculated (Unknown ideal weight.).    Allergies  Allergen Reactions  . Iodine Anaphylaxis  . Latex Anaphylaxis  . Other Anaphylaxis    ALL -MYCINS. Pt states she has had all "-mycins" in all classes and all of them cause reactions.    Marland Kitchen Shellfish-Derived Products Anaphylaxis    Reaction unknown  . Vancomycin Anaphylaxis  . Aspirin Other (See Comments)    Reaction unknown  . Strawberry Extract Hives and Rash    Antimicrobials this admission: Zosyn 11/15 >>  Doxycycline (MD) 11/15 >>   Dose adjustments this admission: ---  Microbiology results: 11/15 BCx: seng 11/15 UCx: sent   Thank you for allowing pharmacy to be a part of this patient's care.  Reuel Boom, PharmD, BCPS Pager: [phone] 02/15/2016, 7:37 PM

## 2016-02-15 NOTE — ED Provider Notes (Signed)
[facility] DEPT Provider Note   CSN: [id number] Arrival date & time: 02/15/16  1423     History   Chief Complaint Chief Complaint  Patient presents with  . Fever  . infected bedsore?    HPI [name] is a 35 y.o. female.  HPI Patient presents after being sent in by her wound care nurse and doctor. Reportedly sent for IV antibiotics. Has a history of transverse myelitis and is a paraplegic. Has had fevers since the beginning October but his been worse last 3 days. No cough. She has had nausea but states she gets like this when she has an infection. She has a urostomy but states it always has bacteria. She states her wound care nurse told her that her left buttock wound is infected. She has a chronic wound VAC on. Reportedly has had an increase in the drainage. No diarrhea. No abdominal pain. Past Medical History:  Diagnosis Date  . Bladder stones    Hx of  . Depression   . GERD (gastroesophageal reflux disease)   . Multiple sclerosis ([place])   . Paraplegia ([place])   . PTSD (post-traumatic stress disorder)   . Transverse myelitis [facility])     Patient Active Problem List   Diagnosis Date Noted  . Infected decubitus ulcer 02/15/2016  . Pressure injury of skin 01/08/2016  . Nausea & vomiting 01/07/2016  . Ileus ([place]) 10/29/2014  . Constipation 10/29/2014  . Obstipation 09/27/2014  . UTI (urinary tract infection) 03/31/2013  . Influenza 03/30/2013  . Nausea and vomiting 03/30/2013  . Hypokalemia 02/16/2012  . Multiple sclerosis ([place]) 10/16/2011  . Paraplegia [facility])     Past Surgical History:  Procedure Laterality Date  . CHOLECYSTECTOMY    . CYSTECTOMY W/ URETEROILEAL CONDUIT    . TONSILLECTOMY    . WISDOM TOOTH EXTRACTION      OB History    No data available       Home Medications    Prior to Admission medications   Medication Sig Start Date End Date Taking? Authorizing Provider  acetaminophen (TYLENOL) 325 MG tablet Take 650 mg by mouth every 6 (six)  hours as needed for fever.   Yes Historical Provider, MD  baclofen (LIORESAL) 20 MG tablet Take 20 mg by mouth 3 (three) times daily.   Yes Historical Provider, MD  bisacodyl (DULCOLAX) 10 MG suppository Place 1 suppository (10 mg total) rectally daily as needed for moderate constipation. 10/31/14  Yes Nishant Dhungel, MD  diazepam (VALIUM) 5 MG tablet Take 1-2 tablets (5-10 mg total) by mouth every 8 (eight) hours. Take 1 tablet in AM, 1 tablet in PM, and 2 tablets at bedtime Patient taking differently: Take 5-10 mg by mouth 3 (three) times daily. Take 1 tablet in AM, 1 tablet in PM, and 2 tablets at bedtime 04/02/13  Yes Robbie Lis, MD  fluticasone [facility]) 50 MCG/ACT nasal spray Place 2 sprays into the nose daily as needed for allergies.    Yes Historical Provider, MD  gabapentin (NEURONTIN) 300 MG capsule Take 2 capsules (600 mg total) by mouth 3 (three) times daily. 01/13/16  Yes Adam Telford Nab, DO  ibuprofen (ADVIL,MOTRIN) 200 MG tablet Take 400 mg by mouth every 6 (six) hours as needed for headache, mild pain or moderate pain.    Yes Historical Provider, MD  lamoTRIgine (LAMICTAL) 25 MG tablet Take 125 mg by mouth daily.   Yes Historical Provider, MD  loratadine (CLARITIN) 5 MG/5ML syrup Take 5 mg by mouth  daily.   Yes Historical Provider, MD  lubiprostone (AMITIZA) 24 MCG capsule Take 24 mcg by mouth 2 (two) times daily with a meal.   Yes Historical Provider, MD  naproxen sodium (ANAPROX) 220 MG tablet Take 220-440 mg by mouth 3 (three) times daily as needed (migraine/headache).   Yes Historical Provider, MD  omeprazole (PRILOSEC) 40 MG capsule Take 40 mg by mouth daily.   Yes Historical Provider, MD  ondansetron (ZOFRAN ODT) 4 MG disintegrating tablet 4mg  ODT q4 hours prn nausea/vomit 01/29/16  Yes Deno Etienne, DO  senna-docusate (SENOKOT-S) 8.6-50 MG per tablet Take 1 tablet by mouth 2 (two) times daily. Patient taking differently: Take 1 tablet by mouth 2 (two) times daily as needed for mild  constipation or moderate constipation.  10/31/14  Yes Nishant Dhungel, MD  sertraline (ZOLOFT) 100 MG tablet Take 100 mg by mouth daily.   Yes Historical Provider, MD  amoxicillin (AMOXIL) 500 MG capsule Take 1 capsule (500 mg total) by mouth 3 (three) times daily. Patient not taking: Reported on 02/15/2016 01/29/16   Quintella Reichert, MD  benzonatate (TESSALON) 100 MG capsule Take 1 capsule (100 mg total) by mouth every 8 (eight) hours. Patient not taking: Reported on 02/15/2016 01/29/16   Deno Etienne, DO    Family History Family History  Problem Relation Age of Onset  . Diabetes type II Father   . CAD Father   . Dementia Father   . Stroke Father   . Seizures Father   . Diabetes type II Mother   . Hypertension Mother   . Breast cancer Other   . Diabetes type II Other     Social History Social History  Substance Use Topics  . Smoking status: Never Smoker  . Smokeless tobacco: Never Used  . Alcohol use No     Allergies   Iodine; Latex; Other; Shellfish-derived products; Vancomycin; Aspirin; and Strawberry extract   Review of Systems Review of Systems  Constitutional: Positive for appetite change and fever.  HENT: Negative for congestion.   Eyes: Negative for photophobia.  Respiratory: Negative for shortness of breath.   Cardiovascular: Negative for chest pain.  Gastrointestinal: Positive for nausea. Negative for abdominal pain and vomiting.  Genitourinary: Negative for flank pain.  Musculoskeletal: Negative for back pain.  Skin: Positive for wound.  Neurological: Positive for weakness.  Hematological: Negative for adenopathy.     Physical Exam Updated Vital Signs BP 120/72   Pulse 110   Temp 101.4 F (38.6 C) (Rectal)   Resp 18   LMP 01/15/2016   SpO2 100%   Physical Exam  Constitutional: She appears well-developed.  HENT:  Head: Atraumatic.  Eyes: EOM are normal.  Neck: Neck supple.  Cardiovascular:  Mild tachycardia  Pulmonary/Chest: Effort normal.    Abdominal: Soft. There is no tenderness.  Urostomy right lower abdominal wall.  Neurological: She is alert.  Chronic weakness of bilateral lower extremities. Some weakness in upper extremities also.  Skin: Skin is warm.  Large deep decubitus ulcer to left buttock area. Wound VAC removed.     ED Treatments / Results  Labs (all labs ordered are listed, but only abnormal results are displayed) Labs Reviewed  COMPREHENSIVE METABOLIC PANEL - Abnormal; Notable for the following:       Result Value   Potassium 2.9 (*)    Chloride 98 (*)    Creatinine, Ser 0.41 (*)    Calcium 8.6 (*)    AST 10 (*)    ALT 9 (*)  All other components within normal limits  URINALYSIS, ROUTINE W REFLEX MICROSCOPIC (NOT AT [facility]) - Abnormal; Notable for the following:    Color, Urine AMBER (*)    APPearance TURBID (*)    pH 8.5 (*)    Bilirubin Urine SMALL (*)    Protein, ur 100 (*)    Leukocytes, UA MODERATE (*)    All other components within normal limits  CBC WITH DIFFERENTIAL/PLATELET - Abnormal; Notable for the following:    WBC 10.7 (*)    Hemoglobin 11.6 (*)    MCH 25.7 (*)    RDW 16.5 (*)    Neutro Abs 9.5 (*)    All other components within normal limits  URINE MICROSCOPIC-ADD ON - Abnormal; Notable for the following:    Squamous Epithelial / LPF 0-5 (*)    Bacteria, UA FEW (*)    Crystals TRIPLE PHOSPHATE CRYSTALS (*)    All other components within normal limits  CULTURE, BLOOD (ROUTINE X 2)  CULTURE, BLOOD (ROUTINE X 2)  URINE CULTURE  PREGNANCY, URINE    EKG  EKG Interpretation None       Radiology Dg Chest Port 1 View  Result Date: 02/15/2016 CLINICAL DATA:  Intermittent fever and nausea. EXAM: PORTABLE CHEST 1 VIEW COMPARISON:  01/29/2016 FINDINGS: Both lungs are clear. Heart and mediastinum are within normal limits. The trachea is midline. Negative for a pneumothorax. No acute bone abnormality. IMPRESSION: No active disease. Electronically Signed   By: Markus Daft M.D.    On: 02/15/2016 16:34    Procedures Procedures (including critical care time)  Medications Ordered in ED Medications  sodium chloride 0.9 % bolus 1,000 mL (0 mLs Intravenous Stopped 02/15/16 1719)     Initial Impression / Assessment and Plan / ED Course  I have reviewed the triage vital signs and the nursing notes.  Pertinent labs & imaging results that were available during my care of the patient were reviewed by me and considered in my medical decision making (see chart for details).  Clinical Course     Patient with fever. Potentially from decubitus ulcer.No hypotension. Antibiotics started. Will admit to internal medicine. Discussed with Dr. Sheran Fava.  Final Clinical Impressions(s) / ED Diagnoses   Final diagnoses:  Infected decubitus ulcer, unspecified ulcer stage    New Prescriptions New Prescriptions   No medications on file     Davonna Belling, MD 02/15/16 1754

## 2016-02-15 NOTE — ED Notes (Signed)
Report given to [facility] on the 5th floor.

## 2016-02-15 NOTE — H&P (Signed)
History and Physical    [name] WVP:[id number] DOB: 24-Aug-1980 DOA: 02/15/2016  Referring MD/NP/PA: Davonna Belling PCP: Simona Huh, MD  Outpatient Specialists: Dr. Asa Saunas   Patient coming from: home  Chief Complaint: fevers  HPI: [name] is a 35 y.o. 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  She has a stage 4 sacral ulcer which is followed by the wound care clinic and she has a chronic wound vac.  For the last month, she has had intermittent fevers which have been temporarily suppressed by antibiotics such as keflex.  She was also diagnosed with possible viral illness.  Over the last three days, her fevers returned but have been more consistent and higher, up to 101.280F with associated nausea, chills, sweats.  Her home health RN noticed increased erythema and purulence from her chronic sacral wound which were new.  The [facility] RN contacted Dr. Asa Saunas who recommended admission to the hospital for assessment and treatment of what is likely an infected sacral decubitous ulcer.    ED Course:  In the ER, VS notable for T = 101.80F, tachycardic to the low 100s.  WBC 10.7.  Potassium 2.9.  UA demonstrated only few bacteria and 6-30 WBC.  CXR was unremarkable.    Review of Systems:  General:  Positive fevers, chills HEENT:  Denies rhinorrhea, worsening sinus congestion, sore throat CV:  Denies chest pain and palpitations, has chronic right lower extremity edema.  PULM:  Denies SOB, wheezing, cough.   GI:  Positive nausea, one episode of nonbilious, nonbloody vomiting, constipation resolved after bisacodyl suppository but since then she has had soft to watery diarrhea GU:  Denies dysuria, frequency, urgency ENDO:  Denies polyuria, polydipsia.   HEME:  Denies  hematemesis, blood in stools, melena, abnormal bruising or bleeding.  LYMPH:  Denies lymphadenopathy.   MSK:  Denies arthralgias, myalgias.  Chronic muscle spasms DERM:  Denies skin rash  NEURO:  Denies slurred speech, confusion, facial droop.  paraplegic PSYCH:  Denies anxiety and depression.    Past Medical History:  Diagnosis Date  . Bladder stones    Hx of  . Depression   . GERD (gastroesophageal reflux disease)   . Multiple sclerosis ([place])   . Paraplegia ([place])   . PTSD (post-traumatic stress disorder)   . Transverse myelitis [facility])     Past Surgical History:  Procedure Laterality Date  . CHOLECYSTECTOMY    . CYSTECTOMY W/ URETEROILEAL CONDUIT    . TONSILLECTOMY    . WISDOM TOOTH EXTRACTION       reports that she has never smoked. She has never used smokeless tobacco. She reports that she does not drink alcohol or use drugs.  Allergies  Allergen Reactions  . Iodine Anaphylaxis  . Latex Anaphylaxis  . Other Anaphylaxis    ALL -MYCINS. Pt states she has had all "-mycins" in all classes and all of them cause reactions.    Marland Kitchen Shellfish-Derived Products Anaphylaxis    Reaction unknown  . Vancomycin Anaphylaxis  . Aspirin Other (See Comments)    Reaction unknown  . Strawberry Extract Hives and Rash    Family History  Problem Relation Age of Onset  . Diabetes type II Father   . CAD Father   . Dementia Father   . Stroke Father   .  Seizures Father   . Diabetes type II Mother   . Hypertension Mother   . Breast cancer Other   . Diabetes type II Other     Prior to Admission medications   Medication Sig Start Date End Date Taking? Authorizing Provider  acetaminophen (TYLENOL) 325 MG tablet Take 650 mg by mouth every 6 (six) hours as needed for fever.   Yes Historical Provider, MD  baclofen (LIORESAL) 20 MG tablet Take 20 mg by mouth 3 (three) times daily.   Yes Historical Provider, MD  bisacodyl (DULCOLAX) 10 MG suppository Place 1 suppository (10 mg total) rectally  daily as needed for moderate constipation. 10/31/14  Yes Nishant Dhungel, MD  diazepam (VALIUM) 5 MG tablet Take 1-2 tablets (5-10 mg total) by mouth every 8 (eight) hours. Take 1 tablet in AM, 1 tablet in PM, and 2 tablets at bedtime Patient taking differently: Take 5-10 mg by mouth 3 (three) times daily. Take 1 tablet in AM, 1 tablet in PM, and 2 tablets at bedtime 04/02/13  Yes Alma M Devine, MD  fluticasone (FLONASE) 50 MCG/ACT nasal spray Place 2 sprays into the nose daily as needed for allergies.    Yes Historical Provider, MD  gabapentin (NEURONTIN) 300 MG capsule Take 2 capsules (600 mg total) by mouth 3 (three) times daily. 01/13/16  Yes Adam R Jaffe, DO  ibuprofen (ADVIL,MOTRIN) 200 MG tablet Take 400 mg by mouth every 6 (six) hours as needed for headache, mild pain or moderate pain.    Yes Historical Provider, MD  lamoTRIgine (LAMICTAL) 25 MG tablet Take 125 mg by mouth daily.   Yes Historical Provider, MD  loratadine (CLARITIN) 5 MG/5ML syrup Take 5 mg by mouth daily.   Yes Historical Provider, MD  lubiprostone (AMITIZA) 24 MCG capsule Take 24 mcg by mouth 2 (two) times daily with a meal.   Yes Historical Provider, MD  naproxen sodium (ANAPROX) 220 MG tablet Take 220-440 mg by mouth 3 (three) times daily as needed (migraine/headache).   Yes Historical Provider, MD  omeprazole (PRILOSEC) 40 MG capsule Take 40 mg by mouth daily.   Yes Historical Provider, MD  ondansetron (ZOFRAN ODT) 4 MG disintegrating tablet 4mg ODT q4 hours prn nausea/vomit 01/29/16  Yes Dan Floyd, DO  senna-docusate (SENOKOT-S) 8.6-50 MG per tablet Take 1 tablet by mouth 2 (two) times daily. Patient taking differently: Take 1 tablet by mouth 2 (two) times daily as needed for mild constipation or moderate constipation.  10/31/14  Yes Nishant Dhungel, MD  sertraline (ZOLOFT) 100 MG tablet Take 100 mg by mouth daily.   Yes Historical Provider, MD  amoxicillin (AMOXIL) 500 MG capsule Take 1 capsule (500 mg total) by mouth 3  (three) times daily. Patient not taking: Reported on 02/15/2016 01/29/16   Elizabeth Rees, MD  benzonatate (TESSALON) 100 MG capsule Take 1 capsule (100 mg total) by mouth every 8 (eight) hours. Patient not taking: Reported on 02/15/2016 01/29/16   Dan Floyd, DO    Physical Exam: Vitals:   02/15/16 1630 02/15/16 1700 02/15/16 1730 02/15/16 1800  BP: 132/81 120/72 109/68 100/84  Pulse: 108 110 111 111  Resp: 18     Temp:      TempSrc:      SpO2: 100%         Constitutional: NAD, calm, comfortable Eyes: PERRL, lids and conjunctivae normal ENMT: Mucous membranes are moist. Posterior pharynx clear of any exudate or lesions.  Poor dentition  Neck: normal, supple, no masses, no thyromegaly   Respiratory: clear to auscultation bilaterally, no wheezing, no crackles. Normal respiratory effort. No accessory muscle use.  Cardiovascular:  Mild tachycardia, regular rhythm, no murmurs / rubs / gallops. 2+ pedal pulses.  Trace edema of the right ankle.  Abdomen: no tenderness, no masses palpated. No hepatosplenomegaly. Bowel sounds positive.  Urostomy in right lower quadrant with urine present.  No obvious surrounding erythema or induration Musculoskeletal:  Positive clubbing / cyanosis. Contractures of lower extremities with some muscle spasms.  Decreased muscle tone/muscle bulk.   Skin:  Large sacral ulcer that tracts to bone.  Superior and left aspect were milked which expressed copious pus.  Inferior border with some increased erythema according to mother.  No foul odor.   Neurologic: CN 2-12 grossly intact. Decreased sensation from waist down, paraplegic.  Psychiatric: Normal judgment and insight. Alert and oriented x 3. Normal mood.   Labs on Admission: I have personally reviewed following labs and imaging studies  CBC:  Recent Labs Lab 02/15/16 1548  WBC 10.7*  NEUTROABS 9.5*  HGB 11.6*  HCT 37.1  MCV 82.1  PLT 242   Basic Metabolic Panel:  Recent Labs Lab 02/15/16 1548  NA  137  K 2.9*  CL 98*  CO2 28  GLUCOSE 92  BUN 9  CREATININE 0.41*  CALCIUM 8.6*   GFR: CrCl cannot be calculated (Unknown ideal weight.). Liver Function Tests:  Recent Labs Lab 02/15/16 1548  AST 10*  ALT 9*  ALKPHOS 93  BILITOT 1.2  PROT 6.7  ALBUMIN 3.6   No results for input(s): LIPASE, AMYLASE in the last 168 hours. No results for input(s): AMMONIA in the last 168 hours. Coagulation Profile: No results for input(s): INR, PROTIME in the last 168 hours. Cardiac Enzymes: No results for input(s): CKTOTAL, CKMB, CKMBINDEX, TROPONINI in the last 168 hours. BNP (last 3 results) No results for input(s): PROBNP in the last 8760 hours. HbA1C: No results for input(s): HGBA1C in the last 72 hours. CBG: No results for input(s): GLUCAP in the last 168 hours. Lipid Profile: No results for input(s): CHOL, HDL, LDLCALC, TRIG, CHOLHDL, LDLDIRECT in the last 72 hours. Thyroid Function Tests: No results for input(s): TSH, T4TOTAL, FREET4, T3FREE, THYROIDAB in the last 72 hours. Anemia Panel: No results for input(s): VITAMINB12, FOLATE, FERRITIN, TIBC, IRON, RETICCTPCT in the last 72 hours. Urine analysis:    Component Value Date/Time   COLORURINE AMBER (A) 02/15/2016 1559   APPEARANCEUR TURBID (A) 02/15/2016 1559   LABSPEC 1.019 02/15/2016 1559   PHURINE 8.5 (H) 02/15/2016 1559   GLUCOSEU NEGATIVE 02/15/2016 1559   HGBUR NEGATIVE 02/15/2016 1559   BILIRUBINUR SMALL (A) 02/15/2016 1559   KETONESUR NEGATIVE 02/15/2016 1559   PROTEINUR 100 (A) 02/15/2016 1559   UROBILINOGEN 2.0 (H) 10/29/2014 1627   NITRITE NEGATIVE 02/15/2016 1559   LEUKOCYTESUR MODERATE (A) 02/15/2016 1559   Sepsis Labs: _0 (procalcitonin:4,lacticidven:4) )No results found for this or any previous visit (from the past 240 hour(s)).   Radiological Exams on Admission: Dg Chest Port 1 View  Result Date: 02/15/2016 CLINICAL DATA:  Intermittent fever and nausea. EXAM: PORTABLE CHEST 1 VIEW COMPARISON:   01/29/2016 FINDINGS: Both lungs are clear. Heart and mediastinum are within normal limits. The trachea is midline. Negative for a pneumothorax. No acute bone abnormality. IMPRESSION: No active disease. Electronically Signed   By: Markus Daft M.D.   On: 02/15/2016 16:34    EKG: pending  Assessment/Plan Active Problems:   Infected decubitus ulcer    Possible sepsis with tachycardia, fevers,  and infected decubitus ulcer.  Lactic acid is pending.   -  Possible urine infection, f/u urine culture -  MRI pelvis/sacrum looking for osteomyelitis/abscess/tracking -  Check ESR/CRP -  Blood culture pending -  Due to signs of sepsis, will administer antibiotics:  Patient intolerant to vanc and clindamycin.  Will give doxycycline for now for MRSA coverage, zosyn for better strep, anaerobe coverage -  Air mattress -  Wet to dry dressings for now -  Wound care consultation  Hypokalemia due to poor oral intake and diarrhea, replace in IVF -  Repeat K in AM  Nausea, vomiting, and diarrhea, could be related to underlying infection.  Due to multiple courses of antibiotics, if stools are watery and copious, would send for C. Diff PCR.  Currently does not meet threshold for testing. -  IVF, antiemetics -  Check ECG for QTc  Paraplegia due to history of transverse myelitis  DVT prophylaxis:  lovenox  Code Status: full Family Communication: patient and her mother  Disposition Plan:  Likely to home in care of mother  Consults called:  Paged Plastics regarding consult in AM  Admission status: inpatient, Rosita Fire MD [facility] Pager [phone]  If 7PM-7AM, please contact night-coverage [email or web address] Password [facility]  02/15/2016, 6:38 PM

## 2016-02-15 NOTE — ED Notes (Signed)
Patient waiting for air mattress to arrive on the floor before being taken up.

## 2016-02-16 ENCOUNTER — Encounter (HOSPITAL_COMMUNITY): Payer: Self-pay | Admitting: Plastic Surgery

## 2016-02-16 DIAGNOSIS — M609 Myositis, unspecified: Secondary | ICD-10-CM

## 2016-02-16 DIAGNOSIS — M869 Osteomyelitis, unspecified: Secondary | ICD-10-CM

## 2016-02-16 LAB — BASIC METABOLIC PANEL
Anion gap: 10 (ref 5–15)
BUN: 6 mg/dL (ref 6–20)
CALCIUM: 8.3 mg/dL — AB (ref 8.9–10.3)
CO2: 24 mmol/L (ref 22–32)
CREATININE: 0.37 mg/dL — AB (ref 0.44–1.00)
Chloride: 102 mmol/L (ref 101–111)
GFR calc non Af Amer: >60 mL/min (ref >60)
Glucose, Bld: 98 mg/dL (ref 65–99)
Potassium: 2.7 mmol/L — CL (ref 3.5–5.1)
SODIUM: 136 mmol/L (ref 135–145)

## 2016-02-16 LAB — URINE CULTURE

## 2016-02-16 LAB — CBC
HCT: 32.5 % — ABNORMAL LOW (ref 36.0–46.0)
Hemoglobin: 10.2 g/dL — ABNORMAL LOW (ref 12.0–15.0)
MCH: 25.8 pg — ABNORMAL LOW (ref 26.0–34.0)
MCHC: 31.4 g/dL (ref 30.0–36.0)
MCV: 82.3 fL (ref 78.0–100.0)
PLATELETS: 258 10*3/uL (ref 150–400)
RBC: 3.95 MIL/uL (ref 3.87–5.11)
RDW: 16.7 % — AB (ref 11.5–15.5)
WBC: 8.6 10*3/uL (ref 4.0–10.5)

## 2016-02-16 LAB — LACTIC ACID, PLASMA: LACTIC ACID, VENOUS: 0.7 mmol/L (ref 0.5–1.9)

## 2016-02-16 LAB — PREALBUMIN: PREALBUMIN: 6.1 mg/dL — AB (ref 18–38)

## 2016-02-16 LAB — C-REACTIVE PROTEIN: CRP: 20.8 mg/dL — ABNORMAL HIGH (ref <1.0)

## 2016-02-16 MED ORDER — DAKINS (1/4 STRENGTH) 0.125 % EX SOLN
Freq: Two times a day (BID) | CUTANEOUS | Status: AC
  Administered 2016-02-16 – 2016-02-18 (×6)
  Filled 2016-02-16: qty 473

## 2016-02-16 MED ORDER — ACETAMINOPHEN 650 MG RE SUPP: 650.0000 mg | RECTAL | Status: DC | PRN

## 2016-02-16 MED ORDER — SODIUM CHLORIDE 0.9 % IV SOLN
30.0000 meq | Freq: Once | INTRAVENOUS | Status: AC
  Administered 2016-02-16: 30 meq via INTRAVENOUS
  Filled 2016-02-16: qty 15

## 2016-02-16 MED ORDER — SODIUM CHLORIDE 0.9 % IV BOLUS (SEPSIS): 500.0000 mL | Freq: Once | INTRAVENOUS | Status: DC

## 2016-02-16 NOTE — Progress Notes (Signed)
CRITICAL VALUE ALERT  Critical value received:  K+ 2.7  Date of notification:  [id number]  Time of notification:  58  Critical value read back: yes  Nurse who received alert:  VMJ  MD notified (1st page):  Schorr  Time of first page:  [phone]  MD notified (2nd page):na  Time of second page:na  Responding MD:  Schorr  Time MD responded:  [id number]

## 2016-02-16 NOTE — Consult Note (Addendum)
Reason for Consult: Sacral ulcer Referring Physician: Dr. [name]  [name] is an 35 y.o. female.  HPI: The patient is a 35 yrs old wf here for treatment of a sacral ulcer.  She has transverse myelitis which she has had for the past 12 yrs.  She had resulting paraplegia with urinary and bowel incontinence.   She was being treated at home with the [facility].  She noticed increase drainage and was sent to the ED.  The area is at the right ischial area.  The MRI results were reviewed.  There does not appear to be cellulitis but the peri-wound is maserated and inflamed.  Past Medical History:  Diagnosis Date  . Bladder stones    Hx of  . Depression   . GERD (gastroesophageal reflux disease)   . Multiple sclerosis ([place])   . Paraplegia ([place])   . PTSD (post-traumatic stress disorder)   . Transverse myelitis [facility])     Past Surgical History:  Procedure Laterality Date  . CHOLECYSTECTOMY    . CYSTECTOMY W/ URETEROILEAL CONDUIT    . TONSILLECTOMY    . WISDOM TOOTH EXTRACTION      Family History  Problem Relation Age of Onset  . Diabetes type II Father   . CAD Father   . Dementia Father   . Stroke Father   . Seizures Father   . Diabetes type II Mother   . Hypertension Mother   . Breast cancer Other   . Diabetes type II Other     Social History:  reports that she has never smoked. She has never used smokeless tobacco. She reports that she does not drink alcohol or use drugs.  Allergies:  Allergies  Allergen Reactions  . Iodine Anaphylaxis  . Latex Anaphylaxis  . Other Anaphylaxis    ALL -MYCINS. Pt states she has had all "-mycins" in all classes and all of them cause reactions.    Marland Kitchen Shellfish-Derived Products Anaphylaxis    Reaction unknown  . Vancomycin Anaphylaxis  . Aspirin Other (See Comments)    Reaction unknown  . Strawberry Extract Hives and Rash    Medications: I have reviewed the patient's current medications.  Results for orders placed or performed  during the hospital encounter of 02/15/16 (from the past 48 hour(s))  Comprehensive metabolic panel     Status: Abnormal   Collection Time: 02/15/16  3:48 PM  Result Value Ref Range   Sodium 137 135 - 145 mmol/L   Potassium 2.9 (L) 3.5 - 5.1 mmol/L   Chloride 98 (L) 101 - 111 mmol/L   CO2 28 22 - 32 mmol/L   Glucose, Bld 92 65 - 99 mg/dL   BUN 9 6 - 20 mg/dL   Creatinine, Ser 0.41 (L) 0.44 - 1.00 mg/dL   Calcium 8.6 (L) 8.9 - 10.3 mg/dL   Total Protein 6.7 6.5 - 8.1 g/dL   Albumin 3.6 3.5 - 5.0 g/dL   AST 10 (L) 15 - 41 U/L   ALT 9 (L) 14 - 54 U/L   Alkaline Phosphatase 93 38 - 126 U/L   Total Bilirubin 1.2 0.3 - 1.2 mg/dL   GFR calc non Af Amer >60 >60 mL/min   GFR calc Af Amer >60 >60 mL/min    Comment: (NOTE) The eGFR has been calculated using the CKD EPI equation. This calculation has not been validated in all clinical situations. eGFR's persistently <60 mL/min signify possible Chronic Kidney Disease.    Anion gap 11 5 -  15  CBC with Differential     Status: Abnormal   Collection Time: 02/15/16  3:48 PM  Result Value Ref Range   WBC 10.7 (H) 4.0 - 10.5 K/uL   RBC 4.52 3.87 - 5.11 MIL/uL   Hemoglobin 11.6 (L) 12.0 - 15.0 g/dL   HCT 37.1 36.0 - 46.0 %   MCV 82.1 78.0 - 100.0 fL   MCH 25.7 (L) 26.0 - 34.0 pg   MCHC 31.3 30.0 - 36.0 g/dL   RDW 16.5 (H) 11.5 - 15.5 %   Platelets 293 150 - 400 K/uL   Neutrophils Relative % 88 %   Neutro Abs 9.5 (H) 1.7 - 7.7 K/uL   Lymphocytes Relative 7 %   Lymphs Abs 0.8 0.7 - 4.0 K/uL   Monocytes Relative 4 %   Monocytes Absolute 0.4 0.1 - 1.0 K/uL   Eosinophils Relative 1 %   Eosinophils Absolute 0.1 0.0 - 0.7 K/uL   Basophils Relative 0 %   Basophils Absolute 0.0 0.0 - 0.1 K/uL  Magnesium     Status: None   Collection Time: 02/15/16  3:48 PM  Result Value Ref Range   Magnesium 2.0 1.7 - 2.4 mg/dL  Culture, blood (routine x 2)     Status: None (Preliminary result)   Collection Time: 02/15/16  3:56 PM  Result Value Ref Range    Specimen Description BLOOD BLOOD RIGHT FOREARM    Special Requests BOTTLES DRAWN AEROBIC AND ANAEROBIC 5 CC EA    Culture      NO GROWTH < 24 HOURS Performed at [facility]    Report Status PENDING   Urinalysis, Routine w reflex microscopic     Status: Abnormal   Collection Time: 02/15/16  3:59 PM  Result Value Ref Range   Color, Urine AMBER (A) YELLOW    Comment: BIOCHEMICALS MAY BE AFFECTED BY COLOR   APPearance TURBID (A) CLEAR   Specific Gravity, Urine 1.019 1.005 - 1.030   pH 8.5 (H) 5.0 - 8.0   Glucose, UA NEGATIVE NEGATIVE mg/dL   Hgb urine dipstick NEGATIVE NEGATIVE   Bilirubin Urine SMALL (A) NEGATIVE   Ketones, ur NEGATIVE NEGATIVE mg/dL   Protein, ur 100 (A) NEGATIVE mg/dL   Nitrite NEGATIVE NEGATIVE   Leukocytes, UA MODERATE (A) NEGATIVE  Urine culture     Status: Abnormal   Collection Time: 02/15/16  3:59 PM  Result Value Ref Range   Specimen Description URINE, RANDOM    Special Requests NONE    Culture MULTIPLE SPECIES PRESENT, SUGGEST RECOLLECTION (A)    Report Status 02/16/2016 FINAL   Urine microscopic-add on     Status: Abnormal   Collection Time: 02/15/16  3:59 PM  Result Value Ref Range   Squamous Epithelial / LPF 0-5 (A) NONE SEEN   WBC, UA 6-30 0 - 5 WBC/hpf   RBC / HPF NONE SEEN 0 - 5 RBC/hpf   Bacteria, UA FEW (A) NONE SEEN   Crystals TRIPLE PHOSPHATE CRYSTALS (A) NEGATIVE   Urine-Other AMORPHOUS URATES/PHOSPHATES   Pregnancy, urine     Status: None   Collection Time: 02/15/16  3:59 PM  Result Value Ref Range   Preg Test, Ur NEGATIVE NEGATIVE    Comment:        THE SENSITIVITY OF THIS METHODOLOGY IS >20 mIU/mL.   Culture, blood (routine x 2)     Status: None (Preliminary result)   Collection Time: 02/15/16  4:10 PM  Result Value Ref Range   Specimen  Description BLOOD LEFT HAND    Special Requests IN PEDIATRIC BOTTLE 2CC    Culture      NO GROWTH < 24 HOURS Performed at [facility]    Report Status PENDING   Lactic acid,  plasma     Status: None   Collection Time: 02/15/16  7:28 PM  Result Value Ref Range   Lactic Acid, Venous 1.0 0.5 - 1.9 mmol/L  C-reactive protein     Status: Abnormal   Collection Time: 02/15/16  7:28 PM  Result Value Ref Range   CRP 20.8 (H) <1.0 mg/dL    Comment: Performed at [facility]  Sedimentation rate     Status: None   Collection Time: 02/15/16  8:35 PM  Result Value Ref Range   Sed Rate 14 0 - 22 mm/hr  Protime-INR     Status: None   Collection Time: 02/15/16  8:35 PM  Result Value Ref Range   Prothrombin Time 14.7 11.4 - 15.2 seconds   INR 1.15   APTT     Status: None   Collection Time: 02/15/16  8:35 PM  Result Value Ref Range   aPTT 36 24 - 36 seconds  Lactic acid, plasma     Status: None   Collection Time: 02/16/16  1:05 AM  Result Value Ref Range   Lactic Acid, Venous 0.7 0.5 - 1.9 mmol/L  Basic metabolic panel     Status: Abnormal   Collection Time: 02/16/16  1:05 AM  Result Value Ref Range   Sodium 136 135 - 145 mmol/L   Potassium 2.7 (LL) 3.5 - 5.1 mmol/L    Comment: CRITICAL RESULT CALLED TO, READ BACK BY AND VERIFIED WITH: Jonette Eva RN @ [phone] ON 02/16/16 BY C DAVIS    Chloride 102 101 - 111 mmol/L   CO2 24 22 - 32 mmol/L   Glucose, Bld 98 65 - 99 mg/dL   BUN 6 6 - 20 mg/dL   Creatinine, Ser 0.37 (L) 0.44 - 1.00 mg/dL   Calcium 8.3 (L) 8.9 - 10.3 mg/dL   GFR calc non Af Amer >60 >60 mL/min   GFR calc Af Amer >60 >60 mL/min    Comment: (NOTE) The eGFR has been calculated using the CKD EPI equation. This calculation has not been validated in all clinical situations. eGFR's persistently <60 mL/min signify possible Chronic Kidney Disease.    Anion gap 10 5 - 15  CBC     Status: Abnormal   Collection Time: 02/16/16  1:05 AM  Result Value Ref Range   WBC 8.6 4.0 - 10.5 K/uL   RBC 3.95 3.87 - 5.11 MIL/uL   Hemoglobin 10.2 (L) 12.0 - 15.0 g/dL   HCT 32.5 (L) 36.0 - 46.0 %   MCV 82.3 78.0 - 100.0 fL   MCH 25.8 (L) 26.0 - 34.0 pg   MCHC  31.4 30.0 - 36.0 g/dL   RDW 16.7 (H) 11.5 - 15.5 %   Platelets 258 150 - 400 K/uL  Prealbumin     Status: Abnormal   Collection Time: 02/16/16 10:48 AM  Result Value Ref Range   Prealbumin 6.1 (L) 18 - 38 mg/dL    Comment: Performed at [facility]    Mr Pelvis W Wo Contrast  Result Date: 02/16/2016 CLINICAL DATA:  Paraplegic patient with a large sacral decubitus ulcer intermittent fevers for the past month. EXAM: MRI PELVIS WITHOUT AND WITH CONTRAST TECHNIQUE: Multiplanar multisequence MR imaging of the pelvis was performed  both before and after administration of intravenous contrast. CONTRAST:  13 ml MULTIHANCE GADOBENATE DIMEGLUMINE 529 MG/ML IV SOLN<Contrast>55m MULTIHANCE GADOBENATE DIMEGLUMINE 529 MG/ML IV SOLN COMPARISON:  CT abdomen and pelvis 09/27/2014. FINDINGS: A large decubitus ulcer is seen along the inferior aspect of the left buttock and left upper thigh. Intense marrow edema and enhancement in the posterior left acetabulum extending into the inferior pubic ramus and ischium is consistent with osteomyelitis. Intense edema and enhancement are seen in the left gluteal, obturator internus, obturator externus,, adductor magnus and brevis, pectineus, hamstring and vastus intermedius and lateralis muscles consistent with myositis. No abscess is identified. There is no hip joint effusion. No muscle tear is identified. All imaged musculature is atrophic. IMPRESSION: Findings consistent with osteomyelitis in the posterior left acetabulum, ischial tuberosity and inferior pubic ramus. Myositis of extensive musculature of the left pelvis and upper leg. No abscess is identified. Negative for septic joint. Electronically Signed   By: TInge RiseM.D.   On: 02/16/2016 09:38   Dg Chest Port 1 View  Result Date: 02/15/2016 CLINICAL DATA:  Intermittent fever and nausea. EXAM: PORTABLE CHEST 1 VIEW COMPARISON:  01/29/2016 FINDINGS: Both lungs are clear. Heart and mediastinum are within  normal limits. The trachea is midline. Negative for a pneumothorax. No acute bone abnormality. IMPRESSION: No active disease. Electronically Signed   By: AMarkus DaftM.D.   On: 02/15/2016 16:34    Review of Systems  Eyes: Negative.   Respiratory: Negative.   Cardiovascular: Negative.   Gastrointestinal: Negative.   Genitourinary: Negative.   Musculoskeletal: Negative.   Neurological: Negative.   Psychiatric/Behavioral: Negative.    Blood pressure 99/62, pulse 92, temperature 98.4 F (36.9 C), temperature source Oral, resp. rate 16, last menstrual period 01/15/2016, SpO2 100 %. Physical Exam  Constitutional: She is oriented to person, place, and time. She appears well-developed and well-nourished.  HENT:  Head: Normocephalic and atraumatic.  Eyes: EOM are normal. Pupils are equal, round, and reactive to light.  Cardiovascular: Normal rate.   Respiratory: Effort normal. No respiratory distress.  Musculoskeletal: She exhibits no edema.  Neurological: She is alert and oriented to person, place, and time.  Skin: Skin is warm.  Psychiatric: She has a normal mood and affect. Her behavior is normal. Judgment and thought content normal.    Assessment/Plan: Patient is not ready for a flap.  MRI consistent with osteomyelitis of left acetabulum and ischial tuberosity and inferior pubic ramus.  This is extensive involvement.  Continue Dakins solution wet to dry 2-3 times a day.  Recommend multivitamin, Vit C and Zinc as able.  Increase protein. Continue air mattress bed and turn patient every 2 hours with no pressure to the area.  Recommend obtaining the bone cultures that were obtained at the wound care center.  The results are not on the computer but available through the center.  If additional cultures needed, consider IR as pubic ramus and acetabulum will not be easily accessible surgically.  If only debridement needed then recommend Gen Surg.  We remain available when patient is ready for  reconstruction.  CWallace Going11/16/2017, 4:06 PM

## 2016-02-16 NOTE — Progress Notes (Signed)
PROGRESS NOTE  [name]  EML:[id number] DOB: 1980-11-14 DOA: 02/15/2016 PCP: Simona Huh, MD  Brief Narrative:    [name] is a 35 y.o. 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  She has a stage 4 sacral ulcer which is followed by the wound care clinic by Dr. Dellia Nims and she has a chronic wound vac.  For the last month, she has had intermittent fevers which have been temporarily suppressed by antibiotics such as keflex.  She was also diagnosed with possible viral illness.  Over the last three days, her fevers returned but have been more consistent and higher, up to 101.87F with associated nausea, chills, sweats.  Her home health RN noticed increased erythema and purulence from her chronic sacral wound.  The [facility] RN contacted Dr. Dellia Nims who recommended admission to the hospital for assessment and treatment.  In the ER, VS notable for T = 101.54F, tachycardic to the low 100s.  WBC 10.7.  MRI of the sacrum demonstrated osteomyelitis of the posterior left acetabulum, ischial tuberosity, and inferior pubic ramus with myositis of the the left pelvis and upper leg.  No discrete abscess.    Assessment & Plan:   Active Problems:   Infected decubitus ulcer  Osteomyelitis of the posterior left acetabulum, ischial tuberosity, and inferior pubic ramus with myositis of the the left pelvis and upper leg.  Initially concerned for sepsis so started antibiotics, but tried to use empiric antibiotics for sepsis that would have less good bone penetration in case biopsy needed to be performed.  Fevers improved.  Lactic acid was eventually normal and BP remained stable.  D/c doxycycline, zosyn in anticipation of possible bone biopsy.   -  ESR 14, CRP 20.8 -  Check CK -  Blood culture pending -  Wet to  dry dressings -  Wound care consultation for possible bone biopsy, spoke with Dr. Iran Planas. -  Once biopsy complete, consider starting ceftaz or levofloxacin pending culture data  Hypokalemia worsening despite supplementation -  Continue IV potassium -  Additional oral and IV potassium -  Repeat K in AM  Nausea, vomiting, and diarrhea, could be related to underlying infection.  Due to multiple courses of antibiotics, if stools are watery and copious, would send for C. Diff PCR.  Currently does not meet threshold for testing. -  IVF, antiemetics  Paraplegia due to history of transverse myelitis  Normocytic anemia, likely due to inflammation from osteomyelitis -  No need for blood transfusion  DVT prophylaxis:  lovenox  Code Status: full Family Communication: patient and her mother  Disposition Plan:  Likely to home in care of mother   Consultants:   Wound care, Dr. Iran Planas  Procedures:  none  Antimicrobials:   Vancomycin and doxycycline 11/15 - 11/16 (on hold for possible biopsy)   Subjective:  Feeling flushed/hot then cold.  Feels tired today.  Ongoing frequent stools.    Objective: Vitals:   02/15/16 1800 02/15/16 1900 02/15/16 1921 02/16/16 0511  BP: 100/84 114/68 123/65 102/68  Pulse: 111 111 (!) 115 99  Resp:  _0 Temp:  98.7 F (37.1 C) 98.3 F (36.8 C) 98.5 F (36.9 C)  TempSrc:  Oral Oral Oral  SpO2:  100% 100% 99%    Intake/Output Summary (Last 24 hours) at  02/16/16 1520 Last data filed at 02/16/16 1036  Gross per 24 hour  Intake           971.25 ml  Output              700 ml  Net           271.25 ml   There were no vitals filed for this visit.  Examination:  General exam:  Adult female.  No acute distress.  HEENT:  NCAT, MMM Respiratory system: Clear to auscultation bilaterally Cardiovascular system: tachycardic, regular rhythm.  No murmurs, rubs, gallops or clicks.  Warm extremities Gastrointestinal system: Normal active bowel  sounds, soft, nondistended, nontender.  Urostomy with dark urine MSK:  Flaccid legs, foot contractures.  Trace bilateral lower extremity edema.  Clubbed digits Neuro:  Paraplegic from waist down    Data Reviewed: I have personally reviewed following labs and imaging studies  CBC:  Recent Labs Lab 02/15/16 1548 02/16/16 0105  WBC 10.7* 8.6  NEUTROABS 9.5*  --   HGB 11.6* 10.2*  HCT 37.1 32.5*  MCV 82.1 82.3  PLT 293 606   Basic Metabolic Panel:  Recent Labs Lab 02/15/16 1548 02/16/16 0105  NA 137 136  K 2.9* 2.7*  CL 98* 102  CO2 28 24  GLUCOSE 92 98  BUN 9 6  CREATININE 0.41* 0.37*  CALCIUM 8.6* 8.3*  MG 2.0  --    GFR: CrCl cannot be calculated (Unknown ideal weight.). Liver Function Tests:  Recent Labs Lab 02/15/16 1548  AST 10*  ALT 9*  ALKPHOS 93  BILITOT 1.2  PROT 6.7  ALBUMIN 3.6   No results for input(s): LIPASE, AMYLASE in the last 168 hours. No results for input(s): AMMONIA in the last 168 hours. Coagulation Profile:  Recent Labs Lab 02/15/16 2035  INR 1.15   Cardiac Enzymes: No results for input(s): CKTOTAL, CKMB, CKMBINDEX, TROPONINI in the last 168 hours. BNP (last 3 results) No results for input(s): PROBNP in the last 8760 hours. HbA1C: No results for input(s): HGBA1C in the last 72 hours. CBG: No results for input(s): GLUCAP in the last 168 hours. Lipid Profile: No results for input(s): CHOL, HDL, LDLCALC, TRIG, CHOLHDL, LDLDIRECT in the last 72 hours. Thyroid Function Tests: No results for input(s): TSH, T4TOTAL, FREET4, T3FREE, THYROIDAB in the last 72 hours. Anemia Panel: No results for input(s): VITAMINB12, FOLATE, FERRITIN, TIBC, IRON, RETICCTPCT in the last 72 hours. Urine analysis:    Component Value Date/Time   COLORURINE AMBER (A) 02/15/2016 1559   APPEARANCEUR TURBID (A) 02/15/2016 1559   LABSPEC 1.019 02/15/2016 1559   PHURINE 8.5 (H) 02/15/2016 1559   GLUCOSEU NEGATIVE 02/15/2016 1559   HGBUR NEGATIVE  02/15/2016 1559   BILIRUBINUR SMALL (A) 02/15/2016 1559   KETONESUR NEGATIVE 02/15/2016 1559   PROTEINUR 100 (A) 02/15/2016 1559   UROBILINOGEN 2.0 (H) 10/29/2014 1627   NITRITE NEGATIVE 02/15/2016 1559   LEUKOCYTESUR MODERATE (A) 02/15/2016 1559   Sepsis Labs: _0 (procalcitonin:4,lacticidven:4)  ) Recent Results (from the past 240 hour(s))  Urine culture     Status: Abnormal   Collection Time: 02/15/16  3:59 PM  Result Value Ref Range Status   Specimen Description URINE, RANDOM  Final   Special Requests NONE  Final   Culture MULTIPLE SPECIES PRESENT, SUGGEST RECOLLECTION (A)  Final   Report Status 02/16/2016 FINAL  Final      Radiology Studies: Mr Pelvis W Wo Contrast  Result Date: 02/16/2016 CLINICAL DATA:  Paraplegic patient with a  large sacral decubitus ulcer intermittent fevers for the past month. EXAM: MRI PELVIS WITHOUT AND WITH CONTRAST TECHNIQUE: Multiplanar multisequence MR imaging of the pelvis was performed both before and after administration of intravenous contrast. CONTRAST:  13 ml MULTIHANCE GADOBENATE DIMEGLUMINE 529 MG/ML IV SOLN<Contrast>29m MULTIHANCE GADOBENATE DIMEGLUMINE 529 MG/ML IV SOLN COMPARISON:  CT abdomen and pelvis 09/27/2014. FINDINGS: A large decubitus ulcer is seen along the inferior aspect of the left buttock and left upper thigh. Intense marrow edema and enhancement in the posterior left acetabulum extending into the inferior pubic ramus and ischium is consistent with osteomyelitis. Intense edema and enhancement are seen in the left gluteal, obturator internus, obturator externus,, adductor magnus and brevis, pectineus, hamstring and vastus intermedius and lateralis muscles consistent with myositis. No abscess is identified. There is no hip joint effusion. No muscle tear is identified. All imaged musculature is atrophic. IMPRESSION: Findings consistent with osteomyelitis in the posterior left acetabulum, ischial tuberosity and inferior pubic  ramus. Myositis of extensive musculature of the left pelvis and upper leg. No abscess is identified. Negative for septic joint. Electronically Signed   By: TInge RiseM.D.   On: 02/16/2016 09:38   Dg Chest Port 1 View  Result Date: 02/15/2016 CLINICAL DATA:  Intermittent fever and nausea. EXAM: PORTABLE CHEST 1 VIEW COMPARISON:  01/29/2016 FINDINGS: Both lungs are clear. Heart and mediastinum are within normal limits. The trachea is midline. Negative for a pneumothorax. No acute bone abnormality. IMPRESSION: No active disease. Electronically Signed   By: AMarkus DaftM.D.   On: 02/15/2016 16:34     Scheduled Meds: . baclofen  20 mg Oral TID  . diazepam  10 mg Oral QHS  . diazepam  5 mg Oral 2 times per day  . doxycycline (VIBRAMYCIN) IV  100 mg Intravenous Q12H  . enoxaparin (LOVENOX) injection  40 mg Subcutaneous QHS  . gabapentin  600 mg Oral TID  . lamoTRIgine  125 mg Oral Daily  . pantoprazole  40 mg Oral Daily  . piperacillin-tazobactam (ZOSYN)  IV  3.375 g Intravenous Q8H  . sertraline  100 mg Oral Daily  . sodium hypochlorite   Irrigation BID   Continuous Infusions: . dextrose 5 % and 0.45 % NaCl with KCl 40 mEq/L 75 mL/hr at 02/16/16 1036     LOS: 1 day    Time spent: 30 min    SJanece Canterbury MD [facility] Pager [phone] If 7PM-7AM, please contact night-coverage [email or web address] Password [email or web address] 02/16/2016, 3:20 PM

## 2016-02-17 DIAGNOSIS — B9689 Other specified bacterial agents as the cause of diseases classified elsewhere: Secondary | ICD-10-CM

## 2016-02-17 DIAGNOSIS — Z886 Allergy status to analgesic agent status: Secondary | ICD-10-CM

## 2016-02-17 DIAGNOSIS — M8618 Other acute osteomyelitis, other site: Secondary | ICD-10-CM

## 2016-02-17 DIAGNOSIS — G8220 Paraplegia, unspecified: Secondary | ICD-10-CM

## 2016-02-17 DIAGNOSIS — Z91013 Allergy to seafood: Secondary | ICD-10-CM

## 2016-02-17 DIAGNOSIS — Z91040 Latex allergy status: Secondary | ICD-10-CM

## 2016-02-17 DIAGNOSIS — L89894 Pressure ulcer of other site, stage 4: Secondary | ICD-10-CM

## 2016-02-17 DIAGNOSIS — Z888 Allergy status to other drugs, medicaments and biological substances status: Secondary | ICD-10-CM

## 2016-02-17 DIAGNOSIS — Z881 Allergy status to other antibiotic agents status: Secondary | ICD-10-CM

## 2016-02-17 DIAGNOSIS — G373 Acute transverse myelitis in demyelinating disease of central nervous system: Secondary | ICD-10-CM

## 2016-02-17 DIAGNOSIS — Z91018 Allergy to other foods: Secondary | ICD-10-CM

## 2016-02-17 LAB — BASIC METABOLIC PANEL
ANION GAP: 6 (ref 5–15)
BUN: 5 mg/dL — ABNORMAL LOW (ref 6–20)
CHLORIDE: 108 mmol/L (ref 101–111)
CO2: 27 mmol/L (ref 22–32)
Calcium: 8.1 mg/dL — ABNORMAL LOW (ref 8.9–10.3)
Creatinine, Ser: 0.3 mg/dL — ABNORMAL LOW (ref 0.44–1.00)
GFR calc non Af Amer: >60 mL/min (ref >60)
Glucose, Bld: 106 mg/dL — ABNORMAL HIGH (ref 65–99)
POTASSIUM: 4.3 mmol/L (ref 3.5–5.1)
SODIUM: 141 mmol/L (ref 135–145)

## 2016-02-17 LAB — CK: Total CK: 15 U/L — ABNORMAL LOW (ref 38–234)

## 2016-02-17 MED ORDER — GABAPENTIN 300 MG PO CAPS
600.0000 mg | ORAL_CAPSULE | Freq: Two times a day (BID) | ORAL | Status: DC
  Administered 2016-02-17 – 2016-02-20 (×7): 600 mg via ORAL
  Filled 2016-02-17 (×7): qty 2

## 2016-02-17 MED ORDER — GABAPENTIN 300 MG PO CAPS
300.0000 mg | ORAL_CAPSULE | Freq: Every day | ORAL | Status: DC
  Administered 2016-02-18 – 2016-02-21 (×4): 300 mg via ORAL
  Filled 2016-02-17 (×4): qty 1

## 2016-02-17 NOTE — Progress Notes (Signed)
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 She has a stage 4 sacral ulcer which is followed by the wound care clinic by Dr. Dellia Nims and she has a chronic wound vac. For the last month, she has had intermittent fevers which have been temporarily suppressed by antibiotics such as keflex. She was also diagnosed with possible viral illness. Over the last three days, her fevers returned but have been more consistent and higher, up to 101.6F with associated nausea, chills, sweats. Her home health RN noticed increased erythema and purulence from her chronic sacral wound. MRI of the sacrum demonstrated osteomyelitis of the posterior left acetabulum, ischial tuberosity, and inferior pubic ramus with myositis of the the left pelvis and upper leg. She has been seen in 2005 by our service for right PCN placement secondary to stone disease. She also has hx of recurrent UTI's. IR now consulted for left ischial tuberosity bone biopsy at ulcer site. Patient complains of uncontrollable leg spasms. Patient currently denies fever, headaches, chest pain, shortness of breath, abdominal pain, back pain, nausea, vomiting, or bleeding. Past Medical History:  Diagnosis Date  . Bladder stones    Hx of  . Depression   . GERD (gastroesophageal reflux disease)   . Multiple sclerosis ([place])   . Paraplegia ([place])   . PTSD (post-traumatic stress disorder)   . Transverse myelitis [facility])    Past Surgical History:  Procedure Laterality Date  . CHOLECYSTECTOMY    . CYSTECTOMY W/ URETEROILEAL CONDUIT    . TONSILLECTOMY      . WISDOM TOOTH EXTRACTION       Allergies: Iodine; Latex; Other; Shellfish-derived products; Vancomycin; Aspirin; and Strawberry extract  Medications: Prior to Admission medications   Medication Sig Start Date End Date Taking? Authorizing Provider  acetaminophen (TYLENOL) 325 MG tablet Take 650 mg by mouth every 6 (six) hours as needed for fever.   Yes Historical Provider, MD  baclofen (LIORESAL) 20 MG tablet Take 20 mg by mouth 3 (three) times daily.   Yes Historical Provider, MD  bisacodyl (DULCOLAX) 10 MG suppository Place 1 suppository (10 mg total) rectally daily as needed for moderate constipation. 10/31/14  Yes Nishant Dhungel, MD  diazepam (VALIUM) 5 MG tablet Take 1-2 tablets (5-10 mg total) by mouth every 8 (eight) hours. Take 1 tablet in AM, 1 tablet in PM, and 2 tablets at bedtime Patient taking differently: Take 5-10 mg by mouth 3 (three) times daily. Take 1 tablet in AM, 1 tablet in PM, and 2 tablets at bedtime 04/02/13  Yes Robbie Lis, MD  fluticasone [facility]) 50 MCG/ACT nasal spray Place 2 sprays into the nose daily as needed for allergies.    Yes Historical Provider, MD  gabapentin (NEURONTIN) 300 MG capsule Take 2 capsules (600 mg total) by mouth 3 (three) times daily. 01/13/16  Yes Adam Telford Nab, DO  ibuprofen (ADVIL,MOTRIN) 200 MG tablet Take 400 mg by mouth every 6 (six) hours as needed for headache, mild pain or moderate pain.    Yes Historical Provider, MD  lamoTRIgine (LAMICTAL) 25 MG tablet Take 125 mg by mouth daily.   Yes Historical Provider, MD  loratadine (CLARITIN)  5 MG/5ML syrup Take 5 mg by mouth daily.   Yes Historical Provider, MD  lubiprostone (AMITIZA) 24 MCG capsule Take 24 mcg by mouth 2 (two) times daily with a meal.   Yes Historical Provider, MD  naproxen sodium (ANAPROX) 220 MG tablet Take 220-440 mg by mouth 3 (three) times daily as needed (migraine/headache).   Yes Historical Provider, MD  omeprazole (PRILOSEC) 40 MG capsule Take 40 mg by mouth  daily.   Yes Historical Provider, MD  ondansetron (ZOFRAN ODT) 4 MG disintegrating tablet 4mg  ODT q4 hours prn nausea/vomit 01/29/16  Yes Deno Etienne, DO  senna-docusate (SENOKOT-S) 8.6-50 MG per tablet Take 1 tablet by mouth 2 (two) times daily. Patient taking differently: Take 1 tablet by mouth 2 (two) times daily as needed for mild constipation or moderate constipation.  10/31/14  Yes Nishant Dhungel, MD  sertraline (ZOLOFT) 100 MG tablet Take 100 mg by mouth daily.   Yes Historical Provider, MD     Vital Signs: BP 109/71 (BP Location: Right Arm)   Pulse 90   Temp 97.5 F (36.4 C) (Oral)   Resp 18   LMP 01/15/2016   SpO2 100%   Physical Exam Awake, alert, and oriented. Resp: Clear to auscultation bilaterally CV: RRR GI: Normal active bowel sounds, soft, nondistended, nontender.  Urostomy intact MSK: Flaccid legs, foot contractures. Trace bilateral lower extremity edema. Clubbed digits Neuro: Paraplegic from waist down SKIN: Ulcer on left lower buttock. Packed, wet to dry dressing. No leakage. Small amount of surrounding erythema.   Imaging: Mr Pelvis W Wo Contrast  Result Date: 02/16/2016 CLINICAL DATA:  Paraplegic patient with a large sacral decubitus ulcer intermittent fevers for the past month. EXAM: MRI PELVIS WITHOUT AND WITH CONTRAST TECHNIQUE: Multiplanar multisequence MR imaging of the pelvis was performed both before and after administration of intravenous contrast. CONTRAST:  13 ml MULTIHANCE GADOBENATE DIMEGLUMINE 529 MG/ML IV SOLN<Contrast>57mL MULTIHANCE GADOBENATE DIMEGLUMINE 529 MG/ML IV SOLN COMPARISON:  CT abdomen and pelvis 09/27/2014. FINDINGS: A large decubitus ulcer is seen along the inferior aspect of the left buttock and left upper thigh. Intense marrow edema and enhancement in the posterior left acetabulum extending into the inferior pubic ramus and ischium is consistent with osteomyelitis. Intense edema and enhancement are seen in the left gluteal, obturator  internus, obturator externus,, adductor magnus and brevis, pectineus, hamstring and vastus intermedius and lateralis muscles consistent with myositis. No abscess is identified. There is no hip joint effusion. No muscle tear is identified. All imaged musculature is atrophic. IMPRESSION: Findings consistent with osteomyelitis in the posterior left acetabulum, ischial tuberosity and inferior pubic ramus. Myositis of extensive musculature of the left pelvis and upper leg. No abscess is identified. Negative for septic joint. Electronically Signed   By: Inge Rise M.D.   On: 02/16/2016 09:38   Dg Chest Port 1 View  Result Date: 02/15/2016 CLINICAL DATA:  Intermittent fever and nausea. EXAM: PORTABLE CHEST 1 VIEW COMPARISON:  01/29/2016 FINDINGS: Both lungs are clear. Heart and mediastinum are within normal limits. The trachea is midline. Negative for a pneumothorax. No acute bone abnormality. IMPRESSION: No active disease. Electronically Signed   By: Markus Daft M.D.   On: 02/15/2016 16:34    Labs:  CBC:  Recent Labs  01/09/16 0327 01/29/16 1419 02/15/16 1548 02/16/16 0105  WBC 5.7 11.5* 10.7* 8.6  HGB 9.9* 12.1 11.6* 10.2*  HCT 30.5* 37.9 37.1 32.5*  PLT 210 237 293 258    COAGS:  Recent Labs  02/15/16 2035  INR 1.15  APTT 36    BMP:  Recent Labs  01/29/16 1419 02/15/16 1548 02/16/16 0105 02/17/16 0506  NA 136 137 136 141  K 3.9 2.9* 2.7* 4.3  CL 101 98* 102 108  CO2 27 28 24 27   GLUCOSE 97 92 98 106*  BUN 10 9 6  5*  CALCIUM 8.7* 8.6* 8.3* 8.1*  CREATININE 0.44 0.41* 0.37* 0.30*  GFRNONAA >60 >60 >60 >60  GFRAA >60 >60 >60 >60    LIVER FUNCTION TESTS:  Recent Labs  01/07/16 0416 01/08/16 0424 01/29/16 1419 02/15/16 1548  BILITOT 0.7 0.9 0.9 1.2  AST 14* 10* 13* 10*  ALT 9* 7* 12* 9*  ALKPHOS 108 72 120 93  PROT 6.9 5.1* 6.9 6.7  ALBUMIN 3.7 2.7* 3.7 3.6    Assessment and Plan: Veronica E Myersis a 35 y.o.femalewith history of transverse  myelitis when she was 34 years old which resulted in paraplegia, urinary and stool incontinence.She has a 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 Dellia Nims and she has a chronic wound vac.For the last month, she has had intermittent fevers which have been temporarily suppressed by antibiotics such as keflex.She was also diagnosed with possible viral illness.Over the last three days, her fevers returned but have been more consistent and higher, up to 101.45F with associated nausea, chills, sweats. Her home health RN noticed increased erythema and purulence from her chronic sacral wound. MRI of the sacrum demonstrated osteomyelitis of the posterior left acetabulum, ischial tuberosity, and inferior pubic ramus with myositis of the the left pelvis and upper leg. IR was consulted for left ischial tuberosity bone biopsy at ulcer site. Previous sacral wound culture from 01/05/16 revealed multiple organisms with none predominant. There was no staph or strep isolated. Imaging studies have been reviewed by Dr. Annamaria Boots. Infectious disease has also been consulted. Plan at this time is for pt  to be off antibiotics for a few days prior to CT-guided biopsy of the left ischial tuberosity for culture.  Risks and benefits discussed with the patient/mother including, but not limited to bleeding, infection, damage to adjacent structures or low yield requiring additional tests. All of the patient's questions were answered, patient is agreeable to proceed. Consent signed and in chart. Procedure tent scheduled  for Monday 02/20/2016.   Electronically Signed: D. Rowe Robert 02/17/2016, 3:49 PM   I spent a total of 25 minutes at the the patient's bedside AND on the patient's hospital floor or unit, greater than 50% of which was counseling/coordinating care for CT-guided aspiration/biopsy of  left ischial tuberosity    Patient ID: [name], female   DOB: 08-Jun-1980, 35 y.o.   MRN: [id number]

## 2016-02-17 NOTE — Progress Notes (Signed)
      INFECTIOUS DISEASE ATTENDING ADDENDUM:   Date: 02/17/2016  Patient name: [name]  Medical record number: [id number]  Date of birth: [birth year]   I have been asked to see this patient with regards to treatment of her pelvic osteomyelitis.  To provide targeted therapy cultures from bone would be most helpful.  To maximize yield on such cultures are preferred for her to be off antibiotics for at least 48 hours.  Therefore be best not to do a biopsy today but perhaps later this weekend or even Monday provided the patient remains clinically stable.  I will see her as a formal consult later today.    Rhina Brackett Dam 02/17/2016, 10:12 AM

## 2016-02-17 NOTE — Progress Notes (Addendum)
PROGRESS NOTE  [name]  SHU:[id number] DOB: 21-Sep-1980 DOA: 02/15/2016 PCP: Simona Huh, MD  Brief Narrative:    [name] is a 35 y.o. 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  She has a stage 4 sacral ulcer which is followed by the wound care clinic by Dr. Dellia Nims and she has a chronic wound vac.  For the last month, she has had intermittent fevers which have been temporarily suppressed by antibiotics such as keflex.  She was also diagnosed with possible viral illness.  Over the last three days, her fevers returned but have been more consistent and higher, up to 101.1F with associated nausea, chills, sweats.  Her home health RN noticed increased erythema and purulence from her chronic sacral wound.  The [facility] RN contacted Dr. Dellia Nims who recommended admission to the hospital for assessment and treatment.  In the ER, VS notable for T = 101.45F, tachycardic to the low 100s.  WBC 10.7.  MRI of the sacrum demonstrated osteomyelitis of the posterior left acetabulum, ischial tuberosity, and inferior pubic ramus with myositis of the the left pelvis and upper leg.  No discrete abscess.    Assessment & Plan:   Active Problems:   Paraplegia (HCC)   Hypokalemia   Infected decubitus ulcer   Osteomyelitis of pelvis (HCC)   Myositis  Osteomyelitis of the posterior left acetabulum, ischial tuberosity, and inferior pubic ramus with myositis of the the left pelvis and upper leg.  Initially concerned for sepsis so started antibiotics, but tried to use empiric antibiotics for sepsis that would have less good bone penetration in case biopsy needed to be performed.  Fevers improved.  Lactic acid was eventually normal and BP remained stable.  Sepsis ruled out.  D/c doxycycline, zosyn in anticipation  of possible bone biopsy.   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 Dellia Nims. -  Continue to hold off on antibiotics -  IR consult placed.  Bone biopsy over weekend or Monday -  Appreciate ID assistance -  Zinc, vit C, MVI ordered by plastic surgery  Hypokalemia resolved with oral and IV supplementation -  Continue IV potassium -  Repeat K in AM  Nausea, vomiting, and diarrhea, could be related to underlying infection.  Due to multiple courses of antibiotics, if stools are watery and copious, would send for C. Diff PCR.  Currently does not meet threshold for testing. -  IVF, antiemetics  Paraplegia due to history of transverse myelitis  Normocytic anemia, likely due to inflammation from osteomyelitis -  No need for blood transfusion  Sleepy/somnolent -  Decrease gabapentin   Muscle spasms -  Continue valium and baclofen, but consider decreasing if remaining sedate  DVT prophylaxis:  lovenox  Code Status: full Family Communication: patient and her mother  Disposition Plan:  Likely to home in care of mother   Consultants:   Wound care, Dr. Iran Planas  Procedures:  none  Antimicrobials:   Vancomycin and doxycycline 11/15 - 11/16 (on hold for possible biopsy)   Subjective:  Feels sleepy and tired today.  Ongoing frequent stools.    Objective: Vitals:   02/16/16 2152 02/17/16 0636 02/17/16 0923 02/17/16 1348  BP: (!) 94/58 (!) 99/59 (!) 100/56 109/71  Pulse: 90 92 94 90  Resp: 16 16 18 18   Temp: 98.5 F (36.9 C) 97.5 F (36.4 C) 97.6 F (36.4 C) 97.5 F (36.4 C)  TempSrc: Oral Oral Oral Oral  SpO2: 100% 100% 100% 100%    Intake/Output Summary (Last 24 hours) at 02/17/16 1528 Last data filed at 02/17/16 1417  Gross per 24 hour  Intake          3049.58 ml  Output             2600 ml  Net           449.58 ml   There were no vitals filed for this  visit.  Examination:  General exam:  Adult female.  No acute distress.  HEENT:  NCAT, MMM Respiratory system: Clear to auscultation bilaterally Cardiovascular system:  RRR.  No murmurs, rubs, gallops or clicks.  Warm extremities Gastrointestinal system: Normal active bowel sounds, soft, nondistended, nontender.  Urostomy with dark urine MSK:  Flaccid legs, foot contractures.  Trace bilateral lower extremity edema.  Clubbed digits Neuro:  Paraplegic from waist down Ulcer on left lower buttock:  Packed today, but less pink and indurated around margins compared to admission date    Data Reviewed: I have personally reviewed following labs and imaging studies  CBC:  Recent Labs Lab 02/15/16 1548 02/16/16 0105  WBC 10.7* 8.6  NEUTROABS 9.5*  --   HGB 11.6* 10.2*  HCT 37.1 32.5*  MCV 82.1 82.3  PLT 293 161   Basic Metabolic Panel:  Recent Labs Lab 02/15/16 1548 02/16/16 0105 02/17/16 0506  NA 137 136 141  K 2.9* 2.7* 4.3  CL 98* 102 108  CO2 28 24 27   GLUCOSE 92 98 106*  BUN 9 6 5*  CREATININE 0.41* 0.37* 0.30*  CALCIUM 8.6* 8.3* 8.1*  MG 2.0  --   --    GFR: CrCl cannot be calculated (Unknown ideal weight.). Liver Function Tests:  Recent Labs Lab 02/15/16 1548  AST 10*  ALT 9*  ALKPHOS 93  BILITOT 1.2  PROT 6.7  ALBUMIN 3.6   No results for input(s): LIPASE, AMYLASE in the last 168 hours. No results for input(s): AMMONIA in the last 168 hours. Coagulation Profile:  Recent Labs Lab 02/15/16 2035  INR 1.15   Cardiac Enzymes:  Recent Labs Lab 02/17/16 0506  CKTOTAL 15*   BNP (last 3 results) No results for input(s): PROBNP in the last 8760 hours. HbA1C: No results for input(s): HGBA1C in the last 72 hours. CBG: No results for input(s): GLUCAP in the last 168 hours. Lipid Profile: No results for input(s): CHOL, HDL, LDLCALC, TRIG, CHOLHDL, LDLDIRECT in the last 72 hours. Thyroid Function Tests: No results for input(s): TSH, T4TOTAL, FREET4,  T3FREE, THYROIDAB in the last 72 hours. Anemia Panel: No results for input(s): VITAMINB12, FOLATE, FERRITIN, TIBC, IRON, RETICCTPCT in the last 72 hours. Urine analysis:    Component Value Date/Time   COLORURINE AMBER (A) 02/15/2016 1559   APPEARANCEUR TURBID (A) 02/15/2016 1559   LABSPEC 1.019 02/15/2016 1559   PHURINE 8.5 (H) 02/15/2016 1559   GLUCOSEU NEGATIVE 02/15/2016 1559   HGBUR NEGATIVE 02/15/2016 1559   BILIRUBINUR SMALL (A) 02/15/2016 1559   KETONESUR NEGATIVE 02/15/2016 1559   PROTEINUR 100 (A) 02/15/2016 1559   UROBILINOGEN 2.0 (H) 10/29/2014 1627   NITRITE NEGATIVE 02/15/2016 1559   LEUKOCYTESUR MODERATE (A) 02/15/2016 1559   Sepsis Labs: @LABRCNTIP (procalcitonin:4,lacticidven:4)  ) Recent  Results (from the past 240 hour(s))  Culture, blood (routine x 2)     Status: None (Preliminary result)   Collection Time: 02/15/16  3:56 PM  Result Value Ref Range Status   Specimen Description BLOOD BLOOD RIGHT FOREARM  Final   Special Requests BOTTLES DRAWN AEROBIC AND ANAEROBIC 5 CC EA  Final   Culture   Final    NO GROWTH < 24 HOURS Performed at [facility]    Report Status PENDING  Incomplete  Urine culture     Status: Abnormal   Collection Time: 02/15/16  3:59 PM  Result Value Ref Range Status   Specimen Description URINE, RANDOM  Final   Special Requests NONE  Final   Culture MULTIPLE SPECIES PRESENT, SUGGEST RECOLLECTION (A)  Final   Report Status 02/16/2016 FINAL  Final  Culture, blood (routine x 2)     Status: None (Preliminary result)   Collection Time: 02/15/16  4:10 PM  Result Value Ref Range Status   Specimen Description BLOOD LEFT HAND  Final   Special Requests IN PEDIATRIC BOTTLE [place]  Final   Culture   Final    NO GROWTH < 24 HOURS Performed at [facility]    Report Status PENDING  Incomplete      Radiology Studies: Mr Pelvis W Wo Contrast  Result Date: 02/16/2016 CLINICAL DATA:  Paraplegic patient with a large sacral decubitus  ulcer intermittent fevers for the past month. EXAM: MRI PELVIS WITHOUT AND WITH CONTRAST TECHNIQUE: Multiplanar multisequence MR imaging of the pelvis was performed both before and after administration of intravenous contrast. CONTRAST:  13 ml MULTIHANCE GADOBENATE DIMEGLUMINE 529 MG/ML IV SOLN<Contrast>77m MULTIHANCE GADOBENATE DIMEGLUMINE 529 MG/ML IV SOLN COMPARISON:  CT abdomen and pelvis 09/27/2014. FINDINGS: A large decubitus ulcer is seen along the inferior aspect of the left buttock and left upper thigh. Intense marrow edema and enhancement in the posterior left acetabulum extending into the inferior pubic ramus and ischium is consistent with osteomyelitis. Intense edema and enhancement are seen in the left gluteal, obturator internus, obturator externus,, adductor magnus and brevis, pectineus, hamstring and vastus intermedius and lateralis muscles consistent with myositis. No abscess is identified. There is no hip joint effusion. No muscle tear is identified. All imaged musculature is atrophic. IMPRESSION: Findings consistent with osteomyelitis in the posterior left acetabulum, ischial tuberosity and inferior pubic ramus. Myositis of extensive musculature of the left pelvis and upper leg. No abscess is identified. Negative for septic joint. Electronically Signed   By: TInge RiseM.D.   On: 02/16/2016 09:38   Dg Chest Port 1 View  Result Date: 02/15/2016 CLINICAL DATA:  Intermittent fever and nausea. EXAM: PORTABLE CHEST 1 VIEW COMPARISON:  01/29/2016 FINDINGS: Both lungs are clear. Heart and mediastinum are within normal limits. The trachea is midline. Negative for a pneumothorax. No acute bone abnormality. IMPRESSION: No active disease. Electronically Signed   By: AMarkus DaftM.D.   On: 02/15/2016 16:34     Scheduled Meds: . baclofen  20 mg Oral TID  . diazepam  10 mg Oral QHS  . diazepam  5 mg Oral 2 times per day  . enoxaparin (LOVENOX) injection  40 mg Subcutaneous QHS  . gabapentin   600 mg Oral TID  . lamoTRIgine  125 mg Oral Daily  . pantoprazole  40 mg Oral Daily  . sertraline  100 mg Oral Daily  . sodium hypochlorite   Irrigation BID   Continuous Infusions:    LOS: 2 days  Time spent: 30 min    [name], MD [facility] Pager [phone]  If 7PM-7AM, please contact night-coverage [email or web address] Password [email or web address] 02/17/2016, 3:28 PM
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
## 2016-02-18 LAB — HEPATITIS C ANTIBODY (REFLEX)

## 2016-02-18 LAB — HCV COMMENT:

## 2016-02-18 LAB — C-REACTIVE PROTEIN: CRP: 2.6 mg/dL — ABNORMAL HIGH (ref <1.0)

## 2016-02-18 LAB — SEDIMENTATION RATE: Sed Rate: 10 mm/hr (ref 0–22)

## 2016-02-18 LAB — HIV ANTIBODY (ROUTINE TESTING W REFLEX): HIV Screen 4th Generation wRfx: NONREACTIVE

## 2016-02-18 NOTE — Progress Notes (Signed)
PROGRESS NOTE  [name]  FOY:[id number] DOB: 23-May-1980 DOA: 02/15/2016 PCP: Simona Huh, MD  Brief Narrative:    [name] is a 35 y.o. 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  She has a stage 4 sacral ulcer which is followed by the wound care clinic by Dr. Dellia Nims and she has a chronic wound vac.  For the last month, she has had intermittent fevers which have been temporarily suppressed by antibiotics such as keflex.  She was also diagnosed with possible viral illness.  Over the last three days, her fevers returned but have been more consistent and higher, up to 101.108F with associated nausea, chills, sweats.  Her home health RN noticed increased erythema and purulence from her chronic sacral wound.  The [facility] RN contacted Dr. Dellia Nims who recommended admission to the hospital for assessment and treatment.  In the ER, VS notable for T = 101.39F, tachycardic to the low 100s.  WBC 10.7.  MRI of the sacrum demonstrated osteomyelitis of the posterior left acetabulum, ischial tuberosity, and inferior pubic ramus with myositis of the the left pelvis and upper leg.  No discrete abscess.    Assessment & Plan:   Active Problems:   Paraplegia (HCC)   Hypokalemia   Infected decubitus ulcer   Osteomyelitis of pelvis (HCC)   Myositis  Osteomyelitis of the posterior left acetabulum, ischial tuberosity, and inferior pubic ramus with myositis of the the left pelvis and upper leg.  Initially concerned for sepsis so started antibiotics, but tried to use empiric antibiotics for sepsis that would have less good bone penetration in case biopsy needed to be performed.  Fevers improved.  Lactic acid was eventually normal and BP remained stable.  Sepsis ruled out.  D/c doxycycline, zosyn in anticipation  of possible bone biopsy.   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 Dellia Nims. -  Continue to hold off on antibiotics -  IR consult placed.  Bone biopsy Monday -  Appreciate ID assistance -  Zinc, vit C, MVI ordered by plastic surgery  Hypokalemia resolved with oral and IV supplementation -  Continue IV potassium -  Repeat K in AM  Nausea, vomiting, and diarrhea, could be related to underlying infection.  Due to multiple courses of antibiotics, if stools are watery and copious, would send for C. Diff PCR.  Improved -  IVF, antiemetics  Paraplegia due to history of transverse myelitis  Normocytic anemia, likely due to inflammation from osteomyelitis -  No need for blood transfusion  Sleepy/somnolent, improved with decreased gabapentin -  Continue current dose of gabapentin   Muscle spasms -  Continue valium and baclofen  DVT prophylaxis:  lovenox  Code Status: full Family Communication: patient and her mother  Disposition Plan:  Likely to home in care of mother   Consultants:   Wound care, Dr. Iran Planas  Procedures:  none  Antimicrobials:   Vancomycin and doxycycline 11/15 - 11/16 (on hold for possible biopsy)   Subjective:  Stools are more formed.  Still having sensation of hot flashes and chills.  Eating some.      Objective: Vitals:   02/17/16 0923 02/17/16 1348 02/18/16 0539 02/18/16 1417  BP: (!) 100/56 109/71 115/79 107/66  Pulse: 94 90 76 76  Resp: 18 18 18 20   Temp: 97.6 F (36.4 C) 97.5 F (36.4 C) 97.5 F (36.4 C) 98.2 F (36.8 C)  TempSrc: Oral Oral Oral Oral  SpO2: 100% 100% 100% 99%    Intake/Output Summary (Last 24 hours) at 02/18/16 1431 Last data filed at 02/18/16 0820  Gross per 24 hour  Intake              480 ml  Output             1050 ml  Net             -570 ml   There were no vitals filed for this  visit.  Examination:  General exam:  Adult female.  No acute distress.  HEENT:  NCAT, MMM Respiratory system: Clear to auscultation bilaterally Cardiovascular system:  RRR.  No murmurs, rubs, gallops or clicks.  Warm extremities Gastrointestinal system: Normal active bowel sounds, soft, nondistended, nontender.  Urostomy with less dark urine MSK:  Flaccid legs, foot contractures.  Trace bilateral lower extremity edema.  Clubbed digits Neuro:  Paraplegic from waist down Ulcer on left lower buttock:  Packed today, but less pink and indurated around margins compared to admission date.  Unable to express purulence on exam.    Data Reviewed: I have personally reviewed following labs and imaging studies  CBC:  Recent Labs Lab 02/15/16 1548 02/16/16 0105  WBC 10.7* 8.6  NEUTROABS 9.5*  --   HGB 11.6* 10.2*  HCT 37.1 32.5*  MCV 82.1 82.3  PLT 293 973   Basic Metabolic Panel:  Recent Labs Lab 02/15/16 1548 02/16/16 0105 02/17/16 0506  NA 137 136 141  K 2.9* 2.7* 4.3  CL 98* 102 108  CO2 28 24 27   GLUCOSE 92 98 106*  BUN 9 6 5*  CREATININE 0.41* 0.37* 0.30*  CALCIUM 8.6* 8.3* 8.1*  MG 2.0  --   --    GFR: CrCl cannot be calculated (Unknown ideal weight.). Liver Function Tests:  Recent Labs Lab 02/15/16 1548  AST 10*  ALT 9*  ALKPHOS 93  BILITOT 1.2  PROT 6.7  ALBUMIN 3.6   No results for input(s): LIPASE, AMYLASE in the last 168 hours. No results for input(s): AMMONIA in the last 168 hours. Coagulation Profile:  Recent Labs Lab 02/15/16 2035  INR 1.15   Cardiac Enzymes:  Recent Labs Lab 02/17/16 0506  CKTOTAL 15*   BNP (last 3 results) No results for input(s): PROBNP in the last 8760 hours. HbA1C: No results for input(s): HGBA1C in the last 72 hours. CBG: No results for input(s): GLUCAP in the last 168 hours. Lipid Profile: No results for input(s): CHOL, HDL, LDLCALC, TRIG, CHOLHDL, LDLDIRECT in the last 72 hours. Thyroid Function Tests: No  results for input(s): TSH, T4TOTAL, FREET4, T3FREE, THYROIDAB in the last 72 hours. Anemia Panel: No results for input(s): VITAMINB12, FOLATE, FERRITIN, TIBC, IRON, RETICCTPCT in the last 72 hours. Urine analysis:    Component Value Date/Time   COLORURINE AMBER (A) 02/15/2016 1559   APPEARANCEUR TURBID (A) 02/15/2016 1559   LABSPEC 1.019 02/15/2016 1559   PHURINE 8.5 (H) 02/15/2016 1559   GLUCOSEU NEGATIVE 02/15/2016 1559   HGBUR NEGATIVE 02/15/2016 1559   BILIRUBINUR SMALL (A) 02/15/2016 1559   KETONESUR NEGATIVE 02/15/2016 1559   PROTEINUR 100 (A) 02/15/2016 1559   UROBILINOGEN 2.0 (H) 10/29/2014 1627   NITRITE NEGATIVE 02/15/2016 1559   LEUKOCYTESUR  MODERATE (A) 02/15/2016 1559   Sepsis Labs: @LABRCNTIP (procalcitonin:4,lacticidven:4)  ) Recent Results (from the past 240 hour(s))  Culture, blood (routine x 2)     Status: None (Preliminary result)   Collection Time: 02/15/16  3:56 PM  Result Value Ref Range Status   Specimen Description BLOOD BLOOD RIGHT FOREARM  Final   Special Requests BOTTLES DRAWN AEROBIC AND ANAEROBIC 5 CC EA  Final   Culture   Final    NO GROWTH 3 DAYS Performed at [facility]    Report Status PENDING  Incomplete  Urine culture     Status: Abnormal   Collection Time: 02/15/16  3:59 PM  Result Value Ref Range Status   Specimen Description URINE, RANDOM  Final   Special Requests NONE  Final   Culture MULTIPLE SPECIES PRESENT, SUGGEST RECOLLECTION (A)  Final   Report Status 02/16/2016 FINAL  Final  Culture, blood (routine x 2)     Status: None (Preliminary result)   Collection Time: 02/15/16  4:10 PM  Result Value Ref Range Status   Specimen Description BLOOD LEFT HAND  Final   Special Requests IN PEDIATRIC BOTTLE [place]  Final   Culture   Final    NO GROWTH 3 DAYS Performed at [facility]    Report Status PENDING  Incomplete      Radiology Studies: No results found.   Scheduled Meds: . baclofen  20 mg Oral TID  . diazepam   10 mg Oral QHS  . diazepam  5 mg Oral 2 times per day  . enoxaparin (LOVENOX) injection  40 mg Subcutaneous QHS  . gabapentin  300 mg Oral Q breakfast  . gabapentin  600 mg Oral BID AC  . lamoTRIgine  125 mg Oral Daily  . pantoprazole  40 mg Oral Daily  . sertraline  100 mg Oral Daily  . sodium hypochlorite   Irrigation BID   Continuous Infusions:    LOS: 3 days    Time spent: 30 min    [name], MD [facility] Pager [phone]  If 7PM-7AM, please contact night-coverage [email or web address] Password [email or web address] 02/18/2016, 2:31 PM

## 2016-02-19 LAB — HEPATITIS B SURFACE ANTIGEN: Hepatitis B Surface Ag: NEGATIVE

## 2016-02-19 NOTE — Progress Notes (Signed)
PROGRESS NOTE  [name]  BCW:[id number] DOB: [birth year] DOA: 02/15/2016 PCP: Simona Huh, MD  Brief Narrative:    [name] is a 35 y.o. 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  She has a stage 4 sacral ulcer which is followed by the wound care clinic by Dr. Dellia Nims and she has a chronic wound vac.  For the last month, she has had intermittent fevers which have been temporarily suppressed by antibiotics such as keflex.  She was also diagnosed with possible viral illness.  Over the last three days, her fevers returned but have been more consistent and higher, up to 101.32F with associated nausea, chills, sweats.  Her home health RN noticed increased erythema and purulence from her chronic sacral wound.  [facility] RN contacted Dr. Dellia Nims who recommended admission to the hospital for assessment and treatment.  In the ER, VS notable for T = 101.49F, tachycardic to the low 100s.  WBC 10.7.  MRI of the sacrum demonstrated osteomyelitis of the posterior left acetabulum, ischial tuberosity, and inferior pubic ramus with myositis of the the left pelvis and upper leg.  No discrete abscess.    Assessment & Plan:   Active Problems:   Paraplegia (HCC)   Hypokalemia   Infected decubitus ulcer   Osteomyelitis of pelvis (HCC)   Myositis  Osteomyelitis of the posterior left acetabulum, ischial tuberosity, and inferior pubic ramus with myositis of the the left pelvis and upper leg.  Initially concerned for sepsis so started antibiotics, but tried to use empiric antibiotics for sepsis that would have less good bone penetration in case biopsy needed to be performed.  Fevers improved.  Lactic acid was eventually normal and BP remained stable.  Sepsis ruled out.  D/c doxycycline, zosyn in anticipation  of possible bone biopsy.   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 Dellia Nims. -  Continue to hold off on antibiotics -  IR consult placed.  Bone biopsy Monday -  Appreciate ID assistance -  Zinc, vit C, MVI ordered by plastic surgery  Hypokalemia resolved with oral and IV supplementation  Nausea, vomiting, and diarrhea, could be related to underlying infection.  Due to multiple courses of antibiotics, if stools are watery and copious, would send for C. Diff PCR.  Improved -  IVF, antiemetics  Paraplegia due to history of transverse myelitis  Normocytic anemia, likely due to inflammation from osteomyelitis -  No need for blood transfusion  Sleepy/somnolent, improved with decreased gabapentin -  Continue current dose of gabapentin   Muscle spasms -  Continue valium and baclofen  HIV, HCV and hepatitis B negative  DVT prophylaxis:  lovenox  Code Status: full Family Communication: patient and her mother  Disposition Plan:  Likely to home in care of mother   Consultants:   Wound care/Plastic surgery  ID, Dr. Tommy Medal  Procedures:  none  Antimicrobials:   Vancomycin and doxycycline 11/15 - 11/16 (on hold for possible biopsy)   Subjective:  Stools are more formed.  Sleepy this morning but attributes this to staying up late last night to watch a movie  Objective: Vitals:   02/18/16 0539 02/18/16 1417 02/18/16  2100 02/19/16 0500  BP: 115/79 107/66 91/60 (!) 94/57  Pulse: 76 76 92 81  Resp: 18 20 18 18   Temp: 97.5 F (36.4 C) 98.2 F (36.8 C) 98 F (36.7 C) 97.9 F (36.6 C)  TempSrc: Oral Oral Oral Oral  SpO2: 100% 99% 100% 100%    Intake/Output Summary (Last 24 hours) at 02/19/16 1412 Last data filed at 02/19/16 1230  Gross per 24 hour  Intake              600 ml  Output              550 ml  Net               50 ml   There were no vitals filed for  this visit.  Examination:  General exam:  Adult female.  No acute distress.  Sleepy but arouses to answer questions HEENT:  NCAT, MMM Respiratory system: Clear to auscultation bilaterally Cardiovascular system:  RRR.  No murmurs, rubs, gallops or clicks.  Warm extremities Gastrointestinal system: Normal active bowel sounds, soft, nondistended, nontender.  Urostomy with less dark urine MSK:  Flaccid legs, foot contractures.  Trace bilateral lower extremity edema.  Clubbed digits Neuro:  Paraplegic from waist down    Data Reviewed: I have personally reviewed following labs and imaging studies  CBC:  Recent Labs Lab 02/15/16 1548 02/16/16 0105  WBC 10.7* 8.6  NEUTROABS 9.5*  --   HGB 11.6* 10.2*  HCT 37.1 32.5*  MCV 82.1 82.3  PLT 293 127   Basic Metabolic Panel:  Recent Labs Lab 02/15/16 1548 02/16/16 0105 02/17/16 0506  NA 137 136 141  K 2.9* 2.7* 4.3  CL 98* 102 108  CO2 28 24 27   GLUCOSE 92 98 106*  BUN 9 6 5*  CREATININE 0.41* 0.37* 0.30*  CALCIUM 8.6* 8.3* 8.1*  MG 2.0  --   --    GFR: CrCl cannot be calculated (Unknown ideal weight.). Liver Function Tests:  Recent Labs Lab 02/15/16 1548  AST 10*  ALT 9*  ALKPHOS 93  BILITOT 1.2  PROT 6.7  ALBUMIN 3.6   No results for input(s): LIPASE, AMYLASE in the last 168 hours. No results for input(s): AMMONIA in the last 168 hours. Coagulation Profile:  Recent Labs Lab 02/15/16 2035  INR 1.15   Cardiac Enzymes:  Recent Labs Lab 02/17/16 0506  CKTOTAL 15*   BNP (last 3 results) No results for input(s): PROBNP in the last 8760 hours. HbA1C: No results for input(s): HGBA1C in the last 72 hours. CBG: No results for input(s): GLUCAP in the last 168 hours. Lipid Profile: No results for input(s): CHOL, HDL, LDLCALC, TRIG, CHOLHDL, LDLDIRECT in the last 72 hours. Thyroid Function Tests: No results for input(s): TSH, T4TOTAL, FREET4, T3FREE, THYROIDAB in the last 72 hours. Anemia Panel: No results  for input(s): VITAMINB12, FOLATE, FERRITIN, TIBC, IRON, RETICCTPCT in the last 72 hours. Urine analysis:    Component Value Date/Time   COLORURINE AMBER (A) 02/15/2016 1559   APPEARANCEUR TURBID (A) 02/15/2016 1559   LABSPEC 1.019 02/15/2016 1559   PHURINE 8.5 (H) 02/15/2016 1559   GLUCOSEU NEGATIVE 02/15/2016 1559   HGBUR NEGATIVE 02/15/2016 1559   BILIRUBINUR SMALL (A) 02/15/2016 1559   KETONESUR NEGATIVE 02/15/2016 1559   PROTEINUR 100 (A) 02/15/2016 1559   UROBILINOGEN 2.0 (H) 10/29/2014 1627   NITRITE NEGATIVE 02/15/2016 1559   LEUKOCYTESUR MODERATE (A) 02/15/2016 1559   Sepsis Labs: @LABRCNTIP (procalcitonin:4,lacticidven:4)  ) Recent Results (  from the past 240 hour(s))  Culture, blood (routine x 2)     Status: None (Preliminary result)   Collection Time: 02/15/16  3:56 PM  Result Value Ref Range Status   Specimen Description BLOOD BLOOD RIGHT FOREARM  Final   Special Requests BOTTLES DRAWN AEROBIC AND ANAEROBIC 5 CC EA  Final   Culture   Final    NO GROWTH 3 DAYS Performed at [facility]    Report Status PENDING  Incomplete  Urine culture     Status: Abnormal   Collection Time: 02/15/16  3:59 PM  Result Value Ref Range Status   Specimen Description URINE, RANDOM  Final   Special Requests NONE  Final   Culture MULTIPLE SPECIES PRESENT, SUGGEST RECOLLECTION (A)  Final   Report Status 02/16/2016 FINAL  Final  Culture, blood (routine x 2)     Status: None (Preliminary result)   Collection Time: 02/15/16  4:10 PM  Result Value Ref Range Status   Specimen Description BLOOD LEFT HAND  Final   Special Requests IN PEDIATRIC BOTTLE 2CC  Final   Culture   Final    NO GROWTH 3 DAYS Performed at [facility]    Report Status PENDING  Incomplete      Radiology Studies: No results found.   Scheduled Meds: . baclofen  20 mg Oral TID  . diazepam  10 mg Oral QHS  . diazepam  5 mg Oral 2 times per day  . enoxaparin (LOVENOX) injection  40 mg Subcutaneous  QHS  . gabapentin  300 mg Oral Q breakfast  . gabapentin  600 mg Oral BID AC  . lamoTRIgine  125 mg Oral Daily  . pantoprazole  40 mg Oral Daily  . sertraline  100 mg Oral Daily   Continuous Infusions:    LOS: 4 days    Time spent: 30 min    [name], MD [facility] Pager [phone]  If 7PM-7AM, please contact night-coverage [email or web address] Password [email or web address] 02/19/2016, 2:12 PM

## 2016-02-20 ENCOUNTER — Encounter (HOSPITAL_COMMUNITY): Payer: Self-pay | Admitting: Radiology

## 2016-02-20 ENCOUNTER — Inpatient Hospital Stay (HOSPITAL_COMMUNITY): Payer: Medicaid Other

## 2016-02-20 LAB — CBC WITH DIFFERENTIAL/PLATELET
Basophils Absolute: 0 10*3/uL (ref 0.0–0.1)
Basophils Relative: 0 %
EOS ABS: 0.1 10*3/uL (ref 0.0–0.7)
EOS PCT: 2 %
HCT: 32.6 % — ABNORMAL LOW (ref 36.0–46.0)
Hemoglobin: 10 g/dL — ABNORMAL LOW (ref 12.0–15.0)
LYMPHS ABS: 1.9 10*3/uL (ref 0.7–4.0)
LYMPHS PCT: 30 %
MCH: 26 pg (ref 26.0–34.0)
MCHC: 30.7 g/dL (ref 30.0–36.0)
MCV: 84.7 fL (ref 78.0–100.0)
MONOS PCT: 5 %
Monocytes Absolute: 0.3 10*3/uL (ref 0.1–1.0)
Neutro Abs: 3.9 10*3/uL (ref 1.7–7.7)
Neutrophils Relative %: 63 %
PLATELETS: 274 10*3/uL (ref 150–400)
RBC: 3.85 MIL/uL — AB (ref 3.87–5.11)
RDW: 17.1 % — ABNORMAL HIGH (ref 11.5–15.5)
WBC: 6.2 10*3/uL (ref 4.0–10.5)

## 2016-02-20 LAB — CULTURE, BLOOD (ROUTINE X 2)
Culture: NO GROWTH
Culture: NO GROWTH

## 2016-02-20 MED ORDER — FLUMAZENIL 0.5 MG/5ML IV SOLN
INTRAVENOUS | Status: AC
  Filled 2016-02-20: qty 5

## 2016-02-20 MED ORDER — MIDAZOLAM HCL 2 MG/2ML IJ SOLN
INTRAMUSCULAR | Status: AC | PRN
  Administered 2016-02-20 (×2): 1 mg via INTRAVENOUS
  Administered 2016-02-20: 0.5 mg via INTRAVENOUS
  Administered 2016-02-20: 1 mg via INTRAVENOUS

## 2016-02-20 MED ORDER — NALOXONE HCL 0.4 MG/ML IJ SOLN
INTRAMUSCULAR | Status: AC
  Filled 2016-02-20: qty 1

## 2016-02-20 MED ORDER — FENTANYL CITRATE (PF) 100 MCG/2ML IJ SOLN
INTRAMUSCULAR | Status: AC | PRN
  Administered 2016-02-20: 50 ug via INTRAVENOUS
  Administered 2016-02-20: 25 ug via INTRAVENOUS
  Administered 2016-02-20 (×2): 50 ug via INTRAVENOUS

## 2016-02-20 MED ORDER — FENTANYL CITRATE (PF) 100 MCG/2ML IJ SOLN
INTRAMUSCULAR | Status: AC
  Filled 2016-02-20: qty 6

## 2016-02-20 MED ORDER — ENOXAPARIN SODIUM 40 MG/0.4ML ~~LOC~~ SOLN
40.0000 mg | SUBCUTANEOUS | Status: DC
  Administered 2016-02-21 – 2016-02-25 (×5): 40 mg via SUBCUTANEOUS
  Filled 2016-02-20 (×5): qty 0.4

## 2016-02-20 MED ORDER — DIAZEPAM 5 MG PO TABS
7.5000 mg | ORAL_TABLET | Freq: Every day | ORAL | Status: DC
  Administered 2016-02-20 – 2016-02-24 (×5): 7.5 mg via ORAL
  Filled 2016-02-20 (×5): qty 2

## 2016-02-20 MED ORDER — MIDAZOLAM HCL 2 MG/2ML IJ SOLN
INTRAMUSCULAR | Status: AC
  Filled 2016-02-20: qty 6

## 2016-02-20 MED ORDER — SODIUM CHLORIDE 0.9 % IV SOLN
500.0000 mg | INTRAVENOUS | Status: DC
  Administered 2016-02-20 – 2016-02-25 (×6): 500 mg via INTRAVENOUS
  Filled 2016-02-20 (×7): qty 10

## 2016-02-20 NOTE — Progress Notes (Signed)
Pharmacy Antibiotic Note  [name] is a 35 y.o. female with history of transverse myelitis and MS with paraplegia and chronic stage IV decubitus ulcers, also s/p diverting urostomy admitted on 02/15/2016 with worsening decubitus ulcer and osteomyelitis involving left acetabulum extending into inferior pubic ramus and ischium. Pt initially treated with zosyn and doxycycline which were stopped for bone biopsy on 11/20.  ID following and have consulted pharmacy to start Daptomycin.  Pt has allergy to vancomycin (anaphlaxis).   Last charted weight = 65.8kg CrCl estimated ~100 with rounded SCr 0.8  Plan: Daptomycin 500mg  IV q24h (~7.6mg /kg)  F/u CK F/u renal function, cultures, clinical course  Temp (24hrs), Avg:97.9 F (36.6 C), Min:97.4 F (36.3 C), Max:98.3 F (36.8 C)   Recent Labs Lab 02/15/16 1548 02/15/16 1928 02/16/16 0105 02/17/16 0506 02/20/16 0417  WBC 10.7*  --  8.6  --  6.2  CREATININE 0.41*  --  0.37* 0.30*  --   LATICACIDVEN  --  1.0 0.7  --   --     CrCl cannot be calculated (Unknown ideal weight.).    Allergies  Allergen Reactions  . Iodine Anaphylaxis  . Latex Anaphylaxis  . Other Anaphylaxis    ALL -MYCINS. Pt states she has had all "-mycins" in all classes and all of them cause reactions.    Marland Kitchen Shellfish-Derived Products Anaphylaxis    Reaction unknown  . Vancomycin Anaphylaxis  . Aspirin Other (See Comments)    Reaction unknown  . Strawberry Extract Hives and Rash    Antimicrobials this admission: Zosyn 11/15 >> 11/16 Doxycycline (MD) 11/15 >> 11/16 Daptomycin 11/20 >>  Dose adjustments this admission: ---  Microbiology results: 11/15 BCx: NGF 11/15 UCx: Multiple species present, suggest recollection 11/20 Bone Cx: collected   Thank you for allowing pharmacy to be a part of this patient's care.  Ralene Bathe, PharmD, BCPS 02/20/2016, 3:05 PM  Pager: [phone]

## 2016-02-20 NOTE — Progress Notes (Signed)
Subjective: No new complaints   Antibiotics:  Anti-infectives    Start     Dose/Rate Route Frequency Ordered Stop   02/20/16 1600  DAPTOmycin (CUBICIN) 500 mg in sodium chloride 0.9 % IVPB     500 mg 220 mL/hr over 30 Minutes Intravenous Every 24 hours 02/20/16 1524     02/16/16 0000  piperacillin-tazobactam (ZOSYN) IVPB 3.375 g  Status:  Discontinued     3.375 g 12.5 mL/hr over 240 Minutes Intravenous Every 8 hours 02/15/16 1759 02/16/16 1531   02/15/16 1900  doxycycline (VIBRAMYCIN) 100 mg in dextrose 5 % 250 mL IVPB  Status:  Discontinued     100 mg 125 mL/hr over 120 Minutes Intravenous Every 12 hours 02/15/16 1838 02/16/16 1531   02/15/16 1830  piperacillin-tazobactam (ZOSYN) IVPB 3.375 g     3.375 g 100 mL/hr over 30 Minutes Intravenous  Once 02/15/16 1759 02/15/16 1848      Medications: Scheduled Meds: . baclofen  20 mg Oral TID  . DAPTOmycin (CUBICIN)  IV  500 mg Intravenous Q24H  . diazepam  5 mg Oral 2 times per day  . diazepam  7.5 mg Oral QHS  . [START ON 02/21/2016] enoxaparin (LOVENOX) injection  40 mg Subcutaneous Q24H  . fentaNYL      . gabapentin  300 mg Oral Q breakfast  . gabapentin  600 mg Oral BID AC  . lamoTRIgine  125 mg Oral Daily  . midazolam      . pantoprazole  40 mg Oral Daily  . sertraline  100 mg Oral Daily   Continuous Infusions: PRN Meds:.acetaminophen, bisacodyl, fluticasone, loratadine, naproxen sodium, ondansetron **OR** ondansetron (ZOFRAN) IV, senna-docusate    Objective: Weight change:   Intake/Output Summary (Last 24 hours) at 02/20/16 1805 Last data filed at 02/20/16 1637  Gross per 24 hour  Intake                0 ml  Output             1700 ml  Net            -1700 ml   Blood pressure 96/62, pulse 89, temperature 97.4 F (36.3 C), temperature source Oral, resp. rate 14, last menstrual period 02/19/2016, SpO2 100 %. Temp:  [97.4 F (36.3 C)-98.3 F (36.8 C)] 97.4 F (36.3 C) (11/20 1406) Pulse Rate:   [84-444] 89 (11/20 1406) Resp:  [7-17] 14 (11/20 1406) BP: (96-137)/(57-79) 96/62 (11/20 1406) SpO2:  [98 %-100 %] 100 % (11/20 1406)  Physical Exam: General: Alert and awake, oriented x3, not in any acute distress. HEENT: anicteric sclera,  EOMI,  Cardiovascular: regular rate, normal r,   Pulmonary:  no wheezing, or respiratory distress Gastrointestinal: soft , nondistended,  Skin, soft tissue: Acute results was not examined today by me Neuro: Paraplegic with poor ability to use her left hand as   CBC: CBC Latest Ref Rng & Units 02/20/2016 02/16/2016 02/15/2016  WBC 4.0 - 10.5 K/uL 6.2 8.6 10.7(H)  Hemoglobin 12.0 - 15.0 g/dL 10.0(L) 10.2(L) 11.6(L)  Hematocrit 36.0 - 46.0 % 32.6(L) 32.5(L) 37.1  Platelets 150 - 400 K/uL 274 258 293      BMET No results for input(s): NA, K, CL, CO2, GLUCOSE, BUN, CREATININE, CALCIUM in the last 72 hours.   Liver Panel  No results for input(s): PROT, ALBUMIN, AST, ALT, ALKPHOS, BILITOT, BILIDIR, IBILI in the last 72 hours.     Sedimentation Rate  Recent  Labs  02/18/16 0524  ESRSEDRATE 10   C-Reactive Protein  Recent Labs  02/18/16 0524  CRP 2.6*    Micro Results: Recent Results (from the past 720 hour(s))  Urine culture     Status: Abnormal   Collection Time: 01/29/16  1:44 PM  Result Value Ref Range Status   Specimen Description URINE, CLEAN CATCH  Final   Special Requests NONE  Final   Culture MULTIPLE SPECIES PRESENT, SUGGEST RECOLLECTION (A)  Final   Report Status 01/31/2016 FINAL  Final  Culture, blood (routine x 2)     Status: None   Collection Time: 02/15/16  3:56 PM  Result Value Ref Range Status   Specimen Description BLOOD BLOOD RIGHT FOREARM  Final   Special Requests BOTTLES DRAWN AEROBIC AND ANAEROBIC 5 CC EA  Final   Culture   Final    NO GROWTH 5 DAYS Performed at [facility]    Report Status 02/20/2016 FINAL  Final  Urine culture     Status: Abnormal   Collection Time: 02/15/16  3:59 PM    Result Value Ref Range Status   Specimen Description URINE, RANDOM  Final   Special Requests NONE  Final   Culture MULTIPLE SPECIES PRESENT, SUGGEST RECOLLECTION (A)  Final   Report Status 02/16/2016 FINAL  Final  Culture, blood (routine x 2)     Status: None   Collection Time: 02/15/16  4:10 PM  Result Value Ref Range Status   Specimen Description BLOOD LEFT HAND  Final   Special Requests IN PEDIATRIC BOTTLE [place]  Final   Culture   Final    NO GROWTH 5 DAYS Performed at [facility]    Report Status 02/20/2016 FINAL  Final    Studies/Results: Ct Aspiration  Result Date: 02/20/2016 CLINICAL DATA:  Decubitus ulcer with suggestion of osteomyelitis involving the the left ischium on MRI. EXAM: CT GUIDED CORE BIOPSY OF LEFT ISCHIUM ANESTHESIA/SEDATION: Intravenous Fentanyl and Versed were administered as conscious sedation during continuous monitoring of the patient's level of consciousness and physiological / cardiorespiratory status by the radiology RN, with a total moderate sedation time of 21 minutes. PROCEDURE: The procedure risks, benefits, and alternatives were explained to the patient. Questions regarding the procedure were encouraged and answered. The patient understands and consents to the procedure. Patient placed prone. Select axial scans through the pelvis obtained the, an appropriate skin entry site was identified and marked. The operative field was prepped with chlorhexidinein a sterile fashion, and a sterile drape was applied covering the operative field. A sterile gown and sterile gloves were used for the procedure. Local anesthesia was provided with 1% Lidocaine. Under CT fluoroscopic guidance, a 11 gauge Cook bone trocar needle was advanced to the margin of the lesion. Once needle tip position was confirmed, core biopsy samples were obtained, submitted in nonbacteriostatic saline to microbiology. The guide needle was removed. Postprocedure scans show no hemorrhage or other  apparent complication. The patient tolerated the procedure well. COMPLICATIONS: None immediate FINDINGS: Sclerosis in the left ischium and ischial tuberosity was identified. Representative core biopsy samples were obtained under CT fluoroscopic guidance. IMPRESSION: 1. Technically successful core biopsy of left ischium, sent for microbiology analysis. Electronically Signed   By: Lucrezia Europe M.D.   On: 02/20/2016 16:40      Assessment/Plan:  INTERVAL HISTORY: Has had bone biopsy for culture performed by interventional radiology today.   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 She is status post biopsy for culture today  #1 Pelvic osteomyelitis:  Past postbiopsy follow culture results  I'm starting daptomycin and hold off on adding gram-negative therapy for now though if we don't grow an organism I will add in her gram-negative cephalosporin such as cefepime.  We can treat her for 6-8 weeks of parenteral antibiotics  This is however a much more, located situation as I mentioned previously  As mentioned also wonder if she would benefit from a diverting colostomy to protect her wound  LOS: 5 days   Alcide Evener 02/20/2016, 6:05 PM

## 2016-02-20 NOTE — Progress Notes (Addendum)
PROGRESS NOTE  [name]  EPP:[id number] DOB: 1981-02-21 DOA: 02/15/2016 PCP: Simona Huh, MD  Brief Narrative:    [name] is a 35 y.o. 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  She has a stage 4 sacral ulcer which is followed by the wound care clinic by Dr. Dellia Nims and she has a chronic wound vac.  For the last month, she has had intermittent fevers which have been temporarily suppressed by antibiotics such as keflex.  She was also diagnosed with possible viral illness.  Over the last three days, her fevers returned but have been more consistent and higher, up to 101.5F with associated nausea, chills, sweats.  Her home health RN noticed increased erythema and purulence from her chronic sacral wound.  The [facility] RN contacted Dr. Dellia Nims who recommended admission to the hospital for assessment and treatment.  In the ER, VS notable for T = 101.72F, tachycardic to the low 100s.  WBC 10.7.  MRI of the sacrum demonstrated osteomyelitis of the posterior left acetabulum, ischial tuberosity, and inferior pubic ramus with myositis of the the left pelvis and upper leg.  No discrete abscess.    Assessment & Plan:   Active Problems:   Paraplegia (HCC)   Hypokalemia   Infected decubitus ulcer   Osteomyelitis of pelvis (HCC)   Myositis  Osteomyelitis of the posterior left acetabulum, ischial tuberosity, and inferior pubic ramus with myositis of the the left pelvis and upper leg.  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 Dellia Nims. -  IR assistance appreciated -  Bone biopsy performed on 11/20  -  Appreciate ID assistance:  daptomycin ordered post-biopsy  today -  continue Zinc, vit C, MVI  Hypokalemia resolved with oral and IV supplementation  Nausea, vomiting, and diarrhea, likely due to underlying infection and resolved with IVF and antibiotics  Paraplegia due to history of transverse myelitis  Normocytic anemia, likely due to inflammation from osteomyelitis -  No need for blood transfusion  Sleepy/somnolent, persistent and patient asking to reduce her medications more -  Continue reduced dose of gabapentin  -  Reduce valium to 7.54m tonight   Muscle spasms -  Continue baclofen  HIV, HCV and hepatitis B negative  DVT prophylaxis:  lovenox  Code Status: full Family Communication: patient and her mother  Disposition Plan:  Likely to home in care of mother   Consultants:   Wound care/Plastic surgery  ID, Dr. VTommy Medal IR for bone biopsy  Procedures:  Bone biopsy 11/20   Antimicrobials:   Vancomycin and doxycycline 11/15 - 11/16 (on hold for possible biopsy)   Subjective:  Stools are more formed.  Still sleepy this morning and feels like her valium is making her hungover in the mornings.  would like to reduce her valium slightly.    Objective: Vitals:   02/20/16 1317 02/20/16 1322 02/20/16 1325 02/20/16 1406  BP: 125/73 137/70 137/70 96/62  Pulse: 85 93 93 89  Resp: (!) _0 Temp:    97.4 F (36.3 C)  TempSrc:    Oral  SpO2: 100% 100% 100% 100%  Intake/Output Summary (Last 24 hours) at 02/20/16 1624 Last data filed at 02/20/16 1541  Gross per 24 hour  Intake              240 ml  Output             1150 ml  Net             -910 ml   There were no vitals filed for this visit.  Examination:  General exam:  Adult female.  No acute distress.  Sleepy but arouses to answer questions HEENT:  NCAT, MMM Respiratory system: Clear to auscultation bilaterally Cardiovascular system:  RRR.  No murmurs, rubs, gallops or clicks.  Warm extremities Gastrointestinal system: Normal active bowel sounds,  soft, nondistended, nontender.  Urostomy with dark urine MSK:  Flaccid legs, foot contractures.  Trace bilateral lower extremity edema.  Clubbed digits Neuro:  Paraplegic from waist down    Data Reviewed: I have personally reviewed following labs and imaging studies  CBC:  Recent Labs Lab 02/15/16 1548 02/16/16 0105 02/20/16 0417  WBC 10.7* 8.6 6.2  NEUTROABS 9.5*  --  3.9  HGB 11.6* 10.2* 10.0*  HCT 37.1 32.5* 32.6*  MCV 82.1 82.3 84.7  PLT 293 258 274   Basic Metabolic Panel:  Recent Labs Lab 02/15/16 1548 02/16/16 0105 02/17/16 0506  NA 137 136 141  K 2.9* 2.7* 4.3  CL 98* 102 108  CO2 28 24 27  GLUCOSE 92 98 106*  BUN 9 6 5*  CREATININE 0.41* 0.37* 0.30*  CALCIUM 8.6* 8.3* 8.1*  MG 2.0  --   --    GFR: CrCl cannot be calculated (Unknown ideal weight.). Liver Function Tests:  Recent Labs Lab 02/15/16 1548  AST 10*  ALT 9*  ALKPHOS 93  BILITOT 1.2  PROT 6.7  ALBUMIN 3.6   No results for input(s): LIPASE, AMYLASE in the last 168 hours. No results for input(s): AMMONIA in the last 168 hours. Coagulation Profile:  Recent Labs Lab 02/15/16 2035  INR 1.15   Cardiac Enzymes:  Recent Labs Lab 02/17/16 0506  CKTOTAL 15*   BNP (last 3 results) No results for input(s): PROBNP in the last 8760 hours. HbA1C: No results for input(s): HGBA1C in the last 72 hours. CBG: No results for input(s): GLUCAP in the last 168 hours. Lipid Profile: No results for input(s): CHOL, HDL, LDLCALC, TRIG, CHOLHDL, LDLDIRECT in the last 72 hours. Thyroid Function Tests: No results for input(s): TSH, T4TOTAL, FREET4, T3FREE, THYROIDAB in the last 72 hours. Anemia Panel: No results for input(s): VITAMINB12, FOLATE, FERRITIN, TIBC, IRON, RETICCTPCT in the last 72 hours. Urine analysis:    Component Value Date/Time   COLORURINE AMBER (A) 02/15/2016 1559   APPEARANCEUR TURBID (A) 02/15/2016 1559   LABSPEC 1.019 02/15/2016 1559   PHURINE 8.5 (H) 02/15/2016 1559    GLUCOSEU NEGATIVE 02/15/2016 1559   HGBUR NEGATIVE 02/15/2016 1559   BILIRUBINUR SMALL (A) 02/15/2016 1559   KETONESUR NEGATIVE 02/15/2016 1559   PROTEINUR 100 (A) 02/15/2016 1559   UROBILINOGEN 2.0 (H) 10/29/2014 1627   NITRITE NEGATIVE 02/15/2016 1559   LEUKOCYTESUR MODERATE (A) 02/15/2016 1559   Sepsis Labs: @LABRCNTIP(procalcitonin:4,lacticidven:4)  ) Recent Results (from the past 240 hour(s))  Culture, blood (routine x 2)     Status: None   Collection Time: 02/15/16  3:56 PM  Result Value Ref Range Status   Specimen Description BLOOD BLOOD RIGHT FOREARM  Final   Special Requests BOTTLES DRAWN AEROBIC AND ANAEROBIC 5   CC EA  Final   Culture   Final    NO GROWTH 5 DAYS Performed at [facility]    Report Status 02/20/2016 FINAL  Final  Urine culture     Status: Abnormal   Collection Time: 02/15/16  3:59 PM  Result Value Ref Range Status   Specimen Description URINE, RANDOM  Final   Special Requests NONE  Final   Culture MULTIPLE SPECIES PRESENT, SUGGEST RECOLLECTION (A)  Final   Report Status 02/16/2016 FINAL  Final  Culture, blood (routine x 2)     Status: None   Collection Time: 02/15/16  4:10 PM  Result Value Ref Range Status   Specimen Description BLOOD LEFT HAND  Final   Special Requests IN PEDIATRIC BOTTLE 2CC  Final   Culture   Final    NO GROWTH 5 DAYS Performed at [facility]    Report Status 02/20/2016 FINAL  Final      Radiology Studies: No results found.   Scheduled Meds: . baclofen  20 mg Oral TID  . DAPTOmycin (CUBICIN)  IV  500 mg Intravenous Q24H  . diazepam  10 mg Oral QHS  . diazepam  5 mg Oral 2 times per day  . [START ON 02/21/2016] enoxaparin (LOVENOX) injection  40 mg Subcutaneous Q24H  . fentaNYL      . gabapentin  300 mg Oral Q breakfast  . gabapentin  600 mg Oral BID AC  . lamoTRIgine  125 mg Oral Daily  . midazolam      . pantoprazole  40 mg Oral Daily  . sertraline  100 mg Oral Daily   Continuous  Infusions:    LOS: 5 days    Time spent: 30 min    , , MD [facility] Pager [phone]  If 7PM-7AM, please contact night-coverage [email or web address] Password [email or web address] 02/20/2016, 4:24 PM          

## 2016-02-20 NOTE — Progress Notes (Signed)
MEDICATION RELATED CONSULT NOTE   IR Procedure Consult - Anticoagulant/Antiplatelet PTA/Inpatient Med List Review by Pharmacist   Procedure: Bone biopsy    Completed: 11/20 @ 1330  Post-Procedural bleeding risk per IR MD assessment:  Standard  Antithrombotic medications on inpatient or PTA profile prior to procedure:    Lovenox 40mg  q24h, last dose 11/18 at 2246  Recommended restart time per IR Post-Procedure Guidelines:   Next day in the morning  Plan:   Lovenox 40mg  SQ q24h, next dose 11/21 @ 10AM  Ralene Bathe, PharmD, BCPS 02/20/2016, 3:30 PM  Pager: [phone]

## 2016-02-20 NOTE — Procedures (Signed)
CT core biopsy LEFT ischial ramus , sent for GS, C&S No complication No blood loss. See complete dictation in [facility].

## 2016-02-21 DIAGNOSIS — Z9889 Other specified postprocedural states: Secondary | ICD-10-CM

## 2016-02-21 MED ORDER — TRIAMCINOLONE 0.1 % CREAM:EUCERIN CREAM 1:1
TOPICAL_CREAM | Freq: Three times a day (TID) | CUTANEOUS | Status: DC | PRN
  Administered 2016-02-21: 1 via TOPICAL
  Filled 2016-02-21: qty 1

## 2016-02-21 MED ORDER — GABAPENTIN 300 MG PO CAPS
600.0000 mg | ORAL_CAPSULE | Freq: Every day | ORAL | Status: DC
  Administered 2016-02-21 – 2016-02-24 (×4): 600 mg via ORAL
  Filled 2016-02-21 (×4): qty 2

## 2016-02-21 MED ORDER — GABAPENTIN 300 MG PO CAPS
300.0000 mg | ORAL_CAPSULE | Freq: Two times a day (BID) | ORAL | Status: DC
  Administered 2016-02-21 – 2016-02-25 (×8): 300 mg via ORAL
  Filled 2016-02-21 (×9): qty 1

## 2016-02-21 NOTE — Progress Notes (Signed)
PROGRESS NOTE  [name]  SHF:[id number] DOB: 09/03/1980 DOA: 02/15/2016 PCP: Simona Huh, MD  Brief Narrative:    [name] is a 35 y.o. 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  She has a stage 4 sacral ulcer which is followed in the wound care clinic by Dr. Dellia Nims and she has a chronic wound vac.  For the last month, she has had intermittent fevers which have been temporarily suppressed by Antoninette Lerner courses of oral antibiotics.  After her last course of antibiotics, she developed fever up to 101.39F with associated nausea, chills, sweats, and her home health RN noticed increased erythema and purulence from her chronic sacral wound so she presented to the ER.  In the ER, VS notable for T = 101.67F, tachycardic to the low 100s and mildly hypotensive.  WBC 10.7.  MRI of the sacrum demonstrated osteomyelitis of the posterior left acetabulum, ischial tuberosity, and inferior pubic ramus with myositis of the the left pelvis and upper leg.  No discrete abscess.  Due to concern for sepsis, she received initial antibiotics which were stopped the next day when her blood pressure stabilized and lactate remained normal.  She remained off of antibiotics for 4 days prior to bone biopsy which was performed on 11/20 by interventional radiology.  Smear demonstrated no bacteria.  Culture is pending.  She is on empiric daptomycin pending culture data and ID is following.  She may need PICC line for long course of IV antibiotics, but Dr. Tommy Medal asked that we wait for the culture data before placing PICC line because oral fluoroquinolone may still be an option.  Plastic surgery was consulted and recommended that [name] follow up with [facility] for ongoing wound management and eventual flap once her  osteomyelitis is cured and her prealbumin is greater than 16.  She would need to lie on her belly for about a month after flap procedure.    Assessment & Plan:   Active Problems:   Paraplegia (HCC)   Hypokalemia   Infected decubitus ulcer   Osteomyelitis of pelvis (HCC)   Myositis  Osteomyelitis of the posterior left acetabulum, ischial tuberosity, and inferior pubic ramus with myositis of the the left pelvis and upper leg.  -  ESR 14, CRP 20.8 >> 2.6 -  CK wnl -  Blood culture NGTD -  Wound care /plastic surgery stated patient is not ready for flap -  IR assistance appreciated -  Bone biopsy performed on 11/20, culture NGTD -  Appreciate ID assistance:  daptomycin pending biopsy data -  continue Zinc, vit C, MVI  Hypokalemia resolved with oral and IV supplementation  Nausea, vomiting, and diarrhea, likely due to underlying infection and resolved with IVF and antibiotics  Paraplegia due to history of transverse myelitis  Normocytic anemia, likely due to inflammation from osteomyelitis -  No need for blood transfusion  Sleepy/somnolent, patient asking to reduce her medications gradually while in hospital so that she is more lucid during the day.  Feels overmedicated. -  reduced dose of lunchtime gabapentin today -  Continue valium at 7.27m qhs (reduced on 11/20)   Muscle spasms -  Continue baclofen  HIV, HCV and hepatitis B negative  Severe protein calorie malnutrition, prealbumin 6.1 -  Needs prealbumin to be at least 16  before she would be a candidate for definitive surgery  DVT prophylaxis:  lovenox  Code Status: full Family Communication: patient and her mother  Disposition Plan:  Likely to home in care of mother   Consultants:   Wound care/Plastic surgery  ID, Dr. Tommy Medal  IR for bone biopsy  Procedures:  Bone biopsy 11/20   Antimicrobials:   Vancomycin and doxycycline 11/15 - 11/16 (on hold for possible biopsy)   Subjective:  Having daily  stools.  More alert today and feeling well, but still napping frequently during the day.    Objective: Vitals:   02/21/16 0200 02/21/16 0501 02/21/16 0853 02/21/16 1438  BP:  (!) 93/50 (!) 104/59 112/68  Pulse:  95 90 (!) 101  Resp:  18 18 18   Temp:  98.3 F (36.8 C) 97.4 F (36.3 C)   TempSrc:  Oral Oral   SpO2:  99% 99% 100%  Weight: 65.7 kg (144 lb 13.5 oz)     Height: 5' 3"  (1.6 m)       Intake/Output Summary (Last 24 hours) at 02/21/16 1615 Last data filed at 02/21/16 1430  Gross per 24 hour  Intake              240 ml  Output             1675 ml  Net            -1435 ml   Filed Weights   02/21/16 0200  Weight: 65.7 kg (144 lb 13.5 oz)    Examination:  General exam:  Adult female.  No acute distress.  Awake today and more alert HEENT:  NCAT, MMM Respiratory system: Clear to auscultation bilaterally Cardiovascular system:  RRR.  No murmurs, rubs, gallops or clicks.  Warm extremities Gastrointestinal system: Normal active bowel sounds, soft, nondistended, nontender.  Urostomy with dark urine MSK:  Flaccid legs, foot contractures.  Trace bilateral lower extremity edema.  Clubbed digits Neuro:  Paraplegic from waist down    Data Reviewed: I have personally reviewed following labs and imaging studies  CBC:  Recent Labs Lab 02/15/16 1548 02/16/16 0105 02/20/16 0417  WBC 10.7* 8.6 6.2  NEUTROABS 9.5*  --  3.9  HGB 11.6* 10.2* 10.0*  HCT 37.1 32.5* 32.6*  MCV 82.1 82.3 84.7  PLT 293 258 811   Basic Metabolic Panel:  Recent Labs Lab 02/15/16 1548 02/16/16 0105 02/17/16 0506  NA 137 136 141  K 2.9* 2.7* 4.3  CL 98* 102 108  CO2 28 24 27   GLUCOSE 92 98 106*  BUN 9 6 5*  CREATININE 0.41* 0.37* 0.30*  CALCIUM 8.6* 8.3* 8.1*  MG 2.0  --   --    GFR: Estimated Creatinine Clearance: 89.4 mL/min (by C-G formula based on SCr of 0.3 mg/dL (L)). Liver Function Tests:  Recent Labs Lab 02/15/16 1548  AST 10*  ALT 9*  ALKPHOS 93  BILITOT 1.2  PROT  6.7  ALBUMIN 3.6   No results for input(s): LIPASE, AMYLASE in the last 168 hours. No results for input(s): AMMONIA in the last 168 hours. Coagulation Profile:  Recent Labs Lab 02/15/16 2035  INR 1.15   Cardiac Enzymes:  Recent Labs Lab 02/17/16 0506  CKTOTAL 15*   BNP (last 3 results) No results for input(s): PROBNP in the last 8760 hours. HbA1C: No results for input(s): HGBA1C in the last 72 hours. CBG: No results for input(s): GLUCAP in the last 168 hours. Lipid Profile: No results for  input(s): CHOL, HDL, LDLCALC, TRIG, CHOLHDL, LDLDIRECT in the last 72 hours. Thyroid Function Tests: No results for input(s): TSH, T4TOTAL, FREET4, T3FREE, THYROIDAB in the last 72 hours. Anemia Panel: No results for input(s): VITAMINB12, FOLATE, FERRITIN, TIBC, IRON, RETICCTPCT in the last 72 hours. Urine analysis:    Component Value Date/Time   COLORURINE AMBER (A) 02/15/2016 1559   APPEARANCEUR TURBID (A) 02/15/2016 1559   LABSPEC 1.019 02/15/2016 1559   PHURINE 8.5 (H) 02/15/2016 1559   GLUCOSEU NEGATIVE 02/15/2016 1559   HGBUR NEGATIVE 02/15/2016 1559   BILIRUBINUR SMALL (A) 02/15/2016 1559   KETONESUR NEGATIVE 02/15/2016 1559   PROTEINUR 100 (A) 02/15/2016 1559   UROBILINOGEN 2.0 (H) 10/29/2014 1627   NITRITE NEGATIVE 02/15/2016 1559   LEUKOCYTESUR MODERATE (A) 02/15/2016 1559   Sepsis Labs: '@LABRCNTIP'$ (procalcitonin:4,lacticidven:4)  ) Recent Results (from the past 240 hour(s))  Culture, blood (routine x 2)     Status: None   Collection Time: 02/15/16  3:56 PM  Result Value Ref Range Status   Specimen Description BLOOD BLOOD RIGHT FOREARM  Final   Special Requests BOTTLES DRAWN AEROBIC AND ANAEROBIC 5 CC EA  Final   Culture   Final    NO GROWTH 5 DAYS Performed at [facility]    Report Status 02/20/2016 FINAL  Final  Urine culture     Status: Abnormal   Collection Time: 02/15/16  3:59 PM  Result Value Ref Range Status   Specimen Description URINE, RANDOM   Final   Special Requests NONE  Final   Culture MULTIPLE SPECIES PRESENT, SUGGEST RECOLLECTION (A)  Final   Report Status 02/16/2016 FINAL  Final  Culture, blood (routine x 2)     Status: None   Collection Time: 02/15/16  4:10 PM  Result Value Ref Range Status   Specimen Description BLOOD LEFT HAND  Final   Special Requests IN PEDIATRIC BOTTLE [place]  Final   Culture   Final    NO GROWTH 5 DAYS Performed at [facility]    Report Status 02/20/2016 FINAL  Final  Aerobic/Anaerobic Culture (surgical/deep wound)     Status: None (Preliminary result)   Collection Time: 02/20/16  1:46 PM  Result Value Ref Range Status   Specimen Description BONE NEEDLE CORE LEFT  Final   Special Requests NONE  Final   Gram Stain   Final    FEW WBC PRESENT,BOTH PMN AND MONONUCLEAR NO ORGANISMS SEEN    Culture   Final    NO GROWTH < 24 HOURS Performed at [facility]    Report Status PENDING  Incomplete      Radiology Studies: Ct Aspiration  Result Date: 02/20/2016 CLINICAL DATA:  Decubitus ulcer with suggestion of osteomyelitis involving the the left ischium on MRI. EXAM: CT GUIDED CORE BIOPSY OF LEFT ISCHIUM ANESTHESIA/SEDATION: Intravenous Fentanyl and Versed were administered as conscious sedation during continuous monitoring of the patient's level of consciousness and physiological / cardiorespiratory status by the radiology RN, with a total moderate sedation time of 21 minutes. PROCEDURE: The procedure risks, benefits, and alternatives were explained to the patient. Questions regarding the procedure were encouraged and answered. The patient understands and consents to the procedure. Patient placed prone. Select axial scans through the pelvis obtained the, an appropriate skin entry site was identified and marked. The operative field was prepped with chlorhexidinein a sterile fashion, and a sterile drape was applied covering the operative field. A sterile gown and sterile gloves were used  for the procedure. Local  anesthesia was provided with 1% Lidocaine. Under CT fluoroscopic guidance, a 11 gauge Cook bone trocar needle was advanced to the margin of the lesion. Once needle tip position was confirmed, core biopsy samples were obtained, submitted in nonbacteriostatic saline to microbiology. The guide needle was removed. Postprocedure scans show no hemorrhage or other apparent complication. The patient tolerated the procedure well. COMPLICATIONS: None immediate FINDINGS: Sclerosis in the left ischium and ischial tuberosity was identified. Representative core biopsy samples were obtained under CT fluoroscopic guidance. IMPRESSION: 1. Technically successful core biopsy of left ischium, sent for microbiology analysis. Electronically Signed   By: Lucrezia Europe M.D.   On: 02/20/2016 16:40     Scheduled Meds: . baclofen  20 mg Oral TID  . DAPTOmycin (CUBICIN)  IV  500 mg Intravenous Q24H  . diazepam  5 mg Oral 2 times per day  . diazepam  7.5 mg Oral QHS  . enoxaparin (LOVENOX) injection  40 mg Subcutaneous Q24H  . gabapentin  300 mg Oral BID WC  . gabapentin  600 mg Oral QHS  . lamoTRIgine  125 mg Oral Daily  . pantoprazole  40 mg Oral Daily  . sertraline  100 mg Oral Daily   Continuous Infusions:    LOS: 6 days    Time spent: 30 min    [name], MD [facility] Pager [phone]  If 7PM-7AM, please contact night-coverage [email or web address] Password [email or web address] 02/21/2016, 4:15 PM

## 2016-02-21 NOTE — Consult Note (Signed)
[facility] Nurse wound consult note Reason for Consult:Stage 4 pressure injury to left ischium Wound type:Stage 4 with osteomyelitis Pressure Ulcer POA: Yes Measurement: 5 cm x 4 cm x 3.4 cm  Wound DO:[id number], pink and nongranulating Drainage (amount, consistency, odor) Moderate serosanguinous  Musty odor Periwound:Intact Dressing procedure/placement/frequency:Cleanse wound to left ischium with NS and pat gently dry.  Gently fill wound bed with Aquacel Ag and cove with ABD pad.  Secure with tape.  Change daily.  Will not follow at this time.  Please re-consult if needed.  Domenic Moras RN BSN [place] Pager [phone]

## 2016-02-21 NOTE — Progress Notes (Addendum)
Current wound care order for patient includes 0.125% Dakins solution wet to dry to left buttocks ulcer. Currently out of Dakins solution. Paged Dr. Irene Limbo for new wound care orders.  Dr. Para Skeans RN, [place], stated she would call back with new wound care orders. Dr. Maylene Roes returned phone call and stated that since she is no longer following patient that we need our wound nurse to place new wound care orders. Placed order for wound consult.

## 2016-02-21 NOTE — Progress Notes (Signed)
Subjective: No new complaints   Antibiotics:  Anti-infectives    Start     Dose/Rate Route Frequency Ordered Stop   02/20/16 1600  DAPTOmycin (CUBICIN) 500 mg in sodium chloride 0.9 % IVPB     500 mg 220 mL/hr over 30 Minutes Intravenous Every 24 hours 02/20/16 1524     02/16/16 0000  piperacillin-tazobactam (ZOSYN) IVPB 3.375 g  Status:  Discontinued     3.375 g 12.5 mL/hr over 240 Minutes Intravenous Every 8 hours 02/15/16 1759 02/16/16 1531   02/15/16 1900  doxycycline (VIBRAMYCIN) 100 mg in dextrose 5 % 250 mL IVPB  Status:  Discontinued     100 mg 125 mL/hr over 120 Minutes Intravenous Every 12 hours 02/15/16 1838 02/16/16 1531   02/15/16 1830  piperacillin-tazobactam (ZOSYN) IVPB 3.375 g     3.375 g 100 mL/hr over 30 Minutes Intravenous  Once 02/15/16 1759 02/15/16 1848      Medications: Scheduled Meds: . baclofen  20 mg Oral TID  . DAPTOmycin (CUBICIN)  IV  500 mg Intravenous Q24H  . diazepam  5 mg Oral 2 times per day  . diazepam  7.5 mg Oral QHS  . enoxaparin (LOVENOX) injection  40 mg Subcutaneous Q24H  . gabapentin  300 mg Oral BID WC  . gabapentin  600 mg Oral QHS  . lamoTRIgine  125 mg Oral Daily  . pantoprazole  40 mg Oral Daily  . sertraline  100 mg Oral Daily   Continuous Infusions: PRN Meds:.acetaminophen, bisacodyl, fluticasone, loratadine, naproxen sodium, ondansetron **OR** ondansetron (ZOFRAN) IV, senna-docusate, triamcinolone 0.1 % cream : eucerin    Objective: Weight change:   Intake/Output Summary (Last 24 hours) at 02/21/16 1812 Last data filed at 02/21/16 1430  Gross per 24 hour  Intake              240 ml  Output             1125 ml  Net             -885 ml   Blood pressure 112/68, pulse (!) 101, temperature 97.4 F (36.3 C), temperature source Oral, resp. rate 18, height 5\' 3"  (1.6 m), weight 144 lb 13.5 oz (65.7 kg), last menstrual period 02/19/2016, SpO2 100 %. Temp:  [97.4 F (36.3 C)-98.3 F (36.8 C)] 97.4 F (36.3  C) (11/21 0853) Pulse Rate:  [88-101] 101 (11/21 1438) Resp:  [18] 18 (11/21 1438) BP: (93-112)/(50-76) 112/68 (11/21 1438) SpO2:  [99 %-100 %] 100 % (11/21 1438) Weight:  [144 lb 13.5 oz (65.7 kg)] 144 lb 13.5 oz (65.7 kg) (11/21 0200)  Physical Exam: General: Alert and awake, oriented x3, not in any acute distress. HEENT: anicteric sclera,  EOMI,  Cardiovascular: regular rate, normal r,   Pulmonary:  no wheezing, or respiratory distress Gastrointestinal: soft , nondistended,  Skin, soft tissue: Acute results was not examined today by me Neuro: Paraplegic with poor ability to use her left hand as   CBC: CBC Latest Ref Rng & Units 02/20/2016 02/16/2016 02/15/2016  WBC 4.0 - 10.5 K/uL 6.2 8.6 10.7(H)  Hemoglobin 12.0 - 15.0 g/dL 10.0(L) 10.2(L) 11.6(L)  Hematocrit 36.0 - 46.0 % 32.6(L) 32.5(L) 37.1  Platelets 150 - 400 K/uL 274 258 293      BMET No results for input(s): NA, K, CL, CO2, GLUCOSE, BUN, CREATININE, CALCIUM in the last 72 hours.   Liver Panel  No results for input(s): PROT, ALBUMIN, AST, ALT, ALKPHOS,  BILITOT, BILIDIR, IBILI in the last 72 hours.     Sedimentation Rate No results for input(s): ESRSEDRATE in the last 72 hours. C-Reactive Protein No results for input(s): CRP in the last 72 hours.  Micro Results: Recent Results (from the past 720 hour(s))  Urine culture     Status: Abnormal   Collection Time: 01/29/16  1:44 PM  Result Value Ref Range Status   Specimen Description URINE, CLEAN CATCH  Final   Special Requests NONE  Final   Culture MULTIPLE SPECIES PRESENT, SUGGEST RECOLLECTION (A)  Final   Report Status 01/31/2016 FINAL  Final  Culture, blood (routine x 2)     Status: None   Collection Time: 02/15/16  3:56 PM  Result Value Ref Range Status   Specimen Description BLOOD BLOOD RIGHT FOREARM  Final   Special Requests BOTTLES DRAWN AEROBIC AND ANAEROBIC 5 CC EA  Final   Culture   Final    NO GROWTH 5 DAYS Performed at [facility]      Report Status 02/20/2016 FINAL  Final  Urine culture     Status: Abnormal   Collection Time: 02/15/16  3:59 PM  Result Value Ref Range Status   Specimen Description URINE, RANDOM  Final   Special Requests NONE  Final   Culture MULTIPLE SPECIES PRESENT, SUGGEST RECOLLECTION (A)  Final   Report Status 02/16/2016 FINAL  Final  Culture, blood (routine x 2)     Status: None   Collection Time: 02/15/16  4:10 PM  Result Value Ref Range Status   Specimen Description BLOOD LEFT HAND  Final   Special Requests IN PEDIATRIC BOTTLE [place]  Final   Culture   Final    NO GROWTH 5 DAYS Performed at [facility]    Report Status 02/20/2016 FINAL  Final  Aerobic/Anaerobic Culture (surgical/deep wound)     Status: None (Preliminary result)   Collection Time: 02/20/16  1:46 PM  Result Value Ref Range Status   Specimen Description BONE NEEDLE CORE LEFT  Final   Special Requests NONE  Final   Gram Stain   Final    FEW WBC PRESENT,BOTH PMN AND MONONUCLEAR NO ORGANISMS SEEN    Culture   Final    NO GROWTH < 24 HOURS Performed at [facility]    Report Status PENDING  Incomplete    Studies/Results: Ct Aspiration  Result Date: 02/20/2016 CLINICAL DATA:  Decubitus ulcer with suggestion of osteomyelitis involving the the left ischium on MRI. EXAM: CT GUIDED CORE BIOPSY OF LEFT ISCHIUM ANESTHESIA/SEDATION: Intravenous Fentanyl and Versed were administered as conscious sedation during continuous monitoring of the patient's level of consciousness and physiological / cardiorespiratory status by the radiology RN, with a total moderate sedation time of 21 minutes. PROCEDURE: The procedure risks, benefits, and alternatives were explained to the patient. Questions regarding the procedure were encouraged and answered. The patient understands and consents to the procedure. Patient placed prone. Select axial scans through the pelvis obtained the, an appropriate skin entry site was identified and marked.  The operative field was prepped with chlorhexidinein a sterile fashion, and a sterile drape was applied covering the operative field. A sterile gown and sterile gloves were used for the procedure. Local anesthesia was provided with 1% Lidocaine. Under CT fluoroscopic guidance, a 11 gauge Cook bone trocar needle was advanced to the margin of the lesion. Once needle tip position was confirmed, core biopsy samples were obtained, submitted in nonbacteriostatic saline to microbiology. The guide needle  was removed. Postprocedure scans show no hemorrhage or other apparent complication. The patient tolerated the procedure well. COMPLICATIONS: None immediate FINDINGS: Sclerosis in the left ischium and ischial tuberosity was identified. Representative core biopsy samples were obtained under CT fluoroscopic guidance. IMPRESSION: 1. Technically successful core biopsy of left ischium, sent for microbiology analysis. Electronically Signed   By: Lucrezia Europe M.D.   On: 02/20/2016 16:40      Assessment/Plan:  INTERVAL HISTORY: Has had bone biopsy for culture performed by interventional radiology today.  No growth on cultures to date she is tolerating daptomycin  Active Problems:   Paraplegia (HCC)   Hypokalemia   Infected decubitus ulcer   Osteomyelitis of pelvis ([place])   Myositis    [name] is a 35 y.o. 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 She is status post biopsy for culture today  #1 Pelvic osteomyelitis:  Past postbiopsy follow culture results  I'm starting daptomycin and hold off on adding gram-negative therapy for now    We can treat her for 6-8 weeks of parenteral antibiotics  If she fails to grow an organism I would recommend placing her either on one of the 2 following antibiotic  regimens  Option a IV daptomycin aggressively dose for bone infection plus intravenous ceftriaxone 2 g daily  Option b IV TEFLARO  Vancomycin is off the table due to her allergy to this. I would ask case management to compare prices of these 2 different regimens.    As mentioned also wonder if she would benefit from a diverting colostomy to protect her wound  LOS: 6 days   Alcide Evener 02/21/2016, 6:12 PM

## 2016-02-22 ENCOUNTER — Ambulatory Visit: Payer: Medicaid Other | Admitting: Neurology

## 2016-02-22 DIAGNOSIS — M869 Osteomyelitis, unspecified: Principal | ICD-10-CM

## 2016-02-22 DIAGNOSIS — E876 Hypokalemia: Secondary | ICD-10-CM

## 2016-02-22 DIAGNOSIS — L089 Local infection of the skin and subcutaneous tissue, unspecified: Secondary | ICD-10-CM

## 2016-02-22 DIAGNOSIS — L8990 Pressure ulcer of unspecified site, unspecified stage: Secondary | ICD-10-CM

## 2016-02-22 MED ORDER — DEXTROSE 5 % IV SOLN
2.0000 g | INTRAVENOUS | Status: DC
  Administered 2016-02-22 – 2016-02-25 (×4): 2 g via INTRAVENOUS
  Filled 2016-02-22 (×5): qty 2

## 2016-02-22 NOTE — Consult Note (Signed)
[facility] Surgery Consult Note  [name] 1980/06/03  [id number].    Requesting MD: Erlinda Hong, MD Chief Complaint/Reason for Consult: sacral ulcer  HPI:  35 y.o. 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 She has a chronic draining urostomy of the right lower quadrant. Her sacral ulcer is followed by the wound care clinic and she has a chronic wound vac. Patient reports intermittent, low-grade fevers this past month that have been temporarily relieved with keflex. She was also seen the ED recently, 01/30/16 for flu-like illness s/p flu shot and treated with amoxicillin and zofran. The patient was admitted to the hospital on 02/15/16 when her [facility] RN noticed worsening drainage from her sacral wound, worsening fever, and associated nausea, diaphoresis, and chills. MRI was performed revealing osteomyelitis in the posterior left acetabulum, ischial tuberosity and inferior pubic ramus. Core bone biopsy performed by IR on 11/20, cultures pending (currently no growth). ID managing abx and patient will be on 4-8 weeks IV abx treatment. General surgery has been asked to consult regarding diverting colostomy.   Past abdominal/pelvic surgeries include cholecystectomy and cystectomy with ureteroileal conduit - reports a significant amount of trouble under general anesthesia.but is unable to give specific details about intra or post-operative complications. Reports she had her gallbladder removed while "not being totally under". Denies use of blood thinning medications. Drug allergies include ASA and vancomycin.  ROS: All systems reviewed and otherwise negative except for as above  Family History  Problem Relation Age of Onset  . Diabetes type II Father   . CAD Father   . Dementia Father   . Stroke Father   . Seizures Father   . Diabetes type II Mother   . Hypertension Mother    . Breast cancer Other   . Diabetes type II Other     Past Medical History:  Diagnosis Date  . Bladder stones    Hx of  . Depression   . GERD (gastroesophageal reflux disease)   . Multiple sclerosis ([place])   . Paraplegia ([place])   . PTSD (post-traumatic stress disorder)   . Transverse myelitis [facility])     Past Surgical History:  Procedure Laterality Date  . CHOLECYSTECTOMY    . CYSTECTOMY W/ URETEROILEAL CONDUIT    . TONSILLECTOMY    . WISDOM TOOTH EXTRACTION      Social History:  reports that she has never smoked. She has never used smokeless tobacco. She reports that she does not drink alcohol or use drugs.  Allergies:  Allergies  Allergen Reactions  . Iodine Anaphylaxis  . Latex Anaphylaxis  . Other Anaphylaxis    ALL -MYCINS. Pt states she has had all "-mycins" in all classes and all of them cause reactions.    Marland Kitchen Shellfish-Derived Products Anaphylaxis    Reaction unknown  . Vancomycin Anaphylaxis  . Aspirin Other (See Comments)    Reaction unknown  . Strawberry Extract Hives and Rash    Medications Prior to Admission  Medication Sig Dispense Refill  . acetaminophen (TYLENOL) 325 MG tablet Take 650 mg by mouth every 6 (six) hours as needed for fever.    . baclofen (LIORESAL) 20 MG tablet Take 20 mg by mouth 3 (three) times daily.    . bisacodyl (DULCOLAX) 10 MG suppository Place 1 suppository (10 mg total) rectally daily as needed for moderate constipation. 12 suppository 0  . diazepam (VALIUM) 5 MG  tablet Take 1-2 tablets (5-10 mg total) by mouth every 8 (eight) hours. Take 1 tablet in AM, 1 tablet in PM, and 2 tablets at bedtime (Patient taking differently: Take 5-10 mg by mouth 3 (three) times daily. Take 1 tablet in AM, 1 tablet in PM, and 2 tablets at bedtime) 30 tablet 0  . fluticasone (FLONASE) 50 MCG/ACT nasal spray Place 2 sprays into the nose daily as needed for allergies.     Marland Kitchen gabapentin (NEURONTIN) 300 MG capsule Take 2 capsules (600 mg total) by mouth 3  (three) times daily. 180 capsule 5  . ibuprofen (ADVIL,MOTRIN) 200 MG tablet Take 400 mg by mouth every 6 (six) hours as needed for headache, mild pain or moderate pain.     Marland Kitchen lamoTRIgine (LAMICTAL) 25 MG tablet Take 125 mg by mouth daily.    Marland Kitchen loratadine (CLARITIN) 5 MG/5ML syrup Take 5 mg by mouth daily.    Marland Kitchen lubiprostone (AMITIZA) 24 MCG capsule Take 24 mcg by mouth 2 (two) times daily with a meal.    . naproxen sodium (ANAPROX) 220 MG tablet Take 220-440 mg by mouth 3 (three) times daily as needed (migraine/headache).    Marland Kitchen omeprazole (PRILOSEC) 40 MG capsule Take 40 mg by mouth daily.    . ondansetron (ZOFRAN ODT) 4 MG disintegrating tablet 4mg  ODT q4 hours prn nausea/vomit 20 tablet 0  . senna-docusate (SENOKOT-S) 8.6-50 MG per tablet Take 1 tablet by mouth 2 (two) times daily. (Patient taking differently: Take 1 tablet by mouth 2 (two) times daily as needed for mild constipation or moderate constipation. ) 30 tablet 0  . sertraline (ZOLOFT) 100 MG tablet Take 100 mg by mouth daily.      Blood pressure 123/73, pulse 99, temperature 98.1 F (36.7 C), temperature source Oral, resp. rate 17, height 5\' 3"  (1.6 m), weight 144 lb 13.5 oz (65.7 kg), last menstrual period 02/19/2016, SpO2 98 %. Physical Exam: General: pleasant, overweight white female who is laying in bed in obvious emotional distress, mother in the room. HEENT: head is normocephalic, atraumatic. Poor dentition.  Heart: regular, rate, and rhythm.  No obvious murmurs, gallops, or rubs noted.   Lungs: CTAB, no wheezes, rhonchi, or rales noted.  Respiratory effort nonlabored Abd: soft, NT/ND, +BS, RLQ urostomy c/d/i MS: all 4 extremities are symmetrical with no cyanosis, clubbing, or edema. Skin: warm and dry -sacral ulcer not examined as patient was tearful and I did not feel an invasive exam was appropriate. Psych: A&Ox3 with an appropriate affect. 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   I discussed the option of a diverting colostomy with the patient and her mother - both of whom were very emotional during the conversation, stating that they have been through a lot this year, including the death of a family member, and they are unsure that they can handle "one more thing". I discussed that the primary indication for a diverting colostomy was to keep the sacral wound from being continuously contaminated. I answered all patient questions about the colostomy and pouching system, although overall the patient and her mother were relatively resistant to specific information regarding the procedure and recovery. The mother is concerned about managing a ostomy pouch with her arthritis - I offered a more detailed consult about the pouch with [place] nurses and the patient declined. At the end  of our discussion the patient stated that she understood the indication for a diverting colostomy, however, she feels that the wound still has a chance to heal without the operation and she has a bowel regimen that works for her so she declined surgery at this time.   Previous notes regarding trouble undergoing general anesthesia to be reviewed.  Jill Alexanders, [facility] Surgery 02/22/2016, 3:44 PM Pager: [phone] Consults: [phone] Mon-Fri 7:00 am-4:30 pm Sat-Sun 7:00 am-11:30 am

## 2016-02-22 NOTE — Progress Notes (Signed)
Pharmacy Antibiotic Note  [name] is a 35 y.o. female with history of transverse myelitis and MS with paraplegia and chronic stage IV decubitus ulcers, also s/p diverting urostomy admitted on 02/15/2016 with worsening decubitus ulcer and osteomyelitis involving left acetabulum extending into inferior pubic ramus and ischium. Pt initially treated with Zosyn and doxycycline which were stopped for bone biopsy on 11/20.  ID following,  consulted pharmacy to start daptomycin.  Pt chart lists allergy to vancomycin (anaphylaxis).   Last charted weight = 65.7 kg (11/21) CrCl estimated ~90 with rounded SCr 0.8  Plan: Daptomycin 500mg  IV q24h (~7.6mg /kg)  F/u CK weekly (next due 11/24) F/u renal function, cultures, clinical course Unless able to target therapy based upon bone culture results, may need empiric addition of gram-negative coverage according to ID physician.  May need 6 to 8 weeks IV abx. Await results from case management research on outpatient costs of two potential regimens:  daptomycin 500 mg IV daily + ceftriaxone 2 g IV daily  ceftaroline 600mg  IV q12h    Temp (24hrs), Avg:98.4 F (36.9 C), Min:98.3 F (36.8 C), Max:98.4 F (36.9 C)   Recent Labs Lab 02/15/16 1548 02/15/16 1928 02/16/16 0105 02/17/16 0506 02/20/16 0417  WBC 10.7*  --  8.6  --  6.2  CREATININE 0.41*  --  0.37* 0.30*  --   LATICACIDVEN  --  1.0 0.7  --   --     Estimated Creatinine Clearance: 89.4 mL/min (by C-G formula based on SCr of 0.3 mg/dL (L)).    Allergies  Allergen Reactions  . Iodine Anaphylaxis  . Latex Anaphylaxis  . Other Anaphylaxis    ALL -MYCINS. Pt states she has had all "-mycins" in all classes and all of them cause reactions.    Marland Kitchen Shellfish-Derived Products Anaphylaxis    Reaction unknown  . Vancomycin Anaphylaxis  . Aspirin Other (See Comments)    Reaction unknown  . Strawberry Extract Hives and Rash    Antimicrobials this admission: Zosyn 11/15 >>  11/16 Doxycycline (MD) 11/15 >> 11/16 Daptomycin 11/20 >>  Dose adjustments this admission: ---  Microbiology results: 11/15 BCx: NGF 11/15 UCx: Multiple species present, lab suggests recollection 11/20 Bone Cx: ngtd  Thank you for allowing pharmacy to be a part of this patient's care.  Clayburn Pert, PharmD, BCPS Pager: [phone] 02/22/2016  10:42 AM

## 2016-02-22 NOTE — Progress Notes (Signed)
Subjective: No new complaints   Antibiotics:  Anti-infectives    Start     Dose/Rate Route Frequency Ordered Stop   02/22/16 1200  cefTRIAXone (ROCEPHIN) 2 g in dextrose 5 % 50 mL IVPB     2 g 100 mL/hr over 30 Minutes Intravenous Every 24 hours 02/22/16 1057     02/20/16 1600  DAPTOmycin (CUBICIN) 500 mg in sodium chloride 0.9 % IVPB     500 mg 220 mL/hr over 30 Minutes Intravenous Every 24 hours 02/20/16 1524     02/16/16 0000  piperacillin-tazobactam (ZOSYN) IVPB 3.375 g  Status:  Discontinued     3.375 g 12.5 mL/hr over 240 Minutes Intravenous Every 8 hours 02/15/16 1759 02/16/16 1531   02/15/16 1900  doxycycline (VIBRAMYCIN) 100 mg in dextrose 5 % 250 mL IVPB  Status:  Discontinued     100 mg 125 mL/hr over 120 Minutes Intravenous Every 12 hours 02/15/16 1838 02/16/16 1531   02/15/16 1830  piperacillin-tazobactam (ZOSYN) IVPB 3.375 g     3.375 g 100 mL/hr over 30 Minutes Intravenous  Once 02/15/16 1759 02/15/16 1848      Medications: Scheduled Meds: . baclofen  20 mg Oral TID  . cefTRIAXone (ROCEPHIN)  IV  2 g Intravenous Q24H  . DAPTOmycin (CUBICIN)  IV  500 mg Intravenous Q24H  . diazepam  5 mg Oral 2 times per day  . diazepam  7.5 mg Oral QHS  . enoxaparin (LOVENOX) injection  40 mg Subcutaneous Q24H  . gabapentin  300 mg Oral BID WC  . gabapentin  600 mg Oral QHS  . lamoTRIgine  125 mg Oral Daily  . pantoprazole  40 mg Oral Daily  . sertraline  100 mg Oral Daily   Continuous Infusions: PRN Meds:.acetaminophen, bisacodyl, fluticasone, loratadine, naproxen sodium, ondansetron **OR** ondansetron (ZOFRAN) IV, senna-docusate, triamcinolone 0.1 % cream : eucerin    Objective: Weight change:   Intake/Output Summary (Last 24 hours) at 02/22/16 1531 Last data filed at 02/22/16 0612  Gross per 24 hour  Intake              480 ml  Output              250 ml  Net              230 ml   Blood pressure 106/71, pulse 92, temperature 98.3 F (36.8 C),  temperature source Oral, resp. rate 17, height 5' 3"  (1.6 m), weight 144 lb 13.5 oz (65.7 kg), last menstrual period 02/19/2016, SpO2 97 %. Temp:  [98.3 F (36.8 C)-98.4 F (36.9 C)] 98.3 F (36.8 C) (11/22 0609) Pulse Rate:  [87-92] 92 (11/22 0609) Resp:  [17-18] 17 (11/22 0609) BP: (106-119)/(71-77) 106/71 (11/22 0609) SpO2:  [97 %-98 %] 97 % (11/22 4034)  Physical Exam: General: Alert and awake, oriented x3, not in any acute distress. HEENT: anicteric sclera,  EOMI,  Cardiovascular: regular rate, normal r,   Pulmonary:  no wheezing, or respiratory distress Gastrointestinal: soft , nondistended,  Skin, soft tissue: Acute results was not examined today by me Neuro: Paraplegic with poor ability to use her left hand as   CBC: CBC Latest Ref Rng & Units 02/20/2016 02/16/2016 02/15/2016  WBC 4.0 - 10.5 K/uL 6.2 8.6 10.7(H)  Hemoglobin 12.0 - 15.0 g/dL 10.0(L) 10.2(L) 11.6(L)  Hematocrit 36.0 - 46.0 % 32.6(L) 32.5(L) 37.1  Platelets 150 - 400 K/uL 274 258 293      BMET  No results for input(s): NA, K, CL, CO2, GLUCOSE, BUN, CREATININE, CALCIUM in the last 72 hours.   Liver Panel  No results for input(s): PROT, ALBUMIN, AST, ALT, ALKPHOS, BILITOT, BILIDIR, IBILI in the last 72 hours.     Sedimentation Rate No results for input(s): ESRSEDRATE in the last 72 hours. C-Reactive Protein No results for input(s): CRP in the last 72 hours.  Micro Results: Recent Results (from the past 720 hour(s))  Urine culture     Status: Abnormal   Collection Time: 01/29/16  1:44 PM  Result Value Ref Range Status   Specimen Description URINE, CLEAN CATCH  Final   Special Requests NONE  Final   Culture MULTIPLE SPECIES PRESENT, SUGGEST RECOLLECTION (A)  Final   Report Status 01/31/2016 FINAL  Final  Culture, blood (routine x 2)     Status: None   Collection Time: 02/15/16  3:56 PM  Result Value Ref Range Status   Specimen Description BLOOD BLOOD RIGHT FOREARM  Final   Special Requests  BOTTLES DRAWN AEROBIC AND ANAEROBIC 5 CC EA  Final   Culture   Final    NO GROWTH 5 DAYS Performed at [facility]    Report Status 02/20/2016 FINAL  Final  Urine culture     Status: Abnormal   Collection Time: 02/15/16  3:59 PM  Result Value Ref Range Status   Specimen Description URINE, RANDOM  Final   Special Requests NONE  Final   Culture MULTIPLE SPECIES PRESENT, SUGGEST RECOLLECTION (A)  Final   Report Status 02/16/2016 FINAL  Final  Culture, blood (routine x 2)     Status: None   Collection Time: 02/15/16  4:10 PM  Result Value Ref Range Status   Specimen Description BLOOD LEFT HAND  Final   Special Requests IN PEDIATRIC BOTTLE 2CC  Final   Culture   Final    NO GROWTH 5 DAYS Performed at [facility]    Report Status 02/20/2016 FINAL  Final  Aerobic/Anaerobic Culture (surgical/deep wound)     Status: None (Preliminary result)   Collection Time: 02/20/16  1:46 PM  Result Value Ref Range Status   Specimen Description BONE NEEDLE CORE LEFT  Final   Special Requests NONE  Final   Gram Stain   Final    FEW WBC PRESENT,BOTH PMN AND MONONUCLEAR NO ORGANISMS SEEN    Culture   Final    NO GROWTH 2 DAYS Performed at [facility]    Report Status PENDING  Incomplete    Studies/Results: No results found.    Assessment/Plan:  INTERVAL HISTORY: Has had bone biopsy for culture performed by interventional radiology today.  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 She is status post biopsy for culture today  #1 Pelvic  osteomyelitis:  Past postbiopsy follow culture results  I'm starting daptomycin and Adding ceftriaxone since no growth of any organisms  We can plan on treating her summer between 4 and 8 weeks of parenteral antibiotics depending on how she doesn't have much can be covered by her insurance company  Following is the tentative plan for IV antibiotics provided no organism is isolated which we suspect will be the case and provided her insurance can cover for her medications due to her vancomycin allergy.  Diagnosis: Pelvic osteomyelitis  Culture Result: No growth  Allergies  Allergen Reactions  . Iodine Anaphylaxis  . Latex Anaphylaxis  . Other Anaphylaxis    ALL -MYCINS. Pt states she has had all "-mycins" in all classes and all of them cause reactions.    Marland Kitchen Shellfish-Derived Products Anaphylaxis    Reaction unknown  . Vancomycin Anaphylaxis  . Aspirin Other (See Comments)    Reaction unknown  . Strawberry Extract Hives and Rash    Discharge antibiotics:Daptomycin per pharmacy and ceftriaxone 2 g daily Per pharmacy protocol daptomycin  Duration: 6 weeks End Date:  January 1st, 2018  [facility] Care Per Protocol:  Labs weekly while on IV antibiotics: _x_ CBC with differential x__ BMP _X_ CPK _X_ ESR   __ Please pull PIC at completion of IV antibiotics X__ Please leave PIC in place until doctor has seen patient or been notified  Fax weekly labs to [phone]  Clinic Follow Up Appt:  Next 3 weeks       LOS: 7 days   Alcide Evener 02/22/2016, 3:31 PM

## 2016-02-22 NOTE — Progress Notes (Addendum)
[id number] Sam Overbeck,BSN,RN3,CCM: Per the Medicaid formulary site -Teflaro is covered has a unclassified drupg and use code [employer] J3490 to bill. Daptomycin is not covered. [id number] -PM:[id number] with [facility]-informed that Teflaro is covered and might be used. Pam will follow up.

## 2016-02-22 NOTE — Progress Notes (Signed)
PROGRESS NOTE  [name]  [id number] DOB: [birth year] DOA: 02/15/2016 PCP: Simona Huh, MD  Brief Narrative:    [name] is a 35 y.o. 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  She has a stage 4 sacral ulcer which is followed in the wound care clinic by Dr. Dellia Nims and she has a chronic wound vac.  For the last month, she has had intermittent fevers which have been temporarily suppressed by short courses of oral antibiotics.  After her last course of antibiotics, she developed fever up to 101.75F with associated nausea, chills, sweats, and her home health RN noticed increased erythema and purulence from her chronic sacral wound so she presented to the ER.  In the ER, VS notable for T = 101.80F, tachycardic to the low 100s and mildly hypotensive.  WBC 10.7.  MRI of the sacrum demonstrated osteomyelitis of the posterior left acetabulum, ischial tuberosity, and inferior pubic ramus with myositis of the the left pelvis and upper leg.  No discrete abscess.  Due to concern for sepsis, she received initial antibiotics which were stopped the next day when her blood pressure stabilized and lactate remained normal.  She remained off of antibiotics for 4 days prior to bone biopsy which was performed on 11/20 by interventional radiology.  Smear demonstrated no bacteria.  Culture is pending.  She is on empiric daptomycin pending culture data and ID is following.  She may need PICC line for long course of IV antibiotics, but Dr. Tommy Medal asked that we wait for the culture data before placing PICC line because oral fluoroquinolone may still be an option.  Plastic surgery was consulted and recommended that Ms. Westenberger follow up with [facility] for ongoing wound management and eventual flap once her  osteomyelitis is cured and her prealbumin is greater than 16.  She would need to lie on her belly for about a month after flap procedure.    Assessment & Plan:   Active Problems:   Paraplegia (HCC)   Hypokalemia   Infected decubitus ulcer   Osteomyelitis of pelvis (HCC)   Myositis  Osteomyelitis of the posterior left acetabulum, ischial tuberosity, and inferior pubic ramus with myositis of the the left pelvis and upper leg.  -  ESR 14, CRP 20.8 >> 2.6 -  CK wnl -  Blood culture NGTD -  Wound care /plastic surgery stated patient is not ready for flap -  Bone biopsy by IR performed on 11/20, culture NGTD -  abx per infectious disease -  continue Zinc, vit C, MVI General surgery consulted for sacral wound management (patient reported she was seen at the wound care center and has a wound vac on for a few weeks, then developed fever and sepsis and had to be admitted to the hospital)and to discuss diverting colostomy  Hypokalemia resolved with oral and IV supplementation  Nausea, vomiting, and diarrhea, likely due to underlying infection and resolved with IVF and antibiotics  Paraplegia due to history of transverse myelitis  Normocytic anemia, likely due to inflammation from osteomyelitis -  No need for blood transfusion  Sleepy/somnolent, patient asking to reduce her medications gradually while in hospital so that she is more lucid during the day.  Feels overmedicated. -  reduced dose of lunchtime gabapentin today -  Continue valium  at 7.57m qhs (reduced on 11/20)   Muscle spasms -  Continue baclofen  HIV, HCV and hepatitis B negative  Severe protein calorie malnutrition, prealbumin 6.1 -  Needs prealbumin to be at least 16 before she would be a candidate for definitive surgery  DVT prophylaxis while in the hospital:  lovenox  Code Status: full Family Communication: patient and her mother  Disposition Plan:  Likely to home with home care and  in care of mother    Consultants:   Wound care/Plastic surgery  ID, Dr. VTommy Medal IR for bone biopsy  General surgery  Procedures:  Bone biopsy 11/20   Antimicrobials:   Vancomycin and doxycycline 11/15 - 11/16 (on hold for possible biopsy)  daptomycin from 11/20  rocephin from 11/22   Subjective:  Having daily stools.  More alert today and feeling well, but still napping frequently during the day.   No fever, mother at bedside  Objective: Vitals:   02/21/16 0853 02/21/16 1438 02/21/16 2000 02/22/16 0609  BP: (!) 104/59 112/68 119/77 106/71  Pulse: 90 (!) 101 87 92  Resp: _0 Temp: 97.4 F (36.3 C)  98.4 F (36.9 C) 98.3 F (36.8 C)  TempSrc: Oral  Oral Oral  SpO2: 99% 100% 98% 97%  Weight:      Height:        Intake/Output Summary (Last 24 hours) at 02/22/16 1515 Last data filed at 02/22/16 01021 Gross per 24 hour  Intake              480 ml  Output              250 ml  Net              230 ml   Filed Weights   02/21/16 0200  Weight: 65.7 kg (144 lb 13.5 oz)    Examination:  General exam:  Adult female.  No acute distress.  Awake and alert today  HEENT:  NCAT, MMM Respiratory system: Clear to auscultation bilaterally Cardiovascular system:  RRR.  No murmurs, rubs, gallops or clicks.  Warm extremities Gastrointestinal system: Normal active bowel sounds, soft, nondistended, nontender.  Urostomy with dark urine MSK:  Flaccid legs, foot contractures.  Trace bilateral lower extremity edema.  Clubbed digits Neuro:  Paraplegic from waist down    Data Reviewed: I have personally reviewed following labs and imaging studies  CBC:  Recent Labs Lab 02/15/16 1548 02/16/16 0105 02/20/16 0417  WBC 10.7* 8.6 6.2  NEUTROABS 9.5*  --  3.9  HGB 11.6* 10.2* 10.0*  HCT 37.1 32.5* 32.6*  MCV 82.1 82.3 84.7  PLT 293 258 2117  Basic Metabolic Panel:  Recent Labs Lab 02/15/16 1548 02/16/16 0105 02/17/16 0506  NA 137 136 141  K 2.9* 2.7* 4.3  CL 98* 102 108   CO2 _1 GLUCOSE 92 98 106*  BUN 9 6 5*  CREATININE 0.41* 0.37* 0.30*  CALCIUM 8.6* 8.3* 8.1*  MG 2.0  --   --    GFR: Estimated Creatinine Clearance: 89.4 mL/min (by C-G formula based on SCr of 0.3 mg/dL (L)). Liver Function Tests:  Recent Labs Lab 02/15/16 1548  AST 10*  ALT 9*  ALKPHOS 93  BILITOT 1.2  PROT 6.7  ALBUMIN 3.6   No results for input(s): LIPASE, AMYLASE in the last 168 hours. No results for input(s): AMMONIA in the last 168 hours. Coagulation Profile:  Recent Labs Lab 02/15/16 2035  INR 1.15   Cardiac Enzymes:  Recent Labs Lab 02/17/16 0506  CKTOTAL 15*   BNP (last 3 results) No results for input(s): PROBNP in the last 8760 hours. HbA1C: No results for input(s): HGBA1C in the last 72 hours. CBG: No results for input(s): GLUCAP in the last 168 hours. Lipid Profile: No results for input(s): CHOL, HDL, LDLCALC, TRIG, CHOLHDL, LDLDIRECT in the last 72 hours. Thyroid Function Tests: No results for input(s): TSH, T4TOTAL, FREET4, T3FREE, THYROIDAB in the last 72 hours. Anemia Panel: No results for input(s): VITAMINB12, FOLATE, FERRITIN, TIBC, IRON, RETICCTPCT in the last 72 hours. Urine analysis:    Component Value Date/Time   COLORURINE AMBER (A) 02/15/2016 1559   APPEARANCEUR TURBID (A) 02/15/2016 1559   LABSPEC 1.019 02/15/2016 1559   PHURINE 8.5 (H) 02/15/2016 1559   GLUCOSEU NEGATIVE 02/15/2016 1559   HGBUR NEGATIVE 02/15/2016 1559   BILIRUBINUR SMALL (A) 02/15/2016 1559   KETONESUR NEGATIVE 02/15/2016 1559   PROTEINUR 100 (A) 02/15/2016 1559   UROBILINOGEN 2.0 (H) 10/29/2014 1627   NITRITE NEGATIVE 02/15/2016 1559   LEUKOCYTESUR MODERATE (A) 02/15/2016 1559   Sepsis Labs: _0 (procalcitonin:4,lacticidven:4)  ) Recent Results (from the past 240 hour(s))  Culture, blood (routine x 2)     Status: None   Collection Time: 02/15/16  3:56 PM  Result Value Ref Range Status   Specimen Description BLOOD BLOOD RIGHT FOREARM   Final   Special Requests BOTTLES DRAWN AEROBIC AND ANAEROBIC 5 CC EA  Final   Culture   Final    NO GROWTH 5 DAYS Performed at [facility]    Report Status 02/20/2016 FINAL  Final  Urine culture     Status: Abnormal   Collection Time: 02/15/16  3:59 PM  Result Value Ref Range Status   Specimen Description URINE, RANDOM  Final   Special Requests NONE  Final   Culture MULTIPLE SPECIES PRESENT, SUGGEST RECOLLECTION (A)  Final   Report Status 02/16/2016 FINAL  Final  Culture, blood (routine x 2)     Status: None   Collection Time: 02/15/16  4:10 PM  Result Value Ref Range Status   Specimen Description BLOOD LEFT HAND  Final   Special Requests IN PEDIATRIC BOTTLE [place]  Final   Culture   Final    NO GROWTH 5 DAYS Performed at [facility]    Report Status 02/20/2016 FINAL  Final  Aerobic/Anaerobic Culture (surgical/deep wound)     Status: None (Preliminary result)   Collection Time: 02/20/16  1:46 PM  Result Value Ref Range Status   Specimen Description BONE NEEDLE CORE LEFT  Final   Special Requests NONE  Final   Gram Stain   Final    FEW WBC PRESENT,BOTH PMN AND MONONUCLEAR NO ORGANISMS SEEN    Culture   Final    NO GROWTH 2 DAYS Performed at [facility]    Report Status PENDING  Incomplete      Radiology Studies: No results found.   Scheduled Meds: . baclofen  20 mg Oral TID  . cefTRIAXone (ROCEPHIN)  IV  2 g Intravenous Q24H  . DAPTOmycin (CUBICIN)  IV  500 mg Intravenous Q24H  . diazepam  5 mg Oral 2 times per day  . diazepam  7.5 mg Oral QHS  . enoxaparin (LOVENOX) injection  40 mg Subcutaneous Q24H  . gabapentin  300 mg Oral BID WC  . gabapentin  600 mg Oral QHS  . lamoTRIgine  125 mg Oral Daily  .  pantoprazole  40 mg Oral Daily  . sertraline  100 mg Oral Daily   Continuous Infusions:    LOS: 7 days    Time spent: 63 min    Dorthie Santini, MD PhD [facility] Pager [phone]  If 7PM-7AM, please contact  night-coverage [email or web address] Password [facility] 02/22/2016, 3:15 PM

## 2016-02-23 DIAGNOSIS — R159 Full incontinence of feces: Secondary | ICD-10-CM

## 2016-02-23 LAB — CBC
HEMATOCRIT: 32 % — AB (ref 36.0–46.0)
Hemoglobin: 9.7 g/dL — ABNORMAL LOW (ref 12.0–15.0)
MCH: 25.9 pg — ABNORMAL LOW (ref 26.0–34.0)
MCHC: 30.3 g/dL (ref 30.0–36.0)
MCV: 85.3 fL (ref 78.0–100.0)
PLATELETS: 294 10*3/uL (ref 150–400)
RBC: 3.75 MIL/uL — ABNORMAL LOW (ref 3.87–5.11)
RDW: 17.6 % — AB (ref 11.5–15.5)
WBC: 6.4 10*3/uL (ref 4.0–10.5)

## 2016-02-23 LAB — BASIC METABOLIC PANEL
ANION GAP: 8 (ref 5–15)
BUN: 12 mg/dL (ref 6–20)
CALCIUM: 8.8 mg/dL — AB (ref 8.9–10.3)
CO2: 26 mmol/L (ref 22–32)
CREATININE: 0.44 mg/dL (ref 0.44–1.00)
Chloride: 106 mmol/L (ref 101–111)
Glucose, Bld: 96 mg/dL (ref 65–99)
Potassium: 3.9 mmol/L (ref 3.5–5.1)
SODIUM: 140 mmol/L (ref 135–145)

## 2016-02-23 LAB — MAGNESIUM: MAGNESIUM: 2.1 mg/dL (ref 1.7–2.4)

## 2016-02-23 NOTE — Progress Notes (Signed)
PT Note  Patient Details Name: [name] MRN: [id number] DOB: 04-20-1980      PT order received.  Pt and mother state pt is functioning at baseline level and "we have been doing this for 13 years".  Pt and mother expressing concerns regarding continued use of air bed at home as well as need for manual WC that allows off loading of pts ongoing stage 4 sacral ulcer.  Pt has recently (~1 yr ago) received motorized chair which does allow increased flexibility for off loading but state that "the chair is too large to get into the house".  Today is Thanksgiving day.  Will need to contact other resources tomorrow to review possible options to address pt needs.  Perlie Stene 02/23/2016, 4:40 PM

## 2016-02-23 NOTE — Progress Notes (Signed)
PROGRESS NOTE  [name]  [id number] DOB: 1980-10-19 DOA: 02/15/2016 PCP: Simona Huh, MD  Brief Narrative:    [name] is a 35 y.o. 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  She has a stage 4 sacral ulcer which is followed in the wound care clinic by Dr. Dellia Nims and she has a chronic wound vac.  For the last month, she has had intermittent fevers which have been temporarily suppressed by short courses of oral antibiotics.  After her last course of antibiotics, she developed fever up to 101.20F with associated nausea, chills, sweats, and her home health RN noticed increased erythema and purulence from her chronic sacral wound so she presented to the ER.  In the ER, VS notable for T = 101.35F, tachycardic to the low 100s and mildly hypotensive.  WBC 10.7.  MRI of the sacrum demonstrated osteomyelitis of the posterior left acetabulum, ischial tuberosity, and inferior pubic ramus with myositis of the the left pelvis and upper leg.  No discrete abscess.  Due to concern for sepsis, she received initial antibiotics which were stopped the next day when her blood pressure stabilized and lactate remained normal.  She remained off of antibiotics for 4 days prior to bone biopsy which was performed on 11/20 by interventional radiology.  Smear demonstrated no bacteria.  Culture is pending.  She is on empiric daptomycin pending culture data and ID is following.  She may need PICC line for long course of IV antibiotics, but Dr. Tommy Medal asked that we wait for the culture data before placing PICC line because oral fluoroquinolone may still be an option.  Plastic surgery was consulted and recommended that [name] follow up with [facility] for ongoing wound management and eventual flap once her  osteomyelitis is cured and her prealbumin is greater than 16.  She would need to lie on her belly for about a month after flap procedure.    Assessment & Plan:   Active Problems:   Paraplegia (HCC)   Multiple sclerosis (HCC)   History of transverse myelitis   Hypokalemia   Infected decubitus ulcer   Osteomyelitis of pelvis (HCC)   Myositis   Fecal incontinence  Osteomyelitis of the posterior left acetabulum, ischial tuberosity, and inferior pubic ramus with myositis of the the left pelvis and upper leg.  -  ESR 14, CRP 20.8 >> 2.6 -  CK wnl -  Blood culture NGTD -  Wound care /plastic surgery stated patient is not ready for flap -  Bone biopsy by IR performed on 11/20, culture NGTD -  abx per infectious disease -  continue Zinc, vit C, MVI General surgery consulted for sacral wound management (patient reported she was seen at the wound care center and has a wound vac on for a few weeks, then developed fever and sepsis and had to be admitted to the hospital)and to discuss diverting colostomy Family declined surgery currently  Hypokalemia resolved with oral and IV supplementation  Nausea, vomiting, and diarrhea, likely due to underlying infection and resolved with IVF and antibiotics  Paraplegia due to history of transverse myelitis  Normocytic anemia, likely due to inflammation from osteomyelitis -  No need for blood transfusion  Sleepy/somnolent, patient asking to reduce her medications gradually while in hospital so that she is more  lucid during the day.  Feels overmedicated. -  reduced dose of lunchtime gabapentin today -  Continue valium at 7.89m qhs (reduced on 11/20)  -improving, more alert  Muscle spasms -  Continue baclofen  HIV, HCV and hepatitis B negative  Severe protein calorie malnutrition, prealbumin 6.1 -  Needs prealbumin to be at least 16 before she would be a candidate for definitive surgery  DVT prophylaxis while in the hospital:  lovenox  Code  Status: full Family Communication: patient and her mother  Disposition Plan:  Likely to home with home care and  in care of mother with infectious disease clearance  Consultants:   Wound care/Plastic surgery  ID, Dr. VTommy Medal IR for bone biopsy  General surgery  Procedures:  Bone biopsy 11/20   Antimicrobials:   Vancomycin and doxycycline 11/15 - 11/16 (on hold for possible biopsy)  daptomycin from 11/20  rocephin from 11/22   Subjective:    More alert today and feeling well,   No fever, mother at bedside  Objective: Vitals:   02/22/16 0609 02/22/16 1536 02/22/16 2218 02/23/16 0610  BP: 106/71 123/73 117/74 120/70  Pulse: 92 99 (!) 105 86  Resp: _0 Temp: 98.3 F (36.8 C) 98.1 F (36.7 C) 98.5 F (36.9 C) 97.7 F (36.5 C)  TempSrc: Oral Oral Oral Oral  SpO2: 97% 98% 100% 97%  Weight:      Height:        Intake/Output Summary (Last 24 hours) at 02/23/16 0747 Last data filed at 02/23/16 0610  Gross per 24 hour  Intake              510 ml  Output              650 ml  Net             -140 ml   Filed Weights   02/21/16 0200  Weight: 65.7 kg (144 lb 13.5 oz)    Examination:  General exam:  Adult female.  No acute distress.  Awake and alert today  HEENT:  NCAT, MMM Respiratory system: Clear to auscultation bilaterally Cardiovascular system:  RRR.  No murmurs, rubs, gallops or clicks.  Warm extremities Gastrointestinal system: Normal active bowel sounds, soft, nondistended, nontender.  Urostomy with dark urine MSK:  Flaccid legs, foot contractures.  Trace bilateral lower extremity edema.  Clubbed digits Neuro:  Paraplegic from waist down    Data Reviewed: I have personally reviewed following labs and imaging studies  CBC:  Recent Labs Lab 02/20/16 0417 02/23/16 0527  WBC 6.2 6.4  NEUTROABS 3.9  --   HGB 10.0* 9.7*  HCT 32.6* 32.0*  MCV 84.7 85.3  PLT 274 2409  Basic Metabolic Panel:  Recent Labs Lab 02/17/16 0506  02/23/16 0527  NA 141 140  K 4.3 3.9  CL 108 106  CO2 27 26  GLUCOSE 106* 96  BUN 5* 12  CREATININE 0.30* 0.44  CALCIUM 8.1* 8.8*  MG  --  2.1   GFR: Estimated Creatinine Clearance: 89.4 mL/min (by C-G formula based on SCr of 0.44 mg/dL). Liver Function Tests: No results for input(s): AST, ALT, ALKPHOS, BILITOT, PROT, ALBUMIN in the last 168 hours. No results for input(s): LIPASE, AMYLASE in the last 168 hours. No results for input(s): AMMONIA in the last 168 hours. Coagulation Profile: No results for input(s): INR, PROTIME in the last 168 hours. Cardiac Enzymes:  Recent Labs Lab 02/17/16 0506  CKTOTAL 15*  BNP (last 3 results) No results for input(s): PROBNP in the last 8760 hours. HbA1C: No results for input(s): HGBA1C in the last 72 hours. CBG: No results for input(s): GLUCAP in the last 168 hours. Lipid Profile: No results for input(s): CHOL, HDL, LDLCALC, TRIG, CHOLHDL, LDLDIRECT in the last 72 hours. Thyroid Function Tests: No results for input(s): TSH, T4TOTAL, FREET4, T3FREE, THYROIDAB in the last 72 hours. Anemia Panel: No results for input(s): VITAMINB12, FOLATE, FERRITIN, TIBC, IRON, RETICCTPCT in the last 72 hours. Urine analysis:    Component Value Date/Time   COLORURINE AMBER (A) 02/15/2016 1559   APPEARANCEUR TURBID (A) 02/15/2016 1559   LABSPEC 1.019 02/15/2016 1559   PHURINE 8.5 (H) 02/15/2016 1559   GLUCOSEU NEGATIVE 02/15/2016 1559   HGBUR NEGATIVE 02/15/2016 1559   BILIRUBINUR SMALL (A) 02/15/2016 1559   KETONESUR NEGATIVE 02/15/2016 1559   PROTEINUR 100 (A) 02/15/2016 1559   UROBILINOGEN 2.0 (H) 10/29/2014 1627   NITRITE NEGATIVE 02/15/2016 1559   LEUKOCYTESUR MODERATE (A) 02/15/2016 1559   Sepsis Labs: _0 (procalcitonin:4,lacticidven:4)  ) Recent Results (from the past 240 hour(s))  Culture, blood (routine x 2)     Status: None   Collection Time: 02/15/16  3:56 PM  Result Value Ref Range Status   Specimen Description BLOOD  BLOOD RIGHT FOREARM  Final   Special Requests BOTTLES DRAWN AEROBIC AND ANAEROBIC 5 CC EA  Final   Culture   Final    NO GROWTH 5 DAYS Performed at [facility]    Report Status 02/20/2016 FINAL  Final  Urine culture     Status: Abnormal   Collection Time: 02/15/16  3:59 PM  Result Value Ref Range Status   Specimen Description URINE, RANDOM  Final   Special Requests NONE  Final   Culture MULTIPLE SPECIES PRESENT, SUGGEST RECOLLECTION (A)  Final   Report Status 02/16/2016 FINAL  Final  Culture, blood (routine x 2)     Status: None   Collection Time: 02/15/16  4:10 PM  Result Value Ref Range Status   Specimen Description BLOOD LEFT HAND  Final   Special Requests IN PEDIATRIC BOTTLE [place]  Final   Culture   Final    NO GROWTH 5 DAYS Performed at [facility]    Report Status 02/20/2016 FINAL  Final  Aerobic/Anaerobic Culture (surgical/deep wound)     Status: None (Preliminary result)   Collection Time: 02/20/16  1:46 PM  Result Value Ref Range Status   Specimen Description BONE NEEDLE CORE LEFT  Final   Special Requests NONE  Final   Gram Stain   Final    FEW WBC PRESENT,BOTH PMN AND MONONUCLEAR NO ORGANISMS SEEN    Culture   Final    NO GROWTH 2 DAYS Performed at [facility]    Report Status PENDING  Incomplete      Radiology Studies: No results found.   Scheduled Meds: . baclofen  20 mg Oral TID  . cefTRIAXone (ROCEPHIN)  IV  2 g Intravenous Q24H  . DAPTOmycin (CUBICIN)  IV  500 mg Intravenous Q24H  . diazepam  5 mg Oral 2 times per day  . diazepam  7.5 mg Oral QHS  . enoxaparin (LOVENOX) injection  40 mg Subcutaneous Q24H  . gabapentin  300 mg Oral BID WC  . gabapentin  600 mg Oral QHS  . lamoTRIgine  125 mg Oral Daily  . pantoprazole  40 mg Oral Daily  . sertraline  100 mg Oral Daily  Continuous Infusions:    LOS: 8 days    Time spent: 25 min    Seirra Kos, MD PhD [facility] Pager [phone]  If 7PM-7AM, please  contact night-coverage [email or web address] Password [facility] 02/23/2016, 7:47 AM

## 2016-02-24 LAB — CK: Total CK: 52 U/L (ref 38–234)

## 2016-02-24 MED ORDER — SODIUM CHLORIDE 0.9% FLUSH
10.0000 mL | INTRAVENOUS | Status: DC | PRN
  Administered 2016-02-25: 10 mL
  Filled 2016-02-24: qty 40

## 2016-02-24 NOTE — Progress Notes (Signed)
PROGRESS NOTE  [name]  KZS:[id number] DOB: 24-Feb-1981 DOA: 02/15/2016 PCP: Simona Huh, MD  Brief Narrative:    [name] is a 35 y.o. 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  She has a stage 4 sacral ulcer which is followed in the wound care clinic by Dr. Dellia Nims and she has a chronic wound vac.  For the last month, she has had intermittent fevers which have been temporarily suppressed by short courses of oral antibiotics.  After her last course of antibiotics, she developed fever up to 101.89F with associated nausea, chills, sweats, and her home health RN noticed increased erythema and purulence from her chronic sacral wound so she presented to the ER.  In the ER, VS notable for T = 101.30F, tachycardic to the low 100s and mildly hypotensive.  WBC 10.7.  MRI of the sacrum demonstrated osteomyelitis of the posterior left acetabulum, ischial tuberosity, and inferior pubic ramus with myositis of the the left pelvis and upper leg.  No discrete abscess.  Due to concern for sepsis, she received initial antibiotics which were stopped the next day when her blood pressure stabilized and lactate remained normal.  She remained off of antibiotics for 4 days prior to bone biopsy which was performed on 11/20 by interventional radiology.  Smear demonstrated no bacteria.  Culture is pending.  She is on empiric daptomycin pending culture data and ID is following.  She may need PICC line for long course of IV antibiotics, but Dr. Tommy Medal asked that we wait for the culture data before placing PICC line because oral fluoroquinolone may still be an option.  Plastic surgery was consulted and recommended that [name] follow up with [facility] for ongoing wound management and eventual flap once her  osteomyelitis is cured and her prealbumin is greater than 16.  She would need to lie on her belly for about a month after flap procedure.    Assessment & Plan:   Active Problems:   Paraplegia ([place])   Multiple sclerosis ([place])   History of transverse myelitis   Hypokalemia   Infected decubitus ulcer   Osteomyelitis of pelvis ([facility])   Myositis   Fecal incontinence  Osteomyelitis of the posterior left acetabulum, ischial tuberosity, and inferior pubic ramus with myositis of the the left pelvis and upper leg.  -  ESR 14, CRP 20.8 >> 2.6 -  CK wnl -  Blood culture NGTD -  Wound care /plastic surgery stated patient is not ready for flap -  Bone biopsy by IR performed on 11/20, culture NGTD -  continue Zinc, vit C, MVI General surgery consulted for sacral wound management (patient reported she was seen at the wound care center and has a wound vac on for a few weeks, then developed fever and sepsis and had to be admitted to the hospital)and to discuss diverting colostomy Family declined surgery currently -picc line placed on 11/24, per id recommendation: plan to d/c home with daptomycin and rocephin  Hypokalemia resolved with oral and IV supplementation  Nausea, vomiting, and diarrhea, likely due to underlying infection and resolved with IVF and antibiotics  Paraplegia due to history of transverse myelitis  Normocytic anemia, likely due to inflammation from osteomyelitis -  No need for blood transfusion  Sleepy/somnolent, patient asking to reduce her  medications gradually while in hospital so that she is more lucid during the day.  Feels overmedicated. -  reduced dose of lunchtime gabapentin today -  Continue valium at 7.74m qhs (reduced on 11/20)  -improving, more alert  Muscle spasms -  Continue baclofen  HIV, HCV and hepatitis B negative  Severe protein calorie malnutrition, prealbumin 6.1 -  Needs prealbumin to be at least 16 before she would be a candidate for definitive  surgery  DVT prophylaxis while in the hospital:  lovenox  Code Status: full Family Communication: patient and her mother  Disposition Plan:  home with home health and home iv abx on 11/25  Consultants:   Wound care/Plastic surgery  ID, Dr. VTommy Medal IR for bone biopsy  General surgery  Procedures:  Bone biopsy 11/20   Antimicrobials:   Vancomycin and doxycycline 11/15 - 11/16 (on hold for possible biopsy)  daptomycin from 11/20  rocephin from 11/22   Subjective:    alert today and feeling well,  S/p picc line placement No fever, mother at bedside  Objective: Vitals:   02/23/16 0610 02/23/16 1529 02/23/16 2240 02/24/16 0646  BP: 120/70 114/70 136/88 112/72  Pulse: 86 87 89 85  Resp: 18 16 16 16   Temp: 97.7 F (36.5 C) 97.8 F (36.6 C) 98.3 F (36.8 C) 98.3 F (36.8 C)  TempSrc: Oral Oral Oral Oral  SpO2: 97% 98% 100% 99%  Weight:      Height:        Intake/Output Summary (Last 24 hours) at 02/24/16 1604 Last data filed at 02/24/16 1000  Gross per 24 hour  Intake              640 ml  Output             1900 ml  Net            -1260 ml   Filed Weights   02/21/16 0200  Weight: 65.7 kg (144 lb 13.5 oz)    Examination:  General exam:  Adult female.  No acute distress.  Awake and alert today , picc line to left arm HEENT:  NCAT, MMM Respiratory system: Clear to auscultation bilaterally Cardiovascular system:  RRR.  No murmurs, rubs, gallops or clicks.  Warm extremities Gastrointestinal system: Normal active bowel sounds, soft, nondistended, nontender.  Urostomy with dark urine MSK:  Flaccid legs, foot contractures.  Trace bilateral lower extremity edema.  Clubbed digits Neuro:  Paraplegic from waist down    Data Reviewed: I have personally reviewed following labs and imaging studies  CBC:  Recent Labs Lab 02/20/16 0417 02/23/16 0527  WBC 6.2 6.4  NEUTROABS 3.9  --   HGB 10.0* 9.7*  HCT 32.6* 32.0*  MCV 84.7 85.3  PLT 274 2517  Basic  Metabolic Panel:  Recent Labs Lab 02/23/16 0527  NA 140  K 3.9  CL 106  CO2 26  GLUCOSE 96  BUN 12  CREATININE 0.44  CALCIUM 8.8*  MG 2.1   GFR: Estimated Creatinine Clearance: 89.4 mL/min (by C-G formula based on SCr of 0.44 mg/dL). Liver Function Tests: No results for input(s): AST, ALT, ALKPHOS, BILITOT, PROT, ALBUMIN in the last 168 hours. No results for input(s): LIPASE, AMYLASE in the last 168 hours. No results for input(s): AMMONIA in the last 168 hours. Coagulation Profile: No results for input(s): INR, PROTIME in the last 168 hours. Cardiac Enzymes:  Recent Labs Lab 02/24/16 0551  CKTOTAL 52   BNP (last 3  results) No results for input(s): PROBNP in the last 8760 hours. HbA1C: No results for input(s): HGBA1C in the last 72 hours. CBG: No results for input(s): GLUCAP in the last 168 hours. Lipid Profile: No results for input(s): CHOL, HDL, LDLCALC, TRIG, CHOLHDL, LDLDIRECT in the last 72 hours. Thyroid Function Tests: No results for input(s): TSH, T4TOTAL, FREET4, T3FREE, THYROIDAB in the last 72 hours. Anemia Panel: No results for input(s): VITAMINB12, FOLATE, FERRITIN, TIBC, IRON, RETICCTPCT in the last 72 hours. Urine analysis:    Component Value Date/Time   COLORURINE AMBER (A) 02/15/2016 1559   APPEARANCEUR TURBID (A) 02/15/2016 1559   LABSPEC 1.019 02/15/2016 1559   PHURINE 8.5 (H) 02/15/2016 1559   GLUCOSEU NEGATIVE 02/15/2016 1559   HGBUR NEGATIVE 02/15/2016 1559   BILIRUBINUR SMALL (A) 02/15/2016 1559   KETONESUR NEGATIVE 02/15/2016 1559   PROTEINUR 100 (A) 02/15/2016 1559   UROBILINOGEN 2.0 (H) 10/29/2014 1627   NITRITE NEGATIVE 02/15/2016 1559   LEUKOCYTESUR MODERATE (A) 02/15/2016 1559   Sepsis Labs: @LABRCNTIP (procalcitonin:4,lacticidven:4)  ) Recent Results (from the past 240 hour(s))  Culture, blood (routine x 2)     Status: None   Collection Time: 02/15/16  3:56 PM  Result Value Ref Range Status   Specimen Description BLOOD  BLOOD RIGHT FOREARM  Final   Special Requests BOTTLES DRAWN AEROBIC AND ANAEROBIC 5 CC EA  Final   Culture   Final    NO GROWTH 5 DAYS Performed at [facility]    Report Status 02/20/2016 FINAL  Final  Urine culture     Status: Abnormal   Collection Time: 02/15/16  3:59 PM  Result Value Ref Range Status   Specimen Description URINE, RANDOM  Final   Special Requests NONE  Final   Culture MULTIPLE SPECIES PRESENT, SUGGEST RECOLLECTION (A)  Final   Report Status 02/16/2016 FINAL  Final  Culture, blood (routine x 2)     Status: None   Collection Time: 02/15/16  4:10 PM  Result Value Ref Range Status   Specimen Description BLOOD LEFT HAND  Final   Special Requests IN PEDIATRIC BOTTLE [place]  Final   Culture   Final    NO GROWTH 5 DAYS Performed at [facility]    Report Status 02/20/2016 FINAL  Final  Aerobic/Anaerobic Culture (surgical/deep wound)     Status: None (Preliminary result)   Collection Time: 02/20/16  1:46 PM  Result Value Ref Range Status   Specimen Description BONE NEEDLE CORE LEFT  Final   Special Requests NONE  Final   Gram Stain   Final    FEW WBC PRESENT,BOTH PMN AND MONONUCLEAR NO ORGANISMS SEEN    Culture   Final    NO GROWTH 2 DAYS NO ANAEROBES ISOLATED; CULTURE IN PROGRESS FOR 5 DAYS Performed at [facility]    Report Status PENDING  Incomplete      Radiology Studies: No results found.   Scheduled Meds: . baclofen  20 mg Oral TID  . cefTRIAXone (ROCEPHIN)  IV  2 g Intravenous Q24H  . DAPTOmycin (CUBICIN)  IV  500 mg Intravenous Q24H  . diazepam  5 mg Oral 2 times per day  . diazepam  7.5 mg Oral QHS  . enoxaparin (LOVENOX) injection  40 mg Subcutaneous Q24H  . gabapentin  300 mg Oral BID WC  . gabapentin  600 mg Oral QHS  . lamoTRIgine  125 mg Oral Daily  . pantoprazole  40 mg Oral Daily  . sertraline  100 mg Oral Daily   Continuous Infusions:    LOS: 9 days    Time spent: 25 min    Melquiades Kovar, MD PhD [facility] Pager [phone]  If 7PM-7AM, please contact night-coverage [email or web address] Password [email or web address] 02/24/2016, 4:04 PM

## 2016-02-24 NOTE — Progress Notes (Signed)
Pharmacy Antibiotic Note  [name] is a 35 y.o. female with history of transverse myelitis and MS with paraplegia and chronic stage IV decubitus ulcers, also s/p diverting urostomy admitted on 02/15/2016 with worsening decubitus ulcer and osteomyelitis involving left acetabulum extending into inferior pubic ramus and ischium. Pt initially treated with Zosyn and doxycycline which were stopped for bone culture on 11/20.  ID following,  consulted pharmacy to start daptomycin and ceftriaxone was subsequently added.  Pt chart lists allergy to vancomycin (anaphylaxis).   Now starting D#5 daptomycin, D#3 ceftriaxone No growth to date from bone culture  Last charted weight = 65.7 kg (11/21) CrCl estimated ~90 with rounded-up SCr 0.8  Plan: Continue daptomycin 500mg  IV q24h (~7.6mg /kg)  Continue ceftriaxone 2 g IV q24h F/u CK weekly (next due 11/24) while on daptomycin F/u renal function, cultures, clinical course Unless able to target therapy based upon bone culture results, may need empiric addition of gram-negative coverage according to ID physician.  May need 4 to 8 weeks IV abx. Case management researching outpatient costs of two potential regimens:  daptomycin 500 mg IV daily + ceftriaxone 2 g IV daily  ceftaroline 600mg  IV q12h  Their note from 11/22 indicated daptomycin NOT covered by Medicaid, but ceftaroline is, and exploring options with [facility].    Temp (24hrs), Avg:98.1 F (36.7 C), Min:97.8 F (36.6 C), Max:98.3 F (36.8 C)   Recent Labs Lab 02/20/16 0417 02/23/16 0527  WBC 6.2 6.4  CREATININE  --  0.44    Estimated Creatinine Clearance: 89.4 mL/min (by C-G formula based on SCr of 0.44 mg/dL).    Allergies  Allergen Reactions  . Iodine Anaphylaxis  . Latex Anaphylaxis  . Other Anaphylaxis    ALL -MYCINS. Pt states she has had all "-mycins" in all classes and all of them cause reactions.    Marland Kitchen Shellfish-Derived Products Anaphylaxis    Reaction  unknown  . Vancomycin Anaphylaxis  . Aspirin Other (See Comments)    Reaction unknown  . Strawberry Extract Hives and Rash    Antimicrobials this admission: Zosyn 11/15 >> 11/16 Doxycycline (MD) 11/15 >> 11/16 Daptomycin 11/20 >> Ceftriaxone 11/22 >>  Dose adjustments this admission: ---  Microbiology results: 11/15 BCx: NGF 11/15 UCx: Multiple species present, lab suggests recollection 11/20 Bone Cx: ngtd  Thank you for allowing pharmacy to be a part of this patient's care.  Clayburn Pert, PharmD, BCPS Pager: [phone] 02/24/2016  9:03 AM

## 2016-02-24 NOTE — Progress Notes (Signed)
PT Note  Patient Details Name: [name] MRN: [id number] DOB: Sep 10, 1980  Reviewed pt needs for appropriate seating and specialty seating consult with Case Manager to address.  Also, made case manager aware of  pt concerns regarding air bed at home.  Pt and mother previously expressed that they were at base line level for mobility and "had been doing this for 13 years".  PT services to sign off at this time.       Verley Pariseau 02/24/2016, 5:30 PM

## 2016-02-24 NOTE — Progress Notes (Addendum)
[id number]/Raymone Pembroke,BSN,RN3,CCM:[phone]=Per [name] at [facility] Daptomycin and Rocephin are covered./Donna at advance notified of dc to home with RN for wound care/Rita Vialpando,BSN,RN3,CCM

## 2016-02-24 NOTE — Progress Notes (Signed)
Peripherally Inserted Central Catheter/Midline Placement  The IV Nurse has discussed with the patient and/or persons authorized to consent for the patient, the purpose of this procedure and the potential benefits and risks involved with this procedure.  The benefits include less needle sticks, lab draws from the catheter, and the patient may be discharged home with the catheter. Risks include, but not limited to, infection, bleeding, blood clot (thrombus formation), and puncture of an artery; nerve damage and irregular heartbeat and possibility to perform a PICC exchange if needed/ordered by physician.  Alternatives to this procedure were also discussed.  Bard Power PICC patient education guide, fact sheet on infection prevention and patient information card has been provided to patient /or left at bedside.    PICC/Midline Placement Documentation        [name] 02/24/2016, 4:46 PM

## 2016-02-25 LAB — AEROBIC/ANAEROBIC CULTURE W GRAM STAIN (SURGICAL/DEEP WOUND)

## 2016-02-25 LAB — AEROBIC/ANAEROBIC CULTURE (SURGICAL/DEEP WOUND): CULTURE: NO GROWTH

## 2016-02-25 MED ORDER — HEPARIN SOD (PORK) LOCK FLUSH 100 UNIT/ML IV SOLN
250.0000 [IU] | INTRAVENOUS | Status: AC | PRN
  Administered 2016-02-25: 250 [IU]

## 2016-02-25 MED ORDER — DEXTROSE 5 % IV SOLN: 2.0000 g | INTRAVENOUS | 0 refills | Status: AC

## 2016-02-25 MED ORDER — SODIUM CHLORIDE 0.9 % IV SOLN: 500.0000 mg | INTRAVENOUS | 0 refills | Status: AC

## 2016-02-25 NOTE — Discharge Summary (Signed)
Discharge Summary  [name]:[id number] DOB: 1980-11-01  PCP: Simona Huh, MD  Admit date: 02/15/2016 Discharge date: 02/25/2016  Time spent: >43mns  Recommendations for Outpatient Follow-up:  1. F/u with PMD within a week  for hospital discharge follow up, repeat cbc/bmp at follow up 2. F/u with infectious disease 3. F/u with wound care center 4. F/u with plastic surgery 5. F/u with urology for urostomy care  Discharge Diagnoses:  Active Hospital Problems   Diagnosis Date Noted  . Fecal incontinence 02/23/2016  . [place]omyelitis of pelvis ([place] 02/16/2016  . Myositis 02/16/2016  . Infected decubitus ulcer 02/15/2016  . Hypokalemia 02/16/2012  . Multiple sclerosis ([place] 10/16/2011  . Paraplegia ([place]   . History of transverse myelitis     Resolved Hospital Problems   Diagnosis Date Noted Date Resolved  No resolved problems to display.    Discharge Condition: stable  Diet recommendation: regular diet  Filed Weights   02/21/16 0200  Weight: 65.7 kg (144 lb 13.5 oz)    History of present illness:  Outpatient Specialists: Dr. RAsa Saunas  Patient coming from: home  Chief Complaint: fevers  HPI: [name] a 35y.o. 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  She has a stage 4 sacral ulcer which is followed by the wound care clinic and she has a chronic wound vac.  For the last month, she has had intermittent fevers which have been temporarily suppressed by antibiotics such as keflex.  She was also diagnosed with possible viral illness.  Over the last three days, her fevers returned but have been more consistent and higher, up to 101.69F with associated nausea, chills, sweats.  Her home health RN noticed increased erythema and  purulence from her chronic sacral wound which were new.  [facility] Dr. RAsa Saunaswho recommended admission to the hospital for assessment and treatment of what is likely an infected sacral decubitous ulcer.    ED Course:  In the ER, VS notable for T = 101.27F, tachycardic to the low 100s.  WBC 10.7.  Potassium 2.9.  UA demonstrated only few bacteria and 6-30 WBC.  CXR was unremarkable.    Hospital Course:  Active Problems:   Paraplegia ([place]   Multiple sclerosis ([facility]   History of transverse myelitis   Hypokalemia   Infected decubitus ulcer   Osteomyelitis of pelvis (HCC)   Myositis   Fecal incontinence  Osteomyelitis of the posterior left acetabulum, ischial tuberosity, and inferior pubic ramus with myositis of the the left pelvis and upper leg.  - ESR 14, CRP 20.8 >> 2.6, -  CK wnl, - Blood culture NGTD - Wound care /plastic surgery stated patient is not ready for flap -  Bone biopsy by IR performed on 11/20, culture NGTD -  continue Zinc, vit C, MVI -General surgery consulted for sacral wound management (patient reported she was seen at the wound care center and has a wound vac on for a few weeks, then developed fever and sepsis and had to be admitted to the hospital)and to discuss diverting colostomy Family declined surgery currently -picc line placed on 11/24, per id recommendation: plan to d/c home with daptomycin and rocephin  Discharge antibiotics:Daptomycin per pharmacy and ceftriaxone 2 g daily Per pharmacy protocol daptomycin  Duration: 6  weeks End Date:  January 1st, 2018  [facility] Care Per Protocol:  Labs weekly while on IV antibiotics: _x_ CBC with differential x__ BMP _X_ CPK _X_ ESR   __ Please pull PIC at completion of IV antibiotics X__ Please leave PIC in place until doctor has seen patient or been notified  Fax weekly labs to [phone]  Clinic Follow Up Appt:  Next 3 weeks   Hypokalemia resolved with oral and IV  supplementation  Nausea, vomiting, and diarrhea, likely due to underlying infection and resolved with IVF and antibiotics   Sleepy/somnolent, patient asking to reduce her medications gradually while in hospital so that she is more lucid during the day.  Feels overmedicated. 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 VTommy Medal IR for bone biopsy  General surgery  Procedures:  Bone biopsy 11/20   Antimicrobials:   Vancomycin and doxycycline 11/15 - 11/16 (on hold for possible biopsy)  daptomycin from 11/20  rocephin from 11/22   Discharge Exam: BP 96/63 (BP Location: Right Arm)   Pulse 83   Temp 97.7 F (36.5 C) (Oral)   Resp 17   Ht _0  (1.6 m)   Wt 65.7 kg (144 lb 13.5 oz)   LMP 02/19/2016   SpO2 100%   BMI 25.66 kg/m   General exam:  Adult female.  No acute distress.  Awake and alert today , picc line to left arm HEENT:  NCAT, MMM Respiratory system: Clear to auscultation bilaterally Cardiovascular system:  RRR.  No murmurs, rubs, gallops or clicks.  Warm extremities Gastrointestinal system: Normal active bowel sounds, soft, nondistended, nontender.  Urostomy with dark urine MSK:  Flaccid legs, foot contractures.  Trace bilateral lower extremity edema.  Clubbed digits, sacral  wound Neuro:  Paraplegic from waist down   Discharge Instructions You were cared for by a hospitalist during your hospital stay. If you have any questions about your discharge medications or the care you received while you were in the hospital after you are discharged, you can call the unit and asked to speak with the hospitalist on call if the hospitalist that took care of you is not available. Once you are discharged, your primary care physician will handle any further medical issues. Please note that NO REFILLS for any discharge medications will be authorized once you are discharged, as it is imperative that you return to your primary care physician (or establish a relationship with a primary care physician if you do not have one) for your aftercare needs so that they can reassess your need for medications and monitor your lab values.  Discharge Instructions    DME Other see comment    Complete by:  As directed    Roho cushion x1   Diet general    Complete by:  As directed    Face-to-face encounter (required for  Medicare/Medicaid patients)    Complete by:  As directed    I Nakia Koble certify that this patient is under my care and that I, or a nurse practitioner or physician's assistant working with me, had a face-to-face encounter that meets the physician face-to-face encounter requirements with this patient on 02/24/2016. The encounter with the patient was in whole, or in part for the following medical condition(s) which is the primary reason for home health care (List medical condition): FTT RN for wound care and iv abx infusion   The encounter with the patient was in whole, or in part, for the following medical condition, which is the primary reason for home health care:  FTT   I certify that, based on my findings, the following services are medically necessary home health services:  Nursing   Reason for Medically Necessary Home Health Services:  Skilled Nursing- Change/Decline in Patient  Status   My clinical findings support the need for the above services:  Bedbound   Further, I certify that my clinical findings support that this patient is homebound due to:  Immunocompromised   Home Health    Complete by:  As directed    To provide the following care/treatments:  RN   Discharge antibiotics:Daptomycin per pharmacy and ceftriaxone 2 g daily Per pharmacy protocol daptomycin  Duration: 6 weeks End Date:  January 1st, 2018  [facility] Care Per Protocol:  Labs weekly while on IV antibiotics: _x_ CBC with differential x__ BMP _X_ CPK _X_ ESR   __ Please pull PIC at completion of IV antibiotics X__ Please leave PIC in place until doctor has seen patient or been notified  Fax weekly labs to [phone]  Clinic Follow Up Appt:  Next 3 weeks  Home health RN for wound care as well.   Increase activity slowly    Complete by:  As directed        Medication List    STOP taking these medications   naproxen sodium 220 MG tablet Commonly known as:  ANAPROX     TAKE these medications   acetaminophen 325 MG tablet Commonly known as:  TYLENOL Take 650 mg by mouth every 6 (six) hours as needed for fever.   baclofen 20 MG tablet Commonly known as:  LIORESAL Take 20 mg by mouth 3 (three) times daily.   bisacodyl 10 MG suppository Commonly known as:  DULCOLAX Place 1 suppository (10 mg total) rectally daily as needed for moderate constipation.   cefTRIAXone 2 g in dextrose 5 % 50 mL Inject 2 g into the vein daily. End date 04/02/2016   DAPTOmycin 500 mg in sodium chloride 0.9 % 100 mL Inject 500 mg into the vein daily. End date 04/02/2016  Labs weekly while on IV antibiotics: _x_ CBC with differential x__ BMP _X_ CPK _X_ ESR  Fax weekly labs to [phone]   diazepam 5 MG tablet Commonly known as:  VALIUM Take 1-2 tablets (5-10 mg total) by mouth every 8 (eight) hours. Take 1 tablet in AM, 1 tablet in PM, and 2 tablets at bedtime What  changed:  when to take this  additional instructions   fluticasone 50 MCG/ACT nasal spray Commonly known as:  FLONASE Place 2 sprays into the nose daily as needed for allergies.   gabapentin 300 MG capsule Commonly known as:  NEURONTIN Take 2 capsules (600 mg total) by mouth 3 (three) times daily.   ibuprofen 200 MG tablet Commonly known as:  ADVIL,MOTRIN Take 400 mg by mouth  every 6 (six) hours as needed for headache, mild pain or moderate pain.   lamoTRIgine 25 MG tablet Commonly known as:  LAMICTAL Take 125 mg by mouth daily.   loratadine 5 MG/5ML syrup Commonly known as:  CLARITIN Take 5 mg by mouth daily.   lubiprostone 24 MCG capsule Commonly known as:  AMITIZA Take 24 mcg by mouth 2 (two) times daily with a meal.   omeprazole 40 MG capsule Commonly known as:  PRILOSEC Take 40 mg by mouth daily.   ondansetron 4 MG disintegrating tablet Commonly known as:  ZOFRAN ODT '4mg'$  ODT q4 hours prn nausea/vomit   senna-docusate 8.6-50 MG tablet Commonly known as:  Senokot-S Take 1 tablet by mouth 2 (two) times daily. What changed:  when to take this  reasons to take this   sertraline 100 MG tablet Commonly known as:  ZOLOFT Take 100 mg by mouth daily.            Durable Medical Equipment        Start     Ordered   02/22/16 0000  DME Other see comment    Comments:  Roho cushion x1   02/22/16 1526     Allergies  Allergen Reactions  . Iodine Anaphylaxis  . Latex Anaphylaxis  . Other Anaphylaxis    ALL -MYCINS. Pt states she has had all "-mycins" in all classes and all of them cause reactions.    Marland Kitchen Shellfish-Derived Products Anaphylaxis    Reaction unknown  . Vancomycin Anaphylaxis  . Aspirin Other (See Comments)    Reaction unknown  . Strawberry Extract Hives and Rash   Follow-up Information    Cyndee Brightly, MD Follow up in 1 week(s).   Specialty:  Internal Medicine Why:  hospital discharge follow up for sacral wound Contact  information: [address] [place] [address] [phone]        [facility]              Follow up in 3 week(s).   Contact information: [address] [place] [address]       Simona Huh, MD Follow up in 1 week(s).   Specialty:  Family Medicine Why:  hospital discharge follow up Contact information: [address] [place] [address] [phone]        [facility] Follow up.   Why:  Home Health RN and IV antibiotics Contact information: [place] [address] [place], [place] Follow up.   Specialty:  Plastic Surgery Contact information: [place] [address] [phone]            The results of significant diagnostics from this hospitalization (including imaging, microbiology, ancillary and laboratory) are listed below for reference.    Significant Diagnostic Studies: Dg Chest 2 View  Result Date: 01/29/2016 CLINICAL DATA:  Fever, vomiting. EXAM: CHEST  2 VIEW COMPARISON:  Radiographs of January 07, 2016. FINDINGS: The heart size and mediastinal contours are within normal limits. Both lungs are clear. No pneumothorax or pleural effusion is noted. The visualized skeletal structures are unremarkable. IMPRESSION: No active cardiopulmonary disease. Electronically Signed   By: Marijo Conception, M.D.   On: 01/29/2016 14:21   Mr Pelvis W Wo Contrast  Result Date: 02/16/2016 CLINICAL DATA:  Paraplegic patient with a large sacral decubitus ulcer intermittent fevers for  the past month. EXAM: MRI PELVIS WITHOUT AND WITH CONTRAST TECHNIQUE: Multiplanar multisequence MR imaging of the pelvis was performed both before and after administration of intravenous contrast. CONTRAST:  13 ml MULTIHANCE GADOBENATE DIMEGLUMINE 529 MG/ML IV SOLN<Contrast>22m MULTIHANCE GADOBENATE DIMEGLUMINE 529 MG/ML  IV SOLN COMPARISON:  CT abdomen and pelvis 09/27/2014. FINDINGS: A large decubitus ulcer is seen along the inferior aspect of the left buttock and left upper thigh. Intense marrow edema and enhancement in the posterior left acetabulum extending into the inferior pubic ramus and ischium is consistent with osteomyelitis. Intense edema and enhancement are seen in the left gluteal, obturator internus, obturator externus,, adductor magnus and brevis, pectineus, hamstring and vastus intermedius and lateralis muscles consistent with myositis. No abscess is identified. There is no hip joint effusion. No muscle tear is identified. All imaged musculature is atrophic. IMPRESSION: Findings consistent with osteomyelitis in the posterior left acetabulum, ischial tuberosity and inferior pubic ramus. Myositis of extensive musculature of the left pelvis and upper leg. No abscess is identified. Negative for septic joint. Electronically Signed   By: TInge RiseM.D.   On: 02/16/2016 09:38   Ct Aspiration  Result Date: 02/20/2016 CLINICAL DATA:  Decubitus ulcer with suggestion of osteomyelitis involving the the left ischium on MRI. EXAM: CT GUIDED CORE BIOPSY OF LEFT ISCHIUM ANESTHESIA/SEDATION: Intravenous Fentanyl and Versed were administered as conscious sedation during continuous monitoring of the patient's level of consciousness and physiological / cardiorespiratory status by the radiology RN, with a total moderate sedation time of 21 minutes. PROCEDURE: The procedure risks, benefits, and alternatives were explained to the patient. Questions regarding the procedure were encouraged and answered. The patient understands and consents to the procedure. Patient placed prone. Select axial scans through the pelvis obtained the, an appropriate skin entry site was identified and marked. The operative field was prepped with chlorhexidinein a sterile fashion, and a sterile drape was applied covering the operative field. A sterile  gown and sterile gloves were used for the procedure. Local anesthesia was provided with 1% Lidocaine. Under CT fluoroscopic guidance, a 11 gauge Cook bone trocar needle was advanced to the margin of the lesion. Once needle tip position was confirmed, core biopsy samples were obtained, submitted in nonbacteriostatic saline to microbiology. The guide needle was removed. Postprocedure scans show no hemorrhage or other apparent complication. The patient tolerated the procedure well. COMPLICATIONS: None immediate FINDINGS: Sclerosis in the left ischium and ischial tuberosity was identified. Representative core biopsy samples were obtained under CT fluoroscopic guidance. IMPRESSION: 1. Technically successful core biopsy of left ischium, sent for microbiology analysis. Electronically Signed   By: DLucrezia EuropeM.D.   On: 02/20/2016 16:40   Dg Chest Port 1 View  Result Date: 02/15/2016 CLINICAL DATA:  Intermittent fever and nausea. EXAM: PORTABLE CHEST 1 VIEW COMPARISON:  01/29/2016 FINDINGS: Both lungs are clear. Heart and mediastinum are within normal limits. The trachea is midline. Negative for a pneumothorax. No acute bone abnormality. IMPRESSION: No active disease. Electronically Signed   By: AMarkus DaftM.D.   On: 02/15/2016 16:34    Microbiology: Recent Results (from the past 240 hour(s))  Culture, blood (routine x 2)     Status: None   Collection Time: 02/15/16  3:56 PM  Result Value Ref Range Status   Specimen Description BLOOD BLOOD RIGHT FOREARM  Final   Special Requests BOTTLES DRAWN AEROBIC AND ANAEROBIC 5 CC EA  Final   Culture   Final    NO GROWTH 5 DAYS Performed at  [facility]    Report Status 02/20/2016 FINAL  Final  Urine culture     Status: Abnormal   Collection Time: 02/15/16  3:59 PM  Result Value Ref Range Status   Specimen Description URINE, RANDOM  Final   Special Requests NONE  Final   Culture MULTIPLE SPECIES PRESENT, SUGGEST RECOLLECTION (A)  Final   Report Status  02/16/2016 FINAL  Final  Culture, blood (routine x 2)     Status: None   Collection Time: 02/15/16  4:10 PM  Result Value Ref Range Status   Specimen Description BLOOD LEFT HAND  Final   Special Requests IN PEDIATRIC BOTTLE 2CC  Final   Culture   Final    NO GROWTH 5 DAYS Performed at [facility]    Report Status 02/20/2016 FINAL  Final  Aerobic/Anaerobic Culture (surgical/deep wound)     Status: None (Preliminary result)   Collection Time: 02/20/16  1:46 PM  Result Value Ref Range Status   Specimen Description BONE NEEDLE CORE LEFT  Final   Special Requests NONE  Final   Gram Stain   Final    FEW WBC PRESENT,BOTH PMN AND MONONUCLEAR NO ORGANISMS SEEN    Culture   Final    NO GROWTH 2 DAYS NO ANAEROBES ISOLATED; CULTURE IN PROGRESS FOR 5 DAYS Performed at [facility]    Report Status PENDING  Incomplete     Labs: Basic Metabolic Panel:  Recent Labs Lab 02/23/16 0527  NA 140  K 3.9  CL 106  CO2 26  GLUCOSE 96  BUN 12  CREATININE 0.44  CALCIUM 8.8*  MG 2.1   Liver Function Tests: No results for input(s): AST, ALT, ALKPHOS, BILITOT, PROT, ALBUMIN in the last 168 hours. No results for input(s): LIPASE, AMYLASE in the last 168 hours. No results for input(s): AMMONIA in the last 168 hours. CBC:  Recent Labs Lab 02/20/16 0417 02/23/16 0527  WBC 6.2 6.4  NEUTROABS 3.9  --   HGB 10.0* 9.7*  HCT 32.6* 32.0*  MCV 84.7 85.3  PLT 274 294   Cardiac Enzymes:  Recent Labs Lab 02/24/16 0551  CKTOTAL 52   BNP: BNP (last 3 results) No results for input(s): BNP in the last 8760 hours.  ProBNP (last 3 results) No results for input(s): PROBNP in the last 8760 hours.  CBG: No results for input(s): GLUCAP in the last 168 hours.     SignedFlorencia Reasons MD, PhD  [facility] 02/25/2016, 10:39 AM

## 2016-02-29 ENCOUNTER — Telehealth: Payer: Self-pay | Admitting: Neurology

## 2016-02-29 NOTE — Telephone Encounter (Signed)
Message relayed to patient. Verbalized understanding and denied questions.   

## 2016-02-29 NOTE — Telephone Encounter (Signed)
Discharge summary instructions:  1. F/u with PMD within a week  for hospital discharge follow up, repeat cbc/bmp at follow up 2. F/u with infectious disease 3. F/u with wound care center 4. F/u with plastic surgery 5. F/u with urology for urostomy care   NO need for Neuro f/u.

## 2016-02-29 NOTE — Telephone Encounter (Signed)
PT called and said she just got out of the hospital and needed to schedule a follow up with all her doctors but she was not sure if that would pertain to Dr Sophronia Simas CB# [phone]

## 2016-03-05 ENCOUNTER — Encounter (HOSPITAL_BASED_OUTPATIENT_CLINIC_OR_DEPARTMENT_OTHER): Payer: Medicaid Other | Attending: Internal Medicine

## 2016-03-05 ENCOUNTER — Encounter: Payer: Self-pay | Admitting: Internal Medicine

## 2016-03-05 DIAGNOSIS — G35 Multiple sclerosis: Secondary | ICD-10-CM | POA: Insufficient documentation

## 2016-03-05 DIAGNOSIS — G8220 Paraplegia, unspecified: Secondary | ICD-10-CM | POA: Insufficient documentation

## 2016-03-05 DIAGNOSIS — L89324 Pressure ulcer of left buttock, stage 4: Secondary | ICD-10-CM | POA: Insufficient documentation

## 2016-03-12 ENCOUNTER — Encounter: Payer: Self-pay | Admitting: Internal Medicine

## 2016-03-22 ENCOUNTER — Ambulatory Visit (INDEPENDENT_AMBULATORY_CARE_PROVIDER_SITE_OTHER): Payer: Medicaid Other | Admitting: Internal Medicine

## 2016-03-22 ENCOUNTER — Encounter: Payer: Self-pay | Admitting: Internal Medicine

## 2016-03-22 DIAGNOSIS — M869 Osteomyelitis, unspecified: Secondary | ICD-10-CM

## 2016-03-22 LAB — CBC
HCT: 33.8 % — ABNORMAL LOW (ref 35.0–45.0)
Hemoglobin: 10.3 g/dL — ABNORMAL LOW (ref 11.7–15.5)
MCH: 23.8 pg — ABNORMAL LOW (ref 27.0–33.0)
MCHC: 30.5 g/dL — ABNORMAL LOW (ref 32.0–36.0)
MCV: 78.2 fL — ABNORMAL LOW (ref 80.0–100.0)
MPV: 8.6 fL (ref 7.5–12.5)
PLATELETS: 304 10*3/uL (ref 140–400)
RBC: 4.32 MIL/uL (ref 3.80–5.10)
RDW: 16.5 % — AB (ref 11.0–15.0)
WBC: 5 10*3/uL (ref 3.8–10.8)

## 2016-03-22 NOTE — Progress Notes (Signed)
[place] for Infectious Disease  Patient Active Problem List   Diagnosis Date Noted  . 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 Infected decubitus ulcer 02/15/2016    Priority: High  . Fecal incontinence 02/23/2016  . Myositis 02/16/2016  . Pressure injury of skin 01/08/2016  . Nausea & vomiting 01/07/2016  . Ileus ([place]) 10/29/2014  . Constipation 10/29/2014  . Obstipation 09/27/2014  . UTI (urinary tract infection) 03/31/2013  . Influenza 03/30/2013  . Nausea and vomiting 03/30/2013  . Hypokalemia 02/16/2012  . Multiple sclerosis ([place]) 10/16/2011  . Paraplegia ([place])   . History of transverse myelitis     Patient's Medications  New Prescriptions   No medications on file  Previous Medications   ACETAMINOPHEN (TYLENOL) 325 MG TABLET    Take 650 mg by mouth every 6 (six) hours as needed for fever.   BACLOFEN (LIORESAL) 20 MG TABLET    Take 20 mg by mouth 3 (three) times daily.   BISACODYL (DULCOLAX) 10 MG SUPPOSITORY    Place 1 suppository (10 mg total) rectally daily as needed for moderate constipation.   CEFTRIAXONE 2 G IN DEXTROSE 5 % 50 ML    Inject 2 g into the vein daily. End date 04/02/2016   DAPTOMYCIN 500 MG IN SODIUM CHLORIDE 0.9 % 100 ML    Inject 500 mg into the vein daily. End date 04/02/2016  Labs weekly while on IV antibiotics: _x_ CBC with differential x__ BMP _X_ CPK _X_ ESR  Fax weekly labs to [phone]   DIAZEPAM (VALIUM) 5 MG TABLET    Take 1-2 tablets (5-10 mg total) by mouth every 8 (eight) hours. Take 1 tablet in AM, 1 tablet in PM, and 2 tablets at bedtime   FLUTICASONE (FLONASE) 50 MCG/ACT NASAL SPRAY    Place 2 sprays into the nose daily as needed for allergies.    GABAPENTIN (NEURONTIN) 300 MG CAPSULE    Take 2 capsules (600 mg total) by mouth 3 (three) times daily.   IBUPROFEN (ADVIL,MOTRIN) 200 MG TABLET    Take 400 mg by mouth every 6 (six) hours as needed for headache, mild pain or moderate pain.    LAMOTRIGINE (LAMICTAL) 25 MG TABLET    Take 125 mg by mouth daily.   LORATADINE (CLARITIN) 5 MG/5ML SYRUP    Take 5 mg by mouth daily.   LUBIPROSTONE (AMITIZA) 24 MCG CAPSULE    Take 24 mcg by mouth 2 (two) times daily with a meal.   OMEPRAZOLE (PRILOSEC) 40 MG CAPSULE    Take 40 mg by mouth daily.   ONDANSETRON (ZOFRAN ODT) 4 MG DISINTEGRATING TABLET    14m ODT q4 hours prn nausea/vomit   SENNA-DOCUSATE (SENOKOT-S) 8.6-50 MG PER TABLET    Take 1 tablet by mouth 2 (two) times daily.   SERTRALINE (ZOLOFT) 100 MG TABLET    Take 100 mg by mouth daily.  Modified Medications   No medications on file  Discontinued Medications   No medications on file    Subjective: Ms. MBlakeleyis in with her mother for her hospital follow-up visit. She is paraplegic. She recently was required to change from her old air mattress to a regular mattress and she was spending more time in her wheelchair which has its original, now hard, see cushion. She developed a blister on her left buttocks that turned into an ulcer and became infected. 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 No bone cultures were done. She was seen in the hospital by my partner, Dr. Tommy Medal, and discharged on IV daptomycin and ceftriaxone. She is now completed 29 days of antibiotic therapy. She has had no problems tolerating her PICC but her nurse was unable to draw blood from the PICC yesterday. I did instill thrombolytic agents but still could not draw blood. They also could not get blood from a venous stick. She is having no problems with infusing her antibiotics and she is tolerating the antibiotics well. Her fevers have resolved and she is feeling much better. Her mother is changing the dressing daily and notes that the wound is much improved. It is now about half the size that it was when she went in the hospital. Her nurse change the dressing once a week. Her nurse saw her last on 03/19/2016 at that time the wound measured 3.5 x 2.5 cm  in diameter and 4.6 cm deep. There was a moderate amount of wound drainage that was pink and serosanguineous with no odor.  Review of Systems: Review of Systems  Constitutional: Negative for chills, diaphoresis, fever and weight loss.  Gastrointestinal: Positive for nausea. Negative for abdominal pain, diarrhea and vomiting.       She is having some mild nausea without vomiting now which is normal when she has her menses.  Skin: Negative for rash.    Past Medical History:  Diagnosis Date  . Bladder stones    Hx of  . Depression   . GERD (gastroesophageal reflux disease)   . Multiple sclerosis ([place])   . Paraplegia ([place])   . PTSD (post-traumatic stress disorder)   . Transverse myelitis ([place])     Social History  Substance Use Topics  . Smoking status: Never Smoker  . Smokeless tobacco: Never Used  . Alcohol use No    Family History  Problem Relation Age of Onset  . Diabetes type II Father   . CAD Father   . Dementia Father   . Stroke Father   . Seizures Father   . Diabetes type II Mother   . Hypertension Mother   . Breast cancer Other   . Diabetes type II Other     Allergies  Allergen Reactions  . Iodine Anaphylaxis  . Latex Anaphylaxis  . Other Anaphylaxis    ALL -MYCINS. Pt states she has had all "-mycins" in all classes and all of them cause reactions.    Marland Kitchen Shellfish-Derived Products Anaphylaxis    Reaction unknown  . Vancomycin Anaphylaxis  . Aspirin Other (See Comments)    Reaction unknown  . Strawberry Extract Hives and Rash    Objective: Vitals:   03/22/16 1014  BP: 104/69  Pulse: 87  Temp: 97.6 F (36.4 C)  TempSrc: Oral   There is no height or weight on file to calculate BMI.  Physical Exam  Constitutional: She is oriented to person, place, and time.  She is in good spirits. She is seated in her wheelchair.  Cardiovascular: Normal rate and regular rhythm.   No murmur heard. Pulmonary/Chest: Effort normal and breath sounds normal. She has  no wheezes. She has no rales.  Musculoskeletal:  Her wound was not examined.  Neurological: She is alert and oriented to person, place, and time.  Skin: No rash noted.  Psychiatric: Mood and affect normal.    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 I will see if we can draw blood here today. If that is successful then I will not have her PICC changed but will simply have her complete therapy through 04/02/2016. She will follow-up in 4 weeks.      Relevant Orders   CBC   Basic metabolic panel   C-reactive protein   Sedimentation rate   CK (Creatine Kinase)       Michel Bickers, MD [facility] for Infectious [place] [phone] pager   [phone] cell 03/22/2016, 11:14 AM

## 2016-03-22 NOTE — Progress Notes (Signed)
Per verbal order from Dr. Michel Bickers last day of IV antibiotics is 04/02/16 and the PICC should be removed after the last dose of IV antibiotics.  Debbie, RN, [place], verbalized back these orders.

## 2016-03-22 NOTE — Assessment & Plan Note (Signed)
She is improving on broad empiric antibiotic therapy for sacral decubitus wound infection and osteomyelitis. I will see if we can draw blood here today. If that is successful then I will not have her PICC changed but will simply have her complete therapy through 04/02/2016. She will follow-up in 4 weeks.

## 2016-03-23 LAB — BASIC METABOLIC PANEL
BUN: 10 mg/dL (ref 7–25)
CALCIUM: 9 mg/dL (ref 8.6–10.2)
CHLORIDE: 105 mmol/L (ref 98–110)
CO2: 22 mmol/L (ref 20–31)
CREATININE: 0.41 mg/dL — AB (ref 0.50–1.10)
Glucose, Bld: 108 mg/dL — ABNORMAL HIGH (ref 65–99)
Potassium: 4.1 mmol/L (ref 3.5–5.3)
SODIUM: 142 mmol/L (ref 135–146)

## 2016-03-23 LAB — C-REACTIVE PROTEIN: CRP: 12.4 mg/L — ABNORMAL HIGH (ref <8.0)

## 2016-03-23 LAB — SEDIMENTATION RATE: SED RATE: 11 mm/h (ref 0–20)

## 2016-03-23 LAB — CK: CK TOTAL: 18 U/L (ref 7–177)

## 2016-03-30 DIAGNOSIS — G35 Multiple sclerosis: Secondary | ICD-10-CM | POA: Diagnosis not present

## 2016-03-30 DIAGNOSIS — L89324 Pressure ulcer of left buttock, stage 4: Secondary | ICD-10-CM | POA: Diagnosis not present

## 2016-03-30 DIAGNOSIS — G8220 Paraplegia, unspecified: Secondary | ICD-10-CM | POA: Diagnosis not present

## 2016-04-03 ENCOUNTER — Telehealth: Payer: Self-pay | Admitting: *Deleted

## 2016-04-03 NOTE — Telephone Encounter (Signed)
Amy with [facility] called to get the [facility] to D/C the patient PICC. Per the doctor last note advised was to end IV antibiotic 04/02/16 and was to D/C PICC at that time as well. She advised she will go out and pull picc tomorrow as the patient still had 1 dose for today.

## 2016-04-19 ENCOUNTER — Ambulatory Visit: Payer: Medicaid Other | Admitting: Internal Medicine

## 2016-04-25 ENCOUNTER — Encounter (HOSPITAL_BASED_OUTPATIENT_CLINIC_OR_DEPARTMENT_OTHER): Payer: Medicaid Other

## 2016-04-26 ENCOUNTER — Encounter (HOSPITAL_BASED_OUTPATIENT_CLINIC_OR_DEPARTMENT_OTHER): Payer: Medicaid Other | Attending: Internal Medicine

## 2016-05-02 ENCOUNTER — Encounter: Payer: Self-pay | Admitting: Internal Medicine

## 2016-05-02 ENCOUNTER — Ambulatory Visit (INDEPENDENT_AMBULATORY_CARE_PROVIDER_SITE_OTHER): Payer: Medicaid Other | Admitting: Internal Medicine

## 2016-05-02 DIAGNOSIS — M869 Osteomyelitis, unspecified: Secondary | ICD-10-CM

## 2016-05-02 NOTE — Assessment & Plan Note (Signed)
I am hopeful that her osteomyelitis has been cured. She still has problems offloading pressure and her nutritional status is not ideal. I spoke to her home wound care nurse, Amy Angrave. She said that there is occasionally a tinge of green drainage but no other signs of active infection. The wound has gotten better overall but has recently stalled. She is scheduled to follow-up with Dr. Dellia Nims in 2 days. I will have her follow-up here as needed.

## 2016-05-02 NOTE — Progress Notes (Signed)
[place] for Infectious Disease  Patient Active Problem List   Diagnosis Date Noted  . 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 Infected decubitus ulcer 02/15/2016    Priority: High  . Fecal incontinence 02/23/2016  . Myositis 02/16/2016  . Pressure injury of skin 01/08/2016  . Nausea & vomiting 01/07/2016  . Ileus ([place]) 10/29/2014  . Constipation 10/29/2014  . Obstipation 09/27/2014  . UTI (urinary tract infection) 03/31/2013  . Influenza 03/30/2013  . Nausea and vomiting 03/30/2013  . Hypokalemia 02/16/2012  . Multiple sclerosis ([place]) 10/16/2011  . Paraplegia ([place])   . History of transverse myelitis     Patient's Medications  New Prescriptions   No medications on file  Previous Medications   ACETAMINOPHEN (TYLENOL) 325 MG TABLET    Take 650 mg by mouth every 6 (six) hours as needed for fever.   BACLOFEN (LIORESAL) 20 MG TABLET    Take 20 mg by mouth 3 (three) times daily.   BISACODYL (DULCOLAX) 10 MG SUPPOSITORY    Place 1 suppository (10 mg total) rectally daily as needed for moderate constipation.   DIAZEPAM (VALIUM) 5 MG TABLET    Take 1-2 tablets (5-10 mg total) by mouth every 8 (eight) hours. Take 1 tablet in AM, 1 tablet in PM, and 2 tablets at bedtime   FLUTICASONE (FLONASE) 50 MCG/ACT NASAL SPRAY    Place 2 sprays into the nose daily as needed for allergies.    GABAPENTIN (NEURONTIN) 300 MG CAPSULE    Take 2 capsules (600 mg total) by mouth 3 (three) times daily.   IBUPROFEN (ADVIL,MOTRIN) 200 MG TABLET    Take 400 mg by mouth every 6 (six) hours as needed for headache, mild pain or moderate pain.    LAMOTRIGINE (LAMICTAL) 25 MG TABLET    Take 125 mg by mouth daily.   LORATADINE (CLARITIN) 5 MG/5ML SYRUP    Take 5 mg by mouth daily.   LUBIPROSTONE (AMITIZA) 24 MCG CAPSULE    Take 24 mcg by mouth 2 (two) times daily with a meal.   OMEPRAZOLE (PRILOSEC) 40 MG CAPSULE    Take 40 mg by mouth daily.   ONDANSETRON (ZOFRAN ODT) 4 MG  DISINTEGRATING TABLET    4mg  ODT q4 hours prn nausea/vomit   SENNA-DOCUSATE (SENOKOT-S) 8.6-50 MG PER TABLET    Take 1 tablet by mouth 2 (two) times daily.   SERTRALINE (ZOLOFT) 100 MG TABLET    Take 100 mg by mouth daily.  Modified Medications   No medications on file  Discontinued Medications   No medications on file    Subjective: [name] Is in for her routine follow-up visit. She completed 6 weeks of IV daptomycin and ceftriaxone on 04/02/2016 for her sacral decubitus and pelvic osteomyelitis. She had no problems tolerating her PICC or antibiotics. She admits that she has had ongoing problems offloading pressure from her left hip. She has not been able to get a new cushion for her electric wheelchair from [facility] yet. She is not able to put a pillow under her left hip when she is in her portable wheelchair as Dr. Dellia Nims has recommended. She states that when the weather is dark and damp she is more depressed and sleeps more. The diazepam that she takes for her muscle spasms also makes her sleepy and she will frequently sleeps through meals. She tries to get 1-2 protein shakes daily.  Review of Systems: Review of Systems  Constitutional: Negative for chills, diaphoresis and fever.  Gastrointestinal: Negative for abdominal pain, diarrhea, nausea and vomiting.    Past Medical History:  Diagnosis Date  . Bladder stones    Hx of  . Depression   . GERD (gastroesophageal reflux disease)   . Multiple sclerosis ([place])   . Paraplegia ([place])   . PTSD (post-traumatic stress disorder)   . Transverse myelitis ([place])     Social History  Substance Use Topics  . Smoking status: Never Smoker  . Smokeless tobacco: Never Used  . Alcohol use No    Family History  Problem Relation Age of Onset  . Diabetes type II Father   . CAD Father   . Dementia Father   . Stroke Father   . Seizures Father   . Diabetes type II Mother   . Hypertension Mother   . Breast cancer Other   . Diabetes type II  Other     Allergies  Allergen Reactions  . Iodine Anaphylaxis  . Latex Anaphylaxis  . Other Anaphylaxis    ALL -MYCINS. Pt states she has had all "-mycins" in all classes and all of them cause reactions.    Marland Kitchen Shellfish-Derived Products Anaphylaxis    Reaction unknown  . Vancomycin Anaphylaxis  . Aspirin Other (See Comments)    Reaction unknown  . Strawberry Extract Hives and Rash    Objective: Vitals:   There is no height or weight on file to calculate BMI.  Physical Exam  Constitutional:  She is seated in her portable wheelchair. She has a little drowsy. 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 She still has problems offloading pressure and her nutritional status is not ideal. I spoke to her home wound care nurse, Amy Angrave. She said that there is occasionally a tinge of green drainage but no other signs of active infection. The wound has gotten better overall but has recently stalled. She is scheduled to follow-up with Dr. Dellia Nims in 2 days. I will have her follow-up here as needed.          Michel Bickers, MD [facility] for Infectious [place] [facility] [phone] pager   [phone] cell 05/02/2016, 3:50 PM

## 2016-05-03 ENCOUNTER — Encounter (HOSPITAL_BASED_OUTPATIENT_CLINIC_OR_DEPARTMENT_OTHER): Payer: Medicaid Other | Attending: Internal Medicine

## 2016-05-03 DIAGNOSIS — G629 Polyneuropathy, unspecified: Secondary | ICD-10-CM | POA: Insufficient documentation

## 2016-05-03 DIAGNOSIS — G35 Multiple sclerosis: Secondary | ICD-10-CM | POA: Diagnosis not present

## 2016-05-03 DIAGNOSIS — L89324 Pressure ulcer of left buttock, stage 4: Secondary | ICD-10-CM | POA: Diagnosis not present

## 2016-05-31 ENCOUNTER — Encounter (HOSPITAL_BASED_OUTPATIENT_CLINIC_OR_DEPARTMENT_OTHER): Payer: Medicaid Other | Attending: Internal Medicine

## 2016-07-05 ENCOUNTER — Encounter (HOSPITAL_BASED_OUTPATIENT_CLINIC_OR_DEPARTMENT_OTHER): Payer: Medicaid Other | Attending: Internal Medicine

## 2016-07-05 DIAGNOSIS — L89324 Pressure ulcer of left buttock, stage 4: Secondary | ICD-10-CM | POA: Insufficient documentation

## 2016-07-05 DIAGNOSIS — G35 Multiple sclerosis: Secondary | ICD-10-CM | POA: Insufficient documentation

## 2016-07-09 DIAGNOSIS — G35 Multiple sclerosis: Secondary | ICD-10-CM | POA: Diagnosis not present

## 2016-07-09 DIAGNOSIS — L89324 Pressure ulcer of left buttock, stage 4: Secondary | ICD-10-CM | POA: Diagnosis not present

## 2016-07-17 ENCOUNTER — Emergency Department (HOSPITAL_COMMUNITY): Payer: Medicaid Other

## 2016-07-17 ENCOUNTER — Encounter (HOSPITAL_COMMUNITY): Payer: Self-pay | Admitting: *Deleted

## 2016-07-17 ENCOUNTER — Inpatient Hospital Stay (HOSPITAL_COMMUNITY)
Admission: EM | Admit: 2016-07-17 | Discharge: 2016-07-19 | DRG: 391 | Disposition: A | Discharge status: Home / Self Care | Payer: Medicaid Other | Attending: Internal Medicine | Admitting: Internal Medicine

## 2016-07-17 DIAGNOSIS — R112 Nausea with vomiting, unspecified: Secondary | ICD-10-CM | POA: Diagnosis present

## 2016-07-17 DIAGNOSIS — A084 Viral intestinal infection, unspecified: Principal | ICD-10-CM | POA: Diagnosis present

## 2016-07-17 DIAGNOSIS — R197 Diarrhea, unspecified: Secondary | ICD-10-CM | POA: Insufficient documentation

## 2016-07-17 DIAGNOSIS — L89304 Pressure ulcer of unspecified buttock, stage 4: Secondary | ICD-10-CM | POA: Diagnosis present

## 2016-07-17 DIAGNOSIS — G35 Multiple sclerosis: Secondary | ICD-10-CM | POA: Diagnosis not present

## 2016-07-17 DIAGNOSIS — G8220 Paraplegia, unspecified: Secondary | ICD-10-CM

## 2016-07-17 DIAGNOSIS — L089 Local infection of the skin and subcutaneous tissue, unspecified: Secondary | ICD-10-CM

## 2016-07-17 DIAGNOSIS — L8994 Pressure ulcer of unspecified site, stage 4: Secondary | ICD-10-CM

## 2016-07-17 DIAGNOSIS — Z79899 Other long term (current) drug therapy: Secondary | ICD-10-CM | POA: Diagnosis not present

## 2016-07-17 DIAGNOSIS — F329 Major depressive disorder, single episode, unspecified: Secondary | ICD-10-CM | POA: Diagnosis present

## 2016-07-17 DIAGNOSIS — E876 Hypokalemia: Secondary | ICD-10-CM | POA: Diagnosis not present

## 2016-07-17 DIAGNOSIS — G373 Acute transverse myelitis in demyelinating disease of central nervous system: Secondary | ICD-10-CM | POA: Diagnosis present

## 2016-07-17 DIAGNOSIS — K219 Gastro-esophageal reflux disease without esophagitis: Secondary | ICD-10-CM | POA: Diagnosis present

## 2016-07-17 DIAGNOSIS — B9689 Other specified bacterial agents as the cause of diseases classified elsewhere: Secondary | ICD-10-CM | POA: Diagnosis not present

## 2016-07-17 DIAGNOSIS — F4310 Post-traumatic stress disorder, unspecified: Secondary | ICD-10-CM | POA: Diagnosis present

## 2016-07-17 DIAGNOSIS — N760 Acute vaginitis: Secondary | ICD-10-CM | POA: Diagnosis present

## 2016-07-17 DIAGNOSIS — D649 Anemia, unspecified: Secondary | ICD-10-CM | POA: Diagnosis present

## 2016-07-17 LAB — I-STAT CG4 LACTIC ACID, ED
LACTIC ACID, VENOUS: 2.03 mmol/L — AB (ref 0.5–1.9)
LACTIC ACID, VENOUS: 0.8 mmol/L (ref 0.5–1.9)

## 2016-07-17 LAB — URINALYSIS, ROUTINE W REFLEX MICROSCOPIC
BILIRUBIN URINE: NEGATIVE
Glucose, UA: NEGATIVE mg/dL
HGB URINE DIPSTICK: NEGATIVE
Ketones, ur: NEGATIVE mg/dL
NITRITE: POSITIVE — AB
PH: 8 (ref 5.0–8.0)
Protein, ur: 100 mg/dL — AB
RBC / HPF: NONE SEEN RBC/hpf (ref 0–5)
SPECIFIC GRAVITY, URINE: 1.014 (ref 1.005–1.030)

## 2016-07-17 LAB — COMPREHENSIVE METABOLIC PANEL
ALK PHOS: 102 U/L (ref 38–126)
ALT: 7 U/L — AB (ref 14–54)
ANION GAP: 9 (ref 5–15)
AST: 14 U/L — ABNORMAL LOW (ref 15–41)
Albumin: 3.7 g/dL (ref 3.5–5.0)
BUN: 9 mg/dL (ref 6–20)
CALCIUM: 8.4 mg/dL — AB (ref 8.9–10.3)
CO2: 26 mmol/L (ref 22–32)
CREATININE: 0.44 mg/dL (ref 0.44–1.00)
Chloride: 101 mmol/L (ref 101–111)
Glucose, Bld: 98 mg/dL (ref 65–99)
Potassium: 3.1 mmol/L — ABNORMAL LOW (ref 3.5–5.1)
SODIUM: 136 mmol/L (ref 135–145)
TOTAL PROTEIN: 6.8 g/dL (ref 6.5–8.1)
Total Bilirubin: 1 mg/dL (ref 0.3–1.2)

## 2016-07-17 LAB — WET PREP, GENITAL
Sperm: NONE SEEN
TRICH WET PREP: NONE SEEN
Yeast Wet Prep HPF POC: NONE SEEN

## 2016-07-17 LAB — PREGNANCY, URINE: Preg Test, Ur: NEGATIVE

## 2016-07-17 LAB — CBC
HCT: 41.5 % (ref 36.0–46.0)
HEMOGLOBIN: 12.9 g/dL (ref 12.0–15.0)
MCH: 23.4 pg — ABNORMAL LOW (ref 26.0–34.0)
MCHC: 31.1 g/dL (ref 30.0–36.0)
MCV: 75.3 fL — ABNORMAL LOW (ref 78.0–100.0)
PLATELETS: 291 10*3/uL (ref 150–400)
RBC: 5.51 MIL/uL — AB (ref 3.87–5.11)
RDW: 17.4 % — ABNORMAL HIGH (ref 11.5–15.5)
WBC: 9.5 10*3/uL (ref 4.0–10.5)

## 2016-07-17 LAB — CK: Total CK: 59 U/L (ref 38–234)

## 2016-07-17 MED ORDER — SODIUM CHLORIDE 0.9 % IV BOLUS (SEPSIS)
1000.0000 mL | Freq: Once | INTRAVENOUS | Status: AC
  Administered 2016-07-17: 1000 mL via INTRAVENOUS

## 2016-07-17 MED ORDER — SODIUM CHLORIDE 0.9 % IV SOLN
INTRAVENOUS | Status: DC
  Administered 2016-07-18 (×2): via INTRAVENOUS
  Administered 2016-07-18: 100 mL/h via INTRAVENOUS

## 2016-07-17 MED ORDER — GABAPENTIN 300 MG PO CAPS
600.0000 mg | ORAL_CAPSULE | Freq: Three times a day (TID) | ORAL | Status: DC
  Administered 2016-07-18 – 2016-07-19 (×6): 600 mg via ORAL
  Filled 2016-07-17 (×6): qty 2

## 2016-07-17 MED ORDER — FLUTICASONE PROPIONATE 50 MCG/ACT NA SUSP: 2.0000 | Freq: Every day | NASAL | Status: DC | PRN

## 2016-07-17 MED ORDER — PIPERACILLIN-TAZOBACTAM 3.375 G IVPB
3.3750 g | Freq: Once | INTRAVENOUS | Status: AC
  Administered 2016-07-17: 3.375 g via INTRAVENOUS
  Filled 2016-07-17: qty 50

## 2016-07-17 MED ORDER — SENNOSIDES-DOCUSATE SODIUM 8.6-50 MG PO TABS
1.0000 | ORAL_TABLET | Freq: Two times a day (BID) | ORAL | Status: DC
  Administered 2016-07-18 – 2016-07-19 (×4): 1 via ORAL
  Filled 2016-07-17 (×4): qty 1

## 2016-07-17 MED ORDER — METRONIDAZOLE 500 MG PO TABS
500.0000 mg | ORAL_TABLET | Freq: Once | ORAL | Status: AC
  Administered 2016-07-17: 500 mg via ORAL
  Filled 2016-07-17: qty 1

## 2016-07-17 MED ORDER — ENOXAPARIN SODIUM 40 MG/0.4ML ~~LOC~~ SOLN
40.0000 mg | Freq: Every day | SUBCUTANEOUS | Status: DC
  Administered 2016-07-18 (×2): 40 mg via SUBCUTANEOUS
  Filled 2016-07-17 (×2): qty 0.4

## 2016-07-17 MED ORDER — ONDANSETRON 4 MG PO TBDP
4.0000 mg | ORAL_TABLET | Freq: Once | ORAL | Status: AC | PRN
  Administered 2016-07-17: 4 mg via ORAL
  Filled 2016-07-17: qty 1

## 2016-07-17 MED ORDER — LORATADINE 5 MG/5ML PO SYRP
5.0000 mg | ORAL_SOLUTION | Freq: Every day | ORAL | Status: DC
  Administered 2016-07-18 – 2016-07-19 (×2): 5 mg via ORAL
  Filled 2016-07-17 (×2): qty 5

## 2016-07-17 MED ORDER — CLINDAMYCIN PHOSPHATE 900 MG/50ML IV SOLN: 900.0000 mg | Freq: Once | INTRAVENOUS | Status: DC

## 2016-07-17 MED ORDER — PANTOPRAZOLE SODIUM 40 MG PO TBEC
40.0000 mg | DELAYED_RELEASE_TABLET | Freq: Every day | ORAL | Status: DC
  Administered 2016-07-18 – 2016-07-19 (×2): 40 mg via ORAL
  Filled 2016-07-17 (×2): qty 1

## 2016-07-17 MED ORDER — SERTRALINE HCL 100 MG PO TABS
100.0000 mg | ORAL_TABLET | Freq: Every day | ORAL | Status: DC
  Administered 2016-07-18 – 2016-07-19 (×2): 100 mg via ORAL
  Filled 2016-07-17 (×2): qty 1

## 2016-07-17 MED ORDER — BACLOFEN 20 MG PO TABS
20.0000 mg | ORAL_TABLET | Freq: Three times a day (TID) | ORAL | Status: DC
  Administered 2016-07-18 – 2016-07-19 (×6): 20 mg via ORAL
  Filled 2016-07-17: qty 1
  Filled 2016-07-17: qty 2
  Filled 2016-07-17 (×4): qty 1

## 2016-07-17 MED ORDER — ONDANSETRON HCL 4 MG/2ML IJ SOLN
4.0000 mg | Freq: Three times a day (TID) | INTRAMUSCULAR | Status: DC | PRN
  Administered 2016-07-19: 4 mg via INTRAVENOUS
  Filled 2016-07-17: qty 2

## 2016-07-17 MED ORDER — POTASSIUM CHLORIDE 20 MEQ/15ML (10%) PO SOLN
40.0000 meq | Freq: Once | ORAL | Status: AC
  Administered 2016-07-18: 40 meq via ORAL
  Filled 2016-07-17: qty 30

## 2016-07-17 MED ORDER — ACETAMINOPHEN 325 MG PO TABS
650.0000 mg | ORAL_TABLET | Freq: Four times a day (QID) | ORAL | Status: DC | PRN
  Administered 2016-07-19: 650 mg via ORAL
  Filled 2016-07-17: qty 2

## 2016-07-17 MED ORDER — ACETAMINOPHEN 325 MG PO TABS: 650.0000 mg | ORAL_TABLET | Freq: Once | ORAL | Status: DC

## 2016-07-17 MED ORDER — LAMOTRIGINE 25 MG PO TABS
125.0000 mg | ORAL_TABLET | Freq: Every day | ORAL | Status: DC
  Administered 2016-07-18 – 2016-07-19 (×2): 125 mg via ORAL
  Filled 2016-07-17 (×2): qty 1

## 2016-07-17 MED ORDER — ZOLPIDEM TARTRATE 5 MG PO TABS
5.0000 mg | ORAL_TABLET | Freq: Every evening | ORAL | Status: DC | PRN
Start: 2016-07-17 — End: 2016-07-19
  Administered 2016-07-18: 5 mg via ORAL
  Filled 2016-07-17: qty 1

## 2016-07-17 MED ORDER — METRONIDAZOLE 500 MG PO TABS
500.0000 mg | ORAL_TABLET | Freq: Two times a day (BID) | ORAL | Status: DC
  Administered 2016-07-18 – 2016-07-19 (×3): 500 mg via ORAL
  Filled 2016-07-17 (×3): qty 1

## 2016-07-17 MED ORDER — BISACODYL 10 MG RE SUPP: 10.0000 mg | Freq: Every day | RECTAL | Status: DC | PRN
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
## 2016-07-17 NOTE — ED Notes (Signed)
Lab drawn right anterior forearm by single veinopuncture specimen sent to [facility].

## 2016-07-17 NOTE — ED Notes (Signed)
Lab added on labs
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
## 2016-07-17 NOTE — ED Notes (Signed)
Light green top obtained right hand sent to [facility]

## 2016-07-17 NOTE — H&P (Signed)
History and Physical    [name] HBZ:[id number] DOB: [birth year] DOA: 07/17/2016  Referring MD/NP/PA:   PCP: Simona Huh, MD   Patient coming from:  The patient is coming from home.  At baseline, pt is dependent for most of ADL.   Chief Complaint: Nausea, vomiting, diarrhea, vaginal discharge.  HPI: [name] is a 36 y.o. 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  Pt states that due to no electricity in the last 2 days, she has been laying in a "hard mattress" at home. She reports nausea and vomited once. He also has diarrhea with three watery bowel movement today. No abdominal pain, fever or chills. She also reports vaginal discharge. She reports when [facility] nurse came this am to check on her pressure wound, she was told that her wound might be infected. Patient does not have chest pain, SOB, cough or symptoms of UTI.  ED Course: pt was found to have WBC 9.5, lactate of 2.03, CK 69, negative pregnancy test, electrolytes renal function okay, urinalysis positive which is most likely due to contamination per ED physician. Wet prep is positive for clue cells. X-ray of left hip is negative. Patient is admitted to [facility] bed as inpatient.  Review of Systems:   General: no fevers, chills, no changes in body weight, has fatigue HEENT: no blurry vision, hearing changes or sore throat Respiratory: no dyspnea, coughing, wheezing CV: no chest pain, no palpitations GI: has nausea, vomiting,  diarrhea, no constipation and abdominal pain, GU: no dysuria, burning on urination, increased urinary frequency, hematuria  Ext: no leg edema Neuro: has paraplegia Skin: has rash in left face, has pressure ulcers in buttock. MSK: No muscle spasm, no deformity, no limitation of range of movement in spin Heme: No easy bruising.  Travel  history: No recent long distant travel.  Allergy:  Allergies  Allergen Reactions  . Iodine Anaphylaxis  . Latex Anaphylaxis  . Other Anaphylaxis  . Shellfish-Derived Products Anaphylaxis    Reaction unknown  . Vancomycin Anaphylaxis  . Aspirin Other (See Comments)    Reaction unknown  . Strawberry Extract Hives and Rash    Past Medical History:  Diagnosis Date  . Bladder stones    Hx of  . Depression   . GERD (gastroesophageal reflux disease)   . Multiple sclerosis ([place])   . Paraplegia ([place])   . PTSD (post-traumatic stress disorder)   . Transverse myelitis [facility])     Past Surgical History:  Procedure Laterality Date  . CHOLECYSTECTOMY    . CYSTECTOMY W/ URETEROILEAL CONDUIT    . TONSILLECTOMY    . WISDOM TOOTH EXTRACTION      Social History:  reports that she has never smoked. She has never used smokeless tobacco. She reports that she does not drink alcohol or use drugs.  Family History:  Family History  Problem Relation Age of Onset  . Diabetes type II Father   . CAD Father   . Dementia Father   . Stroke Father   . Seizures Father   . Diabetes type II Mother   . Hypertension Mother   . Breast cancer Other   . Diabetes type II Other      Prior to Admission medications   Medication Sig Start Date End Date Taking? Authorizing Provider  acetaminophen (TYLENOL) 325 MG tablet Take 650 mg by mouth every 6 (six)  hours as needed for fever.   Yes Historical Provider, MD  baclofen (LIORESAL) 20 MG tablet Take 20 mg by mouth 3 (three) times daily.   Yes Historical Provider, MD  diazepam (VALIUM) 5 MG tablet Take 1-2 tablets (5-10 mg total) by mouth every 8 (eight) hours. Take 1 tablet in AM, 1 tablet in PM, and 2 tablets at bedtime Patient taking differently: Take 5-10 mg by mouth 3 (three) times daily. Take 1 tablet in AM, 1 tablet in PM, and 2 tablets at bedtime 04/02/13  Yes Robbie Lis, MD  fluticasone [facility]) 50 MCG/ACT nasal spray Place 2 sprays into the nose  daily as needed for allergies.    Yes Historical Provider, MD  gabapentin (NEURONTIN) 300 MG capsule Take 2 capsules (600 mg total) by mouth 3 (three) times daily. 01/13/16  Yes Pieter Partridge, DO  lamoTRIgine (LAMICTAL) 25 MG tablet Take 125 mg by mouth daily.   Yes Historical Provider, MD  loratadine (CLARITIN) 5 MG/5ML syrup Take 5 mg by mouth daily.   Yes Historical Provider, MD  lubiprostone (AMITIZA) 24 MCG capsule Take 24 mcg by mouth 2 (two) times daily with a meal.   Yes Historical Provider, MD  omeprazole (PRILOSEC) 40 MG capsule Take 40 mg by mouth daily.   Yes Historical Provider, MD  senna-docusate (SENOKOT-S) 8.6-50 MG per tablet Take 1 tablet by mouth 2 (two) times daily. Patient taking differently: Take 1 tablet by mouth 2 (two) times daily as needed for mild constipation or moderate constipation.  10/31/14  Yes Nishant Dhungel, MD  sertraline (ZOLOFT) 100 MG tablet Take 100 mg by mouth daily.   Yes Historical Provider, MD  bisacodyl (DULCOLAX) 10 MG suppository Place 1 suppository (10 mg total) rectally daily as needed for moderate constipation. Patient not taking: Reported on 07/17/2016 10/31/14   Flonnie Overman Dhungel, MD  ondansetron (ZOFRAN ODT) 4 MG disintegrating tablet 4mg  ODT q4 hours prn nausea/vomit Patient not taking: Reported on 07/17/2016 01/29/16   Deno Etienne, DO    Physical Exam: Vitals:   07/17/16 2000 07/17/16 2215 07/17/16 2300 07/18/16 0058  BP: 122/82 114/72 110/71 (!) 96/57  Pulse: 89 84 86 91  Resp: 18 13 14 16   Temp:    98.2 F (36.8 C)  TempSrc:    Oral  SpO2: 99% 100% 99% 100%  Weight:      Height:       General: Not in acute distress HEENT:       Eyes: PERRL, EOMI, no scleral icterus.       ENT: No discharge from the ears and nose, no pharynx injection, no tonsillar enlargement.        Neck: No JVD, no bruit, no mass felt. Heme: No neck lymph node enlargement. Cardiac: S1/S2, RRR, No murmurs, No gallops or rubs. Respiratory:  No rales, wheezing,  rhonchi or rubs. GI: Soft, nondistended, nontender, no rebound pain, no organomegaly, BS present. GU: No hematuria Ext: No pitting leg edema bilaterally. 2+DP/PT pulse bilaterally. Musculoskeletal: No joint deformities, No joint redness or warmth, no limitation of ROM in spin. Skin: has stage IV decubitus ulcer in left buttock Neuro: Alert, oriented X3, cranial nerves II-XII grossly intact, has paraplegia. Psych: Patient is not psychotic, no suicidal or hemocidal ideation.  Labs on Admission: I have personally reviewed following labs and imaging studies  CBC:  Recent Labs Lab 07/17/16 1927  WBC 9.5  HGB 12.9  HCT 41.5  MCV 75.3*  PLT 185   Basic Metabolic Panel:  Recent Labs Lab 07/17/16 2056  NA 136  K 3.1*  CL 101  CO2 26  GLUCOSE 98  BUN 9  CREATININE 0.44  CALCIUM 8.4*   GFR: Estimated Creatinine Clearance: 88 mL/min (by C-G formula based on SCr of 0.44 mg/dL). Liver Function Tests:  Recent Labs Lab 07/17/16 2056  AST 14*  ALT 7*  ALKPHOS 102  BILITOT 1.0  PROT 6.8  ALBUMIN 3.7   No results for input(s): LIPASE, AMYLASE in the last 168 hours. No results for input(s): AMMONIA in the last 168 hours. Coagulation Profile: No results for input(s): INR, PROTIME in the last 168 hours. Cardiac Enzymes:  Recent Labs Lab 07/17/16 2318  CKTOTAL 59   BNP (last 3 results) No results for input(s): PROBNP in the last 8760 hours. HbA1C: No results for input(s): HGBA1C in the last 72 hours. CBG: No results for input(s): GLUCAP in the last 168 hours. Lipid Profile: No results for input(s): CHOL, HDL, LDLCALC, TRIG, CHOLHDL, LDLDIRECT in the last 72 hours. Thyroid Function Tests: No results for input(s): TSH, T4TOTAL, FREET4, T3FREE, THYROIDAB in the last 72 hours. Anemia Panel: No results for input(s): VITAMINB12, FOLATE, FERRITIN, TIBC, IRON, RETICCTPCT in the last 72 hours. Urine analysis:    Component Value Date/Time   COLORURINE AMBER (A) 07/17/2016  1938   APPEARANCEUR CLOUDY (A) 07/17/2016 1938   LABSPEC 1.014 07/17/2016 1938   PHURINE 8.0 07/17/2016 1938   GLUCOSEU NEGATIVE 07/17/2016 1938   HGBUR NEGATIVE 07/17/2016 1938   BILIRUBINUR NEGATIVE 07/17/2016 1938   KETONESUR NEGATIVE 07/17/2016 1938   PROTEINUR 100 (A) 07/17/2016 1938   UROBILINOGEN 2.0 (H) 10/29/2014 1627   NITRITE POSITIVE (A) 07/17/2016 1938   LEUKOCYTESUR MODERATE (A) 07/17/2016 1938   Sepsis Labs: @LABRCNTIP (procalcitonin:4,lacticidven:4) ) Recent Results (from the past 240 hour(s))  Wet prep, genital     Status: Abnormal   Collection Time: 07/17/16  7:56 PM  Result Value Ref Range Status   Yeast Wet Prep HPF POC NONE SEEN NONE SEEN Final   Trich, Wet Prep NONE SEEN NONE SEEN Final   Clue Cells Wet Prep HPF POC PRESENT (A) NONE SEEN Final   WBC, Wet Prep HPF POC MANY (A) NONE SEEN Final   Sperm NONE SEEN  Final     Radiological Exams on Admission: Dg Hip Unilat W Or Wo Pelvis 2-3 Views Left  Result Date: 07/17/2016 CLINICAL DATA:  Left posterior hip nonhealing wound. EXAM: DG HIP (WITH OR WITHOUT PELVIS) 2-3V LEFT COMPARISON:  07/31/2015 FINDINGS: There is diffuse decreased bone mineralization. Moderate degenerate change of the hips which are stable. No definite fracture or dislocation. Mild degenerate change of the spine. IMPRESSION: No acute findings. 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 Electronically Signed   By: Marin Olp M.D.   On: 07/17/2016 21:23     EKG: Not done in ED  Assessment/Plan Principal Problem:   Nausea vomiting and diarrhea Active Problems:   Paraplegia (HCC)   Hypokalemia   BV (bacterial vaginosis)   Decubitus ulcer of buttock, stage 4 (HCC)   Nausea vomiting and diarrhea: Etiology is not clear. Likely due to viral gastroenteritis, but need to rule out other possibilities such as C. difficile colitis. Currently hemodynamically stable. Clinically nonseptic.  -will admit to med-surg bed as inpt -EDP ordered  C diff pcr and GI pathogen panel, will follow up the results -IVF: 2L ns, then 100 cc/h -When necessary Zofran for nausea.  BV (bacterial vaginosis): -Flayl 00 mg twice a day -f/u  GC/chlamydia prob  Paraplegia:  -Continue   Decubitus ulcer of buttock, stage 4: not sure if it is infected. -pt was given one dose of zosyn in ED -will consult to wound care  Hypokalemia: K=3.1 on admission. - Repleted - Check Mg level'   DVT ppx: SQ Lovenox Code Status: Full code Family Communication:  Yes, patient's mother  at bed side Disposition Plan:  Anticipate discharge back to previous home environment Consults called: none  Admission status: medical floor/ionpt  Date of Service 07/18/2016    Ivor Costa [facility] Pager [phone]  If 7PM-7AM, please contact night-coverage [email or web address] Password [email or web address] 07/18/2016, 3:05 AM

## 2016-07-17 NOTE — ED Triage Notes (Signed)
Pt reports due to no electricity in the last 2 days, she has been laying in a "hard mattress" at home, pt is paraplegic and is bed and WC bound.  She reports when [facility] nurse came this am to check on her pressure wound, she was told that her wound might be infected.  She also reports vaginal discharge and n/v.  Sxs onset was Sunday

## 2016-07-18 LAB — GC/CHLAMYDIA PROBE AMP (~~LOC~~) NOT AT ARMC
CHLAMYDIA, DNA PROBE: NEGATIVE
Neisseria Gonorrhea: NEGATIVE

## 2016-07-18 LAB — BASIC METABOLIC PANEL
ANION GAP: 7 (ref 5–15)
BUN: 7 mg/dL (ref 6–20)
CO2: 24 mmol/L (ref 22–32)
Calcium: 8 mg/dL — ABNORMAL LOW (ref 8.9–10.3)
Chloride: 107 mmol/L (ref 101–111)
Creatinine, Ser: <0.3 mg/dL — ABNORMAL LOW (ref 0.44–1.00)
Glucose, Bld: 87 mg/dL (ref 65–99)
POTASSIUM: 4 mmol/L (ref 3.5–5.1)
SODIUM: 138 mmol/L (ref 135–145)

## 2016-07-18 LAB — CBC
HEMATOCRIT: 33 % — AB (ref 36.0–46.0)
Hemoglobin: 10.1 g/dL — ABNORMAL LOW (ref 12.0–15.0)
MCH: 23.3 pg — ABNORMAL LOW (ref 26.0–34.0)
MCHC: 30.6 g/dL (ref 30.0–36.0)
MCV: 76 fL — ABNORMAL LOW (ref 78.0–100.0)
Platelets: 186 10*3/uL (ref 150–400)
RBC: 4.34 MIL/uL (ref 3.87–5.11)
RDW: 17.2 % — AB (ref 11.5–15.5)
WBC: 5.7 10*3/uL (ref 4.0–10.5)

## 2016-07-18 LAB — C DIFFICILE QUICK SCREEN W PCR REFLEX
C DIFFICILE (CDIFF) INTERP: NOT DETECTED
C Diff antigen: NEGATIVE
C Diff toxin: NEGATIVE

## 2016-07-18 LAB — GLUCOSE, CAPILLARY: Glucose-Capillary: 78 mg/dL (ref 65–99)

## 2016-07-18 LAB — PROCALCITONIN: Procalcitonin: 0.12 ng/mL

## 2016-07-18 LAB — MAGNESIUM: MAGNESIUM: 1.9 mg/dL (ref 1.7–2.4)

## 2016-07-18 MED ORDER — DIAZEPAM 5 MG PO TABS
5.0000 mg | ORAL_TABLET | ORAL | Status: DC
  Administered 2016-07-18 – 2016-07-19 (×4): 5 mg via ORAL
  Filled 2016-07-18 (×4): qty 1

## 2016-07-18 MED ORDER — LUBIPROSTONE 24 MCG PO CAPS
24.0000 ug | ORAL_CAPSULE | Freq: Two times a day (BID) | ORAL | Status: DC
  Administered 2016-07-18 – 2016-07-19 (×3): 24 ug via ORAL
  Filled 2016-07-18 (×3): qty 1

## 2016-07-18 MED ORDER — DIAZEPAM 5 MG PO TABS
10.0000 mg | ORAL_TABLET | Freq: Every day | ORAL | Status: DC
  Administered 2016-07-18: 10 mg via ORAL
  Filled 2016-07-18: qty 2

## 2016-07-18 MED ORDER — SODIUM CHLORIDE 0.9 % IV BOLUS (SEPSIS)
500.0000 mL | Freq: Once | INTRAVENOUS | Status: AC
  Administered 2016-07-18: 500 mL via INTRAVENOUS

## 2016-07-18 NOTE — Consult Note (Addendum)
[place] Nurse wound consult note Reason for Consult:pressure injury left ischium Wound type: stage IV  Pressure Injury POA: Yes Measurement: 4cm x 3cm x 4.5cm Wound TYY:PEJY, non granulating Drainage (amount, consistency, odor) Moderate, no odor noted Periwound:intact Dressing procedure/placement/frequency:I have provided nurses with orders for Cleanse wound to left ischium with NS and pat gently dry.  Gently fill wound bed with Aquacel Ag+ Kellie Simmering [phone]) and cover with Allevyn, peel back foam and recover after wound care, perform daily, Change foam Q3D and prn soiling.  We will not follow, but will remain available to this patient, to nursing, and the medical and/or surgical teams.  Please re-consult if we need to assist further.    Fara Olden, RN-C, WTA-C W[facility] Treatment [facility]

## 2016-07-18 NOTE — Progress Notes (Signed)
PROGRESS NOTE    [name]  LPF:[id number] DOB: 1980/10/28 DOA: 07/17/2016 PCP: Simona Huh, MD   Chief Complaint  Patient presents with  . pressure wound  . Vaginal Discharge    Brief Narrative:  HPI On 07/17/2016 by Dr. Ivor Costa [name] is a 36 y.o. 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  Pt states that due to no electricity in the last 2 days, she has been laying in a "hard mattress" at home. She reports nausea and vomited once. He also has diarrhea with three watery bowel movement today. No abdominal pain, fever or chills. She also reports vaginal discharge. She reports when [facility] nurse came this am to check on her pressure wound, she was told that her wound might be infected. Patient does not have chest pain, SOB, cough or symptoms of UTI. Assessment & Plan   Nausea vomiting and diarrhea -Etiology unclear, possible viral gastroenteritis vs related to patient's home medications of Amitiza, Senokot-S, dulcolax -C. Diff ordered, however, patient has not had another bowel movement since admission and she has been on stool softeners at home as she has chronic constipation and is on medications that cause constipation -Continue home meds and monitor -if patient has bowel movement, will send for testing -Continue zofran PRN for nausea  BV (bacterial vaginosis) -Wet prep +clue cells -Continue flagyl  -pending GC/chlamydia prob  Paraplegia/ Multiple sclerosis -Continue baclofen, valium PRN  Decubitus ulcer of buttock, stage 4 -Present on admission -Afebrile, no leukocytosis on admission -Given zosyn in ED -Wound care consulted   Hypokalemia -possibly related to GI losses -Resolved with supplementation -Monitor BMP  Chronic anemia -has been microcytic/normocytic in the past -Hemoglobin at  baseline around 9-10, currently 10.1 -Continue to monitor CBC  DVT Prophylaxis  Lovenox  Code Status: Full  Family Communication: Mother at bedside  Disposition Plan: Admitted. Continue to monitor, pending bowel movement  Consultants None  Procedures  None  Antibiotics   Anti-infectives    Start     Dose/Rate Route Frequency Ordered Stop   07/18/16 1000  metroNIDAZOLE (FLAGYL) tablet 500 mg     500 mg Oral Every 12 hours 07/17/16 2318     07/17/16 2215  piperacillin-tazobactam (ZOSYN) IVPB 3.375 g     3.375 g 12.5 mL/hr over 240 Minutes Intravenous  Once 07/17/16 2201 07/17/16 2259   07/17/16 2200  metroNIDAZOLE (FLAGYL) tablet 500 mg     500 mg Oral  Once 07/17/16 2149 07/17/16 2220   07/17/16 2200  clindamycin (CLEOCIN) IVPB 900 mg  Status:  Discontinued     900 mg 100 mL/hr over 30 Minutes Intravenous  Once 07/17/16 2150 07/17/16 2201      Subjective:   [name] seen and examined today.  States she is no longer having diarrhea. Denies chest pain, shortness of breath, dizziness, headache. Denies further abdominal pain, nausea, or vomiting.   Objective:   Vitals:   07/17/16 1927 07/18/16 0058 07/18/16 0505 07/18/16 1418  BP:  (!) 96/57 93/60 108/69  Pulse:  91 87 99  Resp:  16 16 18   Temp:  98.2 F (36.8 C) 97.7 F (36.5 C) 98.5 F (36.9 C)  TempSrc:  Oral Oral Oral  SpO2:  100% 99% 100%  Weight: 63.5 kg (140 lb)     Height: 5\' 3"  (1.6 m)       Intake/Output  Summary (Last 24 hours) at 07/18/16 1448 Last data filed at 07/18/16 1419  Gross per 24 hour  Intake             1960 ml  Output              800 ml  Net             1160 ml   Filed Weights   07/17/16 1536 07/17/16 1927  Weight: 63.5 kg (140 lb) 63.5 kg (140 lb)    Exam  General: Well developed, well nourished, NAD, appears stated age  HEENT: NCAT, mucous membranes moist.   Cardiovascular: S1 S2 auscultated, no rubs, murmurs or gallops. Regular rate and rhythm.  Respiratory: Clear  to auscultation bilaterally with equal chest rise  Abdomen: Soft, nontender, nondistended, + bowel sounds  Extremities: warm dry without cyanosis clubbing or edema  Neuro: AAOx3, paraplegia, otherwise nonfocal  Psych: Normal affect and demeanor    Data Reviewed: I have personally reviewed following labs and imaging studies  CBC:  Recent Labs Lab 07/17/16 1927 07/18/16 0315  WBC 9.5 5.7  HGB 12.9 10.1*  HCT 41.5 33.0*  MCV 75.3* 76.0*  PLT 291 248   Basic Metabolic Panel:  Recent Labs Lab 07/17/16 2056 07/18/16 0315  NA 136 138  K 3.1* 4.0  CL 101 107  CO2 26 24  GLUCOSE 98 87  BUN 9 7  CREATININE 0.44 <0.30*  CALCIUM 8.4* 8.0*  MG  --  1.9   GFR: CrCl cannot be calculated (This lab value cannot be used to calculate CrCl because it is not a number: <0.30). Liver Function Tests:  Recent Labs Lab 07/17/16 2056  AST 14*  ALT 7*  ALKPHOS 102  BILITOT 1.0  PROT 6.8  ALBUMIN 3.7   No results for input(s): LIPASE, AMYLASE in the last 168 hours. No results for input(s): AMMONIA in the last 168 hours. Coagulation Profile: No results for input(s): INR, PROTIME in the last 168 hours. Cardiac Enzymes:  Recent Labs Lab 07/17/16 2318  CKTOTAL 59   BNP (last 3 results) No results for input(s): PROBNP in the last 8760 hours. HbA1C: No results for input(s): HGBA1C in the last 72 hours. CBG:  Recent Labs Lab 07/18/16 0754  GLUCAP 78   Lipid Profile: No results for input(s): CHOL, HDL, LDLCALC, TRIG, CHOLHDL, LDLDIRECT in the last 72 hours. Thyroid Function Tests: No results for input(s): TSH, T4TOTAL, FREET4, T3FREE, THYROIDAB in the last 72 hours. Anemia Panel: No results for input(s): VITAMINB12, FOLATE, FERRITIN, TIBC, IRON, RETICCTPCT in the last 72 hours. Urine analysis:    Component Value Date/Time   COLORURINE AMBER (A) 07/17/2016 1938   APPEARANCEUR CLOUDY (A) 07/17/2016 1938   LABSPEC 1.014 07/17/2016 1938   PHURINE 8.0 07/17/2016 1938    GLUCOSEU NEGATIVE 07/17/2016 1938   HGBUR NEGATIVE 07/17/2016 1938   BILIRUBINUR NEGATIVE 07/17/2016 1938   KETONESUR NEGATIVE 07/17/2016 1938   PROTEINUR 100 (A) 07/17/2016 1938   UROBILINOGEN 2.0 (H) 10/29/2014 1627   NITRITE POSITIVE (A) 07/17/2016 1938   LEUKOCYTESUR MODERATE (A) 07/17/2016 1938   Sepsis Labs: @LABRCNTIP (procalcitonin:4,lacticidven:4)  ) Recent Results (from the past 240 hour(s))  Wet prep, genital     Status: Abnormal   Collection Time: 07/17/16  7:56 PM  Result Value Ref Range Status   Yeast Wet Prep HPF POC NONE SEEN NONE SEEN Final   Trich, Wet Prep NONE SEEN NONE SEEN Final   Clue Cells Wet Prep HPF POC PRESENT (  A) NONE SEEN Final   WBC, Wet Prep HPF POC MANY (A) NONE SEEN Final   Sperm NONE SEEN  Final  Wound or Superficial Culture     Status: None (Preliminary result)   Collection Time: 07/17/16  9:49 PM  Result Value Ref Range Status   Specimen Description WOUND  Final   Special Requests NONE  Final   Gram Stain PENDING  Incomplete   Culture   Final    TOO YOUNG TO READ Performed at [facility], [place] [address], [place], [place] [address]    Report Status PENDING  Incomplete      Radiology Studies: Dg Hip Unilat W Or Wo Pelvis 2-3 Views Left  Result Date: 07/17/2016 CLINICAL DATA:  Left posterior hip nonhealing wound. EXAM: DG HIP (WITH OR WITHOUT PELVIS) 2-3V LEFT COMPARISON:  07/31/2015 FINDINGS: There is diffuse decreased bone mineralization. Moderate degenerate change of the hips which are stable. No definite fracture or dislocation. Mild degenerate change of the spine. IMPRESSION: No acute findings. 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 Electronically Signed   By: Marin Olp M.D.   On: 07/17/2016 21:23     Scheduled Meds: . baclofen  20 mg Oral TID  . diazepam  10 mg Oral QHS  . diazepam  5 mg Oral 2 times per day  . enoxaparin (LOVENOX) injection  40 mg Subcutaneous QHS  . gabapentin  600 mg Oral TID  .  lamoTRIgine  125 mg Oral Daily  . loratadine  5 mg Oral Daily  . lubiprostone  24 mcg Oral BID WC  . metroNIDAZOLE  500 mg Oral Q12H  . pantoprazole  40 mg Oral Daily  . senna-docusate  1 tablet Oral BID  . sertraline  100 mg Oral Daily   Continuous Infusions: . sodium chloride 100 mL/hr at 07/18/16 1312     LOS: 1 day   Time Spent in minutes   30 minutes  [name] D.O. on 07/18/2016 at 2:48 PM  Between 7am to 7pm - Pager - [phone]  After 7pm go to [email or web address] - password [email or web address]  And look for the night coverage person covering for me after hours  [facility] Office  [phone]

## 2016-07-19 DIAGNOSIS — L89304 Pressure ulcer of unspecified buttock, stage 4: Secondary | ICD-10-CM

## 2016-07-19 LAB — GASTROINTESTINAL PANEL BY PCR, STOOL (REPLACES STOOL CULTURE)
ADENOVIRUS F40/41: NOT DETECTED
ASTROVIRUS: NOT DETECTED
Campylobacter species: NOT DETECTED
Cryptosporidium: NOT DETECTED
Cyclospora cayetanensis: NOT DETECTED
ENTAMOEBA HISTOLYTICA: NOT DETECTED
ENTEROAGGREGATIVE E COLI (EAEC): NOT DETECTED
ENTEROPATHOGENIC E COLI (EPEC): NOT DETECTED
ENTEROTOXIGENIC E COLI (ETEC): NOT DETECTED
Giardia lamblia: NOT DETECTED
NOROVIRUS GI/GII: NOT DETECTED
Plesimonas shigelloides: NOT DETECTED
Rotavirus A: NOT DETECTED
SAPOVIRUS (I, II, IV, AND V): NOT DETECTED
SHIGA LIKE TOXIN PRODUCING E COLI (STEC): NOT DETECTED
Salmonella species: NOT DETECTED
Shigella/Enteroinvasive E coli (EIEC): NOT DETECTED
VIBRIO CHOLERAE: NOT DETECTED
Vibrio species: NOT DETECTED
Yersinia enterocolitica: NOT DETECTED

## 2016-07-19 LAB — BASIC METABOLIC PANEL
ANION GAP: 10 (ref 5–15)
BUN: <5 mg/dL — ABNORMAL LOW (ref 6–20)
CALCIUM: 8.3 mg/dL — AB (ref 8.9–10.3)
CO2: 22 mmol/L (ref 22–32)
Chloride: 107 mmol/L (ref 101–111)
Creatinine, Ser: 0.36 mg/dL — ABNORMAL LOW (ref 0.44–1.00)
GFR calc Af Amer: >60 mL/min (ref >60)
GFR calc non Af Amer: >60 mL/min (ref >60)
Glucose, Bld: 65 mg/dL (ref 65–99)
POTASSIUM: 3.4 mmol/L — AB (ref 3.5–5.1)
Sodium: 139 mmol/L (ref 135–145)

## 2016-07-19 LAB — GLUCOSE, CAPILLARY
GLUCOSE-CAPILLARY: 64 mg/dL — AB (ref 65–99)
GLUCOSE-CAPILLARY: 60 mg/dL — AB (ref 65–99)

## 2016-07-19 LAB — CBC
HEMATOCRIT: 33.2 % — AB (ref 36.0–46.0)
Hemoglobin: 9.7 g/dL — ABNORMAL LOW (ref 12.0–15.0)
MCH: 22 pg — ABNORMAL LOW (ref 26.0–34.0)
MCHC: 29.2 g/dL — ABNORMAL LOW (ref 30.0–36.0)
MCV: 75.3 fL — AB (ref 78.0–100.0)
PLATELETS: 218 10*3/uL (ref 150–400)
RBC: 4.41 MIL/uL (ref 3.87–5.11)
RDW: 17.1 % — ABNORMAL HIGH (ref 11.5–15.5)
WBC: 5.2 10*3/uL (ref 4.0–10.5)

## 2016-07-19 MED ORDER — METRONIDAZOLE 500 MG PO TABS: 500.0000 mg | ORAL_TABLET | Freq: Two times a day (BID) | ORAL | 0 refills | Status: DC

## 2016-07-19 MED ORDER — ONDANSETRON 4 MG PO TBDP
ORAL_TABLET | ORAL | 0 refills | Status: DC
Start: 2016-07-19 — End: 2016-11-09

## 2016-07-19 MED ORDER — POTASSIUM CHLORIDE CRYS ER 20 MEQ PO TBCR
40.0000 meq | EXTENDED_RELEASE_TABLET | Freq: Once | ORAL | Status: AC
  Administered 2016-07-19: 40 meq via ORAL
  Filled 2016-07-19: qty 2

## 2016-07-19 NOTE — Progress Notes (Signed)
Patient has orders to be discharged, mother is sole caretaker and is currently being seen in the ED due to complaints of asthma.  Case Manager and MD made aware of current situation

## 2016-07-19 NOTE — Discharge Summary (Signed)
Physician Discharge Summary  [name] QBH:[id number] DOB: January 11, 1981 DOA: 07/17/2016  PCP: Simona Huh, MD  Admit date: 07/17/2016 Discharge date: 07/19/2016  Time spent: 45 minutes  Recommendations for Outpatient Follow-up:  Patient will be discharged to home.  Patient will need to follow up with primary care provider within one week of discharge, repeat BMP.  Patient should continue medications as prescribed.  Patient should follow a soft diet.   Discharge Diagnoses:  Nausea vomiting and diarrhea BV (bacterial vaginosis) Paraplegia/ Multiple sclerosis Decubitus ulcer of buttock, stage 4 Hypokalemia Chronic anemia  Discharge Condition: Stable  Diet recommendation: Soft diet, advance to regular as tolerated  Filed Weights   07/17/16 1536 07/17/16 1927  Weight: 63.5 kg (140 lb) 63.5 kg (140 lb)    History of present illness:  On 07/17/2016 by Dr. Ivor Costa Veronica E Myersis a 36 y.o.femalewith medical history significant of paraplegiadue to MS and the transverse myelitis, GERD, depression, chronic decubitus ulcer, who presents with nausea, vomiting, diarrhea and vaginal discharge.  Pt states that due to no electricity in the last 2 days, she has been laying in a "hard mattress" at home. She reports nausea and vomited once. He also has diarrhea with threewatery bowel movement today. No abdominal pain, fever or chills. She also reports vaginal discharge. She reports when [name] nurse came this am to check on her pressure wound, she was told that her wound might be infected. Patient does not have chest pain, SOB, cough or symptoms of UTI.  Hospital Course:  Nausea vomiting and diarrhea -Etiology unclear, possible viral gastroenteritis vs related to patient's home medications of Amitiza, Senokot-S, dulcolax -C. Diff negative -GI pathogen panel pending  -Nausea and vomiting have resolved -Diarrhea has resolved, patient did have a bowel movement after receiving Amitiza    -Able to tolerate soft diet  BV (bacterial vaginosis) -Wet prep +clue cells -Continue flagyl - 500mg  BID for total of 7 days -GC/chlamydia prob negative  Paraplegia/ Multiple sclerosis -Continue baclofen, valium PRN  Decubitus ulcer of buttock, stage 4 -Present on admission -Afebrile, no leukocytosis on admission -Given zosyn in ED -Wound care consulted   Hypokalemia -possibly related to GI losses -Given supplementation -Repeat BMP in one week  Chronic anemia -has been microcytic/normocytic in the past -Hemoglobin at baseline around 9-10, currently 9.7  Consultants None  Procedures  None  Discharge Exam: Vitals:   07/19/16 0900 07/19/16 1120  BP: 112/81 95/60  Pulse: 96   Resp: 17   Temp: 98 F (36.7 C)    States she is no longer having diarrhea.  Was able to have a bowel movement yesterday after taking Amitiza. Denies chest pain, shortness of breath, dizziness, headache. Denies further abdominal pain, nausea, or vomiting.   Exam  General: Well developed, well nourished, NAD, appears stated age  36: NCAT, mucous membranes moist.   Cardiovascular: S1 S2 auscultated, RRR, no murmurs  Respiratory: Clear to auscultation bilaterally   Abdomen: Soft, nontender, nondistended, + bowel sounds  Extremities: warm dry without cyanosis clubbing or edema  Neuro: AAOx3, paraplegia, otherwise nonfocal  Psych: Normal affect and demeanor, pleasant   Discharge Instructions Discharge Instructions    Discharge instructions    Complete by:  As directed    Patient will be discharged to home.  Patient will need to follow up with primary care provider within one week of discharge, repeat BMP.  Patient should continue medications as prescribed.  Patient should follow a soft diet.     Current Discharge  Medication List    START taking these medications   Details  metroNIDAZOLE (FLAGYL) 500 MG tablet Take 1 tablet (500 mg total) by mouth every 12 (twelve)  hours. Qty: 12 tablet, Refills: 0      CONTINUE these medications which have CHANGED   Details  ondansetron (ZOFRAN ODT) 4 MG disintegrating tablet 4mg  ODT q4 hours prn nausea/vomit Qty: 20 tablet, Refills: 0      CONTINUE these medications which have NOT CHANGED   Details  acetaminophen (TYLENOL) 325 MG tablet Take 650 mg by mouth every 6 (six) hours as needed for fever.    baclofen (LIORESAL) 20 MG tablet Take 20 mg by mouth 3 (three) times daily.    diazepam (VALIUM) 5 MG tablet Take 1-2 tablets (5-10 mg total) by mouth every 8 (eight) hours. Take 1 tablet in AM, 1 tablet in PM, and 2 tablets at bedtime Qty: 30 tablet, Refills: 0    fluticasone (FLONASE) 50 MCG/ACT nasal spray Place 2 sprays into the nose daily as needed for allergies.     gabapentin (NEURONTIN) 300 MG capsule Take 2 capsules (600 mg total) by mouth 3 (three) times daily. Qty: 180 capsule, Refills: 5    lamoTRIgine (LAMICTAL) 25 MG tablet Take 125 mg by mouth daily.    loratadine (CLARITIN) 5 MG/5ML syrup Take 5 mg by mouth daily.    lubiprostone (AMITIZA) 24 MCG capsule Take 24 mcg by mouth 2 (two) times daily with a meal.    omeprazole (PRILOSEC) 40 MG capsule Take 40 mg by mouth daily.    senna-docusate (SENOKOT-S) 8.6-50 MG per tablet Take 1 tablet by mouth 2 (two) times daily. Qty: 30 tablet, Refills: 0    sertraline (ZOLOFT) 100 MG tablet Take 100 mg by mouth daily.    bisacodyl (DULCOLAX) 10 MG suppository Place 1 suppository (10 mg total) rectally daily as needed for moderate constipation. Qty: 12 suppository, Refills: 0       Allergies  Allergen Reactions  . Iodine Anaphylaxis  . Latex Anaphylaxis  . Other Anaphylaxis  . Shellfish-Derived Products Anaphylaxis    Reaction unknown  . Vancomycin Anaphylaxis  . Aspirin Other (See Comments)    Reaction unknown  . Strawberry Extract Hives and Rash   Follow-up Information    Simona Huh, MD. Schedule an appointment as soon as possible  for a visit in 1 week(s).   Specialty:  Family Medicine Why:  Hospital follow up Contact information: [address] [place] [address] [phone]            The results of significant diagnostics from this hospitalization (including imaging, microbiology, ancillary and laboratory) are listed below for reference.    Significant Diagnostic Studies: Dg Hip Unilat W Or Wo Pelvis 2-3 Views Left  Result Date: 07/17/2016 CLINICAL DATA:  Left posterior hip nonhealing wound. EXAM: DG HIP (WITH OR WITHOUT PELVIS) 2-3V LEFT COMPARISON:  07/31/2015 FINDINGS: There is diffuse decreased bone mineralization. Moderate degenerate change of the hips which are stable. No definite fracture or dislocation. Mild degenerate change of the spine. IMPRESSION: No acute findings. 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 Electronically Signed   By: Marin Olp M.D.   On: 07/17/2016 21:23    Microbiology: Recent Results (from the past 240 hour(s))  Urine culture     Status: Abnormal (Preliminary result)   Collection Time: 07/17/16  7:38 PM  Result Value Ref Range Status   Specimen Description URINE, [facility]  Final  Special Requests NONE  Final   Culture (A)  Final    >=100,000 COLONIES/mL PROTEUS MIRABILIS SUSCEPTIBILITIES TO FOLLOW Performed at [facility], [address] [place], [place] [address]    Report Status PENDING  Incomplete  Blood culture (routine x 2)     Status: None (Preliminary result)   Collection Time: 07/17/16  7:38 PM  Result Value Ref Range Status   Specimen Description BLOOD BLOOD LEFT ARM  Final   Special Requests   Final    IN PEDIATRIC BOTTLE Blood Culture results may not be optimal due to an inadequate volume of blood received in culture bottles   Culture   Final    NO GROWTH 2 DAYS Performed at [facility], [place] [address] [place], [place] [address]    Report Status PENDING  Incomplete  Wet prep, genital     Status:  Abnormal   Collection Time: 07/17/16  7:56 PM  Result Value Ref Range Status   Yeast Wet Prep HPF POC NONE SEEN NONE SEEN Final   Trich, Wet Prep NONE SEEN NONE SEEN Final   Clue Cells Wet Prep HPF POC PRESENT (A) NONE SEEN Final   WBC, Wet Prep HPF POC MANY (A) NONE SEEN Final   Sperm NONE SEEN  Final  Blood culture (routine x 2)     Status: None (Preliminary result)   Collection Time: 07/17/16  8:56 PM  Result Value Ref Range Status   Specimen Description BLOOD LEFT HAND  Final   Special Requests IN PEDIATRIC BOTTLE Blood Culture adequate volume  Final   Culture   Final    NO GROWTH 1 DAY Performed at [facility], [place] [address] [place], [place] [address]    Report Status PENDING  Incomplete  Wound or Superficial Culture     Status: None (Preliminary result)   Collection Time: 07/17/16  9:49 PM  Result Value Ref Range Status   Specimen Description WOUND  Final   Special Requests NONE  Final   Gram Stain   Final    ABUNDANT WBC PRESENT,BOTH PMN AND MONONUCLEAR NO ORGANISMS SEEN    Culture   Final    CULTURE REINCUBATED FOR BETTER GROWTH Performed at [facility], [address] [place], [place] [address]    Report Status PENDING  Incomplete  C difficile quick screen w PCR reflex     Status: None   Collection Time: 07/18/16  6:00 PM  Result Value Ref Range Status   C Diff antigen NEGATIVE NEGATIVE Final   C Diff toxin NEGATIVE NEGATIVE Final   C Diff interpretation No C. difficile detected.  Final     Labs: Basic Metabolic Panel:  Recent Labs Lab 07/17/16 2056 07/18/16 0315 07/19/16 0452  NA 136 138 139  K 3.1* 4.0 3.4*  CL 101 107 107  CO2 26 24 22   GLUCOSE 98 87 65  BUN 9 7 <5*  CREATININE 0.44 <0.30* 0.36*  CALCIUM 8.4* 8.0* 8.3*  MG  --  1.9  --    Liver Function Tests:  Recent Labs Lab 07/17/16 2056  AST 14*  ALT 7*  ALKPHOS 102  BILITOT 1.0  PROT 6.8  ALBUMIN 3.7   No results for input(s): LIPASE, AMYLASE in the last 168  hours. No results for input(s): AMMONIA in the last 168 hours. CBC:  Recent Labs Lab 07/17/16 1927 07/18/16 0315 07/19/16 0452  WBC 9.5 5.7 5.2  HGB 12.9 10.1* 9.7*  HCT 41.5 33.0* 33.2*  MCV 75.3* 76.0* 75.3*  PLT 291 186 218   Cardiac Enzymes:  Recent Labs Lab 07/17/16 2318  CKTOTAL 59   BNP: BNP (last 3 results) No results for input(s): BNP in the last 8760 hours.  ProBNP (last 3 results) No results for input(s): PROBNP in the last 8760 hours.  CBG:  Recent Labs Lab 07/18/16 0754 07/19/16 0804 07/19/16 0935  GLUCAP 78 64* 60*       Signed:  Cristal Ford  [facility] 07/19/2016, 1:00 PM

## 2016-07-19 NOTE — Care Management Note (Signed)
Case Management Note  Patient Details  Name: [name] MRN: [id number] Date of Birth: [birth year]  Subjective/Objective:  36 y/o f admitted w/n/v. From home.                  Action/Plan:d/c home.   Expected Discharge Date:  07/19/16               Expected Discharge Plan:  Home/Self Care  In-House Referral:     Discharge planning Services  CM Consult  Post Acute Care Choice:    Choice offered to:     DME Arranged:    DME Agency:     HH Arranged:    HH Agency:     Status of Service:  Completed, signed off  If discussed at [employer] of Stay Meetings, dates discussed:    Additional Comments:  Dessa Phi, RN 07/19/2016, 2:05 PM

## 2016-07-19 NOTE — Discharge Instructions (Signed)
Bacterial Vaginosis Bacterial vaginosis is a vaginal infection that occurs when the normal balance of bacteria in the vagina is disrupted. It results from an overgrowth of certain bacteria. This is the most common vaginal infection among women ages 15-44. Because bacterial vaginosis increases your risk for STIs (sexually transmitted infections), getting treated can help reduce your risk for chlamydia, gonorrhea, herpes, and HIV (human immunodeficiency virus). Treatment is also important for preventing complications in pregnant women, because this condition can cause an early (premature) delivery. What are the causes? This condition is caused by an increase in harmful bacteria that are normally present in small amounts in the vagina. However, the reason that the condition develops is not fully understood. What increases the risk? The following factors may make you more likely to develop this condition:  Having a new sexual partner or multiple sexual partners.  Having unprotected sex.  Douching.  Having an intrauterine device (IUD).  Smoking.  Drug and alcohol abuse.  Taking certain antibiotic medicines.  Being pregnant.  You cannot get bacterial vaginosis from toilet seats, bedding, swimming pools, or contact with objects around you. What are the signs or symptoms? Symptoms of this condition include:  Grey or white vaginal discharge. The discharge can also be watery or foamy.  A fish-like odor with discharge, especially after sexual intercourse or during menstruation.  Itching in and around the vagina.  Burning or pain with urination.  Some women with bacterial vaginosis have no signs or symptoms. How is this diagnosed? This condition is diagnosed based on:  Your medical history.  A physical exam of the vagina.  Testing a sample of vaginal fluid under a microscope to look for a large amount of bad bacteria or abnormal cells. Your health care provider may use a cotton swab  or a small wooden spatula to collect the sample.  How is this treated? This condition is treated with antibiotics. These may be given as a pill, a vaginal cream, or a medicine that is put into the vagina (suppository). If the condition comes back after treatment, a second round of antibiotics may be needed. Follow these instructions at home: Medicines  Take over-the-counter and prescription medicines only as told by your health care provider.  Take or use your antibiotic as told by your health care provider. Do not stop taking or using the antibiotic even if you start to feel better. General instructions  If you have a female sexual partner, tell her that you have a vaginal infection. She should see her health care provider and be treated if she has symptoms. If you have a female sexual partner, he does not need treatment.  During treatment: ? Avoid sexual activity until you finish treatment. ? Do not douche. ? Avoid alcohol as directed by your health care provider. ? Avoid breastfeeding as directed by your health care provider.  Drink enough water and fluids to keep your urine clear or pale yellow.  Keep the area around your vagina and rectum clean. ? Wash the area daily with warm water. ? Wipe yourself from front to back after using the toilet.  Keep all follow-up visits as told by your health care provider. This is important. How is this prevented?  Do not douche.  Wash the outside of your vagina with warm water only.  Use protection when having sex. This includes latex condoms and dental dams.  Limit how many sexual partners you have. To help prevent bacterial vaginosis, it is best to have sex with just   one partner (monogamous).  Make sure you and your sexual partner are tested for STIs.  Wear cotton or cotton-lined underwear.  Avoid wearing tight pants and pantyhose, especially during summer.  Limit the amount of alcohol that you drink.  Do not use any products that  contain nicotine or tobacco, such as cigarettes and e-cigarettes. If you need help quitting, ask your health care provider.  Do not use illegal drugs. Where to find more information:  Centers for Disease Control and Prevention: www.cdc.gov/std  American Sexual Health Association (ASHA): www.ashastd.org  U.S. Department of Health and Human Services, Office on Women's Health: www.womenshealth.gov/ or https://www.womenshealth.gov/a-z-topics/bacterial-vaginosis Contact a health care provider if:  Your symptoms do not improve, even after treatment.  You have more discharge or pain when urinating.  You have a fever.  You have pain in your abdomen.  You have pain during sex.  You have vaginal bleeding between periods. Summary  Bacterial vaginosis is a vaginal infection that occurs when the normal balance of bacteria in the vagina is disrupted.  Because bacterial vaginosis increases your risk for STIs (sexually transmitted infections), getting treated can help reduce your risk for chlamydia, gonorrhea, herpes, and HIV (human immunodeficiency virus). Treatment is also important for preventing complications in pregnant women, because the condition can cause an early (premature) delivery.  This condition is treated with antibiotic medicines. These may be given as a pill, a vaginal cream, or a medicine that is put into the vagina (suppository). This information is not intended to replace advice given to you by your health care provider. Make sure you discuss any questions you have with your health care provider. Document Released: 03/19/2005 Document Revised: 12/03/2015 Document Reviewed: 12/03/2015 Elsevier Interactive Patient Education  2017 [employer]  

## 2016-07-21 LAB — URINE CULTURE: Culture: >=100000 — AB

## 2016-07-22 LAB — CULTURE, BLOOD (ROUTINE X 2): CULTURE: NO GROWTH

## 2016-07-22 LAB — AEROBIC CULTURE  (SUPERFICIAL SPECIMEN)

## 2016-07-22 LAB — AEROBIC CULTURE W GRAM STAIN (SUPERFICIAL SPECIMEN)

## 2016-07-23 LAB — CULTURE, BLOOD (ROUTINE X 2)
CULTURE: NO GROWTH
SPECIAL REQUESTS: ADEQUATE

## 2016-07-25 ENCOUNTER — Emergency Department (HOSPITAL_COMMUNITY)
Admission: EM | Admit: 2016-07-25 | Discharge: 2016-07-25 | Disposition: A | Discharge status: Home / Self Care | Payer: Medicaid Other | Attending: Emergency Medicine | Admitting: Emergency Medicine

## 2016-07-25 ENCOUNTER — Encounter (HOSPITAL_COMMUNITY): Payer: Self-pay

## 2016-07-25 ENCOUNTER — Emergency Department (HOSPITAL_COMMUNITY): Payer: Medicaid Other

## 2016-07-25 DIAGNOSIS — R0981 Nasal congestion: Secondary | ICD-10-CM | POA: Diagnosis not present

## 2016-07-25 DIAGNOSIS — Z79899 Other long term (current) drug therapy: Secondary | ICD-10-CM | POA: Insufficient documentation

## 2016-07-25 DIAGNOSIS — R110 Nausea: Secondary | ICD-10-CM | POA: Diagnosis not present

## 2016-07-25 DIAGNOSIS — R0602 Shortness of breath: Secondary | ICD-10-CM | POA: Diagnosis not present

## 2016-07-25 DIAGNOSIS — Z91040 Latex allergy status: Secondary | ICD-10-CM | POA: Insufficient documentation

## 2016-07-25 LAB — COMPREHENSIVE METABOLIC PANEL
ALBUMIN: 3.2 g/dL — AB (ref 3.5–5.0)
ALT: 7 U/L — ABNORMAL LOW (ref 14–54)
AST: 11 U/L — AB (ref 15–41)
Alkaline Phosphatase: 92 U/L (ref 38–126)
Anion gap: 8 (ref 5–15)
BILIRUBIN TOTAL: 0.3 mg/dL (ref 0.3–1.2)
BUN: 11 mg/dL (ref 6–20)
CHLORIDE: 107 mmol/L (ref 101–111)
CO2: 24 mmol/L (ref 22–32)
Calcium: 8.4 mg/dL — ABNORMAL LOW (ref 8.9–10.3)
Creatinine, Ser: 0.36 mg/dL — ABNORMAL LOW (ref 0.44–1.00)
GFR calc Af Amer: >60 mL/min (ref >60)
GFR calc non Af Amer: >60 mL/min (ref >60)
Glucose, Bld: 103 mg/dL — ABNORMAL HIGH (ref 65–99)
POTASSIUM: 4.2 mmol/L (ref 3.5–5.1)
Sodium: 139 mmol/L (ref 135–145)
TOTAL PROTEIN: 5.7 g/dL — AB (ref 6.5–8.1)

## 2016-07-25 LAB — CBC WITH DIFFERENTIAL/PLATELET
Basophils Absolute: 0 10*3/uL (ref 0.0–0.1)
Basophils Relative: 0 %
EOS PCT: 1 %
Eosinophils Absolute: 0.1 10*3/uL (ref 0.0–0.7)
HEMATOCRIT: 33.7 % — AB (ref 36.0–46.0)
HEMOGLOBIN: 9.8 g/dL — AB (ref 12.0–15.0)
LYMPHS ABS: 1.3 10*3/uL (ref 0.7–4.0)
LYMPHS PCT: 14 %
MCH: 22.2 pg — AB (ref 26.0–34.0)
MCHC: 29.1 g/dL — AB (ref 30.0–36.0)
MCV: 76.4 fL — ABNORMAL LOW (ref 78.0–100.0)
MONO ABS: 0.4 10*3/uL (ref 0.1–1.0)
MONOS PCT: 4 %
Neutro Abs: 7.2 10*3/uL (ref 1.7–7.7)
Neutrophils Relative %: 81 %
Platelets: 267 10*3/uL (ref 150–400)
RBC: 4.41 MIL/uL (ref 3.87–5.11)
RDW: 17.9 % — ABNORMAL HIGH (ref 11.5–15.5)
WBC: 9 10*3/uL (ref 4.0–10.5)

## 2016-07-25 LAB — LIPASE, BLOOD: Lipase: 24 U/L (ref 11–51)

## 2016-07-25 MED ORDER — ONDANSETRON 4 MG PO TBDP
4.0000 mg | ORAL_TABLET | Freq: Once | ORAL | Status: AC
  Administered 2016-07-25: 4 mg via ORAL
  Filled 2016-07-25: qty 1

## 2016-07-25 NOTE — ED Provider Notes (Signed)
[facility] DEPT Provider Note   CSN: [id number] Arrival date & time: 07/25/16  0605     History   Chief Complaint Chief Complaint  Patient presents with  . Pneumonia    HPI [name] is a 36 y.o. female.  [name] is a 36 y.o. 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 Patient reports this morning she had a brief 5-10 min episode of feeling short of breath. She does report at times this can occur due to her position. Her mother repositioned her and she still felt somewhat short of breath. She reports this has now resolved. She reports some associated nasal congestion, runny nose and postnasal drip. She's been using Zyrtec and Flonase. She denies any chest pain or current shortness of breath. Mother was recently diagnosed with pneumonia, and they were concerned she has pneumonia too. She has had no increased coughing or hemoptysis. No history of PE. She reports good appetite and no recent fevers. She is on flagyl for BV currently. She denies fevers, hemoptysis, increased coughing, chest pain, vomiting, diarrhea or new rashes.    The history is provided by the patient, a parent and medical records. No language interpreter was used.  Pneumonia  Associated symptoms include shortness of breath (resolved ). Pertinent negatives include no chest pain, no abdominal pain and no headaches.    Past Medical History:  Diagnosis Date  . Bladder stones    Hx of  . Depression   . GERD (gastroesophageal reflux disease)   . Multiple sclerosis ([place])   . Paraplegia ([place])   . PTSD (post-traumatic stress disorder)   . Transverse myelitis [facility])     Patient Active Problem List   Diagnosis Date Noted  . Diarrhea 07/17/2016  . BV (bacterial vaginosis) 07/17/2016  . Decubitus ulcer of buttock, stage 4 ([place]) 07/17/2016  . Fecal incontinence 02/23/2016  .  Osteomyelitis of pelvis ([place]) 02/16/2016  . Myositis 02/16/2016  . Infected decubitus ulcer 02/15/2016  . Pressure injury of skin 01/08/2016  . Ileus ([place]) 10/29/2014  . Constipation 10/29/2014  . Obstipation 09/27/2014  . UTI (urinary tract infection) 03/31/2013  . Influenza 03/30/2013  . Nausea vomiting and diarrhea 03/30/2013  . Hypokalemia 02/16/2012  . Multiple sclerosis ([place]) 10/16/2011  . Paraplegia ([place])   . History of transverse myelitis     Past Surgical History:  Procedure Laterality Date  . CHOLECYSTECTOMY    . CYSTECTOMY W/ URETEROILEAL CONDUIT    . TONSILLECTOMY    . WISDOM TOOTH EXTRACTION      OB History    No data available       Home Medications    Prior to Admission medications   Medication Sig Start Date End Date Taking? Authorizing Provider  acetaminophen (TYLENOL) 325 MG tablet Take 650 mg by mouth every 6 (six) hours as needed for fever.   Yes Historical Provider, MD  baclofen (LIORESAL) 20 MG tablet Take 20 mg by mouth 3 (three) times daily.   Yes Historical Provider, MD  diazepam (VALIUM) 5 MG tablet Take 1-2 tablets (5-10 mg total) by mouth every 8 (eight) hours. Take 1 tablet in AM, 1 tablet in PM, and 2 tablets at bedtime Patient taking differently: Take 5-10 mg by mouth 3 (three) times daily. Take 1 tablet in AM, 1 tablet in PM, and 2 tablets at bedtime 04/02/13  Yes [place]  Vivi Ferns, MD  fluticasone [facility]) 50 MCG/ACT nasal spray Place 2 sprays into the nose daily as needed for allergies.    Yes Historical Provider, MD  gabapentin (NEURONTIN) 300 MG capsule Take 2 capsules (600 mg total) by mouth 3 (three) times daily. 01/13/16  Yes Pieter Partridge, DO  lamoTRIgine (LAMICTAL) 25 MG tablet Take 125 mg by mouth daily.   Yes Historical Provider, MD  loratadine (CLARITIN) 5 MG/5ML syrup Take 5 mg by mouth daily.   Yes Historical Provider, MD  lubiprostone (AMITIZA) 24 MCG capsule Take 24 mcg by mouth 2 (two) times daily with a meal.   Yes Historical  Provider, MD  metroNIDAZOLE (FLAGYL) 500 MG tablet Take 1 tablet (500 mg total) by mouth every 12 (twelve) hours. 07/19/16  Yes Maryann Mikhail, DO  omeprazole (PRILOSEC) 40 MG capsule Take 40 mg by mouth daily.   Yes Historical Provider, MD  ondansetron (ZOFRAN ODT) 4 MG disintegrating tablet 4mg  ODT q4 hours prn nausea/vomit Patient taking differently: Take 4 mg by mouth every 4 (four) hours as needed for nausea.  07/19/16  Yes Maryann Mikhail, DO  senna-docusate (SENOKOT-S) 8.6-50 MG per tablet Take 1 tablet by mouth 2 (two) times daily. Patient taking differently: Take 1 tablet by mouth 2 (two) times daily as needed for mild constipation or moderate constipation.  10/31/14  Yes Nishant Dhungel, MD  sertraline (ZOLOFT) 100 MG tablet Take 100 mg by mouth daily.   Yes Historical Provider, MD  bisacodyl (DULCOLAX) 10 MG suppository Place 1 suppository (10 mg total) rectally daily as needed for moderate constipation. Patient not taking: Reported on 07/17/2016 10/31/14   Louellen Molder, MD    Family History Family History  Problem Relation Age of Onset  . Diabetes type II Father   . CAD Father   . Dementia Father   . Stroke Father   . Seizures Father   . Diabetes type II Mother   . Hypertension Mother   . Breast cancer Other   . Diabetes type II Other     Social History Social History  Substance Use Topics  . Smoking status: Never Smoker  . Smokeless tobacco: Never Used  . Alcohol use No     Allergies   Iodine; Latex; Other; Shellfish-derived products; Vancomycin; Aspirin; and Strawberry extract   Review of Systems Review of Systems  Constitutional: Negative for chills and fever.  HENT: Positive for congestion, postnasal drip and rhinorrhea. Negative for sore throat.   Eyes: Negative for visual disturbance.  Respiratory: Positive for shortness of breath (resolved ). Negative for cough and wheezing.   Cardiovascular: Negative for chest pain and palpitations.  Gastrointestinal:  Negative for abdominal pain, diarrhea and vomiting.  Genitourinary: Negative for dysuria.  Musculoskeletal: Negative for arthralgias.  Skin: Negative for rash.  Neurological: Negative for headaches.     Physical Exam Updated Vital Signs BP (S) 132/89 (BP Location: Right Arm) Comment: BP cuff changed to small cuff.   Pulse 95   Temp (S) 99.3 F (37.4 C) (Oral)   Resp 15   Ht 5\' 3"  (1.6 m)   Wt 63.5 kg   SpO2 97%   BMI 24.80 kg/m   Physical Exam  Constitutional: She is oriented to person, place, and time. She appears well-developed and well-nourished. No distress.  HENT:  Head: Normocephalic and atraumatic.  Mouth/Throat: Oropharynx is clear and moist.  Eyes: Conjunctivae are normal. Pupils are equal, round, and reactive to light. Right eye exhibits no discharge. Left eye exhibits  no discharge.  Neck: Neck supple.  Cardiovascular: Normal rate, regular rhythm, normal heart sounds and intact distal pulses.   HR 98  Pulmonary/Chest: Effort normal and breath sounds normal. No respiratory distress. She has no wheezes. She has no rales.  Lungs are clear to ascultation bilaterally. Symmetric chest expansion bilaterally. No increased work of breathing. No rales or rhonchi.    Abdominal: Soft. There is no tenderness. There is no guarding.  Urostomy bag in place.   Musculoskeletal: She exhibits no edema.  Lymphadenopathy:    She has no cervical adenopathy.  Neurological: She is alert and oriented to person, place, and time. Coordination normal.  Skin: Skin is warm and dry. Capillary refill takes less than 2 seconds. No rash noted. She is not diaphoretic. No erythema. No pallor.  Healing decubitus ulcer to left buttocks. No evidence of acute infection. No discharge. No fluctuance or surrounding erythema.   Psychiatric: She has a normal mood and affect. Her behavior is normal.  Nursing note and vitals reviewed.    ED Treatments / Results  Labs (all labs ordered are listed, but only  abnormal results are displayed) Labs Reviewed  COMPREHENSIVE METABOLIC PANEL - Abnormal; Notable for the following:       Result Value   Glucose, Bld 103 (*)    Creatinine, Ser 0.36 (*)    Calcium 8.4 (*)    Total Protein 5.7 (*)    Albumin 3.2 (*)    AST 11 (*)    ALT 7 (*)    All other components within normal limits  CBC WITH DIFFERENTIAL/PLATELET - Abnormal; Notable for the following:    Hemoglobin 9.8 (*)    HCT 33.7 (*)    MCV 76.4 (*)    MCH 22.2 (*)    MCHC 29.1 (*)    RDW 17.9 (*)    All other components within normal limits  LIPASE, BLOOD    EKG  EKG Interpretation  Date/Time:  Wednesday July 25 2016 06:11:02 EDT Ventricular Rate:  101 PR Interval:    QRS Duration: 86 QT Interval:  339 QTC Calculation: 440 R Axis:   42 Text Interpretation:  Sinus tachycardia Low voltage, precordial leads Borderline T abnormalities, anterior leads Confirmed by DELO  MD, Nathaneil Canary ([id number]) on 07/25/2016 6:14:50 AM Also confirmed by Stark Jock  MD, DOUGLAS ([id number]), editor Drema Pry [phone])  on 07/25/2016 7:15:10 AM       Radiology Dg Chest 2 View  Result Date: 07/25/2016 CLINICAL DATA:  36 year old with paraplegia, presenting with acute onset of shortness of breath. EXAM: CHEST  2 VIEW COMPARISON:  02/15/2016, 01/29/2016 and earlier. FINDINGS: AP semi-erect and lateral images were obtained. Cardiomediastinal silhouette unremarkable, unchanged. Lungs clear. Bronchovascular markings normal. Pulmonary vascularity normal. No visible pleural effusions. No pneumothorax. Visualized bony thorax intact. IMPRESSION: No acute cardiopulmonary disease. Electronically Signed   By: Evangeline Dakin M.D.   On: 07/25/2016 07:12    Procedures Procedures (including critical care time)  Medications Ordered in ED Medications  ondansetron (ZOFRAN-ODT) disintegrating tablet 4 mg (4 mg Oral Given 07/25/16 0859)     Initial Impression / Assessment and Plan / ED Course  I have reviewed the triage  vital signs and the nursing notes.  Pertinent labs & imaging results that were available during my care of the patient were reviewed by me and considered in my medical decision making (see chart for details).    This is a 36 y.o. Female with a history of paraplegia due to transverse myelitis  13 years ago who presents to the ED via EMS with her mother requesting to be checked for pneumonia. Patient reports this morning she had a brief 5-10 min episode of feeling short of breath. She does report at times this can occur due to her position. Her mother repositioned her and she still felt somewhat short of breath. She reports this has now resolved. She reports some associated nasal congestion, runny nose and postnasal drip. She's been using Zyrtec and Flonase. She denies any chest pain or current shortness of breath. Mother was recently diagnosed with pneumonia, and they were concerned she has pneumonia too. She has had no increased coughing or hemoptysis.  On exam patient is afebrile nontoxic appearing. Initially patient is slightly tachycardic with a heart rate of 104. This resolved during the patient's emergency department stay. Prior to discharge heart rate is down to 84. She has a healing decubitus ulcer noted to her left buttocks. No evidence of acute infection. Her abdomen is soft and nontender. Lungs clear auscultation bilaterally. I suspect patient's shortness of breath was related to postnasal drip and nasal congestion. Chest x-ray is clear. Patient did complain of some nausea, without vomiting. Blood work was obtained which shows no acute findings. No leukocytosis. At reevaluation patient reports feeling better. She's had no further shortness of breath during the emergency department stay. Heart rate is 85. She is tolerating ginger ale without nausea or vomiting. We'll discharge with close follow-up by her primary care provider. I encouraged her to use Flonase. I advised the patient to follow-up with  their primary care provider this week. I advised the patient to return to the emergency department with new or worsening symptoms or new concerns. The patient and her mother verbalized understanding and agreement with plan.     Final Clinical Impressions(s) / ED Diagnoses   Final diagnoses:  Nausea  Shortness of breath  Nasal congestion    New Prescriptions New Prescriptions   No medications on file     Waynetta Pean, PA-C 07/25/16 Ackerman, Wenda Overland, MD 08/04/16 1536

## 2016-07-25 NOTE — ED Triage Notes (Signed)
Pt comes via [facility], pt called for difficulty breathing which resolved upon EMS arrival, pt states that mother recently diagnosed with pneumonia and she would like to be checked for it also, no fevers. Speaks in full sentences, denies cough, denies CP

## 2016-07-27 ENCOUNTER — Telehealth: Payer: Self-pay | Admitting: Neurology

## 2016-07-27 ENCOUNTER — Other Ambulatory Visit: Payer: Self-pay | Admitting: Neurology

## 2016-07-27 NOTE — Telephone Encounter (Signed)
Advised patient to increase her baclofen to 30mg  three times daily and contact PCP regarding sleep.  Patient agreed.

## 2016-07-27 NOTE — Telephone Encounter (Signed)
Increase baclofen to 30mg  three times daily She should contact her PCP regarding trouble sleeping

## 2016-07-27 NOTE — Telephone Encounter (Signed)
Caller: PT  Urgent? No  Reason for the call: Trouble sleeping and having spasms

## 2016-07-27 NOTE — Telephone Encounter (Signed)
Please advise 

## 2016-08-06 ENCOUNTER — Encounter (HOSPITAL_BASED_OUTPATIENT_CLINIC_OR_DEPARTMENT_OTHER): Payer: Medicaid Other | Attending: Internal Medicine

## 2016-08-06 DIAGNOSIS — L89324 Pressure ulcer of left buttock, stage 4: Secondary | ICD-10-CM | POA: Insufficient documentation

## 2016-08-06 DIAGNOSIS — G35 Multiple sclerosis: Secondary | ICD-10-CM | POA: Insufficient documentation

## 2016-08-06 DIAGNOSIS — G8220 Paraplegia, unspecified: Secondary | ICD-10-CM | POA: Diagnosis not present

## 2016-08-13 ENCOUNTER — Encounter: Payer: Self-pay | Admitting: Neurology

## 2016-08-13 ENCOUNTER — Ambulatory Visit (INDEPENDENT_AMBULATORY_CARE_PROVIDER_SITE_OTHER): Payer: Medicaid Other | Admitting: Neurology

## 2016-08-13 VITALS — BP 100/60 | HR 80 | Ht 63.0 in | Wt 140.0 lb

## 2016-08-13 DIAGNOSIS — G373 Acute transverse myelitis in demyelinating disease of central nervous system: Secondary | ICD-10-CM

## 2016-08-13 MED ORDER — GABAPENTIN 400 MG PO CAPS: 800.0000 mg | ORAL_CAPSULE | Freq: Three times a day (TID) | ORAL | 8 refills | Status: DC

## 2016-08-13 NOTE — Patient Instructions (Signed)
1.  We will increase gabapentin back to 800mg  three times daily 2.  Continue diazepam 5mg  in AM, 5mg  at noon and 10mg  at bedtime 3.  Continue baclofen 30mg  three times daily 4.  Follow up in 9 months.

## 2016-08-13 NOTE — Progress Notes (Signed)
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    UPDATE: In October, she reported daytime somnolence.  To correct this, we tried decreasing diazepam from 10mg  three times daily to 5mg  AM, 5mg  afternoon and 10mg  at bedtime.  We also decreased gabapentin from 800mg  three times daily to 600mg  three times daily.  Due to increase muscle spasms, baclofen was increased from 20mg  three times daily to 30mg  three times daily.  Spasms have not improved.  She was admitted to the hospital for infected decubitus ulcer.  She is currently off of antibiotics.   HISTORY: In 2005, she developed acute transverse myelitis.  She woke up with severe neck and back pain with weakness in the arms and legs, as well as bowel and bladder retention.  MRI of cervical spine showed abnormal signal abnormality and cord swelling from C4 through C7 with abnormal enhancement at the C4-5 level.  MRI of thoracic spine showed upper thoracic cord involvement at TI and T2.  She underwent lumbar puncture and was treated with 3 days of IV Solu-Medrol, followed by oral steroids.     She is paraplegic, unable to move below the waist.  She has both bowel and urinary retention.  She has right greater than left upper extremity weakness.   She has a urostomy pouch and requires catheterizations.  She uses stool softeners for constipation.  She takes Diazepam and Baclofen for muscle spasms, as well as gabapentin.  She has chronic sacral decubitus ulcer for which she is followed by wound care.  She reports daytime somnolence.  She sees psychiatry for depression and PTSD.   A follow up MRI of the neuroaxis with and without contrast was performed on 03/24/15, which was personally reviewed.  The brain showed slight cerebral volume loss but no demyelinating lesions.  The cervical  and thoracic spinal cord showed atrophy extending from C6 to T2, greatest at C7-T1.  There is a left central disc extrusion at C5-6 resulting in cord flattening with new T2 signal at this level but no abnormal enhancement.  PAST MEDICAL HISTORY: Past Medical History:  Diagnosis Date  . Bladder stones    Hx of  . Depression   . GERD (gastroesophageal reflux disease)   . Multiple sclerosis ([place])   . Paraplegia ([place])   . PTSD (post-traumatic stress disorder)   . Transverse myelitis [facility])     MEDICATIONS: Current Outpatient Prescriptions on File Prior to Visit  Medication Sig Dispense Refill  . acetaminophen (TYLENOL) 325 MG tablet Take 650 mg by mouth every 6 (six) hours as needed for fever.    . bisacodyl (DULCOLAX) 10 MG suppository Place 1 suppository (10 mg total) rectally daily as needed for moderate constipation. 12 suppository 0  . diazepam (VALIUM) 5 MG tablet Take 1-2 tablets (5-10 mg total) by mouth every 8 (eight) hours. Take 1 tablet in AM, 1 tablet in PM, and 2 tablets at bedtime (Patient taking differently: Take 5-10 mg by mouth 3 (three) times daily. Take 1 tablet in AM, 1 tablet in PM, and 2 tablets at bedtime) 30 tablet 0  . fluticasone (FLONASE) 50 MCG/ACT nasal spray Place 2 sprays into the nose daily as needed for allergies.     Marland Kitchen lamoTRIgine (LAMICTAL) 25 MG tablet Take 125 mg by mouth daily.    Marland Kitchen loratadine (CLARITIN) 5 MG/5ML syrup  Take 5 mg by mouth daily.    Marland Kitchen lubiprostone (AMITIZA) 24 MCG capsule Take 24 mcg by mouth 2 (two) times daily with a meal.    . omeprazole (PRILOSEC) 40 MG capsule Take 40 mg by mouth daily.    . ondansetron (ZOFRAN ODT) 4 MG disintegrating tablet 4mg  ODT q4 hours prn nausea/vomit (Patient taking differently: Take 4 mg by mouth every 4 (four) hours as needed for nausea. ) 20 tablet 0  . senna-docusate (SENOKOT-S) 8.6-50 MG per tablet Take 1 tablet by mouth 2 (two) times daily. (Patient taking differently: Take 1 tablet by mouth 2 (two) times  daily as needed for mild constipation or moderate constipation. ) 30 tablet 0  . sertraline (ZOLOFT) 100 MG tablet Take 100 mg by mouth daily.     No current facility-administered medications on file prior to visit.     ALLERGIES: Allergies  Allergen Reactions  . Iodine Anaphylaxis  . Latex Anaphylaxis  . Other Anaphylaxis  . Shellfish-Derived Products Anaphylaxis    Reaction unknown  . Vancomycin Anaphylaxis  . Aspirin Other (See Comments)    Reaction unknown  . Strawberry Extract Hives and Rash    FAMILY HISTORY: Family History  Problem Relation Age of Onset  . Diabetes type II Father   . CAD Father   . Dementia Father   . Stroke Father   . Seizures Father   . Diabetes type II Mother   . Hypertension Mother   . Breast cancer Other   . Diabetes type II Other     SOCIAL HISTORY: Social History   Social History  . Marital status: Single    Spouse name: N/A  . Number of children: N/A  . Years of education: N/A   Occupational History  . Not on file.   Social History Main Topics  . Smoking status: Never Smoker  . Smokeless tobacco: Never Used  . Alcohol use No  . Drug use: No  . Sexual activity: No   Other Topics Concern  . Not on file   Social History Narrative   Lives with parents.  All disabled.  Attended [employer] until she got transverse myelitis.    REVIEW OF SYSTEMS: Constitutional: No fevers, chills, or sweats, no generalized fatigue, change in appetite Eyes: No visual changes, double vision, eye pain Ear, nose and throat: No hearing loss, ear pain, nasal congestion, sore throat Cardiovascular: No chest pain, palpitations Respiratory:  No shortness of breath at rest or with exertion, wheezes GastrointestinaI: No nausea, vomiting, diarrhea, abdominal pain, fecal incontinence Genitourinary:  Neurogenic bladder Musculoskeletal:  No neck pain, back pain Integumentary: decubitus ulcer Neurological: as above Psychiatric: No depression, insomnia,  anxiety Endocrine: No palpitations, fatigue, diaphoresis, mood swings, change in appetite, change in weight, increased thirst Hematologic/Lymphatic:  No purpura, petechiae. Allergic/Immunologic: no itchy/runny eyes, nasal congestion, recent allergic reactions, rashes  PHYSICAL EXAM: Vitals:   08/13/16 1336  BP: 100/60  Pulse: 80   General: No acute distress.  Head:  Normocephalic/atraumatic Eyes:  Fundi examined but not visualized Neck: supple, no paraspinal tenderness, full range of motion Heart:  Regular rate and rhythm Lungs:  Clear to auscultation bilaterally Back: No paraspinal tenderness Neurological Exam: alert and oriented to person, place, and time. Attention span and concentration intact, recent and remote memory intact, fund of knowledge intact.  Speech fluent and not dysarthric, language intact.  CN II-XII intact. Decreased muscle bulk in left upper extremity and both lower extremities.  Increased tone in left upper and  both lower extremities. muscle strength 5/5 right deltoid, both biceps, both triceps, both wrist and finger flexors and extensors.  4/5 left deltoid, left hand grip.  0/5 lower extremities..  Sensation to light touch reduced in lower extremities.  Deep tendon reflexes 3+ throughout, slightly more brisk on left, toes downgoing.  Finger to nose testing intact.  Non-ambulatory  IMPRESSION: Transverse myelitis  PLAN: 1.  Due to muscle spasms, will increase gabapentin back to 800mg  three times daily. 2.  Continue diazepam 5mg /5mg /10mg  and baclofen 30mg  three times daily for now. 3.  Continue care for neurogenic bladder and decubitus ulcer 4.  Follow up in 9 months.  21 minutes spent face to face with patient, over 50% spent discussing management.  Metta Clines, DO  CC:  Lennie Odor, PA-C

## 2016-08-30 DIAGNOSIS — L89324 Pressure ulcer of left buttock, stage 4: Secondary | ICD-10-CM | POA: Diagnosis not present

## 2016-09-10 ENCOUNTER — Telehealth: Payer: Self-pay | Admitting: Neurology

## 2016-09-10 NOTE — Telephone Encounter (Signed)
Patient has some questions about a PT that her case work [facility] was talking about please call her at [phone]

## 2016-09-11 NOTE — Telephone Encounter (Signed)
Patient is returning your call she left message on voice mail

## 2016-09-11 NOTE — Telephone Encounter (Signed)
Left message on pt's voice-mail.

## 2016-09-12 NOTE — Telephone Encounter (Signed)
Spoke with patient. She wanted to know if Dr. Tomi Likens had ordered therapy for her. I let her know that no order in the system and no mention of this in notes. It could be another doctor, but doesn't appear to be Dr. Tomi Likens. She will call back with any other questions.

## 2016-10-04 ENCOUNTER — Encounter (HOSPITAL_BASED_OUTPATIENT_CLINIC_OR_DEPARTMENT_OTHER): Payer: Medicaid Other | Attending: Internal Medicine

## 2016-10-04 DIAGNOSIS — L89324 Pressure ulcer of left buttock, stage 4: Secondary | ICD-10-CM | POA: Diagnosis not present

## 2016-10-04 DIAGNOSIS — G35 Multiple sclerosis: Secondary | ICD-10-CM | POA: Diagnosis not present

## 2016-10-17 ENCOUNTER — Encounter (HOSPITAL_COMMUNITY): Payer: Self-pay | Admitting: Emergency Medicine

## 2016-10-17 ENCOUNTER — Emergency Department (HOSPITAL_COMMUNITY)
Admission: EM | Admit: 2016-10-17 | Discharge: 2016-10-17 | Disposition: A | Discharge status: Home / Self Care | Payer: Medicaid Other | Attending: Emergency Medicine | Admitting: Emergency Medicine

## 2016-10-17 DIAGNOSIS — Z79899 Other long term (current) drug therapy: Secondary | ICD-10-CM | POA: Diagnosis not present

## 2016-10-17 DIAGNOSIS — R112 Nausea with vomiting, unspecified: Secondary | ICD-10-CM

## 2016-10-17 DIAGNOSIS — R197 Diarrhea, unspecified: Secondary | ICD-10-CM | POA: Diagnosis not present

## 2016-10-17 DIAGNOSIS — Z91040 Latex allergy status: Secondary | ICD-10-CM | POA: Insufficient documentation

## 2016-10-17 LAB — COMPREHENSIVE METABOLIC PANEL
ALT: 11 U/L — ABNORMAL LOW (ref 14–54)
ANION GAP: 8 (ref 5–15)
AST: 11 U/L — ABNORMAL LOW (ref 15–41)
Albumin: 3.8 g/dL (ref 3.5–5.0)
Alkaline Phosphatase: 94 U/L (ref 38–126)
BUN: 11 mg/dL (ref 6–20)
CO2: 26 mmol/L (ref 22–32)
Calcium: 8.6 mg/dL — ABNORMAL LOW (ref 8.9–10.3)
Chloride: 103 mmol/L (ref 101–111)
Creatinine, Ser: 0.38 mg/dL — ABNORMAL LOW (ref 0.44–1.00)
GFR calc Af Amer: >60 mL/min (ref >60)
GFR calc non Af Amer: >60 mL/min (ref >60)
Glucose, Bld: 89 mg/dL (ref 65–99)
POTASSIUM: 3.5 mmol/L (ref 3.5–5.1)
SODIUM: 137 mmol/L (ref 135–145)
Total Bilirubin: 0.4 mg/dL (ref 0.3–1.2)
Total Protein: 6.7 g/dL (ref 6.5–8.1)

## 2016-10-17 LAB — CBC
HEMATOCRIT: 35.4 % — AB (ref 36.0–46.0)
HEMOGLOBIN: 10.6 g/dL — AB (ref 12.0–15.0)
MCH: 22.2 pg — AB (ref 26.0–34.0)
MCHC: 29.9 g/dL — AB (ref 30.0–36.0)
MCV: 74.2 fL — AB (ref 78.0–100.0)
Platelets: 235 10*3/uL (ref 150–400)
RBC: 4.77 MIL/uL (ref 3.87–5.11)
RDW: 17.6 % — ABNORMAL HIGH (ref 11.5–15.5)
WBC: 6 10*3/uL (ref 4.0–10.5)

## 2016-10-17 LAB — LIPASE, BLOOD: Lipase: 22 U/L (ref 11–51)

## 2016-10-17 MED ORDER — SODIUM CHLORIDE 0.9 % IV BOLUS (SEPSIS)
1000.0000 mL | Freq: Once | INTRAVENOUS | Status: AC
  Administered 2016-10-17: 1000 mL via INTRAVENOUS

## 2016-10-17 MED ORDER — LOPERAMIDE HCL 2 MG PO CAPS
4.0000 mg | ORAL_CAPSULE | Freq: Once | ORAL | Status: AC
  Administered 2016-10-17: 4 mg via ORAL
  Filled 2016-10-17: qty 2

## 2016-10-17 MED ORDER — ONDANSETRON HCL 4 MG/2ML IJ SOLN
4.0000 mg | Freq: Once | INTRAMUSCULAR | Status: AC
  Administered 2016-10-17: 4 mg via INTRAVENOUS
  Filled 2016-10-17: qty 2

## 2016-10-17 NOTE — ED Provider Notes (Signed)
[facility] DEPT Provider Note   CSN: [id number] Arrival date & time: 10/17/16  1017     History   Chief Complaint Chief Complaint  Patient presents with  . Abdominal Pain    HPI [name] is a 36 y.o. female who presents with abdominal pain. 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 She is brought in by her mother who is her caretaker. The patient started have abdominal cramping, nausea, dry heaves, and watery diarrehea this morning. A wound care nurse came to their home at about 9AM this morning and could not get a blood pressure and thought the patient looked pale so they came to the ED. The patient denies fever, cough, SOB. She has cramping abdominal pain which she rates as mild (3/10), nausea with dry heaves, and continued diarrhea. No recent antibiotics or hospitalizations. She also has chronic decreased appetite and her mother is concerned she is dehydrated. She has hypersomnolence as well due to being on multiple sedating medicines which has been discussed with her neurologist.  HPI  Past Medical History:  Diagnosis Date  . Bladder stones    Hx of  . Depression   . GERD (gastroesophageal reflux disease)   . Multiple sclerosis ([place])   . Paraplegia ([place])   . PTSD (post-traumatic stress disorder)   . Transverse myelitis [facility])     Patient Active Problem List   Diagnosis Date Noted  . Diarrhea 07/17/2016  . BV (bacterial vaginosis) 07/17/2016  . Decubitus ulcer of buttock, stage 4 ([place]) 07/17/2016  . Fecal incontinence 02/23/2016  . Osteomyelitis of pelvis ([place]) 02/16/2016  . Myositis 02/16/2016  . Infected decubitus ulcer 02/15/2016  . Pressure injury of skin 01/08/2016  . Ileus ([place]) 10/29/2014  . Constipation 10/29/2014  . Obstipation 09/27/2014  . UTI (urinary tract infection) 03/31/2013  . Influenza 03/30/2013  . Nausea vomiting and diarrhea  03/30/2013  . Hypokalemia 02/16/2012  . Multiple sclerosis ([place]) 10/16/2011  . Paraplegia ([place])   . History of transverse myelitis     Past Surgical History:  Procedure Laterality Date  . CHOLECYSTECTOMY    . CYSTECTOMY W/ URETEROILEAL CONDUIT    . TONSILLECTOMY    . WISDOM TOOTH EXTRACTION      OB History    No data available       Home Medications    Prior to Admission medications   Medication Sig Start Date End Date Taking? Authorizing Provider  acetaminophen (TYLENOL) 325 MG tablet Take 650 mg by mouth every 6 (six) hours as needed for fever.    [provider]  bisacodyl (DULCOLAX) 10 MG suppository Place 1 suppository (10 mg total) rectally daily as needed for moderate constipation. 10/31/14   Dhungel, Nishant, MD  diazepam (VALIUM) 5 MG tablet Take 1-2 tablets (5-10 mg total) by mouth every 8 (eight) hours. Take 1 tablet in AM, 1 tablet in PM, and 2 tablets at bedtime Patient taking differently: Take 5-10 mg by mouth 3 (three) times daily. Take 1 tablet in AM, 1 tablet in PM, and 2 tablets at bedtime 04/02/13   Robbie Lis, MD  fluticasone [facility]) 50 MCG/ACT nasal spray Place 2 sprays into the nose daily as needed for allergies.     [provider]  gabapentin (NEURONTIN) 400 MG capsule Take 2 capsules (800 mg total) by mouth 3 (three) times daily. 08/13/16   Pieter Partridge, DO  lamoTRIgine (LAMICTAL) 25  MG tablet Take 125 mg by mouth daily.    [provider]  loratadine (CLARITIN) 5 MG/5ML syrup Take 5 mg by mouth daily.    [provider]  lubiprostone (AMITIZA) 24 MCG capsule Take 24 mcg by mouth 2 (two) times daily with a meal.    [provider]  omeprazole (PRILOSEC) 40 MG capsule Take 40 mg by mouth daily.    [provider]  ondansetron (ZOFRAN ODT) 4 MG disintegrating tablet 4mg  ODT q4 hours prn nausea/vomit Patient taking differently: Take 4 mg by mouth every 4 (four) hours as needed for nausea.  07/19/16    Mikhail, Velta Addison, DO  senna-docusate (SENOKOT-S) 8.6-50 MG per tablet Take 1 tablet by mouth 2 (two) times daily. Patient taking differently: Take 1 tablet by mouth 2 (two) times daily as needed for mild constipation or moderate constipation.  10/31/14   Dhungel, Flonnie Overman, MD  sertraline (ZOLOFT) 100 MG tablet Take 100 mg by mouth daily.    [provider]    Family History Family History  Problem Relation Age of Onset  . Diabetes type II Father   . CAD Father   . Dementia Father   . Stroke Father   . Seizures Father   . Diabetes type II Mother   . Hypertension Mother   . Breast cancer Other   . Diabetes type II Other     Social History Social History  Substance Use Topics  . Smoking status: Never Smoker  . Smokeless tobacco: Never Used  . Alcohol use No     Allergies   Iodine; Latex; Other; Shellfish-derived products; Vancomycin; Aspirin; and Strawberry extract   Review of Systems Review of Systems  Constitutional: Positive for appetite change (chronic). Negative for chills and fever.  Respiratory: Negative for cough and shortness of breath.   Gastrointestinal: Positive for abdominal pain, diarrhea, nausea and vomiting.  Skin: Positive for wound (chronic).  Psychiatric/Behavioral: Positive for sleep disturbance (hypersomnolence).  All other systems reviewed and are negative.    Physical Exam Updated Vital Signs BP 119/77 (BP Location: Left Arm)   Pulse 85   Temp 97.7 F (36.5 C) (Oral)   Resp 18   SpO2 95%   Physical Exam  Constitutional: She is oriented to person, place, and time. She appears well-developed and well-nourished. No distress.  Chronically ill appearing  HENT:  Head: Normocephalic and atraumatic.  Poor dentition, dry mucous membranes  Eyes: Pupils are equal, round, and reactive to light. Conjunctivae are normal. Right eye exhibits no discharge. Left eye exhibits no discharge. No scleral icterus.  Neck: Normal range of motion.    Cardiovascular: Normal rate and regular rhythm.  Exam reveals no gallop and no friction rub.   No murmur heard. Pulmonary/Chest: Effort normal and breath sounds normal. No respiratory distress. She has no wheezes. She has no rales. She exhibits no tenderness.  Abdominal: Soft. Bowel sounds are normal. She exhibits no distension and no mass. There is no tenderness. There is no rebound and no guarding. No hernia.  Urostomy in place  Neurological: She is alert and oriented to person, place, and time.  Paraplegic from waist down  Skin: Skin is warm and dry.  Decubitus wound over left buttock  Psychiatric: She has a normal mood and affect. Her behavior is normal.  Nursing note and vitals reviewed.    ED Treatments / Results  Labs (all labs ordered are listed, but only abnormal results are displayed) Labs Reviewed  COMPREHENSIVE METABOLIC PANEL -  Abnormal; Notable for the following:       Result Value   Creatinine, Ser 0.38 (*)    Calcium 8.6 (*)    AST 11 (*)    ALT 11 (*)    All other components within normal limits  CBC - Abnormal; Notable for the following:    Hemoglobin 10.6 (*)    HCT 35.4 (*)    MCV 74.2 (*)    MCH 22.2 (*)    MCHC 29.9 (*)    RDW 17.6 (*)    All other components within normal limits  LIPASE, BLOOD    EKG  EKG Interpretation None       Radiology No results found.  Procedures Procedures (including critical care time)  Medications Ordered in ED Medications  sodium chloride 0.9 % bolus 1,000 mL (0 mLs Intravenous Stopped 10/17/16 1932)  ondansetron (ZOFRAN) injection 4 mg (4 mg Intravenous Given 10/17/16 1648)  loperamide (IMODIUM) capsule 4 mg (4 mg Oral Given 10/17/16 1932)     Initial Impression / Assessment and Plan / ED Course  I have reviewed the triage vital signs and the nursing notes.  Pertinent labs & imaging results that were available during my care of the patient were reviewed by me and considered in my medical decision making  (see chart for details).  36 year old female with abdominal cramping, N/V/D likely due to viral illness. BP is soft, otherwise vitals are normal. Labs are overall unremarkable. Fluids and Zofran were started, will reassess.  Pt reports feeling better. Will PO challenge and start Immodium.  PO challenge was tolerated well. Will d/c. Patient has Zofran and Immodium at home. Return precautions were given.  Final Clinical Impressions(s) / ED Diagnoses   Final diagnoses:  Nausea vomiting and diarrhea    New Prescriptions New Prescriptions   No medications on file     Iris Pert 10/18/16 Darryl Lent, MD 10/18/16 [phone]

## 2016-10-17 NOTE — ED Notes (Signed)
Pt tolerating fluids well orally.  Cleaned pt and changed her brief.

## 2016-10-17 NOTE — ED Triage Notes (Signed)
Pt complaint of abdominal pain with associated "feeling hot," nausea, and diarrhea.

## 2016-10-17 NOTE — Discharge Instructions (Signed)
Please drink plenty of fluids Take Zofran for nausea Take Immodium for diarrhea Follow up with your doctor Return for worsening symptoms

## 2016-11-07 ENCOUNTER — Emergency Department (HOSPITAL_COMMUNITY)
Admission: EM | Admit: 2016-11-07 | Discharge: 2016-11-08 | Disposition: A | Discharge status: Home / Self Care | Payer: Medicaid Other | Attending: Emergency Medicine | Admitting: Emergency Medicine

## 2016-11-07 ENCOUNTER — Encounter (HOSPITAL_COMMUNITY): Payer: Self-pay | Admitting: Emergency Medicine

## 2016-11-07 DIAGNOSIS — Z79899 Other long term (current) drug therapy: Secondary | ICD-10-CM | POA: Diagnosis not present

## 2016-11-07 DIAGNOSIS — Z91040 Latex allergy status: Secondary | ICD-10-CM | POA: Insufficient documentation

## 2016-11-07 DIAGNOSIS — N939 Abnormal uterine and vaginal bleeding, unspecified: Secondary | ICD-10-CM | POA: Diagnosis not present

## 2016-11-07 LAB — CBC
HCT: 35.5 % — ABNORMAL LOW (ref 36.0–46.0)
Hemoglobin: 10.7 g/dL — ABNORMAL LOW (ref 12.0–15.0)
MCH: 22.2 pg — ABNORMAL LOW (ref 26.0–34.0)
MCHC: 30.1 g/dL (ref 30.0–36.0)
MCV: 73.5 fL — ABNORMAL LOW (ref 78.0–100.0)
PLATELETS: 256 10*3/uL (ref 150–400)
RBC: 4.83 MIL/uL (ref 3.87–5.11)
RDW: 17.3 % — AB (ref 11.5–15.5)
WBC: 6.2 10*3/uL (ref 4.0–10.5)

## 2016-11-07 LAB — BASIC METABOLIC PANEL
Anion gap: 8 (ref 5–15)
BUN: 11 mg/dL (ref 6–20)
CALCIUM: 9 mg/dL (ref 8.9–10.3)
CO2: 28 mmol/L (ref 22–32)
CREATININE: 0.49 mg/dL (ref 0.44–1.00)
Chloride: 102 mmol/L (ref 101–111)
Glucose, Bld: 93 mg/dL (ref 65–99)
Potassium: 3.4 mmol/L — ABNORMAL LOW (ref 3.5–5.1)
SODIUM: 138 mmol/L (ref 135–145)

## 2016-11-07 LAB — HCG, QUANTITATIVE, PREGNANCY: HCG, BETA CHAIN, QUANT, S: 1 m[IU]/mL (ref <5)

## 2016-11-07 MED ORDER — SODIUM CHLORIDE 0.9 % IV SOLN
1000.0000 mL | INTRAVENOUS | Status: DC
  Administered 2016-11-07: 1000 mL via INTRAVENOUS

## 2016-11-07 MED ORDER — SODIUM CHLORIDE 0.9 % IV BOLUS (SEPSIS)
1000.0000 mL | Freq: Once | INTRAVENOUS | Status: AC
Start: 2016-11-07 — End: 2016-11-07
  Administered 2016-11-07: 1000 mL via INTRAVENOUS

## 2016-11-07 NOTE — Discharge Instructions (Signed)
Stay well-hydrated with water. Follow-up with a gynecologist for further evaluation of your bleeding. Return to the emergency department if you develop fever, chills, vomiting, worsening pain, or any new or worsening symptoms.

## 2016-11-07 NOTE — ED Notes (Signed)
RN AT BEDSIDE COLLECTING LABS AND PLACING IV

## 2016-11-07 NOTE — ED Triage Notes (Signed)
Pt presents from home after continuing vaginal bleeding x 3 weeks and subsequent weakness. Hx of MS. Alert and oriented.

## 2016-11-07 NOTE — ED Provider Notes (Signed)
[facility] DEPT Provider Note   CSN: [id number] Arrival date & time: 11/07/16  1825     History   Chief Complaint Chief Complaint  Patient presents with  . Vaginal Bleeding    HPI [name] is a 36 y.o. female presenting with 3 wk h/o vaginal bleeding.   Pt states that she stared having vaginal bleeding several days after her period ended. She has not had bleeding like this before. She does not use pads as she wears pull ups due to baseline incontinence. She states that most days the bleeding is very heavy. Pt's home health RN stated today that she seemed more pale than usual, and her blood pressure was "a little low" (no number given). Pt has a h/o transverse mylelitis, which has left her paraplegic, and PCOS. She is not sexually active, and states she has never been. She cannot feel pain below her wait, but reports an occasional pressure-like feeling. She denies fevers, chill, CP, SOB, weakness, cough, dizziness, nausea, vomiting, or abnormal BMs. She has several family members that have had uterine, ovarian, and breast cancers in their 36s. She has not seen a gynecologist in over 1 yr. She called to make an apt with a gyn, but the earliest apt was 09/07.   HPI  Past Medical History:  Diagnosis Date  . Bladder stones    Hx of  . Depression   . GERD (gastroesophageal reflux disease)   . Multiple sclerosis ([place])   . Paraplegia ([place])   . PTSD (post-traumatic stress disorder)   . Transverse myelitis [facility])     Patient Active Problem List   Diagnosis Date Noted  . BV (bacterial vaginosis) 07/17/2016  . Decubitus ulcer of buttock, stage 4 ([place]) 07/17/2016  . Fecal incontinence 02/23/2016  . Osteomyelitis of pelvis ([place]) 02/16/2016  . Myositis 02/16/2016  . Infected decubitus ulcer 02/15/2016  . Pressure injury of skin 01/08/2016  . Ileus ([place]) 10/29/2014  . Constipation 10/29/2014  . Obstipation 09/27/2014  . UTI (urinary tract infection) 03/31/2013  . Influenza  03/30/2013  . Nausea vomiting and diarrhea 03/30/2013  . Hypokalemia 02/16/2012  . Multiple sclerosis ([place]) 10/16/2011  . Paraplegia ([place])   . History of transverse myelitis     Past Surgical History:  Procedure Laterality Date  . CHOLECYSTECTOMY    . CYSTECTOMY W/ URETEROILEAL CONDUIT    . TONSILLECTOMY    . WISDOM TOOTH EXTRACTION      OB History    No data available       Home Medications    Prior to Admission medications   Medication Sig Start Date End Date Taking? Authorizing Provider  diazepam (VALIUM) 5 MG tablet Take 1-2 tablets (5-10 mg total) by mouth every 8 (eight) hours. Take 1 tablet in AM, 1 tablet in PM, and 2 tablets at bedtime Patient taking differently: Take 5-10 mg by mouth 3 (three) times daily. Take 1 tablet in AM, 1 tablet in PM, and 2 tablets at bedtime 04/02/13  Yes Robbie Lis, MD  fluticasone [facility]) 50 MCG/ACT nasal spray Place 2 sprays into the nose daily as needed for allergies.    Yes [provider]  gabapentin (NEURONTIN) 400 MG capsule Take 2 capsules (800 mg total) by mouth 3 (three) times daily. 08/13/16  Yes Jaffe, Adam R, DO  ibuprofen (ADVIL,MOTRIN) 200 MG tablet Take 200 mg by mouth every 6 (six) hours as needed for moderate pain.   Yes [provider]  lamoTRIgine (LAMICTAL) 25 MG  tablet Take 125 mg by mouth daily.   Yes [provider]  Loperamide HCl (IMODIUM A-D PO) Take 1 tablet by mouth daily as needed (diarrhea).    Yes [provider]  loratadine (CLARITIN) 5 MG/5ML syrup Take 5 mg by mouth daily.   Yes [provider]  lubiprostone (AMITIZA) 24 MCG capsule Take 24 mcg by mouth 2 (two) times daily with a meal.   Yes [provider]  omeprazole (PRILOSEC) 40 MG capsule Take 40 mg by mouth daily.   Yes [provider]  sertraline (ZOLOFT) 100 MG tablet Take 100 mg by mouth daily.   Yes [provider]  acetaminophen (TYLENOL) 325 MG tablet Take 650 mg by mouth  every 6 (six) hours as needed for fever.    [provider]  bisacodyl (DULCOLAX) 10 MG suppository Place 1 suppository (10 mg total) rectally daily as needed for moderate constipation. Patient not taking: Reported on 10/17/2016 10/31/14   Dhungel, Flonnie Overman, MD  ondansetron (ZOFRAN ODT) 4 MG disintegrating tablet 4mg  ODT q4 hours prn nausea/vomit Patient taking differently: Take 4 mg by mouth every 4 (four) hours as needed for nausea.  07/19/16   Mikhail, Velta Addison, DO  senna-docusate (SENOKOT-S) 8.6-50 MG per tablet Take 1 tablet by mouth 2 (two) times daily. Patient not taking: Reported on 10/17/2016 10/31/14   Louellen Molder, MD    Family History Family History  Problem Relation Age of Onset  . Diabetes type II Father   . CAD Father   . Dementia Father   . Stroke Father   . Seizures Father   . Diabetes type II Mother   . Hypertension Mother   . Breast cancer Other   . Diabetes type II Other     Social History Social History  Substance Use Topics  . Smoking status: Never Smoker  . Smokeless tobacco: Never Used  . Alcohol use No     Allergies   Iodine; Latex; Other; Shellfish-derived products; Vancomycin; Aspirin; and Strawberry extract   Review of Systems Review of Systems  Constitutional: Negative for chills and fever.  HENT: Negative for congestion and sore throat.   Eyes: Negative for visual disturbance.  Respiratory: Negative for cough, chest tightness and shortness of breath.   Cardiovascular: Negative for chest pain and palpitations.  Gastrointestinal: Negative for blood in stool, constipation, diarrhea, nausea and vomiting.  Genitourinary: Positive for vaginal bleeding. Negative for hematuria.  Musculoskeletal: Negative for neck pain.  Skin: Negative for rash.  Neurological: Negative for dizziness and light-headedness.  Hematological: Does not bruise/bleed easily.  Psychiatric/Behavioral: Negative for agitation and confusion.     Physical Exam Updated  Vital Signs BP 109/68 (BP Location: Right Arm)   Pulse 88   Temp 98.6 F (37 C) (Oral)   Resp 18   SpO2 98%   Physical Exam  Constitutional: She is oriented to person, place, and time. She appears well-developed and well-nourished. No distress.  Chronically ill appearing  HENT:  Head: Normocephalic and atraumatic.  Nose: Nose normal.  Mouth/Throat: Uvula is midline and oropharynx is clear and moist. Mucous membranes are dry.  Eyes: Pupils are equal, round, and reactive to light. Conjunctivae and EOM are normal.  Neck: Normal range of motion. Neck supple.  Cardiovascular: Normal rate, regular rhythm and intact distal pulses.   Pulmonary/Chest: Effort normal and breath sounds normal. No respiratory distress. She has no wheezes.  Abdominal: Soft. Bowel sounds are normal. She exhibits no distension and no mass. There is no  tenderness.  Genitourinary: Rectum normal and vagina normal. Pelvic exam was performed with patient supine. There is no rash, tenderness or lesion on the right labia. There is no rash, tenderness or lesion on the left labia. Cervix exhibits no discharge. Right adnexum displays no mass and no tenderness. Left adnexum displays no mass and no tenderness.  Genitourinary Comments: Bleeding from cervical os without large clots. No discharge noted. Uterus firm to palpation, without large or obvious masses felt.   Musculoskeletal: Normal range of motion.  Lymphadenopathy:    She has no cervical adenopathy.  Neurological: She is alert and oriented to person, place, and time.  Skin: Skin is warm and dry. Capillary refill takes less than 2 seconds. There is pallor.  Pt has generalized pallor of the skin, without pallor of mucous membranes. Good cap refill  Psychiatric: She has a normal mood and affect.  Nursing note and vitals reviewed.    ED Treatments / Results  Labs (all labs ordered are listed, but only abnormal results are displayed) Labs Reviewed  CBC - Abnormal;  Notable for the following:       Result Value   Hemoglobin 10.7 (*)    HCT 35.5 (*)    MCV 73.5 (*)    MCH 22.2 (*)    RDW 17.3 (*)    All other components within normal limits  BASIC METABOLIC PANEL - Abnormal; Notable for the following:    Potassium 3.4 (*)    All other components within normal limits  HCG, QUANTITATIVE, PREGNANCY    EKG  EKG Interpretation None       Radiology No results found.  Procedures Procedures (including critical care time)  Medications Ordered in ED Medications  sodium chloride 0.9 % bolus 1,000 mL (0 mLs Intravenous Stopped 11/07/16 2210)    Followed by  0.9 %  sodium chloride infusion (0 mLs Intravenous Stopped 11/07/16 2350)     Initial Impression / Assessment and Plan / ED Course  I have reviewed the triage vital signs and the nursing notes.  Pertinent labs & imaging results that were available during my care of the patient were reviewed by me and considered in my medical decision making (see chart for details).     Pt presenting with 3 wks vaginal bleeding, and concern for anemia. Physical exam shows chronically ill appearing F with generalized pallor. Pelvic shows blood from cervix without obvious masses or lesions Will order CBC and BMP and start IVF.   CBC shows stable anemia. Hgb and Hct baseline for patient. BMP reassuring. Discussed cased with attending, and Dr. Tomi Bamberger agrees to plan. Discussed findings with pt and mother. Discussed that she needs further testing and evaluation by gyn, especially considering fam hx. Pt to continue to stay well hydrated. Return precautions given. Pt and mom state they understand and agree to plan.   Final Clinical Impressions(s) / ED Diagnoses   Final diagnoses:  Vaginal bleeding    New Prescriptions Discharge Medication List as of 11/07/2016 10:46 PM       Franchot Heidelberg, PA-C 11/08/16 0057    Dorie Rank, MD 11/08/16 1429

## 2016-11-09 ENCOUNTER — Inpatient Hospital Stay (HOSPITAL_COMMUNITY): Payer: Medicaid Other

## 2016-11-09 ENCOUNTER — Inpatient Hospital Stay (HOSPITAL_COMMUNITY)
Admission: AD | Admit: 2016-11-09 | Discharge: 2016-11-09 | Disposition: A | Discharge status: Home / Self Care | Payer: Medicaid Other | Source: Ambulatory Visit | Attending: Family Medicine | Admitting: Family Medicine

## 2016-11-09 ENCOUNTER — Encounter (HOSPITAL_COMMUNITY): Payer: Self-pay | Admitting: *Deleted

## 2016-11-09 DIAGNOSIS — F4310 Post-traumatic stress disorder, unspecified: Secondary | ICD-10-CM | POA: Diagnosis not present

## 2016-11-09 DIAGNOSIS — Z91013 Allergy to seafood: Secondary | ICD-10-CM | POA: Diagnosis not present

## 2016-11-09 DIAGNOSIS — Z91040 Latex allergy status: Secondary | ICD-10-CM | POA: Insufficient documentation

## 2016-11-09 DIAGNOSIS — Z881 Allergy status to other antibiotic agents status: Secondary | ICD-10-CM | POA: Diagnosis not present

## 2016-11-09 DIAGNOSIS — Z9049 Acquired absence of other specified parts of digestive tract: Secondary | ICD-10-CM | POA: Diagnosis not present

## 2016-11-09 DIAGNOSIS — Z803 Family history of malignant neoplasm of breast: Secondary | ICD-10-CM | POA: Diagnosis not present

## 2016-11-09 DIAGNOSIS — Z820 Family history of epilepsy and other diseases of the nervous system: Secondary | ICD-10-CM | POA: Diagnosis not present

## 2016-11-09 DIAGNOSIS — R509 Fever, unspecified: Secondary | ICD-10-CM | POA: Insufficient documentation

## 2016-11-09 DIAGNOSIS — F329 Major depressive disorder, single episode, unspecified: Secondary | ICD-10-CM | POA: Insufficient documentation

## 2016-11-09 DIAGNOSIS — Z8249 Family history of ischemic heart disease and other diseases of the circulatory system: Secondary | ICD-10-CM | POA: Diagnosis not present

## 2016-11-09 DIAGNOSIS — G35 Multiple sclerosis: Secondary | ICD-10-CM | POA: Diagnosis not present

## 2016-11-09 DIAGNOSIS — N938 Other specified abnormal uterine and vaginal bleeding: Secondary | ICD-10-CM | POA: Diagnosis not present

## 2016-11-09 DIAGNOSIS — N939 Abnormal uterine and vaginal bleeding, unspecified: Secondary | ICD-10-CM | POA: Insufficient documentation

## 2016-11-09 DIAGNOSIS — Z9889 Other specified postprocedural states: Secondary | ICD-10-CM | POA: Diagnosis not present

## 2016-11-09 DIAGNOSIS — Z833 Family history of diabetes mellitus: Secondary | ICD-10-CM | POA: Insufficient documentation

## 2016-11-09 DIAGNOSIS — Z888 Allergy status to other drugs, medicaments and biological substances status: Secondary | ICD-10-CM | POA: Diagnosis not present

## 2016-11-09 DIAGNOSIS — G8220 Paraplegia, unspecified: Secondary | ICD-10-CM | POA: Insufficient documentation

## 2016-11-09 DIAGNOSIS — Z823 Family history of stroke: Secondary | ICD-10-CM | POA: Diagnosis not present

## 2016-11-09 DIAGNOSIS — Z886 Allergy status to analgesic agent status: Secondary | ICD-10-CM | POA: Insufficient documentation

## 2016-11-09 DIAGNOSIS — K219 Gastro-esophageal reflux disease without esophagitis: Secondary | ICD-10-CM | POA: Diagnosis not present

## 2016-11-09 DIAGNOSIS — Z87442 Personal history of urinary calculi: Secondary | ICD-10-CM | POA: Diagnosis not present

## 2016-11-09 DIAGNOSIS — Z91018 Allergy to other foods: Secondary | ICD-10-CM | POA: Insufficient documentation

## 2016-11-09 LAB — CBC
HEMATOCRIT: 32.3 % — AB (ref 36.0–46.0)
Hemoglobin: 9.7 g/dL — ABNORMAL LOW (ref 12.0–15.0)
MCH: 22 pg — ABNORMAL LOW (ref 26.0–34.0)
MCHC: 30 g/dL (ref 30.0–36.0)
MCV: 73.4 fL — AB (ref 78.0–100.0)
PLATELETS: 203 10*3/uL (ref 150–400)
RBC: 4.4 MIL/uL (ref 3.87–5.11)
RDW: 17.5 % — AB (ref 11.5–15.5)
WBC: 8.8 10*3/uL (ref 4.0–10.5)

## 2016-11-09 LAB — COMPREHENSIVE METABOLIC PANEL
ALT: 13 U/L — ABNORMAL LOW (ref 14–54)
ANION GAP: 10 (ref 5–15)
AST: 18 U/L (ref 15–41)
Albumin: 3.7 g/dL (ref 3.5–5.0)
Alkaline Phosphatase: 98 U/L (ref 38–126)
BUN: 7 mg/dL (ref 6–20)
CHLORIDE: 103 mmol/L (ref 101–111)
CO2: 22 mmol/L (ref 22–32)
Calcium: 8.4 mg/dL — ABNORMAL LOW (ref 8.9–10.3)
Creatinine, Ser: 0.36 mg/dL — ABNORMAL LOW (ref 0.44–1.00)
Glucose, Bld: 104 mg/dL — ABNORMAL HIGH (ref 65–99)
POTASSIUM: 3.6 mmol/L (ref 3.5–5.1)
SODIUM: 135 mmol/L (ref 135–145)
Total Bilirubin: 0.8 mg/dL (ref 0.3–1.2)
Total Protein: 6.9 g/dL (ref 6.5–8.1)

## 2016-11-09 LAB — LACTIC ACID, PLASMA: LACTIC ACID, VENOUS: 1.3 mmol/L (ref 0.5–1.9)

## 2016-11-09 MED ORDER — LACTATED RINGERS IV BOLUS (SEPSIS)
1000.0000 mL | Freq: Once | INTRAVENOUS | Status: AC
  Administered 2016-11-09: 1000 mL via INTRAVENOUS

## 2016-11-09 MED ORDER — MEGESTROL ACETATE 40 MG PO TABS: 40.0000 mg | ORAL_TABLET | Freq: Two times a day (BID) | ORAL | 3 refills | Status: DC

## 2016-11-09 MED ORDER — ACETAMINOPHEN 500 MG PO TABS
1000.0000 mg | ORAL_TABLET | Freq: Once | ORAL | Status: AC
  Administered 2016-11-09: 1000 mg via ORAL
  Filled 2016-11-09: qty 2

## 2016-11-09 NOTE — MAU Note (Signed)
For the last 3 wks, has been passing blood through female parts.  Much heavier than usual, with clots. started as period.  Went to [employer], could not find reason.  Had a fever of 100.9, pulse of 112.  Has been nauseous and dizzy, with chills.   Home nurse seeing her for a wound on her bottom,  Nurse says it does not look infected.

## 2016-11-09 NOTE — MAU Note (Signed)
Assisted pt to her Wheelchair. Pt stated that she felt a little dizzy b/p decreased to 68/50. Gave pt cool washcloth had pt take some slow deep breaths. B/p came back up to 119/72 1 min after ands she felt better. Observed pt for 15 more min and she was [employer] and felt better and went home with mother.

## 2016-11-09 NOTE — Discharge Instructions (Signed)
Dysfunctional Uterine Bleeding °Dysfunctional uterine bleeding is abnormal bleeding from the uterus. Dysfunctional uterine bleeding includes: °· A period that comes earlier or later than usual. °· A period that is lighter, heavier, or has blood clots. °· Bleeding between periods. °· Skipping one or more periods. °· Bleeding after sexual intercourse. °· Bleeding after menopause. ° °Follow these instructions at home: °Pay attention to any changes in your symptoms. Follow these instructions to help with your condition: °Eating and drinking °· Eat well-balanced meals. Include foods that are high in iron, such as liver, meat, shellfish, green leafy vegetables, and eggs. °· If you become constipated: °? Drink plenty of water. °? Eat fruits and vegetables that are high in water and fiber, such as spinach, carrots, raspberries, apples, and mango. °Medicines °· Take over-the-counter and prescription medicines only as told by your health care provider. °· Do not change medicines without talking with your health care provider. °· Aspirin or medicines that contain aspirin may make the bleeding worse. Do not take those medicines: °? During the week before your period. °? During your period. °· If you were prescribed iron pills, take them as told by your health care provider. Iron pills help to replace iron that your body loses because of this condition. °Activity °· If you need to change your sanitary pad or tampon more than one time every 2 hours: °? Lie in bed with your feet raised (elevated). °? Place a cold pack on your lower abdomen. °? Rest as much as possible until the bleeding stops or slows down. °· Do not try to lose weight until the bleeding has stopped and your blood iron level is back to normal. °Other Instructions °· For two months, write down: °? When your period starts. °? When your period ends. °? When any abnormal bleeding occurs. °? What problems you notice. °· Keep all follow up visits as told by your health  care provider. This is important. °Contact a health care provider if: °· You get light-headed or weak. °· You have nausea and vomiting. °· You cannot eat or drink without vomiting. °· You feel dizzy or have diarrhea while you are taking medicines. °· You are taking birth control pills or hormones, and you want to change them or stop taking them. °Get help right away if: °· You develop a fever or chills. °· You need to change your sanitary pad or tampon more than one time per hour. °· Your bleeding becomes heavier, or your flow contains clots more often. °· You develop pain in your abdomen. °· You lose consciousness. °· You develop a rash. °This information is not intended to replace advice given to you by your health care provider. Make sure you discuss any questions you have with your health care provider. °Document Released: 03/16/2000 Document Revised: 08/25/2015 Document Reviewed: 06/14/2014 °Elsevier Interactive Patient Education © 2018 [employer] ° °

## 2016-11-09 NOTE — MAU Provider Note (Signed)
History     CSN: [id number]  Arrival date and time: 11/09/16 1623   First Provider Initiated Contact with Patient 11/09/16 1753      Chief Complaint  Patient presents with  . Vaginal Bleeding  . Fever   [name] is a 36 y.o. G0P0000 who presents today with vaginal bleeding. She was seen for this at [facility] on 11/07/16. She has an appointment with GYN on 12/07/16, but she is still bleeding. She also developed a fever today. Her home health nurse saw her today, and she has a bedsore that did not appear infected at that time.    Vaginal Bleeding  The patient's primary symptoms include pelvic pain and vaginal bleeding. This is a new problem. Episode onset: 3 weeks ago. The problem occurs constantly. The problem has been unchanged. Pain severity now: some pelvic pressure. Patient cannot feel pain in the lower part of her body, but she is feeling some pressure.  Associated symptoms include chills, a fever, nausea and vomiting. Pertinent negatives include no diarrhea. The vaginal discharge was bloody. The vaginal bleeding is typical of menses. She has been passing clots (about the size of a nickel. ). Nothing aggravates the symptoms. She has tried nothing for the symptoms. Her menstrual history has been regular.  Fever   This is a new problem. The current episode started today. The problem occurs constantly. The problem has been unchanged. The maximum temperature noted was 102 to 102.9 F. The temperature was taken using an oral thermometer. Associated symptoms include nausea and vomiting. Pertinent negatives include no diarrhea. She has tried nothing for the symptoms.   Past Medical History:  Diagnosis Date  . Bladder stones    Hx of  . Depression   . GERD (gastroesophageal reflux disease)   . Multiple sclerosis ([place])   . Paraplegia ([place])   . PTSD (post-traumatic stress disorder)   . Transverse myelitis [facility])     Past Surgical History:  Procedure Laterality Date  . CHOLECYSTECTOMY     . CYSTECTOMY W/ URETEROILEAL CONDUIT    . TONSILLECTOMY    . WISDOM TOOTH EXTRACTION      Family History  Problem Relation Age of Onset  . Diabetes type II Father   . CAD Father   . Dementia Father   . Stroke Father   . Seizures Father   . Diabetes type II Mother   . Hypertension Mother   . Breast cancer Other   . Diabetes type II Other     Social History  Substance Use Topics  . Smoking status: Never Smoker  . Smokeless tobacco: Never Used  . Alcohol use No    Allergies:  Allergies  Allergen Reactions  . Iodine Anaphylaxis  . Latex Anaphylaxis  . Other Anaphylaxis  . Shellfish-Derived Products Anaphylaxis    Reaction unknown  . Vancomycin Anaphylaxis  . Aspirin Other (See Comments)    Reaction unknown  . Strawberry Extract Hives and Rash    Prescriptions Prior to Admission  Medication Sig Dispense Refill Last Dose  . acetaminophen (TYLENOL) 325 MG tablet Take 650 mg by mouth every 6 (six) hours as needed for fever.   unknown  . bisacodyl (DULCOLAX) 10 MG suppository Place 1 suppository (10 mg total) rectally daily as needed for moderate constipation. (Patient not taking: Reported on 10/17/2016) 12 suppository 0 Not Taking at Unknown time  . diazepam (VALIUM) 5 MG tablet Take 1-2 tablets (5-10 mg total) by mouth every 8 (eight) hours. Take 1  tablet in AM, 1 tablet in PM, and 2 tablets at bedtime (Patient taking differently: Take 5-10 mg by mouth 3 (three) times daily. Take 1 tablet in AM, 1 tablet in PM, and 2 tablets at bedtime) 30 tablet 0 11/07/2016 at Unknown time  . fluticasone (FLONASE) 50 MCG/ACT nasal spray Place 2 sprays into the nose daily as needed for allergies.    Past Week at Unknown time  . gabapentin (NEURONTIN) 400 MG capsule Take 2 capsules (800 mg total) by mouth 3 (three) times daily. 180 capsule 8 11/07/2016 at Unknown time  . ibuprofen (ADVIL,MOTRIN) 200 MG tablet Take 200 mg by mouth every 6 (six) hours as needed for moderate pain.   11/06/2016 at  Unknown time  . lamoTRIgine (LAMICTAL) 25 MG tablet Take 125 mg by mouth daily.   11/07/2016 at 0900  . Loperamide HCl (IMODIUM A-D PO) Take 1 tablet by mouth daily as needed (diarrhea).    Past Month at Unknown time  . loratadine (CLARITIN) 5 MG/5ML syrup Take 5 mg by mouth daily.   11/07/2016 at Unknown time  . lubiprostone (AMITIZA) 24 MCG capsule Take 24 mcg by mouth 2 (two) times daily with a meal.   11/07/2016 at Unknown time  . omeprazole (PRILOSEC) 40 MG capsule Take 40 mg by mouth daily.   11/07/2016 at Unknown time  . ondansetron (ZOFRAN ODT) 4 MG disintegrating tablet 4mg  ODT q4 hours prn nausea/vomit (Patient taking differently: Take 4 mg by mouth every 4 (four) hours as needed for nausea. ) 20 tablet 0 unknown  . senna-docusate (SENOKOT-S) 8.6-50 MG per tablet Take 1 tablet by mouth 2 (two) times daily. (Patient not taking: Reported on 10/17/2016) 30 tablet 0 Not Taking at Unknown time  . sertraline (ZOLOFT) 100 MG tablet Take 100 mg by mouth daily.   11/07/2016 at Unknown time    Review of Systems  Constitutional: Positive for chills and fever.  Gastrointestinal: Positive for nausea and vomiting. Negative for diarrhea.  Genitourinary: Positive for pelvic pain and vaginal bleeding.   Physical Exam   Blood pressure (!) 108/92, pulse (!) 127, temperature (!) 102 F (38.9 C), temperature source Oral, resp. rate 18, last menstrual period 10/11/2016, SpO2 98 %.  Physical Exam  Nursing note and vitals reviewed. Constitutional: She is oriented to person, place, and time. She appears well-developed and well-nourished. No distress.  HENT:  Head: Normocephalic.  Cardiovascular: Normal rate.   Respiratory: Effort normal.  GI: Soft. There is no tenderness. There is no rebound.  Neurological: She is alert and oriented to person, place, and time.  Skin: Skin is warm and dry.  Psychiatric: She has a normal mood and affect.   Results for orders placed or performed during the hospital encounter of  11/09/16 (from the past 24 hour(s))  Comprehensive metabolic panel     Status: Abnormal   Collection Time: 11/09/16  6:05 PM  Result Value Ref Range   Sodium 135 135 - 145 mmol/L   Potassium 3.6 3.5 - 5.1 mmol/L   Chloride 103 101 - 111 mmol/L   CO2 22 22 - 32 mmol/L   Glucose, Bld 104 (H) 65 - 99 mg/dL   BUN 7 6 - 20 mg/dL   Creatinine, Ser 0.36 (L) 0.44 - 1.00 mg/dL   Calcium 8.4 (L) 8.9 - 10.3 mg/dL   Total Protein 6.9 6.5 - 8.1 g/dL   Albumin 3.7 3.5 - 5.0 g/dL   AST 18 15 - 41 U/L   ALT 13 (  L) 14 - 54 U/L   Alkaline Phosphatase 98 38 - 126 U/L   Total Bilirubin 0.8 0.3 - 1.2 mg/dL   GFR calc non Af Amer >60 >60 mL/min   GFR calc Af Amer >60 >60 mL/min   Anion gap 10 5 - 15  CBC     Status: Abnormal   Collection Time: 11/09/16  6:30 PM  Result Value Ref Range   WBC 8.8 4.0 - 10.5 K/uL   RBC 4.40 3.87 - 5.11 MIL/uL   Hemoglobin 9.7 (L) 12.0 - 15.0 g/dL   HCT 32.3 (L) 36.0 - 46.0 %   MCV 73.4 (L) 78.0 - 100.0 fL   MCH 22.0 (L) 26.0 - 34.0 pg   MCHC 30.0 30.0 - 36.0 g/dL   RDW 17.5 (H) 11.5 - 15.5 %   Platelets 203 150 - 400 K/uL  Lactic acid, plasma     Status: None   Collection Time: 11/09/16  6:33 PM  Result Value Ref Range   Lactic Acid, Venous 1.3 0.5 - 1.9 mmol/L    MAU Course  Procedures  MDM 2000 Care turned over to M. Jimmye Norman, CNM [place] pending Marcille Buffy 7:59 PM 11/09/16   Assessment and Plan   Report received US Pelvis Complete  Result Date: 11/09/2016 CLINICAL DATA:  Uterine bleeding for 3 weeks. Fever and nausea. LMP: 10/11/2016 EXAM: TRANSABDOMINAL ULTRASOUND OF PELVIS TECHNIQUE: Transabdominal ultrasound examination of the pelvis was performed including evaluation of the uterus, ovaries, adnexal regions, and pelvic cul-de-sac. COMPARISON:  Pelvic MRI 02/15/2016 Pelvic ultrasound 11/21/2011 FINDINGS: Uterus Measurements: 7.1 x 4.3 x 4.2 cm. 7 mm cyst within the uterine wall. Otherwise normal. Endometrium Thickness: 13.4 mm. There is a echogenic  focus within the endometrial cavity measuring 1.6 x 0.7 x 0.9 cm without demonstrable vascular flow. Right ovary Measurements: 3.1 x 1.9 x 2.0 cm. Normal appearance/no adnexal mass. Left ovary Measurements: 4.4 x 2.4 x 3.1 cm. There is a cyst measuring 2.9 x 1.9 x 2.3 cm. Other findings:  No abnormal free fluid. IMPRESSION: 1. Echogenic material in the endometrial cavity without demonstrable vascular flow. This may simply be blood within the endometrial cavity, but a hypovascular mass would be difficult to exclude based on this study alone. 2. Otherwise unremarkable pelvic ultrasound. Electronically Signed   By: Ulyses Jarred M.D.   On: 11/09/2016 20:08   Discussed with Dr Elonda Husky He recommends starting her on Megace for now, taper dose She has new GYN appt Monday with Dr Nelda Marseille Will treat her as viral illness for now, given normal WBC I cautioned her to call her primary doctor if still has fever on Sunday, as viral illness would likely pass by then Tylenol or ibuprofen for fever Followup inoffice Monday  [name], CNM

## 2016-11-14 ENCOUNTER — Emergency Department (HOSPITAL_COMMUNITY): Payer: Medicaid Other

## 2016-11-14 ENCOUNTER — Encounter (HOSPITAL_COMMUNITY): Payer: Self-pay | Admitting: Emergency Medicine

## 2016-11-14 ENCOUNTER — Inpatient Hospital Stay (HOSPITAL_COMMUNITY)
Admission: EM | Admit: 2016-11-14 | Discharge: 2016-11-17 | DRG: 871 | Disposition: A | Discharge status: Home Health | Payer: Medicaid Other | Attending: Internal Medicine | Admitting: Internal Medicine

## 2016-11-14 DIAGNOSIS — Z91013 Allergy to seafood: Secondary | ICD-10-CM

## 2016-11-14 DIAGNOSIS — F329 Major depressive disorder, single episode, unspecified: Secondary | ICD-10-CM | POA: Diagnosis present

## 2016-11-14 DIAGNOSIS — A419 Sepsis, unspecified organism: Secondary | ICD-10-CM | POA: Diagnosis present

## 2016-11-14 DIAGNOSIS — G373 Acute transverse myelitis in demyelinating disease of central nervous system: Secondary | ICD-10-CM | POA: Diagnosis present

## 2016-11-14 DIAGNOSIS — R4182 Altered mental status, unspecified: Secondary | ICD-10-CM | POA: Diagnosis not present

## 2016-11-14 DIAGNOSIS — N390 Urinary tract infection, site not specified: Secondary | ICD-10-CM | POA: Diagnosis present

## 2016-11-14 DIAGNOSIS — Z79899 Other long term (current) drug therapy: Secondary | ICD-10-CM

## 2016-11-14 DIAGNOSIS — Z91041 Radiographic dye allergy status: Secondary | ICD-10-CM | POA: Diagnosis not present

## 2016-11-14 DIAGNOSIS — Z936 Other artificial openings of urinary tract status: Secondary | ICD-10-CM

## 2016-11-14 DIAGNOSIS — K529 Noninfective gastroenteritis and colitis, unspecified: Secondary | ICD-10-CM | POA: Diagnosis present

## 2016-11-14 DIAGNOSIS — G959 Disease of spinal cord, unspecified: Secondary | ICD-10-CM | POA: Diagnosis present

## 2016-11-14 DIAGNOSIS — G35 Multiple sclerosis: Secondary | ICD-10-CM | POA: Diagnosis present

## 2016-11-14 DIAGNOSIS — Z91018 Allergy to other foods: Secondary | ICD-10-CM | POA: Diagnosis not present

## 2016-11-14 DIAGNOSIS — R339 Retention of urine, unspecified: Secondary | ICD-10-CM | POA: Diagnosis present

## 2016-11-14 DIAGNOSIS — L89304 Pressure ulcer of unspecified buttock, stage 4: Secondary | ICD-10-CM | POA: Diagnosis present

## 2016-11-14 DIAGNOSIS — E860 Dehydration: Secondary | ICD-10-CM | POA: Diagnosis present

## 2016-11-14 DIAGNOSIS — L89154 Pressure ulcer of sacral region, stage 4: Secondary | ICD-10-CM | POA: Diagnosis present

## 2016-11-14 DIAGNOSIS — E876 Hypokalemia: Secondary | ICD-10-CM | POA: Diagnosis present

## 2016-11-14 DIAGNOSIS — Z91040 Latex allergy status: Secondary | ICD-10-CM

## 2016-11-14 DIAGNOSIS — M62838 Other muscle spasm: Secondary | ICD-10-CM | POA: Diagnosis present

## 2016-11-14 DIAGNOSIS — Z7401 Bed confinement status: Secondary | ICD-10-CM | POA: Diagnosis not present

## 2016-11-14 DIAGNOSIS — R6520 Severe sepsis without septic shock: Secondary | ICD-10-CM | POA: Diagnosis present

## 2016-11-14 DIAGNOSIS — D509 Iron deficiency anemia, unspecified: Secondary | ICD-10-CM | POA: Diagnosis present

## 2016-11-14 DIAGNOSIS — L89323 Pressure ulcer of left buttock, stage 3: Secondary | ICD-10-CM | POA: Diagnosis present

## 2016-11-14 DIAGNOSIS — R404 Transient alteration of awareness: Secondary | ICD-10-CM | POA: Diagnosis present

## 2016-11-14 DIAGNOSIS — L89324 Pressure ulcer of left buttock, stage 4: Secondary | ICD-10-CM

## 2016-11-14 DIAGNOSIS — R1084 Generalized abdominal pain: Secondary | ICD-10-CM | POA: Diagnosis not present

## 2016-11-14 DIAGNOSIS — F4310 Post-traumatic stress disorder, unspecified: Secondary | ICD-10-CM | POA: Diagnosis present

## 2016-11-14 DIAGNOSIS — Z881 Allergy status to other antibiotic agents status: Secondary | ICD-10-CM

## 2016-11-14 DIAGNOSIS — G8220 Paraplegia, unspecified: Secondary | ICD-10-CM | POA: Diagnosis present

## 2016-11-14 DIAGNOSIS — K219 Gastro-esophageal reflux disease without esophagitis: Secondary | ICD-10-CM | POA: Diagnosis present

## 2016-11-14 LAB — URINALYSIS, ROUTINE W REFLEX MICROSCOPIC
Bilirubin Urine: NEGATIVE
Glucose, UA: NEGATIVE mg/dL
Ketones, ur: 5 mg/dL — AB
Nitrite: POSITIVE — AB
Protein, ur: 30 mg/dL — AB
Specific Gravity, Urine: 1.015 (ref 1.005–1.030)
Squamous Epithelial / LPF: NONE SEEN
pH: 6 (ref 5.0–8.0)

## 2016-11-14 LAB — COMPREHENSIVE METABOLIC PANEL
ALBUMIN: 3.3 g/dL — AB (ref 3.5–5.0)
ALT: 9 U/L — AB (ref 14–54)
AST: 10 U/L — AB (ref 15–41)
Alkaline Phosphatase: 87 U/L (ref 38–126)
Anion gap: 9 (ref 5–15)
BUN: 6 mg/dL (ref 6–20)
CHLORIDE: 100 mmol/L — AB (ref 101–111)
CO2: 28 mmol/L (ref 22–32)
CREATININE: 0.35 mg/dL — AB (ref 0.44–1.00)
Calcium: 8.3 mg/dL — ABNORMAL LOW (ref 8.9–10.3)
GFR calc Af Amer: >60 mL/min (ref >60)
Glucose, Bld: 121 mg/dL — ABNORMAL HIGH (ref 65–99)
POTASSIUM: 2.6 mmol/L — AB (ref 3.5–5.1)
SODIUM: 137 mmol/L (ref 135–145)
Total Bilirubin: 0.5 mg/dL (ref 0.3–1.2)
Total Protein: 6.1 g/dL — ABNORMAL LOW (ref 6.5–8.1)

## 2016-11-14 LAB — CBC WITH DIFFERENTIAL/PLATELET
BASOS PCT: 0 %
Basophils Absolute: 0 10*3/uL (ref 0.0–0.1)
EOS ABS: 0 10*3/uL (ref 0.0–0.7)
EOS PCT: 0 %
HCT: 30.5 % — ABNORMAL LOW (ref 36.0–46.0)
Hemoglobin: 9.3 g/dL — ABNORMAL LOW (ref 12.0–15.0)
LYMPHS ABS: 0.5 10*3/uL — AB (ref 0.7–4.0)
LYMPHS PCT: 6 %
MCH: 21.6 pg — ABNORMAL LOW (ref 26.0–34.0)
MCHC: 30.5 g/dL (ref 30.0–36.0)
MCV: 70.9 fL — AB (ref 78.0–100.0)
MONO ABS: 0.4 10*3/uL (ref 0.1–1.0)
MONOS PCT: 5 %
NEUTROS PCT: 89 %
Neutro Abs: 6.7 10*3/uL (ref 1.7–7.7)
PLATELETS: 193 10*3/uL (ref 150–400)
RBC: 4.3 MIL/uL (ref 3.87–5.11)
RDW: 17.8 % — AB (ref 11.5–15.5)
WBC: 7.6 10*3/uL (ref 4.0–10.5)

## 2016-11-14 LAB — I-STAT CG4 LACTIC ACID, ED: LACTIC ACID, VENOUS: 1.12 mmol/L (ref 0.5–1.9)

## 2016-11-14 LAB — CULTURE, BLOOD (ROUTINE X 2): CULTURE: NO GROWTH

## 2016-11-14 MED ORDER — ACETAMINOPHEN 325 MG PO TABS
650.0000 mg | ORAL_TABLET | Freq: Once | ORAL | Status: AC
  Administered 2016-11-14: 650 mg via ORAL
  Filled 2016-11-14: qty 2

## 2016-11-14 MED ORDER — POTASSIUM CHLORIDE 10 MEQ/100ML IV SOLN
10.0000 meq | Freq: Once | INTRAVENOUS | Status: AC
  Administered 2016-11-14: 10 meq via INTRAVENOUS
  Filled 2016-11-14: qty 100

## 2016-11-14 MED ORDER — PIPERACILLIN-TAZOBACTAM 3.375 G IVPB 30 MIN
3.3750 g | Freq: Once | INTRAVENOUS | Status: AC
  Administered 2016-11-15: 3.375 g via INTRAVENOUS
  Filled 2016-11-14: qty 50

## 2016-11-14 MED ORDER — LINEZOLID 600 MG/300ML IV SOLN
600.0000 mg | Freq: Two times a day (BID) | INTRAVENOUS | Status: DC
  Administered 2016-11-15 – 2016-11-16 (×4): 600 mg via INTRAVENOUS
  Filled 2016-11-14 (×5): qty 300

## 2016-11-14 NOTE — ED Notes (Signed)
Date and time results received: 11/14/16 2228 (use smartphrase ".now" to insert current time)  Test: potassium Critical Value: 2.6  Name of Provider Notified: Chiamaka Latka  Orders Received? Or Actions Taken?:

## 2016-11-14 NOTE — Clinical Social Work Note (Signed)
Clinical Social Work Assessment  Patient Details  Name: [name] MRN: [id number] Date of Birth: [birth year]  Date of referral:  11/14/16               Reason for consult:  Facility Placement                Permission sought to share information with:  Facility Art therapist granted to share information::  No  Name::        Agency::     Relationship::     Contact Information:     Housing/Transportation Living arrangements for the past 2 months:  Single Family Home Source of Information:  Patient ([name]) Patient Interpreter Needed:  None Criminal Activity/Legal Involvement Pertinent to Current Situation/Hospitalization:    Significant Relationships:   (Mother) Lives with:   (Mother) Do you feel safe going back to the place where you live?  Yes Need for family participation in patient care:  Yes (Comment)  Care giving concerns:  Pt and pt's mother would like for pt to be admitted due to mother's stated need for more assistance in the home with the pt.  [name] stated they are unhappy with the [facility] that they have worked with.  CSW will ask pt's RN to place a CM consult so Case Management could assist the pt once D/C'd.   Pt and pt's mother stated they do NOT WANT SNF due to the state of the SNF's that take Medicaid.  Pt and pt's mother would rather D/C home if not admitted or be admitted and then D/C home.  Per mother she is the medical power-of-attorney, daughter confirms this.  CSW asked RN to please place CM consult with an order for face-to-face should pt not be admitted  Social Worker assessment / plan:  CSW met with pt and pt's motherand confirmed pt's plan to be discharged to SNF if PT recommends or return home if D/C'd from the ED.  CSW provided active listening and validated pt's concerns.   CSW Dept WAS NOT GIVEN permission to complete FL-2 and send referrals out to SNF facilities via the hub.  Pt has been living with her  mother, prior to being admitted to [facility].  Employment status:  Disabled (Comment on whether or not currently receiving Disability) Insurance information:  Self Pay (Medicaid Pending) PT Recommendations:  Not assessed at this time Information / Referral to community resources:     Patient/Family's Response to care: Patient alert and oriented.  Patient and *p 's mother agreeable to plan.  [name] supportive and strongly involved in pt.'s care.  Pt.'s mother and pt pleasant and appreciated CSW intervention.   Patient/Family's Understanding of and Emotional Response to Diagnosis, Current Treatment, and Prognosis: Still assessing  Emotional Assessment Appearance:  Appears stated age Attitude/Demeanor/Rapport:    Affect (typically observed):  Accepting, Pleasant, Calm Orientation:  Oriented to Self, Oriented to Place, Oriented to  Time, Oriented to Situation Alcohol / Substance use:    Psych involvement (Current and /or in the community):     Discharge Needs  Concerns to be addressed:   (Alternate Home Health options) Readmission within the last 30 days:  No Current discharge risk:  None Barriers to Discharge:  No Barriers Identified   [name], LCSWA 11/14/2016, 10:24 PM

## 2016-11-14 NOTE — ED Notes (Signed)
Bed: HB71 Expected date:  Expected time:  Means of arrival:  Comments: 37f fever

## 2016-11-14 NOTE — H&P (Signed)
[name] AST:[id number] DOB: April 19, 1980 DOA: 11/14/2016     PCP: Lennie Odor, PA-C   Outpatient Specialists: Neurology Max Sane Patient coming from:    home Lives With family    Chief Complaint: fever, nausea vomiting diarrhea  HPI: [name] is a 36 y.o. 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 She reports dark urine she has chronic stage IV decubitus ulcer but states it's been currently doing better patient have had some diffuse fatigue patient endorses vague abdominal discomfort    Recently seen at [facility] hospital for 3 week history of vaginal bleeding that is currently resolved.  Regarding pertinent Chronic problems: Diagnosed with acute transverse myelitis since 2005. She is paraplegic, unable to move below the waist.  She has both bowel and urinary retention.  She has right greater than left upper extremity weakness.   IN ER:  Temp (24hrs), Avg:101.1 F (38.4 C), Min:100.2 F (37.9 C), Max:102 F (38.9 C)      on arrival  ED Triage Vitals  Enc Vitals Group     BP 11/14/16 2112 106/69     Pulse Rate 11/14/16 2112 98     Resp 11/14/16 2112 18     Temp 11/14/16 2112 100.2 F (37.9 C)     Temp Source 11/14/16 2112 Oral     SpO2 11/14/16 2112 94 %     Weight --      Height --      Head Circumference --      Peak Flow --      Pain Score 11/14/16 2035 6     Pain Loc --      Pain Edu? --      Excl. in [place]? --   RR 18 HR 98 BP 106/69 Lactic acid 1.12 WBC 7.6 Hg 9.3 Na 137 K 2.6 Cr 0.35 alb 3.3  Following Medications were ordered in ER: Medications  potassium chloride 10 mEq in 100 mL IVPB (10 mEq Intravenous New  Bag/Given 11/14/16 2318)  acetaminophen (TYLENOL) tablet 650 mg (650 mg Oral Given 11/14/16 2211)    ER provider discussed case with: ID recommended Linezolid  Hospitalist was called for admission for Sepsis  Review of Systems:    Pertinent positives include:  Fevers, chills, fatigue,   Constitutional:  No weight loss, night sweats,weight loss  HEENT:  No headaches, Difficulty swallowing,Tooth/dental problems,Sore throat,  No sneezing, itching, ear ache, nasal congestion, post nasal drip,  Cardio-vascular:  No chest pain, Orthopnea, PND, anasarca, dizziness, palpitations. no Bilateral lower extremity swelling  GI:  No heartburn, indigestion, abdominal pain, nausea, vomiting, diarrhea, change in bowel habits, loss of appetite, melena, blood in stool, hematemesis Resp:  no shortness of breath at rest. No dyspnea on exertion, No excess mucus, no productive cough, No non-productive cough, No coughing up of blood.No change in color of mucus.No wheezing. Skin:  no rash or lesions. No jaundice GU:  no dysuria, change in color of urine, no urgency or frequency. No straining to urinate.  No flank pain.  Musculoskeletal:  No joint pain or no joint swelling. No decreased range of motion. No back pain.  Psych:  No  change in mood or affect. No depression or anxiety. No memory loss.  Neuro: no localizing neurological complaints, no tingling, no weakness, no double vision, no gait abnormality, no slurred speech, no confusion  As per HPI otherwise 10 point review of systems negative.   Past Medical History: Past Medical History:  Diagnosis Date  . Bladder stones    Hx of  . Depression   . GERD (gastroesophageal reflux disease)   . Multiple sclerosis ([place])   . Paraplegia ([place])   . PTSD (post-traumatic stress disorder)   . Transverse myelitis [facility])    Past Surgical History:  Procedure Laterality Date  . CHOLECYSTECTOMY    . CYSTECTOMY W/ URETEROILEAL CONDUIT    . TONSILLECTOMY    .  WISDOM TOOTH EXTRACTION       Social History:  Ambulatory   bed bound    reports that she has never smoked. She has never used smokeless tobacco. She reports that she does not drink alcohol or use drugs.  Allergies:   Allergies  Allergen Reactions  . Iodine Anaphylaxis  . Latex Anaphylaxis  . Other Anaphylaxis  . Shellfish-Derived Products Anaphylaxis    Reaction unknown  . Vancomycin Anaphylaxis  . Aspirin Other (See Comments)    Reaction unknown  . Strawberry Extract Hives and Rash     Family History:   Family History  Problem Relation Age of Onset  . Diabetes type II Father   . CAD Father   . Dementia Father   . Stroke Father   . Seizures Father   . Diabetes type II Mother   . Hypertension Mother   . Breast cancer Other   . Diabetes type II Other     Medications: Prior to Admission medications   Medication Sig Start Date End Date Taking? Authorizing Provider  acetaminophen (TYLENOL) 325 MG tablet Take 650 mg by mouth every 6 (six) hours as needed for fever.   Yes [provider]  baclofen (LIORESAL) 20 MG tablet Take 20 mg by mouth 3 (three) times daily.   Yes [provider]  diazepam (VALIUM) 5 MG tablet Take 1-2 tablets (5-10 mg total) by mouth every 8 (eight) hours. Take 1 tablet in AM, 1 tablet in PM, and 2 tablets at bedtime Patient taking differently: Take 5-10 mg by mouth 3 (three) times daily. Take 1 tablet in AM, 1 tablet in PM, and 2 tablets at bedtime 04/02/13  Yes Robbie Lis, MD  fluticasone [facility]) 50 MCG/ACT nasal spray Place 2 sprays into the nose daily as needed for allergies.    Yes [provider]  gabapentin (NEURONTIN) 400 MG capsule Take 2 capsules (800 mg total) by mouth 3 (three) times daily. 08/13/16  Yes Jaffe, Adam R, DO  ibuprofen (ADVIL,MOTRIN) 200 MG tablet Take 200 mg by mouth every 6 (six) hours as needed for moderate pain.   Yes [provider]  lamoTRIgine (LAMICTAL) 25 MG tablet Take 125 mg  by mouth daily.   Yes [provider]  Loperamide HCl (IMODIUM A-D PO) Take 1 tablet by mouth daily as needed (diarrhea).    Yes [provider]  loratadine (CLARITIN) 5 MG/5ML syrup Take 5 mg by mouth daily.   Yes [provider]  lubiprostone (AMITIZA) 24 MCG capsule Take 24 mcg by mouth 2 (two) times daily with a meal.   Yes [provider]  omeprazole (PRILOSEC) 40 MG capsule Take 40 mg by mouth daily.   Yes [provider]  sertraline (ZOLOFT) 100 MG tablet Take 100 mg by mouth daily.   Yes [provider]  megestrol (MEGACE) 40 MG tablet Take 1 tablet (40 mg total) by mouth 2 (two) times daily. Take one tablets three times a day for 3 days, then one tablet twice daily for 3 days, then one tablet daily Patient not taking: Reported on 11/14/2016 11/09/16   [name], CNM    Physical Exam: Patient Vitals for the past 24 hrs:  BP Temp Temp src Pulse Resp SpO2  11/14/16 2317 - (!) 102 F (38.9 C) Rectal - - -  11/14/16 2112 106/69 100.2 F (37.9 C) Oral 98 18 94 %    1. General:  in No Acute distress 2. Psychological: Alert and   Oriented 3. Head/ENT:   Dry Mucous Membranes                          Head Non traumatic, neck supple                           Poor Dentition 4. SKIN:   decreased Skin turgor,  Skin clean Dry STAGE IV sacral decubitus  5. Heart: Regular rate and rhythm no  Murmur, Rub or gallop 6. Lungs: no wheezes or crackles   7. Abdomen: Soft,  non-tender, Non distended 8. Lower extremities: no clubbing, cyanosis, or edema 9. Neurologically Paraplegic chronicaly 10. MSK: Normal range of motion   body mass index is unknown because there is no height or weight on file.  Labs on Admission:   Labs on Admission: I have personally reviewed following labs and imaging studies  CBC:  Recent Labs Lab 11/09/16 1830 11/14/16 2130  WBC 8.8 7.6  NEUTROABS  --  6.7  HGB 9.7* 9.3*  HCT 32.3* 30.5*  MCV 73.4*  70.9*  PLT 203 765   Basic Metabolic Panel:  Recent Labs Lab 11/09/16 1805 11/14/16 2130  NA 135 137  K 3.6 2.6*  CL 103 100*  CO2 22 28  GLUCOSE 104* 121*  BUN 7 6  CREATININE 0.36* 0.35*  CALCIUM 8.4* 8.3*   GFR: CrCl cannot be calculated (Unknown ideal weight.). Liver Function Tests:  Recent Labs Lab 11/09/16 1805 11/14/16 2130  AST 18 10*  ALT 13* 9*  ALKPHOS 98 87  BILITOT 0.8 0.5  PROT 6.9 6.1*  ALBUMIN 3.7 3.3*   No results for input(s): LIPASE, AMYLASE in the last 168 hours. No results for input(s): AMMONIA in the last 168 hours. Coagulation Profile: No results for input(s): INR, PROTIME in the last 168 hours. Cardiac Enzymes: No results for input(s): CKTOTAL, CKMB, CKMBINDEX, TROPONINI in the last 168 hours. BNP (last 3 results) No results for input(s): PROBNP in the last 8760 hours. HbA1C: No results for input(s): HGBA1C in the last 72 hours. CBG: No results for input(s): GLUCAP in the last 168 hours. Lipid Profile: No results for input(s): CHOL, HDL, LDLCALC, TRIG, CHOLHDL, LDLDIRECT in the last 72 hours. Thyroid Function Tests: No results for input(s): TSH, T4TOTAL, FREET4, T3FREE, THYROIDAB in the last 72 hours. Anemia Panel: No results for input(s): VITAMINB12, FOLATE, FERRITIN, TIBC, IRON, RETICCTPCT in the last 72 hours. Urine analysis:    Component Value Date/Time   COLORURINE AMBER (A) 07/17/2016 1938   APPEARANCEUR CLOUDY (A) 07/17/2016 1938   LABSPEC 1.014 07/17/2016 1938   PHURINE 8.0 07/17/2016 1938   GLUCOSEU NEGATIVE 07/17/2016 1938  HGBUR NEGATIVE 07/17/2016 1938   BILIRUBINUR NEGATIVE 07/17/2016 1938   KETONESUR NEGATIVE 07/17/2016 1938   PROTEINUR 100 (A) 07/17/2016 1938   UROBILINOGEN 2.0 (H) 10/29/2014 1627   NITRITE POSITIVE (A) 07/17/2016 1938   LEUKOCYTESUR MODERATE (A) 07/17/2016 1938   Sepsis Labs: @LABRCNTIP (procalcitonin:4,lacticidven:4) ) Recent Results (from the past 240 hour(s))  Culture, blood (routine x  2)     Status: None   Collection Time: 11/09/16  5:43 PM  Result Value Ref Range Status   Specimen Description BLOOD RIGHT ARM  Final   Special Requests   Final    BOTTLES DRAWN AEROBIC AND ANAEROBIC Blood Culture results may not be optimal due to an excessive volume of blood received in culture bottles   Culture   Final    NO GROWTH 5 DAYS Performed at [facility], [place] [address], [place], [place] [address]    Report Status 11/14/2016 FINAL  Final      UA  evidence of UTI    No results found for: HGBA1C  CrCl cannot be calculated (Unknown ideal weight.).  BNP (last 3 results) No results for input(s): PROBNP in the last 8760 hours.   ECG REPORT ordered  There were no vitals filed for this visit.   Cultures:    Component Value Date/Time   SDES BLOOD RIGHT ARM 11/09/2016 1743   SPECREQUEST  11/09/2016 1743    BOTTLES DRAWN AEROBIC AND ANAEROBIC Blood Culture results may not be optimal due to an excessive volume of blood received in culture bottles   CULT  11/09/2016 1743    NO GROWTH 5 DAYS Performed at [facility], [place] [address], [place], [place] [address]    REPTSTATUS 11/14/2016 FINAL 11/09/2016 1743     Radiological Exams on Admission: Dg Chest 2 View  Result Date: 11/14/2016 CLINICAL DATA:  Sepsis.  Fever. EXAM: CHEST  2 VIEW COMPARISON:  07/25/2016 FINDINGS: The cardiomediastinal contours are normal. The lungs are clear. Pulmonary vasculature is normal. No consolidation, pleural effusion, or pneumothorax. No acute osseous abnormalities are seen. IMPRESSION: Clear lungs. Electronically Signed   By: Jeb Levering M.D.   On: 11/14/2016 22:44    Chart has been reviewed    Assessment/Plan   36 y.o. 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 Sepsis ([place]) - source could be possibly urinary versus decubitus  ulcer versus gastroenteritis, for now continue broad-spectrum antibiotics by nasal rate and Zosyn patient is allergic to vancomycin. Appreciate ID input. Await results of blood culture MRSA swab, urine culture, gastric and respiratory panels . Decubitus ulcer of buttock, stage 4 ([place]) - obtain wound care consult wound appears to be without evidence of infection at this point will obtain CT to evaluate for deep osteomyelitis . History of transverse myelitis patient is bed bound at baseline paraplegic results in the multiple complications continue baclofen Neurontin and Valium for muscle spasms . Hypokalemia - - will replace and repeat in AM,  check magnesium level and replace as needed  abdominal discomfort - given patient's history of paraplegia she is unable to localize her pain. We'll order CT of abdomen and pelvis to evaluate further . Dehydration will rehydrate and follow fluid status    Other plan as per orders.  DVT prophylaxis:    Lovenox     Code Status:  FULL CODE  as per patient    Family Communication:  Family   at  Bedside  plan of care was discussed with  mother  Disposition Plan:     To home once workup is complete and patient is stable                     Care management  consulted                          Consults called: ID    Admission status:  inpatient      Level of care    tele         I have spent a total of 56 min on this admission   Iam Lipson 11/14/2016, 12:12 AM    [facility]  Pager [phone]   after 2 AM please page floor coverage PA If 7AM-7PM, please contact the day team taking care of the patient  [email or web address]  Password [email or web address]

## 2016-11-14 NOTE — ED Triage Notes (Signed)
Pt return to ED for eval of recurrent URI symptoms. Also reports 101 temp tylenol 1000 mg given  , recheck temp prior to EMS arrival was 103 oral. VSS 125/70 , 104/62, 97/60. HR 95 to 99 BPM, Sat 95 to 97%. CBG 153

## 2016-11-14 NOTE — ED Notes (Signed)
Per EMS personal pt home environment is very questionable for quality of care  As home environment was very unkept and unsanitary.

## 2016-11-14 NOTE — Progress Notes (Signed)
Pt and pt's mother would like for pt to be admitted due to mother's stated need for more assistance in the home with the pt.  [name] stated they are unhappy with the [facility] that they have worked with.  CSW will ask pt's RN to place a CM consult so Case Management could assist the pt once D/C'd.   Pt and pt's mother stated they do NOT WANT SNF due to the state of the SNF's that take Medicaid.  Pt and pt's mother would rather D/C home if not admitted or be admitted and then D/C home.  Per mother she is the medical power-of-attorney, daughter confirms this.  CSW asked RN to please place CM consult with an order for face-to-face should pt not be admitted.  CSW met with pt and pt's motherand confirmed pt's plan to be discharged to SNF if PT recommends or return home if D/C'd from the ED.  CSW provided active listening and validated pt's concerns.   CSW Dept WAS NOT GIVEN permission to complete FL-2 and send referrals out to SNF facilities via the hub. Pt has been living with her mother, prior to being admitted to [facility].  Please reconsult if future social work needs arise.  CSW signing off, as social work intervention is no longer needed.   Alphonse Guild. Adis Sturgill, Reed Pandy, CSI Clinical Social Worker Ph: [phone]

## 2016-11-14 NOTE — ED Provider Notes (Signed)
[facility] DEPT Provider Note   CSN: [id number] Arrival date & time: 11/14/16  2027     History   Chief Complaint Chief Complaint  Patient presents with  . URI    HPI [name] is a 36 y.o. female.  HPI   36 year old female with a medical history of paraplegia secondary to MS and transverse myelitis, GERD, depression, chronic decubitus ulcer presents today with complaints of headache, fever, and fatigue.  Patient reports that she has had minor cough and minor shortness of breath.  She notes earlier in the week she had diarrhea and vomitting, none presently.  She notes she has urostomy in her urine has been slightly darker than usual.  Patient has a stage IV decubitus ulcer of the buttock but denies any surrounding redness.  Mother notes that she has been extremely fatigued at home.  T-max of 103, Tylenol was given prior to arrival in the ED.    Past Medical History:  Diagnosis Date  . Bladder stones    Hx of  . Depression   . GERD (gastroesophageal reflux disease)   . Multiple sclerosis ([place])   . Paraplegia ([place])   . PTSD (post-traumatic stress disorder)   . Transverse myelitis [facility])     Patient Active Problem List   Diagnosis Date Noted  . Sepsis ([place]) 11/14/2016  . Dehydration 11/14/2016  . BV (bacterial vaginosis) 07/17/2016  . Decubitus ulcer of buttock, stage 4 ([place]) 07/17/2016  . Fecal incontinence 02/23/2016  . 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 Myositis 02/16/2016  . Infected decubitus ulcer 02/15/2016  . Pressure injury of skin 01/08/2016  . Ileus ([place]) 10/29/2014  . Constipation 10/29/2014  . Obstipation 09/27/2014  . UTI (urinary tract infection) 03/31/2013  . Influenza 03/30/2013  . Nausea vomiting and diarrhea 03/30/2013  . Hypokalemia 02/16/2012  . Multiple sclerosis ([place]) 10/16/2011  . Paraplegia ([place])   . History of transverse myelitis     Past Surgical History:  Procedure Laterality Date  . CHOLECYSTECTOMY    .  CYSTECTOMY W/ URETEROILEAL CONDUIT    . TONSILLECTOMY    . WISDOM TOOTH EXTRACTION      OB History    Gravida Para Term Preterm AB Living   0 0 0 0 0 0   SAB TAB Ectopic Multiple Live Births   0 0 0 0 0       Home Medications    Prior to Admission medications   Medication Sig Start Date End Date Taking? Authorizing Provider  acetaminophen (TYLENOL) 325 MG tablet Take 650 mg by mouth every 6 (six) hours as needed for fever.   Yes [provider]  baclofen (LIORESAL) 20 MG tablet Take 20 mg by mouth 3 (three) times daily.   Yes [provider]  diazepam (VALIUM) 5 MG tablet Take 1-2 tablets (5-10 mg total) by mouth every 8 (eight) hours. Take 1 tablet in AM, 1 tablet in PM, and 2 tablets at bedtime Patient taking differently: Take 5-10 mg by mouth 3 (three) times daily. Take 1 tablet in AM, 1 tablet in PM, and 2 tablets at bedtime 04/02/13  Yes Robbie Lis, MD  fluticasone [facility]) 50 MCG/ACT nasal spray Place 2 sprays into the nose daily as needed for allergies.    Yes [provider]  gabapentin (NEURONTIN) 400 MG capsule Take 2 capsules (800 mg total) by mouth 3 (three) times daily. 08/13/16  Yes Jaffe, Adam R, DO  ibuprofen (ADVIL,MOTRIN) 200 MG tablet Take 200  mg by mouth every 6 (six) hours as needed for moderate pain.   Yes [provider]  lamoTRIgine (LAMICTAL) 25 MG tablet Take 125 mg by mouth daily.   Yes [provider]  Loperamide HCl (IMODIUM A-D PO) Take 1 tablet by mouth daily as needed (diarrhea).    Yes [provider]  loratadine (CLARITIN) 5 MG/5ML syrup Take 5 mg by mouth daily.   Yes [provider]  lubiprostone (AMITIZA) 24 MCG capsule Take 24 mcg by mouth 2 (two) times daily with a meal.   Yes [provider]  omeprazole (PRILOSEC) 40 MG capsule Take 40 mg by mouth daily.   Yes [provider]  sertraline (ZOLOFT) 100 MG tablet Take 100 mg by mouth daily.   Yes [provider]  megestrol (MEGACE) 40 MG tablet Take 1 tablet (40 mg total) by mouth 2 (two) times daily. Take one tablets three times a day for 3 days, then one tablet twice daily for 3 days, then one tablet daily Patient not taking: Reported on 11/14/2016 11/09/16   [name], CNM    Family History Family History  Problem Relation Age of Onset  . Diabetes type II Father   . CAD Father   . Dementia Father   . Stroke Father   . Seizures Father   . Diabetes type II Mother   . Hypertension Mother   . Breast cancer Other   . Diabetes type II Other     Social History Social History  Substance Use Topics  . Smoking status: Never Smoker  . Smokeless tobacco: Never Used  . Alcohol use No     Allergies   Iodine; Latex; Other; Shellfish-derived products; Vancomycin; Aspirin; and Strawberry extract   Review of Systems Review of Systems  All other systems reviewed and are negative.   Physical Exam Updated Vital Signs BP 106/69 (BP Location: Left Arm)   Pulse 98   Temp (!) 102 F (38.9 C) (Rectal)   Resp 18   SpO2 94%   Physical Exam  Constitutional: She is oriented to person, place, and time. She appears well-developed and well-nourished.  HENT:  Head: Normocephalic and atraumatic.  Eyes: Pupils are equal, round, and reactive to light. Conjunctivae are normal. Right eye exhibits no discharge. Left eye exhibits no discharge. No scleral icterus.  Neck: Normal range of motion. No JVD present. No tracheal deviation present.  Pulmonary/Chest: Effort normal. No stridor.  Abdominal:  Urostomy with no signs of surround infection; urine dark   Neurological: She is alert and oriented to person, place, and time. Coordination normal.  Skin:  Decubitus ulcer on the right buttocks with no surrounding redness, minimal discharge  Psychiatric: She has a normal mood and affect. Her behavior is normal. Judgment and thought content normal.  Nursing note and vitals reviewed.    ED Treatments  / Results  Labs (all labs ordered are listed, but only abnormal results are displayed) Labs Reviewed  CBC WITH DIFFERENTIAL/PLATELET - Abnormal; Notable for the following:       Result Value   Hemoglobin 9.3 (*)    HCT 30.5 (*)    MCV 70.9 (*)    MCH 21.6 (*)    RDW 17.8 (*)    Lymphs Abs 0.5 (*)    All other components within normal limits  COMPREHENSIVE METABOLIC PANEL - Abnormal; Notable for the following:    Potassium 2.6 (*)    Chloride 100 (*)    Glucose, Bld  121 (*)    Creatinine, Ser 0.35 (*)    Calcium 8.3 (*)    Total Protein 6.1 (*)    Albumin 3.3 (*)    AST 10 (*)    ALT 9 (*)    All other components within normal limits  URINALYSIS, ROUTINE W REFLEX MICROSCOPIC - Abnormal; Notable for the following:    Color, Urine YELLOW (*)    APPearance CLOUDY (*)    Hgb urine dipstick MODERATE (*)    Ketones, ur 5 (*)    Protein, ur 30 (*)    Nitrite POSITIVE (*)    Leukocytes, UA LARGE (*)    Bacteria, UA MANY (*)    All other components within normal limits  CULTURE, BLOOD (ROUTINE X 2)  CULTURE, BLOOD (ROUTINE X 2)  AEROBIC CULTURE (SUPERFICIAL SPECIMEN)  I-STAT CG4 LACTIC ACID, ED  I-STAT CG4 LACTIC ACID, ED    EKG  EKG Interpretation None       Radiology Dg Chest 2 View  Result Date: 11/14/2016 CLINICAL DATA:  Sepsis.  Fever. EXAM: CHEST  2 VIEW COMPARISON:  07/25/2016 FINDINGS: The cardiomediastinal contours are normal. The lungs are clear. Pulmonary vasculature is normal. No consolidation, pleural effusion, or pneumothorax. No acute osseous abnormalities are seen. IMPRESSION: Clear lungs. Electronically Signed   By: Jeb Levering M.D.   On: 11/14/2016 22:44    Procedures Procedures (including critical care time)  Medications Ordered in ED Medications  potassium chloride 10 mEq in 100 mL IVPB (10 mEq Intravenous New Bag/Given 11/14/16 2318)  piperacillin-tazobactam (ZOSYN) IVPB 3.375 g (not administered)  linezolid (ZYVOX) IVPB 600 mg (not  administered)  acetaminophen (TYLENOL) tablet 650 mg (650 mg Oral Given 11/14/16 2211)     Initial Impression / Assessment and Plan / ED Course  I have reviewed the triage vital signs and the nursing notes.  Pertinent labs & imaging results that were available during my care of the patient were reviewed by me and considered in my medical decision making (see chart for details).     Final Clinical Impressions(s) / ED Diagnoses   Final diagnoses:  Hypokalemia  Sepsis, due to unspecified organism ([place])   Labs: I-STAT lactic acid, CBC, CMP, blood cultures  Imaging: DG Chest 2 View  Consults:   Therapeutics: Zosyn  Discharge Meds:   Assessment/Plan: 36 year old female presents today with likely sepsis.  Patient tachycardic with elevated temperature.  Patient has had nausea vomiting and diarrhea recently at home, none here.  Patient also complaining of very mild upper respiratory symptoms, chest x-ray clear, lung sounds clear.  Patient has had darker urine than normal, urine unlikely to be beneficial as he will be contaminated.  Patient also has a chronic decubitus ulcer on the left buttocks this does not appear to be acutely infected, wound culture sent.  Hospitalist service consulted for admission.  Patient is allergic to vancomycin unable to get broad-spectrum antibiotics here.  I consulted infectious disease and spoke with Dr. Johnnye Sima recommended Linezolid  at this time.   New Prescriptions New Prescriptions   No medications on file     Francee Gentile 11/14/16 2323    Okey Regal, PA-C 11/14/16 Arnoldo Lenis    Gareth Morgan, MD 11/16/16 [phone]

## 2016-11-15 ENCOUNTER — Inpatient Hospital Stay (HOSPITAL_COMMUNITY)
Admit: 2016-11-15 | Discharge: 2016-11-15 | Disposition: A | Discharge status: Home / Self Care | Payer: Medicaid Other | Attending: Internal Medicine | Admitting: Internal Medicine

## 2016-11-15 ENCOUNTER — Inpatient Hospital Stay (HOSPITAL_COMMUNITY): Payer: Medicaid Other

## 2016-11-15 DIAGNOSIS — L89323 Pressure ulcer of left buttock, stage 3: Secondary | ICD-10-CM | POA: Diagnosis present

## 2016-11-15 DIAGNOSIS — A419 Sepsis, unspecified organism: Principal | ICD-10-CM

## 2016-11-15 LAB — LACTIC ACID, PLASMA
LACTIC ACID, VENOUS: 1.3 mmol/L (ref 0.5–1.9)
LACTIC ACID, VENOUS: 0.8 mmol/L (ref 0.5–1.9)

## 2016-11-15 LAB — BLOOD CULTURE ID PANEL (REFLEXED)
Acinetobacter baumannii: NOT DETECTED
CANDIDA PARAPSILOSIS: NOT DETECTED
CANDIDA TROPICALIS: NOT DETECTED
CARBAPENEM RESISTANCE: NOT DETECTED
Candida albicans: NOT DETECTED
Candida glabrata: NOT DETECTED
Candida krusei: NOT DETECTED
Enterobacter cloacae complex: NOT DETECTED
Enterobacteriaceae species: NOT DETECTED
Enterococcus species: NOT DETECTED
Escherichia coli: NOT DETECTED
Haemophilus influenzae: NOT DETECTED
KLEBSIELLA OXYTOCA: NOT DETECTED
KLEBSIELLA PNEUMONIAE: NOT DETECTED
Listeria monocytogenes: NOT DETECTED
Methicillin resistance: NOT DETECTED
Neisseria meningitidis: NOT DETECTED
PROTEUS SPECIES: NOT DETECTED
Pseudomonas aeruginosa: NOT DETECTED
SERRATIA MARCESCENS: NOT DETECTED
STAPHYLOCOCCUS AUREUS BCID: NOT DETECTED
STREPTOCOCCUS PNEUMONIAE: NOT DETECTED
Staphylococcus species: DETECTED — AB
Streptococcus agalactiae: NOT DETECTED
Streptococcus pyogenes: NOT DETECTED
Streptococcus species: NOT DETECTED
Vancomycin resistance: NOT DETECTED

## 2016-11-15 LAB — GASTROINTESTINAL PANEL BY PCR, STOOL (REPLACES STOOL CULTURE)
ADENOVIRUS F40/41: NOT DETECTED
ASTROVIRUS: NOT DETECTED
Campylobacter species: NOT DETECTED
Cryptosporidium: NOT DETECTED
Cyclospora cayetanensis: NOT DETECTED
ENTAMOEBA HISTOLYTICA: NOT DETECTED
ENTEROAGGREGATIVE E COLI (EAEC): NOT DETECTED
ENTEROPATHOGENIC E COLI (EPEC): NOT DETECTED
ENTEROTOXIGENIC E COLI (ETEC): NOT DETECTED
GIARDIA LAMBLIA: NOT DETECTED
NOROVIRUS GI/GII: NOT DETECTED
Plesimonas shigelloides: NOT DETECTED
Rotavirus A: NOT DETECTED
SHIGA LIKE TOXIN PRODUCING E COLI (STEC): NOT DETECTED
Salmonella species: NOT DETECTED
Sapovirus (I, II, IV, and V): NOT DETECTED
Shigella/Enteroinvasive E coli (EIEC): NOT DETECTED
VIBRIO CHOLERAE: NOT DETECTED
Vibrio species: NOT DETECTED
Yersinia enterocolitica: NOT DETECTED

## 2016-11-15 LAB — TSH: TSH: 0.291 u[IU]/mL — AB (ref 0.350–4.500)

## 2016-11-15 LAB — COMPREHENSIVE METABOLIC PANEL
ALK PHOS: 85 U/L (ref 38–126)
ALT: 9 U/L — ABNORMAL LOW (ref 14–54)
ANION GAP: 9 (ref 5–15)
AST: 11 U/L — ABNORMAL LOW (ref 15–41)
Albumin: 3.3 g/dL — ABNORMAL LOW (ref 3.5–5.0)
BUN: <5 mg/dL — ABNORMAL LOW (ref 6–20)
CALCIUM: 7.9 mg/dL — AB (ref 8.9–10.3)
CO2: 28 mmol/L (ref 22–32)
Chloride: 100 mmol/L — ABNORMAL LOW (ref 101–111)
Creatinine, Ser: 0.35 mg/dL — ABNORMAL LOW (ref 0.44–1.00)
GFR calc non Af Amer: >60 mL/min (ref >60)
Glucose, Bld: 116 mg/dL — ABNORMAL HIGH (ref 65–99)
Potassium: 2.6 mmol/L — CL (ref 3.5–5.1)
SODIUM: 137 mmol/L (ref 135–145)
Total Bilirubin: 0.7 mg/dL (ref 0.3–1.2)
Total Protein: 6.1 g/dL — ABNORMAL LOW (ref 6.5–8.1)

## 2016-11-15 LAB — IRON AND TIBC
Iron: 15 ug/dL — ABNORMAL LOW (ref 28–170)
SATURATION RATIOS: 5 % — AB (ref 10.4–31.8)
TIBC: 295 ug/dL (ref 250–450)
UIBC: 280 ug/dL

## 2016-11-15 LAB — RESPIRATORY PANEL BY PCR
Adenovirus: NOT DETECTED
BORDETELLA PERTUSSIS-RVPCR: NOT DETECTED
CORONAVIRUS HKU1-RVPPCR: NOT DETECTED
CORONAVIRUS NL63-RVPPCR: NOT DETECTED
CORONAVIRUS OC43-RVPPCR: NOT DETECTED
Chlamydophila pneumoniae: NOT DETECTED
Coronavirus 229E: NOT DETECTED
INFLUENZA A H3-RVPPCR: NOT DETECTED
Influenza A H1 2009: NOT DETECTED
Influenza A H1: NOT DETECTED
Influenza A: NOT DETECTED
Influenza B: NOT DETECTED
METAPNEUMOVIRUS-RVPPCR: NOT DETECTED
MYCOPLASMA PNEUMONIAE-RVPPCR: NOT DETECTED
PARAINFLUENZA VIRUS 1-RVPPCR: NOT DETECTED
PARAINFLUENZA VIRUS 2-RVPPCR: NOT DETECTED
PARAINFLUENZA VIRUS 3-RVPPCR: NOT DETECTED
Parainfluenza Virus 4: NOT DETECTED
RHINOVIRUS / ENTEROVIRUS - RVPPCR: NOT DETECTED
Respiratory Syncytial Virus: NOT DETECTED

## 2016-11-15 LAB — RETICULOCYTES
RBC.: 4.35 MIL/uL (ref 3.87–5.11)
RETIC COUNT ABSOLUTE: 74 10*3/uL (ref 19.0–186.0)
Retic Ct Pct: 1.7 % (ref 0.4–3.1)

## 2016-11-15 LAB — VITAMIN B12: VITAMIN B 12: 212 pg/mL (ref 180–914)

## 2016-11-15 LAB — BASIC METABOLIC PANEL
ANION GAP: 6 (ref 5–15)
BUN: <5 mg/dL — ABNORMAL LOW (ref 6–20)
CALCIUM: 8.1 mg/dL — AB (ref 8.9–10.3)
CHLORIDE: 108 mmol/L (ref 101–111)
CO2: 26 mmol/L (ref 22–32)
Glucose, Bld: 87 mg/dL (ref 65–99)
Potassium: 4.2 mmol/L (ref 3.5–5.1)
SODIUM: 140 mmol/L (ref 135–145)

## 2016-11-15 LAB — PHOSPHORUS: Phosphorus: 1.8 mg/dL — ABNORMAL LOW (ref 2.5–4.6)

## 2016-11-15 LAB — FOLATE: Folate: 9.4 ng/mL (ref >5.9)

## 2016-11-15 LAB — MAGNESIUM
MAGNESIUM: 1.7 mg/dL (ref 1.7–2.4)
Magnesium: 1.8 mg/dL (ref 1.7–2.4)

## 2016-11-15 LAB — CBC
HCT: 31.1 % — ABNORMAL LOW (ref 36.0–46.0)
HEMOGLOBIN: 9.5 g/dL — AB (ref 12.0–15.0)
MCH: 21.8 pg — AB (ref 26.0–34.0)
MCHC: 30.5 g/dL (ref 30.0–36.0)
MCV: 71.5 fL — ABNORMAL LOW (ref 78.0–100.0)
Platelets: 218 10*3/uL (ref 150–400)
RBC: 4.35 MIL/uL (ref 3.87–5.11)
RDW: 18 % — ABNORMAL HIGH (ref 11.5–15.5)
WBC: 8.1 10*3/uL (ref 4.0–10.5)

## 2016-11-15 LAB — PROCALCITONIN: Procalcitonin: 4.26 ng/mL

## 2016-11-15 LAB — PROTIME-INR
INR: 1.14
PROTHROMBIN TIME: 14.7 s (ref 11.4–15.2)

## 2016-11-15 LAB — MRSA PCR SCREENING: MRSA by PCR: NEGATIVE

## 2016-11-15 LAB — T4, FREE: FREE T4: 1.29 ng/dL — AB (ref 0.61–1.12)

## 2016-11-15 LAB — C-REACTIVE PROTEIN: CRP: 15.7 mg/dL — AB (ref <1.0)

## 2016-11-15 LAB — FERRITIN: FERRITIN: 32 ng/mL (ref 11–307)

## 2016-11-15 LAB — SEDIMENTATION RATE: Sed Rate: 10 mm/hr (ref 0–22)

## 2016-11-15 LAB — APTT: aPTT: 26 seconds (ref 24–36)

## 2016-11-15 MED ORDER — ORAL CARE MOUTH RINSE
15.0000 mL | Freq: Two times a day (BID) | OROMUCOSAL | Status: DC
  Administered 2016-11-15 – 2016-11-17 (×3): 15 mL via OROMUCOSAL

## 2016-11-15 MED ORDER — DIAZEPAM 5 MG PO TABS
5.0000 mg | ORAL_TABLET | ORAL | Status: DC
  Administered 2016-11-15 – 2016-11-17 (×6): 5 mg via ORAL
  Filled 2016-11-15 (×5): qty 1

## 2016-11-15 MED ORDER — POTASSIUM CHLORIDE CRYS ER 20 MEQ PO TBCR
40.0000 meq | EXTENDED_RELEASE_TABLET | Freq: Once | ORAL | Status: AC
Start: 2016-11-15 — End: 2016-11-15
  Administered 2016-11-15: 40 meq via ORAL
  Filled 2016-11-15: qty 2

## 2016-11-15 MED ORDER — ONDANSETRON HCL 4 MG PO TABS: 4.0000 mg | ORAL_TABLET | Freq: Four times a day (QID) | ORAL | Status: DC | PRN

## 2016-11-15 MED ORDER — SODIUM CHLORIDE 0.9 % IV SOLN
INTRAVENOUS | Status: DC
  Administered 2016-11-15 (×2): via INTRAVENOUS

## 2016-11-15 MED ORDER — IOPAMIDOL (ISOVUE-300) INJECTION 61%
INTRAVENOUS | Status: AC
  Filled 2016-11-15: qty 30

## 2016-11-15 MED ORDER — LORATADINE 5 MG/5ML PO SYRP: 5.0000 mg | ORAL_SOLUTION | Freq: Every day | ORAL | Status: DC

## 2016-11-15 MED ORDER — DIAZEPAM 5 MG PO TABS
10.0000 mg | ORAL_TABLET | Freq: Every day | ORAL | Status: DC
  Administered 2016-11-15 – 2016-11-16 (×3): 10 mg via ORAL
  Filled 2016-11-15 (×3): qty 2

## 2016-11-15 MED ORDER — GABAPENTIN 400 MG PO CAPS
800.0000 mg | ORAL_CAPSULE | Freq: Three times a day (TID) | ORAL | Status: DC
  Administered 2016-11-15 – 2016-11-17 (×7): 800 mg via ORAL
  Filled 2016-11-15 (×7): qty 2

## 2016-11-15 MED ORDER — SERTRALINE HCL 100 MG PO TABS
100.0000 mg | ORAL_TABLET | Freq: Every day | ORAL | Status: DC
  Administered 2016-11-15 – 2016-11-17 (×3): 100 mg via ORAL
  Filled 2016-11-15 (×3): qty 1

## 2016-11-15 MED ORDER — ACETAMINOPHEN 325 MG PO TABS
650.0000 mg | ORAL_TABLET | Freq: Four times a day (QID) | ORAL | Status: DC | PRN
  Administered 2016-11-15: 650 mg via ORAL
  Filled 2016-11-15: qty 2

## 2016-11-15 MED ORDER — LAMOTRIGINE 25 MG PO TABS
125.0000 mg | ORAL_TABLET | Freq: Every day | ORAL | Status: DC
  Administered 2016-11-15 – 2016-11-17 (×3): 125 mg via ORAL
  Filled 2016-11-15 (×3): qty 1

## 2016-11-15 MED ORDER — ACETAMINOPHEN 650 MG RE SUPP: 650.0000 mg | Freq: Four times a day (QID) | RECTAL | Status: DC | PRN

## 2016-11-15 MED ORDER — PIPERACILLIN-TAZOBACTAM 3.375 G IVPB
3.3750 g | Freq: Three times a day (TID) | INTRAVENOUS | Status: DC
  Administered 2016-11-15 – 2016-11-17 (×7): 3.375 g via INTRAVENOUS
  Filled 2016-11-15 (×7): qty 50

## 2016-11-15 MED ORDER — POTASSIUM CHLORIDE 10 MEQ/100ML IV SOLN
10.0000 meq | INTRAVENOUS | Status: AC
  Administered 2016-11-15 (×4): 10 meq via INTRAVENOUS
  Filled 2016-11-15 (×4): qty 100

## 2016-11-15 MED ORDER — LORATADINE 10 MG PO TABS
5.0000 mg | ORAL_TABLET | Freq: Every day | ORAL | Status: DC
  Administered 2016-11-15 – 2016-11-17 (×3): 5 mg via ORAL
  Filled 2016-11-15 (×3): qty 1

## 2016-11-15 MED ORDER — BACLOFEN 20 MG PO TABS
20.0000 mg | ORAL_TABLET | Freq: Three times a day (TID) | ORAL | Status: DC
  Administered 2016-11-15 – 2016-11-17 (×7): 20 mg via ORAL
  Filled 2016-11-15 (×7): qty 1

## 2016-11-15 MED ORDER — ONDANSETRON HCL 4 MG/2ML IJ SOLN: 4.0000 mg | Freq: Four times a day (QID) | INTRAMUSCULAR | Status: DC | PRN

## 2016-11-15 MED ORDER — POTASSIUM CHLORIDE CRYS ER 20 MEQ PO TBCR
60.0000 meq | EXTENDED_RELEASE_TABLET | Freq: Once | ORAL | Status: AC
  Administered 2016-11-15: 60 meq via ORAL
  Filled 2016-11-15: qty 3

## 2016-11-15 MED ORDER — HYDROCODONE-ACETAMINOPHEN 5-325 MG PO TABS
1.0000 | ORAL_TABLET | ORAL | Status: DC | PRN
  Administered 2016-11-16: 2 via ORAL
  Filled 2016-11-15: qty 2

## 2016-11-15 MED ORDER — CHLORHEXIDINE GLUCONATE 0.12 % MT SOLN
15.0000 mL | Freq: Two times a day (BID) | OROMUCOSAL | Status: DC
  Administered 2016-11-15 – 2016-11-17 (×5): 15 mL via OROMUCOSAL
  Filled 2016-11-15 (×5): qty 15

## 2016-11-15 NOTE — Progress Notes (Signed)
[facility] Progress Note  Patient: [name] CZY:[id number]   PCP: Lennie Odor, PA-C DOB: April 06, 1980   DOA: 11/14/2016   DOS: 11/15/2016   Date of Service: the patient was seen and examined on 11/15/2016  Subjective: Feeling better. No more fever. Nausea resolved. No vomiting here as well. Diarrhea also improved.  Brief hospital course: Pt. with PMH of transverse myelitis, paraplegia, decubitus ulcer, ileal conduit creation with recurrent UTI, GERD; admitted on 11/14/2016, presented with complaint of fever and nausea vomiting, was found to have sigmoid colitis as well as possible cystitis. Currently further plan is continue IV antibiotics.  Assessment and Plan: 1. Sigmoid colitis. Presented with complaints of nausea vomiting and fever. Currently symptoms are almost resolved. CT scan shows evidence of distal sigmoid colon on thickening concerning for infectious process. Patient started on IV Zosyn which I would continue. Blood cultures performed. Monitor the results.  2. Possible cystitis. Fluid collection underneath the uterus appears to be old bladder with thick wall concerning for cystitis. Patient is already on IV antibiotics will monitor the results of the blood cultures.  3. History of osteomyelitis. Chronic stage IV sacral decubitus ulcer. Appreciate wound care assistance. There was some concern for possible osteomyelitis although the CT scan shows no significant changes and the wound care RN feels that the wound does not appear infected as well. Patient was started on broad-spectrum IV Zyvox as well as IV Zosyn. We will monitor the cultures for next 48 hours and if negative discontinue the Zyvox and just treat with Zosyn versus Augmentin depending on cultures.  4. Transverse myelitis. Chronic paraplegia. Spasticity. Continuing home regimen without any changes.  5. Staring spells. Concern for possible seizures. MRI brain currently pending EEG results  currently pending. No further events so far. We'll advance her diet to clear liquid diet.  6. Hypokalemia. We'll replace orally. Recheck in the afternoon.  7. Iron deficiency anemia. We'll require iron replacement as well. As well as B-12 replacement.  8. Low TSH. Will check free T4 tomorrow.  Diet: Clear liquid diet DVT Prophylaxis: subcutaneous Heparin  Advance goals of care discussion: full code  Family Communication: family was present at bedside, at the time of interview. The pt provided permission to discuss medical plan with the family. Opportunity was given to ask question and all questions were answered satisfactorily.   Disposition:  Discharge to home.  Consultants: none Procedures: none  Antibiotics: Anti-infectives    Start     Dose/Rate Route Frequency Ordered Stop   11/15/16 0600  piperacillin-tazobactam (ZOSYN) IVPB 3.375 g     3.375 g 12.5 mL/hr over 240 Minutes Intravenous Every 8 hours 11/15/16 0312     11/15/16 0000  linezolid (ZYVOX) IVPB 600 mg     600 mg 300 mL/hr over 60 Minutes Intravenous Every 12 hours 11/14/16 2347     11/14/16 2345  piperacillin-tazobactam (ZOSYN) IVPB 3.375 g     3.375 g 100 mL/hr over 30 Minutes Intravenous  Once 11/14/16 2331 11/15/16 0245       Objective: Physical Exam: Vitals:   11/14/16 2317 11/15/16 0259 11/15/16 0332 11/15/16 1339  BP:  108/66 136/87 109/69  Pulse:  86 81 85  Resp:  18 20 16   Temp: (!) 102 F (38.9 C) 98.2 F (36.8 C) 98.2 F (36.8 C) 97.9 F (36.6 C)  TempSrc: Rectal Oral Oral Oral  SpO2:  97% 100% 95%  Weight:  56.3 kg (124 lb 1.9 oz)    Height:  5\' 3"  (  1.6 m)      Intake/Output Summary (Last 24 hours) at 11/15/16 1426 Last data filed at 11/15/16 1338  Gross per 24 hour  Intake           503.33 ml  Output              725 ml  Net          -221.67 ml   Filed Weights   11/15/16 0259  Weight: 56.3 kg (124 lb 1.9 oz)   General: Alert, Awake and Oriented to Time, Place and  Person. Appear in mild distress, affect appropriate Eyes: PERRL, Conjunctiva normal ENT: Oral Mucosa clear moist. Neck: no JVD, no Abnormal Mass Or lumps Cardiovascular: S1 and S2 Present, no Murmur, Peripheral Pulses Present Respiratory: normal respiratory effort, Bilateral Air entry equal and Decreased, no use of accessory muscle, Clear to Auscultation, no Crackles, no wheezes Abdomen: Bowel Sound present, Soft and mild tenderness,  Skin: no redness, no Rash, no induration Extremities: no Pedal edema, no calf tenderness Neurologic: Grossly no focal neuro deficit Chronic paraplegia and spasticity  Data Reviewed: CBC:  Recent Labs Lab 11/09/16 1830 11/14/16 2130 11/15/16 0456  WBC 8.8 7.6 8.1  NEUTROABS  --  6.7  --   HGB 9.7* 9.3* 9.5*  HCT 32.3* 30.5* 31.1*  MCV 73.4* 70.9* 71.5*  PLT 203 193 532   Basic Metabolic Panel:  Recent Labs Lab 11/09/16 1805 11/14/16 2130 11/15/16 0456  NA 135 137 137  K 3.6 2.6* 2.6*  CL 103 100* 100*  CO2 22 28 28   GLUCOSE 104* 121* 116*  BUN 7 6 <5*  CREATININE 0.36* 0.35* 0.35*  CALCIUM 8.4* 8.3* 7.9*  MG  --  1.8 1.7  PHOS  --   --  1.8*    Liver Function Tests:  Recent Labs Lab 11/09/16 1805 11/14/16 2130 11/15/16 0456  AST 18 10* 11*  ALT 13* 9* 9*  ALKPHOS 98 87 85  BILITOT 0.8 0.5 0.7  PROT 6.9 6.1* 6.1*  ALBUMIN 3.7 3.3* 3.3*   No results for input(s): LIPASE, AMYLASE in the last 168 hours. No results for input(s): AMMONIA in the last 168 hours. Coagulation Profile:  Recent Labs Lab 11/15/16 0456  INR 1.14   Cardiac Enzymes: No results for input(s): CKTOTAL, CKMB, CKMBINDEX, TROPONINI in the last 168 hours. BNP (last 3 results) No results for input(s): PROBNP in the last 8760 hours. CBG: No results for input(s): GLUCAP in the last 168 hours. Studies: Ct Abdomen Pelvis Wo Contrast  Result Date: 11/15/2016 CLINICAL DATA:  Acute onset of generalized abdominal pain. Initial encounter. EXAM: CT ABDOMEN AND  PELVIS WITHOUT CONTRAST TECHNIQUE: Multidetector CT imaging of the abdomen and pelvis was performed following the standard protocol without IV contrast. COMPARISON:  CT of the abdomen and pelvis performed 09/27/2014, and MRI of the pelvis performed 02/15/2016 FINDINGS: Lower chest: Minimal bibasilar atelectasis or scarring is noted. Trace pericardial fluid remains within normal limits. Hepatobiliary: The liver is unremarkable in appearance. The patient is status post cholecystectomy, with clips noted at the gallbladder fossa. The common bile duct remains normal in caliber. Pancreas: The pancreas is within normal limits. Spleen: The spleen is unremarkable in appearance. Adrenals/Urinary Tract: The adrenal glands are unremarkable in appearance. Minimal left-sided hydronephrosis is noted, extending to the level of the patient's ileal conduit, grossly stable from the prior study. No obstructing stone is seen. The kidneys are otherwise unremarkable. No renal or ureteral stones are identified. No perinephric stranding  is appreciated. Stomach/Bowel: The stomach is unremarkable in appearance. The small bowel is within normal limits. The appendix is normal in caliber, without evidence of appendicitis. Mild wall thickening is suggested along the distal sigmoid colon, which may reflect an acute infectious or inflammatory process. Vascular/Lymphatic: The abdominal aorta is unremarkable in appearance. The inferior vena cava is grossly unremarkable. No retroperitoneal lymphadenopathy is seen. No pelvic sidewall lymphadenopathy is identified. Reproductive: The uterus is grossly unremarkable in appearance. A 6.0 x 4.8 cm thick walled fluid collection inferior to the uterus is thought to reflect the bladder, given its appearance on sagittal images. The right ovary is unremarkable in appearance. A 2.2 cm left ovarian follicle is noted. Other: No additional soft tissue abnormalities are seen. Musculoskeletal: Sclerosis at the left  posterior acetabulum and inferior pubic ramus reflects previously noted osteomyelitis, with an overlying prominent soft tissue defect extending to the bone. The visualized musculature is unremarkable in appearance. IMPRESSION: 1. Mild wall thickening suggested along the distal sigmoid colon, which may reflect an acute infectious or inflammatory process. 2. Thick-walled appearance to an apparent bladder, raising question for cystitis, though this is relatively stable from the prior study. 3. Ileal conduit is unremarkable in appearance. Minimal chronic left-sided hydronephrosis is stable from the prior study. 4. Prominent decubitus ulceration noted extending to the bone at the left posterior acetabulum and inferior pubic ramus, with associated sclerosis reflecting previously noted osteomyelitis. Electronically Signed   By: Garald Balding M.D.   On: 11/15/2016 02:26   Dg Chest 2 View  Result Date: 11/14/2016 CLINICAL DATA:  Sepsis.  Fever. EXAM: CHEST  2 VIEW COMPARISON:  07/25/2016 FINDINGS: The cardiomediastinal contours are normal. The lungs are clear. Pulmonary vasculature is normal. No consolidation, pleural effusion, or pneumothorax. No acute osseous abnormalities are seen. IMPRESSION: Clear lungs. Electronically Signed   By: Jeb Levering M.D.   On: 11/14/2016 22:44    Scheduled Meds: . baclofen  20 mg Oral TID  . chlorhexidine  15 mL Mouth Rinse BID  . diazepam  10 mg Oral QHS  . diazepam  5 mg Oral 2 times per day  . gabapentin  800 mg Oral TID  . lamoTRIgine  125 mg Oral Daily  . loratadine  5 mg Oral Daily  . mouth rinse  15 mL Mouth Rinse q12n4p  . sertraline  100 mg Oral Daily   Continuous Infusions: . linezolid (ZYVOX) IV Stopped (11/15/16 1341)  . piperacillin-tazobactam (ZOSYN)  IV Stopped (11/15/16 0900)   PRN Meds: acetaminophen **OR** acetaminophen, HYDROcodone-acetaminophen, ondansetron **OR** ondansetron (ZOFRAN) IV  Time spent: 35 minutes  Author: Berle Mull,  MD [facility] Pager: [phone] 11/15/2016 2:26 PM  If 7PM-7AM, please contact night-coverage at [email or web address], password [employer] [facility] [facility] [employer] [facility]

## 2016-11-15 NOTE — Progress Notes (Signed)
IV potassium runs delayed d/t pt's intolerance c/o burning.

## 2016-11-15 NOTE — Progress Notes (Signed)
RN can call report at 00:30 to [place], [place] at [phone]

## 2016-11-15 NOTE — Progress Notes (Signed)
CRITICAL VALUE ALERT  Critical Value:  K 2.6  Date & Time Notied:  0630  Provider Notified: [phone]  Orders Received/Actions taken: N/a

## 2016-11-15 NOTE — Progress Notes (Signed)
EEG report delayed.  Computer file appears corrupt.  Will contact vendor for tech support in AM. Reading neurologist unable to access computer file.

## 2016-11-15 NOTE — Progress Notes (Signed)
Offsite EEG completed at [facility], results pending. 

## 2016-11-15 NOTE — Care Management Note (Signed)
Case Management Note  Patient Details  Name: [name] MRN: [id number] Date of Birth: 08-21-1980  Subjective/Objective:35 y/o f admitted w/Sepsis. From home. Active w/[facility] HHRN/CSW-rep Brad aware. Hx: paraplegia, PTSD. Readmit.  If recc- resume [place] social work.                 Action/Plan:d/c plan home w/HHC.   Expected Discharge Date:                Expected Discharge Plan:  [place]  In-House Referral:     Discharge planning Services  CM Consult  Post Acute Care Choice:  Home Health (Active w/[facility] HHRN/CSW) Choice offered to:     DME Arranged:    DME Agency:     HH Arranged:    HH Agency:     Status of Service:  In process, will continue to follow  If discussed at Long Length of Stay Meetings, dates discussed:    Additional Comments:  Dessa Phi, RN 11/15/2016, 2:55 PM

## 2016-11-15 NOTE — Consult Note (Signed)
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 Patient uses NPWT at home with the assistance of an [facility] ([place]) who comes three times weekly and PRN for wound care needs. Wound type:Pressure Pressure Injury POA: Yes Measurement: 3cm x 2.5cm x 5cm  Wound bed: Red, moist, free of necrotic tissue Drainage (amount, consistency, odor) Small amount of serous exudate, no odor Periwound:Intact, dry. No periwound erythema, induration, warmth or fluctuance. Dressing procedure/placement/frequency: While patient uses NPWT at home, we will dress this chronic wound twice daily and PRN using saline moist gauze.  Turning and repositioning is in place.  The bilateral heels are intact, but at risk, so we will provide bilateral pressure redistribution heel boots (Prevalon). [place] nursing team will not follow, but will remain available to this patient, the nursing and medical teams.  Please re-consult if needed. Thanks, Maudie Flakes, MSN, RN, [place], Arther Abbott  Pager# [phone]

## 2016-11-15 NOTE — Progress Notes (Signed)
PHARMACY - PHYSICIAN COMMUNICATION CRITICAL VALUE ALERT - BLOOD CULTURE IDENTIFICATION (BCID)  Results for orders placed or performed during the hospital encounter of 11/14/16  Blood Culture ID Panel (Reflexed) (Collected: 11/14/2016  9:30 PM)  Result Value Ref Range   Enterococcus species NOT DETECTED NOT DETECTED   Vancomycin resistance NOT DETECTED NOT DETECTED   Listeria monocytogenes NOT DETECTED NOT DETECTED   Staphylococcus species DETECTED (A) NOT DETECTED   Staphylococcus aureus NOT DETECTED NOT DETECTED   Methicillin resistance NOT DETECTED NOT DETECTED   Streptococcus species NOT DETECTED NOT DETECTED   Streptococcus agalactiae NOT DETECTED NOT DETECTED   Streptococcus pneumoniae NOT DETECTED NOT DETECTED   Streptococcus pyogenes NOT DETECTED NOT DETECTED   Acinetobacter baumannii NOT DETECTED NOT DETECTED   Enterobacteriaceae species NOT DETECTED NOT DETECTED   Enterobacter cloacae complex NOT DETECTED NOT DETECTED   Escherichia coli NOT DETECTED NOT DETECTED   Klebsiella oxytoca NOT DETECTED NOT DETECTED   Klebsiella pneumoniae NOT DETECTED NOT DETECTED   Proteus species NOT DETECTED NOT DETECTED   Serratia marcescens NOT DETECTED NOT DETECTED   Carbapenem resistance NOT DETECTED NOT DETECTED   Haemophilus influenzae NOT DETECTED NOT DETECTED   Neisseria meningitidis NOT DETECTED NOT DETECTED   Pseudomonas aeruginosa NOT DETECTED NOT DETECTED   Candida albicans NOT DETECTED NOT DETECTED   Candida glabrata NOT DETECTED NOT DETECTED   Candida krusei NOT DETECTED NOT DETECTED   Candida parapsilosis NOT DETECTED NOT DETECTED   Candida tropicalis NOT DETECTED NOT DETECTED    Name of physician (or Provider) Contacted: Dr. Posey Pronto  Changes to prescribed antibiotics required: cont zyvox  Lynelle Doctor 11/15/2016  5:41 PM

## 2016-11-15 NOTE — Progress Notes (Signed)
Pharmacy Antibiotic Note  [name] is a 36 y.o. female admitted on 11/14/2016 with sepsis.  Pharmacy has been consulted for zosn dosing.  Plan: Zosyn 3.375g IV q8h (4 hour infusion).  Height: 5\' 3"  (160 cm) Weight: 124 lb 1.9 oz (56.3 kg) IBW/kg (Calculated) : 52.4  Temp (24hrs), Avg:100.1 F (37.8 C), Min:98.2 F (36.8 C), Max:102 F (38.9 C)   Recent Labs Lab 11/09/16 1805 11/09/16 1830 11/09/16 1833 11/14/16 2130 11/14/16 2147  WBC  --  8.8  --  7.6  --   CREATININE 0.36*  --   --  0.35*  --   LATICACIDVEN  --   --  1.3  --  1.12    Estimated Creatinine Clearance: 81.2 mL/min (A) (by C-G formula based on SCr of 0.35 mg/dL (L)).    Allergies  Allergen Reactions  . Iodine Anaphylaxis  . Latex Anaphylaxis  . Other Anaphylaxis  . Shellfish-Derived Products Anaphylaxis    Reaction unknown  . Vancomycin Anaphylaxis  . Aspirin Other (See Comments)    Reaction unknown  . Strawberry Extract Hives and Rash    Antimicrobials this admission: Zosyn 11/15/2016 >> Zyvox 8/16 >>  Dose adjustments this admission:   Microbiology results: pending  Thank you for allowing pharmacy to be a part of this patient's care.  Nani Skillern Crowford 11/15/2016 3:13 AM

## 2016-11-15 NOTE — Progress Notes (Signed)
Pt appeared to be in a daze for no more than 5 seconds. Pt was reoriented by calling her name. Family at bedside and witnessed this episode as well as this Therapist, sports. Mother at bedside believes pt is having a reaction to potassium. Dr. Hal Hope notified. VSS. Will continue to monitor pt closely.

## 2016-11-16 DIAGNOSIS — R4182 Altered mental status, unspecified: Secondary | ICD-10-CM

## 2016-11-16 LAB — CBC
HCT: 28.8 % — ABNORMAL LOW (ref 36.0–46.0)
Hemoglobin: 8.7 g/dL — ABNORMAL LOW (ref 12.0–15.0)
MCH: 22.2 pg — AB (ref 26.0–34.0)
MCHC: 30.2 g/dL (ref 30.0–36.0)
MCV: 73.5 fL — ABNORMAL LOW (ref 78.0–100.0)
PLATELETS: 194 10*3/uL (ref 150–400)
RBC: 3.92 MIL/uL (ref 3.87–5.11)
RDW: 18.6 % — ABNORMAL HIGH (ref 11.5–15.5)
WBC: 4.2 10*3/uL (ref 4.0–10.5)

## 2016-11-16 LAB — BASIC METABOLIC PANEL
Anion gap: 7 (ref 5–15)
CHLORIDE: 107 mmol/L (ref 101–111)
CO2: 26 mmol/L (ref 22–32)
Calcium: 8.1 mg/dL — ABNORMAL LOW (ref 8.9–10.3)
Glucose, Bld: 84 mg/dL (ref 65–99)
Potassium: 3.6 mmol/L (ref 3.5–5.1)
SODIUM: 140 mmol/L (ref 135–145)

## 2016-11-16 LAB — MAGNESIUM: MAGNESIUM: 1.8 mg/dL (ref 1.7–2.4)

## 2016-11-16 NOTE — Progress Notes (Signed)
[facility] Progress Note  Patient: [name] JQZ:[id number]   PCP: Lennie Odor, PA-C DOB: 01-31-1981   DOA: 11/14/2016   DOS: 11/16/2016   Date of Service: the patient was seen and examined on 11/16/2016  Subjective: Feeling better. No more fever. Nausea resolved. No vomiting here as well. Diarrhea also improved.  Brief hospital course: Pt. with PMH of transverse myelitis, paraplegia, decubitus ulcer, ileal conduit creation with recurrent UTI, GERD; admitted on 11/14/2016, presented with complaint of fever and nausea vomiting, was found to have sigmoid colitis as well as possible cystitis. Currently further plan is continue IV antibiotics.  Assessment and Plan: 1. Sigmoid colitis. Presented with complaints of nausea vomiting and fever. Currently symptoms are almost resolved. CT scan shows evidence of distal sigmoid colon on thickening concerning for infectious process. Patient started on IV Zosyn which I would continue. Blood cultures performed, So far negative. Monitor the results.  2. Possible cystitis. Fluid collection underneath the uterus appears to be old bladder with thick wall concerning for cystitis. Patient is already on IV antibiotics will monitor the results of the blood cultures. Urine culture positive for gram-negative rods. Follow-up on the results.  3. History of osteomyelitis. Chronic stage IV sacral decubitus ulcer. Appreciate wound care assistance. There was some concern for possible osteomyelitis although the CT scan shows no significant changes and the wound care RN feels that the wound does not appear infected as well. Patient was started on broad-spectrum IV Zyvox as well as IV Zosyn. ESR normal, CRP elevated although as per my discussion with ID since the wound does not appear infected no further indication for covering osteomyelitis. We will discontinue Zyvox. Just treat with Zosyn versus Augmentin depending on cultures.  4. Transverse  myelitis. Chronic paraplegia. Spasticity. Continuing home regimen without any changes.  5. Staring spells. Concern for possible seizures. MRI brain currently pending EEG results currently pending. No further events so far. We'll advance her diet to clear liquid diet.  6. Hypokalemia. We'll replace orally. Recheck in the afternoon.  7. Iron deficiency anemia. We'll require iron replacement as well. As well as B-12 replacement.  8. Low TSH. T4 mildly elevated, recommend a recheck in 1-2 weeks once patient is better from acute illness.  Diet: Soft diet DVT Prophylaxis: subcutaneous Heparin  Advance goals of care discussion: full code  Family Communication: family was present at bedside, at the time of interview. The pt provided permission to discuss medical plan with the family. Opportunity was given to ask question and all questions were answered satisfactorily.   Disposition:  Discharge to home.  Consultants: none Procedures: none  Antibiotics: Anti-infectives    Start     Dose/Rate Route Frequency Ordered Stop   11/15/16 0600  piperacillin-tazobactam (ZOSYN) IVPB 3.375 g     3.375 g 12.5 mL/hr over 240 Minutes Intravenous Every 8 hours 11/15/16 0312     11/15/16 0000  linezolid (ZYVOX) IVPB 600 mg  Status:  Discontinued     600 mg 300 mL/hr over 60 Minutes Intravenous Every 12 hours 11/14/16 2347 11/16/16 1225   11/14/16 2345  piperacillin-tazobactam (ZOSYN) IVPB 3.375 g     3.375 g 100 mL/hr over 30 Minutes Intravenous  Once 11/14/16 2331 11/15/16 0245       Objective: Physical Exam: Vitals:   11/15/16 2050 11/15/16 2151 11/16/16 0550 11/16/16 1355  BP: (!) 91/53 103/61 101/60 98/61  Pulse: 75 78 77 71  Resp: _0 Temp: 97.9 F (36.6 C)  97.7 F (36.5 C) (!) 97.5 F (36.4 C)  TempSrc: Oral  Oral Oral  SpO2: 95%  94% 100%  Weight:      Height:        Intake/Output Summary (Last 24 hours) at 11/16/16 1512 Last data filed at 11/16/16 1401   Gross per 24 hour  Intake             1255 ml  Output              900 ml  Net              355 ml   Filed Weights   11/15/16 0259  Weight: 56.3 kg (124 lb 1.9 oz)   General: Alert, Awake and Oriented to Time, Place and Person. Appear in mild distress, affect appropriate Eyes: PERRL, Conjunctiva normal ENT: Oral Mucosa clear moist. Neck: no JVD, no Abnormal Mass Or lumps Cardiovascular: S1 and S2 Present, no Murmur, Peripheral Pulses Present Respiratory: normal respiratory effort, Bilateral Air entry equal and Decreased, no use of accessory muscle, Clear to Auscultation, no Crackles, no wheezes Abdomen: Bowel Sound present, Soft and mild tenderness,  Skin: no redness, no Rash, no induration Extremities: no Pedal edema, no calf tenderness Neurologic: Grossly no focal neuro deficit Chronic paraplegia and spasticity  Data Reviewed: CBC:  Recent Labs Lab 11/09/16 1830 11/14/16 2130 11/15/16 0456 11/16/16 0739  WBC 8.8 7.6 8.1 4.2  NEUTROABS  --  6.7  --   --   HGB 9.7* 9.3* 9.5* 8.7*  HCT 32.3* 30.5* 31.1* 28.8*  MCV 73.4* 70.9* 71.5* 73.5*  PLT 203 193 218 660   Basic Metabolic Panel:  Recent Labs Lab 11/09/16 1805 11/14/16 2130 11/15/16 0456 11/15/16 1624 11/16/16 0739  NA 135 137 137 140 140  K 3.6 2.6* 2.6* 4.2 3.6  CL 103 100* 100* 108 107  CO2 _0 GLUCOSE 104* 121* 116* 87 84  BUN 7 6 <5* <5* <5*  CREATININE 0.36* 0.35* 0.35* <0.30* <0.30*  CALCIUM 8.4* 8.3* 7.9* 8.1* 8.1*  MG  --  1.8 1.7  --  1.8  PHOS  --   --  1.8*  --   --     Liver Function Tests:  Recent Labs Lab 11/09/16 1805 11/14/16 2130 11/15/16 0456  AST 18 10* 11*  ALT 13* 9* 9*  ALKPHOS 98 87 85  BILITOT 0.8 0.5 0.7  PROT 6.9 6.1* 6.1*  ALBUMIN 3.7 3.3* 3.3*   No results for input(s): LIPASE, AMYLASE in the last 168 hours. No results for input(s): AMMONIA in the last 168 hours. Coagulation Profile:  Recent Labs Lab 11/15/16 0456  INR 1.14   Cardiac  Enzymes: No results for input(s): CKTOTAL, CKMB, CKMBINDEX, TROPONINI in the last 168 hours. BNP (last 3 results) No results for input(s): PROBNP in the last 8760 hours. CBG: No results for input(s): GLUCAP in the last 168 hours. Studies: No results found.  Scheduled Meds: . baclofen  20 mg Oral TID  . chlorhexidine  15 mL Mouth Rinse BID  . diazepam  10 mg Oral QHS  . diazepam  5 mg Oral 2 times per day  . gabapentin  800 mg Oral TID  . lamoTRIgine  125 mg Oral Daily  . loratadine  5 mg Oral Daily  . mouth rinse  15 mL Mouth Rinse q12n4p  . sertraline  100 mg Oral Daily   Continuous Infusions: . piperacillin-tazobactam (ZOSYN)  IV Stopped (11/16/16 0950)  PRN Meds: acetaminophen **OR** acetaminophen, HYDROcodone-acetaminophen, ondansetron **OR** ondansetron (ZOFRAN) IV  Time spent: 35 minutes  Author: Berle Mull, MD [facility] Pager: [phone] 11/16/2016 3:12 PM  If 7PM-7AM, please contact night-coverage at [email or web address], password [facility]

## 2016-11-16 NOTE — Procedures (Signed)
Date of recording 11/15/2016  Referring physician Arshad  Reason for the study 36 y.o. 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 Pt was reoriented by calling her name.  ? seizure. Now pt back to baseline.  Technical Digital EEG recording using 10-20 international electrode system  Description of the recording EEG comprised of generalized beta activity which is usually medication benzodiazepine effect. Posterior background was also obscured with Beta activity. At times it was visible and was up to 9 Hz symmetrical. Sleep was not obtained No localizing, lateralizing, interictal epileptiform features were seen during this recording.  Impression EEG is normal with patient a coronary awake state only.

## 2016-11-16 NOTE — Care Management Note (Signed)
Case Management Note  Patient Details  Name: [name] MRN: [id number] Date of Birth: 1980-07-05  Subjective/Objective: 36 y/o f admitted w/Sepsis. From home w/caregiver. Active w/[facility] HHRN/CSW rep Brenton Grills already aware-await resumption HHC orders. Patient has [facility] [employer] aware & following-patient states she has all services needed, & knows to f/u w/[facility] for additional services(meds,resources).Has own transp home.                   Action/Plan:d/c plan home w/HHC.   Expected Discharge Date:  11/18/16               Expected Discharge Plan:  [place]  In-House Referral:     Discharge planning Services  CM Consult  Post Acute Care Choice:  Home Health (Active w/[facility] HHRN/CSW) Choice offered to:     DME Arranged:    DME Agency:     HH Arranged:    HH Agency:     Status of Service:  In process, will continue to follow  If discussed at Long Length of Stay Meetings, dates discussed:    Additional Comments:  Dessa Phi, RN 11/16/2016, 12:32 PM

## 2016-11-17 LAB — BASIC METABOLIC PANEL
ANION GAP: 8 (ref 5–15)
CHLORIDE: 105 mmol/L (ref 101–111)
CO2: 27 mmol/L (ref 22–32)
Calcium: 8.2 mg/dL — ABNORMAL LOW (ref 8.9–10.3)
Creatinine, Ser: <0.3 mg/dL — ABNORMAL LOW (ref 0.44–1.00)
Glucose, Bld: 95 mg/dL (ref 65–99)
POTASSIUM: 3.3 mmol/L — AB (ref 3.5–5.1)
SODIUM: 140 mmol/L (ref 135–145)

## 2016-11-17 LAB — CBC
HCT: 28.8 % — ABNORMAL LOW (ref 36.0–46.0)
HEMOGLOBIN: 8.8 g/dL — AB (ref 12.0–15.0)
MCH: 22.5 pg — AB (ref 26.0–34.0)
MCHC: 30.6 g/dL (ref 30.0–36.0)
MCV: 73.7 fL — AB (ref 78.0–100.0)
PLATELETS: 227 10*3/uL (ref 150–400)
RBC: 3.91 MIL/uL (ref 3.87–5.11)
RDW: 18.6 % — ABNORMAL HIGH (ref 11.5–15.5)
WBC: 4.3 10*3/uL (ref 4.0–10.5)

## 2016-11-17 MED ORDER — AMOXICILLIN-POT CLAVULANATE 500-125 MG PO TABS: 1.0000 | ORAL_TABLET | Freq: Three times a day (TID) | ORAL | 0 refills | Status: AC

## 2016-11-17 MED ORDER — SACCHAROMYCES BOULARDII 250 MG PO CAPS
250.0000 mg | ORAL_CAPSULE | Freq: Two times a day (BID) | ORAL | Status: DC
  Administered 2016-11-17: 250 mg via ORAL
  Filled 2016-11-17: qty 1

## 2016-11-17 MED ORDER — POLYETHYLENE GLYCOL 3350 17 G PO PACK: 17.0000 g | PACK | Freq: Every day | ORAL | 0 refills | Status: DC | PRN

## 2016-11-17 MED ORDER — AMOXICILLIN-POT CLAVULANATE 500-125 MG PO TABS
1.0000 | ORAL_TABLET | Freq: Three times a day (TID) | ORAL | Status: DC
  Administered 2016-11-17: 500 mg via ORAL
  Filled 2016-11-17 (×2): qty 1

## 2016-11-17 MED ORDER — SACCHAROMYCES BOULARDII 250 MG PO CAPS: 250.0000 mg | ORAL_CAPSULE | Freq: Two times a day (BID) | ORAL | 0 refills | Status: DC

## 2016-11-17 NOTE — Discharge Instructions (Signed)
Soft-Food Meal Plan °A soft-food meal plan includes foods that are safe and easy to swallow. This meal plan typically is used: °· If you are having trouble chewing or swallowing foods. °· As a transition meal plan after only having had liquid meals for a long period. ° °What do I need to know about the soft-food meal plan? °A soft-food meal plan includes tender foods that are soft and easy to chew and swallow. In most cases, bite-sized pieces of food are easier to swallow. A bite-sized piece is about ½ inch or smaller. Foods in this plan do not need to be ground or pureed. °Foods that are very hard, crunchy, or sticky should be avoided. Also, breads, cereals, yogurts, and desserts with nuts, seeds, or fruits should be avoided. °What foods can I eat? °Grains °Rice and wild rice. Moist bread, dressing, pasta, and noodles. Well-moistened dry or cooked cereals, such as farina (cooked wheat cereal), oatmeal, or grits. Biscuits, breads, muffins, pancakes, and waffles that have been well moistened. °Vegetables °Shredded lettuce. Cooked, tender vegetables, including potatoes without skins. Vegetable juices. Broths or creamed soups made with vegetables that are not stringy or chewy. Strained tomatoes (without seeds). °Fruits °Canned or well-cooked fruits. Soft (ripe), peeled fresh fruits, such as peaches, nectarines, kiwi, cantaloupe, honeydew melon, and watermelon (without seeds). Soft berries with small seeds, such as strawberries. Fruit juices (without pulp). °Meats and Other Protein Sources °Moist, tender, lean beef. Mutton. Lamb. Veal. Chicken. Turkey. Liver. Ham. Fish without bones. Eggs. °Dairy °Milk, milk drinks, and cream. Plain cream cheese and cottage cheese. Plain yogurt. °Sweets/Desserts °Flavored gelatin desserts. Custard. Plain ice cream, frozen yogurt, sherbet, milk shakes, and malts. Plain cakes and cookies. Plain hard candy. °Other °Butter, margarine (without trans fat), and cooking oils. Mayonnaise. Cream  sauces. Mild spices, salt, and sugar. Syrup, molasses, honey, and jelly. °The items listed above may not be a complete list of recommended foods or beverages. Contact your dietitian for more options. °What foods are not recommended? °Grains °Dry bread, toast, crackers that have not been moistened. Coarse or dry cereals, such as bran, granola, and shredded wheat. Tough or chewy crusty breads, such as French bread or baguettes. °Vegetables °Corn. Raw vegetables except shredded lettuce. Cooked vegetables that are tough or stringy. Tough, crisp, fried potatoes and potato skins. °Fruits °Fresh fruits with skins or seeds or both, such as apples, pears, or grapes. Stringy, high-pulp fruits, such as papaya, pineapple, coconut, or mango. Fruit leather, fruit roll-ups, and all dried fruits. °Meats and Other Protein Sources °Sausages and hot dogs. Meats with gristle. Fish with bones. Nuts, seeds, and chunky peanut or other nut butters. °Sweets/Desserts °Cakes or cookies that are very dry or chewy. °The items listed above may not be a complete list of foods and beverages to avoid. Contact your dietitian for more information. °This information is not intended to replace advice given to you by your health care provider. Make sure you discuss any questions you have with your health care provider. °Document Released: 06/26/2007 Document Revised: 08/25/2015 Document Reviewed: 02/13/2013 °Elsevier Interactive Patient Education © 2017 Elsevier Inc. ° °

## 2016-11-18 LAB — URINE CULTURE

## 2016-11-18 LAB — CULTURE, BLOOD (ROUTINE X 2): SPECIAL REQUESTS: ADEQUATE

## 2016-11-19 LAB — AEROBIC CULTURE W GRAM STAIN (SUPERFICIAL SPECIMEN)

## 2016-11-19 LAB — AEROBIC CULTURE  (SUPERFICIAL SPECIMEN)

## 2016-11-19 LAB — CULTURE, BLOOD (ROUTINE X 2)
Culture: NO GROWTH
SPECIAL REQUESTS: ADEQUATE

## 2016-11-26 NOTE — Discharge Summary (Signed)
[facility] Discharge Summary   Patient: [name] ZOX:[id number]   PCP: Lennie Odor, PA-C DOB: 12-Mar-1981   Date of admission: 11/14/2016   Date of discharge: 11/17/2016     Discharge Diagnoses:  Active Problems:   Multiple sclerosis (HCC)   History of transverse myelitis   Hypokalemia   Decubitus ulcer of buttock, stage 4 (HCC)   Sepsis ([place])   Dehydration   Decubitus ulcer   Admitted From: home Disposition:  Home with home health  Recommendations for Outpatient Follow-up:  1. Please follow up with PCP and continue wound care.   Follow-up Information    [facility].   Why:  [employer] nuursing/social Web designer information: [address] [place] [address] [phone]        Lennie Odor, [place]. Schedule an appointment as soon as possible for a visit in 1 week(s).   Specialty:  Nurse Practitioner Contact information: [address] [place] [address] [phone]          Diet recommendation: regular diet  Activity: The patient is advised to gradually reintroduce usual activities.  Discharge Condition: good  Code Status: full code  History of present illness: As per the H and P dictated on admission, "[name] is a 36 y.o. 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 She reports dark urine she has chronic stage IV decubitus ulcer but states it's been currently doing better patient have had some diffuse fatigue patient endorses vague abdominal discomfort    Recently seen at [facility] hospital for 3 week history of vaginal bleeding that is currently resolved.    Regarding pertinent Chronic problems: Diagnosed with acute transverse myelitis since 2005. She is paraplegic, unable to move below the waist. She has both bowel and urinary retention. She has right greater than left upper extremity weakness. "  Hospital Course:  Summary of her active problems in the hospital is as following. 1. Sigmoid colitis. Presented with complaints of nausea vomiting and fever. Currently symptoms are almost resolved. CT scan shows evidence of distal sigmoid colon on thickening concerning for infectious process. Patient started on IV Zosyn. Now on augmentin Blood cultures performed, So far negative.  2. Possible cystitis. Fluid collection underneath the uterus appears to be old bladder with thick wall concerning for cystitis. Patient is already on IV antibiotics will monitor the results of the blood cultures. Urine culture positive for klebsiella pneumonie, sensitive to augmentin  3. History of osteomyelitis. Chronic stage IV sacral decubitus ulcer. Appreciate wound care assistance. There was some concern for possible osteomyelitis although the CT scan shows no significant changes and the wound care RN feels that the wound does not appear infected as well. Patient was started on broad-spectrum IV Zyvox as well as IV Zosyn. ESR normal, CRP elevated although as per my discussion with ID since the wound does not appear infected no further indication for covering osteomyelitis. We will discontinue Zyvox. Continue Augmentin. MRSA in wound likely colonizer vs contaminat as it was superficial culture and since wound did not look infected, as per my discussion with ID.  4. Transverse myelitis. Chronic paraplegia. Spasticity. Continuing home  regimen without any changes.  5. Staring spells. Concern for possible seizures. MRI brain currently negative for any acoute abnormality  EEG results normal No further events so far.  6. Hypokalemia. Replaced   7. Iron  deficiency anemia. Will require iron replacement as well. As well as B-12 replacement.  8. Low TSH. T4 mildly elevated, recommend a recheck in 1-2 weeks once patient is better from acute illness.  All other chronic medical condition were stable during the hospitalization.  Patient was active with home health, which was arranged by Education officer, museum and case Freight forwarder. On the day of the discharge the patient's vitals were stable, and no other acute medical condition were reported by patient. the patient was felt safe to be discharge at home  with home health.  Procedures and Results:  Eeg   Consultations:  none  DISCHARGE MEDICATION: Discharge Medication List as of 11/17/2016  1:43 PM    START taking these medications   Details  amoxicillin-clavulanate (AUGMENTIN) 500-125 MG tablet Take 1 tablet (500 mg total) by mouth 3 (three) times daily with meals., Starting Sat 11/17/2016, Until Tue 11/20/2016, Normal    polyethylene glycol (MIRALAX) packet Take 17 g by mouth daily as needed., Starting Sat 11/17/2016, Normal    saccharomyces boulardii (FLORASTOR) 250 MG capsule Take 1 capsule (250 mg total) by mouth 2 (two) times daily., Starting Sat 11/17/2016, Normal      CONTINUE these medications which have NOT CHANGED   Details  acetaminophen (TYLENOL) 325 MG tablet Take 650 mg by mouth every 6 (six) hours as needed for fever., Historical Med    baclofen (LIORESAL) 20 MG tablet Take 20 mg by mouth 3 (three) times daily., Historical Med    diazepam (VALIUM) 5 MG tablet Take 1-2 tablets (5-10 mg total) by mouth every 8 (eight) hours. Take 1 tablet in AM, 1 tablet in PM, and 2 tablets at bedtime, Starting Thu 04/02/2013, Print    fluticasone (FLONASE) 50 MCG/ACT nasal spray Place 2 sprays into the nose daily as needed for allergies. , Historical Med    gabapentin (NEURONTIN) 400 MG capsule Take 2 capsules (800 mg total) by mouth 3 (three) times daily., Starting Mon 08/13/2016, Normal    ibuprofen  (ADVIL,MOTRIN) 200 MG tablet Take 200 mg by mouth every 6 (six) hours as needed for moderate pain., Historical Med    lamoTRIgine (LAMICTAL) 25 MG tablet Take 125 mg by mouth daily., Historical Med    Loperamide HCl (IMODIUM A-D PO) Take 1 tablet by mouth daily as needed (diarrhea). , Historical Med    loratadine (CLARITIN) 5 MG/5ML syrup Take 5 mg by mouth daily., Historical Med    lubiprostone (AMITIZA) 24 MCG capsule Take 24 mcg by mouth 2 (two) times daily with a meal., Historical Med    omeprazole (PRILOSEC) 40 MG capsule Take 40 mg by mouth daily., Historical Med    sertraline (ZOLOFT) 100 MG tablet Take 100 mg by mouth daily., Historical Med      STOP taking these medications     megestrol (MEGACE) 40 MG tablet        Allergies  Allergen Reactions  . Iodine Anaphylaxis  . Latex Anaphylaxis  . Other Anaphylaxis  . Shellfish-Derived Products Anaphylaxis    Reaction unknown  . Vancomycin Anaphylaxis  . Aspirin Other (See Comments)    Reaction unknown  . Strawberry Extract Hives and Rash   Discharge Instructions    DIET SOFT    Complete by:  As directed  Diet general    Complete by:  As directed    Discharge instructions    Complete by:  As directed    It is important that you read following instructions as well as go over your medication list with RN to help you understand your care after this hospitalization.  Discharge Instructions: Please follow-up with PCP in one week  Please request your primary care physician to go over all Hospital Tests and Procedure/Radiological results at the follow up,  Please get all Hospital records sent to your PCP by signing hospital release before you go home.   Do not take more than prescribed Pain, Sleep and Anxiety Medications. You were cared for by a hospitalist during your hospital stay. If you have any questions about your discharge medications or the care you received while you were in the hospital after you are  discharged, you can call the unit and ask to speak with the hospitalist on call if the hospitalist that took care of you is not available.  Once you are discharged, your primary care physician will handle any further medical issues. Please note that NO REFILLS for any discharge medications will be authorized once you are discharged, as it is imperative that you return to your primary care physician (or establish a relationship with a primary care physician if you do not have one) for your aftercare needs so that they can reassess your need for medications and monitor your lab values. You Must read complete instructions/literature along with all the possible adverse reactions/side effects for all the Medicines you take and that have been prescribed to you. Take any new Medicines after you have completely understood and accept all the possible adverse reactions/side effects. Wear Seat belts while driving. If you have smoked or chewed Tobacco in the last 2 yrs please stop smoking and/or stop any Recreational drug use.   Increase activity slowly    Complete by:  As directed      Discharge Exam: Filed Weights   11/15/16 0259  Weight: 56.3 kg (124 lb 1.9 oz)   Vitals:   11/16/16 2123 11/17/16 0507  BP: 97/60 (!) 98/56  Pulse: 76 78  Resp: 18 17  Temp: 97.7 F (36.5 C) 98.1 F (36.7 C)  SpO2: 100% 99%   General: Appear in no distress, no Rash; Oral Mucosa moist. Cardiovascular: S1 and S2 Present, no Murmur, no JVD Respiratory: Bilateral Air entry present and Clear to Auscultation, no Crackles, no wheezes Abdomen: Bowel Sound present, Soft and no tenderness Extremities: no Pedal edema, no calf tenderness Neurology: Grossly no focal neuro deficit. Chronic paraplegia  The results of significant diagnostics from this hospitalization (including imaging, microbiology, ancillary and laboratory) are listed below for reference.    Significant Diagnostic Studies: Ct Abdomen Pelvis Wo  Contrast  Result Date: 11/15/2016 CLINICAL DATA:  Acute onset of generalized abdominal pain. Initial encounter. EXAM: CT ABDOMEN AND PELVIS WITHOUT CONTRAST TECHNIQUE: Multidetector CT imaging of the abdomen and pelvis was performed following the standard protocol without IV contrast. COMPARISON:  CT of the abdomen and pelvis performed 09/27/2014, and MRI of the pelvis performed 02/15/2016 FINDINGS: Lower chest: Minimal bibasilar atelectasis or scarring is noted. Trace pericardial fluid remains within normal limits. Hepatobiliary: The liver is unremarkable in appearance. The patient is status post cholecystectomy, with clips noted at the gallbladder fossa. The common bile duct remains normal in caliber. Pancreas: The pancreas is within normal limits. Spleen: The spleen is unremarkable in appearance. Adrenals/Urinary Tract: The adrenal glands  are unremarkable in appearance. Minimal left-sided hydronephrosis is noted, extending to the level of the patient's ileal conduit, grossly stable from the prior study. No obstructing stone is seen. The kidneys are otherwise unremarkable. No renal or ureteral stones are identified. No perinephric stranding is appreciated. Stomach/Bowel: The stomach is unremarkable in appearance. The small bowel is within normal limits. The appendix is normal in caliber, without evidence of appendicitis. Mild wall thickening is suggested along the distal sigmoid colon, which may reflect an acute infectious or inflammatory process. Vascular/Lymphatic: The abdominal aorta is unremarkable in appearance. The inferior vena cava is grossly unremarkable. No retroperitoneal lymphadenopathy is seen. No pelvic sidewall lymphadenopathy is identified. Reproductive: The uterus is grossly unremarkable in appearance. A 6.0 x 4.8 cm thick walled fluid collection inferior to the uterus is thought to reflect the bladder, given its appearance on sagittal images. The right ovary is unremarkable in appearance. A 2.2  cm left ovarian follicle is noted. Other: No additional soft tissue abnormalities are seen. Musculoskeletal: Sclerosis at the left posterior acetabulum and inferior pubic ramus reflects previously noted osteomyelitis, with an overlying prominent soft tissue defect extending to the bone. The visualized musculature is unremarkable in appearance. IMPRESSION: 1. Mild wall thickening suggested along the distal sigmoid colon, which may reflect an acute infectious or inflammatory process. 2. Thick-walled appearance to an apparent bladder, raising question for cystitis, though this is relatively stable from the prior study. 3. Ileal conduit is unremarkable in appearance. Minimal chronic left-sided hydronephrosis is stable from the prior study. 4. Prominent decubitus ulceration noted extending to the bone at the left posterior acetabulum and inferior pubic ramus, with associated sclerosis reflecting previously noted osteomyelitis. Electronically Signed   By: Garald Balding M.D.   On: 11/15/2016 02:26   Dg Chest 2 View  Result Date: 11/14/2016 CLINICAL DATA:  Sepsis.  Fever. EXAM: CHEST  2 VIEW COMPARISON:  07/25/2016 FINDINGS: The cardiomediastinal contours are normal. The lungs are clear. Pulmonary vasculature is normal. No consolidation, pleural effusion, or pneumothorax. No acute osseous abnormalities are seen. IMPRESSION: Clear lungs. Electronically Signed   By: Jeb Levering M.D.   On: 11/14/2016 22:44   Mr Brain Wo Contrast  Result Date: 11/15/2016 CLINICAL DATA:  Altered level of consciousness, unexplained. Dehydration and hypokalemia. History of cervical myelopathy due to transverse myelitis. EXAM: MRI HEAD WITHOUT CONTRAST TECHNIQUE: Multiplanar, multiecho pulse sequences of the brain and surrounding structures were obtained without intravenous contrast. COMPARISON:  03/24/2015 FINDINGS: Brain: No acute or remote infarction, hemorrhage, hydrocephalus, extra-axial collection or mass lesion. History of  transverse myelitis; negative for demyelination. Mild generalized cerebral volume loss, stable. Vascular: Major flow voids are preserved. Skull and upper cervical spine: Negative for marrow lesion. Sinuses/Orbits: Large mucous retention cysts in the bilateral maxillary sinuses. No superimposed acute inflammation. Minimal mucosal thickening along the left mastoid septate. IMPRESSION: Stable brain MRI compared to 2016. No acute finding or evidence of demyelination. Electronically Signed   By: Monte Fantasia M.D.   On: 11/15/2016 15:11   US Pelvis Complete  Result Date: 11/09/2016 CLINICAL DATA:  Uterine bleeding for 3 weeks. Fever and nausea. LMP: 10/11/2016 EXAM: TRANSABDOMINAL ULTRASOUND OF PELVIS TECHNIQUE: Transabdominal ultrasound examination of the pelvis was performed including evaluation of the uterus, ovaries, adnexal regions, and pelvic cul-de-sac. COMPARISON:  Pelvic MRI 02/15/2016 Pelvic ultrasound 11/21/2011 FINDINGS: Uterus Measurements: 7.1 x 4.3 x 4.2 cm. 7 mm cyst within the uterine wall. Otherwise normal. Endometrium Thickness: 13.4 mm. There is a echogenic focus within the endometrial cavity  measuring 1.6 x 0.7 x 0.9 cm without demonstrable vascular flow. Right ovary Measurements: 3.1 x 1.9 x 2.0 cm. Normal appearance/no adnexal mass. Left ovary Measurements: 4.4 x 2.4 x 3.1 cm. There is a cyst measuring 2.9 x 1.9 x 2.3 cm. Other findings:  No abnormal free fluid. IMPRESSION: 1. Echogenic material in the endometrial cavity without demonstrable vascular flow. This may simply be blood within the endometrial cavity, but a hypovascular mass would be difficult to exclude based on this study alone. 2. Otherwise unremarkable pelvic ultrasound. Electronically Signed   By: Ulyses Jarred M.D.   On: 11/09/2016 20:08    Microbiology: No results found for this or any previous visit (from the past 240 hour(s)).   Time spent: 35 minutes  Signed:  PATEL, PRANAV  [facility] 11/17/2016   ,  10:19 AM

## 2016-11-27 ENCOUNTER — Telehealth: Payer: Self-pay | Admitting: Neurology

## 2016-11-27 NOTE — Telephone Encounter (Signed)
error 

## 2016-11-29 ENCOUNTER — Encounter (HOSPITAL_BASED_OUTPATIENT_CLINIC_OR_DEPARTMENT_OTHER): Payer: Medicaid Other | Attending: Internal Medicine

## 2016-11-29 DIAGNOSIS — L89324 Pressure ulcer of left buttock, stage 4: Secondary | ICD-10-CM | POA: Diagnosis present

## 2016-11-29 DIAGNOSIS — G35 Multiple sclerosis: Secondary | ICD-10-CM | POA: Diagnosis not present

## 2016-11-29 DIAGNOSIS — G8220 Paraplegia, unspecified: Secondary | ICD-10-CM | POA: Insufficient documentation

## 2016-12-12 ENCOUNTER — Encounter: Payer: Medicaid Other | Admitting: Obstetrics and Gynecology

## 2016-12-31 ENCOUNTER — Encounter: Payer: Self-pay | Admitting: Obstetrics & Gynecology

## 2016-12-31 ENCOUNTER — Ambulatory Visit (INDEPENDENT_AMBULATORY_CARE_PROVIDER_SITE_OTHER): Payer: Medicaid Other | Admitting: Obstetrics & Gynecology

## 2016-12-31 VITALS — BP 98/61 | HR 98 | Ht 63.0 in | Wt 140.0 lb

## 2016-12-31 DIAGNOSIS — N938 Other specified abnormal uterine and vaginal bleeding: Secondary | ICD-10-CM

## 2016-12-31 MED ORDER — MISOPROSTOL 200 MCG PO TABS: ORAL_TABLET | ORAL | 0 refills | Status: DC

## 2016-12-31 NOTE — Progress Notes (Signed)
   Subjective:    Patient ID: [name], female    DOB: Dec 09, 1980, 36 y.o.   MRN: [id number]  HPI 36 yo SW G0 with a fairly new onset of irregular periods. She had an u/s 10/31/16 that showed some blood versus mass in the endometrium.   Review of Systems Pap 2016 normal    Objective:   Physical Exam  Wheelchair bound white female No apparent distress     Assessment & Plan:  DUB with u/s that showed possible mass versus blood in the endometrial cavity. Will need EMBX, pretreat with cytotec She will need to be taken upstairs to the bed with a lift.

## 2017-01-10 ENCOUNTER — Encounter (HOSPITAL_BASED_OUTPATIENT_CLINIC_OR_DEPARTMENT_OTHER): Payer: Medicaid Other | Attending: Internal Medicine

## 2017-01-10 DIAGNOSIS — G629 Polyneuropathy, unspecified: Secondary | ICD-10-CM | POA: Insufficient documentation

## 2017-01-10 DIAGNOSIS — G8220 Paraplegia, unspecified: Secondary | ICD-10-CM | POA: Insufficient documentation

## 2017-01-10 DIAGNOSIS — G35 Multiple sclerosis: Secondary | ICD-10-CM | POA: Insufficient documentation

## 2017-01-10 DIAGNOSIS — L89324 Pressure ulcer of left buttock, stage 4: Secondary | ICD-10-CM | POA: Insufficient documentation

## 2017-01-17 ENCOUNTER — Encounter (HOSPITAL_BASED_OUTPATIENT_CLINIC_OR_DEPARTMENT_OTHER): Payer: Medicaid Other | Attending: Internal Medicine

## 2017-01-17 ENCOUNTER — Ambulatory Visit (HOSPITAL_BASED_OUTPATIENT_CLINIC_OR_DEPARTMENT_OTHER): Payer: Medicaid Other

## 2017-01-17 DIAGNOSIS — G35 Multiple sclerosis: Secondary | ICD-10-CM | POA: Diagnosis not present

## 2017-01-17 DIAGNOSIS — L89324 Pressure ulcer of left buttock, stage 4: Secondary | ICD-10-CM | POA: Diagnosis present

## 2017-01-17 DIAGNOSIS — G629 Polyneuropathy, unspecified: Secondary | ICD-10-CM | POA: Diagnosis not present

## 2017-01-17 DIAGNOSIS — G8220 Paraplegia, unspecified: Secondary | ICD-10-CM | POA: Diagnosis not present

## 2017-01-28 ENCOUNTER — Encounter: Payer: Self-pay | Admitting: Obstetrics & Gynecology

## 2017-01-28 ENCOUNTER — Other Ambulatory Visit (HOSPITAL_COMMUNITY)
Admission: RE | Admit: 2017-01-28 | Discharge: 2017-01-28 | Disposition: A | Discharge status: Home / Self Care | Payer: Medicaid Other | Source: Ambulatory Visit | Attending: Obstetrics & Gynecology | Admitting: Obstetrics & Gynecology

## 2017-01-28 ENCOUNTER — Ambulatory Visit (INDEPENDENT_AMBULATORY_CARE_PROVIDER_SITE_OTHER): Payer: Medicaid Other | Admitting: Obstetrics & Gynecology

## 2017-01-28 VITALS — BP 104/71 | HR 95 | Wt 140.0 lb

## 2017-01-28 DIAGNOSIS — Z0000 Encounter for general adult medical examination without abnormal findings: Secondary | ICD-10-CM | POA: Diagnosis not present

## 2017-01-28 DIAGNOSIS — Z23 Encounter for immunization: Secondary | ICD-10-CM

## 2017-01-28 DIAGNOSIS — N938 Other specified abnormal uterine and vaginal bleeding: Secondary | ICD-10-CM

## 2017-01-28 NOTE — Addendum Note (Signed)
Addended by: Elta Guadeloupe on: 01/28/2017 03:35 PM   Modules accepted: Orders

## 2017-01-28 NOTE — Progress Notes (Signed)
   Subjective:    Patient ID: [name], female    DOB: [birth year], 36 y.o.   MRN: [id number]  HPI 36 yo SW female here or EMBX due to irregular bleeding and an u/s that showed a possible mass in the endometrium.  Review of Systems     Objective:   Physical Exam  Wheelchair bound paralyzed woman, no distress Cervix VERY anteverted.  Pap smear obtained Blood noted (She took cytotec last night)  UPT negative, consent signed, time out done Cervix grasped with a single tooth tenaculum Uterus sounded to 7cm Pipelle used for 3 passes with a moderate amount of tissue obtained. She tolerated the procedure well. She didn't feel anything.      Assessment & Plan:  Dub- await pathology Pap smear sent

## 2017-01-30 LAB — CYTOLOGY - PAP
DIAGNOSIS: NEGATIVE
HPV: NOT DETECTED

## 2017-02-13 ENCOUNTER — Inpatient Hospital Stay (HOSPITAL_COMMUNITY)
Admission: EM | Admit: 2017-02-13 | Discharge: 2017-03-12 | DRG: 571 | Disposition: A | Discharge status: Home / Self Care | Payer: Medicaid Other | Attending: Nephrology | Admitting: Nephrology

## 2017-02-13 ENCOUNTER — Emergency Department (HOSPITAL_COMMUNITY): Payer: Medicaid Other

## 2017-02-13 ENCOUNTER — Encounter (HOSPITAL_COMMUNITY): Payer: Self-pay | Admitting: Emergency Medicine

## 2017-02-13 DIAGNOSIS — Z91018 Allergy to other foods: Secondary | ICD-10-CM

## 2017-02-13 DIAGNOSIS — M62838 Other muscle spasm: Secondary | ICD-10-CM | POA: Diagnosis present

## 2017-02-13 DIAGNOSIS — Z87440 Personal history of urinary (tract) infections: Secondary | ICD-10-CM

## 2017-02-13 DIAGNOSIS — L89329 Pressure ulcer of left buttock, unspecified stage: Secondary | ICD-10-CM | POA: Diagnosis present

## 2017-02-13 DIAGNOSIS — T8130XA Disruption of wound, unspecified, initial encounter: Secondary | ICD-10-CM | POA: Diagnosis present

## 2017-02-13 DIAGNOSIS — R112 Nausea with vomiting, unspecified: Secondary | ICD-10-CM

## 2017-02-13 DIAGNOSIS — N898 Other specified noninflammatory disorders of vagina: Secondary | ICD-10-CM

## 2017-02-13 DIAGNOSIS — I96 Gangrene, not elsewhere classified: Secondary | ICD-10-CM | POA: Diagnosis present

## 2017-02-13 DIAGNOSIS — A4902 Methicillin resistant Staphylococcus aureus infection, unspecified site: Secondary | ICD-10-CM

## 2017-02-13 DIAGNOSIS — K219 Gastro-esophageal reflux disease without esophagitis: Secondary | ICD-10-CM | POA: Diagnosis present

## 2017-02-13 DIAGNOSIS — G373 Acute transverse myelitis in demyelinating disease of central nervous system: Secondary | ICD-10-CM | POA: Diagnosis present

## 2017-02-13 DIAGNOSIS — L8990 Pressure ulcer of unspecified site, unspecified stage: Secondary | ICD-10-CM

## 2017-02-13 DIAGNOSIS — Z1624 Resistance to multiple antibiotics: Secondary | ICD-10-CM

## 2017-02-13 DIAGNOSIS — I959 Hypotension, unspecified: Secondary | ICD-10-CM | POA: Diagnosis not present

## 2017-02-13 DIAGNOSIS — Z993 Dependence on wheelchair: Secondary | ICD-10-CM

## 2017-02-13 DIAGNOSIS — Z91013 Allergy to seafood: Secondary | ICD-10-CM

## 2017-02-13 DIAGNOSIS — F4310 Post-traumatic stress disorder, unspecified: Secondary | ICD-10-CM | POA: Diagnosis present

## 2017-02-13 DIAGNOSIS — E871 Hypo-osmolality and hyponatremia: Secondary | ICD-10-CM | POA: Diagnosis present

## 2017-02-13 DIAGNOSIS — D638 Anemia in other chronic diseases classified elsewhere: Secondary | ICD-10-CM | POA: Diagnosis present

## 2017-02-13 DIAGNOSIS — E876 Hypokalemia: Secondary | ICD-10-CM | POA: Diagnosis present

## 2017-02-13 DIAGNOSIS — R079 Chest pain, unspecified: Secondary | ICD-10-CM

## 2017-02-13 DIAGNOSIS — D509 Iron deficiency anemia, unspecified: Secondary | ICD-10-CM | POA: Diagnosis present

## 2017-02-13 DIAGNOSIS — Z881 Allergy status to other antibiotic agents status: Secondary | ICD-10-CM

## 2017-02-13 DIAGNOSIS — Z886 Allergy status to analgesic agent status: Secondary | ICD-10-CM

## 2017-02-13 DIAGNOSIS — G35 Multiple sclerosis: Secondary | ICD-10-CM

## 2017-02-13 DIAGNOSIS — K5900 Constipation, unspecified: Secondary | ICD-10-CM | POA: Diagnosis present

## 2017-02-13 DIAGNOSIS — B964 Proteus (mirabilis) (morganii) as the cause of diseases classified elsewhere: Secondary | ICD-10-CM | POA: Diagnosis present

## 2017-02-13 DIAGNOSIS — F329 Major depressive disorder, single episode, unspecified: Secondary | ICD-10-CM | POA: Diagnosis present

## 2017-02-13 DIAGNOSIS — M869 Osteomyelitis, unspecified: Secondary | ICD-10-CM

## 2017-02-13 DIAGNOSIS — B9562 Methicillin resistant Staphylococcus aureus infection as the cause of diseases classified elsewhere: Secondary | ICD-10-CM | POA: Diagnosis present

## 2017-02-13 DIAGNOSIS — L89323 Pressure ulcer of left buttock, stage 3: Principal | ICD-10-CM | POA: Diagnosis present

## 2017-02-13 DIAGNOSIS — T7840XA Allergy, unspecified, initial encounter: Secondary | ICD-10-CM

## 2017-02-13 DIAGNOSIS — A498 Other bacterial infections of unspecified site: Secondary | ICD-10-CM

## 2017-02-13 DIAGNOSIS — A499 Bacterial infection, unspecified: Secondary | ICD-10-CM

## 2017-02-13 DIAGNOSIS — Z79899 Other long term (current) drug therapy: Secondary | ICD-10-CM

## 2017-02-13 DIAGNOSIS — G8220 Paraplegia, unspecified: Secondary | ICD-10-CM | POA: Diagnosis present

## 2017-02-13 DIAGNOSIS — Z888 Allergy status to other drugs, medicaments and biological substances status: Secondary | ICD-10-CM

## 2017-02-13 DIAGNOSIS — Z7401 Bed confinement status: Secondary | ICD-10-CM

## 2017-02-13 LAB — CBC
HEMATOCRIT: 32.3 % — AB (ref 36.0–46.0)
Hemoglobin: 9.3 g/dL — ABNORMAL LOW (ref 12.0–15.0)
MCH: 20.5 pg — AB (ref 26.0–34.0)
MCHC: 28.8 g/dL — AB (ref 30.0–36.0)
MCV: 71.1 fL — AB (ref 78.0–100.0)
PLATELETS: 179 10*3/uL (ref 150–400)
RBC: 4.54 MIL/uL (ref 3.87–5.11)
RDW: 19.1 % — ABNORMAL HIGH (ref 11.5–15.5)
WBC: 7.7 10*3/uL (ref 4.0–10.5)

## 2017-02-13 LAB — I-STAT BETA HCG BLOOD, ED (MC, WL, AP ONLY): HCG, QUANTITATIVE: 5.8 m[IU]/mL — AB (ref <5)

## 2017-02-13 LAB — COMPREHENSIVE METABOLIC PANEL
ALT: 11 U/L — ABNORMAL LOW (ref 14–54)
ANION GAP: 7 (ref 5–15)
AST: 16 U/L (ref 15–41)
Albumin: 4.2 g/dL (ref 3.5–5.0)
Alkaline Phosphatase: 110 U/L (ref 38–126)
BILIRUBIN TOTAL: 0.6 mg/dL (ref 0.3–1.2)
BUN: 11 mg/dL (ref 6–20)
CHLORIDE: 106 mmol/L (ref 101–111)
CO2: 25 mmol/L (ref 22–32)
Calcium: 8.8 mg/dL — ABNORMAL LOW (ref 8.9–10.3)
Creatinine, Ser: 0.31 mg/dL — ABNORMAL LOW (ref 0.44–1.00)
Glucose, Bld: 91 mg/dL (ref 65–99)
POTASSIUM: 3.7 mmol/L (ref 3.5–5.1)
Sodium: 138 mmol/L (ref 135–145)
TOTAL PROTEIN: 7 g/dL (ref 6.5–8.1)

## 2017-02-13 LAB — URINALYSIS, ROUTINE W REFLEX MICROSCOPIC
BILIRUBIN URINE: NEGATIVE
Glucose, UA: NEGATIVE mg/dL
Ketones, ur: NEGATIVE mg/dL
Nitrite: POSITIVE — AB
Protein, ur: 30 mg/dL — AB
SPECIFIC GRAVITY, URINE: 1.016 (ref 1.005–1.030)
SQUAMOUS EPITHELIAL / LPF: NONE SEEN
pH: 5 (ref 5.0–8.0)

## 2017-02-13 LAB — WET PREP, GENITAL
CLUE CELLS WET PREP: NONE SEEN
Sperm: NONE SEEN
TRICH WET PREP: NONE SEEN
YEAST WET PREP: NONE SEEN

## 2017-02-13 NOTE — ED Notes (Signed)
Bed: WA06 Expected date:  Expected time:  Means of arrival:  Comments: EMS decubitus

## 2017-02-13 NOTE — ED Provider Notes (Signed)
[place] DEPT Provider Note   CSN: [id number] Arrival date & time: 02/13/17  1636     History   Chief Complaint Chief Complaint  Patient presents with  . Vaginal Discharge  . Wound Infection    HPI [name] is a 36 y.o. female with a history of paraplegia, and chronic decubitus ulcer, ileal conduit creation with recurrent UTI, transverse myelitis who presents to the emergency department with a chief complaint of worsening, green vaginal discharge that began after the patient underwent an endometrial biopsy on October 29.  She also presents with complaints of worsening redness and swelling around the chronic wound at the base of her left buttock that occurred overnight.  She reports that her home health nurse came and evaluated the wound earlier today and was concerned about the significant change that occurred since yesterday.  She recommended she come to the emergency department for evaluation.  She also complains of associated chills, subjective fever, and nonbloody nonbilious emesis x1 this morning, but has been able to tolerate p.o. Fluids.  She denies nausea, worse than baseline loose stools.  She treated her symptoms with Tylenol, but vomited 20 minutes after taking it.   LMP was 1 week ago.   The history is provided by the patient. No language interpreter was used.  Vaginal Discharge   This is a new problem. The current episode started more than 1 week ago. The problem occurs constantly. The problem has been gradually worsening. The discharge occurs spontaneously. The discharge was green. She is not pregnant. She has not missed her period. Associated symptoms include a fever, diarrhea (chronic) and vomiting. Pertinent negatives include no diaphoresis, no abdominal pain, no nausea and no dysuria. She has tried acetaminophen for the symptoms. The treatment provided no relief. Her past medical history is significant for irregular periods.     Past Medical History:  Diagnosis Date  . Bladder stones    Hx of  . Depression   . GERD (gastroesophageal reflux disease)   . Multiple sclerosis ([place])   . Paraplegia ([place])   . PTSD (post-traumatic stress disorder)   . Transverse myelitis [facility])     Patient Active Problem List   Diagnosis Date Noted  . Wound disruption 02/13/2017  . Decubitus ulcer 11/15/2016  . Sepsis ([place]) 11/14/2016  . Dehydration 11/14/2016  . BV (bacterial vaginosis) 07/17/2016  . Decubitus ulcer of buttock, stage 4 ([place]) 07/17/2016  . Fecal incontinence 02/23/2016  . Osteomyelitis of pelvis ([place]) 02/16/2016  . Myositis 02/16/2016  . Infected decubitus ulcer 02/15/2016  . Pressure injury of skin 01/08/2016  . Ileus ([place]) 10/29/2014  . Constipation 10/29/2014  . Obstipation 09/27/2014  . UTI (urinary tract infection) 03/31/2013  . Influenza 03/30/2013  . Nausea vomiting and diarrhea 03/30/2013  . Hypokalemia 02/16/2012  . Multiple sclerosis ([place]) 10/16/2011  . Paraplegia ([place])   . History of transverse myelitis     Past Surgical History:  Procedure Laterality Date  . CHOLECYSTECTOMY    . CYSTECTOMY W/ URETEROILEAL CONDUIT    . TONSILLECTOMY    . WISDOM TOOTH EXTRACTION      OB History    Gravida Para Term Preterm AB Living   0 0 0 0 0 0   SAB TAB Ectopic Multiple Live Births   0 0 0 0 0       Home Medications    Prior to Admission medications   Medication Sig Start Date End Date Taking? Authorizing Provider  acetaminophen (  TYLENOL) 325 MG tablet Take 650 mg by mouth every 6 (six) hours as needed for fever.   Yes [provider]  baclofen (LIORESAL) 20 MG tablet Take 20 mg by mouth 3 (three) times daily.   Yes [provider]  diazepam (VALIUM) 5 MG tablet Take 1-2 tablets (5-10 mg total) by mouth every 8 (eight) hours. Take 1 tablet in AM, 1 tablet in PM, and 2 tablets at bedtime Patient taking differently: Take 5-10 mg by mouth 3 (three) times daily. Take 1  tablet in AM, 1 tablet in PM, and 2 tablets at bedtime 04/02/13  Yes Robbie Lis, MD  fluticasone [facility]) 50 MCG/ACT nasal spray Place 2 sprays into the nose daily as needed for allergies.    Yes [provider]  gabapentin (NEURONTIN) 400 MG capsule Take 2 capsules (800 mg total) by mouth 3 (three) times daily. 08/13/16  Yes Jaffe, Adam R, DO  ibuprofen (ADVIL,MOTRIN) 200 MG tablet Take 200 mg every 6 (six) hours as needed by mouth for headache or moderate pain.    Yes [provider]  lamoTRIgine (LAMICTAL) 25 MG tablet Take 125 mg by mouth daily.   Yes [provider]  Loperamide HCl (IMODIUM A-D PO) Take 1 tablet by mouth daily as needed (diarrhea).    Yes [provider]  loratadine (CLARITIN) 5 MG/5ML syrup Take 5 mg daily as needed by mouth for allergies.    Yes [provider]  lubiprostone (AMITIZA) 24 MCG capsule Take 24 mcg daily with breakfast by mouth.    Yes [provider]  omeprazole (PRILOSEC) 40 MG capsule Take 40 mg by mouth daily.   Yes [provider]  sertraline (ZOLOFT) 100 MG tablet Take 100 mg by mouth daily.   Yes [provider]  misoprostol (CYTOTEC) 200 MCG tablet Take 3 pills by MOUTH the night before biopsy. Patient not taking: Reported on 01/28/2017 12/31/16   Emily Filbert, MD  polyethylene glycol [facility]) packet Take 17 g by mouth daily as needed. Patient taking differently: Take 17 g daily as needed by mouth for moderate constipation.  11/17/16   Lavina Hamman, MD  saccharomyces boulardii (FLORASTOR) 250 MG capsule Take 1 capsule (250 mg total) by mouth 2 (two) times daily. Patient not taking: Reported on 02/13/2017 11/17/16   Lavina Hamman, MD    Family History Family History  Problem Relation Age of Onset  . Diabetes type II Father   . CAD Father   . Dementia Father   . Stroke Father   . Seizures Father   . Diabetes type II Mother   . Hypertension Mother   . Breast cancer Other    . Diabetes type II Other     Social History Social History   Tobacco Use  . Smoking status: Never Smoker  . Smokeless tobacco: Never Used  Substance Use Topics  . Alcohol use: No  . Drug use: No     Allergies   Iodine; Latex; Other; Shellfish-derived products; Vancomycin; Aspirin; and Strawberry extract   Review of Systems Review of Systems  Constitutional: Positive for fever. Negative for activity change, chills and diaphoresis.  Eyes: Negative for visual disturbance.  Respiratory: Negative for shortness of breath.   Cardiovascular: Negative for chest pain and leg swelling.  Gastrointestinal: Positive for diarrhea (chronic) and vomiting. Negative for abdominal pain and nausea.  Genitourinary: Positive for vaginal discharge. Negative for dysuria and vaginal bleeding.  Musculoskeletal: Negative for arthralgias and back  pain.  Skin: Positive for wound. Negative for rash.  Allergic/Immunologic: Positive for immunocompromised state.  Neurological: Negative for headaches.   Physical Exam Updated Vital Signs BP 111/79   Pulse 89   Temp 98.5 F (36.9 C) (Oral)   Resp 17   SpO2 100%   Physical Exam  Constitutional: No distress.  Poor grooming.  Patient appears unkept.  HENT:  Head: Normocephalic.  Severe periodontal disease.  Eyes: Conjunctivae are normal.  Neck: Neck supple.  Cardiovascular: Normal rate, regular rhythm, normal heart sounds and intact distal pulses. Exam reveals no gallop and no friction rub.  No murmur heard. Pulmonary/Chest: Effort normal and breath sounds normal. No stridor. No respiratory distress. She has no wheezes. She has no rales.  Abdominal: Soft. Bowel sounds are normal. She exhibits no distension. There is no tenderness.  Genitourinary:  Genitourinary Comments: Chaperoned exam. Thick, purulent yellowish/green drainage noted in the vaginal vault.  Tenderness on exam is deferred secondary to the patient's medical history of paraplegia and  transverse myelitis.   Musculoskeletal: She exhibits no edema, tenderness or deformity.  Please see images below for decubitus ulcer.  The wound is at the base of the right buttock.  There is swelling and induration with dried blood extending from 4-6 o'clock from the wound.  No rectal involvement.  No drainage expressed.  Neurological: She is alert.  Skin: Skin is warm. No rash noted.  Psychiatric: Her behavior is normal.  Nursing note and vitals reviewed.        ED Treatments / Results  Labs (all labs ordered are listed, but only abnormal results are displayed) Labs Reviewed  WET PREP, GENITAL - Abnormal; Notable for the following components:      Result Value   WBC, Wet Prep HPF POC MODERATE (*)    All other components within normal limits  CBC - Abnormal; Notable for the following components:   Hemoglobin 9.3 (*)    HCT 32.3 (*)    MCV 71.1 (*)    MCH 20.5 (*)    MCHC 28.8 (*)    RDW 19.1 (*)    All other components within normal limits  COMPREHENSIVE METABOLIC PANEL - Abnormal; Notable for the following components:   Creatinine, Ser 0.31 (*)    Calcium 8.8 (*)    ALT 11 (*)    All other components within normal limits  URINALYSIS, ROUTINE W REFLEX MICROSCOPIC - Abnormal; Notable for the following components:   APPearance HAZY (*)    Hgb urine dipstick MODERATE (*)    Protein, ur 30 (*)    Nitrite POSITIVE (*)    Leukocytes, UA MODERATE (*)    Bacteria, UA MANY (*)    All other components within normal limits  I-STAT BETA HCG BLOOD, ED ([facility], [facility], [facility] ONLY) - Abnormal; Notable for the following components:   I-stat hCG, quantitative 5.8 (*)    All other components within normal limits  GC/CHLAMYDIA PROBE AMP ([place]) NOT AT [facility]    EKG  EKG Interpretation None       Radiology Ct Pelvis Wo Contrast  Result Date: 02/13/2017 CLINICAL DATA:  36 year old female with perirectal wound and discharge. EXAM: CT PELVIS WITHOUT CONTRAST TECHNIQUE: Multidetector CT  imaging of the pelvis was performed following the standard protocol without intravenous contrast. COMPARISON:  Abdominal CT dated 11/15/2016 FINDINGS: Evaluation of this exam is limited in the absence of intravenous contrast. Urinary Tract: The urinary bladder is only partially distended. There is mild thickened appearance of  the bladder wall which may be related to underdistention or trabeculation secondary to chronic bladder dysfunction. Correlation with urinalysis recommended to exclude cystitis. Bowel: There is postsurgical changes of the bowel with right anterior pelvis ileostomy. There is no bowel dilatation or evidence of obstruction within the pelvis. Vascular/Lymphatic: The visualized vasculature are grossly unremarkable on this noncontrast CT. No adenopathy. Reproductive: The uterus is anteverted and grossly unremarkable. There bilateral ovarian follicles. Other: Midline vertical anterior pelvic wall incisional scar. Focal area of scar in the subcutaneous soft tissues of the anterior pelvic wall may be related to prior surgery. No fluid collection. A 10 x 10 mm rounded density in the anterior pelvis along the surgical scar (series 3, image 30) appears similar to prior CT. Musculoskeletal: There is thickening of the skin with induration of the subcutaneous soft tissues of the left perineal and gluteal region. No definite drainable fluid collection or abscess identified. However, a small fluid collection would be difficult to see on these unenhanced CT. Correlation with clinical exam recommended. Ultrasound may provide better evaluation of the superficial soft tissues. There is a left ischial skin ulcer. No fluid collection. There is sclerotic changes of the left ischial tuberosity, likely related to chronic changes. A white blood cell nuclear scan or MRI may provide better evaluation if there is clinical concern for osteomyelitis. There is osteopenia with degenerative changes of the hips. There is fatty  atrophy of the musculature of the pelvic girdle. No acute osseous pathology. IMPRESSION: 1. Thickening of the skin and infiltration of subcutaneous soft tissues of the left perineal and gluteal region. No definite fluid collection or abscess identified. Ultrasound may provide better evaluation with better sensitivity for detection of a small abscess in the superficial soft tissues. 2. Left ischial skin ulcer. A WBC scan or MRI may provide better evaluation if there is clinical concern for osteomyelitis. Electronically Signed   By: Anner Crete M.D.   On: 02/13/2017 21:39    Procedures Procedures (including critical care time)  Medications Ordered in ED Medications - No data to display   Initial Impression / Assessment and Plan / ED Course  I have reviewed the triage vital signs and the nursing notes.  Pertinent labs & imaging results that were available during my care of the patient were reviewed by me and considered in my medical decision making (see chart for details).  Clinical Course as of Feb 13 2349  Wed Feb 13, 2017  2013 Patient rechecked.  The patient reports that she has an anaphylactic allergy to iodine.  She has been able to tolerate non-iodinated contrast previously.  She reports that she is not sexually active nor has ever been sexually active.  The patient's mother also states that the patient's uncle works for the [employer] of medicine and they spoke with him earlier today and he really encouraged her to get admitted at least overnight.  The patient's mother states that if we were to discharge the patient, that the patient's uncle, "would not want to cause any trouble, but would be happy to call and make sure that the patient gets admitted".  The patient's mother states I did not even bring her wheelchair, and I would not feel safe taking her back home in her [place] without her wheelchair.  [MM]    Clinical Course User Index [MM] Ketra Duchesne A, PA-C    36 year old female with  a history of paraplegia, trans-verse myelitis, chronic stage IV decubitus ulcer who presents to the emergency department  with multiple complaints including vaginal discharge and worsening redness and swelling around her chronic decubitus ulcer.  Review of the patient's medical record indicates that the patient was admitted and August and there was concern for osteomyelitis within the chronic wound.  ID was consulted at that time the patient was started on IV Zosyn who felt that osteomyelitis was unlikely given normal ESR and the MRSA from the wound culture was felt to be due to colonization vs contamination.    On physical exam today, there is erythema, warmth, and fluctuance between the 3 and 6:00 area that appears new on the chronic decubitus ulcer.  No active drainage.  CT pelvis without contrast (due to the patient's anaphylactic allergy to iodinated contrast ) did not demonstrate a definite fluid collection or abscess; MRI recommended if there was clinical concern for osteomyelitis.   On pelvic exam, vaginal discharge is noted in the vault.  WBCs seen on wet prep, otherwise unremarkable.  GC and chlamydia pending.  UA consistent with cystitis vs pyelonephritis. CBC is unremarkable. Istat Beta HCG 5.8.  The patient reports that she is not sexually active.  LMP last week.  Discussed and evaluated the patient with Dr. Kathrynn Humble, attending physician.  Given the patient's complex medical history and new changes to chronic wounds in addition to cystitis, will admit the patient for continued evaluation and workup. Dr. Kathrynn Humble has consulted the hospitalist team for admission.  Please see his note for continued evaluation and workup.  Final Clinical Impressions(s) / ED Diagnoses   Final diagnoses:  None    ED Discharge Orders    None       Shenea Giacobbe A, PA-C 02/13/17 2350    Mariabelen Pressly A, PA-C 02/13/17 [place], Ankit, MD 02/14/17 1645

## 2017-02-13 NOTE — ED Notes (Signed)
Patient transported to CT 

## 2017-02-13 NOTE — ED Triage Notes (Signed)
Per EMS, patient from home, c/o wound to rectum x1 day and swollen vagina with green discharge. Home health nurse reports patient had a biopsy to r/o cervical cancer last week. Patient from poor living conditions. Hx MS and paraplegia.  BP 124/92 HR 90

## 2017-02-14 ENCOUNTER — Other Ambulatory Visit: Payer: Self-pay

## 2017-02-14 DIAGNOSIS — L8994 Pressure ulcer of unspecified site, stage 4: Secondary | ICD-10-CM

## 2017-02-14 DIAGNOSIS — G373 Acute transverse myelitis in demyelinating disease of central nervous system: Secondary | ICD-10-CM | POA: Diagnosis not present

## 2017-02-14 DIAGNOSIS — G8220 Paraplegia, unspecified: Secondary | ICD-10-CM | POA: Diagnosis not present

## 2017-02-14 LAB — MRSA PCR SCREENING: MRSA by PCR: NEGATIVE

## 2017-02-14 MED ORDER — SODIUM CHLORIDE 0.9 % IV SOLN: 250.0000 mL | INTRAVENOUS | Status: DC | PRN

## 2017-02-14 MED ORDER — ADULT MULTIVITAMIN LIQUID CH
15.0000 mL | Freq: Every day | ORAL | Status: DC
  Administered 2017-02-14 – 2017-02-17 (×4): 15 mL via ORAL
  Filled 2017-02-14 (×13): qty 15

## 2017-02-14 MED ORDER — BACLOFEN 20 MG PO TABS
20.0000 mg | ORAL_TABLET | Freq: Three times a day (TID) | ORAL | Status: DC
  Administered 2017-02-14 – 2017-02-17 (×12): 20 mg via ORAL
  Filled 2017-02-14 (×12): qty 1

## 2017-02-14 MED ORDER — IBUPROFEN 200 MG PO TABS
200.0000 mg | ORAL_TABLET | Freq: Four times a day (QID) | ORAL | Status: DC | PRN
  Administered 2017-02-15 – 2017-03-06 (×11): 200 mg via ORAL
  Filled 2017-02-14 (×11): qty 1

## 2017-02-14 MED ORDER — SERTRALINE HCL 100 MG PO TABS
100.0000 mg | ORAL_TABLET | Freq: Every day | ORAL | Status: DC
  Administered 2017-02-14 – 2017-02-17 (×4): 100 mg via ORAL
  Filled 2017-02-14 (×4): qty 1

## 2017-02-14 MED ORDER — LAMOTRIGINE 25 MG PO TABS
125.0000 mg | ORAL_TABLET | Freq: Every day | ORAL | Status: DC
  Administered 2017-02-14 – 2017-02-17 (×4): 125 mg via ORAL
  Filled 2017-02-14 (×4): qty 1

## 2017-02-14 MED ORDER — BOOST PLUS PO LIQD
237.0000 mL | Freq: Three times a day (TID) | ORAL | Status: DC
  Administered 2017-02-15 – 2017-03-10 (×30): 237 mL via ORAL
  Filled 2017-02-14 (×79): qty 237

## 2017-02-14 MED ORDER — GABAPENTIN 400 MG PO CAPS
800.0000 mg | ORAL_CAPSULE | Freq: Three times a day (TID) | ORAL | Status: DC
  Administered 2017-02-14 – 2017-03-12 (×76): 800 mg via ORAL
  Filled 2017-02-14 (×24): qty 2
  Filled 2017-02-14: qty 8
  Filled 2017-02-14 (×55): qty 2

## 2017-02-14 MED ORDER — SODIUM CHLORIDE 0.9% FLUSH
3.0000 mL | Freq: Two times a day (BID) | INTRAVENOUS | Status: DC
  Administered 2017-02-14 – 2017-02-17 (×8): 3 mL via INTRAVENOUS

## 2017-02-14 MED ORDER — SODIUM CHLORIDE 0.9% FLUSH: 3.0000 mL | INTRAVENOUS | Status: DC | PRN

## 2017-02-14 MED ORDER — ONDANSETRON HCL 4 MG/2ML IJ SOLN
4.0000 mg | Freq: Four times a day (QID) | INTRAMUSCULAR | Status: DC | PRN
  Administered 2017-02-14 – 2017-03-07 (×11): 4 mg via INTRAVENOUS
  Filled 2017-02-14 (×11): qty 2

## 2017-02-14 MED ORDER — PANTOPRAZOLE SODIUM 20 MG PO TBEC
20.0000 mg | DELAYED_RELEASE_TABLET | Freq: Every day | ORAL | Status: DC
  Administered 2017-02-14 – 2017-02-17 (×4): 20 mg via ORAL
  Filled 2017-02-14 (×5): qty 1

## 2017-02-14 NOTE — H&P (Signed)
History and Physical    SHAMIKA PEDREGON IEP:[id number] DOB: 1980-04-09 DOA: 02/13/2017  PCP: Lennie Odor, PA-C  Patient coming from:  home  Chief Complaint:  Decub wound  HPI: [name] is a 36 y.o. female with medical history significant of MS, tranverse myelitis  bedbound state with decub wound that has been healing well for the past year comes in after her mom noticing the wound has gotten bigger in the last day or so.  She only has wound care nurse come out to her home once a month.  No fevers.  No n/v/d.  Pt referred for admission for possible need of wound debriedement.  Review of Systems: As per HPI otherwise 10 point review of systems negative.   Past Medical History:  Diagnosis Date  . Bladder stones    Hx of  . Depression   . GERD (gastroesophageal reflux disease)   . Multiple sclerosis ([place])   . Paraplegia ([place])   . PTSD (post-traumatic stress disorder)   . Transverse myelitis [facility])     Past Surgical History:  Procedure Laterality Date  . CHOLECYSTECTOMY    . CYSTECTOMY W/ URETEROILEAL CONDUIT    . TONSILLECTOMY    . WISDOM TOOTH EXTRACTION       reports that  has never smoked. she has never used smokeless tobacco. She reports that she does not drink alcohol or use drugs.  Allergies  Allergen Reactions  . Iodine Anaphylaxis  . Latex Anaphylaxis  . Other Anaphylaxis  . Shellfish-Derived Products Anaphylaxis    Reaction unknown  . Vancomycin Anaphylaxis  . Aspirin Other (See Comments)    Reaction unknown  . Strawberry Extract Hives and Rash    Family History  Problem Relation Age of Onset  . Diabetes type II Father   . CAD Father   . Dementia Father   . Stroke Father   . Seizures Father   . Diabetes type II Mother   . Hypertension Mother   . Breast cancer Other   . Diabetes type II Other     Prior to Admission medications   Medication Sig Start Date End Date Taking? Authorizing Provider  acetaminophen (TYLENOL) 325 MG tablet Take 650  mg by mouth every 6 (six) hours as needed for fever.   Yes [provider]  baclofen (LIORESAL) 20 MG tablet Take 20 mg by mouth 3 (three) times daily.   Yes [provider]  diazepam (VALIUM) 5 MG tablet Take 1-2 tablets (5-10 mg total) by mouth every 8 (eight) hours. Take 1 tablet in AM, 1 tablet in PM, and 2 tablets at bedtime Patient taking differently: Take 5-10 mg by mouth 3 (three) times daily. Take 1 tablet in AM, 1 tablet in PM, and 2 tablets at bedtime 04/02/13  Yes Robbie Lis, MD  fluticasone [facility]) 50 MCG/ACT nasal spray Place 2 sprays into the nose daily as needed for allergies.    Yes [provider]  gabapentin (NEURONTIN) 400 MG capsule Take 2 capsules (800 mg total) by mouth 3 (three) times daily. 08/13/16  Yes Jaffe, Adam R, DO  ibuprofen (ADVIL,MOTRIN) 200 MG tablet Take 200 mg every 6 (six) hours as needed by mouth for headache or moderate pain.    Yes [provider]  lamoTRIgine (LAMICTAL) 25 MG tablet Take 125 mg by mouth daily.   Yes [provider]  Loperamide HCl (IMODIUM A-D PO) Take 1 tablet by mouth daily as needed (diarrhea).    Yes [provider]  loratadine (CLARITIN) 5 MG/5ML syrup Take 5 mg daily as needed by mouth for allergies.    Yes [provider]  lubiprostone (AMITIZA) 24 MCG capsule Take 24 mcg daily with breakfast by mouth.    Yes [provider]  omeprazole (PRILOSEC) 40 MG capsule Take 40 mg by mouth daily.   Yes [provider]  sertraline (ZOLOFT) 100 MG tablet Take 100 mg by mouth daily.   Yes [provider]  misoprostol (CYTOTEC) 200 MCG tablet Take 3 pills by MOUTH the night before biopsy. Patient not taking: Reported on 01/28/2017 12/31/16   Emily Filbert, MD  polyethylene glycol [facility]) packet Take 17 g by mouth daily as needed. Patient taking differently: Take 17 g daily as needed by mouth for moderate constipation.  11/17/16   Lavina Hamman, MD    saccharomyces boulardii (FLORASTOR) 250 MG capsule Take 1 capsule (250 mg total) by mouth 2 (two) times daily. Patient not taking: Reported on 02/13/2017 11/17/16   Lavina Hamman, MD    Physical Exam: Vitals:   02/13/17 1652 02/13/17 1945 02/13/17 2019 02/13/17 2224  BP: 116/87 114/87 113/75 111/79  Pulse: 90 98 95 89  Resp: 16 18 16 17   Temp:   98.5 F (36.9 C)   TempSrc:   Oral   SpO2: 98% 100% 100% 100%      Constitutional: NAD, calm, comfortable Vitals:   02/13/17 1652 02/13/17 1945 02/13/17 2019 02/13/17 2224  BP: 116/87 114/87 113/75 111/79  Pulse: 90 98 95 89  Resp: 16 18 16 17   Temp:   98.5 F (36.9 C)   TempSrc:   Oral   SpO2: 98% 100% 100% 100%   Eyes: PERRL, lids and conjunctivae normal ENMT: Mucous membranes are moist. Posterior pharynx clear of any exudate or lesions.Normal dentition.  Neck: normal, supple, no masses, no thyromegaly Respiratory: clear to auscultation bilaterally, no wheezing, no crackles. Normal respiratory effort. No accessory muscle use.  Cardiovascular: Regular rate and rhythm, no murmurs / rubs / gallops. No extremity edema. 2+ pedal pulses. No carotid bruits.  Abdomen: no tenderness, no masses palpated. No hepatosplenomegaly. Bowel sounds positive.  Musculoskeletal: no clubbing / cyanosis. No joint deformity upper and lower extremities. Good ROM, no contractures. Normal muscle tone.  Skin: decub wound with some necrotic tissue, see ED PA pictures Neurologic: paraplegic Psychiatric: Normal judgment and insight. Alert and oriented x 3. Normal mood.    Labs on Admission: I have personally reviewed following labs and imaging studies  CBC: Recent Labs  Lab 02/13/17 1935  WBC 7.7  HGB 9.3*  HCT 32.3*  MCV 71.1*  PLT 277   Basic Metabolic Panel: Recent Labs  Lab 02/13/17 1935  NA 138  K 3.7  CL 106  CO2 25  GLUCOSE 91  BUN 11  CREATININE 0.31*  CALCIUM 8.8*   GFR: CrCl cannot be calculated (Unknown ideal  weight.). Liver Function Tests: Recent Labs  Lab 02/13/17 1935  AST 16  ALT 11*  ALKPHOS 110  BILITOT 0.6  PROT 7.0  ALBUMIN 4.2   No results for input(s): LIPASE, AMYLASE in the last 168 hours. No results for input(s): AMMONIA in the last 168 hours. Coagulation Profile: No results for input(s): INR, PROTIME in the last 168 hours. Cardiac Enzymes: No results for input(s): CKTOTAL, CKMB, CKMBINDEX, TROPONINI in the last 168 hours. BNP (last 3 results) No results for input(s): PROBNP in the last 8760 hours. HbA1C: No results for input(s): HGBA1C  in the last 72 hours. CBG: No results for input(s): GLUCAP in the last 168 hours. Lipid Profile: No results for input(s): CHOL, HDL, LDLCALC, TRIG, CHOLHDL, LDLDIRECT in the last 72 hours. Thyroid Function Tests: No results for input(s): TSH, T4TOTAL, FREET4, T3FREE, THYROIDAB in the last 72 hours. Anemia Panel: No results for input(s): VITAMINB12, FOLATE, FERRITIN, TIBC, IRON, RETICCTPCT in the last 72 hours. Urine analysis:    Component Value Date/Time   COLORURINE YELLOW 02/13/2017 2035   APPEARANCEUR HAZY (A) 02/13/2017 2035   LABSPEC 1.016 02/13/2017 2035   PHURINE 5.0 02/13/2017 2035   GLUCOSEU NEGATIVE 02/13/2017 2035   HGBUR MODERATE (A) 02/13/2017 2035   BILIRUBINUR NEGATIVE 02/13/2017 2035   KETONESUR NEGATIVE 02/13/2017 2035   PROTEINUR 30 (A) 02/13/2017 2035   UROBILINOGEN 2.0 (H) 10/29/2014 1627   NITRITE POSITIVE (A) 02/13/2017 2035   LEUKOCYTESUR MODERATE (A) 02/13/2017 2035   Sepsis Labs: !!!!!!!!!!!!!!!!!!!!!!!!!!!!!!!!!!!!!!!!!!!! @LABRCNTIP (procalcitonin:4,lacticidven:4) ) Recent Results (from the past 240 hour(s))  Wet prep, genital     Status: Abnormal   Collection Time: 02/13/17  6:38 PM  Result Value Ref Range Status   Yeast Wet Prep HPF POC NONE SEEN NONE SEEN Final   Trich, Wet Prep NONE SEEN NONE SEEN Final   Clue Cells Wet Prep HPF POC NONE SEEN NONE SEEN Final   WBC, Wet Prep HPF POC MODERATE  (A) NONE SEEN Final   Sperm NONE SEEN  Final     Radiological Exams on Admission: Ct Pelvis Wo Contrast  Result Date: 02/13/2017 CLINICAL DATA:  36 year old female with perirectal wound and discharge. EXAM: CT PELVIS WITHOUT CONTRAST TECHNIQUE: Multidetector CT imaging of the pelvis was performed following the standard protocol without intravenous contrast. COMPARISON:  Abdominal CT dated 11/15/2016 FINDINGS: Evaluation of this exam is limited in the absence of intravenous contrast. Urinary Tract: The urinary bladder is only partially distended. There is mild thickened appearance of the bladder wall which may be related to underdistention or trabeculation secondary to chronic bladder dysfunction. Correlation with urinalysis recommended to exclude cystitis. Bowel: There is postsurgical changes of the bowel with right anterior pelvis ileostomy. There is no bowel dilatation or evidence of obstruction within the pelvis. Vascular/Lymphatic: The visualized vasculature are grossly unremarkable on this noncontrast CT. No adenopathy. Reproductive: The uterus is anteverted and grossly unremarkable. There bilateral ovarian follicles. Other: Midline vertical anterior pelvic wall incisional scar. Focal area of scar in the subcutaneous soft tissues of the anterior pelvic wall may be related to prior surgery. No fluid collection. A 10 x 10 mm rounded density in the anterior pelvis along the surgical scar (series 3, image 30) appears similar to prior CT. Musculoskeletal: There is thickening of the skin with induration of the subcutaneous soft tissues of the left perineal and gluteal region. No definite drainable fluid collection or abscess identified. However, a small fluid collection would be difficult to see on these unenhanced CT. Correlation with clinical exam recommended. Ultrasound may provide better evaluation of the superficial soft tissues. There is a left ischial skin ulcer. No fluid collection. There is sclerotic  changes of the left ischial tuberosity, likely related to chronic changes. A white blood cell nuclear scan or MRI may provide better evaluation if there is clinical concern for osteomyelitis. There is osteopenia with degenerative changes of the hips. There is fatty atrophy of the musculature of the pelvic girdle. No acute osseous pathology. IMPRESSION: 1. Thickening of the skin and infiltration of subcutaneous soft tissues of the left perineal and gluteal region.  No definite fluid collection or abscess identified. Ultrasound may provide better evaluation with better sensitivity for detection of a small abscess in the superficial soft tissues. 2. Left ischial skin ulcer. A WBC scan or MRI may provide better evaluation if there is clinical concern for osteomyelitis. Electronically Signed   By: Anner Crete M.D.   On: 02/13/2017 21:39     Assessment/Plan 36 yo female bedbound due to [place] comes in with worsening decub wound Principal Problem:   Decubitus ulcer- obtain wound care consult in am for debridement if needed, if not set up more aggressive home wound care  Active Problems:   Paraplegia ([place])- noted   History of transverse myelitis- noted    home meds pending   DVT prophylaxis:  scds Code Status:  full Family Communication: mom  Disposition Plan:  Per day team Consults called:  Wound care  Admission status:  observation   Marsia Cino A MD [facility]  If 7PM-7AM, please contact night-coverage [email or web address] Password [email or web address]  02/14/2017, 12:10 AM

## 2017-02-14 NOTE — Progress Notes (Signed)
Patient seen and examined with mother at bedside, stable, no fever, denies pain. Wound care following.  may need debridement in a few days. Case manager consulted as well.

## 2017-02-14 NOTE — Progress Notes (Signed)
Initial Nutrition Assessment  INTERVENTION:   -Provide Boost Plus chocolate TID- Each supplement provides 360kcal and 14g protein.   -Provide liquid MVI -RD will continue to monitor  NUTRITION DIAGNOSIS:   Increased nutrient needs related to wound healing as evidenced by estimated needs.  GOAL:   Patient will meet greater than or equal to 90% of their needs  MONITOR:   PO intake, Supplement acceptance, Labs, Weight trends, Skin, I & O's  REASON FOR ASSESSMENT:   Low Braden    ASSESSMENT:   36 y.o. female with medical history significant of MS, tranverse myelitis  bedbound state with decub wound that has been healing well for the past year comes in after her mom noticing the wound has gotten bigger in the last day or so.  She only has wound care nurse come out to her home once a month.  No fevers.  No n/v/d.  Pt referred for admission for possible need of wound debriedement.  Patient in room with mother at bedside. Pt reports not eating anything for lunch today and only had applesauce this morning d/t increased nausea. States she feels better since receiving Zofran. Typically pt consume 1-2 meals a day d/t sleeping in the morning from taking muscle relaxers. Pt paraplegic and most care is done by the mother. Mother provided some history but gets off on tangents and speaks of her own medical issues which can inhibit her care of the patient.  Pt does like Boost drinks and eats Premier Protein bars at home in an effort to heal a previous decubitus ulcer. Pt would like to try Boost during this admission. RD will order.  Per chart review, pt has lost weight but noted some weights being exactly the same to the decimal. Suspect these are not accurate. Will monitor weight trends.  Labs reviewed. Medications: Protonix tablet daily, IV Zofran PRN  NUTRITION - FOCUSED PHYSICAL EXAM:  NFPE not indicative of malnutrition given history of MS and paraplegia.  Diet Order:  Diet Heart Room  service appropriate? Yes; Fluid consistency: Thin  EDUCATION NEEDS:   Education needs have been addressed  Skin:  Skin Assessment: Skin Integrity Issues: Skin Integrity Issues:: Stage II, Other (Comment) Stage II: sacrum Other: buttocks wound  Last BM:  11/15  Height:   Ht Readings from Last 1 Encounters:  02/14/17 5\' 3"  (1.6 m)    Weight:   Wt Readings from Last 1 Encounters:  02/14/17 131 lb 9.8 oz (59.7 kg)    Ideal Body Weight:  49.7 kg  BMI:  Body mass index is 23.31 kg/m.  Estimated Nutritional Needs:   Kcal:  1500-1700  Protein:  70-80g  Fluid:  1.7L/day  Clayton Bibles, MS, RD, LDN [facility] Pager: [phone] After Hours Pager: [phone]

## 2017-02-14 NOTE — Consult Note (Addendum)
[place] Nurse wound consult note Reason for Consult:unstageable wound on Ischial Tuberosity with DTI area Wound type:pressure Pressure Injury POA: Yes Measurement:8.5cm x 5cm x 0.1 cm DTI, periwound with induration At the 2 o'clock position there is a 4cm x 1cm x 0.1cm area that connects to IT with 100% black eschar Wound bed:see above Drainage (amount, consistency, odor) moderate exudate Periwound: see above Dressing procedure/placement/frequency: This patient was seen and assess with my co-worker Maudie Flakes, [place] RN,  I have provided nurses with orders for NS moist to dry dressing changes BID. [place] team will reassess Monday to see if debridement is needed. Mattress with low air loss feature ordered.  Pt and nursing staff educated on need for pt to keep pressure off the area. Pt is to keep HOB down except while eating. Pt and her mother able to verbalize understanding. We will follow this patient and remain available to this patient, nursing, and the medical and surgical teams.Please re-consult if we need to assist further before we return on Monday.   Fara Olden, RN-C, WTA-C, [place] Wound Treatment Associate Ostomy Care Associate

## 2017-02-15 ENCOUNTER — Observation Stay (HOSPITAL_COMMUNITY): Payer: Medicaid Other

## 2017-02-15 DIAGNOSIS — G373 Acute transverse myelitis in demyelinating disease of central nervous system: Secondary | ICD-10-CM | POA: Diagnosis not present

## 2017-02-15 DIAGNOSIS — Z936 Other artificial openings of urinary tract status: Secondary | ICD-10-CM

## 2017-02-15 DIAGNOSIS — L89150 Pressure ulcer of sacral region, unstageable: Secondary | ICD-10-CM | POA: Diagnosis not present

## 2017-02-15 DIAGNOSIS — G8220 Paraplegia, unspecified: Secondary | ICD-10-CM | POA: Diagnosis not present

## 2017-02-15 DIAGNOSIS — G35 Multiple sclerosis: Secondary | ICD-10-CM | POA: Diagnosis not present

## 2017-02-15 LAB — BASIC METABOLIC PANEL
Anion gap: 9 (ref 5–15)
BUN: 9 mg/dL (ref 6–20)
CALCIUM: 9.1 mg/dL (ref 8.9–10.3)
CO2: 29 mmol/L (ref 22–32)
Chloride: 104 mmol/L (ref 101–111)
Creatinine, Ser: 0.44 mg/dL (ref 0.44–1.00)
GFR calc Af Amer: >60 mL/min (ref >60)
Glucose, Bld: 94 mg/dL (ref 65–99)
POTASSIUM: 3.2 mmol/L — AB (ref 3.5–5.1)
SODIUM: 142 mmol/L (ref 135–145)

## 2017-02-15 LAB — CBC WITH DIFFERENTIAL/PLATELET
BASOS PCT: 0 %
Basophils Absolute: 0 10*3/uL (ref 0.0–0.1)
EOS ABS: 0.1 10*3/uL (ref 0.0–0.7)
Eosinophils Relative: 2 %
HCT: 32.7 % — ABNORMAL LOW (ref 36.0–46.0)
Hemoglobin: 9.2 g/dL — ABNORMAL LOW (ref 12.0–15.0)
Lymphocytes Relative: 23 %
Lymphs Abs: 1.4 10*3/uL (ref 0.7–4.0)
MCH: 20.3 pg — AB (ref 26.0–34.0)
MCHC: 28.1 g/dL — ABNORMAL LOW (ref 30.0–36.0)
MCV: 72 fL — ABNORMAL LOW (ref 78.0–100.0)
MONO ABS: 0.3 10*3/uL (ref 0.1–1.0)
Monocytes Relative: 5 %
NEUTROS PCT: 70 %
Neutro Abs: 4.5 10*3/uL (ref 1.7–7.7)
PLATELETS: 191 10*3/uL (ref 150–400)
RBC: 4.54 MIL/uL (ref 3.87–5.11)
RDW: 19.6 % — AB (ref 11.5–15.5)
WBC: 6.3 10*3/uL (ref 4.0–10.5)

## 2017-02-15 LAB — LACTIC ACID, PLASMA: Lactic Acid, Venous: 0.8 mmol/L (ref 0.5–1.9)

## 2017-02-15 LAB — SEDIMENTATION RATE: Sed Rate: 7 mm/hr (ref 0–22)

## 2017-02-15 LAB — C-REACTIVE PROTEIN: CRP: 6.8 mg/dL — ABNORMAL HIGH (ref <1.0)

## 2017-02-15 LAB — MAGNESIUM: MAGNESIUM: 2.1 mg/dL (ref 1.7–2.4)

## 2017-02-15 MED ORDER — GADOBENATE DIMEGLUMINE 529 MG/ML IV SOLN
15.0000 mL | Freq: Once | INTRAVENOUS | Status: AC | PRN
  Administered 2017-02-15: 13 mL via INTRAVENOUS

## 2017-02-15 MED ORDER — SENNOSIDES-DOCUSATE SODIUM 8.6-50 MG PO TABS
1.0000 | ORAL_TABLET | Freq: Two times a day (BID) | ORAL | Status: DC
  Administered 2017-02-15 – 2017-03-11 (×36): 1 via ORAL
  Filled 2017-02-15 (×47): qty 1

## 2017-02-15 MED ORDER — BISACODYL 10 MG RE SUPP: 10.0000 mg | Freq: Every day | RECTAL | 0 refills | Status: DC

## 2017-02-15 MED ORDER — BISACODYL 10 MG RE SUPP
10.0000 mg | Freq: Every day | RECTAL | Status: DC
  Administered 2017-02-16 – 2017-03-10 (×9): 10 mg via RECTAL
  Filled 2017-02-15 (×21): qty 1

## 2017-02-15 MED ORDER — POLYETHYLENE GLYCOL 3350 17 G PO PACK
17.0000 g | PACK | Freq: Every day | ORAL | Status: DC
  Filled 2017-02-15: qty 1

## 2017-02-15 MED ORDER — SENNOSIDES-DOCUSATE SODIUM 8.6-50 MG PO TABS: 1.0000 | ORAL_TABLET | Freq: Every day | ORAL | 0 refills | Status: DC

## 2017-02-15 MED ORDER — POTASSIUM CHLORIDE CRYS ER 20 MEQ PO TBCR
40.0000 meq | EXTENDED_RELEASE_TABLET | Freq: Once | ORAL | Status: AC
  Administered 2017-02-15: 40 meq via ORAL
  Filled 2017-02-15: qty 2

## 2017-02-15 MED ORDER — POLYETHYLENE GLYCOL 3350 17 G PO PACK: 17.0000 g | PACK | Freq: Every day | ORAL | 0 refills | Status: DC
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
## 2017-02-15 NOTE — Consult Note (Signed)
[place] Nurse wound follow up Wound type: unstageable  Measurement: see note from yesterday 11/15 Patient's VS remain stable, afebrile, WBC normal, not on antibiotics. Dressing was changed 3 hours ago, no complications. Will check patient and wound status Monday.  Please re-consult if we need to assist further.   Fara Olden, RN-C, WTA-C Wound Treatment Associate

## 2017-02-15 NOTE — Progress Notes (Signed)
PROGRESS NOTE  [name] EVO:[id number] DOB: 23-Apr-1980 DOA: 02/13/2017 PCP: Lennie Odor, PA-C  HPI/Recap of past 24 hours:  Awaiting mri result,    Assessment/Plan: Principal Problem:   Decubitus ulcer Active Problems:   Paraplegia ([place])   History of transverse myelitis  Decubitus ulcer:  CT pel no abscess, but could not rule osteomyelitis MRI pelvic pending No leukocytosis, no fever, esr/crp wnl Wound care consulted.  "[place] Nurse wound consult note: Reason for Consult:unstageable wound on Ischial Tuberosity with DTI area Wound type:pressure Pressure Injury POA: Yes Measurement:8.5cm x 5cm x 0.1 cm DTI, periwound with induration At the 2 o'clock position there is a 4cm x 1cm x 0.1cm area that connects to IT with 100% black eschar Wound bed:see above Drainage (amount, consistency, odor) moderate exudate Periwound: see above Dressing procedure/placement/frequency: This patient was seen and assess with my co-worker Maudie Flakes, [place] RN,  I have provided nurses with orders for NS moist to dry dressing changes BID. [place] team will reassess Monday to see if debridement is needed. Mattress with low air loss feature ordered.  Pt and nursing staff educated on need for pt to keep pressure off the area. Pt is to keep HOB down except while eating. Pt and her mother able to verbalize understanding. We will follow this patient and remain available to this patient, nursing, and the medical and surgical teams.Please re-consult if we need to assist further before we return on Monday. 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   Constipation: declined miralax, she reports miralax caused gi bleed in the past. Start senokot-s/dulcolaxsuppository    Chronic anemia: hgb at baseline  Code Status: full  Family Communication: patient and mother  Disposition Plan: home with  home health   Consultants:  Wound care, case manager  Procedures:  none  Antibiotics:  none   Objective: BP 109/65 (BP Location: Right Arm)   Pulse 88   Temp 98.9 F (37.2 C) (Oral)   Resp 18   Ht _0  (1.6 m)   Wt 59.7 kg (131 lb 9.8 oz)   SpO2 100%   BMI 23.31 kg/m   Intake/Output Summary (Last 24 hours) at 02/15/2017 1851 Last data filed at 02/15/2017 1400 Gross per 24 hour  Intake 963 ml  Output 850 ml  Net 113 ml   Filed Weights   02/14/17 0057  Weight: 59.7 kg (131 lb 9.8 oz)    Exam: Patient is examined daily including today on 02/15/2017, exams remain the same as of yesterday except that has changed    General:  NAD  Cardiovascular: RRR  Respiratory: CTABL  Abdomen: Soft/ND/NT, positive BS, + urostomy  Musculoskeletal: No Edema  Neuro: alert, oriented   unstageable Sacral wound   Data Reviewed: Basic Metabolic Panel: Recent Labs  Lab 02/13/17 1935 02/15/17 0521  NA 138 142  K 3.7 3.2*  CL 106 104  CO2 25 29  GLUCOSE 91 94  BUN 11 9  CREATININE 0.31* 0.44  CALCIUM 8.8* 9.1  MG  --  2.1   Liver Function Tests: Recent Labs  Lab 02/13/17 1935  AST 16  ALT 11*  ALKPHOS 110  BILITOT 0.6  PROT 7.0  ALBUMIN 4.2   No results for input(s): LIPASE, AMYLASE in the last 168 hours. No results for input(s): AMMONIA in the last 168 hours. CBC: Recent Labs  Lab 02/13/17 1935 02/15/17 0521  WBC 7.7 6.3  NEUTROABS  --  4.5  HGB 9.3* 9.2*  HCT 32.3* 32.7*  MCV 71.1* 72.0*  PLT 179 191   Cardiac Enzymes:   No results for input(s): CKTOTAL, CKMB, CKMBINDEX, TROPONINI in the last 168 hours. BNP (last 3 results) No results for input(s): BNP in the last 8760 hours.  ProBNP (last 3 results) No results for input(s): PROBNP in the last 8760 hours.  CBG: No results for input(s): GLUCAP in the last 168 hours.  Recent Results (from the past 240 hour(s))  Wet prep, genital     Status: Abnormal   Collection Time: 02/13/17  6:38  PM  Result Value Ref Range Status   Yeast Wet Prep HPF POC NONE SEEN NONE SEEN Final   Trich, Wet Prep NONE SEEN NONE SEEN Final   Clue Cells Wet Prep HPF POC NONE SEEN NONE SEEN Final   WBC, Wet Prep HPF POC MODERATE (A) NONE SEEN Final   Sperm NONE SEEN  Final  MRSA PCR Screening     Status: None   Collection Time: 02/14/17 11:20 AM  Result Value Ref Range Status   MRSA by PCR NEGATIVE NEGATIVE Final    Comment:        The GeneXpert MRSA Assay (FDA approved for NASAL specimens only), is one component of a comprehensive MRSA colonization surveillance program. It is not intended to diagnose MRSA infection nor to guide or monitor treatment for MRSA infections.      Studies: No results found.  Scheduled Meds: . baclofen  20 mg Oral TID  . bisacodyl  10 mg Rectal Daily  . gabapentin  800 mg Oral TID  . lactose free nutrition  237 mL Oral TID WC  . lamoTRIgine  125 mg Oral Daily  . multivitamin  15 mL Oral Daily  . pantoprazole  20 mg Oral Daily  . senna-docusate  1 tablet Oral BID  . sertraline  100 mg Oral Daily  . sodium chloride flush  3 mL Intravenous Q12H    Continuous Infusions: . sodium chloride       Time spent: 35 mins, more than 50% time spent on coordination of care. I have personally reviewed and interpreted on  02/15/2017 daily labs, imagings as discussed above under date review session and assessment and plans.  I reviewed all nursing notes, pharmacy notes, consultant notes,  vitals, pertinent old records  I have discussed plan of care as described above with RN , patient and family on 02/15/2017   Florencia Reasons MD, PhD  [facility] Pager [phone]. If 7PM-7AM, please contact night-coverage at [email or web address], password [facility] 02/15/2017, 6:51 PM  LOS: 0 days

## 2017-02-16 DIAGNOSIS — Z91018 Allergy to other foods: Secondary | ICD-10-CM | POA: Diagnosis not present

## 2017-02-16 DIAGNOSIS — A499 Bacterial infection, unspecified: Secondary | ICD-10-CM | POA: Diagnosis not present

## 2017-02-16 DIAGNOSIS — B9689 Other specified bacterial agents as the cause of diseases classified elsewhere: Secondary | ICD-10-CM | POA: Diagnosis not present

## 2017-02-16 DIAGNOSIS — M62562 Muscle wasting and atrophy, not elsewhere classified, left lower leg: Secondary | ICD-10-CM | POA: Diagnosis not present

## 2017-02-16 DIAGNOSIS — T7840XD Allergy, unspecified, subsequent encounter: Secondary | ICD-10-CM | POA: Diagnosis not present

## 2017-02-16 DIAGNOSIS — N898 Other specified noninflammatory disorders of vagina: Secondary | ICD-10-CM | POA: Diagnosis present

## 2017-02-16 DIAGNOSIS — D509 Iron deficiency anemia, unspecified: Secondary | ICD-10-CM | POA: Diagnosis present

## 2017-02-16 DIAGNOSIS — L89323 Pressure ulcer of left buttock, stage 3: Secondary | ICD-10-CM | POA: Diagnosis present

## 2017-02-16 DIAGNOSIS — Z993 Dependence on wheelchair: Secondary | ICD-10-CM | POA: Diagnosis not present

## 2017-02-16 DIAGNOSIS — K219 Gastro-esophageal reflux disease without esophagitis: Secondary | ICD-10-CM | POA: Diagnosis present

## 2017-02-16 DIAGNOSIS — A4902 Methicillin resistant Staphylococcus aureus infection, unspecified site: Secondary | ICD-10-CM | POA: Diagnosis not present

## 2017-02-16 DIAGNOSIS — A498 Other bacterial infections of unspecified site: Secondary | ICD-10-CM | POA: Diagnosis not present

## 2017-02-16 DIAGNOSIS — M868X8 Other osteomyelitis, other site: Secondary | ICD-10-CM | POA: Diagnosis not present

## 2017-02-16 DIAGNOSIS — L89154 Pressure ulcer of sacral region, stage 4: Secondary | ICD-10-CM | POA: Diagnosis not present

## 2017-02-16 DIAGNOSIS — G43A0 Cyclical vomiting, not intractable: Secondary | ICD-10-CM | POA: Diagnosis not present

## 2017-02-16 DIAGNOSIS — M62561 Muscle wasting and atrophy, not elsewhere classified, right lower leg: Secondary | ICD-10-CM | POA: Diagnosis not present

## 2017-02-16 DIAGNOSIS — Z1624 Resistance to multiple antibiotics: Secondary | ICD-10-CM | POA: Diagnosis not present

## 2017-02-16 DIAGNOSIS — L8994 Pressure ulcer of unspecified site, stage 4: Secondary | ICD-10-CM | POA: Diagnosis present

## 2017-02-16 DIAGNOSIS — B9562 Methicillin resistant Staphylococcus aureus infection as the cause of diseases classified elsewhere: Secondary | ICD-10-CM | POA: Diagnosis not present

## 2017-02-16 DIAGNOSIS — Z886 Allergy status to analgesic agent status: Secondary | ICD-10-CM | POA: Diagnosis not present

## 2017-02-16 DIAGNOSIS — Z87440 Personal history of urinary (tract) infections: Secondary | ICD-10-CM | POA: Diagnosis not present

## 2017-02-16 DIAGNOSIS — F4310 Post-traumatic stress disorder, unspecified: Secondary | ICD-10-CM | POA: Diagnosis present

## 2017-02-16 DIAGNOSIS — T8130XA Disruption of wound, unspecified, initial encounter: Secondary | ICD-10-CM | POA: Diagnosis not present

## 2017-02-16 DIAGNOSIS — R112 Nausea with vomiting, unspecified: Secondary | ICD-10-CM | POA: Diagnosis not present

## 2017-02-16 DIAGNOSIS — I959 Hypotension, unspecified: Secondary | ICD-10-CM | POA: Diagnosis not present

## 2017-02-16 DIAGNOSIS — G373 Acute transverse myelitis in demyelinating disease of central nervous system: Secondary | ICD-10-CM | POA: Diagnosis not present

## 2017-02-16 DIAGNOSIS — Z91013 Allergy to seafood: Secondary | ICD-10-CM | POA: Diagnosis not present

## 2017-02-16 DIAGNOSIS — Z7401 Bed confinement status: Secondary | ICD-10-CM | POA: Diagnosis not present

## 2017-02-16 DIAGNOSIS — E876 Hypokalemia: Secondary | ICD-10-CM | POA: Diagnosis present

## 2017-02-16 DIAGNOSIS — G8220 Paraplegia, unspecified: Secondary | ICD-10-CM | POA: Diagnosis present

## 2017-02-16 DIAGNOSIS — L89150 Pressure ulcer of sacral region, unstageable: Secondary | ICD-10-CM | POA: Diagnosis not present

## 2017-02-16 DIAGNOSIS — L89159 Pressure ulcer of sacral region, unspecified stage: Secondary | ICD-10-CM | POA: Diagnosis not present

## 2017-02-16 DIAGNOSIS — K5900 Constipation, unspecified: Secondary | ICD-10-CM | POA: Diagnosis present

## 2017-02-16 DIAGNOSIS — Z881 Allergy status to other antibiotic agents status: Secondary | ICD-10-CM | POA: Diagnosis not present

## 2017-02-16 DIAGNOSIS — T7840XS Allergy, unspecified, sequela: Secondary | ICD-10-CM | POA: Diagnosis not present

## 2017-02-16 DIAGNOSIS — G35 Multiple sclerosis: Secondary | ICD-10-CM | POA: Diagnosis present

## 2017-02-16 DIAGNOSIS — Z931 Gastrostomy status: Secondary | ICD-10-CM | POA: Diagnosis not present

## 2017-02-16 DIAGNOSIS — L89329 Pressure ulcer of left buttock, unspecified stage: Secondary | ICD-10-CM | POA: Diagnosis present

## 2017-02-16 DIAGNOSIS — Z888 Allergy status to other drugs, medicaments and biological substances status: Secondary | ICD-10-CM | POA: Diagnosis not present

## 2017-02-16 DIAGNOSIS — M62838 Other muscle spasm: Secondary | ICD-10-CM | POA: Diagnosis present

## 2017-02-16 DIAGNOSIS — I96 Gangrene, not elsewhere classified: Secondary | ICD-10-CM | POA: Diagnosis present

## 2017-02-16 DIAGNOSIS — M869 Osteomyelitis, unspecified: Secondary | ICD-10-CM | POA: Diagnosis present

## 2017-02-16 DIAGNOSIS — Z79899 Other long term (current) drug therapy: Secondary | ICD-10-CM | POA: Diagnosis not present

## 2017-02-16 DIAGNOSIS — E871 Hypo-osmolality and hyponatremia: Secondary | ICD-10-CM | POA: Diagnosis present

## 2017-02-16 LAB — BASIC METABOLIC PANEL
ANION GAP: 10 (ref 5–15)
BUN: 10 mg/dL (ref 6–20)
CHLORIDE: 103 mmol/L (ref 101–111)
CO2: 27 mmol/L (ref 22–32)
CREATININE: 0.32 mg/dL — AB (ref 0.44–1.00)
Calcium: 9.3 mg/dL (ref 8.9–10.3)
GFR calc non Af Amer: >60 mL/min (ref >60)
Glucose, Bld: 95 mg/dL (ref 65–99)
POTASSIUM: 4 mmol/L (ref 3.5–5.1)
SODIUM: 140 mmol/L (ref 135–145)

## 2017-02-16 LAB — IRON AND TIBC
IRON: 27 ug/dL — AB (ref 28–170)
SATURATION RATIOS: 6 % — AB (ref 10.4–31.8)
TIBC: 437 ug/dL (ref 250–450)
UIBC: 410 ug/dL

## 2017-02-16 LAB — FERRITIN: FERRITIN: 20 ng/mL (ref 11–307)

## 2017-02-16 MED ORDER — ENOXAPARIN SODIUM 40 MG/0.4ML ~~LOC~~ SOLN
40.0000 mg | SUBCUTANEOUS | Status: DC
  Administered 2017-02-16 – 2017-02-24 (×8): 40 mg via SUBCUTANEOUS
  Filled 2017-02-16 (×8): qty 0.4

## 2017-02-16 MED ORDER — DIAZEPAM 5 MG PO TABS
5.0000 mg | ORAL_TABLET | Freq: Three times a day (TID) | ORAL | Status: DC | PRN
  Administered 2017-02-16 – 2017-03-11 (×24): 5 mg via ORAL
  Filled 2017-02-16 (×26): qty 1

## 2017-02-16 MED ORDER — LUBIPROSTONE 24 MCG PO CAPS
24.0000 ug | ORAL_CAPSULE | Freq: Every day | ORAL | Status: DC
  Administered 2017-02-17 – 2017-03-12 (×23): 24 ug via ORAL
  Filled 2017-02-16 (×27): qty 1

## 2017-02-16 NOTE — Progress Notes (Signed)
PROGRESS NOTE    [name]  ZOX:[id number] DOB: 05/09/1980 DOA: 02/13/2017 PCP: Lennie Odor, PA-C   Brief Narrative: 36 year old female with history of multiple sclerosis, transverse myelitis, paraplegic, bedbound with chronic sacral decubitus ulcer undergoing outpatient wound care presented to the hospital after noticing that the wound is getting bigger associated with foul-smelling discharge.  Assessment & Plan:   #Chronic sacral decubitus ulcer, unstageable with possible early osteomyelitis: -Patient has foul-smelling discharge on examination.  MRI concerning with nonspecific muscle edema and early osteomyelitis.  Evaluated by wound care nurse.  Patient likely needs debridement and tissue for culture.  Patient has no fever, no leukocytosis, ESR normal with a mildly elevated CRP.  I have consulted general surgeon and discussed with Dr.Matt Tsuei. -Also consulted infectious disease and discussed with Dr. Tommy Medal.  No plan for antibiotics until tissue sample sent for the culture since patient is not septic. -Continue supportive care.  #History of multiple sclerosis/transverse myelitis and paraplegia: Patient is wheelchair-bound and has urostomy.  Continue supportive care. -Added home dose of Valium for muscle spasm.  #Constipation: Resume home medication for bowel regimen.  #Hyponatremia: Serum potassium level acceptable today.  #Chronic microcytic anemia: Check iron stores.  Hemoglobin is stable.  DVT prophylaxis: Lovenox subcutaneous Code Status: Full code Family Communication: Discussed with the patient's mother at bedside Disposition Plan: Currently admitted    Consultants:   General surgery  Infectious disease  Procedures: MRI Antimicrobials: None  Subjective: Seen and examined at bedside.  Patient with pain and follow smelling discharge from the wound.  Mother at bedside.  Denies fever, chills, nausea, vomiting.  Objective: Vitals:   02/14/17 2113  02/15/17 0656 02/15/17 1407 02/15/17 2300  BP: (!) 98/56 109/69 109/65 104/65  Pulse: 99 93 88 94  Resp: _0 Temp: 99.7 F (37.6 C) 98.7 F (37.1 C) 98.9 F (37.2 C) 97.8 F (36.6 C)  TempSrc: Oral Oral Oral Oral  SpO2: 100% 100% 100% 98%  Weight:      Height:        Intake/Output Summary (Last 24 hours) at 02/16/2017 1241 Last data filed at 02/16/2017 0823 Gross per 24 hour  Intake 780 ml  Output 975 ml  Net -195 ml   Filed Weights   02/14/17 0057  Weight: 59.7 kg (131 lb 9.8 oz)    Examination:  General exam: Appears calm and comfortable  Respiratory system: Clear to auscultation. Respiratory effort normal. No wheezing or crackle Cardiovascular system: S1 & S2 heard, RRR.  No pedal edema. Gastrointestinal system: Abdomen is nondistended, soft and nontender. Normal bowel sounds heard. Central nervous system: Alert awake and paraplegic. Extremities: Paraplegia Skin: Sacral deep wound with foul-smelling discharge and dressing applied on top of it. Psychiatry: Judgement and insight appear normal. Mood & affect appropriate.     Data Reviewed: I have personally reviewed following labs and imaging studies  CBC: Recent Labs  Lab 02/13/17 1935 02/15/17 0521  WBC 7.7 6.3  NEUTROABS  --  4.5  HGB 9.3* 9.2*  HCT 32.3* 32.7*  MCV 71.1* 72.0*  PLT 179 811   Basic Metabolic Panel: Recent Labs  Lab 02/13/17 1935 02/15/17 0521 02/16/17 0649  NA 138 142 140  K 3.7 3.2* 4.0  CL 106 104 103  CO2 _1 GLUCOSE 91 94 95  BUN _2 CREATININE 0.31* 0.44 0.32*  CALCIUM 8.8* 9.1 9.3  MG  --  2.1  --    GFR: Estimated  Creatinine Clearance: 80.4 mL/min (A) (by C-G formula based on SCr of 0.32 mg/dL (L)). Liver Function Tests: Recent Labs  Lab 02/13/17 1935  AST 16  ALT 11*  ALKPHOS 110  BILITOT 0.6  PROT 7.0  ALBUMIN 4.2   No results for input(s): LIPASE, AMYLASE in the last 168 hours. No results for input(s): AMMONIA in the last 168  hours. Coagulation Profile: No results for input(s): INR, PROTIME in the last 168 hours. Cardiac Enzymes: No results for input(s): CKTOTAL, CKMB, CKMBINDEX, TROPONINI in the last 168 hours. BNP (last 3 results) No results for input(s): PROBNP in the last 8760 hours. HbA1C: No results for input(s): HGBA1C in the last 72 hours. CBG: No results for input(s): GLUCAP in the last 168 hours. Lipid Profile: No results for input(s): CHOL, HDL, LDLCALC, TRIG, CHOLHDL, LDLDIRECT in the last 72 hours. Thyroid Function Tests: No results for input(s): TSH, T4TOTAL, FREET4, T3FREE, THYROIDAB in the last 72 hours. Anemia Panel: No results for input(s): VITAMINB12, FOLATE, FERRITIN, TIBC, IRON, RETICCTPCT in the last 72 hours. Sepsis Labs: Recent Labs  Lab 02/15/17 0521  LATICACIDVEN 0.8    Recent Results (from the past 240 hour(s))  Wet prep, genital     Status: Abnormal   Collection Time: 02/13/17  6:38 PM  Result Value Ref Range Status   Yeast Wet Prep HPF POC NONE SEEN NONE SEEN Final   Trich, Wet Prep NONE SEEN NONE SEEN Final   Clue Cells Wet Prep HPF POC NONE SEEN NONE SEEN Final   WBC, Wet Prep HPF POC MODERATE (A) NONE SEEN Final   Sperm NONE SEEN  Final  MRSA PCR Screening     Status: None   Collection Time: 02/14/17 11:20 AM  Result Value Ref Range Status   MRSA by PCR NEGATIVE NEGATIVE Final    Comment:        The GeneXpert MRSA Assay (FDA approved for NASAL specimens only), is one component of a comprehensive MRSA colonization surveillance program. It is not intended to diagnose MRSA infection nor to guide or monitor treatment for MRSA infections.          Radiology Studies: Mr Pelvis W Wo Contrast  Result Date: 02/16/2017 CLINICAL DATA:  Perirectal and left ischial decubitus ulcer. Evaluate for osteomyelitis. EXAM: MRI PELVIS WITHOUT AND WITH CONTRAST TECHNIQUE: Multiplanar multisequence MR imaging of the pelvis was performed both before and after administration  of intravenous contrast. CONTRAST:  106m MULTIHANCE GADOBENATE DIMEGLUMINE 529 MG/ML IV SOLN COMPARISON:  CT pelvis dated February 13, 2017. FINDINGS: Bones/Joint/Cartilage No acute fracture or malalignment. There is subtle cortical irregularity of the left ischial tuberosity. No definite marrow edema. No hip joint effusion. Muscles and Tendons The bilateral iliopsoas, gluteal, and hamstring tendons are unremarkable. Nonspecific edema within the right greater than left gluteus medius and minimus musculature, and the bilateral adductor musculature. Edema also involves the bilateral iliacus muscles in the lower pelvis. Soft tissues Soft tissue ulceration over the left ischial tuberosity extending to the bone. Right lower quadrant ileostomy again noted. Wall thickening and enhancement of the rectum. IMPRESSION: 1. Deep soft tissue ulcer extending to the left ischial tuberosity. Although there is no abnormal marrow signal in the ischial tuberosity, there is subtle cortical irregularity, concerning for early osteomyelitis. 2. Nonspecific muscle edema in the bilateral gluteus, adductor, and iliacus musculature. 3. Wall thickening and enhancement of the rectum. Correlate for proctitis. Electronically Signed   By: WTitus DubinM.D.   On: 02/16/2017 10:00  Scheduled Meds: . baclofen  20 mg Oral TID  . bisacodyl  10 mg Rectal Daily  . gabapentin  800 mg Oral TID  . lactose free nutrition  237 mL Oral TID WC  . lamoTRIgine  125 mg Oral Daily  . lubiprostone  24 mcg Oral Q breakfast  . multivitamin  15 mL Oral Daily  . pantoprazole  20 mg Oral Daily  . senna-docusate  1 tablet Oral BID  . sertraline  100 mg Oral Daily  . sodium chloride flush  3 mL Intravenous Q12H   Continuous Infusions: . sodium chloride       LOS: 0 days    Cherisa Brucker Tanna Furry, MD [facility] Pager [phone]  If 7PM-7AM, please contact night-coverage [email or web address] Password [email or web address] 02/16/2017, 12:41 PM

## 2017-02-16 NOTE — Care Management Note (Addendum)
Case Management Note  Patient Details  Name: [name] MRN: [id number] Date of Birth: June 13, 1980  Subjective/Objective:     MS, paraplegic, transverse myelitis, bedbound with chronic sacral decubitis with possible osteomyelitis, per attending notes plan is for I&D with wound culture pending date, and possible long term IV ABX               Action/Plan: Discharge Planning:  Please see previous NCM notes. Pt lives at home with mother who care for her. She is active with [facility] for [facility].  Will continue to follow for dc needs. Will need HH RN, aide orders with F2F, instructions on wound care.    PCP Lennie Odor MD  Expected Discharge Date:  02/15/17               Expected Discharge Plan:  [place]  In-House Referral:  NA  Discharge planning Services  CM Consult  Post Acute Care Choice:  NA Choice offered to:  Patient  DME Arranged:  N/A DME Agency:  NA  HH Arranged:  RN, Disease Management, Nurse's Aide [place] Agency:  [place]  Status of Service:  In process, will continue to follow  If discussed at Long Length of Stay Meetings, dates discussed:    Additional Comments:  Erenest Rasher, RN 02/16/2017, 2:47 PM

## 2017-02-17 DIAGNOSIS — M869 Osteomyelitis, unspecified: Secondary | ICD-10-CM

## 2017-02-17 DIAGNOSIS — L89154 Pressure ulcer of sacral region, stage 4: Secondary | ICD-10-CM

## 2017-02-17 DIAGNOSIS — A4902 Methicillin resistant Staphylococcus aureus infection, unspecified site: Secondary | ICD-10-CM

## 2017-02-17 DIAGNOSIS — T8130XA Disruption of wound, unspecified, initial encounter: Secondary | ICD-10-CM

## 2017-02-17 NOTE — Progress Notes (Addendum)
PROGRESS NOTE    [name]  DJS:[id number] DOB: 1980/11/03 DOA: 02/13/2017 PCP: Lennie Odor, PA-C   Brief Narrative: 36 year old female with history of multiple sclerosis, transverse myelitis, paraplegic, bedbound with chronic sacral decubitus ulcer undergoing outpatient wound care presented to the hospital after noticing that the wound is getting bigger associated with foul-smelling discharge.  Assessment & Plan:   #Chronic sacral decubitus ulcer, unstageable with possible early osteomyelitis: -Patient has foul-smelling discharge on examination.  MRI concerning with nonspecific muscle edema and early osteomyelitis.  Evaluated by wound care nurse.  Patient likely needs debridement and tissue for culture.  Patient has no fever, no leukocytosis, ESR normal with a mildly elevated CRP.   I have consulted general surgeon and ID on 02/16/2017. I again discussed with Surgeon Dr. Verita Lamb today. He said patient will be  Seen tomorrow.  -as per Dr. Tommy Medal, decision about antibiotics after possible debridement and biopsy sample sent for the culture.  -Now I am waiting for consultant's for their evaluation and recommendation. I relayed this message to the patient and her mother who was at bedside. I will keep patient NPO from midnight in case surgery team plan for debridement tomorrow.  #History of multiple sclerosis/transverse myelitis and paraplegia: Patient is wheelchair-bound and has urostomy.  Continue supportive care. -Added home dose of Valium for muscle spasm.  #Constipation: Resume home medication for bowel regimen.  #Hypokalemia: Serum potassium level acceptable today.  #Chronic microcytic anemia/iron deficiency anemia: We will not restart iron since there is concern of infection.  Monitor CBC.  DVT prophylaxis: Lovenox subcutaneous Code Status: Full code Family Communication: Discussed with the patient's mother at bedside Disposition Plan: Currently  admitted    Consultants:   General surgery  Infectious disease  Procedures: MRI Antimicrobials: None  Subjective: Seen and examined at bedside.  No new event.  Denies headache, dizziness, nausea vomiting chest pain shortness of breath.  Mother at bedside.  Objective: Vitals:   02/15/17 2300 02/16/17 1405 02/16/17 2230 02/17/17 0623  BP: 104/65 112/74 108/74 110/79  Pulse: 94 94 89 92  Resp: 18 18 18 18   Temp: 97.8 F (36.6 C) 99.4 F (37.4 C) 98.6 F (37 C) 98.6 F (37 C)  TempSrc: Oral Oral Oral Oral  SpO2: 98% 99% 98% 97%  Weight:      Height:        Intake/Output Summary (Last 24 hours) at 02/17/2017 1030 Last data filed at 02/17/2017 0957 Gross per 24 hour  Intake 420 ml  Output 1450 ml  Net -1030 ml   Filed Weights   02/14/17 0057  Weight: 59.7 kg (131 lb 9.8 oz)    Examination:  General exam: Not in distress, lying in bed comfortable Respiratory system: Clear bilateral, respiratory effort normal Cardiovascular system: Regular rate rhythm, S1-S2 normal.. Gastrointestinal system: Abdomen is nondistended, soft and nontender. Normal bowel sounds heard. Central nervous system: Alert awake and paraplegic. Extremities: Paraplegia Skin: Sacral deep wound with foul-smelling discharge and dressing applied on top of it, examined on 11/17. Psychiatry: Judgement and insight appear normal. Mood & affect appropriate.     Data Reviewed: I have personally reviewed following labs and imaging studies  CBC: Recent Labs  Lab 02/13/17 1935 02/15/17 0521  WBC 7.7 6.3  NEUTROABS  --  4.5  HGB 9.3* 9.2*  HCT 32.3* 32.7*  MCV 71.1* 72.0*  PLT 179 885   Basic Metabolic Panel: Recent Labs  Lab 02/13/17 1935 02/15/17 0521 02/16/17 0649  NA 138 142  140  K 3.7 3.2* 4.0  CL 106 104 103  CO2 25 29 27   GLUCOSE 91 94 95  BUN 11 9 10   CREATININE 0.31* 0.44 0.32*  CALCIUM 8.8* 9.1 9.3  MG  --  2.1  --    GFR: Estimated Creatinine Clearance: 80.4 mL/min (A)  (by C-G formula based on SCr of 0.32 mg/dL (L)). Liver Function Tests: Recent Labs  Lab 02/13/17 1935  AST 16  ALT 11*  ALKPHOS 110  BILITOT 0.6  PROT 7.0  ALBUMIN 4.2   No results for input(s): LIPASE, AMYLASE in the last 168 hours. No results for input(s): AMMONIA in the last 168 hours. Coagulation Profile: No results for input(s): INR, PROTIME in the last 168 hours. Cardiac Enzymes: No results for input(s): CKTOTAL, CKMB, CKMBINDEX, TROPONINI in the last 168 hours. BNP (last 3 results) No results for input(s): PROBNP in the last 8760 hours. HbA1C: No results for input(s): HGBA1C in the last 72 hours. CBG: No results for input(s): GLUCAP in the last 168 hours. Lipid Profile: No results for input(s): CHOL, HDL, LDLCALC, TRIG, CHOLHDL, LDLDIRECT in the last 72 hours. Thyroid Function Tests: No results for input(s): TSH, T4TOTAL, FREET4, T3FREE, THYROIDAB in the last 72 hours. Anemia Panel: Recent Labs    02/16/17 1435  FERRITIN 20  TIBC 437  IRON 27*   Sepsis Labs: Recent Labs  Lab 02/15/17 0521  LATICACIDVEN 0.8    Recent Results (from the past 240 hour(s))  Wet prep, genital     Status: Abnormal   Collection Time: 02/13/17  6:38 PM  Result Value Ref Range Status   Yeast Wet Prep HPF POC NONE SEEN NONE SEEN Final   Trich, Wet Prep NONE SEEN NONE SEEN Final   Clue Cells Wet Prep HPF POC NONE SEEN NONE SEEN Final   WBC, Wet Prep HPF POC MODERATE (A) NONE SEEN Final   Sperm NONE SEEN  Final  MRSA PCR Screening     Status: None   Collection Time: 02/14/17 11:20 AM  Result Value Ref Range Status   MRSA by PCR NEGATIVE NEGATIVE Final    Comment:        The GeneXpert MRSA Assay (FDA approved for NASAL specimens only), is one component of a comprehensive MRSA colonization surveillance program. It is not intended to diagnose MRSA infection nor to guide or monitor treatment for MRSA infections.          Radiology Studies: Mr Pelvis W Wo  Contrast  Result Date: 02/16/2017 CLINICAL DATA:  Perirectal and left ischial decubitus ulcer. Evaluate for osteomyelitis. EXAM: MRI PELVIS WITHOUT AND WITH CONTRAST TECHNIQUE: Multiplanar multisequence MR imaging of the pelvis was performed both before and after administration of intravenous contrast. CONTRAST:  109m MULTIHANCE GADOBENATE DIMEGLUMINE 529 MG/ML IV SOLN COMPARISON:  CT pelvis dated February 13, 2017. FINDINGS: Bones/Joint/Cartilage No acute fracture or malalignment. There is subtle cortical irregularity of the left ischial tuberosity. No definite marrow edema. No hip joint effusion. Muscles and Tendons The bilateral iliopsoas, gluteal, and hamstring tendons are unremarkable. Nonspecific edema within the right greater than left gluteus medius and minimus musculature, and the bilateral adductor musculature. Edema also involves the bilateral iliacus muscles in the lower pelvis. Soft tissues Soft tissue ulceration over the left ischial tuberosity extending to the bone. Right lower quadrant ileostomy again noted. Wall thickening and enhancement of the rectum. IMPRESSION: 1. Deep soft tissue ulcer extending to the left ischial tuberosity. Although there is no abnormal marrow signal in  the ischial tuberosity, there is subtle cortical irregularity, concerning for early osteomyelitis. 2. Nonspecific muscle edema in the bilateral gluteus, adductor, and iliacus musculature. 3. Wall thickening and enhancement of the rectum. Correlate for proctitis. Electronically Signed   By: Titus Dubin M.D.   On: 02/16/2017 10:00        Scheduled Meds: . baclofen  20 mg Oral TID  . bisacodyl  10 mg Rectal Daily  . enoxaparin (LOVENOX) injection  40 mg Subcutaneous Q24H  . gabapentin  800 mg Oral TID  . lactose free nutrition  237 mL Oral TID WC  . lamoTRIgine  125 mg Oral Daily  . lubiprostone  24 mcg Oral Q breakfast  . multivitamin  15 mL Oral Daily  . pantoprazole  20 mg Oral Daily  . senna-docusate   1 tablet Oral BID  . sertraline  100 mg Oral Daily  . sodium chloride flush  3 mL Intravenous Q12H   Continuous Infusions: . sodium chloride       LOS: 1 day    Conner Muegge Tanna Furry, MD [facility] Pager [phone]  If 7PM-7AM, please contact night-coverage [email or web address] Password [email or web address] 02/17/2017, 10:30 AM

## 2017-02-17 NOTE — Evaluation (Addendum)
Occupational Therapy Evaluation and Discharge Patient Details Name: [name] MRN: [id number] DOB: May 16, 1980 Today's Date: 02/17/2017    History of Present Illness 36 y.o. female with medical history significant of MS, tranverse myelitis  bedbound state with decub wound that has been healing well for the past year comes in after her mom noticing the wound has gotten bigger in the last day or so.  She only has wound care nurse come out to her home once a month.  No fevers.  No n/v/d.  Pt referred for admission for possible need of wound debriedement.   Clinical Impression   Pt requires max assist with ADL at bed level at baseline. Currently pt requires mod assist with UB ADL and total assist with LB ADL. Pt able to roll in bed with mod I to assist with cleaning following incontinent BM. Pt and mother report they have all necessary equipment at home but do have difficulty with transfers as pts mother picks her up and transfers her EOB>w/c with total assist. Discussed use of hoyer lift to assist with transfers; pts mother reports they have been evaluated and house is not large enough for lift equipment. Pt planning to d/c home with 24/7 supervision from family. No further acute OT needs identified; signing off at this time. Please re-consult if needs change. Thank you for this referral.    Follow Up Recommendations  No OT follow up;Supervision/Assistance - 24 hour    Equipment Recommendations  Other (comment)(hoyer lift, SCDs)    Recommendations for Other Services       Precautions / Restrictions Precautions Precautions: Fall Restrictions Weight Bearing Restrictions: No      Mobility Bed Mobility Overal bed mobility: Needs Assistance Bed Mobility: Rolling Rolling: Modified independent (Device/Increase time)         General bed mobility comments: Able to roll with HOB flat and use of bed rails without assist  Transfers                      Balance                                            ADL either performed or assessed with clinical judgement   ADL Overall ADL's : Needs assistance/impaired Eating/Feeding: Set up;Bed level   Grooming: Minimal assistance;Bed level   Upper Body Bathing: Moderate assistance;Bed level   Lower Body Bathing: Total assistance;Bed level   Upper Body Dressing : Minimal assistance;Bed level Upper Body Dressing Details (indicate cue type and reason): to doff/don gown Lower Body Dressing: Total assistance;Bed level                 General ADL Comments: Pt with incontient BM upon arrival; assisted with cleaning at bed level. Pt and mom report they have all equipment at home but have difficulty with transfers; discussed hoyer lift, mom reports they attemtpted lift but house is not big enough to accomodate equipment.     Vision         Perception     Praxis      Pertinent Vitals/Pain Pain Assessment: Faces Faces Pain Scale: Hurts a little bit Pain Location: generalized Pain Intervention(s): Monitored during session     Hand Dominance     Extremity/Trunk Assessment Upper Extremity Assessment Upper Extremity Assessment: Overall WFL for tasks assessed   Lower Extremity Assessment Lower Extremity  Assessment: Defer to PT evaluation       Communication Communication Communication: No difficulties   Cognition Arousal/Alertness: Awake/alert Behavior During Therapy: WFL for tasks assessed/performed Overall Cognitive Status: Within Functional Limits for tasks assessed                                     General Comments       Exercises     Shoulder Instructions      Home Living Family/patient expects to be discharged to:: Private residence Living Arrangements: Parent Available Help at Discharge: Family;Available 24 hours/day Type of Home: House Home Access: Ramped entrance     Home Layout: One level     Bathroom Shower/Tub: Other (comment)(sponge  bathes at bed level)   Bathroom Toilet: (incontinent)     Home Equipment: Bedside commode;Shower seat;Grab bars - toilet;Grab bars - tub/shower;Hand held shower head;Wheelchair - power;Hospital bed          Prior Functioning/Environment Level of Independence: Needs assistance  Gait / Transfers Assistance Needed: mother transfers pt to w/c only if they have an appointment that day, otherwise pt stays in bed. ADL's / Homemaking Assistance Needed: mother assists with ADL at bed level            OT Problem List:        OT Treatment/Interventions:      OT Goals(Current goals can be found in the care plan section) Acute Rehab OT Goals Patient Stated Goal: return home OT Goal Formulation: All assessment and education complete, DC therapy  OT Frequency:     Barriers to D/C:            Co-evaluation              AM-PAC PT "6 Clicks" Daily Activity     Outcome Measure Help from another person eating meals?: A Little Help from another person taking care of personal grooming?: A Little Help from another person toileting, which includes using toliet, bedpan, or urinal?: Total Help from another person bathing (including washing, rinsing, drying)?: A Lot Help from another person to put on and taking off regular upper body clothing?: A Lot Help from another person to put on and taking off regular lower body clothing?: Total 6 Click Score: 12   End of Session Nurse Communication: Mobility status;Other (comment)(pt with BM; removed dressing on wound, need to assess)  Activity Tolerance: Patient tolerated treatment well Patient left: in bed;with call bell/phone within reach;with nursing/sitter in room;with family/visitor present;with SCD's reapplied  OT Visit Diagnosis: Other abnormalities of gait and mobility (R26.89)                Time: 1351-1410 OT Time Calculation (min): 19 min Charges:  OT General Charges $OT Visit: 1 Visit OT Evaluation $OT Eval Moderate  Complexity: 1 Mod G-Codes:     Illana Nolting A. Ulice Brilliant, M.S., OTR/L Pager: [place] 02/17/2017, 2:16 PM

## 2017-02-17 NOTE — Consult Note (Signed)
Date of Admission:  02/13/2017          Reason for Consult: H for decubitus ulcer with increased drainage and now osteomyelitis     Referring Provider: Dr. Carolin Sicks  Assessment:  1. Worsening drainage from stage IV decubitus ulcer 2. 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 HOLD antibiotics 2. Consult Surgery vs Plastics for Debridement of soft tissue, muscle and if appropriate bone 3. PLEASE SEND INTRA-OPERATIVE DEEP SPECIMENS THEMSELVES NOT SWABS to microbiology lab for aerobic/anerobic cultures 4. I am concerned she is not getting proper offloading at home  Principal Problem:   Decubitus ulcer Active Problems:   Paraplegia ([place])   History of transverse myelitis   Wound disruption   Scheduled Meds: . baclofen  20 mg Oral TID  . bisacodyl  10 mg Rectal Daily  . enoxaparin (LOVENOX) injection  40 mg Subcutaneous Q24H  . gabapentin  800 mg Oral TID  . lactose free nutrition  237 mL Oral TID WC  . lamoTRIgine  125 mg Oral Daily  . lubiprostone  24 mcg Oral Q breakfast  . multivitamin  15 mL Oral Daily  . pantoprazole  20 mg Oral Daily  . senna-docusate  1 tablet Oral BID  . sertraline  100 mg Oral Daily  . sodium chloride flush  3 mL Intravenous Q12H   Continuous Infusions: . sodium chloride     PRN Meds:.sodium chloride, diazepam, ibuprofen, ondansetron (ZOFRAN) IV, sodium chloride flush  HPI: [name] is a 36 y.o. 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 Megan Salon and I took care of roughly a year ago.  During an admission in November 2017 she also had a stage IV decubitus ulcer which at the time showed osteomyelitis involving her acetabulum and extending into inferior pubic ramus and ischium.  She underwent CT-guided biopsy of the left ischial ramus which was sent for cultures.  Unfortunately this did not yield any organisms and she needed to be treated with empiric  broad-spectrum antibiotics.  She completed a course of daptomycin and ceftriaxone intravenously and was seen by my partner Dr. Megan Salon in follow-up.  She seemed to have healed up well and ultimately was taken off antibiotics in late January.  She has been followed closely in the wound care center and there are multiple cultures that I can see in epic of wound cultures attentionally of deeper specimens.  These do include MRSA which has been isolated several times.  She presented to the hospital after a wound care nurse who been following her closely at home was concerned about worsening drainage from her wound and increase in the circumference of her wound.  She was admitted to the hospitalist service and wound care were consulted.  To my great surprise and pleasure I can see that she was not started on antibiotics which is very helpful since obtaining deep cultures off of antibiotics will help [place] give her more targeted antimicrobial therapy.  She is certainly clinically stable at present.  I recommend consultation with general surgery versus plastic surgery for further debridement of her infected soft tissues deeper tissues and if possible bone all of which can be sent for culture.  Note please do not send swabs to the microbiology lab as these are much lower yield than actual tissue and pus.     Review of Systems: Review of Systems  Constitutional: Positive for malaise/fatigue. Negative for chills, diaphoresis,  fever and weight loss.  HENT: Negative for congestion, hearing loss, sore throat and tinnitus.   Eyes: Negative for blurred vision and double vision.  Respiratory: Negative for cough, sputum production, shortness of breath and wheezing.   Cardiovascular: Negative for chest pain, palpitations and leg swelling.  Gastrointestinal: Positive for constipation. Negative for abdominal pain, blood in stool, diarrhea, heartburn, melena, nausea and vomiting.  Genitourinary: Negative for dysuria,  flank pain and hematuria.  Musculoskeletal: Negative for back pain, falls, joint pain and myalgias.  Skin: Negative for itching and rash.  Neurological: Positive for focal weakness and weakness. Negative for dizziness, sensory change, loss of consciousness and headaches.  Endo/Heme/Allergies: Does not bruise/bleed easily.  Psychiatric/Behavioral: Negative for depression, memory loss and suicidal ideas. The patient is not nervous/anxious.     Past Medical History:  Diagnosis Date  . Bladder stones    Hx of  . Depression   . GERD (gastroesophageal reflux disease)   . Multiple sclerosis ([place])   . Paraplegia ([place])   . PTSD (post-traumatic stress disorder)   . Transverse myelitis ([place])     Social History   Tobacco Use  . Smoking status: Never Smoker  . Smokeless tobacco: Never Used  Substance Use Topics  . Alcohol use: No  . Drug use: No    Family History  Problem Relation Age of Onset  . Diabetes type II Father   . CAD Father   . Dementia Father   . Stroke Father   . Seizures Father   . Diabetes type II Mother   . Hypertension Mother   . Breast cancer Other   . Diabetes type II Other    Allergies  Allergen Reactions  . Iodine Anaphylaxis  . Latex Anaphylaxis  . Other Anaphylaxis  . Shellfish-Derived Products Anaphylaxis    Reaction unknown  . Vancomycin Anaphylaxis  . Aspirin Other (See Comments)    Reaction unknown  . Strawberry Extract Hives and Rash    OBJECTIVE: Blood pressure 100/66, pulse 92, temperature 98.6 F (37 C), temperature source Oral, resp. rate 16, height 5\' 3"  (1.6 m), weight 131 lb 9.8 oz (59.7 kg), SpO2 100 %.  Physical Exam  Constitutional: She is oriented to person, place, and time and well-developed, well-nourished, and in no distress. No distress.  HENT:  Head: Normocephalic and atraumatic.  Right Ear: External ear normal.  Left Ear: External ear normal.  Nose: Nose normal.  Mouth/Throat: Oropharynx is clear and moist. No  oropharyngeal exudate.  Eyes: Conjunctivae and EOM are normal. No scleral icterus.  Neck: Normal range of motion. Neck supple.  Cardiovascular: Normal rate, regular rhythm and normal heart sounds. Exam reveals no gallop and no friction rub.  No murmur heard. Pulmonary/Chest: Effort normal and breath sounds normal. No respiratory distress. She has no wheezes. She has no rales.  Abdominal: Soft. Bowel sounds are normal. She exhibits no distension. There is no tenderness. There is no rebound.  Musculoskeletal: Normal range of motion. She exhibits no edema or tenderness.  Lymphadenopathy:    She has no cervical adenopathy.  Neurological: She is alert and oriented to person, place, and time. She exhibits abnormal muscle tone. Coordination normal.  paraplegic  Skin: Skin is warm and dry. No rash noted. She is not diaphoretic. There is erythema. There is pallor.  Psychiatric: Mood, memory, affect and judgment normal.   Skin decubitus ulcer    02/17/17:    02/13/17:            Lab Results  Lab Results  Component Value Date   WBC 6.3 02/15/2017   HGB 9.2 (L) 02/15/2017   HCT 32.7 (L) 02/15/2017   MCV 72.0 (L) 02/15/2017   PLT 191 02/15/2017    Lab Results  Component Value Date   CREATININE 0.32 (L) 02/16/2017   BUN 10 02/16/2017   NA 140 02/16/2017   K 4.0 02/16/2017   CL 103 02/16/2017   CO2 27 02/16/2017    Lab Results  Component Value Date   ALT 11 (L) 02/13/2017   AST 16 02/13/2017   ALKPHOS 110 02/13/2017   BILITOT 0.6 02/13/2017     Microbiology: Recent Results (from the past 240 hour(s))  Wet prep, genital     Status: Abnormal   Collection Time: 02/13/17  6:38 PM  Result Value Ref Range Status   Yeast Wet Prep HPF POC NONE SEEN NONE SEEN Final   Trich, Wet Prep NONE SEEN NONE SEEN Final   Clue Cells Wet Prep HPF POC NONE SEEN NONE SEEN Final   WBC, Wet Prep HPF POC MODERATE (A) NONE SEEN Final   Sperm NONE SEEN  Final  MRSA PCR Screening     Status:  None   Collection Time: 02/14/17 11:20 AM  Result Value Ref Range Status   MRSA by PCR NEGATIVE NEGATIVE Final    Comment:        The GeneXpert MRSA Assay (FDA approved for NASAL specimens only), is one component of a comprehensive MRSA colonization surveillance program. It is not intended to diagnose MRSA infection nor to guide or monitor treatment for MRSA infections.     Alcide Evener, [place] [facility] [phone] pager   [phone] cell 02/17/2017, 2:01 PM

## 2017-02-18 ENCOUNTER — Encounter (HOSPITAL_COMMUNITY): Payer: Self-pay | Admitting: Certified Registered Nurse Anesthetist

## 2017-02-18 ENCOUNTER — Ambulatory Visit: Payer: Medicaid Other | Admitting: Obstetrics & Gynecology

## 2017-02-18 ENCOUNTER — Inpatient Hospital Stay (HOSPITAL_COMMUNITY): Payer: Medicaid Other | Admitting: Certified Registered Nurse Anesthetist

## 2017-02-18 ENCOUNTER — Encounter (HOSPITAL_COMMUNITY)
Admission: EM | Disposition: A | Discharge status: Home / Self Care | Payer: Self-pay | Source: Home / Self Care | Attending: Nephrology

## 2017-02-18 HISTORY — PX: IRRIGATION AND DEBRIDEMENT ABSCESS: SHX5252

## 2017-02-18 LAB — CK: CK TOTAL: 21 U/L — AB (ref 38–234)

## 2017-02-18 SURGERY — IRRIGATION AND DEBRIDEMENT ABSCESS
Anesthesia: General

## 2017-02-18 MED ORDER — SODIUM CHLORIDE 0.9 % IV SOLN: 250.0000 mL | INTRAVENOUS | Status: DC | PRN

## 2017-02-18 MED ORDER — LACTATED RINGERS IV SOLN
INTRAVENOUS | Status: DC
  Administered 2017-02-18: 11:00:00 via INTRAVENOUS

## 2017-02-18 MED ORDER — DEXTROSE 5 % IV SOLN
2.0000 g | Freq: Three times a day (TID) | INTRAVENOUS | Status: DC
  Administered 2017-02-18 – 2017-02-20 (×6): 2 g via INTRAVENOUS
  Filled 2017-02-18 (×7): qty 2

## 2017-02-18 MED ORDER — CHLORHEXIDINE GLUCONATE 4 % EX LIQD: 1.0000 "application " | Freq: Once | CUTANEOUS | Status: DC

## 2017-02-18 MED ORDER — PROPOFOL 10 MG/ML IV BOLUS
INTRAVENOUS | Status: AC
  Filled 2017-02-18: qty 20

## 2017-02-18 MED ORDER — KCL IN DEXTROSE-NACL 20-5-0.9 MEQ/L-%-% IV SOLN
INTRAVENOUS | Status: DC
  Administered 2017-02-18: 19:00:00 via INTRAVENOUS
  Filled 2017-02-18 (×2): qty 1000

## 2017-02-18 MED ORDER — PROMETHAZINE HCL 25 MG/ML IJ SOLN: 6.2500 mg | INTRAMUSCULAR | Status: DC | PRN

## 2017-02-18 MED ORDER — PANTOPRAZOLE SODIUM 20 MG PO TBEC
20.0000 mg | DELAYED_RELEASE_TABLET | Freq: Every day | ORAL | Status: DC
Start: 2017-02-19 — End: 2017-03-12
  Administered 2017-02-19 – 2017-03-12 (×22): 20 mg via ORAL
  Filled 2017-02-18 (×23): qty 1

## 2017-02-18 MED ORDER — HYDROMORPHONE HCL 1 MG/ML IJ SOLN
INTRAMUSCULAR | Status: AC
  Filled 2017-02-18: qty 2

## 2017-02-18 MED ORDER — OXYCODONE HCL 5 MG/5ML PO SOLN: 5.0000 mg | Freq: Once | ORAL | Status: DC | PRN

## 2017-02-18 MED ORDER — SERTRALINE HCL 100 MG PO TABS
100.0000 mg | ORAL_TABLET | Freq: Every day | ORAL | Status: DC
  Administered 2017-02-18 – 2017-03-12 (×23): 100 mg via ORAL
  Filled 2017-02-18 (×23): qty 1

## 2017-02-18 MED ORDER — HYDROMORPHONE HCL 1 MG/ML IJ SOLN: 0.2500 mg | INTRAMUSCULAR | Status: DC | PRN

## 2017-02-18 MED ORDER — BACLOFEN 20 MG PO TABS
20.0000 mg | ORAL_TABLET | Freq: Three times a day (TID) | ORAL | Status: DC
  Administered 2017-02-18 – 2017-03-12 (×64): 20 mg via ORAL
  Filled 2017-02-18 (×65): qty 1

## 2017-02-18 MED ORDER — LIDOCAINE 2% (20 MG/ML) 5 ML SYRINGE
INTRAMUSCULAR | Status: AC
  Filled 2017-02-18: qty 5

## 2017-02-18 MED ORDER — DEXAMETHASONE SODIUM PHOSPHATE 10 MG/ML IJ SOLN
INTRAMUSCULAR | Status: DC | PRN
  Administered 2017-02-18: 4 mg via INTRAVENOUS

## 2017-02-18 MED ORDER — ONDANSETRON HCL 4 MG/2ML IJ SOLN
INTRAMUSCULAR | Status: DC | PRN
  Administered 2017-02-18: 4 mg via INTRAVENOUS

## 2017-02-18 MED ORDER — EPHEDRINE SULFATE 50 MG/ML IJ SOLN
INTRAMUSCULAR | Status: DC | PRN
  Administered 2017-02-18: 10 mg via INTRAVENOUS
  Administered 2017-02-18: 15 mg via INTRAVENOUS

## 2017-02-18 MED ORDER — ONDANSETRON HCL 4 MG/2ML IJ SOLN
INTRAMUSCULAR | Status: AC
  Filled 2017-02-18: qty 2

## 2017-02-18 MED ORDER — DEXAMETHASONE SODIUM PHOSPHATE 10 MG/ML IJ SOLN
INTRAMUSCULAR | Status: AC
  Filled 2017-02-18: qty 1

## 2017-02-18 MED ORDER — MIDAZOLAM HCL 2 MG/2ML IJ SOLN
INTRAMUSCULAR | Status: AC
  Filled 2017-02-18: qty 2

## 2017-02-18 MED ORDER — SODIUM CHLORIDE 0.9 % IV SOLN
360.0000 mg | INTRAVENOUS | Status: DC
  Administered 2017-02-18 – 2017-03-06 (×17): 360 mg via INTRAVENOUS
  Filled 2017-02-18 (×18): qty 7.2

## 2017-02-18 MED ORDER — LAMOTRIGINE 25 MG PO TABS
125.0000 mg | ORAL_TABLET | Freq: Every day | ORAL | Status: DC
  Administered 2017-02-19 – 2017-03-12 (×23): 125 mg via ORAL
  Filled 2017-02-18 (×23): qty 1

## 2017-02-18 MED ORDER — LIDOCAINE HCL (PF) 2 % IJ SOLN
INTRAMUSCULAR | Status: DC | PRN
  Administered 2017-02-18: 60 mg via INTRADERMAL

## 2017-02-18 MED ORDER — SUCCINYLCHOLINE CHLORIDE 200 MG/10ML IV SOSY
PREFILLED_SYRINGE | INTRAVENOUS | Status: AC
  Filled 2017-02-18: qty 10

## 2017-02-18 MED ORDER — OXYCODONE HCL 5 MG PO TABS: 5.0000 mg | ORAL_TABLET | Freq: Once | ORAL | Status: DC | PRN

## 2017-02-18 MED ORDER — PROPOFOL 500 MG/50ML IV EMUL
INTRAVENOUS | Status: DC | PRN
  Administered 2017-02-18: 200 ug via INTRAVENOUS

## 2017-02-18 MED ORDER — ROCURONIUM BROMIDE 50 MG/5ML IV SOSY
PREFILLED_SYRINGE | INTRAVENOUS | Status: AC
  Filled 2017-02-18: qty 5

## 2017-02-18 MED ORDER — MIDAZOLAM HCL 5 MG/5ML IJ SOLN
INTRAMUSCULAR | Status: DC | PRN
  Administered 2017-02-18: 0.5 mg via INTRAVENOUS

## 2017-02-18 MED ORDER — 0.9 % SODIUM CHLORIDE (POUR BTL) OPTIME
TOPICAL | Status: DC | PRN
Start: 2017-02-18 — End: 2017-02-18
  Administered 2017-02-18: 1000 mL

## 2017-02-18 MED ORDER — FENTANYL CITRATE (PF) 100 MCG/2ML IJ SOLN
INTRAMUSCULAR | Status: AC
  Filled 2017-02-18: qty 2

## 2017-02-18 MED ORDER — FENTANYL CITRATE (PF) 100 MCG/2ML IJ SOLN
INTRAMUSCULAR | Status: DC | PRN
  Administered 2017-02-18: 25 ug via INTRAVENOUS

## 2017-02-18 SURGICAL SUPPLY — 23 items
BLADE CLIPPER SURG (BLADE) IMPLANT
BLADE SURG 15 STRL LF DISP TIS (BLADE) ×1 IMPLANT
BLADE SURG 15 STRL SS (BLADE) ×3
CHLORAPREP W/TINT 26ML (MISCELLANEOUS) IMPLANT
COVER SURGICAL LIGHT HANDLE (MISCELLANEOUS) ×3 IMPLANT
DRAPE LAPAROSCOPIC ABDOMINAL (DRAPES) ×3 IMPLANT
ELECT PENCIL ROCKER SW 15FT (MISCELLANEOUS) ×3 IMPLANT
ELECT REM PT RETURN 15FT ADLT (MISCELLANEOUS) ×3 IMPLANT
GAUZE SPONGE 4X4 12PLY STRL (GAUZE/BANDAGES/DRESSINGS) ×3 IMPLANT
GLOVE BIOGEL PI IND STRL 8 (GLOVE) ×1 IMPLANT
GLOVE BIOGEL PI INDICATOR 8 (GLOVE) ×2
GLOVE SURG SS PI 7.5 STRL IVOR (GLOVE) ×3 IMPLANT
GOWN STRL REUS W/TWL LRG LVL3 (GOWN DISPOSABLE) ×3 IMPLANT
GOWN STRL REUS W/TWL XL LVL3 (GOWN DISPOSABLE) ×3 IMPLANT
KIT BASIN OR (CUSTOM PROCEDURE TRAY) ×3 IMPLANT
NS IRRIG 1000ML POUR BTL (IV SOLUTION) ×3 IMPLANT
PACK GENERAL/GYN (CUSTOM PROCEDURE TRAY) ×3 IMPLANT
PAD ABD 7.5X8 STRL (GAUZE/BANDAGES/DRESSINGS) ×2 IMPLANT
SPONGE LAP 18X18 X RAY DECT (DISPOSABLE) ×3 IMPLANT
SWAB CULTURE ESWAB REG 1ML (MISCELLANEOUS) IMPLANT
SYR BULB IRRIGATION 50ML (SYRINGE) ×3 IMPLANT
TOWEL OR 17X26 10 PK STRL BLUE (TOWEL DISPOSABLE) ×3 IMPLANT
YANKAUER SUCT BULB TIP NO VENT (SUCTIONS) ×3 IMPLANT

## 2017-02-18 NOTE — Progress Notes (Signed)
Pharmacy Antibiotic Note  [name] is a 36 y.o. female admitted on 02/13/2017 with decubitus ulcer and early pelvic osteomyelitis s/p I&D.  Pharmacy has been consulted for ceftazidime and daptomycin dosing.  Plan: Ceftazidime 2g IV q8h Daptomycin 360mg  IV q24h (6mg /kg) Check ck now then weekly while on daptomycin Follow renal function and cultures  Height: 5\' 3"  (160 cm) Weight: 131 lb 9.8 oz (59.7 kg) IBW/kg (Calculated) : 52.4  Temp (24hrs), Avg:97.9 F (36.6 C), Min:97.5 F (36.4 C), Max:98.5 F (36.9 C)  Recent Labs  Lab 02/13/17 1935 02/15/17 0521 02/16/17 0649  WBC 7.7 6.3  --   CREATININE 0.31* 0.44 0.32*  LATICACIDVEN  --  0.8  --     Estimated Creatinine Clearance: 80.4 mL/min (A) (by C-G formula based on SCr of 0.32 mg/dL (L)).    Allergies  Allergen Reactions  . Iodine Anaphylaxis  . Latex Anaphylaxis  . Other Anaphylaxis  . Shellfish-Derived Products Anaphylaxis    Reaction unknown  . Vancomycin Anaphylaxis  . Aspirin Other (See Comments)    Reaction unknown  . Strawberry Extract Hives and Rash    Antimicrobials this admission:  11/19 Ceftazidime >> 11/19 Daptomycin >>  Dose adjustments this admission:    Microbiology results:  11/19 Sacral ulcer (soft tissue): 11/19 Sacral ulcer (fluid): 11/15 MRSA PCR: negative 11/14 Wet prep: moderate WBC  Thank you for allowing pharmacy to be a part of this patient's care.  Peggyann Juba, PharmD, BCPS Pager: [phone] 02/18/2017 4:44 PM

## 2017-02-18 NOTE — Consult Note (Signed)
[facility] Surgery Consult Note  [name] 03-09-1981  [id number].    Requesting MD: Carolin Sicks Chief Complaint/Reason for Consult: Decubitus ulcer  HPI:  [name] is a 36yo female PMH multiple sclerosis/transverse myelitis and paraplegia, who was admitted to [facility] 11/14 with concerns for worsening decubitus ulcer. Patient's mother states that the wound first appeared about 2 years ago; with diligent wound care they thought the ulcer had healed earlier this year, then last Wednesday they noted a new opening. She was also experiencing fever, chills, nausea, vomiting, and diarrhea at that time, so she was brought into the hospital with concerns for worsening infection. Wound care was consulted and they have been doing BID wet to dry dressing changes. General surgery consulted for consideration of operative debridement. Patient is currently afebrile, WBC was WNL on last CBC. She has not been on antibiotics during this admission. States that her vomiting, fever, and chills have resolved.  She has had nothing to eat or drink today.   ROS: Review of Systems  Constitutional: Negative.   HENT: Negative.   Eyes: Negative.   Respiratory: Negative.   Cardiovascular: Negative.   Gastrointestinal: Positive for constipation and nausea.  Genitourinary:       Decubitus ulcer  Musculoskeletal: Negative.   Skin: Negative.   Neurological:       Parapelgia  Psychiatric/Behavioral: Negative.     All systems reviewed and otherwise negative except for as above  Family History  Problem Relation Age of Onset  . Diabetes type II Father   . CAD Father   . Dementia Father   . Stroke Father   . Seizures Father   . Diabetes type II Mother   . Hypertension Mother   . Breast cancer Other   . Diabetes type II Other     Past Medical History:  Diagnosis Date  . Bladder stones    Hx of  . Depression   . GERD (gastroesophageal reflux disease)   . Multiple sclerosis  ([place])   . Paraplegia ([place])   . PTSD (post-traumatic stress disorder)   . Transverse myelitis [facility])     Past Surgical History:  Procedure Laterality Date  . CHOLECYSTECTOMY    . CYSTECTOMY W/ URETEROILEAL CONDUIT    . TONSILLECTOMY    . WISDOM TOOTH EXTRACTION      Social History:  reports that  has never smoked. she has never used smokeless tobacco. She reports that she does not drink alcohol or use drugs.  Allergies:  Allergies  Allergen Reactions  . Iodine Anaphylaxis  . Latex Anaphylaxis  . Other Anaphylaxis  . Shellfish-Derived Products Anaphylaxis    Reaction unknown  . Vancomycin Anaphylaxis  . Aspirin Other (See Comments)    Reaction unknown  . Strawberry Extract Hives and Rash    Medications Prior to Admission  Medication Sig Dispense Refill  . acetaminophen (TYLENOL) 325 MG tablet Take 650 mg by mouth every 6 (six) hours as needed for fever.    . baclofen (LIORESAL) 20 MG tablet Take 20 mg by mouth 3 (three) times daily.    . diazepam (VALIUM) 5 MG tablet Take 1-2 tablets (5-10 mg total) by mouth every 8 (eight) hours. Take 1 tablet in AM, 1 tablet in PM, and 2 tablets at bedtime (Patient taking differently: Take 5-10 mg by mouth 3 (three) times daily. Take 1 tablet in AM, 1 tablet in PM, and 2 tablets at bedtime) 30 tablet 0  . fluticasone (FLONASE) 50 MCG/ACT  nasal spray Place 2 sprays into the nose daily as needed for allergies.     Marland Kitchen gabapentin (NEURONTIN) 400 MG capsule Take 2 capsules (800 mg total) by mouth 3 (three) times daily. 180 capsule 8  . ibuprofen (ADVIL,MOTRIN) 200 MG tablet Take 200 mg every 6 (six) hours as needed by mouth for headache or moderate pain.     Marland Kitchen lamoTRIgine (LAMICTAL) 25 MG tablet Take 125 mg by mouth daily.    . Loperamide HCl (IMODIUM A-D PO) Take 1 tablet by mouth daily as needed (diarrhea).     . loratadine (CLARITIN) 5 MG/5ML syrup Take 5 mg daily as needed by mouth for allergies.     Marland Kitchen lubiprostone (AMITIZA) 24 MCG capsule Take  24 mcg daily with breakfast by mouth.     Marland Kitchen omeprazole (PRILOSEC) 40 MG capsule Take 40 mg by mouth daily.    . sertraline (ZOLOFT) 100 MG tablet Take 100 mg by mouth daily.    . misoprostol (CYTOTEC) 200 MCG tablet Take 3 pills by MOUTH the night before biopsy. (Patient not taking: Reported on 01/28/2017) 3 tablet 0  . saccharomyces boulardii (FLORASTOR) 250 MG capsule Take 1 capsule (250 mg total) by mouth 2 (two) times daily. (Patient not taking: Reported on 02/13/2017) 30 capsule 0    Prior to Admission medications   Medication Sig Start Date End Date Taking? Authorizing Provider  acetaminophen (TYLENOL) 325 MG tablet Take 650 mg by mouth every 6 (six) hours as needed for fever.   Yes [provider]  baclofen (LIORESAL) 20 MG tablet Take 20 mg by mouth 3 (three) times daily.   Yes [provider]  diazepam (VALIUM) 5 MG tablet Take 1-2 tablets (5-10 mg total) by mouth every 8 (eight) hours. Take 1 tablet in AM, 1 tablet in PM, and 2 tablets at bedtime Patient taking differently: Take 5-10 mg by mouth 3 (three) times daily. Take 1 tablet in AM, 1 tablet in PM, and 2 tablets at bedtime 04/02/13  Yes Robbie Lis, MD  fluticasone [facility]) 50 MCG/ACT nasal spray Place 2 sprays into the nose daily as needed for allergies.    Yes [provider]  gabapentin (NEURONTIN) 400 MG capsule Take 2 capsules (800 mg total) by mouth 3 (three) times daily. 08/13/16  Yes Jaffe, Adam R, DO  ibuprofen (ADVIL,MOTRIN) 200 MG tablet Take 200 mg every 6 (six) hours as needed by mouth for headache or moderate pain.    Yes [provider]  lamoTRIgine (LAMICTAL) 25 MG tablet Take 125 mg by mouth daily.   Yes [provider]  Loperamide HCl (IMODIUM A-D PO) Take 1 tablet by mouth daily as needed (diarrhea).    Yes [provider]  loratadine (CLARITIN) 5 MG/5ML syrup Take 5 mg daily as needed by mouth for allergies.    Yes [provider]  lubiprostone  (AMITIZA) 24 MCG capsule Take 24 mcg daily with breakfast by mouth.    Yes [provider]  omeprazole (PRILOSEC) 40 MG capsule Take 40 mg by mouth daily.   Yes [provider]  sertraline (ZOLOFT) 100 MG tablet Take 100 mg by mouth daily.   Yes [provider]  bisacodyl (DULCOLAX) 10 MG suppository Place 1 suppository (10 mg total) daily rectally. 02/15/17   Florencia Reasons, MD  misoprostol (CYTOTEC) 200 MCG tablet Take 3 pills by MOUTH the night before biopsy. Patient not taking: Reported on 01/28/2017 12/31/16   Emily Filbert, MD  saccharomyces  boulardii (FLORASTOR) 250 MG capsule Take 1 capsule (250 mg total) by mouth 2 (two) times daily. Patient not taking: Reported on 02/13/2017 11/17/16   Lavina Hamman, MD  senna-docusate (SENOKOT-S) 8.6-50 MG tablet Take 1 tablet at bedtime by mouth. 02/15/17   Florencia Reasons, MD    Blood pressure 100/63, pulse 82, temperature 98.2 F (36.8 C), temperature source Oral, resp. rate 16, height 5\' 3"  (1.6 m), weight 131 lb 9.8 oz (59.7 kg), SpO2 99 %. Physical Exam: General: pleasant, WD/WN white female who is laying in bed in NAD HEENT: head is normocephalic, atraumatic.  Sclera are noninjected.  Pupils equal and round.  Ears and nose without any masses or lesions.  Mouth is dry. Dentition fair Heart: regular, rate, and rhythm.  No obvious murmurs, gallops, or rubs noted.  Palpable pedal pulses bilaterally Lungs: CTAB, no wheezes, rhonchi, or rales noted.  Respiratory effort nonlabored Abd: well healed midline incision, soft, NT/ND, +BS, no masses, hernias, or organomegaly. Urostomy RLQ MS: muscle wasting noted in all 4 extremities, no sensory or motor function BLE Skin: warm and dry with no masses, lesions, or rashes Psych: A&Ox3 with an appropriate affect. Neuro: cranial nerves grossly intact, normal speech GU:      Results for orders placed or performed during the hospital encounter of 02/13/17 (from the past 48 hour(s))  Iron and  TIBC     Status: Abnormal   Collection Time: 02/16/17  2:35 PM  Result Value Ref Range   Iron 27 (L) 28 - 170 ug/dL   TIBC 437 250 - 450 ug/dL   Saturation Ratios 6 (L) 10.4 - 31.8 %   UIBC 410 ug/dL    Comment: Performed at [facility] [address] [place], [place] [address]  Ferritin     Status: None   Collection Time: 02/16/17  2:35 PM  Result Value Ref Range   Ferritin 20 11 - 307 ng/mL    Comment: Performed at [facility], [place] [address], [place], [place] [address]   No results found.    Assessment/Plan Multiple sclerosis/transverse myelitis and paraplegia Constipation Chronic microcytic anemia/iron deficiency anemia  Decubitus ulcer - MRI 11/16 showed deep soft tissue ulcer extending to the left ischial tuberosity with possible early osteomyelitis - WBC 6.3 (11/16), afebrile today  ID - none VTE - SCDs, lovenox FEN - IVF, NPO for procedure Foley - urostomy tube Follow up - TBD  Plan - I saw and evaluated this patient with Dr. Excell Seltzer. Plan for debridement in OR today, will take intraoperative culture (per ID, INTRA-OPERATIVE DEEP SPECIMENS THEMSELVES NOT SWABS to microbiology lab for aerobic/anerobic cultures).  Wellington Hampshire, [facility] Surgery 02/18/2017, 8:23 AM Pager: [phone] Consults: [phone] Mon-Fri 7:00 am-4:30 pm Sat-Sun 7:00 am-11:30 am

## 2017-02-18 NOTE — Progress Notes (Signed)
PROGRESS NOTE    [name]  PJP:[id number] DOB: Dec 09, 1980 DOA: 02/13/2017 PCP: Lennie Odor, PA-C   Brief Narrative: 36 year old female with history of multiple sclerosis, transverse myelitis, paraplegic, bedbound with chronic sacral decubitus ulcer undergoing outpatient wound care presented to the hospital after noticing that the wound is getting bigger associated with foul-smelling discharge.  Assessment & Plan:   #Chronic sacral decubitus ulcer, unstageable with possible early osteomyelitis: -Patient has foul-smelling discharge on examination.  MRI concerning with nonspecific muscle edema and early osteomyelitis.  Evaluated by wound care nurse.  Patient has no fever, no leukocytosis, ESR normal with a mildly elevated CRP.   -General surgery consult and ID consult appreciated.  Plan for OR for surgical debridement today.  Discussed the surgery team.  Patient was n.p.o. from last midnight.  I discussed with the patient and her mother at bedside and they agreed with the plan.  #History of multiple sclerosis/transverse myelitis and paraplegia: Patient is wheelchair-bound and has urostomy.  Continue supportive care. -Added home dose of Valium for muscle spasm.  #Constipation: Resume home medication for bowel regimen.  #Hypokalemia: Serum potassium level acceptable.  Monitor labs.  #Chronic microcytic anemia/iron deficiency anemia: We will not start iron since there is concern of infection.  Monitor CBC.  DVT prophylaxis: Lovenox subcutaneous Code Status: Full code Family Communication: Discussed with the patient's mother at bedside Disposition Plan: Currently admitted    Consultants:   General surgery  Infectious disease  Procedures: MRI Antimicrobials: None  Subjective: Seen and examined at bedside.  No new event.  No headache, dizziness, chest pain, shortness of breath.  Objective: Vitals:   02/17/17 0623 02/17/17 1302 02/17/17 2050 02/18/17 0644  BP: 110/79  100/66 103/73 100/63  Pulse: 92 92 87 82  Resp: _0 Temp: 98.6 F (37 C) 98.6 F (37 C) 98.2 F (36.8 C) 98.2 F (36.8 C)  TempSrc: Oral Oral Oral Oral  SpO2: 97% 100% 99% 99%  Weight:      Height:        Intake/Output Summary (Last 24 hours) at 02/18/2017 1107 Last data filed at 02/18/2017 0645 Gross per 24 hour  Intake 480 ml  Output 525 ml  Net -45 ml   Filed Weights   02/14/17 0057  Weight: 59.7 kg (131 lb 9.8 oz)    Examination:  General exam: Not in distress, lying in bed comfortable Respiratory system: Clear bilateral, respiratory effort normal, no wheezing Cardiovascular system: Regular rate rhythm, S1-S2 normal.. Gastrointestinal system: Abdomen soft, nontender, nondistended.  Bowel sounds positive Central nervous system: Alert awake and paraplegic. Extremities: Paraplegia Skin: Sacral deep wound with foul-smelling discharge and dressing applied on top of it, examined on 11/17. Psychiatry: Judgement and insight appear normal. Mood & affect appropriate.     Data Reviewed: I have personally reviewed following labs and imaging studies  CBC: Recent Labs  Lab 02/13/17 1935 02/15/17 0521  WBC 7.7 6.3  NEUTROABS  --  4.5  HGB 9.3* 9.2*  HCT 32.3* 32.7*  MCV 71.1* 72.0*  PLT 179 072   Basic Metabolic Panel: Recent Labs  Lab 02/13/17 1935 02/15/17 0521 02/16/17 0649  NA 138 142 140  K 3.7 3.2* 4.0  CL 106 104 103  CO2 _1 GLUCOSE 91 94 95  BUN _2 CREATININE 0.31* 0.44 0.32*  CALCIUM 8.8* 9.1 9.3  MG  --  2.1  --    GFR: Estimated Creatinine Clearance: 80.4 mL/min (A) (by  C-G formula based on SCr of 0.32 mg/dL (L)). Liver Function Tests: Recent Labs  Lab 02/13/17 1935  AST 16  ALT 11*  ALKPHOS 110  BILITOT 0.6  PROT 7.0  ALBUMIN 4.2   No results for input(s): LIPASE, AMYLASE in the last 168 hours. No results for input(s): AMMONIA in the last 168 hours. Coagulation Profile: No results for input(s): INR, PROTIME in  the last 168 hours. Cardiac Enzymes: No results for input(s): CKTOTAL, CKMB, CKMBINDEX, TROPONINI in the last 168 hours. BNP (last 3 results) No results for input(s): PROBNP in the last 8760 hours. HbA1C: No results for input(s): HGBA1C in the last 72 hours. CBG: No results for input(s): GLUCAP in the last 168 hours. Lipid Profile: No results for input(s): CHOL, HDL, LDLCALC, TRIG, CHOLHDL, LDLDIRECT in the last 72 hours. Thyroid Function Tests: No results for input(s): TSH, T4TOTAL, FREET4, T3FREE, THYROIDAB in the last 72 hours. Anemia Panel: Recent Labs    02/16/17 1435  FERRITIN 20  TIBC 437  IRON 27*   Sepsis Labs: Recent Labs  Lab 02/15/17 0521  LATICACIDVEN 0.8    Recent Results (from the past 240 hour(s))  Wet prep, genital     Status: Abnormal   Collection Time: 02/13/17  6:38 PM  Result Value Ref Range Status   Yeast Wet Prep HPF POC NONE SEEN NONE SEEN Final   Trich, Wet Prep NONE SEEN NONE SEEN Final   Clue Cells Wet Prep HPF POC NONE SEEN NONE SEEN Final   WBC, Wet Prep HPF POC MODERATE (A) NONE SEEN Final   Sperm NONE SEEN  Final  MRSA PCR Screening     Status: None   Collection Time: 02/14/17 11:20 AM  Result Value Ref Range Status   MRSA by PCR NEGATIVE NEGATIVE Final    Comment:        The GeneXpert MRSA Assay (FDA approved for NASAL specimens only), is one component of a comprehensive MRSA colonization surveillance program. It is not intended to diagnose MRSA infection nor to guide or monitor treatment for MRSA infections.          Radiology Studies: No results found.      Scheduled Meds: . [MAR Hold] baclofen  20 mg Oral TID  . [MAR Hold] bisacodyl  10 mg Rectal Daily  . chlorhexidine  1 application Topical Once  . [MAR Hold] enoxaparin (LOVENOX) injection  40 mg Subcutaneous Q24H  . [MAR Hold] gabapentin  800 mg Oral TID  . [MAR Hold] lactose free nutrition  237 mL Oral TID WC  . [MAR Hold] lamoTRIgine  125 mg Oral Daily  .  [MAR Hold] lubiprostone  24 mcg Oral Q breakfast  . [MAR Hold] multivitamin  15 mL Oral Daily  . [MAR Hold] pantoprazole  20 mg Oral Daily  . [MAR Hold] senna-docusate  1 tablet Oral BID  . [MAR Hold] sertraline  100 mg Oral Daily   Continuous Infusions: . dextrose 5 % and 0.9 % NaCl with KCl 20 mEq/L    . lactated ringers       LOS: 2 days    Dron Tanna Furry, MD [facility] Pager [phone]  If 7PM-7AM, please contact night-coverage [email or web address] Password [email or web address] 02/18/2017, 11:07 AM

## 2017-02-18 NOTE — Op Note (Signed)
Preoperative Diagnosis: Left ischial decubitus ulcer  Postoprative Diagnosis: Same  Procedure: Procedure(s): Debridement skin, subcutaneous tissue and periosteum left ischial decubitus ulcer   Surgeon: Excell Seltzer T   Assistants: None  Anesthesia:  General endotracheal anesthesia  Indications: Patient is a 36 year old female with long-standing paraplegia and history of left ischial decubitus ulcer which her mother states had previously completely healed.  However she recently developed increasing odor and drainage and discharge from this area with evidence of infection and was admitted.  Examination has revealed a necrotic decubitus ulcer with some purulent drainage as described below.  Debridement has been recommended and accepted.  Infectious disease has requested tissue cultures prior to antibiotics.  I discussed the procedure and indications and risks of general anesthesia, bleeding and poor wound healing with the patient and her mother who agreed to proceed.    Procedure Detail: Patient was brought to the operating room, placed in the supine position on the operating table and general endotracheal anesthesia induced.  She was carefully positioned and padded in right lateral decubitus position and the left buttock and ischium and perineum were widely sterilely prepped and draped.  Patient timeout was performed and correct procedure verified.  Examination of the wound showed a 7 x 2 cm wound running superior to inferior from the left ischium extending down toward the posterior vulva.  The majority of the wound was covered with dense eschar and there were necrotic skin edges.  There was tunneling superiorly through a small sinus tract with some purulent drainage.  Initially small samples of necrotic tissue were obtained from the base of the wound for tissue culture and swabs were obtained of the drainage more superiorly for culture.  Following this using sharp dissection with a #15 blade  skin edges were debrided back to healthy tissue.  Skin and subcutaneous tissue were debrided down to the base of the wound and the necrotic eschar over the base of the wound consisting of likely subcutaneous tissue and periosteum more superiorly where sharply debrided back to viable bleeding tissue.  The sinus tract superiorly was further exposed excising about another centimeter of skin and subcutaneous tissue sharply overlying the sinus tract which unroofed it and I debrided this again down to periosteum superiorly.  Hemostasis was obtained with pressure and cautery.  At this point the entire wound appeared clean with viable tissue at the base and edges.  Moist saline gauze packing and dry sterile dressing was applied.  Sponge needle and instrument counts were correct.    Findings: Above  Estimated Blood Loss:  less than 50 mL         Drains: Wound packed open with moist saline gauze  Blood Given: none          Specimens: #1 wound tissue for aerobic and anaerobic culture #2 tissue drainage for aerobic and anaerobic culture        Complications:  * No complications entered in OR log *         Disposition: PACU - hemodynamically stable.         Condition: stable

## 2017-02-18 NOTE — Anesthesia Preprocedure Evaluation (Signed)
Anesthesia Evaluation  Patient identified by MRN, date of birth, ID band Patient awake    Reviewed: Allergy & Precautions, NPO status , Patient's Chart, lab work & pertinent test results  Airway Mallampati: II  TM Distance: >3 FB Neck ROM: Full    Dental no notable dental hx.    Pulmonary neg pulmonary ROS,    Pulmonary exam normal breath sounds clear to auscultation       Cardiovascular negative cardio ROS Normal cardiovascular exam Rhythm:Regular Rate:Normal     Neuro/Psych Anxiety Depression  Neuromuscular disease negative neurological ROS  negative psych ROS   GI/Hepatic negative GI ROS, Neg liver ROS, GERD  ,  Endo/Other  negative endocrine ROS  Renal/GU negative Renal ROS  negative genitourinary   Musculoskeletal negative musculoskeletal ROS (+)   Abdominal   Peds negative pediatric ROS (+)  Hematology negative hematology ROS (+)   Anesthesia Other Findings Paraplegia  Reproductive/Obstetrics negative OB ROS                             Anesthesia Physical Anesthesia Plan  ASA: III  Anesthesia Plan: General   Post-op Pain Management:    Induction: Intravenous  PONV Risk Score and Plan: 3 and Ondansetron, Dexamethasone and Midazolam  Airway Management Planned: Oral ETT  Additional Equipment:   Intra-op Plan:   Post-operative Plan: Extubation in OR  Informed Consent: I have reviewed the patients History and Physical, chart, labs and discussed the procedure including the risks, benefits and alternatives for the proposed anesthesia with the patient or authorized representative who has indicated his/her understanding and acceptance.   Dental advisory given  Plan Discussed with: CRNA  Anesthesia Plan Comments:         Anesthesia Quick Evaluation

## 2017-02-18 NOTE — Progress Notes (Signed)
Subjective: Recovering from surgery   Antibiotics:  Anti-infectives (From admission, onward)   None      Medications: Scheduled Meds: . baclofen  20 mg Oral TID  . bisacodyl  10 mg Rectal Daily  . enoxaparin (LOVENOX) injection  40 mg Subcutaneous Q24H  . gabapentin  800 mg Oral TID  . lactose free nutrition  237 mL Oral TID WC  . lamoTRIgine  125 mg Oral Daily  . lubiprostone  24 mcg Oral Q breakfast  . multivitamin  15 mL Oral Daily  . pantoprazole  20 mg Oral Daily  . senna-docusate  1 tablet Oral BID  . sertraline  100 mg Oral Daily   Continuous Infusions: . dextrose 5 % and 0.9 % NaCl with KCl 20 mEq/L     PRN Meds:.diazepam, ibuprofen, ondansetron (ZOFRAN) IV    Objective: Weight change:   Intake/Output Summary (Last 24 hours) at 02/18/2017 1620 Last data filed at 02/18/2017 1315 Gross per 24 hour  Intake 440 ml  Output 625 ml  Net -185 ml   Blood pressure (!) 106/59, pulse 92, temperature 98.5 F (36.9 C), temperature source Oral, resp. rate 18, height 5\' 3"  (1.6 m), weight 131 lb 9.8 oz (59.7 kg), SpO2 99 %. Temp:  [97.5 F (36.4 C)-98.5 F (36.9 C)] 98.5 F (36.9 C) (11/19 1547) Pulse Rate:  [82-108] 92 (11/19 1547) Resp:  [16-21] 18 (11/19 1547) BP: (100-135)/(57-88) 106/59 (11/19 1547) SpO2:  [99 %-100 %] 99 % (11/19 1547)  Physical Exam: General: Alert and awake, oriented x3, not in any acute distress. Talking to pastor  Neuro: no new focal deficits  CBC:  CBC Latest Ref Rng & Units 02/15/2017 02/13/2017 11/17/2016  WBC 4.0 - 10.5 K/uL 6.3 7.7 4.3  Hemoglobin 12.0 - 15.0 g/dL 9.2(L) 9.3(L) 8.8(L)  Hematocrit 36.0 - 46.0 % 32.7(L) 32.3(L) 28.8(L)  Platelets 150 - 400 K/uL 191 179 227     BMET Recent Labs    02/16/17 0649  NA 140  K 4.0  CL 103  CO2 27  GLUCOSE 95  BUN 10  CREATININE 0.32*  CALCIUM 9.3     Liver Panel  No results for input(s): PROT, ALBUMIN, AST, ALT, ALKPHOS, BILITOT, BILIDIR, IBILI in the  last 72 hours.     Sedimentation Rate No results for input(s): ESRSEDRATE in the last 72 hours. C-Reactive Protein No results for input(s): CRP in the last 72 hours.  Micro Results: Recent Results (from the past 720 hour(s))  Wet prep, genital     Status: Abnormal   Collection Time: 02/13/17  6:38 PM  Result Value Ref Range Status   Yeast Wet Prep HPF POC NONE SEEN NONE SEEN Final   Trich, Wet Prep NONE SEEN NONE SEEN Final   Clue Cells Wet Prep HPF POC NONE SEEN NONE SEEN Final   WBC, Wet Prep HPF POC MODERATE (A) NONE SEEN Final   Sperm NONE SEEN  Final  MRSA PCR Screening     Status: None   Collection Time: 02/14/17 11:20 AM  Result Value Ref Range Status   MRSA by PCR NEGATIVE NEGATIVE Final    Comment:        The GeneXpert MRSA Assay (FDA approved for NASAL specimens only), is one component of a comprehensive MRSA colonization surveillance program. It is not intended to diagnose MRSA infection nor to guide or monitor treatment for MRSA infections.   Aerobic/Anaerobic Culture (surgical/deep wound)     Status:  None (Preliminary result)   Collection Time: 02/18/17 12:02 PM  Result Value Ref Range Status   Specimen Description TISSUE SOFT LT SACRAL ULCER  Final   Special Requests NONE  Final   Gram Stain   Final    FEW WBC PRESENT,BOTH PMN AND MONONUCLEAR ABUNDANT GRAM POSITIVE COCCI ABUNDANT GRAM NEGATIVE RODS ABUNDANT GRAM POSITIVE RODS Performed at [facility], [place] [address] [place], [place] [address]    Culture PENDING  Incomplete   Report Status PENDING  Incomplete  Aerobic/Anaerobic Culture (surgical/deep wound)     Status: None (Preliminary result)   Collection Time: 02/18/17 12:14 PM  Result Value Ref Range Status   Specimen Description FLUID SACRAL ULCER  Final   Special Requests NONE  Final   Gram Stain   Final    RARE WBC PRESENT,BOTH PMN AND MONONUCLEAR MODERATE GRAM POSITIVE COCCI MODERATE GRAM POSITIVE RODS Performed at [facility], [place] [address], [place], [place] [address]    Culture PENDING  Incomplete   Report Status PENDING  Incomplete    Studies/Results: No results found.    Assessment/Plan:  INTERVAL HISTORY: sp debridement by Dr. 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 Excell Seltzer of skin, subcutaneous tissue and periosteum left ischial decubitus ulcer--> cultures. There was also fair amout of tunneling with purulence that was swabbed for culture.  Will start daptomycin and ceftazidime  followup cultures       LOS: 2 days   Alcide Evener 02/18/2017, 4:20 PM

## 2017-02-18 NOTE — Transfer of Care (Signed)
Immediate Anesthesia Transfer of Care Note  Patient: [name]  Procedure(s) Performed: IRRIGATION AND DEBRIDEMENT ABSCESS (N/A )  Patient Location: PACU  Anesthesia Type:General  Level of Consciousness: awake, alert  and oriented  Airway & Oxygen Therapy: Patient Spontanous Breathing and Patient connected to face mask oxygen  Post-op Assessment: Report given to RN and Post -op Vital signs reviewed and stable  Post vital signs: Reviewed and stable  Last Vitals:  Vitals:   02/17/17 2050 02/18/17 0644  BP: 103/73 100/63  Pulse: 87 82  Resp: 18 16  Temp: 36.8 C 36.8 C  SpO2: 99% 99%    Last Pain:  Vitals:   02/18/17 0644  TempSrc: Oral  PainSc:       Patients Stated Pain Goal: 2 (27/03/50 0938)  Complications: No apparent anesthesia complications

## 2017-02-18 NOTE — Anesthesia Postprocedure Evaluation (Signed)
Anesthesia Post Note  Patient: [name]  Procedure(s) Performed: IRRIGATION AND DEBRIDEMENT ABSCESS (N/A )     Anesthesia Post Evaluation  Last Vitals:  Vitals:   02/18/17 1330 02/18/17 1350  BP: 116/66 113/69  Pulse: 94 91  Resp: (!) 21 18  Temp: (!) 36.4 C (!) 36.4 C  SpO2: 100% 100%    Last Pain:  Vitals:   02/18/17 0644  TempSrc: Oral  PainSc:                  Lynda Rainwater

## 2017-02-18 NOTE — Consult Note (Signed)
[place] Nurse wound consult note Reason for Consult: Worsening of left ischial tuberosity pressure injury Wound type: Pressure injury, Unstageable. CT indicates soft tissue ulcer with possible early osteomyelitis. CCS consulted and Dr. Excell Seltzer plans to take to OR today for debridement. Pressure Injury POA: Yes Patient is on a mattress replacement with low air loss feature both here and at home.  She has the services of [facility] services. She and her mother have been instructed on decreasing HOB elevation while in bed and the importance of shifting her weight and boosting to redistribute pressure while in chair.  [place] nursing team will not follow, but will remain available to this patient, the nursing and medical teams. Please reconsult as needed. Thanks, Maudie Flakes, MSN, RN, [place], Arther Abbott  Pager# [phone]

## 2017-02-18 NOTE — Anesthesia Procedure Notes (Signed)
Procedure Name: Intubation Date/Time: 02/18/2017 11:51 AM Performed by: [place] Foy Vanduyne C, CRNA Pre-anesthesia Checklist: Patient identified, Emergency Drugs available, Suction available and Patient being monitored Patient Re-evaluated:Patient Re-evaluated prior to induction Oxygen Delivery Method: Circle system utilized Preoxygenation: Pre-oxygenation with 100% oxygen Induction Type: IV induction Ventilation: Mask ventilation without difficulty Laryngoscope Size: Mac and 3 Grade View: Grade III Tube type: Oral Tube size: 7.0 mm Number of attempts: 1 Airway Equipment and Method: Bougie stylet Placement Confirmation: positive ETCO2 and breath sounds checked- equal and bilateral Secured at: 22 cm Tube secured with: Tape Dental Injury: Teeth and Oropharynx as per pre-operative assessment

## 2017-02-19 LAB — CBC
HCT: 29.2 % — ABNORMAL LOW (ref 36.0–46.0)
Hemoglobin: 8.3 g/dL — ABNORMAL LOW (ref 12.0–15.0)
MCH: 20.9 pg — AB (ref 26.0–34.0)
MCHC: 28.4 g/dL — AB (ref 30.0–36.0)
MCV: 73.4 fL — ABNORMAL LOW (ref 78.0–100.0)
PLATELETS: 208 10*3/uL (ref 150–400)
RBC: 3.98 MIL/uL (ref 3.87–5.11)
RDW: 20.9 % — ABNORMAL HIGH (ref 11.5–15.5)
WBC: 5.7 10*3/uL (ref 4.0–10.5)

## 2017-02-19 LAB — BASIC METABOLIC PANEL
Anion gap: 9 (ref 5–15)
BUN: 10 mg/dL (ref 6–20)
CO2: 28 mmol/L (ref 22–32)
Calcium: 9 mg/dL (ref 8.9–10.3)
Chloride: 104 mmol/L (ref 101–111)
Glucose, Bld: 92 mg/dL (ref 65–99)
POTASSIUM: 4.1 mmol/L (ref 3.5–5.1)
SODIUM: 141 mmol/L (ref 135–145)

## 2017-02-19 MED ORDER — METRONIDAZOLE 500 MG PO TABS
500.0000 mg | ORAL_TABLET | Freq: Three times a day (TID) | ORAL | Status: DC
  Administered 2017-02-19 – 2017-02-20 (×2): 500 mg via ORAL
  Filled 2017-02-19 (×3): qty 1

## 2017-02-19 MED ORDER — POTASSIUM CHLORIDE IN NACL 20-0.9 MEQ/L-% IV SOLN
INTRAVENOUS | Status: DC
  Administered 2017-02-19 – 2017-03-10 (×9): via INTRAVENOUS
  Filled 2017-02-19 (×11): qty 1000

## 2017-02-19 NOTE — Progress Notes (Signed)
Patient ID: [name], female   DOB: 04-22-1980, 36 y.o.   MRN: [id number]  [facility] Surgery Progress Note  1 Day Post-Op  Subjective: CC-  No complaints this morning. Enjoyed breakfast. Ready for first dressing changes. WBC WNL and patient afebrile.  Objective: Vital signs in last 24 hours: Temp:  [97.5 F (36.4 C)-98.6 F (37 C)] 98 F (36.7 C) (11/20 0507) Pulse Rate:  [89-108] 90 (11/20 0507) Resp:  [18-21] 18 (11/20 0507) BP: (94-135)/(49-88) 94/49 (11/20 0507) SpO2:  [99 %-100 %] 100 % (11/20 0507) Last BM Date: 02/17/17  Intake/Output from previous day: 11/19 0701 - 11/20 0700 In: 1412.2 [P.O.:180; I.V.:1025; IV Piggyback:207.2] Out: 1000 [Urine:1000] Intake/Output this shift: No intake/output data recorded.  PE: Gen:  Alert, NAD, pleasant HEENT: EOM's intact, pupils equal and round Pulm:  effort normal Abd: Soft, NT/ND, +BS Psych: A&Ox3  GU: left ischial wound s/p I&D with beefy red tissue, no purulent drainage, proximal aspect has about 3cm tunneling  Lab Results:  Recent Labs    02/19/17 0512  WBC 5.7  HGB 8.3*  HCT 29.2*  PLT 208   BMET Recent Labs    02/19/17 0512  NA 141  K 4.1  CL 104  CO2 28  GLUCOSE 92  BUN 10  CREATININE <0.30*  CALCIUM 9.0   PT/INR No results for input(s): LABPROT, INR in the last 72 hours. CMP     Component Value Date/Time   NA 141 02/19/2017 0512   K 4.1 02/19/2017 0512   CL 104 02/19/2017 0512   CO2 28 02/19/2017 0512   GLUCOSE 92 02/19/2017 0512   BUN 10 02/19/2017 0512   CREATININE <0.30 (L) 02/19/2017 0512   CREATININE 0.41 (L) 03/22/2016 1205   CALCIUM 9.0 02/19/2017 0512   PROT 7.0 02/13/2017 1935   ALBUMIN 4.2 02/13/2017 1935   AST 16 02/13/2017 1935   ALT 11 (L) 02/13/2017 1935   ALKPHOS 110 02/13/2017 1935   BILITOT 0.6 02/13/2017 1935   GFRNONAA NOT CALCULATED 02/19/2017 0512   GFRAA NOT CALCULATED 02/19/2017 0512   Lipase     Component Value Date/Time   LIPASE 22  10/17/2016 1043       Studies/Results: No results found.  Anti-infectives: Anti-infectives (From admission, onward)   Start     Dose/Rate Route Frequency Ordered Stop   02/18/17 1800  cefTAZidime (FORTAZ) 2 g in dextrose 5 % 50 mL IVPB     2 g 100 mL/hr over 30 Minutes Intravenous Every 8 hours 02/18/17 1631     02/18/17 1800  DAPTOmycin (CUBICIN) 360 mg in sodium chloride 0.9 % IVPB     360 mg 214.4 mL/hr over 30 Minutes Intravenous Every 24 hours 02/18/17 1633         Assessment/Plan Multiple sclerosis/transverse myelitis and paraplegia Constipation Chronic microcytic anemia/iron deficiency anemia  Left ischial decubitus ulcer S/p Debridement skin, subcutaneous tissue and periosteum left ischial decubitus ulcer 11/19 Dr. Excell Seltzer - POD 1 - cultures pending  ID - fortaz 11/19>>, daptomycin 11/19>> FEN - regular diet VTE - SCDs, lovenox Foley - none Follow up - Dr. Excell Seltzer vs DOW  Plan - BID wet to dry dressing changes. Continue antibiotics per ID and follow culture. Will order [facility] RN for assistance with wound care.   LOS: 3 days    [name] , [facility] Surgery 02/19/2017, 9:22 AM Pager: [phone] Consults: [phone] Mon-Fri 7:00 am-4:30 pm Sat-Sun 7:00 am-11:30 am

## 2017-02-19 NOTE — Progress Notes (Signed)
Nutrition Follow-up  INTERVENTION:   -Continue Boost Plus chocolate- Each supplement provides 360kcal and 14g protein.   -Continue liquid MVI -RD will continue to monitor for plan  NUTRITION DIAGNOSIS:   Increased nutrient needs related to wound healing as evidenced by estimated needs.  Ongoing.  GOAL:   Patient will meet greater than or equal to 90% of their needs  Progressing.  MONITOR:   PO intake, Supplement acceptance, Labs, Weight trends, Skin, I & O's  ASSESSMENT:   36 y.o. female with medical history significant of MS, tranverse myelitis  bedbound state with decub wound that has been healing well for the past year comes in after her mom noticing the wound has gotten bigger in the last day or so.  She only has wound care nurse come out to her home once a month.  No fevers.  No n/v/d.  Pt referred for admission for possible need of wound debriedement.  Pt is s/p wound debridement 11/19. Pt didn't eat very much d/t procedure. Last night she consumed 75% of dinner which provided ~480 kcal and 8g protein.   On 11/18 intakes were as follows: B: 90% ~560 kcal, 20g protein D: 65% ~410 kcal, 13g protein  Pt drinking Boost Plus supplements.   Labs reviewed. Medications: Protonix tablet daily, Senokot-S tablet BID  Diet Order:  Diet - low sodium heart healthy Diet regular Room service appropriate? Yes; Fluid consistency: Thin  EDUCATION NEEDS:   Education needs have been addressed  Skin:  Skin Assessment: Skin Integrity Issues: Skin Integrity Issues:: Stage II, Other (Comment) Stage II: sacrum Other: buttocks wound  Last BM:  11/15  Height:   Ht Readings from Last 1 Encounters:  02/14/17 5\' 3"  (1.6 m)    Weight:   Wt Readings from Last 1 Encounters:  02/14/17 131 lb 9.8 oz (59.7 kg)    Ideal Body Weight:  49.7 kg  BMI:  Body mass index is 23.31 kg/m.  Estimated Nutritional Needs:   Kcal:  1500-1700  Protein:  70-80g  Fluid:  1.7L/day  Clayton Bibles, MS, RD, LDN [place] [facility] Pager: [phone] After Hours Pager: [phone]

## 2017-02-19 NOTE — Progress Notes (Signed)
PROGRESS NOTE    [name]  EXM:[id number] DOB: 06-04-1980 DOA: 02/13/2017 PCP: Lennie Odor, PA-C   Brief Narrative: 36 year old female with history of multiple sclerosis, transverse myelitis, paraplegic, bedbound with chronic sacral decubitus ulcer undergoing outpatient wound care presented to the hospital after noticing that the wound is getting bigger associated with foul-smelling discharge.  Assessment & Plan:   #Chronic sacral decubitus ulcer, unstageable with possible early osteomyelitis: -Patient has foul-smelling discharge on examination.  MRI concerning with nonspecific muscle edema and early osteomyelitis.  Evaluated by wound care nurse.  Patient has no fever, no leukocytosis, ESR normal with a mildly elevated CRP.   -General surgery consult and ID consult appreciated.  Status post surgical debridement of sacral decubiti on 11/19, the cultures were sent and patient was a started on broad-spectrum antibiotics by infectious disease.  Patient is currently on ceftazidime and daptomycin.  She likely needs long-term IV antibiotics depending on culture results.  I have discussed above with the patient and her mother at bedside.  Continue supportive care.  I discussed with the surgery team as well.  #History of multiple sclerosis/transverse myelitis and paraplegia: Patient is wheelchair-bound and has urostomy.  Continue supportive care. -Added home dose of Valium for muscle spasm.  #Constipation: Resume home medication for bowel regimen.  #Hypokalemia: Serum potassium level acceptable.  Monitor labs.  #Chronic microcytic anemia/iron deficiency anemia: We will not start iron because of infection.  She may benefit from iron once infection resolves.  Acute drop in hemoglobin today likely acute blood loss during OR.  Repeat CBC in the morning.  #Hypotension today, asymptomatic unknown if it is contributed by dehydration or pain medication.  Continue IV fluid and monitor blood  pressure.  She is not on antihypertensive medications.  DVT prophylaxis: Lovenox subcutaneous Code Status: Full code Family Communication: Discussed with the patient's mother at bedside Disposition Plan: Currently admitted    Consultants:   General surgery  Infectious disease  Procedures: MRI Antimicrobials: None  Subjective: Seen and examined at bedside.  Had surgery yesterday.  Denied nausea, vomiting, headache, dizziness, chest pain, shortness of breath. Objective: Vitals:   02/18/17 2132 02/19/17 0210 02/19/17 0507 02/19/17 1026  BP: 103/61 106/60 (!) 94/49 (!) 97/54  Pulse: 89 95 90 95  Resp: 18 18 18 18   Temp: 98.5 F (36.9 C) 98.5 F (36.9 C) 98 F (36.7 C) 97.6 F (36.4 C)  TempSrc: Oral Oral Oral Oral  SpO2: 100% 100% 100% 100%  Weight:      Height:        Intake/Output Summary (Last 24 hours) at 02/19/2017 1101 Last data filed at 02/19/2017 0600 Gross per 24 hour  Intake 1412.2 ml  Output 850 ml  Net 562.2 ml   Filed Weights   02/14/17 0057  Weight: 59.7 kg (131 lb 9.8 oz)    Examination:  General exam: Lying on bed, not in distress Respiratory system: Clear bilateral, respiratory effort normal.  No wheezing Cardiovascular system: Regular rate rhythm, S1-S2 normal. Gastrointestinal system: Abdomen soft, nontender, nondistended.  Bowel sounds positive Central nervous system: Alert awake and paraplegic. Extremities: Paraplegia Skin: Sacral deep wound with foul-smelling discharge and dressing applied on top of it, examined on 11/17. Psychiatry: Judgement and insight appear normal. Mood & affect appropriate.     Data Reviewed: I have personally reviewed following labs and imaging studies  CBC: Recent Labs  Lab 02/13/17 1935 02/15/17 0521 02/19/17 0512  WBC 7.7 6.3 5.7  NEUTROABS  --  4.5  --  HGB 9.3* 9.2* 8.3*  HCT 32.3* 32.7* 29.2*  MCV 71.1* 72.0* 73.4*  PLT 179 191 353   Basic Metabolic Panel: Recent Labs  Lab 02/13/17 1935  02/15/17 0521 02/16/17 0649 02/19/17 0512  NA 138 142 140 141  K 3.7 3.2* 4.0 4.1  CL 106 104 103 104  CO2 25 29 27 28   GLUCOSE 91 94 95 92  BUN 11 9 10 10   CREATININE 0.31* 0.44 0.32* <0.30*  CALCIUM 8.8* 9.1 9.3 9.0  MG  --  2.1  --   --    GFR: CrCl cannot be calculated (This lab value cannot be used to calculate CrCl because it is not a number: <0.30). Liver Function Tests: Recent Labs  Lab 02/13/17 1935  AST 16  ALT 11*  ALKPHOS 110  BILITOT 0.6  PROT 7.0  ALBUMIN 4.2   No results for input(s): LIPASE, AMYLASE in the last 168 hours. No results for input(s): AMMONIA in the last 168 hours. Coagulation Profile: No results for input(s): INR, PROTIME in the last 168 hours. Cardiac Enzymes: Recent Labs  Lab 02/18/17 1630  CKTOTAL 21*   BNP (last 3 results) No results for input(s): PROBNP in the last 8760 hours. HbA1C: No results for input(s): HGBA1C in the last 72 hours. CBG: No results for input(s): GLUCAP in the last 168 hours. Lipid Profile: No results for input(s): CHOL, HDL, LDLCALC, TRIG, CHOLHDL, LDLDIRECT in the last 72 hours. Thyroid Function Tests: No results for input(s): TSH, T4TOTAL, FREET4, T3FREE, THYROIDAB in the last 72 hours. Anemia Panel: Recent Labs    02/16/17 1435  FERRITIN 20  TIBC 437  IRON 27*   Sepsis Labs: Recent Labs  Lab 02/15/17 0521  LATICACIDVEN 0.8    Recent Results (from the past 240 hour(s))  Wet prep, genital     Status: Abnormal   Collection Time: 02/13/17  6:38 PM  Result Value Ref Range Status   Yeast Wet Prep HPF POC NONE SEEN NONE SEEN Final   Trich, Wet Prep NONE SEEN NONE SEEN Final   Clue Cells Wet Prep HPF POC NONE SEEN NONE SEEN Final   WBC, Wet Prep HPF POC MODERATE (A) NONE SEEN Final   Sperm NONE SEEN  Final  MRSA PCR Screening     Status: None   Collection Time: 02/14/17 11:20 AM  Result Value Ref Range Status   MRSA by PCR NEGATIVE NEGATIVE Final    Comment:        The GeneXpert MRSA Assay  (FDA approved for NASAL specimens only), is one component of a comprehensive MRSA colonization surveillance program. It is not intended to diagnose MRSA infection nor to guide or monitor treatment for MRSA infections.   Aerobic/Anaerobic Culture (surgical/deep wound)     Status: None (Preliminary result)   Collection Time: 02/18/17 12:02 PM  Result Value Ref Range Status   Specimen Description TISSUE SOFT LT SACRAL ULCER  Final   Special Requests NONE  Final   Gram Stain   Final    FEW WBC PRESENT,BOTH PMN AND MONONUCLEAR ABUNDANT GRAM POSITIVE COCCI ABUNDANT GRAM NEGATIVE RODS ABUNDANT GRAM POSITIVE RODS Performed at [facility], [place] [address] [place], [place] [address]    Culture PENDING  Incomplete   Report Status PENDING  Incomplete  Aerobic/Anaerobic Culture (surgical/deep wound)     Status: None (Preliminary result)   Collection Time: 02/18/17 12:14 PM  Result Value Ref Range Status   Specimen Description FLUID SACRAL ULCER  Final  Special Requests NONE  Final   Gram Stain   Final    RARE WBC PRESENT,BOTH PMN AND MONONUCLEAR MODERATE GRAM POSITIVE COCCI MODERATE GRAM POSITIVE RODS Performed at [facility], [place] [address], [place], [place] [address]    Culture PENDING  Incomplete   Report Status PENDING  Incomplete         Radiology Studies: No results found.      Scheduled Meds: . baclofen  20 mg Oral TID  . bisacodyl  10 mg Rectal Daily  . enoxaparin (LOVENOX) injection  40 mg Subcutaneous Q24H  . gabapentin  800 mg Oral TID  . lactose free nutrition  237 mL Oral TID WC  . lamoTRIgine  125 mg Oral Daily  . lubiprostone  24 mcg Oral Q breakfast  . multivitamin  15 mL Oral Daily  . pantoprazole  20 mg Oral Daily  . senna-docusate  1 tablet Oral BID  . sertraline  100 mg Oral Daily   Continuous Infusions: . cefTAZidime (FORTAZ)  IV Stopped (02/19/17 0211)  . DAPTOmycin (CUBICIN)  IV Stopped (02/18/17 1900)     LOS: 3 days     Clayborne Divis Tanna Furry, MD [facility] Pager [phone]  If 7PM-7AM, please contact night-coverage [email or web address] Password [email or web address] 02/19/2017, 11:01 AM

## 2017-02-19 NOTE — Progress Notes (Signed)
[facility] Infusion Coordinator will follow pt with ID team to support home IV ABX at DC as ordered.  If patient discharges after hours, please call [phone].   [name] 02/19/2017, 8:56 AM

## 2017-02-19 NOTE — Discharge Instructions (Signed)
WOUND CARE: - dressing to be changed twice daily - supplies: sterile saline, kerlix, scissors, ABD pads, tape  - remove dressing and all packing carefully, moistening with sterile saline as needed to avoid packing/internal dressing sticking to the wound. - clean edges of skin around the wound with water/gauze, making sure there is no tape debris or leakage left on skin that could cause skin irritation or breakdown. - dampen and clean kerlix with sterile saline and pack wound from wound base to skin level, making sure to take note of any possible areas of wound tracking, tunneling and packing appropriately. Wound can be packed loosely. Trim kerlix to size if a whole kerlix is not required. - cover wound with a dry ABD pad and secure with tape.  - write the date/time on the dry dressing/tape to better track when the last dressing change occurred. - change dressing as needed if leakage occurs, wound gets contaminated, or patient requests to shower. - patient may shower daily with wound open and following the shower the wound should be dried and a clean dressing placed.

## 2017-02-19 NOTE — Progress Notes (Signed)
Subjective:  No new complaints   Antibiotics:  Anti-infectives (From admission, onward)   Start     Dose/Rate Route Frequency Ordered Stop   02/18/17 1800  cefTAZidime (FORTAZ) 2 g in dextrose 5 % 50 mL IVPB     2 g 100 mL/hr over 30 Minutes Intravenous Every 8 hours 02/18/17 1631     02/18/17 1800  DAPTOmycin (CUBICIN) 360 mg in sodium chloride 0.9 % IVPB     360 mg 214.4 mL/hr over 30 Minutes Intravenous Every 24 hours 02/18/17 1633        Medications: Scheduled Meds: . baclofen  20 mg Oral TID  . bisacodyl  10 mg Rectal Daily  . enoxaparin (LOVENOX) injection  40 mg Subcutaneous Q24H  . gabapentin  800 mg Oral TID  . lactose free nutrition  237 mL Oral TID WC  . lamoTRIgine  125 mg Oral Daily  . lubiprostone  24 mcg Oral Q breakfast  . multivitamin  15 mL Oral Daily  . pantoprazole  20 mg Oral Daily  . senna-docusate  1 tablet Oral BID  . sertraline  100 mg Oral Daily   Continuous Infusions: . 0.9 % NaCl with KCl 20 mEq / L 100 mL/hr at 02/19/17 1413  . cefTAZidime (FORTAZ)  IV Stopped (02/19/17 1840)  . DAPTOmycin (CUBICIN)  IV Stopped (02/19/17 1734)   PRN Meds:.diazepam, ibuprofen, ondansetron (ZOFRAN) IV    Objective: Weight change:   Intake/Output Summary (Last 24 hours) at 02/19/2017 2040 Last data filed at 02/19/2017 1848 Gross per 24 hour  Intake 1625 ml  Output 1350 ml  Net 275 ml   Blood pressure 105/68, pulse 99, temperature 98.7 F (37.1 C), temperature source Oral, resp. rate 17, height 5\' 3"  (1.6 m), weight 131 lb 9.8 oz (59.7 kg), SpO2 100 %. Temp:  [97.6 F (36.4 C)-98.7 F (37.1 C)] 98.7 F (37.1 C) (11/20 1340) Pulse Rate:  [89-99] 99 (11/20 1340) Resp:  [17-18] 17 (11/20 1340) BP: (94-106)/(49-68) 105/68 (11/20 1340) SpO2:  [100 %] 100 % (11/20 1340)  Physical Exam: General: Alert and awake, oriented x3, not in any acute distress. CV: rr Pulm; no wheezes, resp distress Neuro: no new focal deficits  CBC:  CBC  Latest Ref Rng & Units 02/19/2017 02/15/2017 02/13/2017  WBC 4.0 - 10.5 K/uL 5.7 6.3 7.7  Hemoglobin 12.0 - 15.0 g/dL 8.3(L) 9.2(L) 9.3(L)  Hematocrit 36.0 - 46.0 % 29.2(L) 32.7(L) 32.3(L)  Platelets 150 - 400 K/uL 208 191 179     BMET Recent Labs    02/19/17 0512  NA 141  K 4.1  CL 104  CO2 28  GLUCOSE 92  BUN 10  CREATININE <0.30*  CALCIUM 9.0     Liver Panel  No results for input(s): PROT, ALBUMIN, AST, ALT, ALKPHOS, BILITOT, BILIDIR, IBILI in the last 72 hours.     Sedimentation Rate No results for input(s): ESRSEDRATE in the last 72 hours. C-Reactive Protein No results for input(s): CRP in the last 72 hours.  Micro Results: Recent Results (from the past 720 hour(s))  Wet prep, genital     Status: Abnormal   Collection Time: 02/13/17  6:38 PM  Result Value Ref Range Status   Yeast Wet Prep HPF POC NONE SEEN NONE SEEN Final   Trich, Wet Prep NONE SEEN NONE SEEN Final   Clue Cells Wet Prep HPF POC NONE SEEN NONE SEEN Final   WBC, Wet Prep HPF POC MODERATE (A) NONE  SEEN Final   Sperm NONE SEEN  Final  MRSA PCR Screening     Status: None   Collection Time: 02/14/17 11:20 AM  Result Value Ref Range Status   MRSA by PCR NEGATIVE NEGATIVE Final    Comment:        The GeneXpert MRSA Assay (FDA approved for NASAL specimens only), is one component of a comprehensive MRSA colonization surveillance program. It is not intended to diagnose MRSA infection nor to guide or monitor treatment for MRSA infections.   Aerobic/Anaerobic Culture (surgical/deep wound)     Status: None (Preliminary result)   Collection Time: 02/18/17 12:02 PM  Result Value Ref Range Status   Specimen Description TISSUE SOFT LT SACRAL ULCER  Final   Special Requests TISSUE SENT INSIDE SWABS  Final   Gram Stain   Final    FEW WBC PRESENT,BOTH PMN AND MONONUCLEAR ABUNDANT GRAM POSITIVE COCCI ABUNDANT GRAM NEGATIVE RODS ABUNDANT GRAM POSITIVE RODS    Culture   Final    CULTURE  REINCUBATED FOR BETTER GROWTH Performed at [facility], [place] [address], [place], [place] [address]    Report Status PENDING  Incomplete  Aerobic/Anaerobic Culture (surgical/deep wound)     Status: None (Preliminary result)   Collection Time: 02/18/17 12:14 PM  Result Value Ref Range Status   Specimen Description FLUID SACRAL ULCER  Final   Special Requests NONE  Final   Gram Stain   Final    RARE WBC PRESENT,BOTH PMN AND MONONUCLEAR MODERATE GRAM POSITIVE COCCI MODERATE GRAM POSITIVE RODS    Culture   Final    CULTURE REINCUBATED FOR BETTER GROWTH Performed at [facility], [place] [address] [place], [place] [address]    Report Status PENDING  Incomplete    Studies/Results: No results found.    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 Excell Seltzer of skin, subcutaneous tissue and periosteum left ischial decubitus ulcer--> cultures. There was also fair amout of tunneling with purulence that was swabbed for culture.  GS: GPC, GPR, GNR seen on the 2 specimens. One would HOPE that OFF antimicrobials we would isolate an organism  IF we dont isolate anything would consider a  6 week course of cubicin, ceftazidime and oral flagyl  I will put in OPAT consult   I have alerted [name] from [facility]  I will check in on the patient tomorrow.  Dr. Johnnye Sima will be available for questions November 22nd through 25th.          LOS: 3 days   Alcide Evener 02/19/2017, 8:40 PM

## 2017-02-20 ENCOUNTER — Telehealth: Payer: Self-pay | Admitting: General Practice

## 2017-02-20 DIAGNOSIS — A498 Other bacterial infections of unspecified site: Secondary | ICD-10-CM

## 2017-02-20 LAB — CBC
HEMATOCRIT: 26.8 % — AB (ref 36.0–46.0)
HEMOGLOBIN: 7.7 g/dL — AB (ref 12.0–15.0)
MCH: 21.3 pg — AB (ref 26.0–34.0)
MCHC: 28.7 g/dL — AB (ref 30.0–36.0)
MCV: 74 fL — AB (ref 78.0–100.0)
Platelets: 196 10*3/uL (ref 150–400)
RBC: 3.62 MIL/uL — AB (ref 3.87–5.11)
RDW: 21.1 % — ABNORMAL HIGH (ref 11.5–15.5)
WBC: 4.3 10*3/uL (ref 4.0–10.5)

## 2017-02-20 MED ORDER — SODIUM CHLORIDE 0.9 % IV SOLN
1.0000 g | Freq: Three times a day (TID) | INTRAVENOUS | Status: DC
  Administered 2017-02-20 – 2017-02-25 (×13): 1 g via INTRAVENOUS
  Filled 2017-02-20 (×16): qty 1

## 2017-02-20 MED ORDER — FLUTICASONE PROPIONATE 50 MCG/ACT NA SUSP
2.0000 | Freq: Every day | NASAL | Status: DC | PRN
  Administered 2017-02-20 – 2017-02-21 (×2): 2 via NASAL
  Filled 2017-02-20: qty 16

## 2017-02-20 NOTE — Progress Notes (Signed)
Pharmacy Antibiotic Note  [name] is a 36 y.o. female admitted on 02/13/2017 with decubitus ulcer and early pelvic osteomyelitis s/p I&D.  Tissue from sacral ulcer culture growing Proteus mirabilis and Acinetobacer complex.  Ceftaz/Flagyl switched to meropenem and continuing daptomycin.  Pharmacy has been consulted for meropenem dosing now in addition to daptomycin.    Plan: Meropenem 1g IV q8h. Continue daptomycin 360mg  IV q24h (6mg /kg) CK weekly while on daptomycin Follow renal function and cultures  Height: 5\' 3"  (160 cm) Weight: 131 lb 9.8 oz (59.7 kg) IBW/kg (Calculated) : 52.4  Temp (24hrs), Avg:98.4 F (36.9 C), Min:97.6 F (36.4 C), Max:98.9 F (37.2 C)  Recent Labs  Lab 02/13/17 1935 02/15/17 0521 02/16/17 0649 02/19/17 0512 02/20/17 0557  WBC 7.7 6.3  --  5.7 4.3  CREATININE 0.31* 0.44 0.32* <0.30*  --   LATICACIDVEN  --  0.8  --   --   --     CrCl cannot be calculated (This lab value cannot be used to calculate CrCl because it is not a number: <0.30).    Allergies  Allergen Reactions  . Iodine Anaphylaxis  . Latex Anaphylaxis  . Other Anaphylaxis  . Shellfish-Derived Products Anaphylaxis    Reaction unknown  . Vancomycin Anaphylaxis  . Aspirin Other (See Comments)    Reaction unknown  . Strawberry Extract Hives and Rash    Antimicrobials this admission:  11/19 Ceftazidime >> 11/21 11/19 Daptomycin >> 11/21 Meropenem >>  Dose adjustments this admission:   Microbiology results:  11/19 Sacral ulcer (soft tissue): Proteus mirabilis, Acinetobacter calcoaceticus/baumannii complex 11/19 Sacral ulcer (fluid): Proteus mirabilis 11/15 MRSA PCR: negative 11/14 Wet prep: moderate WBC  Thank you for allowing pharmacy to be a part of this patient's care.  Hershal Coria, PharmD, BCPS Pager: [phone] 02/20/2017 2:59 PM

## 2017-02-20 NOTE — Progress Notes (Signed)
Subjective:  No new complaints   Antibiotics:  Anti-infectives (From admission, onward)   Start     Dose/Rate Route Frequency Ordered Stop   02/20/17 1600  meropenem (MERREM) 1 g in sodium chloride 0.9 % 100 mL IVPB     1 g 200 mL/hr over 30 Minutes Intravenous Every 8 hours 02/20/17 1456     02/19/17 2200  metroNIDAZOLE (FLAGYL) tablet 500 mg  Status:  Discontinued     500 mg Oral Every 8 hours 02/19/17 2043 02/20/17 1430   02/18/17 1800  cefTAZidime (FORTAZ) 2 g in dextrose 5 % 50 mL IVPB  Status:  Discontinued     2 g 100 mL/hr over 30 Minutes Intravenous Every 8 hours 02/18/17 1631 02/20/17 1454   02/18/17 1800  DAPTOmycin (CUBICIN) 360 mg in sodium chloride 0.9 % IVPB     360 mg 214.4 mL/hr over 30 Minutes Intravenous Every 24 hours 02/18/17 1633        Medications: Scheduled Meds: . baclofen  20 mg Oral TID  . bisacodyl  10 mg Rectal Daily  . enoxaparin (LOVENOX) injection  40 mg Subcutaneous Q24H  . gabapentin  800 mg Oral TID  . lactose free nutrition  237 mL Oral TID WC  . lamoTRIgine  125 mg Oral Daily  . lubiprostone  24 mcg Oral Q breakfast  . multivitamin  15 mL Oral Daily  . pantoprazole  20 mg Oral Daily  . senna-docusate  1 tablet Oral BID  . sertraline  100 mg Oral Daily   Continuous Infusions: . 0.9 % NaCl with KCl 20 mEq / L 100 mL/hr at 02/20/17 1414  . DAPTOmycin (CUBICIN)  IV Stopped (02/19/17 1734)  . meropenem (MERREM) IV Stopped (02/20/17 1638)   PRN Meds:.diazepam, fluticasone, ibuprofen, ondansetron (ZOFRAN) IV    Objective: Weight change:   Intake/Output Summary (Last 24 hours) at 02/20/2017 1748 Last data filed at 02/20/2017 1400 Gross per 24 hour  Intake 2735.53 ml  Output 2925 ml  Net -189.47 ml   Blood pressure (!) 99/54, pulse 92, temperature 98.9 F (37.2 C), temperature source Oral, resp. rate 18, height 5\' 3"  (1.6 m), weight 131 lb 9.8 oz (59.7 kg), SpO2 100 %. Temp:  [97.6 F (36.4 C)-98.9 F (37.2 C)] 98.9  F (37.2 C) (11/21 1334) Pulse Rate:  [92-107] 92 (11/21 1334) Resp:  [18] 18 (11/21 1334) BP: (94-105)/(54-57) 99/54 (11/21 1334) SpO2:  [99 %-100 %] 100 % (11/21 1334)  Physical Exam: General: Alert and awake, oriented x3, not in any acute distress. CV: rr Pulm; no wheezes, resp distress Neuro: no new focal deficits  CBC:  CBC Latest Ref Rng & Units 02/20/2017 02/19/2017 02/15/2017  WBC 4.0 - 10.5 K/uL 4.3 5.7 6.3  Hemoglobin 12.0 - 15.0 g/dL 7.7(L) 8.3(L) 9.2(L)  Hematocrit 36.0 - 46.0 % 26.8(L) 29.2(L) 32.7(L)  Platelets 150 - 400 K/uL 196 208 191     BMET Recent Labs    02/19/17 0512  NA 141  K 4.1  CL 104  CO2 28  GLUCOSE 92  BUN 10  CREATININE <0.30*  CALCIUM 9.0     Liver Panel  No results for input(s): PROT, ALBUMIN, AST, ALT, ALKPHOS, BILITOT, BILIDIR, IBILI in the last 72 hours.     Sedimentation Rate No results for input(s): ESRSEDRATE in the last 72 hours. C-Reactive Protein No results for input(s): CRP in the last 72 hours.  Micro Results: Recent Results (from the past 720  hour(s))  Wet prep, genital     Status: Abnormal   Collection Time: 02/13/17  6:38 PM  Result Value Ref Range Status   Yeast Wet Prep HPF POC NONE SEEN NONE SEEN Final   Trich, Wet Prep NONE SEEN NONE SEEN Final   Clue Cells Wet Prep HPF POC NONE SEEN NONE SEEN Final   WBC, Wet Prep HPF POC MODERATE (A) NONE SEEN Final   Sperm NONE SEEN  Final  MRSA PCR Screening     Status: None   Collection Time: 02/14/17 11:20 AM  Result Value Ref Range Status   MRSA by PCR NEGATIVE NEGATIVE Final    Comment:        The GeneXpert MRSA Assay (FDA approved for NASAL specimens only), is one component of a comprehensive MRSA colonization surveillance program. It is not intended to diagnose MRSA infection nor to guide or monitor treatment for MRSA infections.   Aerobic/Anaerobic Culture (surgical/deep wound)     Status: None (Preliminary result)   Collection Time: 02/18/17  12:02 PM  Result Value Ref Range Status   Specimen Description TISSUE SOFT LT SACRAL ULCER  Final   Special Requests TISSUE SENT INSIDE SWABS  Final   Gram Stain   Final    FEW WBC PRESENT,BOTH PMN AND MONONUCLEAR ABUNDANT GRAM POSITIVE COCCI ABUNDANT GRAM NEGATIVE RODS ABUNDANT GRAM POSITIVE RODS Performed at [facility], [place] [address] [place], [place] [address]    Culture   Final    ABUNDANT PROTEUS MIRABILIS ABUNDANT ACINETOBACTER CALCOACETICUS/BAUMANNII COMPLEX    Report Status PENDING  Incomplete  Aerobic/Anaerobic Culture (surgical/deep wound)     Status: None (Preliminary result)   Collection Time: 02/18/17 12:14 PM  Result Value Ref Range Status   Specimen Description FLUID SACRAL ULCER  Final   Special Requests NONE  Final   Gram Stain   Final    RARE WBC PRESENT,BOTH PMN AND MONONUCLEAR MODERATE GRAM POSITIVE COCCI MODERATE GRAM POSITIVE RODS    Culture   Final    MODERATE PROTEUS MIRABILIS SUSCEPTIBILITIES TO FOLLOW Performed at [facility], [place] [address], [place], [place] [address]    Report Status PENDING  Incomplete    Studies/Results: No results found.    Assessment/Plan:  INTERVAL HISTORY: growing Proteus and Acinetobacter and 3rd organism predominating on culture   Principal Problem:   Decubitus ulcer Active Problems:   Paraplegia ([place])   History of transverse myelitis   Osteomyelitis, pelvis ([place])   Wound disruption   MRSA infection    [name] is a 36 y.o. 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 Excell Seltzer of skin, subcutaneous tissue and periosteum left ischial decubitus ulcer--> cultures. There was also fair amout of tunneling with purulence that was swabbed for culture.  I discussed with microbiology lab and specimen was  going to be read as multiple organisms none predominate, but after discussion there were 3 organisms that were predominating. Since then a proteus that was swarming the plate and an acinetbacter have been identified. There may also be a gram + organism growing as well  The finding of an Acinetobacter is unfortunate. HOPEFULLY it is S to carbapenem or something less toxic than colistin     Dr. Johnnye Sima will be available for questions November 22nd through 25th.  LOS: 4 days   Alcide Evener 02/20/2017, 5:48 PM

## 2017-02-20 NOTE — Clinical Social Work Note (Signed)
Clinical Social Work Assessment  Patient Details  Name: [name] MRN: [id number] Date of Birth: 1980-09-03  Date of referral:  02/20/17               Reason for consult:  [employer] sought to share information with:  Family Supports Permission granted to share information::  Yes, Verbal Permission Granted  Name::        Agency::     Relationship::  Mother  Contact Information:     Housing/Transportation Living arrangements for the past 2 months:  Single Family Home Source of Information:  Patient, Parent Patient Interpreter Needed:  None Criminal Activity/Legal Involvement Pertinent to Current Situation/Hospitalization:  No - Comment as needed Significant Relationships:  Parents Lives with:  Parents Do you feel safe going back to the place where you live?  Yes Need for family participation in patient care:  Yes (Comment)  Care giving concerns: Patient mother requested to speak with CSW about community resources that may help pay for refrigeration in the home.  She reports they have not had refrigeration for two weeks and was not expecting to have to buy one so quickly.   Patient tearful and upset with the thought of having to go to SNF. She reports having "terrible experience".  Patient mother express concerns with her own health issues and being to get the refrigerator in time before the patient is discharged.   Patient expressed she left a  voicemail for the social worker-Veronica at [employer] for [employer] about not having a refrigerator. She reports the agency would be closed until Monday.   Social Worker assessment / plan:  CSW met with the patient and mother at bedside, explain role. Patient expressed concerns with discharging to a SNF for antibiotics. Patient mother expressed concerns with not being able to get refrigerator in time.   CSW offered community resources, pt. Mother reports she had already called several places listed.   11/20 CSW called Mrs. Merrilyn Puma w/ Care Management at [facility] for communities. She provided Lobbyist.  [name]-[phone]. [phone] The social worker is unavailable until Monday.   Plan: She plans to purchase the refrigerator today 11/21, and is hopeful it will arrive before the patient is discharged.   Employment status:  Disabled (Comment on whether or not currently receiving Disability) Insurance information:  Medicaid In [place] PT Recommendations:  Not assessed at this time Information / Referral to community resources:   Resources for financial assistance  Patient/Family's Response to care:  Responding to care, prefers to go home with IV antibiotics.   Patient/Family's Understanding of and Emotional Response to Diagnosis, Current Treatment, and Prognosis: Patient expressed, " I am capable of telling the physician what I need and desire, its just sometimes I feel like I don't have options." Patient understands her diagnosis and care and is hopeful to continue her treatment at home.   Emotional Assessment Appearance:  Appears stated age Attitude/Demeanor/Rapport:    Affect (typically observed):  Calm Orientation:  Oriented to Self, Oriented to Place, Oriented to  Time, Oriented to Situation Alcohol / Substance use:  Not Applicable Psych involvement (Current and /or in the community):  No (Comment)  Discharge Needs  Concerns to be addressed:  Discharge Planning Concerns Readmission within the last 30 days:  No Current discharge risk:  Physical Impairment, Dependent with Mobility Barriers to Discharge:  Continued Medical Work up, Other(May not  have refrigeration at discharge. )   Lia Hopping, LCSW 02/20/2017, 2:04 PM

## 2017-02-20 NOTE — Progress Notes (Signed)
[facility] PROGRESS NOTE    Progress Note  [name]  ION:[id number] DOB: [birth year] DOA: 02/13/2017 PCP: Lennie Odor, PA-C     Brief Narrative:   [name] is an 36 y.o. female past medical history of multiple sclerosis transverse myelitis bedbound with chronic sacral decubitus ulcer who comes in for foul-smelling discharge of the wound and enlarging  Assessment/Plan:   Chronic sacral Decubitus ulcer: MRI done on 12/16/2016 that showed early osteomyelitis. ESR and CRP were mildly elevated. Gen. surgery and ID was consulted, she is status post debridement on 02/18/2017 cultures have remained negative till date. ID recommended to await cultures if negative would recommend iv antibiotics for 6 weeks with IV Cubicin, ceftazidime and oral Flagyl.  Multiple sclerosis and paraplegia: Wheelchair-bound continue supportive care.  Constipation: Continue bowel regimen.  Hypokalemia: Resolved.  Chronic microcytic anemia: Only to follow-up with primary care doctor as an outpatient. Repeat CBC in the morning hemoglobin is slowly trending down  Osteomyelitis, pelvis (HCC) As above.   DVT prophylaxis: lovenox Family Communication:none Disposition Plan/Barrier to D/C: home in 1-2 days Code Status:     Code Status Orders  (From admission, onward)        Start     Ordered   02/14/17 0101  Full code  Continuous     02/14/17 0100    Code Status History    Date Active Date Inactive Code Status Order ID Comments User Context   11/15/2016 02:45 11/17/2016 18:48 Full Code [id number]  [name], MD Inpatient   07/17/2016 23:21 07/19/2016 20:47 Full Code [id number]  Ivor Costa, MD ED   02/15/2016 22:17 02/25/2016 20:01 Full Code [id number]  [name], MD Inpatient   01/07/2016 09:58 01/09/2016 14:24 Full Code [id number]  [name], MD Inpatient   10/29/2014 21:14 10/31/2014 17:11 Full Code [id number]  [name], MD Inpatient   09/27/2014  22:49 09/28/2014 20:46 Full Code [id number]  [name], DO ED   03/30/2013 12:15 04/02/2013 17:32 Full Code [id number]  [name], MD Inpatient   02/16/2012 01:46 02/18/2012 18:53 Full Code [id number]  [name]., DO ED        IV Access:    Peripheral IV   Procedures and diagnostic studies:   No results found.   Medical Consultants:    None.  Anti-Infectives:   Continue IV ceftazidime and daptomycin along with Flagyl.  Subjective:    [name] she relates no further foul-smelling drainage, she has no new complaints.  Objective:    Vitals:   02/19/17 1026 02/19/17 1340 02/19/17 2230 02/20/17 0615  BP: (!) 97/54 105/68 (!) 105/57 (!) 94/54  Pulse: 95 99 (!) 107 94  Resp: 18 17 18 18   Temp: 97.6 F (36.4 C) 98.7 F (37.1 C) 98.6 F (37 C) 97.6 F (36.4 C)  TempSrc: Oral Oral Oral Oral  SpO2: 100% 100% 100% 99%  Weight:      Height:        Intake/Output Summary (Last 24 hours) at 02/20/2017 1017 Last data filed at 02/20/2017 0615 Gross per 24 hour  Intake 2235.53 ml  Output 2025 ml  Net 210.53 ml   Filed Weights   02/14/17 0057  Weight: 59.7 kg (131 lb 9.8 oz)    Exam: General exam: In no acute distress. Respiratory system: Good air movement and clear to auscultation. Cardiovascular system: S1 & S2 heard, RRR. No JVD, murmurs, rubs, gallops or clicks.  Gastrointestinal system: Abdomen is nondistended, soft and  nontender.  Extremities: No pedal edema. Psychiatry: Judgement and insight appear normal. Mood & affect appropriate.    Data Reviewed:    Labs: Basic Metabolic Panel: Recent Labs  Lab 02/13/17 1935 02/15/17 0521 02/16/17 0649 02/19/17 0512  NA 138 142 140 141  K 3.7 3.2* 4.0 4.1  CL 106 104 103 104  CO2 25 29 27 28   GLUCOSE 91 94 95 92  BUN 11 9 10 10   CREATININE 0.31* 0.44 0.32* <0.30*  CALCIUM 8.8* 9.1 9.3 9.0  MG  --  2.1  --   --    GFR CrCl cannot be calculated (This lab value cannot be used  to calculate CrCl because it is not a number: <0.30). Liver Function Tests: Recent Labs  Lab 02/13/17 1935  AST 16  ALT 11*  ALKPHOS 110  BILITOT 0.6  PROT 7.0  ALBUMIN 4.2   No results for input(s): LIPASE, AMYLASE in the last 168 hours. No results for input(s): AMMONIA in the last 168 hours. Coagulation profile No results for input(s): INR, PROTIME in the last 168 hours.  CBC: Recent Labs  Lab 02/13/17 1935 02/15/17 0521 02/19/17 0512 02/20/17 0557  WBC 7.7 6.3 5.7 4.3  NEUTROABS  --  4.5  --   --   HGB 9.3* 9.2* 8.3* 7.7*  HCT 32.3* 32.7* 29.2* 26.8*  MCV 71.1* 72.0* 73.4* 74.0*  PLT 179 191 208 196   Cardiac Enzymes: Recent Labs  Lab 02/18/17 1630  CKTOTAL 21*   BNP (last 3 results) No results for input(s): PROBNP in the last 8760 hours. CBG: No results for input(s): GLUCAP in the last 168 hours. D-Dimer: No results for input(s): DDIMER in the last 72 hours. Hgb A1c: No results for input(s): HGBA1C in the last 72 hours. Lipid Profile: No results for input(s): CHOL, HDL, LDLCALC, TRIG, CHOLHDL, LDLDIRECT in the last 72 hours. Thyroid function studies: No results for input(s): TSH, T4TOTAL, T3FREE, THYROIDAB in the last 72 hours.  Invalid input(s): FREET3 Anemia work up: No results for input(s): VITAMINB12, FOLATE, FERRITIN, TIBC, IRON, RETICCTPCT in the last 72 hours. Sepsis Labs: Recent Labs  Lab 02/13/17 1935 02/15/17 0521 02/19/17 0512 02/20/17 0557  WBC 7.7 6.3 5.7 4.3  LATICACIDVEN  --  0.8  --   --    Microbiology Recent Results (from the past 240 hour(s))  Wet prep, genital     Status: Abnormal   Collection Time: 02/13/17  6:38 PM  Result Value Ref Range Status   Yeast Wet Prep HPF POC NONE SEEN NONE SEEN Final   Trich, Wet Prep NONE SEEN NONE SEEN Final   Clue Cells Wet Prep HPF POC NONE SEEN NONE SEEN Final   WBC, Wet Prep HPF POC MODERATE (A) NONE SEEN Final   Sperm NONE SEEN  Final  MRSA PCR Screening     Status: None    Collection Time: 02/14/17 11:20 AM  Result Value Ref Range Status   MRSA by PCR NEGATIVE NEGATIVE Final    Comment:        The GeneXpert MRSA Assay (FDA approved for NASAL specimens only), is one component of a comprehensive MRSA colonization surveillance program. It is not intended to diagnose MRSA infection nor to guide or monitor treatment for MRSA infections.   Aerobic/Anaerobic Culture (surgical/deep wound)     Status: None (Preliminary result)   Collection Time: 02/18/17 12:02 PM  Result Value Ref Range Status   Specimen Description TISSUE SOFT LT SACRAL ULCER  Final  Special Requests TISSUE SENT INSIDE SWABS  Final   Gram Stain   Final    FEW WBC PRESENT,BOTH PMN AND MONONUCLEAR ABUNDANT GRAM POSITIVE COCCI ABUNDANT GRAM NEGATIVE RODS ABUNDANT GRAM POSITIVE RODS    Culture   Final    CULTURE REINCUBATED FOR BETTER GROWTH Performed at [facility], [place] [address] [place], [place] [address]    Report Status PENDING  Incomplete  Aerobic/Anaerobic Culture (surgical/deep wound)     Status: None (Preliminary result)   Collection Time: 02/18/17 12:14 PM  Result Value Ref Range Status   Specimen Description FLUID SACRAL ULCER  Final   Special Requests NONE  Final   Gram Stain   Final    RARE WBC PRESENT,BOTH PMN AND MONONUCLEAR MODERATE GRAM POSITIVE COCCI MODERATE GRAM POSITIVE RODS    Culture   Final    CULTURE REINCUBATED FOR BETTER GROWTH Performed at [facility], [place] [address] [place], [place] [address]    Report Status PENDING  Incomplete     Medications:   . baclofen  20 mg Oral TID  . bisacodyl  10 mg Rectal Daily  . enoxaparin (LOVENOX) injection  40 mg Subcutaneous Q24H  . gabapentin  800 mg Oral TID  . lactose free nutrition  237 mL Oral TID WC  . lamoTRIgine  125 mg Oral Daily  . lubiprostone  24 mcg Oral Q breakfast  . metroNIDAZOLE  500 mg Oral Q8H  . multivitamin  15 mL Oral Daily  . pantoprazole  20 mg Oral Daily  .  senna-docusate  1 tablet Oral BID  . sertraline  100 mg Oral Daily   Continuous Infusions: . 0.9 % NaCl with KCl 20 mEq / L 100 mL/hr at 02/20/17 0230  . cefTAZidime (FORTAZ)  IV Stopped (02/20/17 0300)  . DAPTOmycin (CUBICIN)  IV Stopped (02/19/17 1734)     LOS: 4 days   Charlynne Cousins  [facility] Pager [phone]  *Please refer to [email or web address], password [email or web address] to get updated schedule on who will round on this patient, as hospitalists switch teams weekly. If 7PM-7AM, please contact night-coverage at [email or web address], password [email or web address] for any overnight needs.  02/20/2017, 10:17 AM

## 2017-02-20 NOTE — Progress Notes (Signed)
Patient ID: [name], female   DOB: Sep 27, 1980, 36 y.o.   MRN: [id number]  [facility] Surgery Progress Note  2 Days Post-Op  Subjective: CC-  Doing well today. No complaints. Tolerating dressing changes. WBC WNL, afebrile.  Objective: Vital signs in last 24 hours: Temp:  [97.6 F (36.4 C)-98.7 F (37.1 C)] 97.6 F (36.4 C) (11/21 0615) Pulse Rate:  [94-107] 94 (11/21 0615) Resp:  [17-18] 18 (11/21 0615) BP: (94-105)/(54-68) 94/54 (11/21 0615) SpO2:  [99 %-100 %] 99 % (11/21 0615) Last BM Date: 02/17/17  Intake/Output from previous day: 11/20 0701 - 11/21 0700 In: 2535.5 [P.O.:100; I.V.:2178.3; IV Piggyback:257.2] Out: 2025 [Urine:2025] Intake/Output this shift: No intake/output data recorded.  PE: Gen:  Alert, NAD, pleasant HEENT: EOM's intact, pupils equal and round Pulm:  effort normal Psych: A&Ox3  GU: left ischial wound s/p I&D with healthy red/pink tissue, no purulent drainage or surrounding erythema, proximal aspect has about 3cm tunneling     Lab Results:  Recent Labs    02/19/17 0512 02/20/17 0557  WBC 5.7 4.3  HGB 8.3* 7.7*  HCT 29.2* 26.8*  PLT 208 196   BMET Recent Labs    02/19/17 0512  NA 141  K 4.1  CL 104  CO2 28  GLUCOSE 92  BUN 10  CREATININE <0.30*  CALCIUM 9.0   PT/INR No results for input(s): LABPROT, INR in the last 72 hours. CMP     Component Value Date/Time   NA 141 02/19/2017 0512   K 4.1 02/19/2017 0512   CL 104 02/19/2017 0512   CO2 28 02/19/2017 0512   GLUCOSE 92 02/19/2017 0512   BUN 10 02/19/2017 0512   CREATININE <0.30 (L) 02/19/2017 0512   CREATININE 0.41 (L) 03/22/2016 1205   CALCIUM 9.0 02/19/2017 0512   PROT 7.0 02/13/2017 1935   ALBUMIN 4.2 02/13/2017 1935   AST 16 02/13/2017 1935   ALT 11 (L) 02/13/2017 1935   ALKPHOS 110 02/13/2017 1935   BILITOT 0.6 02/13/2017 1935   GFRNONAA NOT CALCULATED 02/19/2017 0512   GFRAA NOT CALCULATED 02/19/2017 0512   Lipase     Component Value  Date/Time   LIPASE 22 10/17/2016 1043       Studies/Results: No results found.  Anti-infectives: Anti-infectives (From admission, onward)   Start     Dose/Rate Route Frequency Ordered Stop   02/19/17 2200  metroNIDAZOLE (FLAGYL) tablet 500 mg     500 mg Oral Every 8 hours 02/19/17 2043     02/18/17 1800  cefTAZidime (FORTAZ) 2 g in dextrose 5 % 50 mL IVPB     2 g 100 mL/hr over 30 Minutes Intravenous Every 8 hours 02/18/17 1631     02/18/17 1800  DAPTOmycin (CUBICIN) 360 mg in sodium chloride 0.9 % IVPB     360 mg 214.4 mL/hr over 30 Minutes Intravenous Every 24 hours 02/18/17 1633         Assessment/Plan Multiple sclerosis/transverse myelitis and paraplegia Constipation Chronic microcytic anemia/iron deficiency anemia  Left ischial decubitus ulcer S/p Debridement skin, subcutaneous tissue and periosteum left ischial decubitus ulcer 11/19 Dr. Excell Seltzer - POD 2 - cultures pending  ID - fortaz 11/19>>day#3, daptomycin 11/19>>day#3, flagyl 11/20>>day#2 FEN - regular diet VTE - SCDs, lovenox Foley - none Follow up - wound clinic  Plan - Wound healing well, will not need further surgical debridement. Continue BID wet to dry dressing changes. Continue antibiotics per ID and follow culture.    LOS: 4 days  Wellington Hampshire , [facility] Surgery 02/20/2017, 8:52 AM Pager: [phone] Consults: [phone] Mon-Fri 7:00 am-4:30 pm Sat-Sun 7:00 am-11:30 am

## 2017-02-20 NOTE — Telephone Encounter (Signed)
Patient called and left message stating she saw Dr Hulan Fray on 10/29 for a endometrial biopsy. Patient states she just had surgery and is going to be in the hospital and bed ridden for a while and will likely not be able to make a soon follow up appt. Patient is requesting results. Called patient and informed her of negative results. Patient verbalized understanding and states she doesn't think she can make her appt because she isn't sure when she is getting discharged and when she does she will be bed ridden at home for 2 weeks with IV antibiotics. Told patient I will let our front office staff know and they will reschedule your appt for a later date. Patient verbalized understanding & had no questions

## 2017-02-21 NOTE — Progress Notes (Signed)
[facility] PROGRESS NOTE    Progress Note  [name]  IPJ:[id number] DOB: 08-20-1980 DOA: 02/13/2017 PCP: Lennie Odor, PA-C     Brief Narrative:   [name] is an 36 y.o. female past medical history of multiple sclerosis transverse myelitis bedbound with chronic sacral decubitus ulcer who comes in for foul-smelling discharge of the wound and enlarging  Assessment/Plan:   Chronic sacral Decubitus ulcer: MRI done on 12/16/2016 that showed early osteomyelitis. Gen. surgery and ID was consulted, she is status post debridement on 02/18/2017. ID recommended wound culture grew Acinetobacter and gram positives cocci and rods continue iv antibiotics for 6 weeks with IV Cubicin, meropenem.  Multiple sclerosis and paraplegia: Wheelchair-bound continue supportive care.  Constipation: Continue bowel regimen.  Hypokalemia: Resolved.  Chronic microcytic anemia: Only to follow-up with primary care doctor as an outpatient. CBC today shows a hemoglobin continues to drift down, we will continue to monitor it closely. Pediatrics 16 the morning.  Osteomyelitis, pelvis ([place]) As above.   DVT prophylaxis: lovenox Family Communication:none Disposition Plan/Barrier to D/C: home in 3-4 days Code Status:     Code Status Orders  (From admission, onward)        Start     Ordered   02/14/17 0101  Full code  Continuous     02/14/17 0100    Code Status History    Date Active Date Inactive Code Status Order ID Comments User Context   11/15/2016 02:45 11/17/2016 18:48 Full Code [id number]  [name], MD Inpatient   07/17/2016 23:21 07/19/2016 20:47 Full Code [id number]  Ivor Costa, MD ED   02/15/2016 22:17 02/25/2016 20:01 Full Code [id number]  [name], MD Inpatient   01/07/2016 09:58 01/09/2016 14:24 Full Code [id number]  [name], MD Inpatient   10/29/2014 21:14 10/31/2014 17:11 Full Code [id number]  [name], MD Inpatient   09/27/2014 22:49  09/28/2014 20:46 Full Code [id number]  [name], DO ED   03/30/2013 12:15 04/02/2013 17:32 Full Code [id number]  [name], MD Inpatient   02/16/2012 01:46 02/18/2012 18:53 Full Code [id number]  [name]., DO ED        IV Access:    Peripheral IV   Procedures and diagnostic studies:   No results found.   Medical Consultants:    None.  Anti-Infectives:   Continue IV Meropenem and daptomycin along with Flagyl.  Subjective:    [name] she relates no further foul-smelling drainage, she has no new complaints.  Objective:    Vitals:   02/20/17 0615 02/20/17 1334 02/20/17 2247 02/21/17 0601  BP: (!) 94/54 (!) 99/54 99/63 101/65  Pulse: 94 92 97 84  Resp: 18 18 18 18   Temp: 97.6 F (36.4 C) 98.9 F (37.2 C) 97.7 F (36.5 C) 97.8 F (36.6 C)  TempSrc: Oral Oral Oral Oral  SpO2: 99% 100% 100% 99%  Weight:      Height:        Intake/Output Summary (Last 24 hours) at 02/21/2017 0915 Last data filed at 02/21/2017 0603 Gross per 24 hour  Intake 3867.2 ml  Output 3075 ml  Net 792.2 ml   Filed Weights   02/14/17 0057  Weight: 59.7 kg (131 lb 9.8 oz)    Exam: General exam: In no acute distress. Respiratory system: Good air movement and clear to auscultation. Cardiovascular system: S1 & S2 heard, RRR. No JVD, murmurs, rubs, gallops or clicks.  Gastrointestinal system: Abdomen is nondistended, soft and nontender.  Extremities:  No pedal edema. Psychiatry: Judgement and insight appear normal. Mood & affect appropriate.    Data Reviewed:    Labs: Basic Metabolic Panel: Recent Labs  Lab 02/15/17 0521 02/16/17 0649 02/19/17 0512  NA 142 140 141  K 3.2* 4.0 4.1  CL 104 103 104  CO2 29 27 28   GLUCOSE 94 95 92  BUN 9 10 10   CREATININE 0.44 0.32* <0.30*  CALCIUM 9.1 9.3 9.0  MG 2.1  --   --    GFR CrCl cannot be calculated (This lab value cannot be used to calculate CrCl because it is not a number: <0.30). Liver Function  Tests: No results for input(s): AST, ALT, ALKPHOS, BILITOT, PROT, ALBUMIN in the last 168 hours. No results for input(s): LIPASE, AMYLASE in the last 168 hours. No results for input(s): AMMONIA in the last 168 hours. Coagulation profile No results for input(s): INR, PROTIME in the last 168 hours.  CBC: Recent Labs  Lab 02/15/17 0521 02/19/17 0512 02/20/17 0557  WBC 6.3 5.7 4.3  NEUTROABS 4.5  --   --   HGB 9.2* 8.3* 7.7*  HCT 32.7* 29.2* 26.8*  MCV 72.0* 73.4* 74.0*  PLT 191 208 196   Cardiac Enzymes: Recent Labs  Lab 02/18/17 1630  CKTOTAL 21*   BNP (last 3 results) No results for input(s): PROBNP in the last 8760 hours. CBG: No results for input(s): GLUCAP in the last 168 hours. D-Dimer: No results for input(s): DDIMER in the last 72 hours. Hgb A1c: No results for input(s): HGBA1C in the last 72 hours. Lipid Profile: No results for input(s): CHOL, HDL, LDLCALC, TRIG, CHOLHDL, LDLDIRECT in the last 72 hours. Thyroid function studies: No results for input(s): TSH, T4TOTAL, T3FREE, THYROIDAB in the last 72 hours.  Invalid input(s): FREET3 Anemia work up: No results for input(s): VITAMINB12, FOLATE, FERRITIN, TIBC, IRON, RETICCTPCT in the last 72 hours. Sepsis Labs: Recent Labs  Lab 02/15/17 0521 02/19/17 0512 02/20/17 0557  WBC 6.3 5.7 4.3  LATICACIDVEN 0.8  --   --    Microbiology Recent Results (from the past 240 hour(s))  Wet prep, genital     Status: Abnormal   Collection Time: 02/13/17  6:38 PM  Result Value Ref Range Status   Yeast Wet Prep HPF POC NONE SEEN NONE SEEN Final   Trich, Wet Prep NONE SEEN NONE SEEN Final   Clue Cells Wet Prep HPF POC NONE SEEN NONE SEEN Final   WBC, Wet Prep HPF POC MODERATE (A) NONE SEEN Final   Sperm NONE SEEN  Final  MRSA PCR Screening     Status: None   Collection Time: 02/14/17 11:20 AM  Result Value Ref Range Status   MRSA by PCR NEGATIVE NEGATIVE Final    Comment:        The GeneXpert MRSA Assay (FDA approved  for NASAL specimens only), is one component of a comprehensive MRSA colonization surveillance program. It is not intended to diagnose MRSA infection nor to guide or monitor treatment for MRSA infections.   Aerobic/Anaerobic Culture (surgical/deep wound)     Status: None (Preliminary result)   Collection Time: 02/18/17 12:02 PM  Result Value Ref Range Status   Specimen Description TISSUE SOFT LT SACRAL ULCER  Final   Special Requests TISSUE SENT INSIDE SWABS  Final   Gram Stain   Final    FEW WBC PRESENT,BOTH PMN AND MONONUCLEAR ABUNDANT GRAM POSITIVE COCCI ABUNDANT GRAM NEGATIVE RODS ABUNDANT GRAM POSITIVE RODS Performed at [facility],  [address] [place], [place] [address]    Culture   Final    ABUNDANT PROTEUS MIRABILIS ABUNDANT ACINETOBACTER CALCOACETICUS/BAUMANNII COMPLEX    Report Status PENDING  Incomplete  Aerobic/Anaerobic Culture (surgical/deep wound)     Status: None (Preliminary result)   Collection Time: 02/18/17 12:14 PM  Result Value Ref Range Status   Specimen Description FLUID SACRAL ULCER  Final   Special Requests NONE  Final   Gram Stain   Final    RARE WBC PRESENT,BOTH PMN AND MONONUCLEAR MODERATE GRAM POSITIVE COCCI MODERATE GRAM POSITIVE RODS    Culture   Final    MODERATE PROTEUS MIRABILIS SUSCEPTIBILITIES TO FOLLOW Performed at [facility], [place] [address] [place], [place] [address]    Report Status PENDING  Incomplete     Medications:   . baclofen  20 mg Oral TID  . bisacodyl  10 mg Rectal Daily  . enoxaparin (LOVENOX) injection  40 mg Subcutaneous Q24H  . gabapentin  800 mg Oral TID  . lactose free nutrition  237 mL Oral TID WC  . lamoTRIgine  125 mg Oral Daily  . lubiprostone  24 mcg Oral Q breakfast  . multivitamin  15 mL Oral Daily  . pantoprazole  20 mg Oral Daily  . senna-docusate  1 tablet Oral BID  . sertraline  100 mg Oral Daily   Continuous Infusions: . 0.9 % NaCl with KCl 20 mEq / L 100 mL/hr at 02/21/17  0406  . DAPTOmycin (CUBICIN)  IV 360 mg (02/20/17 1816)  . meropenem (MERREM) IV 1 g (02/21/17 0846)     LOS: 5 days   [facility] Pager [phone]  *Please refer to [email or web address], password [email or web address] to get updated schedule on who will round on this patient, as hospitalists switch teams weekly. If 7PM-7AM, please contact night-coverage at [email or web address], password [email or web address] for any overnight needs.  02/21/2017, 9:15 AM

## 2017-02-21 NOTE — Progress Notes (Signed)
  General Surgery [facility]  Assessment & Plan: Left ischial decubitus ulcer POD#3 - S/pDebridement skin, subcutaneous tissue and periosteum left ischial decubitus ulcer11/19 Dr. Excell Seltzer - dressing changes per nursing staff BID - abx Rx per ID consultant  Will follow up on Monday if patient still in-patient.  Otherwise, follow up at wound clinic.        Earnstine Regal, MD, [facility]       Office: [phone]    Chief Complaint: Left ischial decubitus ulcer  Subjective: Patient in bed, family at bedside.  Comfortable.  Dressing changes going well.  Objective: Vital signs in last 24 hours: Temp:  [97.7 F (36.5 C)-98.9 F (37.2 C)] 97.8 F (36.6 C) (11/22 0601) Pulse Rate:  [84-97] 84 (11/22 0601) Resp:  [18] 18 (11/22 0601) BP: (99-101)/(54-65) 101/65 (11/22 0601) SpO2:  [99 %-100 %] 99 % (11/22 0601) Last BM Date: 02/20/17  Intake/Output from previous day: 11/21 0701 - 11/22 0700 In: 3867.2 [P.O.:1160; I.V.:2400; IV Piggyback:307.2] Out: 3075 [Urine:3075] Intake/Output this shift: No intake/output data recorded.  Physical Exam: HEENT - sclerae clear, mucous membranes moist Ext - dressing left upper buttock/sacral dry and intact without drainage or odor Neuro - alert & oriented  Lab Results:  Recent Labs    02/19/17 0512 02/20/17 0557  WBC 5.7 4.3  HGB 8.3* 7.7*  HCT 29.2* 26.8*  PLT 208 196   BMET Recent Labs    02/19/17 0512  NA 141  K 4.1  CL 104  CO2 28  GLUCOSE 92  BUN 10  CREATININE <0.30*  CALCIUM 9.0   PT/INR No results for input(s): LABPROT, INR in the last 72 hours. Comprehensive Metabolic Panel:    Component Value Date/Time   NA 141 02/19/2017 0512   NA 140 02/16/2017 0649   K 4.1 02/19/2017 0512   K 4.0 02/16/2017 0649   CL 104 02/19/2017 0512   CL 103 02/16/2017 0649   CO2 28 02/19/2017 0512   CO2 27 02/16/2017 0649   BUN 10 02/19/2017 0512   BUN 10 02/16/2017  0649   CREATININE <0.30 (L) 02/19/2017 0512   CREATININE 0.32 (L) 02/16/2017 0649   CREATININE 0.41 (L) 03/22/2016 1205   GLUCOSE 92 02/19/2017 0512   GLUCOSE 95 02/16/2017 0649   CALCIUM 9.0 02/19/2017 0512   CALCIUM 9.3 02/16/2017 0649   AST 16 02/13/2017 1935   AST 11 (L) 11/15/2016 0456   ALT 11 (L) 02/13/2017 1935   ALT 9 (L) 11/15/2016 0456   ALKPHOS 110 02/13/2017 1935   ALKPHOS 85 11/15/2016 0456   BILITOT 0.6 02/13/2017 1935   BILITOT 0.7 11/15/2016 0456   PROT 7.0 02/13/2017 1935   PROT 6.1 (L) 11/15/2016 0456   ALBUMIN 4.2 02/13/2017 1935   ALBUMIN 3.3 (L) 11/15/2016 0456    Studies/Results: No results found.    [name] 02/21/2017  Patient ID: [name], female   DOB: [birth year], 36 y.o.   MRN: [id number]

## 2017-02-21 NOTE — Progress Notes (Signed)
[facility]  This hospital infusion coordinator met with pt and mother to review/teach IV ABX set up and administration for home.  Mother has given IV ABX in the past with [facility] and feels she can administer again at home this time post DC. Refrigeration at home  remains an issue to adequately store IV Daptomycin.  Pt has now started on Merrem which is stored at room temp. Mother states she has ordered a refrigerator which will be delivered on Tuesday of next week.  [facility] is prepared for pt DC when ordered by MD team. Dr. Tommy Medal has entered an OPAT order/consult for home IV ABX.  [facility] hospital team will follow while inpatient until DC.  If patient discharges after hours, please call [phone].   Larry Sierras 02/21/2017, 11:38 AM

## 2017-02-22 LAB — CBC
HEMATOCRIT: 27.1 % — AB (ref 36.0–46.0)
Hemoglobin: 8 g/dL — ABNORMAL LOW (ref 12.0–15.0)
MCH: 21.4 pg — AB (ref 26.0–34.0)
MCHC: 29.5 g/dL — AB (ref 30.0–36.0)
MCV: 72.5 fL — AB (ref 78.0–100.0)
PLATELETS: 242 10*3/uL (ref 150–400)
RBC: 3.74 MIL/uL — ABNORMAL LOW (ref 3.87–5.11)
RDW: 20.8 % — AB (ref 11.5–15.5)
WBC: 5.8 10*3/uL (ref 4.0–10.5)

## 2017-02-22 NOTE — Progress Notes (Signed)
Attempted upper left arm PICC placement. Unable to thread PICC after vein accessed per procedure. Recommend IR place PICC. Placed 20 g PIV in cephalic vein without difficulty. Small bruise noted after placement of PIV, great blood return and flushed well with NS 10 ml. No complaints of pain during insertion. Will continue to follow for other vascular access needs.

## 2017-02-22 NOTE — Progress Notes (Signed)
Called IR about PICC placement and was forwarded to PA Allred, then MD [place]. Was told that due to the holiday weekend the PICC line would not be able to be placed today. IV team consulted for additional PIV. MD aware of patient missing 8 AM dose of antibitotic. Will continue to monitor.

## 2017-02-22 NOTE — Progress Notes (Addendum)
[facility] PROGRESS NOTE    Progress Note  [name]  MHD:[id number] DOB: 01/31/1981 DOA: 02/13/2017 PCP: Lennie Odor, PA-C     Brief Narrative:   [name] is an 36 y.o. 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 Gen. surgery and ID was consulted, she is status post debridement on 02/18/2017. ID on board, wound culture grew Acinetobacter and gram positives cocci and rods continue iv antibiotics for 6 weeks with IV Cubicin, meropenem. Place PICC line, sensitivities are pending  Multiple sclerosis and paraplegia: Wheelchair-bound continue supportive care.  Constipation: Continue bowel regimen.  Hypokalemia: Resolved.  Chronic microcytic anemia: Only to follow-up with primary care doctor as an outpatient. Hemoglobin has remained stable.  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   DVT prophylaxis: lovenox Family Communication:none Disposition Plan/Barrier to D/C: home in 3-4 days Code Status:     Code Status Orders  (From admission, onward)        Start     Ordered   02/14/17 0101  Full code  Continuous     02/14/17 0100    Code Status History    Date Active Date Inactive Code Status Order ID Comments User Context   11/15/2016 02:45 11/17/2016 18:48 Full Code [id number]  [name], MD Inpatient   07/17/2016 23:21 07/19/2016 20:47 Full Code [id number]  Ivor Costa, MD ED   02/15/2016 22:17 02/25/2016 20:01 Full Code [id number]  [name], MD Inpatient   01/07/2016 09:58 01/09/2016 14:24 Full Code [id number]  [name], MD Inpatient   10/29/2014 21:14 10/31/2014 17:11 Full Code [id number]  [name], MD Inpatient   09/27/2014 22:49 09/28/2014 20:46 Full Code [id number]   [name], DO ED   03/30/2013 12:15 04/02/2013 17:32 Full Code [id number]  [name], MD Inpatient   02/16/2012 01:46 02/18/2012 18:53 Full Code [id number]  [name]., DO ED        IV Access:    Peripheral IV   Procedures and diagnostic studies:   No results found.   Medical Consultants:    None.  Anti-Infectives:   Continue IV Meropenem and daptomycin along with Flagyl.  Subjective:    [name] no new complaints.  Objective:    Vitals:   02/21/17 0601 02/21/17 1400 02/21/17 2058 02/22/17 0403  BP: 101/65 115/69 (!) 108/55 (!) 100/53  Pulse: 84 88 97 91  Resp: 18 18 18 18   Temp: 97.8 F (36.6 C) 98.2 F (36.8 C) 98.9 F (37.2 C) 98.1 F (36.7 C)  TempSrc: Oral Oral Oral Oral  SpO2: 99% 99% 100% 100%  Weight:      Height:        Intake/Output Summary (Last 24 hours) at 02/22/2017 0831 Last data filed at 02/22/2017 0600 Gross per 24 hour  Intake 3083.33 ml  Output 2550 ml  Net 533.33 ml   Filed Weights   02/14/17 0057  Weight: 59.7 kg (131 lb 9.8 oz)    Exam: General exam: In no acute distress. Respiratory system: Good air movement and clear to auscultation. Cardiovascular system: S1 & S2 heard, RRR. No JVD, murmurs, rubs, gallops or clicks.  Gastrointestinal system: Abdomen is nondistended, soft and nontender.  Extremities: No pedal edema. Psychiatry: Judgement and insight appear normal. Mood & affect appropriate.  Data Reviewed:    Labs: Basic Metabolic Panel: Recent Labs  Lab 02/16/17 0649 02/19/17 0512  NA 140 141  K 4.0 4.1  CL 103 104  CO2 27 28  GLUCOSE 95 92  BUN 10 10  CREATININE 0.32* <0.30*  CALCIUM 9.3 9.0   GFR CrCl cannot be calculated (This lab value cannot be used to calculate CrCl because it is not a number: <0.30). Liver Function Tests: No results for input(s): AST, ALT, ALKPHOS, BILITOT, PROT, ALBUMIN in the last 168 hours. No results for input(s): LIPASE, AMYLASE in the last  168 hours. No results for input(s): AMMONIA in the last 168 hours. Coagulation profile No results for input(s): INR, PROTIME in the last 168 hours.  CBC: Recent Labs  Lab 02/19/17 0512 02/20/17 0557 02/22/17 0353  WBC 5.7 4.3 5.8  HGB 8.3* 7.7* 8.0*  HCT 29.2* 26.8* 27.1*  MCV 73.4* 74.0* 72.5*  PLT 208 196 242   Cardiac Enzymes: Recent Labs  Lab 02/18/17 1630  CKTOTAL 21*   BNP (last 3 results) No results for input(s): PROBNP in the last 8760 hours. CBG: No results for input(s): GLUCAP in the last 168 hours. D-Dimer: No results for input(s): DDIMER in the last 72 hours. Hgb A1c: No results for input(s): HGBA1C in the last 72 hours. Lipid Profile: No results for input(s): CHOL, HDL, LDLCALC, TRIG, CHOLHDL, LDLDIRECT in the last 72 hours. Thyroid function studies: No results for input(s): TSH, T4TOTAL, T3FREE, THYROIDAB in the last 72 hours.  Invalid input(s): FREET3 Anemia work up: No results for input(s): VITAMINB12, FOLATE, FERRITIN, TIBC, IRON, RETICCTPCT in the last 72 hours. Sepsis Labs: Recent Labs  Lab 02/19/17 0512 02/20/17 0557 02/22/17 0353  WBC 5.7 4.3 5.8   Microbiology Recent Results (from the past 240 hour(s))  Wet prep, genital     Status: Abnormal   Collection Time: 02/13/17  6:38 PM  Result Value Ref Range Status   Yeast Wet Prep HPF POC NONE SEEN NONE SEEN Final   Trich, Wet Prep NONE SEEN NONE SEEN Final   Clue Cells Wet Prep HPF POC NONE SEEN NONE SEEN Final   WBC, Wet Prep HPF POC MODERATE (A) NONE SEEN Final   Sperm NONE SEEN  Final  MRSA PCR Screening     Status: None   Collection Time: 02/14/17 11:20 AM  Result Value Ref Range Status   MRSA by PCR NEGATIVE NEGATIVE Final    Comment:        The GeneXpert MRSA Assay (FDA approved for NASAL specimens only), is one component of a comprehensive MRSA colonization surveillance program. It is not intended to diagnose MRSA infection nor to guide or monitor treatment for MRSA  infections.   Aerobic/Anaerobic Culture (surgical/deep wound)     Status: None (Preliminary result)   Collection Time: 02/18/17 12:02 PM  Result Value Ref Range Status   Specimen Description TISSUE SOFT LT SACRAL ULCER  Final   Special Requests TISSUE SENT INSIDE SWABS  Final   Gram Stain   Final    FEW WBC PRESENT,BOTH PMN AND MONONUCLEAR ABUNDANT GRAM POSITIVE COCCI ABUNDANT GRAM NEGATIVE RODS ABUNDANT GRAM POSITIVE RODS    Culture   Final    ABUNDANT PROTEUS MIRABILIS ABUNDANT ACINETOBACTER CALCOACETICUS/BAUMANNII COMPLEX SUSCEPTIBILITIES TO FOLLOW HOLDING FOR POSSIBLE ANAEROBE Performed at [facility], [place] [address] [place], [place] [address]    Report Status PENDING  Incomplete  Aerobic/Anaerobic Culture (surgical/deep wound)     Status: None (Preliminary result)  Collection Time: 02/18/17 12:14 PM  Result Value Ref Range Status   Specimen Description FLUID SACRAL ULCER  Final   Special Requests NONE  Final   Gram Stain   Final    RARE WBC PRESENT,BOTH PMN AND MONONUCLEAR MODERATE GRAM POSITIVE COCCI MODERATE GRAM POSITIVE RODS    Culture   Final    MODERATE PROTEUS MIRABILIS SUSCEPTIBILITIES TO FOLLOW HOLDING FOR POSSIBLE ANAEROBE Performed at [facility], [place] [address] [place], [place] [address]    Report Status PENDING  Incomplete     Medications:   . baclofen  20 mg Oral TID  . bisacodyl  10 mg Rectal Daily  . enoxaparin (LOVENOX) injection  40 mg Subcutaneous Q24H  . gabapentin  800 mg Oral TID  . lactose free nutrition  237 mL Oral TID WC  . lamoTRIgine  125 mg Oral Daily  . lubiprostone  24 mcg Oral Q breakfast  . multivitamin  15 mL Oral Daily  . pantoprazole  20 mg Oral Daily  . senna-docusate  1 tablet Oral BID  . sertraline  100 mg Oral Daily   Continuous Infusions: . 0.9 % NaCl with KCl 20 mEq / L 100 mL/hr at 02/21/17 0406  . DAPTOmycin (CUBICIN)  IV Stopped (02/21/17 2005)  . meropenem (MERREM) IV Stopped (02/22/17 0129)       LOS: 6 days   Charlynne Cousins  [facility] Pager [phone]  *Please refer to [email or web address], password [email or web address] to get updated schedule on who will round on this patient, as hospitalists switch teams weekly. If 7PM-7AM, please contact night-coverage at [email or web address], password [email or web address] for any overnight needs.  02/22/2017, 8:31 AM

## 2017-02-23 LAB — AEROBIC/ANAEROBIC CULTURE (SURGICAL/DEEP WOUND)

## 2017-02-23 LAB — AEROBIC/ANAEROBIC CULTURE W GRAM STAIN (SURGICAL/DEEP WOUND)

## 2017-02-23 MED ORDER — DIPHENHYDRAMINE HCL 25 MG PO CAPS
25.0000 mg | ORAL_CAPSULE | Freq: Four times a day (QID) | ORAL | Status: DC | PRN
  Administered 2017-02-23 – 2017-03-12 (×15): 25 mg via ORAL
  Filled 2017-02-23 (×15): qty 1

## 2017-02-23 NOTE — Progress Notes (Signed)
[facility] PROGRESS NOTE    Progress Note  [name]  QMV:[id number] DOB: 01/25/1981 DOA: 02/13/2017 PCP: Lennie Odor, PA-C     Brief Narrative:   [name] is an 36 y.o. 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 Gen. surgery and ID was consulted, she is status post debridement on 02/18/2017. ID on board, wound culture grew Acinetobacter sensitivities pending and Proteus pansensitive Place PICC line could not be placed, we have consulted IR to see if they if they can place a line today. Patient refused a peripheral line on 02/22/2017 so she did not receive her IV antibiotics.   Multiple sclerosis and paraplegia: Wheelchair-bound continue supportive care.  Constipation: Continue bowel regimen.  Hypokalemia: Resolved.  Chronic microcytic anemia: Only to follow-up with primary care doctor as an outpatient. Hemoglobin has remained stable.  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   DVT prophylaxis: lovenox Family Communication:none Disposition Plan/Barrier to D/C: home in 3-4 days Code Status:     Code Status Orders  (From admission, onward)        Start     Ordered   02/14/17 0101  Full code  Continuous     02/14/17 0100    Code Status History    Date Active Date Inactive Code Status Order ID Comments User Context   11/15/2016 02:45 11/17/2016 18:48 Full Code [id number]  [name], MD Inpatient   07/17/2016 23:21 07/19/2016 20:47 Full Code [id number]  Ivor Costa, MD ED   02/15/2016 22:17 02/25/2016 20:01 Full Code [id number]  [name], MD Inpatient   01/07/2016 09:58 01/09/2016 14:24 Full Code [id number]  [name], MD Inpatient   10/29/2014 21:14 10/31/2014 17:11 Full Code  [id number]  [name], MD Inpatient   09/27/2014 22:49 09/28/2014 20:46 Full Code [id number]  [name], DO ED   03/30/2013 12:15 04/02/2013 17:32 Full Code [id number]  [name], MD Inpatient   02/16/2012 01:46 02/18/2012 18:53 Full Code [id number]  [name]., DO ED        IV Access:    Peripheral IV   Procedures and diagnostic studies:   No results found.   Medical Consultants:    None.  Anti-Infectives:   Continue IV Meropenem and daptomycin along with Flagyl.  Subjective:    [name] no new complaints.  Objective:    Vitals:   02/21/17 2058 02/22/17 0403 02/22/17 2229 02/23/17 0611  BP: (!) 108/55 (!) 100/53 (!) 90/59 (!) 92/52  Pulse: 97 91 99 81  Resp: 18 18 18 18   Temp: 98.9 F (37.2 C) 98.1 F (36.7 C) 98.8 F (37.1 C) 98.4 F (36.9 C)  TempSrc: Oral Oral Oral Oral  SpO2: 100% 100% 100% 100%  Weight:      Height:        Intake/Output Summary (Last 24 hours) at 02/23/2017 0800 Last data filed at 02/23/2017 0612 Gross per 24 hour  Intake 920 ml  Output 2376 ml  Net -1456 ml   Filed Weights   02/14/17 0057  Weight: 59.7 kg (131 lb 9.8 oz)    Exam: General exam: In no acute distress. Respiratory system: Good air movement and clear to auscultation. Cardiovascular system: S1 & S2 heard, RRR. No JVD, murmurs, rubs, gallops or clicks.  Gastrointestinal system: Abdomen  is nondistended, soft and nontender.  Extremities: No pedal edema. Psychiatry: Judgement and insight appear normal. Mood & affect appropriate.    Data Reviewed:    Labs: Basic Metabolic Panel: Recent Labs  Lab 02/19/17 0512  NA 141  K 4.1  CL 104  CO2 28  GLUCOSE 92  BUN 10  CREATININE <0.30*  CALCIUM 9.0   GFR CrCl cannot be calculated (This lab value cannot be used to calculate CrCl because it is not a number: <0.30). Liver Function Tests: No results for input(s): AST, ALT, ALKPHOS, BILITOT, PROT, ALBUMIN in the last 168  hours. No results for input(s): LIPASE, AMYLASE in the last 168 hours. No results for input(s): AMMONIA in the last 168 hours. Coagulation profile No results for input(s): INR, PROTIME in the last 168 hours.  CBC: Recent Labs  Lab 02/19/17 0512 02/20/17 0557 02/22/17 0353  WBC 5.7 4.3 5.8  HGB 8.3* 7.7* 8.0*  HCT 29.2* 26.8* 27.1*  MCV 73.4* 74.0* 72.5*  PLT 208 196 242   Cardiac Enzymes: Recent Labs  Lab 02/18/17 1630  CKTOTAL 21*   BNP (last 3 results) No results for input(s): PROBNP in the last 8760 hours. CBG: No results for input(s): GLUCAP in the last 168 hours. D-Dimer: No results for input(s): DDIMER in the last 72 hours. Hgb A1c: No results for input(s): HGBA1C in the last 72 hours. Lipid Profile: No results for input(s): CHOL, HDL, LDLCALC, TRIG, CHOLHDL, LDLDIRECT in the last 72 hours. Thyroid function studies: No results for input(s): TSH, T4TOTAL, T3FREE, THYROIDAB in the last 72 hours.  Invalid input(s): FREET3 Anemia work up: No results for input(s): VITAMINB12, FOLATE, FERRITIN, TIBC, IRON, RETICCTPCT in the last 72 hours. Sepsis Labs: Recent Labs  Lab 02/19/17 0512 02/20/17 0557 02/22/17 0353  WBC 5.7 4.3 5.8   Microbiology Recent Results (from the past 240 hour(s))  Wet prep, genital     Status: Abnormal   Collection Time: 02/13/17  6:38 PM  Result Value Ref Range Status   Yeast Wet Prep HPF POC NONE SEEN NONE SEEN Final   Trich, Wet Prep NONE SEEN NONE SEEN Final   Clue Cells Wet Prep HPF POC NONE SEEN NONE SEEN Final   WBC, Wet Prep HPF POC MODERATE (A) NONE SEEN Final   Sperm NONE SEEN  Final  MRSA PCR Screening     Status: None   Collection Time: 02/14/17 11:20 AM  Result Value Ref Range Status   MRSA by PCR NEGATIVE NEGATIVE Final    Comment:        The GeneXpert MRSA Assay (FDA approved for NASAL specimens only), is one component of a comprehensive MRSA colonization surveillance program. It is not intended to diagnose  MRSA infection nor to guide or monitor treatment for MRSA infections.   Aerobic/Anaerobic Culture (surgical/deep wound)     Status: None (Preliminary result)   Collection Time: 02/18/17 12:02 PM  Result Value Ref Range Status   Specimen Description TISSUE SOFT LT SACRAL ULCER  Final   Special Requests TISSUE SENT INSIDE SWABS  Final   Gram Stain   Final    FEW WBC PRESENT,BOTH PMN AND MONONUCLEAR ABUNDANT GRAM POSITIVE COCCI ABUNDANT GRAM NEGATIVE RODS ABUNDANT GRAM POSITIVE RODS    Culture   Final    ABUNDANT PROTEUS MIRABILIS ABUNDANT ACINETOBACTER CALCOACETICUS/BAUMANNII COMPLEX MODERATE STAPHYLOCOCCUS AUREUS HOLDING FOR POSSIBLE ANAEROBE    Report Status PENDING  Incomplete   Organism ID, Bacteria PROTEUS MIRABILIS  Final  Susceptibility   Proteus mirabilis - MIC*    AMPICILLIN <=2 SENSITIVE Sensitive     CEFAZOLIN 8 SENSITIVE Sensitive     CEFEPIME <=1 SENSITIVE Sensitive     CEFTAZIDIME <=1 SENSITIVE Sensitive     CEFTRIAXONE <=1 SENSITIVE Sensitive     CIPROFLOXACIN <=0.25 SENSITIVE Sensitive     GENTAMICIN <=1 SENSITIVE Sensitive     IMIPENEM 1 SENSITIVE Sensitive     TRIMETH/SULFA <=20 SENSITIVE Sensitive     AMPICILLIN/SULBACTAM <=2 SENSITIVE Sensitive     PIP/TAZO Value in next row Sensitive      <=4 SENSITIVEPerformed at [facility] [address] [place], [place] [address]    * ABUNDANT PROTEUS MIRABILIS  Aerobic/Anaerobic Culture (surgical/deep wound)     Status: None (Preliminary result)   Collection Time: 02/18/17 12:14 PM  Result Value Ref Range Status   Specimen Description FLUID SACRAL ULCER  Final   Special Requests NONE  Final   Gram Stain   Final    RARE WBC PRESENT,BOTH PMN AND MONONUCLEAR MODERATE GRAM POSITIVE COCCI MODERATE GRAM POSITIVE RODS    Culture   Final    MODERATE PROTEUS MIRABILIS FEW STAPHYLOCOCCUS AUREUS SUSCEPTIBILITIES TO FOLLOW HOLDING FOR POSSIBLE ANAEROBE Performed at [facility], [place] [address], [place], [place] [address]    Report Status PENDING  Incomplete   Organism ID, Bacteria PROTEUS MIRABILIS  Final      Susceptibility   Proteus mirabilis - MIC*    AMPICILLIN <=2 SENSITIVE Sensitive     CEFAZOLIN <=4 SENSITIVE Sensitive     CEFEPIME <=1 SENSITIVE Sensitive     CEFTAZIDIME <=1 SENSITIVE Sensitive     CEFTRIAXONE <=1 SENSITIVE Sensitive     CIPROFLOXACIN <=0.25 SENSITIVE Sensitive     GENTAMICIN <=1 SENSITIVE Sensitive     IMIPENEM 2 SENSITIVE Sensitive     TRIMETH/SULFA <=20 SENSITIVE Sensitive     AMPICILLIN/SULBACTAM <=2 SENSITIVE Sensitive     PIP/TAZO <=4 SENSITIVE Sensitive     * MODERATE PROTEUS MIRABILIS     Medications:   . baclofen  20 mg Oral TID  . bisacodyl  10 mg Rectal Daily  . enoxaparin (LOVENOX) injection  40 mg Subcutaneous Q24H  . gabapentin  800 mg Oral TID  . lactose free nutrition  237 mL Oral TID WC  . lamoTRIgine  125 mg Oral Daily  . lubiprostone  24 mcg Oral Q breakfast  . multivitamin  15 mL Oral Daily  . pantoprazole  20 mg Oral Daily  . senna-docusate  1 tablet Oral BID  . sertraline  100 mg Oral Daily   Continuous Infusions: . 0.9 % NaCl with KCl 20 mEq / L Stopped (02/23/17 0050)  . DAPTOmycin (CUBICIN)  IV 360 mg (02/22/17 1825)  . meropenem (MERREM) IV Stopped (02/22/17 2355)     LOS: 7 days   Charlynne Cousins  [facility] Pager [phone]  *Please refer to [email or web address], password [email or web address] to get updated schedule on who will round on this patient, as hospitalists switch teams weekly. If 7PM-7AM, please contact night-coverage at [email or web address], password [email or web address] for any overnight needs.  02/23/2017, 8:00 AM

## 2017-02-23 NOTE — Progress Notes (Signed)
Pharmacy Antibiotic Note  [name] is a 36 y.o. female admitted on 02/13/2017 with decubitus ulcer and early pelvic osteomyelitis s/p I&D.  Tissue from sacral ulcer culture growing Proteus mirabilis and Acinetobacer complex.  Ceftaz/Flagyl switched to meropenem and continuing daptomycin.  Pharmacy has been consulted for meropenem dosing now in addition to daptomycin.    Plan: Meropenem 1g IV q8h. Continue daptomycin 360mg  IV q24h (6mg /kg) CK weekly while on daptomycin Follow renal function and cultures  Height: 5\' 3"  (160 cm) Weight: 131 lb 9.8 oz (59.7 kg) IBW/kg (Calculated) : 52.4  Temp (24hrs), Avg:98.6 F (37 C), Min:98.4 F (36.9 C), Max:98.8 F (37.1 C)  Recent Labs  Lab 02/19/17 0512 02/20/17 0557 02/22/17 0353  WBC 5.7 4.3 5.8  CREATININE <0.30*  --   --     CrCl cannot be calculated (This lab value cannot be used to calculate CrCl because it is not a number: <0.30).    Allergies  Allergen Reactions  . Iodine Anaphylaxis  . Latex Anaphylaxis  . Other Anaphylaxis  . Shellfish-Derived Products Anaphylaxis    Reaction unknown  . Vancomycin Anaphylaxis  . Aspirin Other (See Comments)    Reaction unknown  . Strawberry Extract Hives and Rash    Antimicrobials this admission:  11/19 Ceftazidime >> 11/21 11/19 Daptomycin >> 11/21 Meropenem >>  Dose adjustments this admission:   Microbiology results:  11/19 Sacral ulcer (soft tissue): Proteus mirabilis, Acinetobacter calcoaceticus/baumannii complex, pansensitive 11/19 Sacral ulcer (fluid): Proteus mirabilis, pansensitive 11/15 MRSA PCR: negative 11/14 Wet prep: moderate WBC  Thank you for allowing pharmacy to be a part of this patient's care.  Dolly Rias RPh 02/23/2017, 10:03 AM Pager [phone]

## 2017-02-24 NOTE — Progress Notes (Signed)
[facility] PROGRESS NOTE    Progress Note  [name]  VFI:[id number] DOB: July 25, 1980 DOA: 02/13/2017 PCP: Lennie Odor, PA-C     Brief Narrative:   [name] is an 36 y.o. 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 Gen. surgery and ID was consulted, she is status post debridement on 02/18/2017. ID on board, wound culture grew Acinetobacter sensitivities pending and Proteus pansensitive and MRSA PICC line cannot be placed until 02/25/2017. Patient refused a peripheral line on 02/22/2017 so she did not receive her IV antibiotics. Peripheral IV line has been placed and she did get her antibiotics on 02/23/2017.  Multiple sclerosis and paraplegia: Wheelchair-bound continue supportive care.  Constipation: Continue bowel regimen.  Hypokalemia: Resolved.  Chronic microcytic anemia: Only to follow-up with primary care doctor as an outpatient. Hemoglobin has remained stable.  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   DVT prophylaxis: lovenox Family Communication:none Disposition Plan/Barrier to D/C: home in 1 day Code Status:     Code Status Orders  (From admission, onward)        Start     Ordered   02/14/17 0101  Full code  Continuous     02/14/17 0100    Code Status History    Date Active Date Inactive Code Status Order ID Comments User Context   11/15/2016 02:45 11/17/2016 18:48 Full Code [id number]  [name], MD Inpatient   07/17/2016 23:21 07/19/2016 20:47 Full Code [id number]  Ivor Costa, MD ED   02/15/2016 22:17 02/25/2016 20:01 Full Code [id number]  [name], MD Inpatient   01/07/2016 09:58 01/09/2016 14:24 Full Code [id number]  [name], MD Inpatient   10/29/2014 21:14  10/31/2014 17:11 Full Code [id number]  [name], MD Inpatient   09/27/2014 22:49 09/28/2014 20:46 Full Code [id number]  [name], DO ED   03/30/2013 12:15 04/02/2013 17:32 Full Code [id number]  [name], MD Inpatient   02/16/2012 01:46 02/18/2012 18:53 Full Code [id number]  [name]., DO ED        IV Access:    Peripheral IV   Procedures and diagnostic studies:   No results found.   Medical Consultants:    None.  Anti-Infectives:   Continue IV Meropenem and daptomycin along with Flagyl.  Subjective:    Makalyn E [name] no new complaints.  Objective:    Vitals:   02/23/17 0611 02/23/17 1348 02/23/17 2333 02/24/17 0630  BP: (!) 92/52 100/62 (!) 93/55 (!) 99/59  Pulse: 81 97 91 93  Resp: 18 18 18 18   Temp: 98.4 F (36.9 C) 98.4 F (36.9 C) 98.9 F (37.2 C) 98.9 F (37.2 C)  TempSrc: Oral Oral Oral Oral  SpO2: 100% 99% 98%   Weight:      Height:        Intake/Output Summary (Last 24 hours) at 02/24/2017 0750 Last data filed at 02/24/2017 0717 Gross per 24 hour  Intake 1718.7 ml  Output 1350 ml  Net 368.7 ml   Filed Weights   02/14/17 0057  Weight: 59.7 kg (131 lb 9.8 oz)    Exam: General exam: In no acute distress. Respiratory system: Good air movement and clear to auscultation. Cardiovascular system: S1 & S2 heard, RRR. No JVD, murmurs, rubs, gallops or clicks.  Gastrointestinal system: Abdomen is  nondistended, soft and nontender.  Extremities: No pedal edema. Psychiatry: Judgement and insight appear normal. Mood & affect appropriate.    Data Reviewed:    Labs: Basic Metabolic Panel: Recent Labs  Lab 02/19/17 0512  NA 141  K 4.1  CL 104  CO2 28  GLUCOSE 92  BUN 10  CREATININE <0.30*  CALCIUM 9.0   GFR CrCl cannot be calculated (This lab value cannot be used to calculate CrCl because it is not a number: <0.30). Liver Function Tests: No results for input(s): AST, ALT, ALKPHOS, BILITOT, PROT, ALBUMIN in  the last 168 hours. No results for input(s): LIPASE, AMYLASE in the last 168 hours. No results for input(s): AMMONIA in the last 168 hours. Coagulation profile No results for input(s): INR, PROTIME in the last 168 hours.  CBC: Recent Labs  Lab 02/19/17 0512 02/20/17 0557 02/22/17 0353  WBC 5.7 4.3 5.8  HGB 8.3* 7.7* 8.0*  HCT 29.2* 26.8* 27.1*  MCV 73.4* 74.0* 72.5*  PLT 208 196 242   Cardiac Enzymes: Recent Labs  Lab 02/18/17 1630  CKTOTAL 21*   BNP (last 3 results) No results for input(s): PROBNP in the last 8760 hours. CBG: No results for input(s): GLUCAP in the last 168 hours. D-Dimer: No results for input(s): DDIMER in the last 72 hours. Hgb A1c: No results for input(s): HGBA1C in the last 72 hours. Lipid Profile: No results for input(s): CHOL, HDL, LDLCALC, TRIG, CHOLHDL, LDLDIRECT in the last 72 hours. Thyroid function studies: No results for input(s): TSH, T4TOTAL, T3FREE, THYROIDAB in the last 72 hours.  Invalid input(s): FREET3 Anemia work up: No results for input(s): VITAMINB12, FOLATE, FERRITIN, TIBC, IRON, RETICCTPCT in the last 72 hours. Sepsis Labs: Recent Labs  Lab 02/19/17 0512 02/20/17 0557 02/22/17 0353  WBC 5.7 4.3 5.8   Microbiology Recent Results (from the past 240 hour(s))  MRSA PCR Screening     Status: None   Collection Time: 02/14/17 11:20 AM  Result Value Ref Range Status   MRSA by PCR NEGATIVE NEGATIVE Final    Comment:        The GeneXpert MRSA Assay (FDA approved for NASAL specimens only), is one component of a comprehensive MRSA colonization surveillance program. It is not intended to diagnose MRSA infection nor to guide or monitor treatment for MRSA infections.   Aerobic/Anaerobic Culture (surgical/deep wound)     Status: None (Preliminary result)   Collection Time: 02/18/17 12:02 PM  Result Value Ref Range Status   Specimen Description TISSUE SOFT LT SACRAL ULCER  Final   Special Requests TISSUE SENT INSIDE SWABS   Final   Gram Stain   Final    FEW WBC PRESENT,BOTH PMN AND MONONUCLEAR ABUNDANT GRAM POSITIVE COCCI ABUNDANT GRAM NEGATIVE RODS ABUNDANT GRAM POSITIVE RODS Performed at [facility], [place] [address], [place], [place] [address]    Culture   Final    ABUNDANT PROTEUS MIRABILIS ABUNDANT ACINETOBACTER CALCOACETICUS/BAUMANNII COMPLEX REPEATING SENSITIVITIES MODERATE METHICILLIN RESISTANT STAPHYLOCOCCUS AUREUS MIXED ANAEROBIC FLORA PRESENT.  CALL LAB IF FURTHER IID REQUIRED.    Report Status PENDING  Incomplete   Organism ID, Bacteria PROTEUS MIRABILIS  Final   Organism ID, Bacteria METHICILLIN RESISTANT STAPHYLOCOCCUS AUREUS  Final      Susceptibility   Methicillin resistant staphylococcus aureus - MIC*    CIPROFLOXACIN >=8 RESISTANT Resistant     ERYTHROMYCIN <=0.25 SENSITIVE Sensitive     GENTAMICIN <=0.5 SENSITIVE Sensitive     OXACILLIN >=4 RESISTANT Resistant  TETRACYCLINE <=1 SENSITIVE Sensitive     VANCOMYCIN 1 SENSITIVE Sensitive     TRIMETH/SULFA <=10 SENSITIVE Sensitive     CLINDAMYCIN <=0.25 SENSITIVE Sensitive     RIFAMPIN <=0.5 SENSITIVE Sensitive     Inducible Clindamycin NEGATIVE Sensitive     * MODERATE METHICILLIN RESISTANT STAPHYLOCOCCUS AUREUS   Proteus mirabilis - MIC*    AMPICILLIN <=2 SENSITIVE Sensitive     CEFAZOLIN 8 SENSITIVE Sensitive     CEFEPIME <=1 SENSITIVE Sensitive     CEFTAZIDIME <=1 SENSITIVE Sensitive     CEFTRIAXONE <=1 SENSITIVE Sensitive     CIPROFLOXACIN <=0.25 SENSITIVE Sensitive     GENTAMICIN <=1 SENSITIVE Sensitive     IMIPENEM 1 SENSITIVE Sensitive     TRIMETH/SULFA <=20 SENSITIVE Sensitive     AMPICILLIN/SULBACTAM <=2 SENSITIVE Sensitive     PIP/TAZO <=4 SENSITIVE Sensitive     * ABUNDANT PROTEUS MIRABILIS  Aerobic/Anaerobic Culture (surgical/deep wound)     Status: None   Collection Time: 02/18/17 12:14 PM  Result Value Ref Range Status   Specimen Description FLUID SACRAL ULCER  Final   Special Requests NONE  Final    Gram Stain   Final    RARE WBC PRESENT,BOTH PMN AND MONONUCLEAR MODERATE GRAM POSITIVE COCCI MODERATE GRAM POSITIVE RODS Performed at [facility], [address], [place], [place] [address]    Culture   Final    MODERATE PROTEUS MIRABILIS FEW METHICILLIN RESISTANT STAPHYLOCOCCUS AUREUS MIXED ANAEROBIC FLORA PRESENT.  CALL LAB IF FURTHER IID REQUIRED.    Report Status 02/23/2017 FINAL  Final   Organism ID, Bacteria PROTEUS MIRABILIS  Final   Organism ID, Bacteria METHICILLIN RESISTANT STAPHYLOCOCCUS AUREUS  Final      Susceptibility   Methicillin resistant staphylococcus aureus - MIC*    CIPROFLOXACIN >=8 RESISTANT Resistant     ERYTHROMYCIN 0.5 SENSITIVE Sensitive     GENTAMICIN <=0.5 SENSITIVE Sensitive     OXACILLIN >=4 RESISTANT Resistant     TETRACYCLINE <=1 SENSITIVE Sensitive     VANCOMYCIN 1 SENSITIVE Sensitive     TRIMETH/SULFA <=10 SENSITIVE Sensitive     CLINDAMYCIN <=0.25 SENSITIVE Sensitive     RIFAMPIN <=0.5 SENSITIVE Sensitive     Inducible Clindamycin NEGATIVE Sensitive     * FEW METHICILLIN RESISTANT STAPHYLOCOCCUS AUREUS   Proteus mirabilis - MIC*    AMPICILLIN <=2 SENSITIVE Sensitive     CEFAZOLIN <=4 SENSITIVE Sensitive     CEFEPIME <=1 SENSITIVE Sensitive     CEFTAZIDIME <=1 SENSITIVE Sensitive     CEFTRIAXONE <=1 SENSITIVE Sensitive     CIPROFLOXACIN <=0.25 SENSITIVE Sensitive     GENTAMICIN <=1 SENSITIVE Sensitive     IMIPENEM 2 SENSITIVE Sensitive     TRIMETH/SULFA <=20 SENSITIVE Sensitive     AMPICILLIN/SULBACTAM <=2 SENSITIVE Sensitive     PIP/TAZO <=4 SENSITIVE Sensitive     * MODERATE PROTEUS MIRABILIS     Medications:   . baclofen  20 mg Oral TID  . bisacodyl  10 mg Rectal Daily  . enoxaparin (LOVENOX) injection  40 mg Subcutaneous Q24H  . gabapentin  800 mg Oral TID  . lactose free nutrition  237 mL Oral TID WC  . lamoTRIgine  125 mg Oral Daily  . lubiprostone  24 mcg Oral Q breakfast  . multivitamin  15 mL Oral Daily  .  pantoprazole  20 mg Oral Daily  . senna-docusate  1 tablet Oral BID  . sertraline  100 mg Oral Daily   Continuous Infusions: .  0.9 % NaCl with KCl 20 mEq / L 10 mL/hr at 02/23/17 0924  . DAPTOmycin (CUBICIN)  IV Stopped (02/23/17 1812)  . meropenem (MERREM) IV 1 g (02/24/17 0717)     LOS: 8 days   Charlynne Cousins  [facility] Pager [phone]  *Please refer to [email or web address], password [email or web address] to get updated schedule on who will round on this patient, as hospitalists switch teams weekly. If 7PM-7AM, please contact night-coverage at [email or web address], password [email or web address] for any overnight needs.  02/24/2017, 7:50 AM

## 2017-02-25 ENCOUNTER — Ambulatory Visit: Payer: Medicaid Other | Admitting: Obstetrics & Gynecology

## 2017-02-25 ENCOUNTER — Encounter (HOSPITAL_COMMUNITY): Payer: Self-pay | Admitting: Interventional Radiology

## 2017-02-25 ENCOUNTER — Inpatient Hospital Stay (HOSPITAL_COMMUNITY): Payer: Medicaid Other

## 2017-02-25 DIAGNOSIS — T7840XA Allergy, unspecified, initial encounter: Secondary | ICD-10-CM

## 2017-02-25 DIAGNOSIS — L89323 Pressure ulcer of left buttock, stage 3: Principal | ICD-10-CM

## 2017-02-25 HISTORY — PX: IR FLUORO GUIDE CV LINE LEFT: IMG2282

## 2017-02-25 HISTORY — PX: IR US GUIDE VASC ACCESS LEFT: IMG2389

## 2017-02-25 LAB — CK: CK TOTAL: 15 U/L — AB (ref 38–234)

## 2017-02-25 MED ORDER — POLYMYXIN B SULFATE 500000 UNITS IJ SOLR: 1.5000 mg/kg | Freq: Two times a day (BID) | INTRAMUSCULAR | Status: DC

## 2017-02-25 MED ORDER — MINOCYCLINE HCL 100 MG PO CAPS
100.0000 mg | ORAL_CAPSULE | Freq: Two times a day (BID) | ORAL | Status: DC
  Administered 2017-02-25 – 2017-02-27 (×4): 100 mg via ORAL
  Filled 2017-02-25 (×5): qty 1

## 2017-02-25 MED ORDER — SODIUM CHLORIDE 0.9% FLUSH
10.0000 mL | INTRAVENOUS | Status: DC | PRN
  Administered 2017-02-26 – 2017-03-12 (×7): 10 mL
  Administered 2017-03-12: 20 mL
  Filled 2017-02-25 (×8): qty 40

## 2017-02-25 MED ORDER — LEVOFLOXACIN IN D5W 750 MG/150ML IV SOLN
750.0000 mg | INTRAVENOUS | Status: DC
  Administered 2017-02-25: 750 mg via INTRAVENOUS
  Filled 2017-02-25 (×2): qty 150

## 2017-02-25 MED ORDER — POLYMYXIN B SULFATE 500000 UNITS IJ SOLR
2.5000 mg/kg | Freq: Once | INTRAVENOUS | Status: DC
  Administered 2017-02-25: 149.25 mg via INTRAVENOUS
  Filled 2017-02-25 (×2): qty 1492500

## 2017-02-25 MED ORDER — LIDOCAINE HCL 1 % IJ SOLN
INTRAMUSCULAR | Status: AC
  Filled 2017-02-25: qty 20

## 2017-02-25 MED ORDER — ENOXAPARIN SODIUM 40 MG/0.4ML ~~LOC~~ SOLN
40.0000 mg | SUBCUTANEOUS | Status: DC
  Administered 2017-02-25 – 2017-03-11 (×15): 40 mg via SUBCUTANEOUS
  Filled 2017-02-25 (×16): qty 0.4

## 2017-02-25 MED ORDER — LIDOCAINE-EPINEPHRINE (PF) 2 %-1:200000 IJ SOLN
INTRAMUSCULAR | Status: AC | PRN
  Administered 2017-02-25: 5 mL via INTRADERMAL

## 2017-02-25 MED ORDER — DEXTROSE 5 % IV SOLN
2.0000 g | Freq: Three times a day (TID) | INTRAVENOUS | Status: DC
  Administered 2017-02-25: 2 g via INTRAVENOUS
  Filled 2017-02-25: qty 2

## 2017-02-25 NOTE — Procedures (Signed)
Pre procedural Diagnosis: Poor venous access Post Procedural Diagnosis: Same  Successful placement of left brachial vein approach 34 cm dual lumen PICC line with tip at the superior caval-atrial junction.    EBL: None  No immediate post procedural complication.  The PICC line is ready for immediate use.  Ronny Bacon, MD Pager #: [phone]

## 2017-02-25 NOTE — Progress Notes (Signed)
Subjective:  No new complaints initially but then developed "whelps", hives on arm where polymyxin ws infusing (this did follow me talking to her and mom about her past allergies and questions by Mom re if pt could have an allergic rxn to this drug   Antibiotics:  Anti-infectives (From admission, onward)   Start     Dose/Rate Route Frequency Ordered Stop   02/25/17 2330  polymyxin B 89.55 mg in dextrose 5 % 500 mL IVPB  Status:  Discontinued     1.5 mg/kg  59.7 kg 500 mL/hr over 1 Hours Intravenous Every 12 hours 02/25/17 1127 02/25/17 1442   02/25/17 1800  levofloxacin (LEVAQUIN) IVPB 750 mg     750 mg 100 mL/hr over 90 Minutes Intravenous Every 24 hours 02/25/17 1122     02/25/17 1600  minocycline (MINOCIN,DYNACIN) capsule 100 mg     100 mg Oral 2 times daily 02/25/17 1442     02/25/17 1230  polymyxin B 149.25 mg in dextrose 5 % 500 mL IVPB  Status:  Discontinued     2.5 mg/kg  59.7 kg 250 mL/hr over 2 Hours Intravenous  Once 02/25/17 1127 02/25/17 1442   02/25/17 1000  cefTAZidime (FORTAZ) 2 g in dextrose 5 % 50 mL IVPB  Status:  Discontinued     2 g 100 mL/hr over 30 Minutes Intravenous Every 8 hours 02/25/17 0942 02/25/17 1122   02/20/17 1600  meropenem (MERREM) 1 g in sodium chloride 0.9 % 100 mL IVPB  Status:  Discontinued     1 g 200 mL/hr over 30 Minutes Intravenous Every 8 hours 02/20/17 1456 02/25/17 0942   02/19/17 2200  metroNIDAZOLE (FLAGYL) tablet 500 mg  Status:  Discontinued     500 mg Oral Every 8 hours 02/19/17 2043 02/20/17 1430   02/18/17 1800  cefTAZidime (FORTAZ) 2 g in dextrose 5 % 50 mL IVPB  Status:  Discontinued     2 g 100 mL/hr over 30 Minutes Intravenous Every 8 hours 02/18/17 1631 02/20/17 1454   02/18/17 1800  DAPTOmycin (CUBICIN) 360 mg in sodium chloride 0.9 % IVPB     360 mg 214.4 mL/hr over 30 Minutes Intravenous Every 24 hours 02/18/17 1633        Medications: Scheduled Meds: . baclofen  20 mg Oral TID  . bisacodyl  10 mg  Rectal Daily  . enoxaparin (LOVENOX) injection  40 mg Subcutaneous Q24H  . gabapentin  800 mg Oral TID  . lactose free nutrition  237 mL Oral TID WC  . lamoTRIgine  125 mg Oral Daily  . lidocaine      . lubiprostone  24 mcg Oral Q breakfast  . minocycline  100 mg Oral BID  . multivitamin  15 mL Oral Daily  . pantoprazole  20 mg Oral Daily  . senna-docusate  1 tablet Oral BID  . sertraline  100 mg Oral Daily   Continuous Infusions: . 0.9 % NaCl with KCl 20 mEq / L 10 mL/hr at 02/23/17 0924  . DAPTOmycin (CUBICIN)  IV Stopped (02/24/17 1832)  . levofloxacin (LEVAQUIN) IV     PRN Meds:.diazepam, diphenhydrAMINE, fluticasone, ibuprofen, ondansetron (ZOFRAN) IV    Objective: Weight change:   Intake/Output Summary (Last 24 hours) at 02/25/2017 1530 Last data filed at 02/25/2017 0500 Gross per 24 hour  Intake 747 ml  Output 1000 ml  Net -253 ml   Blood pressure (!) 96/52, pulse 89, temperature 99.1 F (37.3 C),  temperature source Oral, resp. rate 16, height 5' 3"  (1.6 m), weight 131 lb 9.8 oz (59.7 kg), SpO2 100 %. Temp:  [99.1 F (37.3 C)-99.2 F (37.3 C)] 99.1 F (37.3 C) (11/26 0605) Pulse Rate:  [89-93] 89 (11/26 0605) Resp:  [16] 16 (11/26 0605) BP: (96-114)/(52-64) 96/52 (11/26 0605) SpO2:  [100 %] 100 % (11/26 2263)  Physical Exam: General: Alert and awake, oriented x3, not in any acute distress. CV: rr Pulm; no wheezes, resp distress Neuro: no new focal deficits  Skin" hives on arm where polymyxin infused  02/25/17:      CBC:  CBC Latest Ref Rng & Units 02/22/2017 02/20/2017 02/19/2017  WBC 4.0 - 10.5 K/uL 5.8 4.3 5.7  Hemoglobin 12.0 - 15.0 g/dL 8.0(L) 7.7(L) 8.3(L)  Hematocrit 36.0 - 46.0 % 27.1(L) 26.8(L) 29.2(L)  Platelets 150 - 400 K/uL 242 196 208     BMET No results for input(s): NA, K, CL, CO2, GLUCOSE, BUN, CREATININE, CALCIUM in the last 72 hours.   Liver Panel  No results for input(s): PROT, ALBUMIN, AST, ALT, ALKPHOS, BILITOT,  BILIDIR, IBILI in the last 72 hours.     Sedimentation Rate No results for input(s): ESRSEDRATE in the last 72 hours. C-Reactive Protein No results for input(s): CRP in the last 72 hours.  Micro Results: Recent Results (from the past 720 hour(s))  Wet prep, genital     Status: Abnormal   Collection Time: 02/13/17  6:38 PM  Result Value Ref Range Status   Yeast Wet Prep HPF POC NONE SEEN NONE SEEN Final   Trich, Wet Prep NONE SEEN NONE SEEN Final   Clue Cells Wet Prep HPF POC NONE SEEN NONE SEEN Final   WBC, Wet Prep HPF POC MODERATE (A) NONE SEEN Final   Sperm NONE SEEN  Final  MRSA PCR Screening     Status: None   Collection Time: 02/14/17 11:20 AM  Result Value Ref Range Status   MRSA by PCR NEGATIVE NEGATIVE Final    Comment:        The GeneXpert MRSA Assay (FDA approved for NASAL specimens only), is one component of a comprehensive MRSA colonization surveillance program. It is not intended to diagnose MRSA infection nor to guide or monitor treatment for MRSA infections.   Aerobic/Anaerobic Culture (surgical/deep wound)     Status: None (Preliminary result)   Collection Time: 02/18/17 12:02 PM  Result Value Ref Range Status   Specimen Description TISSUE SOFT LT SACRAL ULCER  Final   Special Requests TISSUE SENT INSIDE SWABS  Final   Gram Stain   Final    FEW WBC PRESENT,BOTH PMN AND MONONUCLEAR ABUNDANT GRAM POSITIVE COCCI ABUNDANT GRAM NEGATIVE RODS ABUNDANT GRAM POSITIVE RODS    Culture   Final    ABUNDANT PROTEUS MIRABILIS ABUNDANT ACINETOBACTER CALCOACETICUS/BAUMANNII COMPLEX MULTI-DRUG RESISTANT ORGANISM MODERATE METHICILLIN RESISTANT STAPHYLOCOCCUS AUREUS MIXED ANAEROBIC FLORA PRESENT.  CALL LAB IF FURTHER IID REQUIRED. RESULT CALLED TO, READ BACK BY AND VERIFIED WITH: K BLUM,RN AT 1142 02/24/17 BY L BENFIELD CONCERNING RESISTANT ACINETOBACTER WILL REFER ISOLATE TO [facility] AND [facility] FOR ADDITIONAL SENSITIVITIES Performed at [facility],  [place] [address], [place], [place] [address]    Report Status PENDING  Incomplete   Organism ID, Bacteria PROTEUS MIRABILIS  Final   Organism ID, Bacteria METHICILLIN RESISTANT STAPHYLOCOCCUS AUREUS  Final   Organism ID, Bacteria ACINETOBACTER CALCOACETICUS/BAUMANNII COMPLEX  Final      Susceptibility   Acinetobacter calcoaceticus/baumannii complex - MIC*  CEFTAZIDIME >=64 RESISTANT Resistant     CEFTRIAXONE >=64 RESISTANT Resistant     CIPROFLOXACIN >=4 RESISTANT Resistant     GENTAMICIN >=16 RESISTANT Resistant     IMIPENEM >=16 RESISTANT Resistant     PIP/TAZO >=128 RESISTANT Resistant     TRIMETH/SULFA >=320 RESISTANT Resistant     CEFEPIME >=64 RESISTANT Resistant     AMPICILLIN/SULBACTAM RESISTANT Resistant     LEVOFLOXACIN Value in next row Intermediate      INTERMEDIATE4    * ABUNDANT ACINETOBACTER CALCOACETICUS/BAUMANNII COMPLEX   Methicillin resistant staphylococcus aureus - MIC*    CIPROFLOXACIN Value in next row Resistant      INTERMEDIATE4    ERYTHROMYCIN Value in next row Sensitive      INTERMEDIATE4    GENTAMICIN Value in next row Sensitive      INTERMEDIATE4    OXACILLIN Value in next row Resistant      INTERMEDIATE4    TETRACYCLINE Value in next row Sensitive      INTERMEDIATE4    VANCOMYCIN Value in next row Sensitive      INTERMEDIATE4    TRIMETH/SULFA Value in next row Sensitive      INTERMEDIATE4    CLINDAMYCIN Value in next row Sensitive      INTERMEDIATE4    RIFAMPIN Value in next row Sensitive      INTERMEDIATE4    Inducible Clindamycin Value in next row Sensitive      INTERMEDIATE4    * MODERATE METHICILLIN RESISTANT STAPHYLOCOCCUS AUREUS   Proteus mirabilis - MIC*    AMPICILLIN Value in next row Sensitive      INTERMEDIATE4    CEFAZOLIN Value in next row Sensitive      INTERMEDIATE4    CEFEPIME Value in next row Sensitive      INTERMEDIATE4    CEFTAZIDIME Value in next row Sensitive      INTERMEDIATE4    CEFTRIAXONE Value in next row Sensitive        INTERMEDIATE4    CIPROFLOXACIN Value in next row Sensitive      INTERMEDIATE4    GENTAMICIN Value in next row Sensitive      INTERMEDIATE4    IMIPENEM Value in next row Sensitive      INTERMEDIATE4    TRIMETH/SULFA Value in next row Sensitive      INTERMEDIATE4    AMPICILLIN/SULBACTAM Value in next row Sensitive      INTERMEDIATE4    PIP/TAZO Value in next row Sensitive      INTERMEDIATE4    * ABUNDANT PROTEUS MIRABILIS  Aerobic/Anaerobic Culture (surgical/deep wound)     Status: None   Collection Time: 02/18/17 12:14 PM  Result Value Ref Range Status   Specimen Description FLUID SACRAL ULCER  Final   Special Requests NONE  Final   Gram Stain   Final    RARE WBC PRESENT,BOTH PMN AND MONONUCLEAR MODERATE GRAM POSITIVE COCCI MODERATE GRAM POSITIVE RODS Performed at [facility], [address], [place], [place] [address]    Culture   Final    MODERATE PROTEUS MIRABILIS FEW METHICILLIN RESISTANT STAPHYLOCOCCUS AUREUS MIXED ANAEROBIC FLORA PRESENT.  CALL LAB IF FURTHER IID REQUIRED.    Report Status 02/23/2017 FINAL  Final   Organism ID, Bacteria PROTEUS MIRABILIS  Final   Organism ID, Bacteria METHICILLIN RESISTANT STAPHYLOCOCCUS AUREUS  Final      Susceptibility   Methicillin resistant staphylococcus aureus - MIC*    CIPROFLOXACIN >=8 RESISTANT Resistant     ERYTHROMYCIN  0.5 SENSITIVE Sensitive     GENTAMICIN <=0.5 SENSITIVE Sensitive     OXACILLIN >=4 RESISTANT Resistant     TETRACYCLINE <=1 SENSITIVE Sensitive     VANCOMYCIN 1 SENSITIVE Sensitive     TRIMETH/SULFA <=10 SENSITIVE Sensitive     CLINDAMYCIN <=0.25 SENSITIVE Sensitive     RIFAMPIN <=0.5 SENSITIVE Sensitive     Inducible Clindamycin NEGATIVE Sensitive     * FEW METHICILLIN RESISTANT STAPHYLOCOCCUS AUREUS   Proteus mirabilis - MIC*    AMPICILLIN <=2 SENSITIVE Sensitive     CEFAZOLIN <=4 SENSITIVE Sensitive     CEFEPIME <=1 SENSITIVE Sensitive     CEFTAZIDIME <=1 SENSITIVE Sensitive      CEFTRIAXONE <=1 SENSITIVE Sensitive     CIPROFLOXACIN <=0.25 SENSITIVE Sensitive     GENTAMICIN <=1 SENSITIVE Sensitive     IMIPENEM 2 SENSITIVE Sensitive     TRIMETH/SULFA <=20 SENSITIVE Sensitive     AMPICILLIN/SULBACTAM <=2 SENSITIVE Sensitive     PIP/TAZO <=4 SENSITIVE Sensitive     * MODERATE PROTEUS MIRABILIS    Studies/Results: Ir Fluoro Guide Cv Line Left  Result Date: 02/25/2017 INDICATION: Poor venous access. In need of durable intravenous access for antibiotic administration. EXAM: ULTRASOUND AND FLUOROSCOPIC GUIDED PICC LINE INSERTION MEDICATIONS: None. CONTRAST:  None FLUOROSCOPY TIME:  3 minutes, 36 seconds (9.1 mGy) COMPLICATIONS: None immediate. TECHNIQUE: The procedure, risks, benefits, and alternatives were explained to the patient and informed written consent was obtained. A timeout was performed prior to the initiation of the procedure. The left upper extremity was prepped with chlorhexidine in a sterile fashion, and a sterile drape was applied covering the operative field. Maximum barrier sterile technique with sterile gowns and gloves were used for the procedure. A timeout was performed prior to the initiation of the procedure. Local anesthesia was provided with 1% lidocaine. Under direct ultrasound guidance, the brachial vein was accessed with a micropuncture kit after the overlying soft tissues were anesthetized with 1% lidocaine. After the overlying soft tissues were anesthetized, a small venotomy incision was created and a micropuncture kit was utilized to access the left brachial vein. Real-time ultrasound guidance was utilized for vascular access including the acquisition of a permanent ultrasound image documenting patency of the accessed vessel. A guidewire was advanced to the level of the superior caval-atrial junction for measurement purposes and the PICC line was cut to length. A peel-away sheath was placed and a 34 cm, 5 [place], dual lumen was inserted to level of the  superior caval-atrial junction, however no was made of difficulty advancing the PICC line beyond the level of the left axilla, likely secondary to a stenosis at this location. A post procedure spot fluoroscopic was obtained. The catheter easily aspirated and flushed and was sutured in place. A dressing was placed. The patient tolerated the procedure well without immediate post procedural complication. FINDINGS: After catheter placement, the tip lies within the superior cavoatrial junction. The catheter aspirates and flushes normally and is ready for immediate use. IMPRESSION: Successful ultrasound and fluoroscopic guided placement of a left brachial vein approach, 34 cm, 5 [place], dual lumen PICC with tip at the superior caval-atrial junction. The PICC line is ready for immediate use. Would consider right upper extremity approach PICC placement during future attempted PICC lines given suspected stenosis at the level of the left axilla. Electronically Signed   By: Sandi Mariscal M.D.   On: 02/25/2017 09:50   Ir US Guide Vasc Access Left  Result Date: 02/25/2017 INDICATION: Poor venous access.  In need of durable intravenous access for antibiotic administration. EXAM: ULTRASOUND AND FLUOROSCOPIC GUIDED PICC LINE INSERTION MEDICATIONS: None. CONTRAST:  None FLUOROSCOPY TIME:  3 minutes, 36 seconds (9.1 mGy) COMPLICATIONS: None immediate. TECHNIQUE: The procedure, risks, benefits, and alternatives were explained to the patient and informed written consent was obtained. A timeout was performed prior to the initiation of the procedure. The left upper extremity was prepped with chlorhexidine in a sterile fashion, and a sterile drape was applied covering the operative field. Maximum barrier sterile technique with sterile gowns and gloves were used for the procedure. A timeout was performed prior to the initiation of the procedure. Local anesthesia was provided with 1% lidocaine. Under direct ultrasound guidance, the  brachial vein was accessed with a micropuncture kit after the overlying soft tissues were anesthetized with 1% lidocaine. After the overlying soft tissues were anesthetized, a small venotomy incision was created and a micropuncture kit was utilized to access the left brachial vein. Real-time ultrasound guidance was utilized for vascular access including the acquisition of a permanent ultrasound image documenting patency of the accessed vessel. A guidewire was advanced to the level of the superior caval-atrial junction for measurement purposes and the PICC line was cut to length. A peel-away sheath was placed and a 34 cm, 5 [place], dual lumen was inserted to level of the superior caval-atrial junction, however no was made of difficulty advancing the PICC line beyond the level of the left axilla, likely secondary to a stenosis at this location. A post procedure spot fluoroscopic was obtained. The catheter easily aspirated and flushed and was sutured in place. A dressing was placed. The patient tolerated the procedure well without immediate post procedural complication. FINDINGS: After catheter placement, the tip lies within the superior cavoatrial junction. The catheter aspirates and flushes normally and is ready for immediate use. IMPRESSION: Successful ultrasound and fluoroscopic guided placement of a left brachial vein approach, 34 cm, 5 French, dual lumen PICC with tip at the superior caval-atrial junction. The PICC line is ready for immediate use. Would consider right upper extremity approach PICC placement during future attempted PICC lines given suspected stenosis at the level of the left axilla. Electronically Signed   By: Sandi Mariscal M.D.   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 Excell Seltzer of skin, subcutaneous tissue and periosteum left ischial decubitus ulcer--> cultures.    Cultures have yielded Proteus (fairly S), MRSA  (tetracycline S) and MDR Acinetobacter R to all antibiotics tested  We shifted today to Dapto (for MRSA) levaquin (for proteus and her MDR was I to FQ) and polymyxin but she then had hives with infusion of polymyxin  I DO wonder if part of her allergic reaction was anxiety driven but we have stopped the polymyxin  I am starting oral minocycline for now.  Organism will be sent for S to polymyxin, Tygacil,  minocycline.  Allergic reaction to polymyxin: see above. I will enter it as an allergy for now  I spent greater than 40 minutes with the patient including greater than 50% of time in face to face counsel of the patient and in coordination of her  Care with [facility] pharmacy, with counseling re the nature of the MDR acinetobacter, MRSA, proteus and plan of care.     LOS: 9 days   Alcide Evener 02/25/2017, 3:30 PM

## 2017-02-25 NOTE — Progress Notes (Signed)
Patient ID: [name], female   DOB: Oct 18, 1980, 36 y.o.   MRN: [id number]  [facility] Surgery Progress Note  7 Days Post-Op  Subjective: CC-  No complaints. PICC line placed this AM. Tolerating dressing changes well. Wound culture grew Acinetobacter resistant to all antibiotic tested for and Proteus pansensitive and MRSA. Awaiting ID recommendations on antibiotics.  Objective: Vital signs in last 24 hours: Temp:  [98.6 F (37 C)-99.2 F (37.3 C)] 99.1 F (37.3 C) (11/26 0605) Pulse Rate:  [89-93] 89 (11/26 0605) Resp:  [16] 16 (11/26 0605) BP: (96-114)/(52-64) 96/52 (11/26 0605) SpO2:  [100 %] 100 % (11/26 0605) Last BM Date: 02/22/17  Intake/Output from previous day: 11/25 0701 - 11/26 0700 In: 1287 [P.O.:720; I.V.:160; IV Piggyback:407] Out: 2150 [Urine:2150] Intake/Output this shift: No intake/output data recorded.  PE: Gen: Alert, NAD, pleasant HEENT: EOM's intact, pupils equal and round Pulm: effort normal Psych: A&Ox3 GU: left ischial wound s/p I&D with healthy red/pink tissue, no purulent drainage or surrounding erythema, proximal aspect has about 2cm tunneling     Lab Results:  No results for input(s): WBC, HGB, HCT, PLT in the last 72 hours. BMET No results for input(s): NA, K, CL, CO2, GLUCOSE, BUN, CREATININE, CALCIUM in the last 72 hours. PT/INR No results for input(s): LABPROT, INR in the last 72 hours. CMP     Component Value Date/Time   NA 141 02/19/2017 0512   K 4.1 02/19/2017 0512   CL 104 02/19/2017 0512   CO2 28 02/19/2017 0512   GLUCOSE 92 02/19/2017 0512   BUN 10 02/19/2017 0512   CREATININE <0.30 (L) 02/19/2017 0512   CREATININE 0.41 (L) 03/22/2016 1205   CALCIUM 9.0 02/19/2017 0512   PROT 7.0 02/13/2017 1935   ALBUMIN 4.2 02/13/2017 1935   AST 16 02/13/2017 1935   ALT 11 (L) 02/13/2017 1935   ALKPHOS 110 02/13/2017 1935   BILITOT 0.6 02/13/2017 1935   GFRNONAA NOT CALCULATED 02/19/2017 0512   GFRAA NOT CALCULATED  02/19/2017 0512   Lipase     Component Value Date/Time   LIPASE 22 10/17/2016 1043       Studies/Results: Ir Fluoro Guide Cv Line Left  Result Date: 02/25/2017 INDICATION: Poor venous access. In need of durable intravenous access for antibiotic administration. EXAM: ULTRASOUND AND FLUOROSCOPIC GUIDED PICC LINE INSERTION MEDICATIONS: None. CONTRAST:  None FLUOROSCOPY TIME:  3 minutes, 36 seconds (9.1 mGy) COMPLICATIONS: None immediate. TECHNIQUE: The procedure, risks, benefits, and alternatives were explained to the patient and informed written consent was obtained. A timeout was performed prior to the initiation of the procedure. The left upper extremity was prepped with chlorhexidine in a sterile fashion, and a sterile drape was applied covering the operative field. Maximum barrier sterile technique with sterile gowns and gloves were used for the procedure. A timeout was performed prior to the initiation of the procedure. Local anesthesia was provided with 1% lidocaine. Under direct ultrasound guidance, the brachial vein was accessed with a micropuncture kit after the overlying soft tissues were anesthetized with 1% lidocaine. After the overlying soft tissues were anesthetized, a small venotomy incision was created and a micropuncture kit was utilized to access the left brachial vein. Real-time ultrasound guidance was utilized for vascular access including the acquisition of a permanent ultrasound image documenting patency of the accessed vessel. A guidewire was advanced to the level of the superior caval-atrial junction for measurement purposes and the PICC line was cut to length. A peel-away sheath was placed and a  34 cm, 5 [place], dual lumen was inserted to level of the superior caval-atrial junction, however no was made of difficulty advancing the PICC line beyond the level of the left axilla, likely secondary to a stenosis at this location. A post procedure spot fluoroscopic was obtained. The  catheter easily aspirated and flushed and was sutured in place. A dressing was placed. The patient tolerated the procedure well without immediate post procedural complication. FINDINGS: After catheter placement, the tip lies within the superior cavoatrial junction. The catheter aspirates and flushes normally and is ready for immediate use. IMPRESSION: Successful ultrasound and fluoroscopic guided placement of a left brachial vein approach, 34 cm, 5 French, dual lumen PICC with tip at the superior caval-atrial junction. The PICC line is ready for immediate use. Would consider right upper extremity approach PICC placement during future attempted PICC lines given suspected stenosis at the level of the left axilla. Electronically Signed   By: Sandi Mariscal M.D.   On: 02/25/2017 09:50   Ir US Guide Vasc Access Left  Result Date: 02/25/2017 INDICATION: Poor venous access. In need of durable intravenous access for antibiotic administration. EXAM: ULTRASOUND AND FLUOROSCOPIC GUIDED PICC LINE INSERTION MEDICATIONS: None. CONTRAST:  None FLUOROSCOPY TIME:  3 minutes, 36 seconds (9.1 mGy) COMPLICATIONS: None immediate. TECHNIQUE: The procedure, risks, benefits, and alternatives were explained to the patient and informed written consent was obtained. A timeout was performed prior to the initiation of the procedure. The left upper extremity was prepped with chlorhexidine in a sterile fashion, and a sterile drape was applied covering the operative field. Maximum barrier sterile technique with sterile gowns and gloves were used for the procedure. A timeout was performed prior to the initiation of the procedure. Local anesthesia was provided with 1% lidocaine. Under direct ultrasound guidance, the brachial vein was accessed with a micropuncture kit after the overlying soft tissues were anesthetized with 1% lidocaine. After the overlying soft tissues were anesthetized, a small venotomy incision was created and a micropuncture kit  was utilized to access the left brachial vein. Real-time ultrasound guidance was utilized for vascular access including the acquisition of a permanent ultrasound image documenting patency of the accessed vessel. A guidewire was advanced to the level of the superior caval-atrial junction for measurement purposes and the PICC line was cut to length. A peel-away sheath was placed and a 34 cm, 5 [place], dual lumen was inserted to level of the superior caval-atrial junction, however no was made of difficulty advancing the PICC line beyond the level of the left axilla, likely secondary to a stenosis at this location. A post procedure spot fluoroscopic was obtained. The catheter easily aspirated and flushed and was sutured in place. A dressing was placed. The patient tolerated the procedure well without immediate post procedural complication. FINDINGS: After catheter placement, the tip lies within the superior cavoatrial junction. The catheter aspirates and flushes normally and is ready for immediate use. IMPRESSION: Successful ultrasound and fluoroscopic guided placement of a left brachial vein approach, 34 cm, 5 French, dual lumen PICC with tip at the superior caval-atrial junction. The PICC line is ready for immediate use. Would consider right upper extremity approach PICC placement during future attempted PICC lines given suspected stenosis at the level of the left axilla. Electronically Signed   By: Sandi Mariscal M.D.   On: 02/25/2017 09:50    Anti-infectives: Anti-infectives (From admission, onward)   Start     Dose/Rate Route Frequency Ordered Stop   02/25/17 1000  cefTAZidime (FORTAZ)  2 g in dextrose 5 % 50 mL IVPB     2 g 100 mL/hr over 30 Minutes Intravenous Every 8 hours 02/25/17 0942     02/20/17 1600  meropenem (MERREM) 1 g in sodium chloride 0.9 % 100 mL IVPB  Status:  Discontinued     1 g 200 mL/hr over 30 Minutes Intravenous Every 8 hours 02/20/17 1456 02/25/17 0942   02/19/17 2200  metroNIDAZOLE  (FLAGYL) tablet 500 mg  Status:  Discontinued     500 mg Oral Every 8 hours 02/19/17 2043 02/20/17 1430   02/18/17 1800  cefTAZidime (FORTAZ) 2 g in dextrose 5 % 50 mL IVPB  Status:  Discontinued     2 g 100 mL/hr over 30 Minutes Intravenous Every 8 hours 02/18/17 1631 02/20/17 1454   02/18/17 1800  DAPTOmycin (CUBICIN) 360 mg in sodium chloride 0.9 % IVPB     360 mg 214.4 mL/hr over 30 Minutes Intravenous Every 24 hours 02/18/17 1633         Assessment/Plan Multiple sclerosis/transverse myelitis and paraplegia Constipation Chronic microcytic anemia/iron deficiency anemia  Left ischial decubitus ulcer S/pDebridement skin, subcutaneous tissue and periosteum left ischial decubitus ulcer11/19 Dr. Excell Seltzer - POD 7 -cultures grew PROTEUS MIRABILIS, MRSA, ACINETOBACTER CALCOACETICUS/BAUMANNII COMPLEX  - ID following  ID -fortaz 11/19>>11/21 restarted 11/26>>, merrem 11/21>>11/26, daptomycin 11/19>>, flagyl 11/20>>11/21 FEN -regular diet VTE -SCDs, lovenox Foley -none Follow up -wound clinic  Plan- Wound healing well, will not need further surgical debridement. Continue BID wet to dry dressing changes. Antibiotics per ID. Will continue to follow every few days.   LOS: 9 days    Wellington Hampshire , [facility] Surgery 02/25/2017, 10:15 AM Pager: [phone] Consults: [phone] Mon-Fri 7:00 am-4:30 pm Sat-Sun 7:00 am-11:30 am

## 2017-02-25 NOTE — Progress Notes (Signed)
Pt was started on Polymyxin and arm started turning red, itching, and whelps were starting to form.  IV was stopped and MD was notified.

## 2017-02-25 NOTE — Progress Notes (Signed)
MEDICATION-RELATED CONSULT NOTE   IR Procedure Consult - Anticoagulant/Antiplatelet PTA/Inpatient Med List Review by Pharmacist    Procedure: PICC line    Completed:  11/26 0950  Post-Procedural bleeding risk per IR MD assessment:  Low  Antithrombotic medications on inpatient or PTA profile prior to procedure:   Enoxaparin prophylaxis    Recommended restart time per IR Post-Procedure Guidelines:  At least 4h    Other considerations:      Plan:      Enoxaparin 40mg  SQ q24h  Dose was due at 14:00 but not given.  Reschedule for later this evening.  Doreene Eland, PharmD, BCPS.   Pager: [phone] 02/25/2017 4:56 PM

## 2017-02-25 NOTE — Progress Notes (Signed)
[facility] PROGRESS NOTE    Progress Note  [name]  HFW:[id number] DOB: 1981-03-12 DOA: 02/13/2017 PCP: Lennie Odor, PA-C     Brief Narrative:   [name] is an 36 y.o. 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 Gen. surgery and ID was consulted, she is status post debridement on 02/18/2017. ID on board, wound culture grew Acinetobacter resistant to all antibiotic tested for and Proteus pansensitive and MRSA. Awaiting IDs recommendation on antibiotics. PICC line was placed on 02/25/2017.  Multiple sclerosis and paraplegia: Wheelchair-bound continue supportive care.  Constipation: Continue bowel regimen.  Hypokalemia: Resolved.  Chronic microcytic anemia: Only to follow-up with primary care doctor as an outpatient. Hemoglobin has remained stable.  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   DVT prophylaxis: lovenox Family Communication:none Disposition Plan/Barrier to D/C: unable to determine Code Status:     Code Status Orders  (From admission, onward)        Start     Ordered   02/14/17 0101  Full code  Continuous     02/14/17 0100    Code Status History    Date Active Date Inactive Code Status Order ID Comments User Context   11/15/2016 02:45 11/17/2016 18:48 Full Code [id number]  [name], MD Inpatient   07/17/2016 23:21 07/19/2016 20:47 Full Code [id number]  Ivor Costa, MD ED   02/15/2016 22:17 02/25/2016 20:01 Full Code [id number]  [name], MD Inpatient   01/07/2016 09:58 01/09/2016 14:24 Full Code [id number]  [name], MD Inpatient   10/29/2014 21:14 10/31/2014 17:11 Full Code [id number]  [name], MD Inpatient   09/27/2014 22:49 09/28/2014 20:46 Full Code  [id number]  [name], DO ED   03/30/2013 12:15 04/02/2013 17:32 Full Code [id number]  [name], MD Inpatient   02/16/2012 01:46 02/18/2012 18:53 Full Code [id number]  [name]., DO ED        IV Access:    Peripheral IV   Procedures and diagnostic studies:   Ir Fluoro Guide Cv Line Left  Result Date: 02/25/2017 INDICATION: Poor venous access. In need of durable intravenous access for antibiotic administration. EXAM: ULTRASOUND AND FLUOROSCOPIC GUIDED PICC LINE INSERTION MEDICATIONS: None. CONTRAST:  None FLUOROSCOPY TIME:  3 minutes, 36 seconds (9.1 mGy) COMPLICATIONS: None immediate. TECHNIQUE: The procedure, risks, benefits, and alternatives were explained to the patient and informed written consent was obtained. A timeout was performed prior to the initiation of the procedure. The left upper extremity was prepped with chlorhexidine in a sterile fashion, and a sterile drape was applied covering the operative field. Maximum barrier sterile technique with sterile gowns and gloves were used for the procedure. A timeout was performed prior to the initiation of the procedure. Local anesthesia was provided with 1% lidocaine. Under direct ultrasound guidance, the brachial vein was accessed with a micropuncture kit after the overlying soft tissues were anesthetized with 1% lidocaine. After the overlying soft tissues were anesthetized, a small venotomy incision was created and a micropuncture kit was utilized to access the left brachial vein. Real-time ultrasound guidance was utilized for vascular access including the acquisition of a permanent ultrasound image documenting patency of the accessed vessel. A guidewire was advanced to the level of the superior caval-atrial junction for measurement purposes and the PICC line was cut  to length. A peel-away sheath was placed and a 34 cm, 5 [place], dual lumen was inserted to level of the superior caval-atrial junction, however no was made of  difficulty advancing the PICC line beyond the level of the left axilla, likely secondary to a stenosis at this location. A post procedure spot fluoroscopic was obtained. The catheter easily aspirated and flushed and was sutured in place. A dressing was placed. The patient tolerated the procedure well without immediate post procedural complication. FINDINGS: After catheter placement, the tip lies within the superior cavoatrial junction. The catheter aspirates and flushes normally and is ready for immediate use. IMPRESSION: Successful ultrasound and fluoroscopic guided placement of a left brachial vein approach, 34 cm, 5 French, dual lumen PICC with tip at the superior caval-atrial junction. The PICC line is ready for immediate use. Would consider right upper extremity approach PICC placement during future attempted PICC lines given suspected stenosis at the level of the left axilla. Electronically Signed   By: Sandi Mariscal M.D.   On: 02/25/2017 09:50   Ir US Guide Vasc Access Left  Result Date: 02/25/2017 INDICATION: Poor venous access. In need of durable intravenous access for antibiotic administration. EXAM: ULTRASOUND AND FLUOROSCOPIC GUIDED PICC LINE INSERTION MEDICATIONS: None. CONTRAST:  None FLUOROSCOPY TIME:  3 minutes, 36 seconds (9.1 mGy) COMPLICATIONS: None immediate. TECHNIQUE: The procedure, risks, benefits, and alternatives were explained to the patient and informed written consent was obtained. A timeout was performed prior to the initiation of the procedure. The left upper extremity was prepped with chlorhexidine in a sterile fashion, and a sterile drape was applied covering the operative field. Maximum barrier sterile technique with sterile gowns and gloves were used for the procedure. A timeout was performed prior to the initiation of the procedure. Local anesthesia was provided with 1% lidocaine. Under direct ultrasound guidance, the brachial vein was accessed with a micropuncture kit after the  overlying soft tissues were anesthetized with 1% lidocaine. After the overlying soft tissues were anesthetized, a small venotomy incision was created and a micropuncture kit was utilized to access the left brachial vein. Real-time ultrasound guidance was utilized for vascular access including the acquisition of a permanent ultrasound image documenting patency of the accessed vessel. A guidewire was advanced to the level of the superior caval-atrial junction for measurement purposes and the PICC line was cut to length. A peel-away sheath was placed and a 34 cm, 5 [place], dual lumen was inserted to level of the superior caval-atrial junction, however no was made of difficulty advancing the PICC line beyond the level of the left axilla, likely secondary to a stenosis at this location. A post procedure spot fluoroscopic was obtained. The catheter easily aspirated and flushed and was sutured in place. A dressing was placed. The patient tolerated the procedure well without immediate post procedural complication. FINDINGS: After catheter placement, the tip lies within the superior cavoatrial junction. The catheter aspirates and flushes normally and is ready for immediate use. IMPRESSION: Successful ultrasound and fluoroscopic guided placement of a left brachial vein approach, 34 cm, 5 French, dual lumen PICC with tip at the superior caval-atrial junction. The PICC line is ready for immediate use. Would consider right upper extremity approach PICC placement during future attempted PICC lines given suspected stenosis at the level of the left axilla. Electronically Signed   By: Sandi Mariscal M.D.   On: 02/25/2017 09:50     Medical Consultants:    None.  Anti-Infectives:   Continue IV Meropenem and  daptomycin along with Flagyl.  Subjective:    [name] no new complains.  Objective:    Vitals:   02/24/17 0630 02/24/17 1236 02/24/17 2155 02/25/17 0605  BP: (!) 99/59 100/60 114/64 (!) 96/52  Pulse:  93 90 93 89  Resp: 18 16 16 16   Temp: 98.9 F (37.2 C) 98.6 F (37 C) 99.2 F (37.3 C) 99.1 F (37.3 C)  TempSrc: Oral Oral Oral Oral  SpO2:  100% 100% 100%  Weight:      Height:        Intake/Output Summary (Last 24 hours) at 02/25/2017 1006 Last data filed at 02/25/2017 0500 Gross per 24 hour  Intake 907 ml  Output 1450 ml  Net -543 ml   Filed Weights   02/14/17 0057  Weight: 59.7 kg (131 lb 9.8 oz)    Exam: General exam: In no acute distress. Respiratory system: Good air movement and clear to auscultation. Cardiovascular system: S1 & S2 heard, RRR. No JVD, murmurs, rubs, gallops or clicks.  Gastrointestinal system: Abdomen is nondistended, soft and nontender.  Extremities: No pedal edema. Psychiatry: Judgement and insight appear normal. Mood & affect appropriate.    Data Reviewed:    Labs: Basic Metabolic Panel: Recent Labs  Lab 02/19/17 0512  NA 141  K 4.1  CL 104  CO2 28  GLUCOSE 92  BUN 10  CREATININE <0.30*  CALCIUM 9.0   GFR CrCl cannot be calculated (This lab value cannot be used to calculate CrCl because it is not a number: <0.30). Liver Function Tests: No results for input(s): AST, ALT, ALKPHOS, BILITOT, PROT, ALBUMIN in the last 168 hours. No results for input(s): LIPASE, AMYLASE in the last 168 hours. No results for input(s): AMMONIA in the last 168 hours. Coagulation profile No results for input(s): INR, PROTIME in the last 168 hours.  CBC: Recent Labs  Lab 02/19/17 0512 02/20/17 0557 02/22/17 0353  WBC 5.7 4.3 5.8  HGB 8.3* 7.7* 8.0*  HCT 29.2* 26.8* 27.1*  MCV 73.4* 74.0* 72.5*  PLT 208 196 242   Cardiac Enzymes: Recent Labs  Lab 02/18/17 1630 02/25/17 0529  CKTOTAL 21* 15*   BNP (last 3 results) No results for input(s): PROBNP in the last 8760 hours. CBG: No results for input(s): GLUCAP in the last 168 hours. D-Dimer: No results for input(s): DDIMER in the last 72 hours. Hgb A1c: No results for input(s): HGBA1C in  the last 72 hours. Lipid Profile: No results for input(s): CHOL, HDL, LDLCALC, TRIG, CHOLHDL, LDLDIRECT in the last 72 hours. Thyroid function studies: No results for input(s): TSH, T4TOTAL, T3FREE, THYROIDAB in the last 72 hours.  Invalid input(s): FREET3 Anemia work up: No results for input(s): VITAMINB12, FOLATE, FERRITIN, TIBC, IRON, RETICCTPCT in the last 72 hours. Sepsis Labs: Recent Labs  Lab 02/19/17 0512 02/20/17 0557 02/22/17 0353  WBC 5.7 4.3 5.8   Microbiology Recent Results (from the past 240 hour(s))  Aerobic/Anaerobic Culture (surgical/deep wound)     Status: None (Preliminary result)   Collection Time: 02/18/17 12:02 PM  Result Value Ref Range Status   Specimen Description TISSUE SOFT LT SACRAL ULCER  Final   Special Requests TISSUE SENT INSIDE SWABS  Final   Gram Stain   Final    FEW WBC PRESENT,BOTH PMN AND MONONUCLEAR ABUNDANT GRAM POSITIVE COCCI ABUNDANT GRAM NEGATIVE RODS ABUNDANT GRAM POSITIVE RODS    Culture   Final    ABUNDANT PROTEUS MIRABILIS ABUNDANT ACINETOBACTER CALCOACETICUS/BAUMANNII COMPLEX MULTI-DRUG RESISTANT ORGANISM MODERATE METHICILLIN  RESISTANT STAPHYLOCOCCUS AUREUS MIXED ANAEROBIC FLORA PRESENT.  CALL LAB IF FURTHER IID REQUIRED. RESULT CALLED TO, READ BACK BY AND VERIFIED WITH: K BLUM,RN AT 1142 02/24/17 BY L BENFIELD CONCERNING RESISTANT ACINETOBACTER WILL REFER ACINETOBACTER FOR TETRACYCLINE AND RIFAMPIN PER PHYSICIAN REQUEST Performed at [facility], [place] [address], [place], [place] [address]    Report Status PENDING  Incomplete   Organism ID, Bacteria PROTEUS MIRABILIS  Final   Organism ID, Bacteria METHICILLIN RESISTANT STAPHYLOCOCCUS AUREUS  Final   Organism ID, Bacteria ACINETOBACTER CALCOACETICUS/BAUMANNII COMPLEX  Final      Susceptibility   Acinetobacter calcoaceticus/baumannii complex - MIC*    CEFTAZIDIME >=64 RESISTANT Resistant     CEFTRIAXONE >=64 RESISTANT Resistant     CIPROFLOXACIN >=4 RESISTANT Resistant      GENTAMICIN >=16 RESISTANT Resistant     IMIPENEM >=16 RESISTANT Resistant     PIP/TAZO >=128 RESISTANT Resistant     TRIMETH/SULFA >=320 RESISTANT Resistant     CEFEPIME >=64 RESISTANT Resistant     AMPICILLIN/SULBACTAM RESISTANT Resistant     * ABUNDANT ACINETOBACTER CALCOACETICUS/BAUMANNII COMPLEX   Methicillin resistant staphylococcus aureus - MIC*    CIPROFLOXACIN >=8 RESISTANT Resistant     ERYTHROMYCIN <=0.25 SENSITIVE Sensitive     GENTAMICIN <=0.5 SENSITIVE Sensitive     OXACILLIN >=4 RESISTANT Resistant     TETRACYCLINE <=1 SENSITIVE Sensitive     VANCOMYCIN 1 SENSITIVE Sensitive     TRIMETH/SULFA <=10 SENSITIVE Sensitive     CLINDAMYCIN <=0.25 SENSITIVE Sensitive     RIFAMPIN <=0.5 SENSITIVE Sensitive     Inducible Clindamycin NEGATIVE Sensitive     * MODERATE METHICILLIN RESISTANT STAPHYLOCOCCUS AUREUS   Proteus mirabilis - MIC*    AMPICILLIN <=2 SENSITIVE Sensitive     CEFAZOLIN 8 SENSITIVE Sensitive     CEFEPIME <=1 SENSITIVE Sensitive     CEFTAZIDIME <=1 SENSITIVE Sensitive     CEFTRIAXONE <=1 SENSITIVE Sensitive     CIPROFLOXACIN <=0.25 SENSITIVE Sensitive     GENTAMICIN <=1 SENSITIVE Sensitive     IMIPENEM 1 SENSITIVE Sensitive     TRIMETH/SULFA <=20 SENSITIVE Sensitive     AMPICILLIN/SULBACTAM <=2 SENSITIVE Sensitive     PIP/TAZO <=4 SENSITIVE Sensitive     * ABUNDANT PROTEUS MIRABILIS  Aerobic/Anaerobic Culture (surgical/deep wound)     Status: None   Collection Time: 02/18/17 12:14 PM  Result Value Ref Range Status   Specimen Description FLUID SACRAL ULCER  Final   Special Requests NONE  Final   Gram Stain   Final    RARE WBC PRESENT,BOTH PMN AND MONONUCLEAR MODERATE GRAM POSITIVE COCCI MODERATE GRAM POSITIVE RODS Performed at [facility], [address] [place], [place] [address]    Culture   Final    MODERATE PROTEUS MIRABILIS FEW METHICILLIN RESISTANT STAPHYLOCOCCUS AUREUS MIXED ANAEROBIC FLORA PRESENT.  CALL LAB IF FURTHER IID  REQUIRED.    Report Status 02/23/2017 FINAL  Final   Organism ID, Bacteria PROTEUS MIRABILIS  Final   Organism ID, Bacteria METHICILLIN RESISTANT STAPHYLOCOCCUS AUREUS  Final      Susceptibility   Methicillin resistant staphylococcus aureus - MIC*    CIPROFLOXACIN >=8 RESISTANT Resistant     ERYTHROMYCIN 0.5 SENSITIVE Sensitive     GENTAMICIN <=0.5 SENSITIVE Sensitive     OXACILLIN >=4 RESISTANT Resistant     TETRACYCLINE <=1 SENSITIVE Sensitive     VANCOMYCIN 1 SENSITIVE Sensitive     TRIMETH/SULFA <=10 SENSITIVE Sensitive     CLINDAMYCIN <=0.25 SENSITIVE Sensitive  RIFAMPIN <=0.5 SENSITIVE Sensitive     Inducible Clindamycin NEGATIVE Sensitive     * FEW METHICILLIN RESISTANT STAPHYLOCOCCUS AUREUS   Proteus mirabilis - MIC*    AMPICILLIN <=2 SENSITIVE Sensitive     CEFAZOLIN <=4 SENSITIVE Sensitive     CEFEPIME <=1 SENSITIVE Sensitive     CEFTAZIDIME <=1 SENSITIVE Sensitive     CEFTRIAXONE <=1 SENSITIVE Sensitive     CIPROFLOXACIN <=0.25 SENSITIVE Sensitive     GENTAMICIN <=1 SENSITIVE Sensitive     IMIPENEM 2 SENSITIVE Sensitive     TRIMETH/SULFA <=20 SENSITIVE Sensitive     AMPICILLIN/SULBACTAM <=2 SENSITIVE Sensitive     PIP/TAZO <=4 SENSITIVE Sensitive     * MODERATE PROTEUS MIRABILIS     Medications:   . baclofen  20 mg Oral TID  . bisacodyl  10 mg Rectal Daily  . enoxaparin (LOVENOX) injection  40 mg Subcutaneous Q24H  . gabapentin  800 mg Oral TID  . lactose free nutrition  237 mL Oral TID WC  . lamoTRIgine  125 mg Oral Daily  . lidocaine      . lubiprostone  24 mcg Oral Q breakfast  . multivitamin  15 mL Oral Daily  . pantoprazole  20 mg Oral Daily  . senna-docusate  1 tablet Oral BID  . sertraline  100 mg Oral Daily   Continuous Infusions: . 0.9 % NaCl with KCl 20 mEq / L 10 mL/hr at 02/23/17 0924  . cefTAZidime (FORTAZ)  IV    . DAPTOmycin (CUBICIN)  IV Stopped (02/24/17 1832)     LOS: 9 days   Charlynne Cousins  [facility] Pager  [phone]  *Please refer to [email or web address], password [email or web address] to get updated schedule on who will round on this patient, as hospitalists switch teams weekly. If 7PM-7AM, please contact night-coverage at [email or web address], password [email or web address] for any overnight needs.  02/25/2017, 10:06 AM

## 2017-02-26 MED ORDER — LEVOFLOXACIN 750 MG PO TABS
750.0000 mg | ORAL_TABLET | Freq: Every day | ORAL | Status: DC
  Administered 2017-02-26 – 2017-02-28 (×3): 750 mg via ORAL
  Filled 2017-02-26 (×3): qty 1

## 2017-02-26 NOTE — Progress Notes (Signed)
Subjective:  No new complaints today and tolerated oral minocycline, iv levaquin and iv dapto   Antibiotics:  Anti-infectives (From admission, onward)   Start     Dose/Rate Route Frequency Ordered Stop   02/25/17 2330  polymyxin B 89.55 mg in dextrose 5 % 500 mL IVPB  Status:  Discontinued     1.5 mg/kg  59.7 kg 500 mL/hr over 1 Hours Intravenous Every 12 hours 02/25/17 1127 02/25/17 1442   02/25/17 1800  levofloxacin (LEVAQUIN) IVPB 750 mg     750 mg 100 mL/hr over 90 Minutes Intravenous Every 24 hours 02/25/17 1122     02/25/17 1600  minocycline (MINOCIN,DYNACIN) capsule 100 mg     100 mg Oral 2 times daily 02/25/17 1442     02/25/17 1230  polymyxin B 149.25 mg in dextrose 5 % 500 mL IVPB  Status:  Discontinued     2.5 mg/kg  59.7 kg 250 mL/hr over 2 Hours Intravenous  Once 02/25/17 1127 02/25/17 1442   02/25/17 1000  cefTAZidime (FORTAZ) 2 g in dextrose 5 % 50 mL IVPB  Status:  Discontinued     2 g 100 mL/hr over 30 Minutes Intravenous Every 8 hours 02/25/17 0942 02/25/17 1122   02/20/17 1600  meropenem (MERREM) 1 g in sodium chloride 0.9 % 100 mL IVPB  Status:  Discontinued     1 g 200 mL/hr over 30 Minutes Intravenous Every 8 hours 02/20/17 1456 02/25/17 0942   02/19/17 2200  metroNIDAZOLE (FLAGYL) tablet 500 mg  Status:  Discontinued     500 mg Oral Every 8 hours 02/19/17 2043 02/20/17 1430   02/18/17 1800  cefTAZidime (FORTAZ) 2 g in dextrose 5 % 50 mL IVPB  Status:  Discontinued     2 g 100 mL/hr over 30 Minutes Intravenous Every 8 hours 02/18/17 1631 02/20/17 1454   02/18/17 1800  DAPTOmycin (CUBICIN) 360 mg in sodium chloride 0.9 % IVPB     360 mg 214.4 mL/hr over 30 Minutes Intravenous Every 24 hours 02/18/17 1633        Medications: Scheduled Meds: . baclofen  20 mg Oral TID  . bisacodyl  10 mg Rectal Daily  . enoxaparin (LOVENOX) injection  40 mg Subcutaneous Q24H  . gabapentin  800 mg Oral TID  . lactose free nutrition  237 mL Oral TID WC  .  lamoTRIgine  125 mg Oral Daily  . lubiprostone  24 mcg Oral Q breakfast  . minocycline  100 mg Oral BID  . pantoprazole  20 mg Oral Daily  . senna-docusate  1 tablet Oral BID  . sertraline  100 mg Oral Daily   Continuous Infusions: . 0.9 % NaCl with KCl 20 mEq / L 10 mL/hr at 02/25/17 2309  . DAPTOmycin (CUBICIN)  IV Stopped (02/25/17 1660)  . levofloxacin (LEVAQUIN) IV Stopped (02/25/17 2257)   PRN Meds:.diazepam, diphenhydrAMINE, fluticasone, ibuprofen, ondansetron (ZOFRAN) IV, sodium chloride flush    Objective: Weight change:   Intake/Output Summary (Last 24 hours) at 02/26/2017 1503 Last data filed at 02/26/2017 1400 Gross per 24 hour  Intake 1364.33 ml  Output 1676 ml  Net -311.67 ml   Blood pressure 131/64, pulse 77, temperature 98.4 F (36.9 C), temperature source Oral, resp. rate 16, height 5' 3"  (1.6 m), weight 131 lb 9.8 oz (59.7 kg), SpO2 98 %. Temp:  [98.4 F (36.9 C)-99 F (37.2 C)] 98.4 F (36.9 C) (11/27 1329) Pulse Rate:  [77-88] 77 (11/27  1329) Resp:  [16] 16 (11/27 1329) BP: (103-131)/(57-67) 131/64 (11/27 1329) SpO2:  [97 %-98 %] 98 % (11/27 1329)  Physical Exam: General: Alert and awake, oriented x3, not in any acute distress. CV: rr Pulm; no wheezes, resp distress Neuro: no new focal deficits  Skin: Hives resolved see picture from yesterday with  hives on arm where polymyxin infused  02/25/17:      CBC:  CBC Latest Ref Rng & Units 02/22/2017 02/20/2017 02/19/2017  WBC 4.0 - 10.5 K/uL 5.8 4.3 5.7  Hemoglobin 12.0 - 15.0 g/dL 8.0(L) 7.7(L) 8.3(L)  Hematocrit 36.0 - 46.0 % 27.1(L) 26.8(L) 29.2(L)  Platelets 150 - 400 K/uL 242 196 208     BMET No results for input(s): NA, K, CL, CO2, GLUCOSE, BUN, CREATININE, CALCIUM in the last 72 hours.   Liver Panel  No results for input(s): PROT, ALBUMIN, AST, ALT, ALKPHOS, BILITOT, BILIDIR, IBILI in the last 72 hours.     Sedimentation Rate No results for input(s): ESRSEDRATE in the  last 72 hours. C-Reactive Protein No results for input(s): CRP in the last 72 hours.  Micro Results: Recent Results (from the past 720 hour(s))  Wet prep, genital     Status: Abnormal   Collection Time: 02/13/17  6:38 PM  Result Value Ref Range Status   Yeast Wet Prep HPF POC NONE SEEN NONE SEEN Final   Trich, Wet Prep NONE SEEN NONE SEEN Final   Clue Cells Wet Prep HPF POC NONE SEEN NONE SEEN Final   WBC, Wet Prep HPF POC MODERATE (A) NONE SEEN Final   Sperm NONE SEEN  Final  MRSA PCR Screening     Status: None   Collection Time: 02/14/17 11:20 AM  Result Value Ref Range Status   MRSA by PCR NEGATIVE NEGATIVE Final    Comment:        The GeneXpert MRSA Assay (FDA approved for NASAL specimens only), is one component of a comprehensive MRSA colonization surveillance program. It is not intended to diagnose MRSA infection nor to guide or monitor treatment for MRSA infections.   Aerobic/Anaerobic Culture (surgical/deep wound)     Status: None (Preliminary result)   Collection Time: 02/18/17 12:02 PM  Result Value Ref Range Status   Specimen Description TISSUE SOFT LT SACRAL ULCER  Final   Special Requests TISSUE SENT INSIDE SWABS  Final   Gram Stain   Final    FEW WBC PRESENT,BOTH PMN AND MONONUCLEAR ABUNDANT GRAM POSITIVE COCCI ABUNDANT GRAM NEGATIVE RODS ABUNDANT GRAM POSITIVE RODS    Culture   Final    ABUNDANT PROTEUS MIRABILIS ABUNDANT ACINETOBACTER CALCOACETICUS/BAUMANNII COMPLEX MULTI-DRUG RESISTANT ORGANISM MODERATE METHICILLIN RESISTANT STAPHYLOCOCCUS AUREUS MIXED ANAEROBIC FLORA PRESENT.  CALL LAB IF FURTHER IID REQUIRED. RESULT CALLED TO, READ BACK BY AND VERIFIED WITH: K BLUM,RN AT 1142 02/24/17 BY L BENFIELD CONCERNING RESISTANT ACINETOBACTER WILL REFER ISOLATE TO [facility] AND [facility] FOR ADDITIONAL SENSITIVITIES Performed at [facility], [place] [address], [place], [place] [address]    Report Status PENDING  Incomplete   Organism ID, Bacteria  PROTEUS MIRABILIS  Final   Organism ID, Bacteria METHICILLIN RESISTANT STAPHYLOCOCCUS AUREUS  Final   Organism ID, Bacteria ACINETOBACTER CALCOACETICUS/BAUMANNII COMPLEX  Final      Susceptibility   Acinetobacter calcoaceticus/baumannii complex - MIC*    CEFTAZIDIME >=64 RESISTANT Resistant     CEFTRIAXONE >=64 RESISTANT Resistant     CIPROFLOXACIN >=4 RESISTANT Resistant     GENTAMICIN >=16 RESISTANT Resistant  IMIPENEM >=16 RESISTANT Resistant     PIP/TAZO >=128 RESISTANT Resistant     TRIMETH/SULFA >=320 RESISTANT Resistant     CEFEPIME >=64 RESISTANT Resistant     AMPICILLIN/SULBACTAM RESISTANT Resistant     LEVOFLOXACIN Value in next row Intermediate      INTERMEDIATE4    * ABUNDANT ACINETOBACTER CALCOACETICUS/BAUMANNII COMPLEX   Methicillin resistant staphylococcus aureus - MIC*    CIPROFLOXACIN Value in next row Resistant      INTERMEDIATE4    ERYTHROMYCIN Value in next row Sensitive      INTERMEDIATE4    GENTAMICIN Value in next row Sensitive      INTERMEDIATE4    OXACILLIN Value in next row Resistant      INTERMEDIATE4    TETRACYCLINE Value in next row Sensitive      INTERMEDIATE4    VANCOMYCIN Value in next row Sensitive      INTERMEDIATE4    TRIMETH/SULFA Value in next row Sensitive      INTERMEDIATE4    CLINDAMYCIN Value in next row Sensitive      INTERMEDIATE4    RIFAMPIN Value in next row Sensitive      INTERMEDIATE4    Inducible Clindamycin Value in next row Sensitive      INTERMEDIATE4    * MODERATE METHICILLIN RESISTANT STAPHYLOCOCCUS AUREUS   Proteus mirabilis - MIC*    AMPICILLIN Value in next row Sensitive      INTERMEDIATE4    CEFAZOLIN Value in next row Sensitive      INTERMEDIATE4    CEFEPIME Value in next row Sensitive      INTERMEDIATE4    CEFTAZIDIME Value in next row Sensitive      INTERMEDIATE4    CEFTRIAXONE Value in next row Sensitive      INTERMEDIATE4    CIPROFLOXACIN Value in next row Sensitive      INTERMEDIATE4    GENTAMICIN  Value in next row Sensitive      INTERMEDIATE4    IMIPENEM Value in next row Sensitive      INTERMEDIATE4    TRIMETH/SULFA Value in next row Sensitive      INTERMEDIATE4    AMPICILLIN/SULBACTAM Value in next row Sensitive      INTERMEDIATE4    PIP/TAZO Value in next row Sensitive      INTERMEDIATE4    * ABUNDANT PROTEUS MIRABILIS  Aerobic/Anaerobic Culture (surgical/deep wound)     Status: None   Collection Time: 02/18/17 12:14 PM  Result Value Ref Range Status   Specimen Description FLUID SACRAL ULCER  Final   Special Requests NONE  Final   Gram Stain   Final    RARE WBC PRESENT,BOTH PMN AND MONONUCLEAR MODERATE GRAM POSITIVE COCCI MODERATE GRAM POSITIVE RODS Performed at [facility], [address], [place], [place] [address]    Culture   Final    MODERATE PROTEUS MIRABILIS FEW METHICILLIN RESISTANT STAPHYLOCOCCUS AUREUS MIXED ANAEROBIC FLORA PRESENT.  CALL LAB IF FURTHER IID REQUIRED.    Report Status 02/23/2017 FINAL  Final   Organism ID, Bacteria PROTEUS MIRABILIS  Final   Organism ID, Bacteria METHICILLIN RESISTANT STAPHYLOCOCCUS AUREUS  Final      Susceptibility   Methicillin resistant staphylococcus aureus - MIC*    CIPROFLOXACIN >=8 RESISTANT Resistant     ERYTHROMYCIN 0.5 SENSITIVE Sensitive     GENTAMICIN <=0.5 SENSITIVE Sensitive     OXACILLIN >=4 RESISTANT Resistant     TETRACYCLINE <=1 SENSITIVE Sensitive     VANCOMYCIN 1  SENSITIVE Sensitive     TRIMETH/SULFA <=10 SENSITIVE Sensitive     CLINDAMYCIN <=0.25 SENSITIVE Sensitive     RIFAMPIN <=0.5 SENSITIVE Sensitive     Inducible Clindamycin NEGATIVE Sensitive     * FEW METHICILLIN RESISTANT STAPHYLOCOCCUS AUREUS   Proteus mirabilis - MIC*    AMPICILLIN <=2 SENSITIVE Sensitive     CEFAZOLIN <=4 SENSITIVE Sensitive     CEFEPIME <=1 SENSITIVE Sensitive     CEFTAZIDIME <=1 SENSITIVE Sensitive     CEFTRIAXONE <=1 SENSITIVE Sensitive     CIPROFLOXACIN <=0.25 SENSITIVE Sensitive     GENTAMICIN <=1  SENSITIVE Sensitive     IMIPENEM 2 SENSITIVE Sensitive     TRIMETH/SULFA <=20 SENSITIVE Sensitive     AMPICILLIN/SULBACTAM <=2 SENSITIVE Sensitive     PIP/TAZO <=4 SENSITIVE Sensitive     * MODERATE PROTEUS MIRABILIS    Studies/Results: Ir Fluoro Guide Cv Line Left  Result Date: 02/25/2017 INDICATION: Poor venous access. In need of durable intravenous access for antibiotic administration. EXAM: ULTRASOUND AND FLUOROSCOPIC GUIDED PICC LINE INSERTION MEDICATIONS: None. CONTRAST:  None FLUOROSCOPY TIME:  3 minutes, 36 seconds (9.1 mGy) COMPLICATIONS: None immediate. TECHNIQUE: The procedure, risks, benefits, and alternatives were explained to the patient and informed written consent was obtained. A timeout was performed prior to the initiation of the procedure. The left upper extremity was prepped with chlorhexidine in a sterile fashion, and a sterile drape was applied covering the operative field. Maximum barrier sterile technique with sterile gowns and gloves were used for the procedure. A timeout was performed prior to the initiation of the procedure. Local anesthesia was provided with 1% lidocaine. Under direct ultrasound guidance, the brachial vein was accessed with a micropuncture kit after the overlying soft tissues were anesthetized with 1% lidocaine. After the overlying soft tissues were anesthetized, a small venotomy incision was created and a micropuncture kit was utilized to access the left brachial vein. Real-time ultrasound guidance was utilized for vascular access including the acquisition of a permanent ultrasound image documenting patency of the accessed vessel. A guidewire was advanced to the level of the superior caval-atrial junction for measurement purposes and the PICC line was cut to length. A peel-away sheath was placed and a 34 cm, 5 [place], dual lumen was inserted to level of the superior caval-atrial junction, however no was made of difficulty advancing the PICC line beyond the  level of the left axilla, likely secondary to a stenosis at this location. A post procedure spot fluoroscopic was obtained. The catheter easily aspirated and flushed and was sutured in place. A dressing was placed. The patient tolerated the procedure well without immediate post procedural complication. FINDINGS: After catheter placement, the tip lies within the superior cavoatrial junction. The catheter aspirates and flushes normally and is ready for immediate use. IMPRESSION: Successful ultrasound and fluoroscopic guided placement of a left brachial vein approach, 34 cm, 5 [place], dual lumen PICC with tip at the superior caval-atrial junction. The PICC line is ready for immediate use. Would consider right upper extremity approach PICC placement during future attempted PICC lines given suspected stenosis at the level of the left axilla. Electronically Signed   By: Sandi Mariscal M.D.   On: 02/25/2017 09:50   Ir US Guide Vasc Access Left  Result Date: 02/25/2017 INDICATION: Poor venous access. In need of durable intravenous access for antibiotic administration. EXAM: ULTRASOUND AND FLUOROSCOPIC GUIDED PICC LINE INSERTION MEDICATIONS: None. CONTRAST:  None FLUOROSCOPY TIME:  3 minutes, 36 seconds (9.1 mGy) COMPLICATIONS: None  immediate. TECHNIQUE: The procedure, risks, benefits, and alternatives were explained to the patient and informed written consent was obtained. A timeout was performed prior to the initiation of the procedure. The left upper extremity was prepped with chlorhexidine in a sterile fashion, and a sterile drape was applied covering the operative field. Maximum barrier sterile technique with sterile gowns and gloves were used for the procedure. A timeout was performed prior to the initiation of the procedure. Local anesthesia was provided with 1% lidocaine. Under direct ultrasound guidance, the brachial vein was accessed with a micropuncture kit after the overlying soft tissues were anesthetized with  1% lidocaine. After the overlying soft tissues were anesthetized, a small venotomy incision was created and a micropuncture kit was utilized to access the left brachial vein. Real-time ultrasound guidance was utilized for vascular access including the acquisition of a permanent ultrasound image documenting patency of the accessed vessel. A guidewire was advanced to the level of the superior caval-atrial junction for measurement purposes and the PICC line was cut to length. A peel-away sheath was placed and a 34 cm, 5 [place], dual lumen was inserted to level of the superior caval-atrial junction, however no was made of difficulty advancing the PICC line beyond the level of the left axilla, likely secondary to a stenosis at this location. A post procedure spot fluoroscopic was obtained. The catheter easily aspirated and flushed and was sutured in place. A dressing was placed. The patient tolerated the procedure well without immediate post procedural complication. FINDINGS: After catheter placement, the tip lies within the superior cavoatrial junction. The catheter aspirates and flushes normally and is ready for immediate use. IMPRESSION: Successful ultrasound and fluoroscopic guided placement of a left brachial vein approach, 34 cm, 5 French, dual lumen PICC with tip at the superior caval-atrial junction. The PICC line is ready for immediate use. Would consider right upper extremity approach PICC placement during future attempted PICC lines given suspected stenosis at the level of the left axilla. Electronically Signed   By: Sandi Mariscal M.D.   On: 02/25/2017 09:50      Assessment/Plan:  INTERVAL HISTORY:  Hives while polymyxin infusing  Principal Problem:   Decubitus ulcer of left ischial area, stage III (HCC) Active Problems:   Paraplegia (HCC)   History of transverse myelitis   Decubitus ulcer   Osteomyelitis, pelvis (HCC)   Wound disruption   MRSA infection   Acinetobacter lwoffi infection    Proteus infection   Allergic reaction    [name] is a 36 y.o. 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 Excell Seltzer of skin, subcutaneous tissue and periosteum left ischial decubitus ulcer--> cultures which yieled Proteus, MDR Acinetobacter and MRSA  Cultures have yielded Proteus (fairly S), MRSA  (tetracycline S) and MDR Acinetobacter R to all antibiotics tested  The organism is being sent to   [facility] for : Benjiman Core, mino   And to [facility] for : Polymyxin, tygacil  She had hives yesterday with polymyxin infusion  Continue oral minocycline and will discuss with ID pharmacy ? IV levaquin really much of advantage over oral levaquin here or not  Continue daptomycin  I also put in call to Dr. Excell Seltzer who is in OR today to clarify if ANY of tissue sent for culture was from bone. Even if it  was not it wont remove anxiety for bone involvement with MDRO but it might lessen it and hold out hope that this organism was only in the soft tissues  I spent greater than 35 minutes with the patient including greater than 50% of time in face to face counsel of the patient re antimicrobial options and in coordination of her care with microbiology lab, ID pharmacy and General Surgery.     LOS: 10 days   Alcide Evener 02/26/2017, 3:03 PM

## 2017-02-26 NOTE — Progress Notes (Signed)
Nutrition Follow-up  INTERVENTION:   -Continue Boost Plus chocolate TID- Each supplement provides 360kcal and 14g protein.   -D/c MVI -RD will continue to monitor  NUTRITION DIAGNOSIS:   Increased nutrient needs related to wound healing as evidenced by estimated needs.  Ongoing.  GOAL:   Patient will meet greater than or equal to 90% of their needs  Progressing.  MONITOR:   PO intake, Supplement acceptance, Labs, Weight trends, Skin, I & O's  ASSESSMENT:   36 y.o. female with medical history significant of MS, tranverse myelitis  bedbound state with decub wound that has been healing well for the past year comes in after her mom noticing the wound has gotten bigger in the last day or so.  She only has wound care nurse come out to her home once a month.  No fevers.  No n/v/d.  Pt referred for admission for possible need of wound debriedement.  Patient is consuming 95-100% of meals over the past 2 days. Pt is drinking Boost Plus supplements. Pt not taking MVI, will discontinue.  Will monitor for further needs.  Labs reviewed. Medications: Senokot tablet BID  Diet Order:  Diet - low sodium heart healthy Diet regular Room service appropriate? Yes; Fluid consistency: Thin  EDUCATION NEEDS:   Education needs have been addressed  Skin:  Skin Assessment: Skin Integrity Issues: Skin Integrity Issues:: Stage II, Other (Comment) Stage II: sacrum Other: buttocks wound  Last BM:  11/15  Height:   Ht Readings from Last 1 Encounters:  02/14/17 5\' 3"  (1.6 m)    Weight:   Wt Readings from Last 1 Encounters:  02/14/17 131 lb 9.8 oz (59.7 kg)    Ideal Body Weight:  49.7 kg  BMI:  Body mass index is 23.31 kg/m.  Estimated Nutritional Needs:   Kcal:  1500-1700  Protein:  70-80g  Fluid:  1.7L/day  Clayton Bibles, MS, RD, LDN [place] Dietitian Pager: [phone] After Hours Pager: [phone]

## 2017-02-26 NOTE — Progress Notes (Signed)
[facility] PROGRESS NOTE    Progress Note  [name]  RXV:[id number] DOB: 1980-05-28 DOA: 02/13/2017 PCP: Lennie Odor, PA-C     Brief Narrative:   [name] is an 36 y.o. 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 Gen. surgery and ID was consulted, she is status post debridement on 02/18/2017. ID on board, wound culture grew Acinetobacter resistant to all antibiotic tested for and Proteus pansensitive and MRSA. Awaiting IDs recommendation on antibiotics. PICC line was placed on 02/25/2017. Abx changed to Daptomycin for MRSA, Levaquin for proteus and Minocycline for Acinetobacter. Cultures send for sensitivities to Polymyxin, tygacil and minocycline.  Multiple sclerosis and paraplegia: Wheelchair-bound continue supportive care.  Constipation: Continue bowel regimen.  Hypokalemia: Resolved.  Chronic microcytic anemia: Only to follow-up with primary care doctor as an outpatient. Hemoglobin has remained stable.  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  Possible allergic reaction to Polymyxin: ID on board and have change her abx.   DVT prophylaxis: lovenox Family Communication:none Disposition Plan/Barrier to D/C: unable to determine Code Status:     Code Status Orders  (From admission, onward)        Start     Ordered   02/14/17 0101  Full code  Continuous     02/14/17 0100    Code Status History    Date Active Date Inactive Code Status Order ID Comments User Context   11/15/2016 02:45 11/17/2016 18:48 Full Code [id number]  [name], MD Inpatient   07/17/2016 23:21 07/19/2016 20:47 Full Code [id number]  Ivor Costa, MD ED   02/15/2016 22:17 02/25/2016 20:01 Full Code [id number]   [name], MD Inpatient   01/07/2016 09:58 01/09/2016 14:24 Full Code [id number]  [name], MD Inpatient   10/29/2014 21:14 10/31/2014 17:11 Full Code [id number]  [name], MD Inpatient   09/27/2014 22:49 09/28/2014 20:46 Full Code [id number]  [name], DO ED   03/30/2013 12:15 04/02/2013 17:32 Full Code [id number]  [name], MD Inpatient   02/16/2012 01:46 02/18/2012 18:53 Full Code [id number]  [name]., DO ED        IV Access:    Peripheral IV   Procedures and diagnostic studies:   Ir Fluoro Guide Cv Line Left  Result Date: 02/25/2017 INDICATION: Poor venous access. In need of durable intravenous access for antibiotic administration. EXAM: ULTRASOUND AND FLUOROSCOPIC GUIDED PICC LINE INSERTION MEDICATIONS: None. CONTRAST:  None FLUOROSCOPY TIME:  3 minutes, 36 seconds (9.1 mGy) COMPLICATIONS: None immediate. TECHNIQUE: The procedure, risks, benefits, and alternatives were explained to the patient and informed written consent was obtained. A timeout was performed prior to the initiation of the procedure. The left upper extremity was prepped with chlorhexidine in a sterile fashion, and a sterile drape was applied covering the operative field. Maximum barrier sterile technique with sterile gowns and gloves were used for the procedure. A timeout was performed prior to the initiation of the procedure. Local anesthesia was provided with 1% lidocaine. Under direct ultrasound guidance, the brachial vein was accessed with a micropuncture kit after the overlying soft tissues were anesthetized with 1% lidocaine. After the overlying soft tissues were anesthetized, a small venotomy incision was created and a micropuncture kit was utilized to access the left brachial vein. Real-time ultrasound guidance was utilized for vascular  access including the acquisition of a permanent ultrasound image documenting patency of the accessed vessel. A guidewire was advanced to the  level of the superior caval-atrial junction for measurement purposes and the PICC line was cut to length. A peel-away sheath was placed and a 34 cm, 5 [place], dual lumen was inserted to level of the superior caval-atrial junction, however no was made of difficulty advancing the PICC line beyond the level of the left axilla, likely secondary to a stenosis at this location. A post procedure spot fluoroscopic was obtained. The catheter easily aspirated and flushed and was sutured in place. A dressing was placed. The patient tolerated the procedure well without immediate post procedural complication. FINDINGS: After catheter placement, the tip lies within the superior cavoatrial junction. The catheter aspirates and flushes normally and is ready for immediate use. IMPRESSION: Successful ultrasound and fluoroscopic guided placement of a left brachial vein approach, 34 cm, 5 French, dual lumen PICC with tip at the superior caval-atrial junction. The PICC line is ready for immediate use. Would consider right upper extremity approach PICC placement during future attempted PICC lines given suspected stenosis at the level of the left axilla. Electronically Signed   By: Sandi Mariscal M.D.   On: 02/25/2017 09:50   Ir US Guide Vasc Access Left  Result Date: 02/25/2017 INDICATION: Poor venous access. In need of durable intravenous access for antibiotic administration. EXAM: ULTRASOUND AND FLUOROSCOPIC GUIDED PICC LINE INSERTION MEDICATIONS: None. CONTRAST:  None FLUOROSCOPY TIME:  3 minutes, 36 seconds (9.1 mGy) COMPLICATIONS: None immediate. TECHNIQUE: The procedure, risks, benefits, and alternatives were explained to the patient and informed written consent was obtained. A timeout was performed prior to the initiation of the procedure. The left upper extremity was prepped with chlorhexidine in a sterile fashion, and a sterile drape was applied covering the operative field. Maximum barrier sterile technique with sterile gowns  and gloves were used for the procedure. A timeout was performed prior to the initiation of the procedure. Local anesthesia was provided with 1% lidocaine. Under direct ultrasound guidance, the brachial vein was accessed with a micropuncture kit after the overlying soft tissues were anesthetized with 1% lidocaine. After the overlying soft tissues were anesthetized, a small venotomy incision was created and a micropuncture kit was utilized to access the left brachial vein. Real-time ultrasound guidance was utilized for vascular access including the acquisition of a permanent ultrasound image documenting patency of the accessed vessel. A guidewire was advanced to the level of the superior caval-atrial junction for measurement purposes and the PICC line was cut to length. A peel-away sheath was placed and a 34 cm, 5 [place], dual lumen was inserted to level of the superior caval-atrial junction, however no was made of difficulty advancing the PICC line beyond the level of the left axilla, likely secondary to a stenosis at this location. A post procedure spot fluoroscopic was obtained. The catheter easily aspirated and flushed and was sutured in place. A dressing was placed. The patient tolerated the procedure well without immediate post procedural complication. FINDINGS: After catheter placement, the tip lies within the superior cavoatrial junction. The catheter aspirates and flushes normally and is ready for immediate use. IMPRESSION: Successful ultrasound and fluoroscopic guided placement of a left brachial vein approach, 34 cm, 5 French, dual lumen PICC with tip at the superior caval-atrial junction. The PICC line is ready for immediate use. Would consider right upper extremity approach PICC placement during future attempted PICC lines given suspected stenosis at the level  of the left axilla. Electronically Signed   By: Sandi Mariscal M.D.   On: 02/25/2017 09:50     Medical Consultants:     None.  Anti-Infectives:   Continue IV Daptomycin, proteus and Minocycline  Subjective:    Faylynn E Bayless no new complains.  Objective:    Vitals:   02/25/17 0605 02/25/17 1400 02/25/17 2126 02/26/17 0600  BP: (!) 96/52 115/72 (!) 103/57 129/67  Pulse: 89 86 88 79  Resp: 16 16 16 16   Temp: 99.1 F (37.3 C) 98.4 F (36.9 C) 99 F (37.2 C) 98.8 F (37.1 C)  TempSrc: Oral Oral Oral Oral  SpO2: 100% 100% 98% 97%  Weight:      Height:        Intake/Output Summary (Last 24 hours) at 02/26/2017 1000 Last data filed at 02/26/2017 4235 Gross per 24 hour  Intake 1264.33 ml  Output 1725 ml  Net -460.67 ml   Filed Weights   02/14/17 0057  Weight: 59.7 kg (131 lb 9.8 oz)    Exam: General exam: In no acute distress. Respiratory system: Good air movement and clear to auscultation. Cardiovascular system: S1 & S2 heard, RRR. No JVD, murmurs, rubs, gallops or clicks.  Gastrointestinal system: Abdomen is nondistended, soft and nontender.  Extremities: No pedal edema. Psychiatry: Judgement and insight appear normal. Mood & affect appropriate.    Data Reviewed:    Labs: Basic Metabolic Panel: No results for input(s): NA, K, CL, CO2, GLUCOSE, BUN, CREATININE, CALCIUM, MG, PHOS in the last 168 hours. GFR CrCl cannot be calculated (This lab value cannot be used to calculate CrCl because it is not a number: <0.30). Liver Function Tests: No results for input(s): AST, ALT, ALKPHOS, BILITOT, PROT, ALBUMIN in the last 168 hours. No results for input(s): LIPASE, AMYLASE in the last 168 hours. No results for input(s): AMMONIA in the last 168 hours. Coagulation profile No results for input(s): INR, PROTIME in the last 168 hours.  CBC: Recent Labs  Lab 02/20/17 0557 02/22/17 0353  WBC 4.3 5.8  HGB 7.7* 8.0*  HCT 26.8* 27.1*  MCV 74.0* 72.5*  PLT 196 242   Cardiac Enzymes: Recent Labs  Lab 02/25/17 0529  CKTOTAL 15*   BNP (last 3 results) No results for  input(s): PROBNP in the last 8760 hours. CBG: No results for input(s): GLUCAP in the last 168 hours. D-Dimer: No results for input(s): DDIMER in the last 72 hours. Hgb A1c: No results for input(s): HGBA1C in the last 72 hours. Lipid Profile: No results for input(s): CHOL, HDL, LDLCALC, TRIG, CHOLHDL, LDLDIRECT in the last 72 hours. Thyroid function studies: No results for input(s): TSH, T4TOTAL, T3FREE, THYROIDAB in the last 72 hours.  Invalid input(s): FREET3 Anemia work up: No results for input(s): VITAMINB12, FOLATE, FERRITIN, TIBC, IRON, RETICCTPCT in the last 72 hours. Sepsis Labs: Recent Labs  Lab 02/20/17 0557 02/22/17 0353  WBC 4.3 5.8   Microbiology Recent Results (from the past 240 hour(s))  Aerobic/Anaerobic Culture (surgical/deep wound)     Status: None (Preliminary result)   Collection Time: 02/18/17 12:02 PM  Result Value Ref Range Status   Specimen Description TISSUE SOFT LT SACRAL ULCER  Final   Special Requests TISSUE SENT INSIDE SWABS  Final   Gram Stain   Final    FEW WBC PRESENT,BOTH PMN AND MONONUCLEAR ABUNDANT GRAM POSITIVE COCCI ABUNDANT GRAM NEGATIVE RODS ABUNDANT GRAM POSITIVE RODS    Culture   Final    ABUNDANT PROTEUS MIRABILIS ABUNDANT  ACINETOBACTER CALCOACETICUS/BAUMANNII COMPLEX MULTI-DRUG RESISTANT ORGANISM MODERATE METHICILLIN RESISTANT STAPHYLOCOCCUS AUREUS MIXED ANAEROBIC FLORA PRESENT.  CALL LAB IF FURTHER IID REQUIRED. RESULT CALLED TO, READ BACK BY AND VERIFIED WITH: K BLUM,RN AT 1142 02/24/17 BY L BENFIELD CONCERNING RESISTANT ACINETOBACTER WILL REFER ISOLATE TO [facility] AND [facility] FOR ADDITIONAL SENSITIVITIES Performed at [facility], [place] [address] [place], [place] [address]    Report Status PENDING  Incomplete   Organism ID, Bacteria PROTEUS MIRABILIS  Final   Organism ID, Bacteria METHICILLIN RESISTANT STAPHYLOCOCCUS AUREUS  Final   Organism ID, Bacteria ACINETOBACTER CALCOACETICUS/BAUMANNII COMPLEX  Final       Susceptibility   Acinetobacter calcoaceticus/baumannii complex - MIC*    CEFTAZIDIME >=64 RESISTANT Resistant     CEFTRIAXONE >=64 RESISTANT Resistant     CIPROFLOXACIN >=4 RESISTANT Resistant     GENTAMICIN >=16 RESISTANT Resistant     IMIPENEM >=16 RESISTANT Resistant     PIP/TAZO >=128 RESISTANT Resistant     TRIMETH/SULFA >=320 RESISTANT Resistant     CEFEPIME >=64 RESISTANT Resistant     AMPICILLIN/SULBACTAM RESISTANT Resistant     LEVOFLOXACIN Value in next row Intermediate      INTERMEDIATE4    * ABUNDANT ACINETOBACTER CALCOACETICUS/BAUMANNII COMPLEX   Methicillin resistant staphylococcus aureus - MIC*    CIPROFLOXACIN Value in next row Resistant      INTERMEDIATE4    ERYTHROMYCIN Value in next row Sensitive      INTERMEDIATE4    GENTAMICIN Value in next row Sensitive      INTERMEDIATE4    OXACILLIN Value in next row Resistant      INTERMEDIATE4    TETRACYCLINE Value in next row Sensitive      INTERMEDIATE4    VANCOMYCIN Value in next row Sensitive      INTERMEDIATE4    TRIMETH/SULFA Value in next row Sensitive      INTERMEDIATE4    CLINDAMYCIN Value in next row Sensitive      INTERMEDIATE4    RIFAMPIN Value in next row Sensitive      INTERMEDIATE4    Inducible Clindamycin Value in next row Sensitive      INTERMEDIATE4    * MODERATE METHICILLIN RESISTANT STAPHYLOCOCCUS AUREUS   Proteus mirabilis - MIC*    AMPICILLIN Value in next row Sensitive      INTERMEDIATE4    CEFAZOLIN Value in next row Sensitive      INTERMEDIATE4    CEFEPIME Value in next row Sensitive      INTERMEDIATE4    CEFTAZIDIME Value in next row Sensitive      INTERMEDIATE4    CEFTRIAXONE Value in next row Sensitive      INTERMEDIATE4    CIPROFLOXACIN Value in next row Sensitive      INTERMEDIATE4    GENTAMICIN Value in next row Sensitive      INTERMEDIATE4    IMIPENEM Value in next row Sensitive      INTERMEDIATE4    TRIMETH/SULFA Value in next row Sensitive      INTERMEDIATE4     AMPICILLIN/SULBACTAM Value in next row Sensitive      INTERMEDIATE4    PIP/TAZO Value in next row Sensitive      INTERMEDIATE4    * ABUNDANT PROTEUS MIRABILIS  Aerobic/Anaerobic Culture (surgical/deep wound)     Status: None   Collection Time: 02/18/17 12:14 PM  Result Value Ref Range Status   Specimen Description FLUID SACRAL ULCER  Final   Special Requests NONE  Final  Gram Stain   Final    RARE WBC PRESENT,BOTH PMN AND MONONUCLEAR MODERATE GRAM POSITIVE COCCI MODERATE GRAM POSITIVE RODS Performed at [facility], [place] [address], [place], [place] [address]    Culture   Final    MODERATE PROTEUS MIRABILIS FEW METHICILLIN RESISTANT STAPHYLOCOCCUS AUREUS MIXED ANAEROBIC FLORA PRESENT.  CALL LAB IF FURTHER IID REQUIRED.    Report Status 02/23/2017 FINAL  Final   Organism ID, Bacteria PROTEUS MIRABILIS  Final   Organism ID, Bacteria METHICILLIN RESISTANT STAPHYLOCOCCUS AUREUS  Final      Susceptibility   Methicillin resistant staphylococcus aureus - MIC*    CIPROFLOXACIN >=8 RESISTANT Resistant     ERYTHROMYCIN 0.5 SENSITIVE Sensitive     GENTAMICIN <=0.5 SENSITIVE Sensitive     OXACILLIN >=4 RESISTANT Resistant     TETRACYCLINE <=1 SENSITIVE Sensitive     VANCOMYCIN 1 SENSITIVE Sensitive     TRIMETH/SULFA <=10 SENSITIVE Sensitive     CLINDAMYCIN <=0.25 SENSITIVE Sensitive     RIFAMPIN <=0.5 SENSITIVE Sensitive     Inducible Clindamycin NEGATIVE Sensitive     * FEW METHICILLIN RESISTANT STAPHYLOCOCCUS AUREUS   Proteus mirabilis - MIC*    AMPICILLIN <=2 SENSITIVE Sensitive     CEFAZOLIN <=4 SENSITIVE Sensitive     CEFEPIME <=1 SENSITIVE Sensitive     CEFTAZIDIME <=1 SENSITIVE Sensitive     CEFTRIAXONE <=1 SENSITIVE Sensitive     CIPROFLOXACIN <=0.25 SENSITIVE Sensitive     GENTAMICIN <=1 SENSITIVE Sensitive     IMIPENEM 2 SENSITIVE Sensitive     TRIMETH/SULFA <=20 SENSITIVE Sensitive     AMPICILLIN/SULBACTAM <=2 SENSITIVE Sensitive     PIP/TAZO <=4 SENSITIVE  Sensitive     * MODERATE PROTEUS MIRABILIS     Medications:   . baclofen  20 mg Oral TID  . bisacodyl  10 mg Rectal Daily  . enoxaparin (LOVENOX) injection  40 mg Subcutaneous Q24H  . gabapentin  800 mg Oral TID  . lactose free nutrition  237 mL Oral TID WC  . lamoTRIgine  125 mg Oral Daily  . lubiprostone  24 mcg Oral Q breakfast  . minocycline  100 mg Oral BID  . multivitamin  15 mL Oral Daily  . pantoprazole  20 mg Oral Daily  . senna-docusate  1 tablet Oral BID  . sertraline  100 mg Oral Daily   Continuous Infusions: . 0.9 % NaCl with KCl 20 mEq / L 10 mL/hr at 02/25/17 2309  . DAPTOmycin (CUBICIN)  IV Stopped (02/25/17 8333)  . levofloxacin (LEVAQUIN) IV Stopped (02/25/17 2257)     LOS: 10 days   Charlynne Cousins  [facility] Pager [phone]  *Please refer to [email or web address], password [email or web address] to get updated schedule on who will round on this patient, as hospitalists switch teams weekly. If 7PM-7AM, please contact night-coverage at [email or web address], password [email or web address] for any overnight needs.  02/26/2017, 10:00 AM

## 2017-02-27 NOTE — Progress Notes (Signed)
PROGRESS NOTE    [name]  LKG:[id number] DOB: 05-11-1980 DOA: 02/13/2017 PCP: Lennie Odor, PA-C   Brief Narrative: 36 year old female with history of transverse myelitis, paraplegic bedbound, chronic sacral decub with a ulcer who comes in with foul-smelling discharge consistent with osteomyelitis.  Patient underwent surgical debridement and cultures growing resistant bacteria.  On antibiotics by infectious disease.  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  Underwent surgical debridement on November 19.  Cultures growing Acinetobacter resistant to all bacteria, Proteus pansensitive and MRSA.  Patient is currently on Levaquin, minocycline, daptomycin.  As per note from Dr. Tommy Medal from 11/27, evaluation ongoing for long-term antibiotics plan including MDR is Enterobacter resistant to all antibiotics tested, the organism is being sent to [facility] for further evaluation. -Continue supportive care. -Patient has PICC line.  #History of multiple sclerosis and transverse myelitis, paraplegia: Continue supportive care.  Continue Neurontin, baclofen, Lamictal  #Possible allergic reaction to polymyxin.  Currently off antibiotics.  #Depression: Continue Zoloft.  Mood is stable.  Plan discussed with the patient and her mother at bedside.  DVT prophylaxis: Lovenox subcutaneous Code Status: Full code Family Communication: Patient's mother at bedside Disposition Plan: Currently admitted    Consultants:   Surgery  Infectious disease  Procedures: Surgical debridement of sacral decubiti Antimicrobials: IV daptomycin, minocycline and Levaquin  Subjective: Seen and examined at bedside.  Denies headache, dizziness, nausea vomiting chest pain shortness of breath.  Objective: Vitals:   02/26/17 0600 02/26/17 1329 02/26/17 2048 02/27/17 0609  BP: 129/67 131/64 112/62 (!) 94/54  Pulse: 79 77 (!) 109 92    Resp: 16 16 18 16   Temp: 98.8 F (37.1 C) 98.4 F (36.9 C) 99.1 F (37.3 C) 98.5 F (36.9 C)  TempSrc: Oral Oral Oral Oral  SpO2: 97% 98% 99% 100%  Weight:      Height:        Intake/Output Summary (Last 24 hours) at 02/27/2017 1307 Last data filed at 02/27/2017 1011 Gross per 24 hour  Intake 664.4 ml  Output 926 ml  Net -261.6 ml   Filed Weights   02/14/17 0057  Weight: 59.7 kg (131 lb 9.8 oz)    Examination:  General exam: Appears calm and comfortable  Respiratory system: Clear to auscultation. Respiratory effort normal. No wheezing or crackle Cardiovascular system: S1 & S2 heard, RRR.  No pedal edema. Gastrointestinal system: Abdomen is nondistended, soft and nontender. Normal bowel sounds heard. Central nervous system: Alert and oriented.  Extremities: Spastic lower extremities Psychiatry: Judgement and insight appear normal. Mood & affect appropriate.     Data Reviewed: I have personally reviewed following labs and imaging studies  CBC: Recent Labs  Lab 02/22/17 0353  WBC 5.8  HGB 8.0*  HCT 27.1*  MCV 72.5*  PLT 664   Basic Metabolic Panel: No results for input(s): NA, K, CL, CO2, GLUCOSE, BUN, CREATININE, CALCIUM, MG, PHOS in the last 168 hours. GFR: CrCl cannot be calculated (This lab value cannot be used to calculate CrCl because it is not a number: <0.30). Liver Function Tests: No results for input(s): AST, ALT, ALKPHOS, BILITOT, PROT, ALBUMIN in the last 168 hours. No results for input(s): LIPASE, AMYLASE in the last 168 hours. No results for input(s): AMMONIA in the last 168 hours. Coagulation Profile: No results for input(s): INR, PROTIME in the last 168 hours. Cardiac Enzymes: Recent Labs  Lab 02/25/17 0529  CKTOTAL 15*   BNP (last  3 results) No results for input(s): PROBNP in the last 8760 hours. HbA1C: No results for input(s): HGBA1C in the last 72 hours. CBG: No results for input(s): GLUCAP in the last 168 hours. Lipid  Profile: No results for input(s): CHOL, HDL, LDLCALC, TRIG, CHOLHDL, LDLDIRECT in the last 72 hours. Thyroid Function Tests: No results for input(s): TSH, T4TOTAL, FREET4, T3FREE, THYROIDAB in the last 72 hours. Anemia Panel: No results for input(s): VITAMINB12, FOLATE, FERRITIN, TIBC, IRON, RETICCTPCT in the last 72 hours. Sepsis Labs: No results for input(s): PROCALCITON, LATICACIDVEN in the last 168 hours.  Recent Results (from the past 240 hour(s))  Aerobic/Anaerobic Culture (surgical/deep wound)     Status: None (Preliminary result)   Collection Time: 02/18/17 12:02 PM  Result Value Ref Range Status   Specimen Description TISSUE SOFT LT SACRAL ULCER  Final   Special Requests TISSUE SENT INSIDE SWABS  Final   Gram Stain   Final    FEW WBC PRESENT,BOTH PMN AND MONONUCLEAR ABUNDANT GRAM POSITIVE COCCI ABUNDANT GRAM NEGATIVE RODS ABUNDANT GRAM POSITIVE RODS    Culture   Final    ABUNDANT PROTEUS MIRABILIS ABUNDANT ACINETOBACTER CALCOACETICUS/BAUMANNII COMPLEX MULTI-DRUG RESISTANT ORGANISM MODERATE METHICILLIN RESISTANT STAPHYLOCOCCUS AUREUS MIXED ANAEROBIC FLORA PRESENT.  CALL LAB IF FURTHER IID REQUIRED. RESULT CALLED TO, READ BACK BY AND VERIFIED WITH: K BLUM,RN AT 1142 02/24/17 BY L BENFIELD CONCERNING RESISTANT ACINETOBACTER WILL REFER ISOLATE TO [facility] AND [facility] FOR ADDITIONAL SENSITIVITIES Performed at [facility], [place] [address], [place], [place] [address]    Report Status PENDING  Incomplete   Organism ID, Bacteria PROTEUS MIRABILIS  Final   Organism ID, Bacteria METHICILLIN RESISTANT STAPHYLOCOCCUS AUREUS  Final   Organism ID, Bacteria ACINETOBACTER CALCOACETICUS/BAUMANNII COMPLEX  Final      Susceptibility   Acinetobacter calcoaceticus/baumannii complex - MIC*    CEFTAZIDIME >=64 RESISTANT Resistant     CEFTRIAXONE >=64 RESISTANT Resistant     CIPROFLOXACIN >=4 RESISTANT Resistant     GENTAMICIN >=16 RESISTANT Resistant     IMIPENEM >=16 RESISTANT  Resistant     PIP/TAZO >=128 RESISTANT Resistant     TRIMETH/SULFA >=320 RESISTANT Resistant     CEFEPIME >=64 RESISTANT Resistant     AMPICILLIN/SULBACTAM RESISTANT Resistant     LEVOFLOXACIN Value in next row Intermediate      INTERMEDIATE4    * ABUNDANT ACINETOBACTER CALCOACETICUS/BAUMANNII COMPLEX   Methicillin resistant staphylococcus aureus - MIC*    CIPROFLOXACIN Value in next row Resistant      INTERMEDIATE4    ERYTHROMYCIN Value in next row Sensitive      INTERMEDIATE4    GENTAMICIN Value in next row Sensitive      INTERMEDIATE4    OXACILLIN Value in next row Resistant      INTERMEDIATE4    TETRACYCLINE Value in next row Sensitive      INTERMEDIATE4    VANCOMYCIN Value in next row Sensitive      INTERMEDIATE4    TRIMETH/SULFA Value in next row Sensitive      INTERMEDIATE4    CLINDAMYCIN Value in next row Sensitive      INTERMEDIATE4    RIFAMPIN Value in next row Sensitive      INTERMEDIATE4    Inducible Clindamycin Value in next row Sensitive      INTERMEDIATE4    * MODERATE METHICILLIN RESISTANT STAPHYLOCOCCUS AUREUS   Proteus mirabilis - MIC*    AMPICILLIN Value in next row Sensitive      INTERMEDIATE4  CEFAZOLIN Value in next row Sensitive      INTERMEDIATE4    CEFEPIME Value in next row Sensitive      INTERMEDIATE4    CEFTAZIDIME Value in next row Sensitive      INTERMEDIATE4    CEFTRIAXONE Value in next row Sensitive      INTERMEDIATE4    CIPROFLOXACIN Value in next row Sensitive      INTERMEDIATE4    GENTAMICIN Value in next row Sensitive      INTERMEDIATE4    IMIPENEM Value in next row Sensitive      INTERMEDIATE4    TRIMETH/SULFA Value in next row Sensitive      INTERMEDIATE4    AMPICILLIN/SULBACTAM Value in next row Sensitive      INTERMEDIATE4    PIP/TAZO Value in next row Sensitive      INTERMEDIATE4    * ABUNDANT PROTEUS MIRABILIS  Aerobic/Anaerobic Culture (surgical/deep wound)     Status: None   Collection Time: 02/18/17 12:14 PM   Result Value Ref Range Status   Specimen Description FLUID SACRAL ULCER  Final   Special Requests NONE  Final   Gram Stain   Final    RARE WBC PRESENT,BOTH PMN AND MONONUCLEAR MODERATE GRAM POSITIVE COCCI MODERATE GRAM POSITIVE RODS Performed at [facility], [address], [place], [place] [address]    Culture   Final    MODERATE PROTEUS MIRABILIS FEW METHICILLIN RESISTANT STAPHYLOCOCCUS AUREUS MIXED ANAEROBIC FLORA PRESENT.  CALL LAB IF FURTHER IID REQUIRED.    Report Status 02/23/2017 FINAL  Final   Organism ID, Bacteria PROTEUS MIRABILIS  Final   Organism ID, Bacteria METHICILLIN RESISTANT STAPHYLOCOCCUS AUREUS  Final      Susceptibility   Methicillin resistant staphylococcus aureus - MIC*    CIPROFLOXACIN >=8 RESISTANT Resistant     ERYTHROMYCIN 0.5 SENSITIVE Sensitive     GENTAMICIN <=0.5 SENSITIVE Sensitive     OXACILLIN >=4 RESISTANT Resistant     TETRACYCLINE <=1 SENSITIVE Sensitive     VANCOMYCIN 1 SENSITIVE Sensitive     TRIMETH/SULFA <=10 SENSITIVE Sensitive     CLINDAMYCIN <=0.25 SENSITIVE Sensitive     RIFAMPIN <=0.5 SENSITIVE Sensitive     Inducible Clindamycin NEGATIVE Sensitive     * FEW METHICILLIN RESISTANT STAPHYLOCOCCUS AUREUS   Proteus mirabilis - MIC*    AMPICILLIN <=2 SENSITIVE Sensitive     CEFAZOLIN <=4 SENSITIVE Sensitive     CEFEPIME <=1 SENSITIVE Sensitive     CEFTAZIDIME <=1 SENSITIVE Sensitive     CEFTRIAXONE <=1 SENSITIVE Sensitive     CIPROFLOXACIN <=0.25 SENSITIVE Sensitive     GENTAMICIN <=1 SENSITIVE Sensitive     IMIPENEM 2 SENSITIVE Sensitive     TRIMETH/SULFA <=20 SENSITIVE Sensitive     AMPICILLIN/SULBACTAM <=2 SENSITIVE Sensitive     PIP/TAZO <=4 SENSITIVE Sensitive     * MODERATE PROTEUS MIRABILIS         Radiology Studies: No results found.      Scheduled Meds: . baclofen  20 mg Oral TID  . bisacodyl  10 mg Rectal Daily  . enoxaparin (LOVENOX) injection  40 mg Subcutaneous Q24H  . gabapentin  800 mg Oral  TID  . lactose free nutrition  237 mL Oral TID WC  . lamoTRIgine  125 mg Oral Daily  . levofloxacin  750 mg Oral Daily  . lubiprostone  24 mcg Oral Q breakfast  . minocycline  100 mg Oral BID  . pantoprazole  20 mg Oral Daily  .  senna-docusate  1 tablet Oral BID  . sertraline  100 mg Oral Daily   Continuous Infusions: . 0.9 % NaCl with KCl 20 mEq / L 10 mL/hr at 02/25/17 2309  . DAPTOmycin (CUBICIN)  IV Stopped (02/26/17 1922)     LOS: 11 days    Ashyia Schraeder Tanna Furry, MD [facility] Pager [phone]  If 7PM-7AM, please contact night-coverage [email or web address] Password [email or web address] 02/27/2017, 1:07 PM

## 2017-02-27 NOTE — Progress Notes (Signed)
Subjective:  No new complaints today  Antibiotics:  Anti-infectives (From admission, onward)   Start     Dose/Rate Route Frequency Ordered Stop   02/26/17 1700  levofloxacin (LEVAQUIN) tablet 750 mg     750 mg Oral Daily 02/26/17 1520     02/25/17 2330  polymyxin B 89.55 mg in dextrose 5 % 500 mL IVPB  Status:  Discontinued     1.5 mg/kg  59.7 kg 500 mL/hr over 1 Hours Intravenous Every 12 hours 02/25/17 1127 02/25/17 1442   02/25/17 1800  levofloxacin (LEVAQUIN) IVPB 750 mg  Status:  Discontinued     750 mg 100 mL/hr over 90 Minutes Intravenous Every 24 hours 02/25/17 1122 02/26/17 1520   02/25/17 1600  minocycline (MINOCIN,DYNACIN) capsule 100 mg  Status:  Discontinued     100 mg Oral 2 times daily 02/25/17 1442 02/27/17 1930   02/25/17 1230  polymyxin B 149.25 mg in dextrose 5 % 500 mL IVPB  Status:  Discontinued     2.5 mg/kg  59.7 kg 250 mL/hr over 2 Hours Intravenous  Once 02/25/17 1127 02/25/17 1442   02/25/17 1000  cefTAZidime (FORTAZ) 2 g in dextrose 5 % 50 mL IVPB  Status:  Discontinued     2 g 100 mL/hr over 30 Minutes Intravenous Every 8 hours 02/25/17 0942 02/25/17 1122   02/20/17 1600  meropenem (MERREM) 1 g in sodium chloride 0.9 % 100 mL IVPB  Status:  Discontinued     1 g 200 mL/hr over 30 Minutes Intravenous Every 8 hours 02/20/17 1456 02/25/17 0942   02/19/17 2200  metroNIDAZOLE (FLAGYL) tablet 500 mg  Status:  Discontinued     500 mg Oral Every 8 hours 02/19/17 2043 02/20/17 1430   02/18/17 1800  cefTAZidime (FORTAZ) 2 g in dextrose 5 % 50 mL IVPB  Status:  Discontinued     2 g 100 mL/hr over 30 Minutes Intravenous Every 8 hours 02/18/17 1631 02/20/17 1454   02/18/17 1800  DAPTOmycin (CUBICIN) 360 mg in sodium chloride 0.9 % IVPB     360 mg 214.4 mL/hr over 30 Minutes Intravenous Every 24 hours 02/18/17 1633        Medications: Scheduled Meds: . baclofen  20 mg Oral TID  . bisacodyl  10 mg Rectal Daily  . enoxaparin (LOVENOX) injection  40  mg Subcutaneous Q24H  . gabapentin  800 mg Oral TID  . lactose free nutrition  237 mL Oral TID WC  . lamoTRIgine  125 mg Oral Daily  . levofloxacin  750 mg Oral Daily  . lubiprostone  24 mcg Oral Q breakfast  . pantoprazole  20 mg Oral Daily  . senna-docusate  1 tablet Oral BID  . sertraline  100 mg Oral Daily   Continuous Infusions: . 0.9 % NaCl with KCl 20 mEq / L 10 mL/hr at 02/25/17 2309  . DAPTOmycin (CUBICIN)  IV 360 mg (02/27/17 1932)   PRN Meds:.diazepam, diphenhydrAMINE, fluticasone, ibuprofen, ondansetron (ZOFRAN) IV, sodium chloride flush    Objective: Weight change:   Intake/Output Summary (Last 24 hours) at 02/27/2017 2007 Last data filed at 02/27/2017 1500 Gross per 24 hour  Intake 704.4 ml  Output 1276 ml  Net -571.6 ml   Blood pressure (!) 112/54, pulse 90, temperature 97.9 F (36.6 C), temperature source Oral, resp. rate 16, height 5\' 3"  (1.6 m), weight 134 lb (60.8 kg), SpO2 100 %. Temp:  [97.9 F (36.6 C)-99.1 F (37.3 C)]  97.9 F (36.6 C) (11/28 1322) Pulse Rate:  [90-109] 90 (11/28 1322) Resp:  [16-18] 16 (11/28 1322) BP: (94-112)/(54-62) 112/54 (11/28 1322) SpO2:  [99 %-100 %] 100 % (11/28 1322) Weight:  [134 lb (60.8 kg)] 134 lb (60.8 kg) (11/28 1900)  Physical Exam: General: Alert and awake, oriented x3, not in any acute distress. CV: rr Pulm; no wheezes, resp distress Neuro: no new focal deficits  Skin: Hives resolved see picture from yesterday with  hives on arm where polymyxin infused  02/25/17:      CBC:  CBC Latest Ref Rng & Units 02/22/2017 02/20/2017 02/19/2017  WBC 4.0 - 10.5 K/uL 5.8 4.3 5.7  Hemoglobin 12.0 - 15.0 g/dL 8.0(L) 7.7(L) 8.3(L)  Hematocrit 36.0 - 46.0 % 27.1(L) 26.8(L) 29.2(L)  Platelets 150 - 400 K/uL 242 196 208     BMET No results for input(s): NA, K, CL, CO2, GLUCOSE, BUN, CREATININE, CALCIUM in the last 72 hours.   Liver Panel  No results for input(s): PROT, ALBUMIN, AST, ALT, ALKPHOS, BILITOT,  BILIDIR, IBILI in the last 72 hours.     Sedimentation Rate No results for input(s): ESRSEDRATE in the last 72 hours. C-Reactive Protein No results for input(s): CRP in the last 72 hours.  Micro Results: Recent Results (from the past 720 hour(s))  Wet prep, genital     Status: Abnormal   Collection Time: 02/13/17  6:38 PM  Result Value Ref Range Status   Yeast Wet Prep HPF POC NONE SEEN NONE SEEN Final   Trich, Wet Prep NONE SEEN NONE SEEN Final   Clue Cells Wet Prep HPF POC NONE SEEN NONE SEEN Final   WBC, Wet Prep HPF POC MODERATE (A) NONE SEEN Final   Sperm NONE SEEN  Final  MRSA PCR Screening     Status: None   Collection Time: 02/14/17 11:20 AM  Result Value Ref Range Status   MRSA by PCR NEGATIVE NEGATIVE Final    Comment:        The GeneXpert MRSA Assay (FDA approved for NASAL specimens only), is one component of a comprehensive MRSA colonization surveillance program. It is not intended to diagnose MRSA infection nor to guide or monitor treatment for MRSA infections.   Aerobic/Anaerobic Culture (surgical/deep wound)     Status: None (Preliminary result)   Collection Time: 02/18/17 12:02 PM  Result Value Ref Range Status   Specimen Description TISSUE SOFT LT SACRAL ULCER  Final   Special Requests TISSUE SENT INSIDE SWABS  Final   Gram Stain   Final    FEW WBC PRESENT,BOTH PMN AND MONONUCLEAR ABUNDANT GRAM POSITIVE COCCI ABUNDANT GRAM NEGATIVE RODS ABUNDANT GRAM POSITIVE RODS    Culture   Final    ABUNDANT PROTEUS MIRABILIS ABUNDANT ACINETOBACTER CALCOACETICUS/BAUMANNII COMPLEX MULTI-DRUG RESISTANT ORGANISM MODERATE METHICILLIN RESISTANT STAPHYLOCOCCUS AUREUS MIXED ANAEROBIC FLORA PRESENT.  CALL LAB IF FURTHER IID REQUIRED. RESULT CALLED TO, READ BACK BY AND VERIFIED WITH: K BLUM,RN AT 1142 02/24/17 BY L BENFIELD CONCERNING RESISTANT ACINETOBACTER WILL REFER ISOLATE TO [facility] AND [facility] FOR ADDITIONAL SENSITIVITIES Performed at [facility],  [place] [address] [place], [place] [address]    Report Status PENDING  Incomplete   Organism ID, Bacteria PROTEUS MIRABILIS  Final   Organism ID, Bacteria METHICILLIN RESISTANT STAPHYLOCOCCUS AUREUS  Final   Organism ID, Bacteria ACINETOBACTER CALCOACETICUS/BAUMANNII COMPLEX  Final      Susceptibility   Acinetobacter calcoaceticus/baumannii complex - MIC*    CEFTAZIDIME >=64 RESISTANT Resistant  CEFTRIAXONE >=64 RESISTANT Resistant     CIPROFLOXACIN >=4 RESISTANT Resistant     GENTAMICIN >=16 RESISTANT Resistant     IMIPENEM >=16 RESISTANT Resistant     PIP/TAZO >=128 RESISTANT Resistant     TRIMETH/SULFA >=320 RESISTANT Resistant     CEFEPIME >=64 RESISTANT Resistant     AMPICILLIN/SULBACTAM RESISTANT Resistant     LEVOFLOXACIN Value in next row Intermediate      INTERMEDIATE4    * ABUNDANT ACINETOBACTER CALCOACETICUS/BAUMANNII COMPLEX   Methicillin resistant staphylococcus aureus - MIC*    CIPROFLOXACIN Value in next row Resistant      INTERMEDIATE4    ERYTHROMYCIN Value in next row Sensitive      INTERMEDIATE4    GENTAMICIN Value in next row Sensitive      INTERMEDIATE4    OXACILLIN Value in next row Resistant      INTERMEDIATE4    TETRACYCLINE Value in next row Sensitive      INTERMEDIATE4    VANCOMYCIN Value in next row Sensitive      INTERMEDIATE4    TRIMETH/SULFA Value in next row Sensitive      INTERMEDIATE4    CLINDAMYCIN Value in next row Sensitive      INTERMEDIATE4    RIFAMPIN Value in next row Sensitive      INTERMEDIATE4    Inducible Clindamycin Value in next row Sensitive      INTERMEDIATE4    * MODERATE METHICILLIN RESISTANT STAPHYLOCOCCUS AUREUS   Proteus mirabilis - MIC*    AMPICILLIN Value in next row Sensitive      INTERMEDIATE4    CEFAZOLIN Value in next row Sensitive      INTERMEDIATE4    CEFEPIME Value in next row Sensitive      INTERMEDIATE4    CEFTAZIDIME Value in next row Sensitive      INTERMEDIATE4    CEFTRIAXONE Value in next row Sensitive        INTERMEDIATE4    CIPROFLOXACIN Value in next row Sensitive      INTERMEDIATE4    GENTAMICIN Value in next row Sensitive      INTERMEDIATE4    IMIPENEM Value in next row Sensitive      INTERMEDIATE4    TRIMETH/SULFA Value in next row Sensitive      INTERMEDIATE4    AMPICILLIN/SULBACTAM Value in next row Sensitive      INTERMEDIATE4    PIP/TAZO Value in next row Sensitive      INTERMEDIATE4    * ABUNDANT PROTEUS MIRABILIS  Aerobic/Anaerobic Culture (surgical/deep wound)     Status: None   Collection Time: 02/18/17 12:14 PM  Result Value Ref Range Status   Specimen Description FLUID SACRAL ULCER  Final   Special Requests NONE  Final   Gram Stain   Final    RARE WBC PRESENT,BOTH PMN AND MONONUCLEAR MODERATE GRAM POSITIVE COCCI MODERATE GRAM POSITIVE RODS Performed at [facility], [address], [place], [place] [address]    Culture   Final    MODERATE PROTEUS MIRABILIS FEW METHICILLIN RESISTANT STAPHYLOCOCCUS AUREUS MIXED ANAEROBIC FLORA PRESENT.  CALL LAB IF FURTHER IID REQUIRED.    Report Status 02/23/2017 FINAL  Final   Organism ID, Bacteria PROTEUS MIRABILIS  Final   Organism ID, Bacteria METHICILLIN RESISTANT STAPHYLOCOCCUS AUREUS  Final      Susceptibility   Methicillin resistant staphylococcus aureus - MIC*    CIPROFLOXACIN >=8 RESISTANT Resistant     ERYTHROMYCIN 0.5 SENSITIVE Sensitive     GENTAMICIN <=  0.5 SENSITIVE Sensitive     OXACILLIN >=4 RESISTANT Resistant     TETRACYCLINE <=1 SENSITIVE Sensitive     VANCOMYCIN 1 SENSITIVE Sensitive     TRIMETH/SULFA <=10 SENSITIVE Sensitive     CLINDAMYCIN <=0.25 SENSITIVE Sensitive     RIFAMPIN <=0.5 SENSITIVE Sensitive     Inducible Clindamycin NEGATIVE Sensitive     * FEW METHICILLIN RESISTANT STAPHYLOCOCCUS AUREUS   Proteus mirabilis - MIC*    AMPICILLIN <=2 SENSITIVE Sensitive     CEFAZOLIN <=4 SENSITIVE Sensitive     CEFEPIME <=1 SENSITIVE Sensitive     CEFTAZIDIME <=1 SENSITIVE Sensitive      CEFTRIAXONE <=1 SENSITIVE Sensitive     CIPROFLOXACIN <=0.25 SENSITIVE Sensitive     GENTAMICIN <=1 SENSITIVE Sensitive     IMIPENEM 2 SENSITIVE Sensitive     TRIMETH/SULFA <=20 SENSITIVE Sensitive     AMPICILLIN/SULBACTAM <=2 SENSITIVE Sensitive     PIP/TAZO <=4 SENSITIVE Sensitive     * MODERATE PROTEUS MIRABILIS    Studies/Results: No results found.    Assessment/Plan:  INTERVAL HISTORY:  After my visit later in the day around [name] were sent to [facility] and organism tested R to minocycline, tetracyline and rifampin at [place] Problem:   Decubitus ulcer of left ischial area, stage III [facility]) Active Problems:   Paraplegia ([place])   History of transverse myelitis   Decubitus ulcer   Osteomyelitis, pelvis ([place])   Wound disruption   MRSA infection   Acinetobacter lwoffi infection   Proteus infection   Allergic reaction    [name] is a 36 y.o. 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 Excell Seltzer of skin, subcutaneous tissue and periosteum left ischial decubitus ulcer--> cultures which yieled Proteus, MDR Acinetobacter and MRSA  Cultures have yielded Proteus (fairly S), MRSA  (tetracycline S) and MDR Acinetobacter R to all antibiotics tested so far including now tetracycline, minocycline, and rifampin  Tests to [facility] for : Polymyxin, tygacil are still pending  She had hives  with polymyxin infusion  I will dc minocycline, continue high dose levaquin and await further sensi data  Continue daptomycin  I also put in call to Dr. Excell Seltzer who is in OR yesterday to clarify if ANY of tissue sent for culture was from bone.   I will reach out again to him tomorrow am.  I am not eager but could consider trial of polymyxin with premedication with  benadryl  Will discuss further with ID pharmacy.     LOS: 11 days   Alcide Evener 02/27/2017, 8:07 PM

## 2017-02-28 ENCOUNTER — Other Ambulatory Visit: Payer: Self-pay | Admitting: Pharmacist

## 2017-02-28 DIAGNOSIS — T7840XD Allergy, unspecified, subsequent encounter: Secondary | ICD-10-CM

## 2017-02-28 NOTE — Progress Notes (Addendum)
Subjective:  No new complaints today  Antibiotics:  Anti-infectives (From admission, onward)   Start     Dose/Rate Route Frequency Ordered Stop   02/26/17 1700  levofloxacin (LEVAQUIN) tablet 750 mg     750 mg Oral Daily 02/26/17 1520     02/25/17 2330  polymyxin B 89.55 mg in dextrose 5 % 500 mL IVPB  Status:  Discontinued     1.5 mg/kg  59.7 kg 500 mL/hr over 1 Hours Intravenous Every 12 hours 02/25/17 1127 02/25/17 1442   02/25/17 1800  levofloxacin (LEVAQUIN) IVPB 750 mg  Status:  Discontinued     750 mg 100 mL/hr over 90 Minutes Intravenous Every 24 hours 02/25/17 1122 02/26/17 1520   02/25/17 1600  minocycline (MINOCIN,DYNACIN) capsule 100 mg  Status:  Discontinued     100 mg Oral 2 times daily 02/25/17 1442 02/27/17 1930   02/25/17 1230  polymyxin B 149.25 mg in dextrose 5 % 500 mL IVPB  Status:  Discontinued     2.5 mg/kg  59.7 kg 250 mL/hr over 2 Hours Intravenous  Once 02/25/17 1127 02/25/17 1442   02/25/17 1000  cefTAZidime (FORTAZ) 2 g in dextrose 5 % 50 mL IVPB  Status:  Discontinued     2 g 100 mL/hr over 30 Minutes Intravenous Every 8 hours 02/25/17 0942 02/25/17 1122   02/20/17 1600  meropenem (MERREM) 1 g in sodium chloride 0.9 % 100 mL IVPB  Status:  Discontinued     1 g 200 mL/hr over 30 Minutes Intravenous Every 8 hours 02/20/17 1456 02/25/17 0942   02/19/17 2200  metroNIDAZOLE (FLAGYL) tablet 500 mg  Status:  Discontinued     500 mg Oral Every 8 hours 02/19/17 2043 02/20/17 1430   02/18/17 1800  cefTAZidime (FORTAZ) 2 g in dextrose 5 % 50 mL IVPB  Status:  Discontinued     2 g 100 mL/hr over 30 Minutes Intravenous Every 8 hours 02/18/17 1631 02/20/17 1454   02/18/17 1800  DAPTOmycin (CUBICIN) 360 mg in sodium chloride 0.9 % IVPB     360 mg 214.4 mL/hr over 30 Minutes Intravenous Every 24 hours 02/18/17 1633        Medications: Scheduled Meds: . baclofen  20 mg Oral TID  . bisacodyl  10 mg Rectal Daily  . enoxaparin (LOVENOX) injection  40  mg Subcutaneous Q24H  . gabapentin  800 mg Oral TID  . lactose free nutrition  237 mL Oral TID WC  . lamoTRIgine  125 mg Oral Daily  . levofloxacin  750 mg Oral Daily  . lubiprostone  24 mcg Oral Q breakfast  . pantoprazole  20 mg Oral Daily  . senna-docusate  1 tablet Oral BID  . sertraline  100 mg Oral Daily   Continuous Infusions: . 0.9 % NaCl with KCl 20 mEq / L 10 mL/hr at 02/25/17 2309  . DAPTOmycin (CUBICIN)  IV Stopped (02/27/17 2002)   PRN Meds:.diazepam, diphenhydrAMINE, fluticasone, ibuprofen, ondansetron (ZOFRAN) IV, sodium chloride flush    Objective: Weight change:   Intake/Output Summary (Last 24 hours) at 02/28/2017 1659 Last data filed at 02/28/2017 1648 Gross per 24 hour  Intake 592.2 ml  Output 1175 ml  Net -582.8 ml   Blood pressure 109/63, pulse 90, temperature 98.5 F (36.9 C), temperature source Oral, resp. rate 17, height 5\' 3"  (1.6 m), weight 134 lb (60.8 kg), SpO2 100 %. Temp:  [98.5 F (36.9 C)-98.8 F (37.1 C)] 98.5 F (  36.9 C) (11/29 1432) Pulse Rate:  [86-95] 90 (11/29 1432) Resp:  [17-18] 17 (11/29 1432) BP: (96-111)/(56-70) 109/63 (11/29 1432) SpO2:  [100 %] 100 % (11/29 1432) Weight:  [134 lb (60.8 kg)] 134 lb (60.8 kg) (11/28 1900)  Physical Exam: General: Alert and awake, oriented x3, not in any acute distress. CV: rr Pulm; no wheezes, resp distress Neuro: no new focal deficits  Skin: Hives resolved see picture from yesterday with  hives on arm where polymyxin infused  02/25/17:      Wound examined with General Surgery   See pictures   02/28/17:        CBC:  CBC Latest Ref Rng & Units 02/22/2017 02/20/2017 02/19/2017  WBC 4.0 - 10.5 K/uL 5.8 4.3 5.7  Hemoglobin 12.0 - 15.0 g/dL 8.0(L) 7.7(L) 8.3(L)  Hematocrit 36.0 - 46.0 % 27.1(L) 26.8(L) 29.2(L)  Platelets 150 - 400 K/uL 242 196 208     BMET No results for input(s): NA, K, CL, CO2, GLUCOSE, BUN, CREATININE, CALCIUM in the last 72 hours.   Liver  Panel  No results for input(s): PROT, ALBUMIN, AST, ALT, ALKPHOS, BILITOT, BILIDIR, IBILI in the last 72 hours.     Sedimentation Rate No results for input(s): ESRSEDRATE in the last 72 hours. C-Reactive Protein No results for input(s): CRP in the last 72 hours.  Micro Results: Recent Results (from the past 720 hour(s))  Wet prep, genital     Status: Abnormal   Collection Time: 02/13/17  6:38 PM  Result Value Ref Range Status   Yeast Wet Prep HPF POC NONE SEEN NONE SEEN Final   Trich, Wet Prep NONE SEEN NONE SEEN Final   Clue Cells Wet Prep HPF POC NONE SEEN NONE SEEN Final   WBC, Wet Prep HPF POC MODERATE (A) NONE SEEN Final   Sperm NONE SEEN  Final  MRSA PCR Screening     Status: None   Collection Time: 02/14/17 11:20 AM  Result Value Ref Range Status   MRSA by PCR NEGATIVE NEGATIVE Final    Comment:        The GeneXpert MRSA Assay (FDA approved for NASAL specimens only), is one component of a comprehensive MRSA colonization surveillance program. It is not intended to diagnose MRSA infection nor to guide or monitor treatment for MRSA infections.   Aerobic/Anaerobic Culture (surgical/deep wound)     Status: None (Preliminary result)   Collection Time: 02/18/17 12:02 PM  Result Value Ref Range Status   Specimen Description TISSUE SOFT LT SACRAL ULCER  Final   Special Requests TISSUE SENT INSIDE SWABS  Final   Gram Stain   Final    FEW WBC PRESENT,BOTH PMN AND MONONUCLEAR ABUNDANT GRAM POSITIVE COCCI ABUNDANT GRAM NEGATIVE RODS ABUNDANT GRAM POSITIVE RODS    Culture   Final    ABUNDANT PROTEUS MIRABILIS ABUNDANT ACINETOBACTER CALCOACETICUS/BAUMANNII COMPLEX MULTI-DRUG RESISTANT ORGANISM MODERATE METHICILLIN RESISTANT STAPHYLOCOCCUS AUREUS MIXED ANAEROBIC FLORA PRESENT.  CALL LAB IF FURTHER IID REQUIRED. RESULT CALLED TO, READ BACK BY AND VERIFIED WITH: K BLUM,RN AT 1142 02/24/17 BY L BENFIELD CONCERNING RESISTANT ACINETOBACTER WILL REFER ISOLATE TO [facility] AND  [facility] FOR ADDITIONAL SENSITIVITIES Performed at [facility], [place] [address], [place], [place] [address]    Report Status PENDING  Incomplete   Organism ID, Bacteria PROTEUS MIRABILIS  Final   Organism ID, Bacteria METHICILLIN RESISTANT STAPHYLOCOCCUS AUREUS  Final   Organism ID, Bacteria ACINETOBACTER CALCOACETICUS/BAUMANNII COMPLEX  Final      Susceptibility  Acinetobacter calcoaceticus/baumannii complex - MIC*    CEFTAZIDIME >=64 RESISTANT Resistant     CEFTRIAXONE >=64 RESISTANT Resistant     CIPROFLOXACIN >=4 RESISTANT Resistant     GENTAMICIN >=16 RESISTANT Resistant     IMIPENEM >=16 RESISTANT Resistant     PIP/TAZO >=128 RESISTANT Resistant     TRIMETH/SULFA >=320 RESISTANT Resistant     CEFEPIME >=64 RESISTANT Resistant     AMPICILLIN/SULBACTAM RESISTANT Resistant     LEVOFLOXACIN Value in next row Intermediate      INTERMEDIATE4    * ABUNDANT ACINETOBACTER CALCOACETICUS/BAUMANNII COMPLEX   Methicillin resistant staphylococcus aureus - MIC*    CIPROFLOXACIN Value in next row Resistant      INTERMEDIATE4    ERYTHROMYCIN Value in next row Sensitive      INTERMEDIATE4    GENTAMICIN Value in next row Sensitive      INTERMEDIATE4    OXACILLIN Value in next row Resistant      INTERMEDIATE4    TETRACYCLINE Value in next row Sensitive      INTERMEDIATE4    VANCOMYCIN Value in next row Sensitive      INTERMEDIATE4    TRIMETH/SULFA Value in next row Sensitive      INTERMEDIATE4    CLINDAMYCIN Value in next row Sensitive      INTERMEDIATE4    RIFAMPIN Value in next row Sensitive      INTERMEDIATE4    Inducible Clindamycin Value in next row Sensitive      INTERMEDIATE4    * MODERATE METHICILLIN RESISTANT STAPHYLOCOCCUS AUREUS   Proteus mirabilis - MIC*    AMPICILLIN Value in next row Sensitive      INTERMEDIATE4    CEFAZOLIN Value in next row Sensitive      INTERMEDIATE4    CEFEPIME Value in next row Sensitive      INTERMEDIATE4    CEFTAZIDIME Value in next  row Sensitive      INTERMEDIATE4    CEFTRIAXONE Value in next row Sensitive      INTERMEDIATE4    CIPROFLOXACIN Value in next row Sensitive      INTERMEDIATE4    GENTAMICIN Value in next row Sensitive      INTERMEDIATE4    IMIPENEM Value in next row Sensitive      INTERMEDIATE4    TRIMETH/SULFA Value in next row Sensitive      INTERMEDIATE4    AMPICILLIN/SULBACTAM Value in next row Sensitive      INTERMEDIATE4    PIP/TAZO Value in next row Sensitive      INTERMEDIATE4    * ABUNDANT PROTEUS MIRABILIS  Aerobic/Anaerobic Culture (surgical/deep wound)     Status: None   Collection Time: 02/18/17 12:14 PM  Result Value Ref Range Status   Specimen Description FLUID SACRAL ULCER  Final   Special Requests NONE  Final   Gram Stain   Final    RARE WBC PRESENT,BOTH PMN AND MONONUCLEAR MODERATE GRAM POSITIVE COCCI MODERATE GRAM POSITIVE RODS Performed at [facility], [address] [place], [place] [address]    Culture   Final    MODERATE PROTEUS MIRABILIS FEW METHICILLIN RESISTANT STAPHYLOCOCCUS AUREUS MIXED ANAEROBIC FLORA PRESENT.  CALL LAB IF FURTHER IID REQUIRED.    Report Status 02/23/2017 FINAL  Final   Organism ID, Bacteria PROTEUS MIRABILIS  Final   Organism ID, Bacteria METHICILLIN RESISTANT STAPHYLOCOCCUS AUREUS  Final      Susceptibility   Methicillin resistant staphylococcus aureus - MIC*    CIPROFLOXACIN >=8  RESISTANT Resistant     ERYTHROMYCIN 0.5 SENSITIVE Sensitive     GENTAMICIN <=0.5 SENSITIVE Sensitive     OXACILLIN >=4 RESISTANT Resistant     TETRACYCLINE <=1 SENSITIVE Sensitive     VANCOMYCIN 1 SENSITIVE Sensitive     TRIMETH/SULFA <=10 SENSITIVE Sensitive     CLINDAMYCIN <=0.25 SENSITIVE Sensitive     RIFAMPIN <=0.5 SENSITIVE Sensitive     Inducible Clindamycin NEGATIVE Sensitive     * FEW METHICILLIN RESISTANT STAPHYLOCOCCUS AUREUS   Proteus mirabilis - MIC*    AMPICILLIN <=2 SENSITIVE Sensitive     CEFAZOLIN <=4 SENSITIVE Sensitive     CEFEPIME  <=1 SENSITIVE Sensitive     CEFTAZIDIME <=1 SENSITIVE Sensitive     CEFTRIAXONE <=1 SENSITIVE Sensitive     CIPROFLOXACIN <=0.25 SENSITIVE Sensitive     GENTAMICIN <=1 SENSITIVE Sensitive     IMIPENEM 2 SENSITIVE Sensitive     TRIMETH/SULFA <=20 SENSITIVE Sensitive     AMPICILLIN/SULBACTAM <=2 SENSITIVE Sensitive     PIP/TAZO <=4 SENSITIVE Sensitive     * MODERATE PROTEUS MIRABILIS    Studies/Results: No results found.    Assessment/Plan:  INTERVAL HISTORY:  Considering desternalization to polymyxin   Principal Problem:   Decubitus ulcer of left ischial area, stage III (HCC) Active Problems:   Paraplegia (HCC)   History of transverse myelitis   Decubitus ulcer   Osteomyelitis, pelvis (HCC)   Wound disruption   MRSA infection   Acinetobacter lwoffi infection   Proteus infection   Allergic reaction    [name] is a 36 y.o. 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 Excell Seltzer of skin, subcutaneous tissue and periosteum left ischial decubitus ulcer--> cultures which yieled Proteus, MDR Acinetobacter and MRSA. Discussed with Dr. Excell Seltzer and her infection did extend to periosteum though he did not send cultures of bone itself he is very much concerned for osteomyelitis.   Cultures have yielded Proteus (fairly S), MRSA  (tetracycline S) and MDR Acinetobacter R to all antibiotics tested so far including now tetracycline, minocycline, and rifampin  Tests to [facility] for : Polymyxin, tygacil are still pending and I checked with Micro and they are not back yet  She had hives  with polymyxin infusion  Continue daptomycin, levaquin  I have been discussing with Cassie Kuppelweiser who found care of desensitization to colistin having been done with 36 yo with CF  We will consider  desensitization regimen to polymyxin  At present I am still awaiting the results oft he tygacil S but I am skeptical that this drug will be active. If it is active we could also consider trying to get one of the newer tetracylines such as Eravacycline or Omadacycline  I spent greater than 35 minutes with the patient including greater than 50% of time in face to face counsel of the patient and in coordination of her care with ID pharmacy,  microbiology lab and General Surgery.      LOS: 12 days   Alcide Evener 02/28/2017, 4:59 PM

## 2017-02-28 NOTE — Plan of Care (Signed)
  Elimination: Will not experience complications related to bowel motility 02/28/2017 0927 - Progressing by Dorene Sorrow, RN   Pain Managment: General experience of comfort will improve Description Denies pain  02/28/2017 0927 - Progressing by Dorene Sorrow, RN

## 2017-02-28 NOTE — Progress Notes (Signed)
PROGRESS NOTE    [name]  ZSW:[id number] DOB: 1981/03/27 DOA: 02/13/2017 PCP: Lennie Odor, PA-C   Brief Narrative: 36 year old female with history of transverse myelitis, paraplegic bedbound, chronic sacral decub with a ulcer who comes in with foul-smelling discharge consistent with osteomyelitis.  Patient underwent surgical debridement and cultures growing resistant bacteria.  On antibiotics by infectious disease.  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  Underwent surgical debridement on November 19 by general surgery.  Cultures growing Acinetobacter resistant to all antibiotic, pansensitive Proteus and MRSA.   -Patient is currently on oral Levaquin, iv daptomycin through PIcc line (placed on11/26 yb IR). Polymixin x1 on 11/26 , stoppped due to hives at iv site - evaluation ongoing for long-term antibiotics plan including MDR is acinectobacter resistant to all antibiotics tested, the organism is being sent to [place] for further evaluation. -Continue supportive care. -follow ID recommendation     #History of multiple sclerosis and transverse myelitis, paraplegia:  -Bed to wheel chair bound Continue supportive care.   Continue Neurontin, baclofen, Lamictal    #Depression: Continue Zoloft.  Mood is stable.  #Chronic anemia: Folate /b12/tsh unremarkable Low iron, but avoid iron supplement in the setting of infection hgb 8, no indication for transfusion  Plan discussed with the patient and her mother at bedside.  DVT prophylaxis: Lovenox subcutaneous Code Status: Full code Family Communication: Patient's mother at bedside Disposition Plan: Currently admitted    Consultants:   Surgery  Infectious disease  Wound care  Procedures:  Surgical debridement of sacral decubiti on 11/19 PICC line placement by IR on 11/26  Antimicrobials:  fortaz 11/19-11/21 Meropenem from  11/21- 11/26 Minocycline from 11/26-11/28 polymycin on 11/26 IV daptomycin from 11/19- oral Levaquin from 11/26  Subjective:  Seen and examined at bedside. No fever, denies pain. Mother at bedside  Objective: Vitals:   02/27/17 2044 02/28/17 0601 02/28/17 0819 02/28/17 1038  BP: 103/61 (!) 96/56 104/64 111/70  Pulse: 95 90 90 86  Resp: 18 18 18 18   Temp: 98.7 F (37.1 C) 98.6 F (37 C) 98.8 F (37.1 C) 98.8 F (37.1 C)  TempSrc: Oral Oral Oral Oral  SpO2: 100% 100% 100% 100%  Weight:      Height:        Intake/Output Summary (Last 24 hours) at 02/28/2017 1245 Last data filed at 02/28/2017 1112 Gross per 24 hour  Intake 592.37 ml  Output 1300 ml  Net -707.63 ml   Filed Weights   02/14/17 0057 02/27/17 1900  Weight: 59.7 kg (131 lb 9.8 oz) 60.8 kg (134 lb)    Examination:   General:  NAD  Cardiovascular: RRR  Respiratory: CTABL  Abdomen: Soft/ND/NT, positive BS, + urostomy  Musculoskeletal: No Edema, chronic spastic paraplegia with some sensation in lower extremities  Neuro: alert, oriented   Sacral wound s/p debridement, per general surgery no further debridement needed at this time       Extremities: Spastic lower extremities Psychiatry: Judgement and insight appear normal. Mood & affect appropriate.     Data Reviewed: I have personally reviewed following labs and imaging studies  CBC: Recent Labs  Lab 02/22/17 0353  WBC 5.8  HGB 8.0*  HCT 27.1*  MCV 72.5*  PLT 322   Basic Metabolic Panel: No results for input(s): NA, K, CL, CO2, GLUCOSE, BUN, CREATININE, CALCIUM, MG, PHOS in the last 168 hours. GFR: CrCl cannot be calculated (This lab value cannot  be used to calculate CrCl because it is not a number: <0.30). Liver Function Tests: No results for input(s): AST, ALT, ALKPHOS, BILITOT, PROT, ALBUMIN in the last 168 hours. No results for input(s): LIPASE, AMYLASE in the last 168 hours. No results for input(s): AMMONIA in the last 168  hours. Coagulation Profile: No results for input(s): INR, PROTIME in the last 168 hours. Cardiac Enzymes: Recent Labs  Lab 02/25/17 0529  CKTOTAL 15*   BNP (last 3 results) No results for input(s): PROBNP in the last 8760 hours. HbA1C: No results for input(s): HGBA1C in the last 72 hours. CBG: No results for input(s): GLUCAP in the last 168 hours. Lipid Profile: No results for input(s): CHOL, HDL, LDLCALC, TRIG, CHOLHDL, LDLDIRECT in the last 72 hours. Thyroid Function Tests: No results for input(s): TSH, T4TOTAL, FREET4, T3FREE, THYROIDAB in the last 72 hours. Anemia Panel: No results for input(s): VITAMINB12, FOLATE, FERRITIN, TIBC, IRON, RETICCTPCT in the last 72 hours. Sepsis Labs: No results for input(s): PROCALCITON, LATICACIDVEN in the last 168 hours.  No results found for this or any previous visit (from the past 240 hour(s)).       Radiology Studies: No results found.      Scheduled Meds: . baclofen  20 mg Oral TID  . bisacodyl  10 mg Rectal Daily  . enoxaparin (LOVENOX) injection  40 mg Subcutaneous Q24H  . gabapentin  800 mg Oral TID  . lactose free nutrition  237 mL Oral TID WC  . lamoTRIgine  125 mg Oral Daily  . levofloxacin  750 mg Oral Daily  . lubiprostone  24 mcg Oral Q breakfast  . pantoprazole  20 mg Oral Daily  . senna-docusate  1 tablet Oral BID  . sertraline  100 mg Oral Daily   Continuous Infusions: . 0.9 % NaCl with KCl 20 mEq / L 10 mL/hr at 02/25/17 2309  . DAPTOmycin (CUBICIN)  IV Stopped (02/27/17 2002)     LOS: 12 days    Florencia Reasons, MD PhD [facility] Pager [phone]  If 7PM-7AM, please contact night-coverage [email or web address] Password [email or web address] 02/28/2017, 12:45 PM

## 2017-02-28 NOTE — Progress Notes (Signed)
Pharmacy Antibiotic Note  [name] is a 36 y.o. female admitted on 02/13/2017 with decubitus ulcer and early pelvic osteomyelitis s/p I&D.  Tissue from sacral ulcer culture growing Proteus mirabilis and Acinetobacer complex.  Ceftaz/Flagyl switched to meropenem and continuing daptomycin.  Pharmacy has been consulted for meropenem dosing now in addition to daptomycin.     Significant events: Res Acinetobacter Cx- susceptibility testing sent to - [facility] : resistant to Minocycline,TCN, Rifampin - [facility] for Polymixin, Tygacycline, results pending  - 11/26: Meropenem d/c (Acinetobacter resistance), Polymixin B IV x1, stopped due to hives at IV site - 11/28: Minocycline d/c, Acinetobacter resistance  Plan: Continue Daptomycin 360mg  IV q24h (6mg /kg) (MRSA) Continue Levaquin 750mg  IV q24 (Proteus, some Acinetobacter cvg) CK weekly while on daptomycin- last 11/26 = 15, next due 12/3 ID MD may consider trial of Polymixin B with Benadryl pre-med  Height: 5\' 3"  (160 cm) Weight: 134 lb (60.8 kg) IBW/kg (Calculated) : 52.4  Temp (24hrs), Avg:98.6 F (37 C), Min:97.9 F (36.6 C), Max:98.8 F (37.1 C)  Recent Labs  Lab 02/22/17 0353  WBC 5.8    CrCl cannot be calculated (This lab value cannot be used to calculate CrCl because it is not a number: <0.30).    Allergies  Allergen Reactions  . Iodine Anaphylaxis  . Latex Anaphylaxis  . Other Anaphylaxis  . Polymyxin B Hives    Pt had hives in right arm with infusion of polymyxin on November 26th, 2018  . Shellfish-Derived Products Anaphylaxis    Reaction unknown  . Vancomycin Anaphylaxis  . Aspirin Other (See Comments)    Reaction unknown  . Strawberry Extract Hives and Rash   Antimicrobials this admission:  11/19 Ceftaz >> 11/21, 11/26 >> 11/26 11/21 merrem >> 11/26 11/19 Dapto >> 11/26 Levaquin >> 11/26 Polymixin B >> 11/26, Hives 11/26 Minocycline >> 11/28  Dose adjustments this admission:   Microbiology  results:  11/19 Sacral ulcer (soft tissue): abundant proteus (pansensitive) and acinetobacter (resistant to all tested except intermediate to levaquin) and now mod MRSA 11/19 Sacral ulcer (fluid): mod proteus mirabilis - pansensitive, few MRSA, mixed anaerobic flora 11/15 MRSA PCR: negative 11/14 Wet prep: moderate WBC Res Acinetobacter Cx- susceptibility testing sent to - [facility] : resistant to Minocycline,TCN, Rifampin - [facility] for Polymixin, Tygacycline, pending  Thank you for allowing pharmacy to be a part of this patient's care.  Minda Ditto PharmD Pager [phone] 02/28/2017, 10:55 AM

## 2017-02-28 NOTE — Progress Notes (Signed)
10 Days Post-Op    CV:ELFY ischial decubitus ulcer    Subjective: Looked at wound today and Dr/ Tommy Medal came in and took pictures at the same time.  It looks good, no further debridement needed.  Objective: Vital signs in last 24 hours: Temp:  [97.9 F (36.6 C)-98.8 F (37.1 C)] 98.8 F (37.1 C) (11/29 0819) Pulse Rate:  [90-95] 90 (11/29 0819) Resp:  [16-18] 18 (11/29 0819) BP: (96-112)/(54-64) 104/64 (11/29 0819) SpO2:  [100 %] 100 % (11/29 0819) Weight:  [60.8 kg (134 lb)] 60.8 kg (134 lb) (11/28 1900) Last BM Date: 02/27/17  Intake/Output from previous day: 11/28 0701 - 11/29 0700 In: 557.4 [P.O.:240; I.V.:210.2; IV Piggyback:107.2] Out: 1225 [Urine:1225] Intake/Output this shift: Total I/O In: 125 [P.O.:125] Out: 125 [Urine:125]  General appearance: alert, cooperative and no distress Skin: open site looks fine.  Lab Results:  No results for input(s): WBC, HGB, HCT, PLT in the last 72 hours.  BMET No results for input(s): NA, K, CL, CO2, GLUCOSE, BUN, CREATININE, CALCIUM in the last 72 hours. PT/INR No results for input(s): LABPROT, INR in the last 72 hours.  No results for input(s): AST, ALT, ALKPHOS, BILITOT, PROT, ALBUMIN in the last 168 hours.   Lipase     Component Value Date/Time   LIPASE 22 10/17/2016 1043     Medications: . baclofen  20 mg Oral TID  . bisacodyl  10 mg Rectal Daily  . enoxaparin (LOVENOX) injection  40 mg Subcutaneous Q24H  . gabapentin  800 mg Oral TID  . lactose free nutrition  237 mL Oral TID WC  . lamoTRIgine  125 mg Oral Daily  . levofloxacin  750 mg Oral Daily  . lubiprostone  24 mcg Oral Q breakfast  . pantoprazole  20 mg Oral Daily  . senna-docusate  1 tablet Oral BID  . sertraline  100 mg Oral Daily   . 0.9 % NaCl with KCl 20 mEq / L 10 mL/hr at 02/25/17 2309  . DAPTOmycin (CUBICIN)  IV Stopped (02/27/17 2002)   Anti-infectives (From admission, onward)   Start     Dose/Rate Route Frequency Ordered Stop   02/26/17 1700  levofloxacin (LEVAQUIN) tablet 750 mg     750 mg Oral Daily 02/26/17 1520     02/25/17 2330  polymyxin B 89.55 mg in dextrose 5 % 500 mL IVPB  Status:  Discontinued     1.5 mg/kg  59.7 kg 500 mL/hr over 1 Hours Intravenous Every 12 hours 02/25/17 1127 02/25/17 1442   02/25/17 1800  levofloxacin (LEVAQUIN) IVPB 750 mg  Status:  Discontinued     750 mg 100 mL/hr over 90 Minutes Intravenous Every 24 hours 02/25/17 1122 02/26/17 1520   02/25/17 1600  minocycline (MINOCIN,DYNACIN) capsule 100 mg  Status:  Discontinued     100 mg Oral 2 times daily 02/25/17 1442 02/27/17 1930   02/25/17 1230  polymyxin B 149.25 mg in dextrose 5 % 500 mL IVPB  Status:  Discontinued     2.5 mg/kg  59.7 kg 250 mL/hr over 2 Hours Intravenous  Once 02/25/17 1127 02/25/17 1442   02/25/17 1000  cefTAZidime (FORTAZ) 2 g in dextrose 5 % 50 mL IVPB  Status:  Discontinued     2 g 100 mL/hr over 30 Minutes Intravenous Every 8 hours 02/25/17 0942 02/25/17 1122   02/20/17 1600  meropenem (MERREM) 1 g in sodium chloride 0.9 % 100 mL IVPB  Status:  Discontinued     1  g 200 mL/hr over 30 Minutes Intravenous Every 8 hours 02/20/17 1456 02/25/17 0942   02/19/17 2200  metroNIDAZOLE (FLAGYL) tablet 500 mg  Status:  Discontinued     500 mg Oral Every 8 hours 02/19/17 2043 02/20/17 1430   02/18/17 1800  cefTAZidime (FORTAZ) 2 g in dextrose 5 % 50 mL IVPB  Status:  Discontinued     2 g 100 mL/hr over 30 Minutes Intravenous Every 8 hours 02/18/17 1631 02/20/17 1454   02/18/17 1800  DAPTOmycin (CUBICIN) 360 mg in sodium chloride 0.9 % IVPB     360 mg 214.4 mL/hr over 30 Minutes Intravenous Every 24 hours 02/18/17 1633        Assessment/Plan Multiple sclerosis/transverse myelitis and paraplegia Constipation Chronic microcytic anemia/iron deficiency anemia  Left ischial decubitus ulcer S/pDebridement skin, subcutaneous tissue and periosteum left ischial decubitus ulcer11/19 Dr. Excell Seltzer - POD10 -cultures  grew PROTEUS MIRABILIS, MRSA, ACINETOBACTER CALCOACETICUS/BAUMANNII COMPLEX  - ID following  ID -fortaz 11/19>>11/21 restarted 11/26>>, merrem 11/21>>11/26, flagyl 11/20>>11/21,  , daptomycin 11/19 =>> day 10 FEN -regular diet VTE -SCDs, lovenox Foley -none Follow up -wound clinic   Plan:  We will be available as needed.  Her Mother is adept at wound care and they have good assistance at home.  If there are any changes before she goes home please call.         LOS: 12 days    [name] 02/28/2017 [phone]

## 2017-03-01 DIAGNOSIS — Z1624 Resistance to multiple antibiotics: Secondary | ICD-10-CM

## 2017-03-01 DIAGNOSIS — A499 Bacterial infection, unspecified: Secondary | ICD-10-CM

## 2017-03-01 LAB — MISCELLANEOUS TEST

## 2017-03-01 MED ORDER — DEXTROSE 5 % IV SOLN
2.0000 g | Freq: Three times a day (TID) | INTRAVENOUS | Status: DC
  Administered 2017-03-01 – 2017-03-04 (×9): 2 g via INTRAVENOUS
  Filled 2017-03-01 (×11): qty 2

## 2017-03-01 NOTE — Progress Notes (Signed)
Pharmacy Antibiotic Note  [name] is a 36 y.o. female admitted on 02/13/2017 with decubitus ulcer and early pelvic osteomyelitis s/p I&D.  Tissue from sacral ulcer culture growing Proteus mirabilis and Acinetobacer complex.  Pharmacy currently dosing daptomycin and patient on levofloxacin.  Orders 11/30 to change levofloxacin to ceftazidime.    Significant events: Res Acinetobacter Cx- susceptibility testing sent to - [facility] : resistant to Minocycline,TCN, Rifampin - [facility] for Polymixin, Tygacycline, results pending  - 11/26: Meropenem d/c (Acinetobacter resistance), Polymixin B IV x1, stopped due to hives at IV site - 11/28: Minocycline d/c, Acinetobacter resistance - 11/30: levofloxacin to ceftazidime  Plan: Continue Daptomycin 360mg  IV q24h (6mg /kg) (MRSA) Ceftazidime 2gm IV q8h CK weekly while on daptomycin- last 11/26 = 15, next due 12/3 ID MD may consider trial of Polymixin B with Benadryl pre-med  Height: 5\' 3"  (160 cm) Weight: 134 lb (60.8 kg) IBW/kg (Calculated) : 52.4  Temp (24hrs), Avg:98.7 F (37.1 C), Min:98.5 F (36.9 C), Max:98.8 F (37.1 C)  No results for input(s): WBC, CREATININE, LATICACIDVEN, VANCOTROUGH, VANCOPEAK, VANCORANDOM, GENTTROUGH, GENTPEAK, GENTRANDOM, TOBRATROUGH, TOBRAPEAK, TOBRARND, AMIKACINPEAK, AMIKACINTROU, AMIKACIN in the last 168 hours.  CrCl cannot be calculated (This lab value cannot be used to calculate CrCl because it is not a number: <0.30).    Allergies  Allergen Reactions  . Iodine Anaphylaxis  . Latex Anaphylaxis  . Other Anaphylaxis  . Polymyxin B Hives    Pt had hives in right arm with infusion of polymyxin on November 26th, 2018  . Shellfish-Derived Products Anaphylaxis    Reaction unknown  . Vancomycin Anaphylaxis  . Aspirin Other (See Comments)    Reaction unknown  . Strawberry Extract Hives and Rash   Antimicrobials this admission:  11/19 Ceftaz >> 11/21, 11/26 >> 11/26, 11/30 >> 11/21 merrem >>  11/26 11/19 Dapto >> 11/26 Levaquin >>11/30 11/26 Polymixin B >> 11/26, Hives 11/26 Minocycline >> 11/28   Dose adjustments this admission:   Microbiology results:  11/19 Sacral ulcer (soft tissue): abundant proteus (pansensitive) and acinetobacter (resistant to all tested except intermediate to levaquin) and now mod MRSA 11/19 Sacral ulcer (fluid): mod proteus mirabilis - pansensitive, few MRSA, mixed anaerobic flora 11/15 MRSA PCR: negative 11/14 Wet prep: moderate WBC Res Acinetobacter Cx- susceptibility testing sent to - [facility] : resistant to Minocycline,TCN, Rifampin - [facility] for Polymixin, Tygacycline, pending  Thank you for allowing pharmacy to be a part of this patient's care.  Doreene Eland, PharmD, BCPS.   Pager: [phone] 03/01/2017 9:30 AM

## 2017-03-01 NOTE — Progress Notes (Signed)
PROGRESS NOTE    [name]  HQI:[id number] DOB: Feb 05, 1981 DOA: 02/13/2017 PCP: Lennie Odor, PA-C   Brief Narrative: 36 year old female with history of transverse myelitis, paraplegic bedbound, chronic sacral decub with a ulcer who comes in with foul-smelling discharge consistent with osteomyelitis.  Patient underwent surgical debridement and cultures growing resistant bacteria.  On antibiotics by infectious disease.  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  Underwent surgical debridement on November 19 by general surgery.  Cultures growing Acinetobacter resistant to all antibiotic, pansensitive Proteus and MRSA.   -Patient is currently on oral Levaquin, iv daptomycin through PIcc line (placed on11/26 yb IR). Polymixin x1 on 11/26 , stoppped due to hives at iv site - evaluation ongoing for long-term antibiotics plan including MDR is acinectobacter resistant to all antibiotics tested, the organism is being sent to [place] for further evaluation. -Continue supportive care. -follow ID recommendation     #History of multiple sclerosis and transverse myelitis, paraplegia:  -Bed to wheel chair bound Continue supportive care.   Continue Neurontin, baclofen, Lamictal    #Depression: Continue Zoloft.  Mood is stable.  #Chronic anemia: Folate /b12/tsh unremarkable Low iron, but avoid iron supplement in the setting of infection hgb 8, no indication for transfusion  Plan discussed with the patient and her mother at bedside.  DVT prophylaxis: Lovenox subcutaneous Code Status: Full code Family Communication: Patient's mother at bedside Disposition Plan: Currently admitted    Consultants:   Surgery  Infectious disease  Wound care  Procedures:  Surgical debridement of sacral decubiti on 11/19 PICC line placement by IR on 11/26  Antimicrobials:  fortaz 11/19-11/21 Meropenem from  11/21- 11/26 Minocycline from 11/26-11/28 polymycin on 11/26 IV daptomycin from 11/19- oral Levaquin from 11/26  Subjective:  Seen and examined at bedside. No fever, denies pain. Mother at bedside  Objective: Vitals:   02/28/17 1038 02/28/17 1432 02/28/17 2126 03/01/17 0607  BP: 111/70 109/63 97/65 (!) 91/49  Pulse: 86 90 98 86  Resp: 18 17 18 18   Temp: 98.8 F (37.1 C) 98.5 F (36.9 C) 98.8 F (37.1 C) 98.6 F (37 C)  TempSrc: Oral Oral Oral Oral  SpO2: 100% 100% 100% 99%  Weight:      Height:        Intake/Output Summary (Last 24 hours) at 03/01/2017 0847 Last data filed at 03/01/2017 1324 Gross per 24 hour  Intake 919 ml  Output 1175 ml  Net -256 ml   Filed Weights   02/14/17 0057 02/27/17 1900  Weight: 59.7 kg (131 lb 9.8 oz) 60.8 kg (134 lb)    Examination:   General:  NAD  Cardiovascular: RRR  Respiratory: CTABL  Abdomen: Soft/ND/NT, positive BS, + urostomy  Musculoskeletal: No Edema, chronic spastic paraplegia with some sensation in lower extremities  Neuro: alert, oriented   Sacral wound s/p debridement, per general surgery no further debridement needed at this time       Extremities: Spastic lower extremities Psychiatry: Judgement and insight appear normal. Mood & affect appropriate.     Data Reviewed: I have personally reviewed following labs and imaging studies  CBC: No results for input(s): WBC, NEUTROABS, HGB, HCT, MCV, PLT in the last 168 hours. Basic Metabolic Panel: No results for input(s): NA, K, CL, CO2, GLUCOSE, BUN, CREATININE, CALCIUM, MG, PHOS in the last 168 hours. GFR: CrCl cannot be calculated (This lab value cannot be used to calculate CrCl because it is  not a number: <0.30). Liver Function Tests: No results for input(s): AST, ALT, ALKPHOS, BILITOT, PROT, ALBUMIN in the last 168 hours. No results for input(s): LIPASE, AMYLASE in the last 168 hours. No results for input(s): AMMONIA in the last 168  hours. Coagulation Profile: No results for input(s): INR, PROTIME in the last 168 hours. Cardiac Enzymes: Recent Labs  Lab 02/25/17 0529  CKTOTAL 15*   BNP (last 3 results) No results for input(s): PROBNP in the last 8760 hours. HbA1C: No results for input(s): HGBA1C in the last 72 hours. CBG: No results for input(s): GLUCAP in the last 168 hours. Lipid Profile: No results for input(s): CHOL, HDL, LDLCALC, TRIG, CHOLHDL, LDLDIRECT in the last 72 hours. Thyroid Function Tests: No results for input(s): TSH, T4TOTAL, FREET4, T3FREE, THYROIDAB in the last 72 hours. Anemia Panel: No results for input(s): VITAMINB12, FOLATE, FERRITIN, TIBC, IRON, RETICCTPCT in the last 72 hours. Sepsis Labs: No results for input(s): PROCALCITON, LATICACIDVEN in the last 168 hours.  No results found for this or any previous visit (from the past 240 hour(s)).       Radiology Studies: No results found.      Scheduled Meds: . baclofen  20 mg Oral TID  . bisacodyl  10 mg Rectal Daily  . enoxaparin (LOVENOX) injection  40 mg Subcutaneous Q24H  . gabapentin  800 mg Oral TID  . lactose free nutrition  237 mL Oral TID WC  . lamoTRIgine  125 mg Oral Daily  . levofloxacin  750 mg Oral Daily  . lubiprostone  24 mcg Oral Q breakfast  . pantoprazole  20 mg Oral Daily  . senna-docusate  1 tablet Oral BID  . sertraline  100 mg Oral Daily   Continuous Infusions: . 0.9 % NaCl with KCl 20 mEq / L 10 mL/hr at 02/25/17 2309  . DAPTOmycin (CUBICIN)  IV Stopped (02/28/17 1930)     LOS: 13 days    Florencia Reasons, MD PhD [facility] Pager [phone]  If 7PM-7AM, please contact night-coverage [email or web address] Password [facility] 03/01/2017, 8:47 AM

## 2017-03-01 NOTE — Plan of Care (Signed)
  Coping: Level of anxiety will decrease 03/01/2017 2020 - Progressing by Dorene Sorrow, RN   Elimination: Will not experience complications related to bowel motility 03/01/2017 2020 - Progressing by Dorene Sorrow, RN   Pain Managment: General experience of comfort will improve Description Denies pain  03/01/2017 2020 - Progressing by Dorene Sorrow, RN

## 2017-03-01 NOTE — Progress Notes (Signed)
Subjective:  No new complaints today  Antibiotics:  Anti-infectives (From admission, onward)   Start     Dose/Rate Route Frequency Ordered Stop   03/01/17 1000  cefTAZidime (FORTAZ) 2 g in dextrose 5 % 50 mL IVPB     2 g 100 mL/hr over 30 Minutes Intravenous Every 8 hours 03/01/17 0920     02/26/17 1700  levofloxacin (LEVAQUIN) tablet 750 mg  Status:  Discontinued     750 mg Oral Daily 02/26/17 1520 03/01/17 0904   02/25/17 2330  polymyxin B 89.55 mg in dextrose 5 % 500 mL IVPB  Status:  Discontinued     1.5 mg/kg  59.7 kg 500 mL/hr over 1 Hours Intravenous Every 12 hours 02/25/17 1127 02/25/17 1442   02/25/17 1800  levofloxacin (LEVAQUIN) IVPB 750 mg  Status:  Discontinued     750 mg 100 mL/hr over 90 Minutes Intravenous Every 24 hours 02/25/17 1122 02/26/17 1520   02/25/17 1600  minocycline (MINOCIN,DYNACIN) capsule 100 mg  Status:  Discontinued     100 mg Oral 2 times daily 02/25/17 1442 02/27/17 1930   02/25/17 1230  polymyxin B 149.25 mg in dextrose 5 % 500 mL IVPB  Status:  Discontinued     2.5 mg/kg  59.7 kg 250 mL/hr over 2 Hours Intravenous  Once 02/25/17 1127 02/25/17 1442   02/25/17 1000  cefTAZidime (FORTAZ) 2 g in dextrose 5 % 50 mL IVPB  Status:  Discontinued     2 g 100 mL/hr over 30 Minutes Intravenous Every 8 hours 02/25/17 0942 02/25/17 1122   02/20/17 1600  meropenem (MERREM) 1 g in sodium chloride 0.9 % 100 mL IVPB  Status:  Discontinued     1 g 200 mL/hr over 30 Minutes Intravenous Every 8 hours 02/20/17 1456 02/25/17 0942   02/19/17 2200  metroNIDAZOLE (FLAGYL) tablet 500 mg  Status:  Discontinued     500 mg Oral Every 8 hours 02/19/17 2043 02/20/17 1430   02/18/17 1800  cefTAZidime (FORTAZ) 2 g in dextrose 5 % 50 mL IVPB  Status:  Discontinued     2 g 100 mL/hr over 30 Minutes Intravenous Every 8 hours 02/18/17 1631 02/20/17 1454   02/18/17 1800  DAPTOmycin (CUBICIN) 360 mg in sodium chloride 0.9 % IVPB     360 mg 214.4 mL/hr over 30 Minutes  Intravenous Every 24 hours 02/18/17 1633        Medications: Scheduled Meds: . baclofen  20 mg Oral TID  . bisacodyl  10 mg Rectal Daily  . enoxaparin (LOVENOX) injection  40 mg Subcutaneous Q24H  . gabapentin  800 mg Oral TID  . lactose free nutrition  237 mL Oral TID WC  . lamoTRIgine  125 mg Oral Daily  . lubiprostone  24 mcg Oral Q breakfast  . pantoprazole  20 mg Oral Daily  . senna-docusate  1 tablet Oral BID  . sertraline  100 mg Oral Daily   Continuous Infusions: . 0.9 % NaCl with KCl 20 mEq / L 10 mL/hr at 02/25/17 2309  . cefTAZidime (FORTAZ)  IV    . DAPTOmycin (CUBICIN)  IV Stopped (02/28/17 1930)   PRN Meds:.diazepam, diphenhydrAMINE, fluticasone, ibuprofen, ondansetron (ZOFRAN) IV, sodium chloride flush    Objective: Weight change:   Intake/Output Summary (Last 24 hours) at 03/01/2017 1056 Last data filed at 03/01/2017 0655 Gross per 24 hour  Intake 919 ml  Output 1175 ml  Net -256 ml   Blood  pressure (!) 91/49, pulse 86, temperature 98.6 F (37 C), temperature source Oral, resp. rate 18, height 5\' 3"  (1.6 m), weight 134 lb (60.8 kg), SpO2 99 %. Temp:  [98.5 F (36.9 C)-98.8 F (37.1 C)] 98.6 F (37 C) (11/30 0607) Pulse Rate:  [86-98] 86 (11/30 0607) Resp:  [17-18] 18 (11/30 0607) BP: (91-109)/(49-65) 91/49 (11/30 0607) SpO2:  [99 %-100 %] 99 % (11/30 1324)  Physical Exam: General: Alert and awake, oriented x3, not in any acute distress. CV: rr Pulm; no wheezes, resp distress Neuro: no new focal deficits  Skin: Hives resolved see picture from yesterday with  hives on arm where polymyxin infused  02/25/17:      Wound examined with General Surgery   See pictures   02/28/17:        CBC:  CBC Latest Ref Rng & Units 02/22/2017 02/20/2017 02/19/2017  WBC 4.0 - 10.5 K/uL 5.8 4.3 5.7  Hemoglobin 12.0 - 15.0 g/dL 8.0(L) 7.7(L) 8.3(L)  Hematocrit 36.0 - 46.0 % 27.1(L) 26.8(L) 29.2(L)  Platelets 150 - 400 K/uL 242 196 208      BMET No results for input(s): NA, K, CL, CO2, GLUCOSE, BUN, CREATININE, CALCIUM in the last 72 hours.   Liver Panel  No results for input(s): PROT, ALBUMIN, AST, ALT, ALKPHOS, BILITOT, BILIDIR, IBILI in the last 72 hours.     Sedimentation Rate No results for input(s): ESRSEDRATE in the last 72 hours. C-Reactive Protein No results for input(s): CRP in the last 72 hours.  Micro Results: Recent Results (from the past 720 hour(s))  Wet prep, genital     Status: Abnormal   Collection Time: 02/13/17  6:38 PM  Result Value Ref Range Status   Yeast Wet Prep HPF POC NONE SEEN NONE SEEN Final   Trich, Wet Prep NONE SEEN NONE SEEN Final   Clue Cells Wet Prep HPF POC NONE SEEN NONE SEEN Final   WBC, Wet Prep HPF POC MODERATE (A) NONE SEEN Final   Sperm NONE SEEN  Final  MRSA PCR Screening     Status: None   Collection Time: 02/14/17 11:20 AM  Result Value Ref Range Status   MRSA by PCR NEGATIVE NEGATIVE Final    Comment:        The GeneXpert MRSA Assay (FDA approved for NASAL specimens only), is one component of a comprehensive MRSA colonization surveillance program. It is not intended to diagnose MRSA infection nor to guide or monitor treatment for MRSA infections.   Aerobic/Anaerobic Culture (surgical/deep wound)     Status: None (Preliminary result)   Collection Time: 02/18/17 12:02 PM  Result Value Ref Range Status   Specimen Description TISSUE SOFT LT SACRAL ULCER  Final   Special Requests TISSUE SENT INSIDE SWABS  Final   Gram Stain   Final    FEW WBC PRESENT,BOTH PMN AND MONONUCLEAR ABUNDANT GRAM POSITIVE COCCI ABUNDANT GRAM NEGATIVE RODS ABUNDANT GRAM POSITIVE RODS    Culture   Final    ABUNDANT PROTEUS MIRABILIS ABUNDANT ACINETOBACTER CALCOACETICUS/BAUMANNII COMPLEX MULTI-DRUG RESISTANT ORGANISM MODERATE METHICILLIN RESISTANT STAPHYLOCOCCUS AUREUS MIXED ANAEROBIC FLORA PRESENT.  CALL LAB IF FURTHER IID REQUIRED. RESULT CALLED TO, READ BACK BY AND  VERIFIED WITH: K BLUM,RN AT 1142 02/24/17 BY L BENFIELD CONCERNING RESISTANT ACINETOBACTER WILL REFER ISOLATE TO [facility] AND [facility] FOR ADDITIONAL SENSITIVITIES Performed at [facility], [place] [address] [place], [place] [address]    Report Status PENDING  Incomplete   Organism ID, Bacteria PROTEUS MIRABILIS  Final  Organism ID, Bacteria METHICILLIN RESISTANT STAPHYLOCOCCUS AUREUS  Final   Organism ID, Bacteria ACINETOBACTER CALCOACETICUS/BAUMANNII COMPLEX  Final      Susceptibility   Acinetobacter calcoaceticus/baumannii complex - MIC*    CEFTAZIDIME >=64 RESISTANT Resistant     CEFTRIAXONE >=64 RESISTANT Resistant     CIPROFLOXACIN >=4 RESISTANT Resistant     GENTAMICIN >=16 RESISTANT Resistant     IMIPENEM >=16 RESISTANT Resistant     PIP/TAZO >=128 RESISTANT Resistant     TRIMETH/SULFA >=320 RESISTANT Resistant     CEFEPIME >=64 RESISTANT Resistant     AMPICILLIN/SULBACTAM RESISTANT Resistant     LEVOFLOXACIN Value in next row Intermediate      INTERMEDIATE4    * ABUNDANT ACINETOBACTER CALCOACETICUS/BAUMANNII COMPLEX   Methicillin resistant staphylococcus aureus - MIC*    CIPROFLOXACIN Value in next row Resistant      INTERMEDIATE4    ERYTHROMYCIN Value in next row Sensitive      INTERMEDIATE4    GENTAMICIN Value in next row Sensitive      INTERMEDIATE4    OXACILLIN Value in next row Resistant      INTERMEDIATE4    TETRACYCLINE Value in next row Sensitive      INTERMEDIATE4    VANCOMYCIN Value in next row Sensitive      INTERMEDIATE4    TRIMETH/SULFA Value in next row Sensitive      INTERMEDIATE4    CLINDAMYCIN Value in next row Sensitive      INTERMEDIATE4    RIFAMPIN Value in next row Sensitive      INTERMEDIATE4    Inducible Clindamycin Value in next row Sensitive      INTERMEDIATE4    * MODERATE METHICILLIN RESISTANT STAPHYLOCOCCUS AUREUS   Proteus mirabilis - MIC*    AMPICILLIN Value in next row Sensitive      INTERMEDIATE4    CEFAZOLIN Value in  next row Sensitive      INTERMEDIATE4    CEFEPIME Value in next row Sensitive      INTERMEDIATE4    CEFTAZIDIME Value in next row Sensitive      INTERMEDIATE4    CEFTRIAXONE Value in next row Sensitive      INTERMEDIATE4    CIPROFLOXACIN Value in next row Sensitive      INTERMEDIATE4    GENTAMICIN Value in next row Sensitive      INTERMEDIATE4    IMIPENEM Value in next row Sensitive      INTERMEDIATE4    TRIMETH/SULFA Value in next row Sensitive      INTERMEDIATE4    AMPICILLIN/SULBACTAM Value in next row Sensitive      INTERMEDIATE4    PIP/TAZO Value in next row Sensitive      INTERMEDIATE4    * ABUNDANT PROTEUS MIRABILIS  Aerobic/Anaerobic Culture (surgical/deep wound)     Status: None   Collection Time: 02/18/17 12:14 PM  Result Value Ref Range Status   Specimen Description FLUID SACRAL ULCER  Final   Special Requests NONE  Final   Gram Stain   Final    RARE WBC PRESENT,BOTH PMN AND MONONUCLEAR MODERATE GRAM POSITIVE COCCI MODERATE GRAM POSITIVE RODS Performed at [facility], [address] [place], [place] [address]    Culture   Final    MODERATE PROTEUS MIRABILIS FEW METHICILLIN RESISTANT STAPHYLOCOCCUS AUREUS MIXED ANAEROBIC FLORA PRESENT.  CALL LAB IF FURTHER IID REQUIRED.    Report Status 02/23/2017 FINAL  Final   Organism ID, Bacteria PROTEUS MIRABILIS  Final   Organism  ID, Bacteria METHICILLIN RESISTANT STAPHYLOCOCCUS AUREUS  Final      Susceptibility   Methicillin resistant staphylococcus aureus - MIC*    CIPROFLOXACIN >=8 RESISTANT Resistant     ERYTHROMYCIN 0.5 SENSITIVE Sensitive     GENTAMICIN <=0.5 SENSITIVE Sensitive     OXACILLIN >=4 RESISTANT Resistant     TETRACYCLINE <=1 SENSITIVE Sensitive     VANCOMYCIN 1 SENSITIVE Sensitive     TRIMETH/SULFA <=10 SENSITIVE Sensitive     CLINDAMYCIN <=0.25 SENSITIVE Sensitive     RIFAMPIN <=0.5 SENSITIVE Sensitive     Inducible Clindamycin NEGATIVE Sensitive     * FEW METHICILLIN RESISTANT  STAPHYLOCOCCUS AUREUS   Proteus mirabilis - MIC*    AMPICILLIN <=2 SENSITIVE Sensitive     CEFAZOLIN <=4 SENSITIVE Sensitive     CEFEPIME <=1 SENSITIVE Sensitive     CEFTAZIDIME <=1 SENSITIVE Sensitive     CEFTRIAXONE <=1 SENSITIVE Sensitive     CIPROFLOXACIN <=0.25 SENSITIVE Sensitive     GENTAMICIN <=1 SENSITIVE Sensitive     IMIPENEM 2 SENSITIVE Sensitive     TRIMETH/SULFA <=20 SENSITIVE Sensitive     AMPICILLIN/SULBACTAM <=2 SENSITIVE Sensitive     PIP/TAZO <=4 SENSITIVE Sensitive     * MODERATE PROTEUS MIRABILIS    Studies/Results: No results found.    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 Excell Seltzer of skin, subcutaneous tissue and periosteum left ischial decubitus ulcer--> cultures which yieled Proteus, MDR Acinetobacter and MRSA. Discussed with Dr. Excell Seltzer and her infection did extend to periosteum though he did not send cultures of bone itself he is very much concerned for osteomyelitis.   Cultures have yielded Proteus (fairly S), MRSA  (tetracycline S) and MDR Acinetobacter R to all antibiotics tested so far including now tetracycline, minocycline, and rifampin  Tests to [facility] for : Polymyxin, tygacil are still pending and I checked with Micro and they are not back yet, Micro believes tygacil S may be back this weekend but polymyxin back later since it  is sent out by [facility] to another lab  She had hives  with polymyxin infusion  Continue daptomycin, but change levaquin to ceftazidime to cover the proteus but not the acinetobacter since I am worried about breeding further R to the FQ in the acinetobacter  I had extensive discussion with pt and her mom and patient would prefer to await tygacil S data before embarking on ordeal of going to ICU for desensitzation and this makes sense because she is clinically completely stable.   At present I am still awaiting the results oft he tygacil S but I am skeptical that this drug will be ideal given prior experience with it.   If it is active we could also consider trying to get one of the newer tetracylines such as Eravacycline or Omadacycline  If Acinetbacter is R to tygacil then we will  have to go with desensitization route.  I spent greater than 35  minutes with the patient including greater than 50% of time in face to face counsel of the patient and her mother re antibiotic options, changes made today and in coordination of  her care with pharmacy.  Dr. Linus Salmons is available this weekend for questions.       LOS: 13 days   Alcide Evener 03/01/2017, 10:56 AM

## 2017-03-02 NOTE — Progress Notes (Signed)
PROGRESS NOTE    [name]  JOI:[id number] DOB: 08-Jul-1980 DOA: 02/13/2017 PCP: Lennie Odor, PA-C   Brief Narrative: 36 year old female with history of transverse myelitis, paraplegic bedbound, chronic sacral decub with a ulcer who comes in with foul-smelling discharge consistent with osteomyelitis.  Patient underwent surgical debridement and cultures growing resistant bacteria.  On antibiotics by infectious disease.  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  Underwent surgical debridement on November 19 by general surgery.  Cultures growing Acinetobacter resistant to all antibiotic, pansensitive Proteus and MRSA.   -Patient is currently on oral Levaquin, iv daptomycin through PIcc line (placed on11/26 yb IR). Polymixin x1 on 11/26 , stoppped due to hives at iv site - evaluation ongoing for long-term antibiotics plan including MDR is acinectobacter resistant to all antibiotics tested, the organism is being sent to [facility] for further evaluation. -Continue supportive care. -follow ID recommendation     #History of multiple sclerosis and transverse myelitis, paraplegia:  -Bed to wheel chair bound Continue supportive care.   Continue Neurontin, baclofen, Lamictal    #Depression: Continue Zoloft.  Mood is stable.  #Chronic anemia: Folate /b12/tsh unremarkable Low iron, but avoid iron supplement in the setting of infection hgb 8, no indication for transfusion  Plan discussed with the patient and her mother at bedside.  DVT prophylaxis: Lovenox subcutaneous Code Status: Full code Family Communication: Patient's mother at bedside Disposition Plan: Currently admitted    Consultants:   Surgery  Infectious disease  Wound care  Procedures:  Surgical debridement of sacral decubiti on 11/19 PICC line placement by IR on 11/26  Antimicrobials:  fortaz 11/19-11/21 Meropenem from  11/21- 11/26 Minocycline from 11/26-11/28 polymycin on 11/26 IV daptomycin from 11/19- oral Levaquin from 11/26  Subjective:  Seen and examined at bedside. No fever, denies pain. No interval changes, she is frustrated about waiting for the culture result. Mother at bedside  Objective: Vitals:   03/01/17 1426 03/01/17 2147 03/02/17 0617 03/02/17 1500  BP: (!) 105/56 (!) 92/41 (!) 91/48 (!) 102/57  Pulse: 88 92 81 82  Resp: 18 18 16 16   Temp: 98.7 F (37.1 C) 98.8 F (37.1 C) 98.4 F (36.9 C) 99 F (37.2 C)  TempSrc: Oral Oral Oral Oral  SpO2: 100% 100% 98% 100%  Weight:      Height:        Intake/Output Summary (Last 24 hours) at 03/02/2017 2102 Last data filed at 03/02/2017 1800 Gross per 24 hour  Intake 1540 ml  Output 975 ml  Net 565 ml   Filed Weights   02/14/17 0057 02/27/17 1900  Weight: 59.7 kg (131 lb 9.8 oz) 60.8 kg (134 lb)    Examination:   General:  NAD  Cardiovascular: RRR  Respiratory: CTABL  Abdomen: Soft/ND/NT, positive BS, + urostomy  Musculoskeletal: No Edema, chronic spastic paraplegia with some sensation in lower extremities  Neuro: alert, oriented   Sacral wound s/p debridement, per general surgery no further debridement needed at this time       Extremities: Spastic lower extremities Psychiatry: Judgement and insight appear normal. Mood & affect appropriate.     Data Reviewed: I have personally reviewed following labs and imaging studies  CBC: No results for input(s): WBC, NEUTROABS, HGB, HCT, MCV, PLT in the last 168 hours. Basic Metabolic Panel: No results for input(s): NA, K, CL, CO2, GLUCOSE, BUN, CREATININE, CALCIUM, MG, PHOS in the last 168 hours. GFR: CrCl  cannot be calculated (This lab value cannot be used to calculate CrCl because it is not a number: <0.30). Liver Function Tests: No results for input(s): AST, ALT, ALKPHOS, BILITOT, PROT, ALBUMIN in the last 168 hours. No results for input(s): LIPASE, AMYLASE in  the last 168 hours. No results for input(s): AMMONIA in the last 168 hours. Coagulation Profile: No results for input(s): INR, PROTIME in the last 168 hours. Cardiac Enzymes: Recent Labs  Lab 02/25/17 0529  CKTOTAL 15*   BNP (last 3 results) No results for input(s): PROBNP in the last 8760 hours. HbA1C: No results for input(s): HGBA1C in the last 72 hours. CBG: No results for input(s): GLUCAP in the last 168 hours. Lipid Profile: No results for input(s): CHOL, HDL, LDLCALC, TRIG, CHOLHDL, LDLDIRECT in the last 72 hours. Thyroid Function Tests: No results for input(s): TSH, T4TOTAL, FREET4, T3FREE, THYROIDAB in the last 72 hours. Anemia Panel: No results for input(s): VITAMINB12, FOLATE, FERRITIN, TIBC, IRON, RETICCTPCT in the last 72 hours. Sepsis Labs: No results for input(s): PROCALCITON, LATICACIDVEN in the last 168 hours.  No results found for this or any previous visit (from the past 240 hour(s)).       Radiology Studies: No results found.      Scheduled Meds: . baclofen  20 mg Oral TID  . bisacodyl  10 mg Rectal Daily  . enoxaparin (LOVENOX) injection  40 mg Subcutaneous Q24H  . gabapentin  800 mg Oral TID  . lactose free nutrition  237 mL Oral TID WC  . lamoTRIgine  125 mg Oral Daily  . lubiprostone  24 mcg Oral Q breakfast  . pantoprazole  20 mg Oral Daily  . senna-docusate  1 tablet Oral BID  . sertraline  100 mg Oral Daily   Continuous Infusions: . 0.9 % NaCl with KCl 20 mEq / L 10 mL/hr at 03/01/17 1356  . cefTAZidime (FORTAZ)  IV Stopped (03/02/17 1435)  . DAPTOmycin (CUBICIN)  IV Stopped (03/02/17 1940)     LOS: 14 days    Florencia Reasons, MD PhD [facility] Pager [phone]  If 7PM-7AM, please contact night-coverage [email or web address] Password [email or web address] 03/02/2017, 9:02 PM

## 2017-03-03 LAB — CREATININE, SERUM: Creatinine, Ser: <0.3 mg/dL — ABNORMAL LOW (ref 0.44–1.00)

## 2017-03-03 NOTE — Progress Notes (Signed)
PROGRESS NOTE    [name]  NTZ:[id number] DOB: 1980/09/01 DOA: 02/13/2017 PCP: Lennie Odor, PA-C   Brief Narrative: 36 year old female with history of transverse myelitis, paraplegic bedbound, chronic sacral decub with a ulcer who comes in with foul-smelling discharge consistent with osteomyelitis.  Patient underwent surgical debridement and cultures growing resistant bacteria.  On antibiotics by infectious disease.  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  Underwent surgical debridement on November 19 by general surgery.  Cultures growing Acinetobacter resistant to all antibiotic, pansensitive Proteus and MRSA.   -Patient is currently on oral Levaquin, iv daptomycin through PIcc line (placed on11/26 yb IR). Polymixin x1 on 11/26 , stoppped due to hives at iv site - evaluation ongoing for long-term antibiotics plan including MDR is acinectobacter resistant to all antibiotics tested, the organism is being sent to [place] for further evaluation. -Continue supportive care. -follow ID recommendation     #History of multiple sclerosis and transverse myelitis, paraplegia:  -Bed to wheel chair bound Continue supportive care.   Continue Neurontin, baclofen, Lamictal    #Depression: Continue Zoloft.  Mood is stable.  #Chronic anemia: Folate /b12/tsh unremarkable Low iron, but avoid iron supplement in the setting of infection hgb 8, no indication for transfusion  Plan discussed with the patient and her mother at bedside.  DVT prophylaxis: Lovenox subcutaneous Code Status: Full code Family Communication: Patient's mother at bedside Disposition Plan: Currently admitted    Consultants:   Surgery  Infectious disease  Wound care  Procedures:  Surgical debridement of sacral decubiti on 11/19 PICC line placement by IR on 11/26  Antimicrobials:  fortaz 11/19-11/21 Meropenem from  11/21- 11/26 Minocycline from 11/26-11/28 polymycin on 11/26 IV daptomycin from 11/19- oral Levaquin from 11/26  Subjective:  Seen and examined at bedside. No fever, denies pain. No interval changes, she is anxious about the possibility of going to icu for desensitization procedure. I reassured her that we are still waiting for the final culture result. Hopefully she does not have to go through that procedure if other options are available pending final culture result Mother at bedside  Objective: Vitals:   03/02/17 0617 03/02/17 1500 03/02/17 2128 03/03/17 0633  BP: (!) 91/48 (!) 102/57 (!) 106/57 (!) 106/58  Pulse: 81 82 96 90  Resp: 16 16 16 16   Temp: 98.4 F (36.9 C) 99 F (37.2 C) 98.5 F (36.9 C) 98.8 F (37.1 C)  TempSrc: Oral Oral Oral Oral  SpO2: 98% 100% 100% 100%  Weight:      Height:        Intake/Output Summary (Last 24 hours) at 03/03/2017 0935 Last data filed at 03/03/2017 0636 Gross per 24 hour  Intake 987.2 ml  Output 1100 ml  Net -112.8 ml   Filed Weights   02/14/17 0057 02/27/17 1900  Weight: 59.7 kg (131 lb 9.8 oz) 60.8 kg (134 lb)    Examination:   General:  NAD  Cardiovascular: RRR  Respiratory: CTABL  Abdomen: Soft/ND/NT, positive BS, + urostomy  Musculoskeletal: No Edema, chronic spastic paraplegia with some sensation in lower extremities  Neuro: alert, oriented   Sacral wound s/p debridement, per general surgery no further debridement needed at this time       Extremities: Spastic lower extremities Psychiatry: Judgement and insight appear normal. Mood & affect appropriate.     Data Reviewed: I have personally reviewed following labs and imaging studies  CBC: No results for  input(s): WBC, NEUTROABS, HGB, HCT, MCV, PLT in the last 168 hours. Basic Metabolic Panel: Recent Labs  Lab 03/03/17 0456  CREATININE <0.30*   GFR: CrCl cannot be calculated (This lab value cannot be used to calculate CrCl because it is not a  number: <0.30). Liver Function Tests: No results for input(s): AST, ALT, ALKPHOS, BILITOT, PROT, ALBUMIN in the last 168 hours. No results for input(s): LIPASE, AMYLASE in the last 168 hours. No results for input(s): AMMONIA in the last 168 hours. Coagulation Profile: No results for input(s): INR, PROTIME in the last 168 hours. Cardiac Enzymes: Recent Labs  Lab 02/25/17 0529  CKTOTAL 15*   BNP (last 3 results) No results for input(s): PROBNP in the last 8760 hours. HbA1C: No results for input(s): HGBA1C in the last 72 hours. CBG: No results for input(s): GLUCAP in the last 168 hours. Lipid Profile: No results for input(s): CHOL, HDL, LDLCALC, TRIG, CHOLHDL, LDLDIRECT in the last 72 hours. Thyroid Function Tests: No results for input(s): TSH, T4TOTAL, FREET4, T3FREE, THYROIDAB in the last 72 hours. Anemia Panel: No results for input(s): VITAMINB12, FOLATE, FERRITIN, TIBC, IRON, RETICCTPCT in the last 72 hours. Sepsis Labs: No results for input(s): PROCALCITON, LATICACIDVEN in the last 168 hours.  No results found for this or any previous visit (from the past 240 hour(s)).       Radiology Studies: No results found.      Scheduled Meds: . baclofen  20 mg Oral TID  . bisacodyl  10 mg Rectal Daily  . enoxaparin (LOVENOX) injection  40 mg Subcutaneous Q24H  . gabapentin  800 mg Oral TID  . lactose free nutrition  237 mL Oral TID WC  . lamoTRIgine  125 mg Oral Daily  . lubiprostone  24 mcg Oral Q breakfast  . pantoprazole  20 mg Oral Daily  . senna-docusate  1 tablet Oral BID  . sertraline  100 mg Oral Daily   Continuous Infusions: . 0.9 % NaCl with KCl 20 mEq / L 10 mL/hr at 03/01/17 1356  . cefTAZidime (FORTAZ)  IV Stopped (03/03/17 4235)  . DAPTOmycin (CUBICIN)  IV Stopped (03/02/17 1940)     LOS: 15 days    Florencia Reasons, MD PhD [facility] Pager [phone]  If 7PM-7AM, please contact night-coverage [email or web address] Password [email or web address] 03/03/2017, 9:35 AM

## 2017-03-04 LAB — MISC LABCORP TEST (SEND OUT): LABCORP TEST CODE: 96388

## 2017-03-04 LAB — CK: CK TOTAL: 17 U/L — AB (ref 38–234)

## 2017-03-04 MED ORDER — TIGECYCLINE 50 MG IV SOLR
100.0000 mg | Freq: Once | INTRAVENOUS | Status: AC
  Administered 2017-03-04: 100 mg via INTRAVENOUS
  Filled 2017-03-04: qty 100

## 2017-03-04 MED ORDER — TIGECYCLINE 50 MG IV SOLR
50.0000 mg | Freq: Two times a day (BID) | INTRAVENOUS | Status: DC
  Administered 2017-03-05 – 2017-03-12 (×15): 50 mg via INTRAVENOUS
  Filled 2017-03-04 (×16): qty 100

## 2017-03-04 MED ORDER — LEVOFLOXACIN IN D5W 750 MG/150ML IV SOLN
750.0000 mg | INTRAVENOUS | Status: DC
  Administered 2017-03-04 – 2017-03-07 (×4): 750 mg via INTRAVENOUS
  Filled 2017-03-04 (×4): qty 150

## 2017-03-04 NOTE — Progress Notes (Signed)
Pharmacy Antibiotic Note  [name] is a 36 y.o. female admitted on 02/13/2017 with decubitus ulcer and early pelvic osteomyelitis s/p I&D.  Tissue from sacral ulcer culture growing Proteus mirabilis and Acinetobacer complex.  Pharmacy currently dosing daptomycin and patient on levofloxacin.  Orders 11/30 to change levofloxacin to ceftazidime.    Significant events: Res Acinetobacter Cx- susceptibility testing sent to - [facility] : resistant to Minocycline,TCN, Rifampin - [facility] for Polymixin, Tygacycline, results pending  - 11/26: Meropenem d/c (Acinetobacter resistance), Polymixin B IV x1, stopped due to hives at IV site - 11/28: Minocycline d/c, Acinetobacter resistance - 11/30: levofloxacin to ceftazidime  Plan: Continue Daptomycin 360mg  IV q24h (6mg /kg) (MRSA) Ceftazidime 2gm IV q8h CK weekly while on daptomycin- last 12/3 = 17, next due 12/10   Height: 5\' 3"  (160 cm) Weight: 134 lb (60.8 kg) IBW/kg (Calculated) : 52.4  Temp (24hrs), Avg:98.7 F (37.1 C), Min:98.7 F (37.1 C), Max:98.8 F (37.1 C)  Recent Labs  Lab 03/03/17 0456  CREATININE <0.30*    CrCl cannot be calculated (This lab value cannot be used to calculate CrCl because it is not a number: <0.30).    Allergies  Allergen Reactions  . Iodine Anaphylaxis  . Latex Anaphylaxis  . Other Anaphylaxis  . Polymyxin B Hives    Pt had hives in right arm with infusion of polymyxin on November 26th, 2018  . Shellfish-Derived Products Anaphylaxis    Reaction unknown  . Vancomycin Anaphylaxis  . Aspirin Other (See Comments)    Reaction unknown  . Strawberry Extract Hives and Rash   Antimicrobials this admission:  11/19 Ceftaz >> 11/21, 11/26 >> 11/26, 11/30 >> 11/21 merrem >> 11/26 11/19 Dapto >> 11/26 Levaquin >>11/30 11/26 Polymixin B >> 11/26, Hives 11/26 Minocycline >> 11/28   Dose adjustments this admission:   Microbiology results:  11/19 Sacral ulcer (soft tissue): abundant proteus  (pansensitive) and acinetobacter (resistant to all tested except intermediate to levaquin) and now mod MRSA 11/19 Sacral ulcer (fluid): mod proteus mirabilis - pansensitive, few MRSA, mixed anaerobic flora 11/15 MRSA PCR: negative 11/14 Wet prep: moderate WBC Res Acinetobacter Cx- susceptibility testing sent to - [facility] : resistant to Minocycline,TCN, Rifampin - [facility] for Polymixin, Tygacycline, pending  Thank you for allowing pharmacy to be a part of this patient's care.  Dolly Rias RPh 03/04/2017, 1:04 PM Pager [phone]

## 2017-03-04 NOTE — Progress Notes (Signed)
Patient ID: [name], female   DOB: 05-13-1980, 36 y.o.   MRN: [id number]  [facility] Surgery Progress Note  14 Days Post-Op  Subjective: CC-  Mother at bedside. No complaints. Tolerating dressing changes well. Cultures have been sent to [facility] for further testing and to help ID better recommend appropriate antibiotic regimen.  Objective: Vital signs in last 24 hours: Temp:  [98.7 F (37.1 C)-98.8 F (37.1 C)] 98.8 F (37.1 C) (12/03 0621) Pulse Rate:  [82-89] 82 (12/03 0621) Resp:  [18] 18 (12/03 0621) BP: (94-129)/(48-75) 94/61 (12/03 0621) SpO2:  [99 %-100 %] 100 % (12/03 0621) Last BM Date: 03/03/17  Intake/Output from previous day: 12/02 0701 - 12/03 0700 In: 1320 [P.O.:1080; I.V.:240] Out: 2300 [Urine:2300] Intake/Output this shift: Total I/O In: 120 [P.O.:120] Out: -   PE: Gen: Alert, NAD, pleasant HEENT: EOM's intact, pupils equal and round Pulm: effort normal Psych: A&Ox3     Lab Results:  No results for input(s): WBC, HGB, HCT, PLT in the last 72 hours. BMET Recent Labs    03/03/17 0456  CREATININE <0.30*   PT/INR No results for input(s): LABPROT, INR in the last 72 hours. CMP     Component Value Date/Time   NA 141 02/19/2017 0512   K 4.1 02/19/2017 0512   CL 104 02/19/2017 0512   CO2 28 02/19/2017 0512   GLUCOSE 92 02/19/2017 0512   BUN 10 02/19/2017 0512   CREATININE <0.30 (L) 03/03/2017 0456   CREATININE 0.41 (L) 03/22/2016 1205   CALCIUM 9.0 02/19/2017 0512   PROT 7.0 02/13/2017 1935   ALBUMIN 4.2 02/13/2017 1935   AST 16 02/13/2017 1935   ALT 11 (L) 02/13/2017 1935   ALKPHOS 110 02/13/2017 1935   BILITOT 0.6 02/13/2017 1935   GFRNONAA NOT CALCULATED 03/03/2017 0456   GFRAA NOT CALCULATED 03/03/2017 0456   Lipase     Component Value Date/Time   LIPASE 22 10/17/2016 1043       Studies/Results: No results found.  Anti-infectives: Anti-infectives (From admission, onward)   Start     Dose/Rate Route  Frequency Ordered Stop   03/01/17 1000  cefTAZidime (FORTAZ) 2 g in dextrose 5 % 50 mL IVPB     2 g 100 mL/hr over 30 Minutes Intravenous Every 8 hours 03/01/17 0920     02/26/17 1700  levofloxacin (LEVAQUIN) tablet 750 mg  Status:  Discontinued     750 mg Oral Daily 02/26/17 1520 03/01/17 0904   02/25/17 2330  polymyxin B 89.55 mg in dextrose 5 % 500 mL IVPB  Status:  Discontinued     1.5 mg/kg  59.7 kg 500 mL/hr over 1 Hours Intravenous Every 12 hours 02/25/17 1127 02/25/17 1442   02/25/17 1800  levofloxacin (LEVAQUIN) IVPB 750 mg  Status:  Discontinued     750 mg 100 mL/hr over 90 Minutes Intravenous Every 24 hours 02/25/17 1122 02/26/17 1520   02/25/17 1600  minocycline (MINOCIN,DYNACIN) capsule 100 mg  Status:  Discontinued     100 mg Oral 2 times daily 02/25/17 1442 02/27/17 1930   02/25/17 1230  polymyxin B 149.25 mg in dextrose 5 % 500 mL IVPB  Status:  Discontinued     2.5 mg/kg  59.7 kg 250 mL/hr over 2 Hours Intravenous  Once 02/25/17 1127 02/25/17 1442   02/25/17 1000  cefTAZidime (FORTAZ) 2 g in dextrose 5 % 50 mL IVPB  Status:  Discontinued     2 g 100 mL/hr over 30 Minutes Intravenous Every  8 hours 02/25/17 0942 02/25/17 1122   02/20/17 1600  meropenem (MERREM) 1 g in sodium chloride 0.9 % 100 mL IVPB  Status:  Discontinued     1 g 200 mL/hr over 30 Minutes Intravenous Every 8 hours 02/20/17 1456 02/25/17 0942   02/19/17 2200  metroNIDAZOLE (FLAGYL) tablet 500 mg  Status:  Discontinued     500 mg Oral Every 8 hours 02/19/17 2043 02/20/17 1430   02/18/17 1800  cefTAZidime (FORTAZ) 2 g in dextrose 5 % 50 mL IVPB  Status:  Discontinued     2 g 100 mL/hr over 30 Minutes Intravenous Every 8 hours 02/18/17 1631 02/20/17 1454   02/18/17 1800  DAPTOmycin (CUBICIN) 360 mg in sodium chloride 0.9 % IVPB     360 mg 214.4 mL/hr over 30 Minutes Intravenous Every 24 hours 02/18/17 1633         Assessment/Plan Multiple sclerosis/transverse myelitis and  paraplegia Constipation Chronic microcytic anemia/iron deficiency anemia  Left ischial decubitus ulcer S/pDebridement skin, subcutaneous tissue and periosteum left ischial decubitus ulcer11/19 Dr. Excell Seltzer -culturesgrewPROTEUS MIRABILIS, MRSA,ACINETOBACTER CALCOACETICUS/BAUMANNII COMPLEX - ID following  ID -fortaz 11/19>>11/21 restarted 11/26>>,merrem 11/21>>11/26, flagyl 11/20>>11/21,  daptomycin 11/19>> FEN -regular diet VTE -SCDs, lovenox Foley -none Follow up -wound clinic   Plan:  Wound continues to heal appropriately and does not need further surgical debridement. Continue BID wet to dry dressing changes. Antibiotics per infectious disease. General surgery will sign off, please call with concerns.    LOS: 16 days    Wellington Hampshire , [facility] 03/04/2017, 10:22 AM Pager: [phone] Consults: [phone] Mon-Fri 7:00 am-4:30 pm Sat-Sun 7:00 am-11:30 am

## 2017-03-04 NOTE — Progress Notes (Signed)
    [facility] for Infectious Disease    Date of Admission:  02/13/2017   Total days of antibiotics 15          ID: [name] is a 36 y.o. 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 Principal Problem:   Decubitus ulcer of left ischial area, stage III (HCC) Active Problems:   Paraplegia (HCC)   History of transverse myelitis   Decubitus ulcer   Osteomyelitis, pelvis (HCC)   Wound disruption   MRSA infection   Acinetobacter lwoffi infection   Proteus infection   Allergic reaction   MDR Acinetobacter baumannii infection    Subjective: Having nausea with new iv abtx  Medications:  . baclofen  20 mg Oral TID  . bisacodyl  10 mg Rectal Daily  . enoxaparin (LOVENOX) injection  40 mg Subcutaneous Q24H  . gabapentin  800 mg Oral TID  . lactose free nutrition  237 mL Oral TID WC  . lamoTRIgine  125 mg Oral Daily  . lubiprostone  24 mcg Oral Q breakfast  . pantoprazole  20 mg Oral Daily  . senna-docusate  1 tablet Oral BID  . sertraline  100 mg Oral Daily    Objective: Vital signs in last 24 hours: Temp:  [98.6 F (37 C)-98.8 F (37.1 C)] 98.6 F (37 C) (12/03 1301) Pulse Rate:  [80-89] 80 (12/03 1301) Resp:  [18] 18 (12/03 1301) BP: (94-129)/(48-75) 109/60 (12/03 1301) SpO2:  [99 %-100 %] 100 % (12/03 1301) Physical Exam  Constitutional:  oriented to person, place, and time. appears chronically ill and well-nourished. No distress.  HENT: [place]/AT, PERRLA, no scleral icterus Mouth/Throat: Oropharynx is clear and moist. No oropharyngeal exudate.  Cardiovascular: Normal rate, regular rhythm and normal heart sounds. Exam reveals no gallop and no friction rub.  No murmur heard.  Pulmonary/Chest: Effort normal and breath sounds normal. No respiratory distress.  has no wheezes.  Abdominal: Soft. Bowel sounds are normal.  exhibits no distension. There is no tenderness. Ostomy in RLQ Neurological: alert and oriented to person, place, and time. Lower extremity paraplegia Ext: thin  tapered fingers, mild clubbing Skin: Skin is warm and dry. No rash noted. No erythema.  Psychiatric: a normal mood and affect.  behavior is normal.   Lab Results Recent Labs    03/03/17 0456  CREATININE <0.30*    Microbiology: Acinetobacter tigecycline S. But otherwise resistant  Studies/Results: No results found.   Assessment/Plan: Pelvic osteomyelitis including pan sensitive proteus, MRSA, and MDRO/CRE acinetobacter - appears S tigecycline and I to levofloxacin. Will have her abtx changed to tigecycline, levo plus daptomycin to cover MRSA  Nausea= will premedicate her with anti emetics for her tigecycline infusion  Hx of drug rash = now resolved  Baxter Flattery [facility] Cell: [phone] Pager: [phone]  03/04/2017, 1:19 PM

## 2017-03-04 NOTE — Progress Notes (Signed)
Prevalon boots placed today bilaterally due to reddened areas on heels.

## 2017-03-04 NOTE — Progress Notes (Signed)
PROGRESS NOTE    [name]  BMW:[id number] DOB: 18-Dec-1980 DOA: 02/13/2017 PCP: Lennie Odor, PA-C   Brief Narrative: 36 year old female with history of transverse myelitis, paraplegic bedbound, chronic sacral decub with a ulcer who comes in with foul-smelling discharge consistent with osteomyelitis.  Patient underwent surgical debridement and cultures growing resistant bacteria.  On antibiotics by infectious disease.  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  Underwent surgical debridement on November 19 by general surgery.  Cultures growing Acinetobacter resistant to all antibiotic, pansensitive Proteus and MRSA.   -Patient is currently on oral Levaquin, iv daptomycin through PIcc line (placed on11/26 yb IR). Polymixin x1 on 11/26 , stoppped due to hives at iv site - evaluation ongoing for long-term antibiotics plan including MDR is acinectobacter resistant to all antibiotics tested, the organism is being sent to [facility] for further evaluation. -Continue supportive care. -follow ID recommendation     #History of multiple sclerosis and transverse myelitis, paraplegia:  -Bed to wheel chair bound Continue supportive care.   Continue Neurontin, baclofen, Lamictal  prevalon boot ordered for heal protection  #Depression: Continue Zoloft.  Mood is stable.  #Chronic anemia: Folate /b12/tsh unremarkable Low iron, but avoid iron supplement in the setting of infection hgb 8, no indication for transfusion  Plan discussed with the patient and her mother at bedside.  DVT prophylaxis: Lovenox subcutaneous Code Status: Full code Family Communication: Patient's mother at bedside Disposition Plan: Currently admitted    Consultants:   Surgery  Infectious disease  Wound care  Procedures:  Surgical debridement of sacral decubiti on 11/19 PICC line placement by IR on  11/26  Antimicrobials:  fortaz 11/19-11/21 Meropenem from 11/21- 11/26 Minocycline from 11/26-11/28 polymycin on 11/26 IV daptomycin from 11/19- oral Levaquin from 11/26  Subjective:  Seen and examined at bedside. No fever, denies pain. No interval changes, she is anxious about the possibility of going to icu for desensitization procedure. I reassured her that we are still waiting for the final culture result. Hopefully she does not have to go through that procedure if other options are available pending final culture result Mother at bedside  Objective: Vitals:   03/03/17 0633 03/03/17 1331 03/03/17 2254 03/04/17 0621  BP: (!) 106/58 129/75 (!) 94/48 94/61  Pulse: 90 85 89 82  Resp: 16 18 18 18   Temp: 98.8 F (37.1 C) 98.7 F (37.1 C) 98.7 F (37.1 C) 98.8 F (37.1 C)  TempSrc: Oral Oral Oral Oral  SpO2: 100% 99% 100% 100%  Weight:      Height:        Intake/Output Summary (Last 24 hours) at 03/04/2017 0846 Last data filed at 03/04/2017 2725 Gross per 24 hour  Intake 1320 ml  Output 2300 ml  Net -980 ml   Filed Weights   02/14/17 0057 02/27/17 1900  Weight: 59.7 kg (131 lb 9.8 oz) 60.8 kg (134 lb)    Examination:   General:  NAD  Cardiovascular: RRR  Respiratory: CTABL  Abdomen: Soft/ND/NT, positive BS, + urostomy  Musculoskeletal: No Edema, chronic spastic paraplegia with some sensation in lower extremities  Neuro: alert, oriented   Sacral wound s/p debridement, per general surgery no further debridement needed at this time       Extremities: Spastic lower extremities Psychiatry: Judgement and insight appear normal. Mood & affect appropriate.     Data Reviewed: I have personally reviewed following labs and imaging studies  CBC:  No results for input(s): WBC, NEUTROABS, HGB, HCT, MCV, PLT in the last 168 hours. Basic Metabolic Panel: Recent Labs  Lab 03/03/17 0456  CREATININE <0.30*   GFR: CrCl cannot be calculated (This lab value  cannot be used to calculate CrCl because it is not a number: <0.30). Liver Function Tests: No results for input(s): AST, ALT, ALKPHOS, BILITOT, PROT, ALBUMIN in the last 168 hours. No results for input(s): LIPASE, AMYLASE in the last 168 hours. No results for input(s): AMMONIA in the last 168 hours. Coagulation Profile: No results for input(s): INR, PROTIME in the last 168 hours. Cardiac Enzymes: Recent Labs  Lab 03/04/17 0628  CKTOTAL 17*   BNP (last 3 results) No results for input(s): PROBNP in the last 8760 hours. HbA1C: No results for input(s): HGBA1C in the last 72 hours. CBG: No results for input(s): GLUCAP in the last 168 hours. Lipid Profile: No results for input(s): CHOL, HDL, LDLCALC, TRIG, CHOLHDL, LDLDIRECT in the last 72 hours. Thyroid Function Tests: No results for input(s): TSH, T4TOTAL, FREET4, T3FREE, THYROIDAB in the last 72 hours. Anemia Panel: No results for input(s): VITAMINB12, FOLATE, FERRITIN, TIBC, IRON, RETICCTPCT in the last 72 hours. Sepsis Labs: No results for input(s): PROCALCITON, LATICACIDVEN in the last 168 hours.  No results found for this or any previous visit (from the past 240 hour(s)).       Radiology Studies: No results found.      Scheduled Meds: . baclofen  20 mg Oral TID  . bisacodyl  10 mg Rectal Daily  . enoxaparin (LOVENOX) injection  40 mg Subcutaneous Q24H  . gabapentin  800 mg Oral TID  . lactose free nutrition  237 mL Oral TID WC  . lamoTRIgine  125 mg Oral Daily  . lubiprostone  24 mcg Oral Q breakfast  . pantoprazole  20 mg Oral Daily  . senna-docusate  1 tablet Oral BID  . sertraline  100 mg Oral Daily   Continuous Infusions: . 0.9 % NaCl with KCl 20 mEq / L 10 mL/hr at 03/01/17 1356  . cefTAZidime (FORTAZ)  IV Stopped (03/04/17 6759)  . DAPTOmycin (CUBICIN)  IV 360 mg (03/03/17 1708)     LOS: 16 days    Florencia Reasons, MD PhD [facility] Pager [phone]  If 7PM-7AM, please contact  night-coverage [email or web address] Password [facility] 03/04/2017, 8:46 AM

## 2017-03-05 ENCOUNTER — Inpatient Hospital Stay (HOSPITAL_COMMUNITY): Payer: Medicaid Other

## 2017-03-05 DIAGNOSIS — T7840XS Allergy, unspecified, sequela: Secondary | ICD-10-CM

## 2017-03-05 LAB — MISC LABCORP TEST (SEND OUT)
LABCORP TEST CODE: 96388
LabCorp test name: 1

## 2017-03-05 MED ORDER — PROCHLORPERAZINE EDISYLATE 5 MG/ML IJ SOLN
5.0000 mg | Freq: Once | INTRAMUSCULAR | Status: AC
  Administered 2017-03-05: 5 mg via INTRAVENOUS
  Filled 2017-03-05: qty 2

## 2017-03-05 NOTE — Progress Notes (Signed)
Patient actively nauseous and vomiting and decided  to hold next dose of antibiotic because "it make her sick" .

## 2017-03-05 NOTE — Progress Notes (Signed)
Gave pt Zofran prior to giving her antibiotic, Tygacil.  She also made sure to eat before it was given.  So far the pt has tolerated it without any nausea and vomiting.  Will continue to monitor.

## 2017-03-05 NOTE — Progress Notes (Signed)
PROGRESS NOTE    [name]  WNI:[id number] DOB: March 13, 1981 DOA: 02/13/2017 PCP: Lennie Odor, PA-C   Brief Narrative: 36 year old female with history of transverse myelitis, paraplegic bedbound, chronic sacral decub with a ulcer who comes in with foul-smelling discharge consistent with osteomyelitis.  Patient underwent surgical debridement and cultures growing resistant bacteria.  On antibiotics by infectious disease.  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  Underwent surgical debridement on November 19 by general surgery.  Cultures growing Acinetobacter resistant to all antibiotic, pansensitive Proteus and MRSA.   -Patient is currently on oral Levaquin, iv daptomycin through PIcc line (placed on11/26 yb IR) and tigecycline -Continue supportive care. -follow ID recommendation   N/V:  Likely Side effect from tigecycline   kub + constipation, no acute findings antiemetics   #History of multiple sclerosis and transverse myelitis, paraplegia:  -Bed to wheel chair bound Continue supportive care.   Continue Neurontin, baclofen, Lamictal  prevalon boot ordered for heal protection  #Depression: Continue Zoloft.  Mood is stable.  #Chronic anemia: Folate /b12/tsh unremarkable Low iron, but avoid iron supplement in the setting of infection hgb 8, no indication for transfusion   Plan discussed with the patient and her mother at bedside.  DVT prophylaxis: Lovenox subcutaneous Code Status: Full code Family Communication: Patient's mother at bedside Disposition Plan: need ID clearance for discharge    Consultants:   Surgery  Infectious disease  Wound care  Procedures:  Surgical debridement of sacral decubiti on 11/19 PICC line placement by IR on 11/26  Antimicrobials:  IV daptomycin from 11/19- oral Levaquin from 11/26 tigacycline  Subjective:  She report vomiting last night, wonder  if her vomiting is from the new abx that started from yesterday/she denies ab pain, no n/v this am, she is trying to order lunch. Her last bm was two days ago. Mother at bedside  Objective: Vitals:   03/04/17 1301 03/04/17 2108 03/05/17 0047 03/05/17 0543  BP: 109/60 (!) 102/51  107/62  Pulse: 80 78  85  Resp: 18 18  18   Temp: 98.6 F (37 C) 97.7 F (36.5 C) 98.1 F (36.7 C) 98.3 F (36.8 C)  TempSrc: Oral Oral Oral Oral  SpO2: 100% 100%  100%  Weight:      Height:        Intake/Output Summary (Last 24 hours) at 03/05/2017 1304 Last data filed at 03/05/2017 1015 Gross per 24 hour  Intake 714.4 ml  Output 1275 ml  Net -560.6 ml   Filed Weights   02/14/17 0057 02/27/17 1900  Weight: 59.7 kg (131 lb 9.8 oz) 60.8 kg (134 lb)    Examination:   General:  NAD  Cardiovascular: RRR  Respiratory: CTABL  Abdomen: Soft/ND/NT, positive BS, + urostomy  Musculoskeletal: No Edema, chronic spastic paraplegia with some sensation in lower extremities  Neuro: alert, oriented   Sacral wound s/p debridement, per general surgery no further debridement needed at this time       Extremities: Spastic lower extremities Psychiatry: Judgement and insight appear normal. Mood & affect appropriate.     Data Reviewed: I have personally reviewed following labs and imaging studies  CBC: No results for input(s): WBC, NEUTROABS, HGB, HCT, MCV, PLT in the last 168 hours. Basic Metabolic Panel: Recent Labs  Lab 03/03/17 0456  CREATININE <0.30*   GFR: CrCl cannot be calculated (This lab value cannot be used to calculate CrCl because it is not a number: <0.30).  Liver Function Tests: No results for input(s): AST, ALT, ALKPHOS, BILITOT, PROT, ALBUMIN in the last 168 hours. No results for input(s): LIPASE, AMYLASE in the last 168 hours. No results for input(s): AMMONIA in the last 168 hours. Coagulation Profile: No results for input(s): INR, PROTIME in the last 168 hours. Cardiac  Enzymes: Recent Labs  Lab 03/04/17 0628  CKTOTAL 17*   BNP (last 3 results) No results for input(s): PROBNP in the last 8760 hours. HbA1C: No results for input(s): HGBA1C in the last 72 hours. CBG: No results for input(s): GLUCAP in the last 168 hours. Lipid Profile: No results for input(s): CHOL, HDL, LDLCALC, TRIG, CHOLHDL, LDLDIRECT in the last 72 hours. Thyroid Function Tests: No results for input(s): TSH, T4TOTAL, FREET4, T3FREE, THYROIDAB in the last 72 hours. Anemia Panel: No results for input(s): VITAMINB12, FOLATE, FERRITIN, TIBC, IRON, RETICCTPCT in the last 72 hours. Sepsis Labs: No results for input(s): PROCALCITON, LATICACIDVEN in the last 168 hours.  No results found for this or any previous visit (from the past 240 hour(s)).       Radiology Studies: No results found.      Scheduled Meds: . baclofen  20 mg Oral TID  . bisacodyl  10 mg Rectal Daily  . enoxaparin (LOVENOX) injection  40 mg Subcutaneous Q24H  . gabapentin  800 mg Oral TID  . lactose free nutrition  237 mL Oral TID WC  . lamoTRIgine  125 mg Oral Daily  . lubiprostone  24 mcg Oral Q breakfast  . pantoprazole  20 mg Oral Daily  . senna-docusate  1 tablet Oral BID  . sertraline  100 mg Oral Daily   Continuous Infusions: . 0.9 % NaCl with KCl 20 mEq / L 10 mL/hr at 03/04/17 1800  . DAPTOmycin (CUBICIN)  IV Stopped (03/04/17 2042)  . levofloxacin (LEVAQUIN) IV Stopped (03/04/17 1545)  . tigecycline (TYGACIL) IVPB       LOS: 17 days    Florencia Reasons, MD PhD [facility] Pager [phone]  If 7PM-7AM, please contact night-coverage [email or web address] Password [email or web address] 03/05/2017, 1:04 PM

## 2017-03-05 NOTE — Progress Notes (Signed)
Nutrition Follow-up  INTERVENTION:   -Continue Boost Plus chocolate TID- Each supplement provides 360kcal and 14g protein.   -RD will continue to monitor  NUTRITION DIAGNOSIS:   Increased nutrient needs related to wound healing as evidenced by estimated needs.  Ongoing.  GOAL:   Patient will meet greater than or equal to 90% of their needs  Progressing.  MONITOR:   PO intake, Supplement acceptance, Labs, Weight trends, Skin, I & O's  ASSESSMENT:   36 y.o. female with medical history significant of MS, tranverse myelitis  bedbound state with decub wound that has been healing well for the past year comes in after her mom noticing the wound has gotten bigger in the last day or so.  She only has wound care nurse come out to her home once a month.  No fevers.  No n/v/d.  Pt referred for admission for possible need of wound debriedement.  Pt was eating well, 75-100% of meals prior to today. Pt developed N/V. Pt has been consuming on average ~1 Boost a day.  Pt's weight last recorded 11/28.   Labs reviewed. Medications: Protonix tablet daily, Senokot-S tablet BID  Diet Order:  Diet - low sodium heart healthy Diet regular Room service appropriate? Yes; Fluid consistency: Thin  EDUCATION NEEDS:   Education needs have been addressed  Skin:  Skin Assessment: Skin Integrity Issues: Skin Integrity Issues:: Stage II, Other (Comment) Stage II: sacrum Other: buttocks wound  Last BM:  11/15  Height:   Ht Readings from Last 1 Encounters:  02/14/17 5\' 3"  (1.6 m)    Weight:   Wt Readings from Last 1 Encounters:  02/27/17 134 lb (60.8 kg)    Ideal Body Weight:  49.7 kg  BMI:  Body mass index is 23.74 kg/m.  Estimated Nutritional Needs:   Kcal:  1500-1700  Protein:  70-80g  Fluid:  1.7L/day  Clayton Bibles, MS, RD, LDN [place] Diet[facility] Pager: [phone] After Hours Pager: [phone]

## 2017-03-05 NOTE — Progress Notes (Signed)
[facility] for Infectious Disease    Date of Admission:  02/13/2017   Total days of antibiotics 16          ID: [name] is a 36 y.o. 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 Principal Problem:   Decubitus ulcer of left ischial area, stage III ([place]) Active Problems:   Paraplegia (HCC)   History of transverse myelitis   Decubitus ulcer   Osteomyelitis, pelvis (HCC)   Wound disruption   MRSA infection   Acinetobacter lwoffi infection   Proteus infection   Allergic reaction   MDR Acinetobacter baumannii infection    Subjective: Patient reports having nausea and vomiting all last night, but today is improved  She reports that she usually has BM every 2-3 days  Seen by general surgery where they assessed the patient and has good granulation tissue and does not need further debridement. Just wet to dry dressing changes  Medications:  . baclofen  20 mg Oral TID  . bisacodyl  10 mg Rectal Daily  . enoxaparin (LOVENOX) injection  40 mg Subcutaneous Q24H  . gabapentin  800 mg Oral TID  . lactose free nutrition  237 mL Oral TID WC  . lamoTRIgine  125 mg Oral Daily  . lubiprostone  24 mcg Oral Q breakfast  . pantoprazole  20 mg Oral Daily  . senna-docusate  1 tablet Oral BID  . sertraline  100 mg Oral Daily    Objective: Vital signs in last 24 hours: Temp:  [97.7 F (36.5 C)-98.3 F (36.8 C)] 97.9 F (36.6 C) (12/04 1423) Pulse Rate:  [78-93] 93 (12/04 1423) Resp:  [18] 18 (12/04 1423) BP: (102-107)/(51-62) 104/60 (12/04 1423) SpO2:  [100 %] 100 % (12/04 1423) Physical Exam  Constitutional:  oriented to person, place, and time. appears chronically ill and well-nourished. No distress.  HENT: [place]/AT, PERRLA, no scleral icterus Mouth/Throat: Oropharynx is clear and moist. No oropharyngeal exudate.  Cardiovascular: Normal rate, regular rhythm and normal heart sounds. Exam reveals no gallop and no friction rub.  No murmur heard.  Pulmonary/Chest: Effort normal and breath  sounds normal. No respiratory distress.  has no wheezes.  Abdominal: Soft. Bowel sounds are normal.  exhibits no distension. There is no tenderness. Ostomy in RLQ Neurological: alert and oriented to person, place, and time. Lower extremity paraplegia Ext: thin tapered fingers, mild clubbing Skin: Skin is warm and dry. No rash noted. No erythema.  Psychiatric: a normal mood and affect.  behavior is normal.   Lab Results Recent Labs    03/03/17 0456  CREATININE <0.30*    Microbiology: Acinetobacter tigecycline S. But otherwise resistant  Studies/Results: Dg Abd 1 View  Result Date: 03/05/2017 CLINICAL DATA:  Patient has experience nausea and vomiting last night and this morning. Patient is complaining of constipation and abdominal distention. The patient has multiple scleroses and paraplegia. EXAM: ABDOMEN - 1 VIEW COMPARISON:  Abdominal and pelvic CT scan of November 15, 2016 FINDINGS: The stool burden within the transverse, descending, and rectosigmoid portions of the colon is increased. There is no small bowel obstructive pattern. There surgical clips in the gallbladder fossa. There are no abnormal soft tissue calcifications. The bony structures exhibit no acute abnormalities. IMPRESSION: Increased colonic stool burden is consistent with constipation in the appropriate clinical setting. Otherwise no acute intra-abdominal abnormality is observed. Electronically Signed   By: David  Martinique M.D.   On: 03/05/2017 13:56     Assessment/Plan: Pelvic osteomyelitis including pan sensitive proteus, MRSA, and  MDRO/CRE acinetobacter - appears S tigecycline and I to levofloxacin. Will have her abtx changed to tigecycline, levo plus daptomycin to cover MRSA  Nausea with vomiting= premedicate her with anti emetics for her tigecycline infusion and may need scheduled again after infusion  Hx of drug rash = now resolved  Baxter Flattery [facility] for Infectious Diseases Cell: [phone] Pager:  [phone]  03/05/2017, 3:02 PM

## 2017-03-06 DIAGNOSIS — A499 Bacterial infection, unspecified: Secondary | ICD-10-CM

## 2017-03-06 DIAGNOSIS — R112 Nausea with vomiting, unspecified: Secondary | ICD-10-CM

## 2017-03-06 DIAGNOSIS — G43A0 Cyclical vomiting, not intractable: Secondary | ICD-10-CM

## 2017-03-06 LAB — COMPREHENSIVE METABOLIC PANEL
ALK PHOS: 67 U/L (ref 38–126)
ALT: 10 U/L — AB (ref 14–54)
AST: 12 U/L — AB (ref 15–41)
Albumin: 3.4 g/dL — ABNORMAL LOW (ref 3.5–5.0)
Anion gap: 6 (ref 5–15)
BILIRUBIN TOTAL: 0.4 mg/dL (ref 0.3–1.2)
BUN: 17 mg/dL (ref 6–20)
CO2: 26 mmol/L (ref 22–32)
Calcium: 8.7 mg/dL — ABNORMAL LOW (ref 8.9–10.3)
Chloride: 107 mmol/L (ref 101–111)
Glucose, Bld: 94 mg/dL (ref 65–99)
Potassium: 3.3 mmol/L — ABNORMAL LOW (ref 3.5–5.1)
Sodium: 139 mmol/L (ref 135–145)
TOTAL PROTEIN: 5.7 g/dL — AB (ref 6.5–8.1)

## 2017-03-06 LAB — CBC WITH DIFFERENTIAL/PLATELET
Basophils Absolute: 0 10*3/uL (ref 0.0–0.1)
Basophils Relative: 0 %
EOS PCT: 2 %
Eosinophils Absolute: 0.1 10*3/uL (ref 0.0–0.7)
HEMATOCRIT: 25.4 % — AB (ref 36.0–46.0)
Hemoglobin: 7.5 g/dL — ABNORMAL LOW (ref 12.0–15.0)
Lymphocytes Relative: 27 %
Lymphs Abs: 1.2 10*3/uL (ref 0.7–4.0)
MCH: 20.8 pg — ABNORMAL LOW (ref 26.0–34.0)
MCHC: 29.5 g/dL — ABNORMAL LOW (ref 30.0–36.0)
MCV: 70.6 fL — AB (ref 78.0–100.0)
MONOS PCT: 7 %
Monocytes Absolute: 0.3 10*3/uL (ref 0.1–1.0)
NEUTROS PCT: 64 %
Neutro Abs: 3 10*3/uL (ref 1.7–7.7)
Platelets: 198 10*3/uL (ref 150–400)
RBC: 3.6 MIL/uL — AB (ref 3.87–5.11)
RDW: 19.6 % — ABNORMAL HIGH (ref 11.5–15.5)
WBC: 4.6 10*3/uL (ref 4.0–10.5)

## 2017-03-06 LAB — MAGNESIUM: MAGNESIUM: 1.8 mg/dL (ref 1.7–2.4)

## 2017-03-06 MED ORDER — POTASSIUM CHLORIDE CRYS ER 20 MEQ PO TBCR
40.0000 meq | EXTENDED_RELEASE_TABLET | Freq: Every day | ORAL | Status: AC
  Administered 2017-03-06 – 2017-03-08 (×3): 40 meq via ORAL
  Filled 2017-03-06 (×3): qty 2

## 2017-03-06 MED ORDER — FERROUS SULFATE 325 (65 FE) MG PO TABS
325.0000 mg | ORAL_TABLET | Freq: Two times a day (BID) | ORAL | Status: DC
  Administered 2017-03-06: 325 mg via ORAL
  Filled 2017-03-06 (×2): qty 1

## 2017-03-06 NOTE — Progress Notes (Signed)
[facility] for Infectious Disease    Date of Admission:  02/13/2017   Total days of antibiotics 17          ID: [name] is a 36 y.o. 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 She had just had graham crackers, and full stomach, partially laying flat. Denies throat being scratchy or fullness of face/lips, no rash to body. She was concern that she was getting  SE like she did to polymixcin    Medications:  . baclofen  20 mg Oral TID  . bisacodyl  10 mg Rectal Daily  . enoxaparin (LOVENOX) injection  40 mg Subcutaneous Q24H  . gabapentin  800 mg Oral TID  . lactose free nutrition  237 mL Oral TID WC  . lamoTRIgine  125 mg Oral Daily  . lubiprostone  24 mcg Oral Q breakfast  . pantoprazole  20 mg Oral Daily  . potassium chloride  40 mEq Oral Daily  . senna-docusate  1 tablet Oral BID  . sertraline  100 mg Oral Daily    Objective: Vital signs in last 24 hours: Temp:  [97.9 F (36.6 C)-98.9 F (37.2 C)] 98.9 F (37.2 C) (12/05 0644) Pulse Rate:  [80-93] 92 (12/05 0644) Resp:  [14-18] 14 (12/05 0644) BP: (95-127)/(53-80) 102/53 (12/05 0644) SpO2:  [100 %] 100 % (12/05 0644) Physical Exam  Constitutional:  oriented to person, place, and time. appears chronically ill and well-nourished. No distress.  HENT: [place]/AT, PERRLA, no scleral icterus, pale conjunctiva Mouth/Throat: Oropharynx is clear and moist. No oropharyngeal exudate. No signs of thrush Cardiovascular:  Normal rate, regular rhythm and normal heart sounds. Exam reveals no gallop and no friction rub.  No murmur heard.  Pulmonary/Chest: Effort normal and breath sounds normal. No respiratory distress.  has no wheezes.  Abdominal: Soft. Bowel sounds are normal. Mildly distension. There is no tenderness. Ostomy in RLQ Neurological: alert and oriented to person, place, and time. Lower extremity paraplegia Ext: thin tapered fingers, mild clubbing Skin: Skin is warm and dry. No rash noted. No erythema.  Psychiatric: a normal mood and affect.  behavior is normal.   Lab Results Recent Labs    03/06/17 0420  WBC 4.6  HGB 7.5*  HCT 25.4*  NA 139  K 3.3*  CL 107  CO2 26  BUN 17  CREATININE <0.30*    Microbiology: Acinetobacter tigecycline S. But otherwise resistant  Studies/Results: Dg Abd 1 View  Result Date: 03/05/2017 CLINICAL DATA:  Patient has experience nausea and vomiting last night and this morning. Patient is complaining of constipation and abdominal distention. The patient has multiple scleroses and paraplegia. EXAM: ABDOMEN - 1 VIEW COMPARISON:  Abdominal and pelvic CT scan of November 15, 2016 FINDINGS: The stool burden within the transverse, descending, and rectosigmoid portions of the colon is increased. There is no small bowel obstructive pattern. There surgical clips in the gallbladder fossa. There are no abnormal soft tissue calcifications. The  bony structures exhibit no acute abnormalities. IMPRESSION: Increased colonic stool burden is consistent with constipation in the appropriate clinical setting. Otherwise no acute intra-abdominal abnormality is observed. Electronically Signed   By: David  Martinique M.D.   On: 03/05/2017 13:56     Assessment/Plan: Pelvic osteomyelitis including pan sensitive proteus, MRSA, and MDRO/CRE acinetobacter - appears S tigecycline and I to levofloxacin. Will have her abtx changed to tigecycline, levo plus daptomycin to cover MRSA.  Questionable side  effect to abtx = I am not completely convinced her symptoms are a drug reaction. I would like to pre treat her with anti-emetic, plus benadryl, ensure she is sitting up to see how she tolerates the next dose of tigecycline. We really don't have that many options for her and would like to see if tigeclycine can be tolerated. If she tolerates, then we will stop the daptomycin since tigecycline also covers RMSA  Nausea with vomiting= premedicate her with anti emetics for her tigecycline infusion   Hx of drug rash = now resolved  Constipation = she has BM Q2-3d. Currently received a laxative  Annslee Tercero, [facility] for Infectious Diseases Cell: [phone] Pager: [phone]  03/06/2017, 12:50 PM

## 2017-03-06 NOTE — Progress Notes (Addendum)
PROGRESS NOTE    DELICIA BERENS  BWI:[id number] DOB: Dec 19, 1980 DOA: 02/13/2017 PCP: Lennie Odor, PA-C   Brief Narrative: 36 year old female with history of transverse myelitis, paraplegic bedbound, chronic sacral decub with a ulcer who comes in with foul-smelling discharge consistent with osteomyelitis.  Patient underwent surgical debridement and cultures growing resistant bacteria.  On antibiotics by infectious disease.  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  Underwent surgical debridement on November 19.  Cultures growing Acinetobacter resistant to all bacteria, Proteus pansensitive and MRSA.  Patient is currently on Levaquin, daptomycin and tigecycline.  There was some concern about possible allergic reaction to Tygacil.  Plan to continue medication with Benadryl, and Zofran.  Patient denied any symptoms today.  ID consult appreciated. -Continue supportive care. -Patient has PICC line.  # Hypokalemia: replete KCL. Monitor labs  # anemia due to iron deficiency and anemia of chronic disease: Start oral iron.  No sign of bleeding.  Monitor CBC.  #History of multiple sclerosis and transverse myelitis, paraplegia: Continue supportive care.  Continue Neurontin, baclofen, Lamictal  #Possible allergic reaction to polymyxin.    #Depression: Continue Zoloft.  Mood is stable.  Plan discussed with the patient and her mother at bedside.  DVT prophylaxis: Lovenox subcutaneous Code Status: Full code Family Communication: Patient's mother at bedside Disposition Plan: Currently admitted    Consultants:   Surgery  Infectious disease  Procedures: Surgical debridement of sacral decubiti Antimicrobials: IV daptomycin, Levaquin and Tigecycline.  Subjective: Seen and examined at bedside.  Denied nausea, vomiting, chest pain, shortness of breath.  Objective: Vitals:   03/05/17 1423 03/05/17 1502 03/05/17  2050 03/06/17 0644  BP: 104/60 127/80 (!) 95/55 (!) 102/53  Pulse: 93 80 92 92  Resp: 18 17 16 14   Temp: 97.9 F (36.6 C) 98.6 F (37 C) 98.7 F (37.1 C) 98.9 F (37.2 C)  TempSrc: Axillary Oral Oral Oral  SpO2: 100% 100% 100% 100%  Weight:      Height:        Intake/Output Summary (Last 24 hours) at 03/06/2017 1538 Last data filed at 03/06/2017 0851 Gross per 24 hour  Intake 1057.2 ml  Output 800 ml  Net 257.2 ml   Filed Weights   02/14/17 0057 02/27/17 1900  Weight: 59.7 kg (131 lb 9.8 oz) 60.8 kg (134 lb)    Examination:  General exam: Stable, not in distress Respiratory system: Clear bilateral, respiratory for normal  cardiovascular system: Regular rate rhythm S1-S2 normal.  No pedal edema.. Gastrointestinal system: Abdomen soft, nontender, nondistended.  Bowel sounds positive.   Central nervous system: Alert and oriented.  Extremities: Spastic lower extremities Psychiatry: Judgement and insight appear normal. Mood & affect appropriate.     Data Reviewed: I have personally reviewed following labs and imaging studies  CBC: Recent Labs  Lab 03/06/17 0420  WBC 4.6  NEUTROABS 3.0  HGB 7.5*  HCT 25.4*  MCV 70.6*  PLT 638   Basic Metabolic Panel: Recent Labs  Lab 03/03/17 0456 03/06/17 0420  NA  --  139  K  --  3.3*  CL  --  107  CO2  --  26  GLUCOSE  --  94  BUN  --  17  CREATININE <0.30* <0.30*  CALCIUM  --  8.7*  MG  --  1.8   GFR: CrCl cannot be calculated (This lab value cannot be used to calculate CrCl because it is not a number: <0.30).  Liver Function Tests: Recent Labs  Lab 03/06/17 0420  AST 12*  ALT 10*  ALKPHOS 67  BILITOT 0.4  PROT 5.7*  ALBUMIN 3.4*   No results for input(s): LIPASE, AMYLASE in the last 168 hours. No results for input(s): AMMONIA in the last 168 hours. Coagulation Profile: No results for input(s): INR, PROTIME in the last 168 hours. Cardiac Enzymes: Recent Labs  Lab 03/04/17 0628  CKTOTAL 17*   BNP  (last 3 results) No results for input(s): PROBNP in the last 8760 hours. HbA1C: No results for input(s): HGBA1C in the last 72 hours. CBG: No results for input(s): GLUCAP in the last 168 hours. Lipid Profile: No results for input(s): CHOL, HDL, LDLCALC, TRIG, CHOLHDL, LDLDIRECT in the last 72 hours. Thyroid Function Tests: No results for input(s): TSH, T4TOTAL, FREET4, T3FREE, THYROIDAB in the last 72 hours. Anemia Panel: No results for input(s): VITAMINB12, FOLATE, FERRITIN, TIBC, IRON, RETICCTPCT in the last 72 hours. Sepsis Labs: No results for input(s): PROCALCITON, LATICACIDVEN in the last 168 hours.  No results found for this or any previous visit (from the past 240 hour(s)).       Radiology Studies: Dg Abd 1 View  Result Date: 03/05/2017 CLINICAL DATA:  Patient has experience nausea and vomiting last night and this morning. Patient is complaining of constipation and abdominal distention. The patient has multiple scleroses and paraplegia. EXAM: ABDOMEN - 1 VIEW COMPARISON:  Abdominal and pelvic CT scan of November 15, 2016 FINDINGS: The stool burden within the transverse, descending, and rectosigmoid portions of the colon is increased. There is no small bowel obstructive pattern. There surgical clips in the gallbladder fossa. There are no abnormal soft tissue calcifications. The bony structures exhibit no acute abnormalities. IMPRESSION: Increased colonic stool burden is consistent with constipation in the appropriate clinical setting. Otherwise no acute intra-abdominal abnormality is observed. Electronically Signed   By: David  Martinique M.D.   On: 03/05/2017 13:56        Scheduled Meds: . baclofen  20 mg Oral TID  . bisacodyl  10 mg Rectal Daily  . enoxaparin (LOVENOX) injection  40 mg Subcutaneous Q24H  . gabapentin  800 mg Oral TID  . lactose free nutrition  237 mL Oral TID WC  . lamoTRIgine  125 mg Oral Daily  . lubiprostone  24 mcg Oral Q breakfast  . pantoprazole  20 mg  Oral Daily  . potassium chloride  40 mEq Oral Daily  . senna-docusate  1 tablet Oral BID  . sertraline  100 mg Oral Daily   Continuous Infusions: . 0.9 % NaCl with KCl 20 mEq / L 10 mL/hr at 03/05/17 1800  . DAPTOmycin (CUBICIN)  IV Stopped (03/05/17 2458)  . levofloxacin (LEVAQUIN) IV 750 mg (03/06/17 1420)  . tigecycline (TYGACIL) IVPB 50 mg (03/06/17 1423)     LOS: 18 days    Kerman Pfost Tanna Furry, MD [facility] Pager [phone]  If 7PM-7AM, please contact night-coverage [email or web address] Password [facility] 03/06/2017, 3:38 PM

## 2017-03-07 ENCOUNTER — Inpatient Hospital Stay (HOSPITAL_COMMUNITY): Payer: Medicaid Other

## 2017-03-07 DIAGNOSIS — M868X8 Other osteomyelitis, other site: Secondary | ICD-10-CM

## 2017-03-07 DIAGNOSIS — K5900 Constipation, unspecified: Secondary | ICD-10-CM

## 2017-03-07 DIAGNOSIS — Z931 Gastrostomy status: Secondary | ICD-10-CM

## 2017-03-07 DIAGNOSIS — B9689 Other specified bacterial agents as the cause of diseases classified elsewhere: Secondary | ICD-10-CM

## 2017-03-07 DIAGNOSIS — B9562 Methicillin resistant Staphylococcus aureus infection as the cause of diseases classified elsewhere: Secondary | ICD-10-CM

## 2017-03-07 MED ORDER — LEVOFLOXACIN 750 MG PO TABS
750.0000 mg | ORAL_TABLET | Freq: Every day | ORAL | Status: DC
  Administered 2017-03-08 – 2017-03-12 (×5): 750 mg via ORAL
  Filled 2017-03-07 (×5): qty 1

## 2017-03-07 MED ORDER — ONDANSETRON 4 MG PO TBDP
4.0000 mg | ORAL_TABLET | Freq: Three times a day (TID) | ORAL | Status: DC | PRN
  Administered 2017-03-08 – 2017-03-12 (×9): 4 mg via ORAL
  Filled 2017-03-07 (×10): qty 1

## 2017-03-07 NOTE — Progress Notes (Signed)
Pharmacy Antibiotic Note  [name] is a 36 y.o. female admitted on 02/13/2017 with decubitus ulcer and early pelvic osteomyelitis s/p I&D.  Tissue from sacral ulcer culture growing Proteus mirabilis and Acinetobacer complex.  Pharmacy currently dosing daptomycin and patient on levofloxacin.  Orders 11/30 to change levofloxacin to ceftazidime.   Today, 03/07/2017 Day #4 effective therapy for MDR acinetobacter, D#18 effective antibiotics for MRSA  Last CK = 17 on 12/3  Renal: SCr remains < 0.3  WBC WNL  Questionable ADE to tigecycline but unlikely. To premedicate.   Significant events: Res Acinetobacter Cx- susceptibility testing sent to - [facility] : resistant to Minocycline,TCN, Rifampin - Tigecycline MIC = 1 - [facility] for Polymixin,   - 11/26: Meropenem d/c (Acinetobacter resistance), Polymixin B IV x1, stopped due to hives at IV site  Plan: Continue Daptomycin 360mg  IV q24h (6mg /kg) (MRSA) CK weekly while on daptomycin- last 12/3 = 17, next due 12/10 Contine Tigecycline and levofloxacin as per IR recs - premed with ondansetron (and diphenhydramine)  W/ tigecycline   Height: 5\' 3"  (160 cm) Weight: 134 lb (60.8 kg) IBW/kg (Calculated) : 52.4  Temp (24hrs), Avg:98.6 F (37 C), Min:98.3 F (36.8 C), Max:99 F (37.2 C)  Recent Labs  Lab 03/03/17 0456 03/06/17 0420  WBC  --  4.6  CREATININE <0.30* <0.30*    CrCl cannot be calculated (This lab value cannot be used to calculate CrCl because it is not a number: <0.30).    Allergies  Allergen Reactions  . Iodine Anaphylaxis  . Latex Anaphylaxis  . Other Anaphylaxis  . Polymyxin B Hives    Pt had hives in right arm with infusion of polymyxin on November 26th, 2018  . Shellfish-Derived Products Anaphylaxis    Reaction unknown  . Vancomycin Anaphylaxis  . Aspirin Other (See Comments)    Reaction unknown  . Strawberry Extract Hives and Rash    Antimicrobials this admission:  11/19 Ceftaz >> 11/21, 11/26 >>  11/26, 11/30 >> 12/3 11/21 merrem >> 11/26 11/19 Dapto >> 11/26 Levaquin >> 11/30, 12/3 >> 11/26 Polymixin B >> 11/26 x1, stopped during infusion, wheal x1 on forearm 11/26 Minocycline >> 11/28 12/3 tygacil >>  Microbiology results:  11/19 Sacral ulcer (soft tissue): abundant proteus (pansensitive) and acinetobacter (resistant to all tested except intermediate to levaquin) and moderate MRSA 11/19 Sacral ulcer (fluid): mod proteus mirabilis - pansensitive, few MRSA, mixed anaerobic flora 11/15 MRSA PCR: negative 11/14 Wet prep: moderate WBC Thank you for allowing pharmacy to be a part of this patient's care.  Doreene Eland, PharmD, BCPS.   Pager: [phone] 03/07/2017 10:08 AM
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
## 2017-03-07 NOTE — Progress Notes (Signed)
Patient c/o chest pain but unable to really describe it. Says may be  It's pressure.  VS taken and repositioned in bed.  Will notify oncall MD.

## 2017-03-07 NOTE — Progress Notes (Signed)
PROGRESS NOTE    [name]  YYQ:[id number] DOB: [birth year] DOA: 02/13/2017 PCP: Lennie Odor, PA-C   Brief Narrative: 36 year old female with history of transverse myelitis, paraplegic bedbound, chronic sacral decub with a ulcer who comes in with foul-smelling discharge consistent with osteomyelitis.  Patient underwent surgical debridement and cultures growing resistant bacteria.  On antibiotics by infectious disease.  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  Underwent surgical debridement on November 19.  Cultures growing Acinetobacter resistant to all bacteria, Proteus pansensitive and MRSA.  Patient is currently on Levaquin, daptomycin and tigecycline.  There was some concern about possible allergic reaction to Tygacil.  -Premedicated with Zofran and Benadryl before Tygacil, tolerating well.  Now waiting for IDs plan for long-term antibiotics.  Consult appreciated.  Recheck lab in the morning. -Patient has PICC line.  # Hypokalemia: replete KCL. Monitor labs  # anemia due to iron deficiency and anemia of chronic disease: Decline oral iron.  No sign of bleeding.  Monitor CBC.  #History of multiple sclerosis and transverse myelitis, paraplegia: Continue supportive care.  Continue Neurontin, baclofen, Lamictal  #Possible allergic reaction to polymyxin.    #Depression: Continue Zoloft.  Mood is stable.  Plan discussed with the patient and her mother at bedside.  DVT prophylaxis: Lovenox subcutaneous Code Status: Full code Family Communication: Patient's mother at bedside Disposition Plan: Currently admitted    Consultants:   Surgery  Infectious disease  Procedures: Surgical debridement of sacral decubiti Antimicrobials: IV daptomycin, Levaquin and Tigecycline.  Subjective: Seen and examined at bedside.  No new event.  Denied nausea vomiting chest pain shortness of  breath. Objective: Vitals:   03/07/17 0304 03/07/17 0552 03/07/17 0900 03/07/17 1353  BP: (!) 90/46 (!) 96/50 107/63 (!) 104/58  Pulse: 89 81 88 90  Resp: 16 18 17 17   Temp: 99 F (37.2 C) 98.3 F (36.8 C) 98.5 F (36.9 C) 98.5 F (36.9 C)  TempSrc: Oral Oral Oral Oral  SpO2: 100% 100% 99% 98%  Weight:      Height:        Intake/Output Summary (Last 24 hours) at 03/07/2017 1415 Last data filed at 03/07/2017 1000 Gross per 24 hour  Intake 854.37 ml  Output 975 ml  Net -120.63 ml   Filed Weights   02/14/17 0057 02/27/17 1900  Weight: 59.7 kg (131 lb 9.8 oz) 60.8 kg (134 lb)    Examination:  General exam: Lying in bed, not in distress Respiratory system: Clear bilateral, respiratory for normal cardiovascular system: Regular rate rhythm, S1-S2 normal.  No pedal edema. Gastrointestinal system: Abdomen soft, nontender, nondistended.  Bowel sounds positive.   Central nervous system: Alert and oriented.  Extremities: Spastic lower extremities Psychiatry: Judgement and insight appear normal. Mood & affect appropriate.     Data Reviewed: I have personally reviewed following labs and imaging studies  CBC: Recent Labs  Lab 03/06/17 0420  WBC 4.6  NEUTROABS 3.0  HGB 7.5*  HCT 25.4*  MCV 70.6*  PLT 888   Basic Metabolic Panel: Recent Labs  Lab 03/03/17 0456 03/06/17 0420  NA  --  139  K  --  3.3*  CL  --  107  CO2  --  26  GLUCOSE  --  94  BUN  --  17  CREATININE <0.30* <0.30*  CALCIUM  --  8.7*  MG  --  1.8   GFR: CrCl cannot be calculated (This lab value cannot be  used to calculate CrCl because it is not a number: <0.30). Liver Function Tests: Recent Labs  Lab 03/06/17 0420  AST 12*  ALT 10*  ALKPHOS 67  BILITOT 0.4  PROT 5.7*  ALBUMIN 3.4*   No results for input(s): LIPASE, AMYLASE in the last 168 hours. No results for input(s): AMMONIA in the last 168 hours. Coagulation Profile: No results for input(s): INR, PROTIME in the last 168  hours. Cardiac Enzymes: Recent Labs  Lab 03/04/17 0628  CKTOTAL 17*   BNP (last 3 results) No results for input(s): PROBNP in the last 8760 hours. HbA1C: No results for input(s): HGBA1C in the last 72 hours. CBG: No results for input(s): GLUCAP in the last 168 hours. Lipid Profile: No results for input(s): CHOL, HDL, LDLCALC, TRIG, CHOLHDL, LDLDIRECT in the last 72 hours. Thyroid Function Tests: No results for input(s): TSH, T4TOTAL, FREET4, T3FREE, THYROIDAB in the last 72 hours. Anemia Panel: No results for input(s): VITAMINB12, FOLATE, FERRITIN, TIBC, IRON, RETICCTPCT in the last 72 hours. Sepsis Labs: No results for input(s): PROCALCITON, LATICACIDVEN in the last 168 hours.  No results found for this or any previous visit (from the past 240 hour(s)).       Radiology Studies: No results found.      Scheduled Meds: . baclofen  20 mg Oral TID  . bisacodyl  10 mg Rectal Daily  . enoxaparin (LOVENOX) injection  40 mg Subcutaneous Q24H  . ferrous sulfate  325 mg Oral BID WC  . gabapentin  800 mg Oral TID  . lactose free nutrition  237 mL Oral TID WC  . lamoTRIgine  125 mg Oral Daily  . lubiprostone  24 mcg Oral Q breakfast  . pantoprazole  20 mg Oral Daily  . potassium chloride  40 mEq Oral Daily  . senna-docusate  1 tablet Oral BID  . sertraline  100 mg Oral Daily   Continuous Infusions: . 0.9 % NaCl with KCl 20 mEq / L 10 mL/hr at 03/05/17 1800  . DAPTOmycin (CUBICIN)  IV 360 mg (03/06/17 1813)  . levofloxacin (LEVAQUIN) IV Stopped (03/06/17 1635)  . tigecycline (TYGACIL) IVPB Stopped (03/07/17 0210)     LOS: 19 days    Dron Tanna Furry, MD [facility] Pager [phone]  If 7PM-7AM, please contact night-coverage [email or web address] Password [email or web address] 03/07/2017, 2:15 PM

## 2017-03-08 LAB — TROPONIN I: Troponin I: <0.03 ng/mL (ref <0.03)

## 2017-03-08 LAB — BASIC METABOLIC PANEL
ANION GAP: 6 (ref 5–15)
BUN: 16 mg/dL (ref 6–20)
CHLORIDE: 107 mmol/L (ref 101–111)
CO2: 26 mmol/L (ref 22–32)
Calcium: 8.6 mg/dL — ABNORMAL LOW (ref 8.9–10.3)
Creatinine, Ser: 0.3 mg/dL — ABNORMAL LOW (ref 0.44–1.00)
Glucose, Bld: 88 mg/dL (ref 65–99)
POTASSIUM: 4 mmol/L (ref 3.5–5.1)
SODIUM: 139 mmol/L (ref 135–145)

## 2017-03-08 LAB — CBC
HCT: 26.7 % — ABNORMAL LOW (ref 36.0–46.0)
HEMOGLOBIN: 7.7 g/dL — AB (ref 12.0–15.0)
MCH: 20.3 pg — AB (ref 26.0–34.0)
MCHC: 28.8 g/dL — ABNORMAL LOW (ref 30.0–36.0)
MCV: 70.4 fL — ABNORMAL LOW (ref 78.0–100.0)
PLATELETS: 199 10*3/uL (ref 150–400)
RBC: 3.79 MIL/uL — AB (ref 3.87–5.11)
RDW: 19.4 % — ABNORMAL HIGH (ref 11.5–15.5)
WBC: 4.2 10*3/uL (ref 4.0–10.5)

## 2017-03-08 LAB — D-DIMER, QUANTITATIVE: D-Dimer, Quant: 0.27 ug/mL-FEU (ref 0.00–0.50)

## 2017-03-08 NOTE — Progress Notes (Signed)
[facility] for Infectious Disease    Date of Admission:  02/13/2017   Total days of antibiotics 19          ID: [name] is a 36 y.o. 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 Not having significant nausea nor vomiting with abtx infusion  Afebrile.  Had episode of chest pain last night thought to be MSK strain  Medications:  . baclofen  20 mg Oral TID  . bisacodyl  10 mg Rectal Daily  . enoxaparin (LOVENOX) injection  40 mg Subcutaneous Q24H  . gabapentin  800 mg Oral TID  . lactose free nutrition  237 mL Oral TID WC  . lamoTRIgine  125 mg Oral Daily  . levofloxacin  750 mg Oral Daily  . lubiprostone  24 mcg Oral Q breakfast  . pantoprazole  20 mg Oral Daily  . senna-docusate  1 tablet Oral BID  . sertraline  100 mg Oral Daily    Objective: Vital signs in last 24 hours: Temp:  [98.6 F (37 C)-99.1 F (37.3 C)] 98.8 F (37.1 C) (12/07 0543) Pulse Rate:  [85-94] 93 (12/07 0543) Resp:  [14-16] 14 (12/07 0543) BP: (99-104)/(51-60) 100/53 (12/07 0543) SpO2:  [98 %-99 %] 99 % (12/07 0543) Physical Exam  Constitutional:  A xo yb 4 sitting up, eating dinner appears chronically ill and well-nourished. No distress.  HENT: [place]/AT, PERRLA, no scleral icterus, pale conjunctiva, sligntly flushed cheeks Mouth/Throat: Oropharynx is clear and moist. No oropharyngeal exudate. No signs of thrush Cardiovascular: Nl si s,2 no g/m/r Pulmonary/Chest: Effort normal and breath sounds normal. No respiratory distress.  has no wheezes.  Abdominal: Soft. Bowel  sounds are normal. Mildly distended. Ostomy in RLQ Neurological: alert and oriented to person, place, and time. Lower extremity paraplegia Ext: thin tapered fingers, mild clubbing  Lab Results Recent Labs    03/06/17 0420 03/08/17 0519  WBC 4.6 4.2  HGB 7.5* 7.7*  HCT 25.4* 26.7*  NA 139 139  K 3.3* 4.0  CL 107 107  CO2 26 26  BUN 17 16  CREATININE <0.30* 0.30*    Microbiology: Acinetobacter tigecycline S. But otherwise resistant  Studies/Results: Dg Chest Port 1 View  Result Date: 03/07/2017 CLINICAL DATA:  37 y/o  F; 1 hour of chest pain. EXAM: PORTABLE CHEST 1 VIEW COMPARISON:  11/14/2016 chest radiograph. FINDINGS: Stable heart size and mediastinal contours are within normal limits. Left central venous catheter tip projects over the lower SVC. Both lungs are clear. The visualized skeletal structures are unremarkable. Right upper quadrant cholecystectomy clips. IMPRESSION: No active disease. Electronically Signed   By: Kristine Garbe M.D.   On: 03/07/2017 22:35     Assessment/Plan: Pelvic osteomyelitis including pan sensitive proteus, MRSA, and MDRO/CRE acinetobacter - appears S tigecycline and I to levofloxacin.   Plan to do 6 wk of tigecycline, levo if she can tolerate duration of abtx. Levofloxacin has been changed to oral and remains to have 1 iv abtx for now  Nausea with vomiting= premedicate her with anti emetics for her tigecycline  infusion . continue zofran to sublingual to see that she tolerates it. Continue with oral benadryl for premedication  Hx of drug rash = now resolved  Anticipate to dc on tuesday  [name], [facility] for Infectious Diseases Cell: [phone] Pager: [phone]  03/08/2017, 5:05 PM

## 2017-03-08 NOTE — Progress Notes (Addendum)
PROGRESS NOTE    [name]  VFI:[id number] DOB: 1980/07/29 DOA: 02/13/2017 PCP: Lennie Odor, PA-C   Brief Narrative: 36 year old female with history of transverse myelitis, paraplegic bedbound, chronic sacral decub with a ulcer who comes in with foul-smelling discharge consistent with osteomyelitis.  Patient underwent surgical debridement and cultures growing resistant bacteria.  On antibiotics by infectious disease.  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  Underwent surgical debridement on November 19.  Cultures growing Acinetobacter resistant to all bacteria, Proteus pansensitive and MRSA.  Patient is currently on Levaquin, daptomycin and tigecycline.   -Patient is tolerating Tygacil premedicated with Zofran and Benadryl.  Currently on oral Zofran.  Patient has PICC line.  Discussed with Dr. Baxter Flattery today.  She recommended to continue until next week to make sure patient tolerates the antibiotics well and possible discharge with long-term antibiotics. Pt has very limited abx options because MDR organism.  The type of antibiotics and duration of antibiotics defer to infectious disease.  Discussed with the patient and her mother at bedside.  # Hypokalemia: replete KCL.  Improved  # anemia due to iron deficiency and anemia of chronic disease: Decline oral iron.  No sign of bleeding.  Monitor CBC.  Hemoglobin is stable.  #History of multiple sclerosis and transverse myelitis, paraplegia: Continue supportive care.  Continue Neurontin, baclofen, Lamictal  #Possible allergic reaction to polymyxin.    #Depression: Continue Zoloft.  Mood is stable.  #Chest pain likely musculoskeletal.  EKG is sinus rhythm.  Troponin negative.  Chest x-ray unremarkable.  Continue current pain management and supportive care.  Plan discussed with the patient and her mother at bedside.  DVT prophylaxis: Lovenox subcutaneous Code  Status: Full code Family Communication: Patient's mother at bedside Disposition Plan: Currently admitted    Consultants:   Surgery  Infectious disease  Procedures: Surgical debridement of sacral decubiti Antimicrobials: IV daptomycin, Levaquin and Tigecycline.  Subjective: Seen and examined at bedside.  No new event.  Denied nausea vomiting chest pain shortness of breath. Objective: Vitals:   03/07/17 1353 03/07/17 2112 03/07/17 2329 03/08/17 0543  BP: (!) 104/58 (!) 99/51 104/60 (!) 100/53  Pulse: 90 85 94 93  Resp: 17 16 16 14   Temp: 98.5 F (36.9 C) 98.6 F (37 C) 99.1 F (37.3 C) 98.8 F (37.1 C)  TempSrc: Oral Axillary Oral Oral  SpO2: 98% 99% 98% 99%  Weight:      Height:        Intake/Output Summary (Last 24 hours) at 03/08/2017 1304 Last data filed at 03/08/2017 1000 Gross per 24 hour  Intake 749 ml  Output 1900 ml  Net -1151 ml   Filed Weights   02/14/17 0057 02/27/17 1900  Weight: 59.7 kg (131 lb 9.8 oz) 60.8 kg (134 lb)    Examination:  General exam: Lying in bed comfortable, not in distress Respiratory system: Clear bilateral, respiratory effort normal, no wheezing or crackle cardiovascular system: Regular rate rhythm, S1-S2 normal.  No murmur appreciated. Gastrointestinal system: Abdomen soft, nontender, nondistended.  Bowel sounds positive.   Central nervous system: Alert and oriented.  Extremities: Spastic lower extremities Psychiatry: Judgement and insight appear normal. Mood & affect appropriate.     Data Reviewed: I have personally reviewed following labs and imaging studies  CBC: Recent Labs  Lab 03/06/17 0420 03/08/17 0519  WBC 4.6 4.2  NEUTROABS 3.0  --   HGB 7.5* 7.7*  HCT 25.4* 26.7*  MCV 70.6*  70.4*  PLT 198 160   Basic Metabolic Panel: Recent Labs  Lab 03/03/17 0456 03/06/17 0420 03/08/17 0519  NA  --  139 139  K  --  3.3* 4.0  CL  --  107 107  CO2  --  26 26  GLUCOSE  --  94 88  BUN  --  17 16  CREATININE  <0.30* <0.30* 0.30*  CALCIUM  --  8.7* 8.6*  MG  --  1.8  --    GFR: Estimated Creatinine Clearance: 80.4 mL/min (A) (by C-G formula based on SCr of 0.3 mg/dL (L)). Liver Function Tests: Recent Labs  Lab 03/06/17 0420  AST 12*  ALT 10*  ALKPHOS 67  BILITOT 0.4  PROT 5.7*  ALBUMIN 3.4*   No results for input(s): LIPASE, AMYLASE in the last 168 hours. No results for input(s): AMMONIA in the last 168 hours. Coagulation Profile: No results for input(s): INR, PROTIME in the last 168 hours. Cardiac Enzymes: Recent Labs  Lab 03/04/17 0628 03/08/17 0059  CKTOTAL 17*  --   TROPONINI  --  <0.03   BNP (last 3 results) No results for input(s): PROBNP in the last 8760 hours. HbA1C: No results for input(s): HGBA1C in the last 72 hours. CBG: No results for input(s): GLUCAP in the last 168 hours. Lipid Profile: No results for input(s): CHOL, HDL, LDLCALC, TRIG, CHOLHDL, LDLDIRECT in the last 72 hours. Thyroid Function Tests: No results for input(s): TSH, T4TOTAL, FREET4, T3FREE, THYROIDAB in the last 72 hours. Anemia Panel: No results for input(s): VITAMINB12, FOLATE, FERRITIN, TIBC, IRON, RETICCTPCT in the last 72 hours. Sepsis Labs: No results for input(s): PROCALCITON, LATICACIDVEN in the last 168 hours.  No results found for this or any previous visit (from the past 240 hour(s)).       Radiology Studies: Dg Chest Port 1 View  Result Date: 03/07/2017 CLINICAL DATA:  36 y/o  F; 1 hour of chest pain. EXAM: PORTABLE CHEST 1 VIEW COMPARISON:  11/14/2016 chest radiograph. FINDINGS: Stable heart size and mediastinal contours are within normal limits. Left central venous catheter tip projects over the lower SVC. Both lungs are clear. The visualized skeletal structures are unremarkable. Right upper quadrant cholecystectomy clips. IMPRESSION: No active disease. Electronically Signed   By: Kristine Garbe M.D.   On: 03/07/2017 22:35        Scheduled Meds: . baclofen  20  mg Oral TID  . bisacodyl  10 mg Rectal Daily  . enoxaparin (LOVENOX) injection  40 mg Subcutaneous Q24H  . gabapentin  800 mg Oral TID  . lactose free nutrition  237 mL Oral TID WC  . lamoTRIgine  125 mg Oral Daily  . levofloxacin  750 mg Oral Daily  . lubiprostone  24 mcg Oral Q breakfast  . pantoprazole  20 mg Oral Daily  . senna-docusate  1 tablet Oral BID  . sertraline  100 mg Oral Daily   Continuous Infusions: . 0.9 % NaCl with KCl 20 mEq / L 10 mL/hr at 03/05/17 1800  . tigecycline (TYGACIL) IVPB Stopped (03/08/17 0228)     LOS: 20 days    Dron Tanna Furry, MD [facility] Pager [phone]  If 7PM-7AM, please contact night-coverage [email or web address] Password [email or web address] 03/08/2017, 1:04 PM

## 2017-03-09 NOTE — Progress Notes (Signed)
PROGRESS NOTE    [name]  IHK:[id number] DOB: [birth year] DOA: 02/13/2017 PCP: Lennie Odor, PA-C   Brief Narrative: 36 year old female with history of transverse myelitis, paraplegic bedbound, chronic sacral decub with a ulcer who comes in with foul-smelling discharge consistent with osteomyelitis.  Patient underwent surgical debridement and cultures growing resistant bacteria.  On antibiotics by infectious disease.  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  Underwent surgical debridement on November 19.  Cultures growing Acinetobacter resistant to all bacteria, Proteus pansensitive and MRSA.  Patient is currently on Levaquin, daptomycin and tigecycline.   -Patient is tolerating Tygacil premedicated with Zofran and Benadryl.  Currently on oral Zofran.  Patient has PICC line.  Discussed with Dr. Baxter Flattery on 12/7.  She recommended to continue inpatient antibiotics until next week to make sure patient tolerates the antibiotics well and possible discharge with long-term antibiotics. Pt has very limited abx options because MDR organism.  The type of antibiotics and duration of antibiotics defer to infectious disease.  Discussed with the patient and her mother at bedside.  # Hypokalemia: replete KCL.  Improved  # anemia due to iron deficiency and anemia of chronic disease: Decline oral iron.  No sign of bleeding.  Monitor CBC.  Hemoglobin is stable.  #History of multiple sclerosis and transverse myelitis, paraplegia: Continue supportive care.  Continue Neurontin, baclofen, Lamictal  #Possible allergic reaction to polymyxin.    #Depression: Continue Zoloft.  Mood is stable.  #Chest pain likely musculoskeletal.  EKG is sinus rhythm.  Troponin negative.  Chest x-ray unremarkable.  Denied chest pain today..  No new event.  Continue antibiotics and supportive care.  DVT prophylaxis: Lovenox subcutaneous Code Status:  Full code Family Communication: Patient's mother at bedside Disposition Plan: Currently admitted    Consultants:   Surgery  Infectious disease  Procedures: Surgical debridement of sacral decubiti Antimicrobials: IV daptomycin, Levaquin and Tigecycline.  Subjective: Seen and examined at bedside.  No new event.  Denies headache, dizziness, nausea vomiting chest pain shortness of breath. Objective: Vitals:   03/08/17 0543 03/08/17 1400 03/08/17 2210 03/09/17 0500  BP: (!) 100/53 (!) 104/59 (!) 99/52 (!) 110/59  Pulse: 93 88 89 90  Resp: 14 17 18 18   Temp: 98.8 F (37.1 C) 98.8 F (37.1 C) 99.1 F (37.3 C) 98.4 F (36.9 C)  TempSrc: Oral Oral Oral Oral  SpO2: 99% 100% 100% 100%  Weight:      Height:        Intake/Output Summary (Last 24 hours) at 03/09/2017 1040 Last data filed at 03/09/2017 0550 Gross per 24 hour  Intake 829.67 ml  Output 1250 ml  Net -420.33 ml   Filed Weights   02/14/17 0057 02/27/17 1900  Weight: 59.7 kg (131 lb 9.8 oz) 60.8 kg (134 lb)    Examination:  General exam: Lying in bed comfortable, not in distress Respiratory system: Clear bilateral, respiratory for normal cardiovascular system: Regular rate rhythm S1-S2 normal.  No murmur appreciated. Gastrointestinal system: Abdomen soft, nontender, nondistended.  Bowel sounds positive.   Central nervous system: Alert and oriented.  Extremities: Spastic lower extremities Psychiatry: Judgement and insight appear normal. Mood & affect appropriate.     Data Reviewed: I have personally reviewed following labs and imaging studies  CBC: Recent Labs  Lab 03/06/17 0420 03/08/17 0519  WBC 4.6 4.2  NEUTROABS 3.0  --   HGB 7.5* 7.7*  HCT 25.4* 26.7*  MCV 70.6* 70.4*  PLT 198 094   Basic Metabolic Panel: Recent Labs  Lab 03/03/17 0456 03/06/17 0420 03/08/17 0519  NA  --  139 139  K  --  3.3* 4.0  CL  --  107 107  CO2  --  26 26  GLUCOSE  --  94 88  BUN  --  17 16  CREATININE <0.30*  <0.30* 0.30*  CALCIUM  --  8.7* 8.6*  MG  --  1.8  --    GFR: Estimated Creatinine Clearance: 80.4 mL/min (A) (by C-G formula based on SCr of 0.3 mg/dL (L)). Liver Function Tests: Recent Labs  Lab 03/06/17 0420  AST 12*  ALT 10*  ALKPHOS 67  BILITOT 0.4  PROT 5.7*  ALBUMIN 3.4*   No results for input(s): LIPASE, AMYLASE in the last 168 hours. No results for input(s): AMMONIA in the last 168 hours. Coagulation Profile: No results for input(s): INR, PROTIME in the last 168 hours. Cardiac Enzymes: Recent Labs  Lab 03/04/17 0628 03/08/17 0059  CKTOTAL 17*  --   TROPONINI  --  <0.03   BNP (last 3 results) No results for input(s): PROBNP in the last 8760 hours. HbA1C: No results for input(s): HGBA1C in the last 72 hours. CBG: No results for input(s): GLUCAP in the last 168 hours. Lipid Profile: No results for input(s): CHOL, HDL, LDLCALC, TRIG, CHOLHDL, LDLDIRECT in the last 72 hours. Thyroid Function Tests: No results for input(s): TSH, T4TOTAL, FREET4, T3FREE, THYROIDAB in the last 72 hours. Anemia Panel: No results for input(s): VITAMINB12, FOLATE, FERRITIN, TIBC, IRON, RETICCTPCT in the last 72 hours. Sepsis Labs: No results for input(s): PROCALCITON, LATICACIDVEN in the last 168 hours.  No results found for this or any previous visit (from the past 240 hour(s)).       Radiology Studies: Dg Chest Port 1 View  Result Date: 03/07/2017 CLINICAL DATA:  36 y/o  F; 1 hour of chest pain. EXAM: PORTABLE CHEST 1 VIEW COMPARISON:  11/14/2016 chest radiograph. FINDINGS: Stable heart size and mediastinal contours are within normal limits. Left central venous catheter tip projects over the lower SVC. Both lungs are clear. The visualized skeletal structures are unremarkable. Right upper quadrant cholecystectomy clips. IMPRESSION: No active disease. Electronically Signed   By: Kristine Garbe M.D.   On: 03/07/2017 22:35        Scheduled Meds: . baclofen  20 mg  Oral TID  . bisacodyl  10 mg Rectal Daily  . enoxaparin (LOVENOX) injection  40 mg Subcutaneous Q24H  . gabapentin  800 mg Oral TID  . lactose free nutrition  237 mL Oral TID WC  . lamoTRIgine  125 mg Oral Daily  . levofloxacin  750 mg Oral Daily  . lubiprostone  24 mcg Oral Q breakfast  . pantoprazole  20 mg Oral Daily  . senna-docusate  1 tablet Oral BID  . sertraline  100 mg Oral Daily   Continuous Infusions: . 0.9 % NaCl with KCl 20 mEq / L 10 mL/hr at 03/05/17 1800  . tigecycline (TYGACIL) IVPB 50 mg (03/09/17 0208)     LOS: 21 days    [place] Tanna Furry, MD [facility] Pager [phone]  If 7PM-7AM, please contact night-coverage [email or web address] Password [email or web address] 03/09/2017, 10:40 AM

## 2017-03-09 NOTE — Progress Notes (Signed)
Pt tolerated IV antibiotic this afternoon following ordered Zofran and Benadryl. O2 sats remained at 100% during infusion. No distress nor signs of reaction noted.

## 2017-03-10 LAB — AEROBIC/ANAEROBIC CULTURE (SURGICAL/DEEP WOUND)

## 2017-03-10 LAB — AEROBIC/ANAEROBIC CULTURE W GRAM STAIN (SURGICAL/DEEP WOUND)

## 2017-03-10 MED ORDER — ONDANSETRON HCL 4 MG/2ML IJ SOLN
4.0000 mg | Freq: Four times a day (QID) | INTRAMUSCULAR | Status: DC | PRN
  Administered 2017-03-11: 4 mg via INTRAVENOUS

## 2017-03-10 NOTE — Progress Notes (Signed)
PROGRESS NOTE    [name]  CBJ:[id number] DOB: [birth year] DOA: 02/13/2017 PCP: Lennie Odor, PA-C   Brief Narrative: 36 year old female with history of transverse myelitis, paraplegic bedbound, chronic sacral decub with a ulcer who comes in with foul-smelling discharge consistent with osteomyelitis.  Patient underwent surgical debridement and cultures growing resistant bacteria.  On antibiotics by infectious disease.  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  Underwent surgical debridement on November 19.  Cultures growing Acinetobacter resistant to all bacteria, Proteus pansensitive and MRSA.  Patient is currently on Levaquin, daptomycin and tigecycline.   -Patient is tolerating Tygacil premedicated with Zofran and Benadryl.  Currently on oral Zofran.  Patient has PICC line.  Discussed with Dr. Baxter Flattery on 12/7.  She recommended to continue inpatient antibiotics until next week to make sure patient tolerates the antibiotics well and possible discharge with long-term antibiotics. Pt has very limited abx options because MDR organism.  The type of antibiotics and duration of antibiotics defer to infectious disease.  Patient has been tolerating antibiotics well.  I have discussed with the patient and her mother at bedside.  # Hypokalemia: replete KCL.  Improved  # anemia due to iron deficiency and anemia of chronic disease: Decline oral iron.  No sign of bleeding.  Monitor CBC.  Hemoglobin is stable.  #History of multiple sclerosis and transverse myelitis, paraplegia: Continue supportive care.  Continue Neurontin, baclofen, Lamictal  #Possible allergic reaction to polymyxin.    #Depression: Continue Zoloft.  Mood is stable.  #Chest pain likely musculoskeletal.  EKG is sinus rhythm.  Troponin negative.  Chest x-ray unremarkable.  No chest pain today.  No new event.  Tolerating antibiotics well.  Continue current  medical and supportive care.  ID follow-up.  DVT prophylaxis: Lovenox subcutaneous Code Status: Full code Family Communication: Patient's mother at bedside Disposition Plan: Currently admitted    Consultants:   Surgery  Infectious disease  Procedures: Surgical debridement of sacral decubiti Antimicrobials:  Levaquin and Tigecycline.  Subjective: Seen and examined at bedside.  No new event.  Denies headache, dizziness, nausea vomiting.  Patient's mother was concerned because of not getting breakfast on time today.    Objective: Vitals:   03/09/17 0500 03/09/17 1434 03/09/17 2114 03/10/17 0638  BP: (!) 110/59 97/61 104/66 103/61  Pulse: 90 92 99 87  Resp: 18 16 16 18   Temp: 98.4 F (36.9 C) 99.1 F (37.3 C) 99.1 F (37.3 C) 98.5 F (36.9 C)  TempSrc: Oral Oral Oral Oral  SpO2: 100% 100% 100% 100%  Weight:      Height:        Intake/Output Summary (Last 24 hours) at 03/10/2017 1219 Last data filed at 03/10/2017 1017 Gross per 24 hour  Intake 1000 ml  Output 1575 ml  Net -575 ml   Filed Weights   02/14/17 0057 02/27/17 1900  Weight: 59.7 kg (131 lb 9.8 oz) 60.8 kg (134 lb)    Examination:  General exam: Lying in bed comfortable, not in distress Respiratory system: Clear bilateral, no wheezing or crackle l cardiovascular system: Regular rate rhythm S1-S2 normal.  No murmur appreciated. Gastrointestinal system: Abdomen soft, nontender, nondistended.  Bowel sounds positive.   Central nervous system: Alert and oriented.  Extremities: Spastic lower extremities Psychiatry: Judgement and insight appear normal. Mood & affect appropriate.     Data Reviewed: I have personally reviewed following labs and imaging studies  CBC: Recent Labs  Lab 03/06/17  0420 03/08/17 0519  WBC 4.6 4.2  NEUTROABS 3.0  --   HGB 7.5* 7.7*  HCT 25.4* 26.7*  MCV 70.6* 70.4*  PLT 198 694   Basic Metabolic Panel: Recent Labs  Lab 03/06/17 0420 03/08/17 0519  NA 139 139  K 3.3*  4.0  CL 107 107  CO2 26 26  GLUCOSE 94 88  BUN 17 16  CREATININE <0.30* 0.30*  CALCIUM 8.7* 8.6*  MG 1.8  --    GFR: Estimated Creatinine Clearance: 80.4 mL/min (A) (by C-G formula based on SCr of 0.3 mg/dL (L)). Liver Function Tests: Recent Labs  Lab 03/06/17 0420  AST 12*  ALT 10*  ALKPHOS 67  BILITOT 0.4  PROT 5.7*  ALBUMIN 3.4*   No results for input(s): LIPASE, AMYLASE in the last 168 hours. No results for input(s): AMMONIA in the last 168 hours. Coagulation Profile: No results for input(s): INR, PROTIME in the last 168 hours. Cardiac Enzymes: Recent Labs  Lab 03/04/17 0628 03/08/17 0059  CKTOTAL 17*  --   TROPONINI  --  <0.03   BNP (last 3 results) No results for input(s): PROBNP in the last 8760 hours. HbA1C: No results for input(s): HGBA1C in the last 72 hours. CBG: No results for input(s): GLUCAP in the last 168 hours. Lipid Profile: No results for input(s): CHOL, HDL, LDLCALC, TRIG, CHOLHDL, LDLDIRECT in the last 72 hours. Thyroid Function Tests: No results for input(s): TSH, T4TOTAL, FREET4, T3FREE, THYROIDAB in the last 72 hours. Anemia Panel: No results for input(s): VITAMINB12, FOLATE, FERRITIN, TIBC, IRON, RETICCTPCT in the last 72 hours. Sepsis Labs: No results for input(s): PROCALCITON, LATICACIDVEN in the last 168 hours.  No results found for this or any previous visit (from the past 240 hour(s)).       Radiology Studies: No results found.      Scheduled Meds: . baclofen  20 mg Oral TID  . bisacodyl  10 mg Rectal Daily  . enoxaparin (LOVENOX) injection  40 mg Subcutaneous Q24H  . gabapentin  800 mg Oral TID  . lactose free nutrition  237 mL Oral TID WC  . lamoTRIgine  125 mg Oral Daily  . levofloxacin  750 mg Oral Daily  . lubiprostone  24 mcg Oral Q breakfast  . pantoprazole  20 mg Oral Daily  . senna-docusate  1 tablet Oral BID  . sertraline  100 mg Oral Daily   Continuous Infusions: . 0.9 % NaCl with KCl 20 mEq / L 10  mL/hr at 03/05/17 1800  . tigecycline (TYGACIL) IVPB Stopped (03/10/17 0252)     LOS: 22 days    Dron Tanna Furry, MD [facility] Pager [phone]  If 7PM-7AM, please contact night-coverage [email or web address] Password [email or web address] 03/10/2017, 12:19 PM

## 2017-03-10 NOTE — Progress Notes (Signed)
Pt vomited 50 ml tan fluid with undigested stomach contents. Pt expressed relief w/no further c/o nausea. Reassurance given.

## 2017-03-10 NOTE — Progress Notes (Signed)
Dr. Carolin Sicks aware of NV episode see new order received. Pt doin well with no further complaints at this time.

## 2017-03-10 NOTE — Progress Notes (Signed)
Tygacil nearly completed. Pt received premedications of Benadryl and Zofran. O2 sats at 100% since infusion began. No complaints voiced. No distress nor s/o reaction.

## 2017-03-11 DIAGNOSIS — M62562 Muscle wasting and atrophy, not elsewhere classified, left lower leg: Secondary | ICD-10-CM

## 2017-03-11 DIAGNOSIS — M62561 Muscle wasting and atrophy, not elsewhere classified, right lower leg: Secondary | ICD-10-CM

## 2017-03-11 DIAGNOSIS — M62838 Other muscle spasm: Secondary | ICD-10-CM

## 2017-03-11 DIAGNOSIS — L89159 Pressure ulcer of sacral region, unspecified stage: Secondary | ICD-10-CM

## 2017-03-11 LAB — CK: CK TOTAL: 10 U/L — AB (ref 38–234)

## 2017-03-11 MED ORDER — PROCHLORPERAZINE MALEATE 10 MG PO TABS
10.0000 mg | ORAL_TABLET | Freq: Once | ORAL | Status: AC
  Administered 2017-03-11: 10 mg via ORAL
  Filled 2017-03-11 (×2): qty 1

## 2017-03-11 NOTE — Progress Notes (Signed)
PROGRESS NOTE    [name]  AGT:[id number] DOB: 1981/02/08 DOA: 02/13/2017 PCP: Veronica Odor, PA-C   Brief Narrative: 36 year old female with history of transverse myelitis, paraplegic bedbound, chronic sacral decub with a ulcer who comes in with foul-smelling discharge consistent with osteomyelitis.  Patient underwent surgical debridement and cultures growing resistant bacteria.  On antibiotics by infectious disease.  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  Underwent surgical debridement on November 19.  Cultures growing Acinetobacter resistant to all bacteria, Proteus pansensitive and MRSA.  Patient is currently on Levaquin, daptomycin and tigecycline.   -Patient is tolerating Tygacil premedicated with Zofran and Benadryl.  Currently on oral Zofran.  Patient has PICC line.  Discussed with Dr. Baxter Flattery on 12/7.  She recommended to continue inpatient antibiotics until next week to make sure patient tolerates the antibiotics well and possible discharge with long-term antibiotics. Pt has very limited abx options because MDR organism.  The type of antibiotics and duration of antibiotics defer to infectious disease.  Patient has been tolerating antibiotics well.  I have discussed with the patient and her mother at bedside. -Follow-up ID further plan.  #Nausea vomiting: Improving with Zofran.  Continue supportive care.  Abdominal exam benign.  # Hypokalemia: replete KCL.  Improved  # anemia due to iron deficiency and anemia of chronic disease: Decline oral iron.  No sign of bleeding.  Monitor CBC.  Hemoglobin is stable.  #History of multiple sclerosis and transverse myelitis, paraplegia: Continue supportive care.  Continue Neurontin, baclofen, Lamictal  #Possible allergic reaction to polymyxin.    #Depression: Continue Zoloft.  Mood is stable.  #Chest pain likely musculoskeletal.  EKG is sinus rhythm.  Troponin  negative.  Chest x-ray unremarkable.  No chest pain today.  No new event.  Tolerating antibiotics well.  Continue current medical and supportive care.  ID follow-up.  DVT prophylaxis: Lovenox subcutaneous Code Status: Full code Family Communication: Patient's mother at bedside Disposition Plan: Currently admitted    Consultants:   Surgery  Infectious disease  Procedures: Surgical debridement of sacral decubiti Antimicrobials:  Levaquin and Tigecycline.  Subjective: Seen and examined at bedside.  Reported episode of nausea and vomiting yesterday.  Denied headache, dizziness, nausea or vomiting currently.  Denies chest pain or shortness of breath.  Patient's mother at bedside. Objective: Vitals:   03/10/17 1440 03/10/17 2256 03/11/17 0240 03/11/17 0641  BP: (!) 106/59 (!) 103/58  (!) 95/54  Pulse: 84 89 91 81  Resp: 18 18  18   Temp: 98.3 F (36.8 C) 98.6 F (37 C)  98.9 F (37.2 C)  TempSrc: Oral Axillary  Oral  SpO2: 99% 100% 100% 99%  Weight:      Height:        Intake/Output Summary (Last 24 hours) at 03/11/2017 1254 Last data filed at 03/11/2017 1000 Gross per 24 hour  Intake 1160 ml  Output 1300 ml  Net -140 ml   Filed Weights   02/14/17 0057 02/27/17 1900  Weight: 59.7 kg (131 lb 9.8 oz) 60.8 kg (134 lb)    Examination:  General exam: Lying in bed comfortable Respiratory system: Clear bilateral, no wheezing or crackle. cardiovascular system: Regular rate rhythm, S1-S2 normal.  No murmur appreciated.. Gastrointestinal system: Abdomen soft, nontender, nondistended.  Bowel sounds positive. Central nervous system: Alert and oriented.  Extremities: Spastic lower extremities Psychiatry: Judgement and insight appear normal. Mood & affect appropriate.     Data Reviewed: I have  personally reviewed following labs and imaging studies  CBC: Recent Labs  Lab 03/06/17 0420 03/08/17 0519  WBC 4.6 4.2  NEUTROABS 3.0  --   HGB 7.5* 7.7*  HCT 25.4* 26.7*    MCV 70.6* 70.4*  PLT 198 993   Basic Metabolic Panel: Recent Labs  Lab 03/06/17 0420 03/08/17 0519  NA 139 139  K 3.3* 4.0  CL 107 107  CO2 26 26  GLUCOSE 94 88  BUN 17 16  CREATININE <0.30* 0.30*  CALCIUM 8.7* 8.6*  MG 1.8  --    GFR: Estimated Creatinine Clearance: 80.4 mL/min (A) (by C-G formula based on SCr of 0.3 mg/dL (L)). Liver Function Tests: Recent Labs  Lab 03/06/17 0420  AST 12*  ALT 10*  ALKPHOS 67  BILITOT 0.4  PROT 5.7*  ALBUMIN 3.4*   No results for input(s): LIPASE, AMYLASE in the last 168 hours. No results for input(s): AMMONIA in the last 168 hours. Coagulation Profile: No results for input(s): INR, PROTIME in the last 168 hours. Cardiac Enzymes: Recent Labs  Lab 03/08/17 0059 03/11/17 0421  CKTOTAL  --  10*  TROPONINI <0.03  --    BNP (last 3 results) No results for input(s): PROBNP in the last 8760 hours. HbA1C: No results for input(s): HGBA1C in the last 72 hours. CBG: No results for input(s): GLUCAP in the last 168 hours. Lipid Profile: No results for input(s): CHOL, HDL, LDLCALC, TRIG, CHOLHDL, LDLDIRECT in the last 72 hours. Thyroid Function Tests: No results for input(s): TSH, T4TOTAL, FREET4, T3FREE, THYROIDAB in the last 72 hours. Anemia Panel: No results for input(s): VITAMINB12, FOLATE, FERRITIN, TIBC, IRON, RETICCTPCT in the last 72 hours. Sepsis Labs: No results for input(s): PROCALCITON, LATICACIDVEN in the last 168 hours.  No results found for this or any previous visit (from the past 240 hour(s)).       Radiology Studies: No results found.      Scheduled Meds: . baclofen  20 mg Oral TID  . bisacodyl  10 mg Rectal Daily  . enoxaparin (LOVENOX) injection  40 mg Subcutaneous Q24H  . gabapentin  800 mg Oral TID  . lactose free nutrition  237 mL Oral TID WC  . lamoTRIgine  125 mg Oral Daily  . levofloxacin  750 mg Oral Daily  . lubiprostone  24 mcg Oral Q breakfast  . pantoprazole  20 mg Oral Daily  .  prochlorperazine  10 mg Oral Once  . senna-docusate  1 tablet Oral BID  . sertraline  100 mg Oral Daily   Continuous Infusions: . 0.9 % NaCl with KCl 20 mEq / L 10 mL/hr at 03/10/17 1758  . tigecycline (TYGACIL) IVPB Stopped (03/11/17 0306)     LOS: 23 days    Veronica Sula Tanna Furry, MD [facility] Pager [phone]  If 7PM-7AM, please contact night-coverage [email or web address] Password [email or web address] 03/11/2017, 12:54 PM

## 2017-03-11 NOTE — Progress Notes (Signed)
    [facility] for Infectious Disease    Date of Admission:  02/13/2017   Total days of antibiotics 22   ID: [name] is a 35 y.o. female with  Sacral pressure ulcer wounds/osteo Principal Problem:   Decubitus ulcer of left ischial area, stage III (HCC) Active Problems:   Paraplegia (HCC)   History of transverse myelitis   N&V (nausea and vomiting)   Decubitus ulcer   Osteomyelitis, pelvis (HCC)   Wound disruption   MRSA infection   Acinetobacter lwoffi infection   Proteus infection   Allergic reaction   MDR Acinetobacter baumannii infection    Subjective: Mild nausea  Medications:  . baclofen  20 mg Oral TID  . bisacodyl  10 mg Rectal Daily  . enoxaparin (LOVENOX) injection  40 mg Subcutaneous Q24H  . gabapentin  800 mg Oral TID  . lactose free nutrition  237 mL Oral TID WC  . lamoTRIgine  125 mg Oral Daily  . levofloxacin  750 mg Oral Daily  . lubiprostone  24 mcg Oral Q breakfast  . pantoprazole  20 mg Oral Daily  . senna-docusate  1 tablet Oral BID  . sertraline  100 mg Oral Daily    Objective: Vital signs in last 24 hours: Temp:  [98.6 F (37 C)-99.5 F (37.5 C)] 98.7 F (37.1 C) (12/10 1607) Pulse Rate:  [81-91] 87 (12/10 1607) Resp:  [16-18] 16 (12/10 1607) BP: (95-114)/(54-64) 114/64 (12/10 1607) SpO2:  [99 %-100 %] 100 % (12/10 1607) Physical Exam  Constitutional:  oriented to person, place, and time. appears chronically ill and undernourished. No distress.  HENT: [place]/AT, PERRLA, no scleral icterus. peridontal disease Mouth/Throat: Oropharynx is clear and moist. No oropharyngeal exudate.  Cardiovascular: Normal rate, regular rhythm and normal heart sounds. Exam reveals no gallop and no friction rub.  No murmur heard.  Pulmonary/Chest: Effort normal and breath sounds normal. No respiratory distress.  has no wheezes.   Abdominal: Soft. Bowel sounds are normal.  exhibits no distension. There is no tenderness.  Neurological: alert and  oriented to person, place, and time. Lower extremity muscel wasting, muscle spasm when I readjusted her SCD. 0/5 motor Skin: Skin is warm and dry. No rash noted. No erythema.  Psychiatric: a normal mood and affect.  behavior is normal.   Lab Results Lab Results  Component Value Date   ESRSEDRATE 7 02/15/2017   Lab Results  Component Value Date   CRP 6.8 (H) 02/15/2017    Microbiology: reviewed  Assessment/Plan:Pelvic osteomyelitis including pan sensitive proteus, MRSA, and MDRO/CRE acinetobacter - appears S tigecycline and I to levofloxacin.   - she appears to be tolerating tigecycline as long as premedicated with SL zofran plus oral benadryl. Recommend to continue and plan to give for addn 4 wk - continue as well as with levofloxacin oral, current dose x 4 wk to complete her treatment course.    Baxter Flattery [facility] for Infectious Diseases Cell: [phone] Pager: [phone]  03/11/2017, 10:48 PM

## 2017-03-12 MED ORDER — ONDANSETRON 4 MG PO TBDP: 4.0000 mg | ORAL_TABLET | Freq: Three times a day (TID) | ORAL | 0 refills | Status: DC | PRN

## 2017-03-12 MED ORDER — TIGECYCLINE 50 MG IV SOLR: 50.0000 mg | Freq: Two times a day (BID) | INTRAVENOUS | 0 refills | Status: DC

## 2017-03-12 MED ORDER — HEPARIN SOD (PORK) LOCK FLUSH 100 UNIT/ML IV SOLN
250.0000 [IU] | INTRAVENOUS | Status: DC | PRN
  Administered 2017-03-12: 500 [IU]

## 2017-03-12 MED ORDER — LEVOFLOXACIN 750 MG PO TABS: 750.0000 mg | ORAL_TABLET | Freq: Every day | ORAL | 0 refills | Status: DC

## 2017-03-12 MED ORDER — DIPHENHYDRAMINE HCL 25 MG PO CAPS: 25.0000 mg | ORAL_CAPSULE | Freq: Three times a day (TID) | ORAL | 0 refills | Status: DC | PRN

## 2017-03-12 NOTE — Progress Notes (Signed)
Nurse spoke with [name], infusion nurse with [facility]. Patient will get antibiotics tomorrow morning at 8am. Patient is due antibiotic dose tonight at 9pm. Per Dr. Carolin Sicks, patient cannot miss 9pm dose tonight. If patients mother is not comfortable giving antibiotic tonight after Pam teaches her how to administer antibiotic, patient will stay tonight to get IV antibiotic and will leave tomorrow. Pam with [facility] is currently in room and will confirm mother's ability to administer IV antibiotic.  Per Pam, patients mother feels comfortable with administering antibiotic.

## 2017-03-12 NOTE — Progress Notes (Signed)
IV nurse is currently in room disconnecting PICC line for patient to return home with PICC line to receive antibiotics. [facility] is here to transport patient from hospital to home. Nurse reviewed discharge instructions with patient and mother, both patient and mother demonstrate understanding of discharge instructions and importance of patient receiving antibiotic dose at 9pm tonight.

## 2017-03-12 NOTE — Progress Notes (Signed)
PHARMACY CONSULT NOTE FOR:  OUTPATIENT  PARENTERAL ANTIBIOTIC THERAPY (OPAT)  Indication: pelvic osteomyelitis Regimen: Tigecycline 50mg  IV q12h + Levaquin 750mg  PO q24h End date: 04/18/17  IV antibiotic discharge orders are pended. To discharging provider:  please sign these orders via discharge navigator,  Select New Orders & click on the button choice - Manage This Unsigned Work.     Thank you for allowing pharmacy to be a part of this patient's care.  Peggyann Juba, PharmD, BCPS Pager: [phone] 03/12/2017, 9:35 AM

## 2017-03-12 NOTE — Progress Notes (Signed)
Patients mother explained to nurse, patients home medication schedule is 9am and 9pm. Patient is getting Tygacil at 2am and 2pm while in hospital. Per Dr. Carolin Sicks, pre medicate patient with Benadryl and zofran  Now, and give Tygacil in 30 minutes.

## 2017-03-12 NOTE — Plan of Care (Signed)
Plan of care reviewed with patient and her mother.

## 2017-03-12 NOTE — Progress Notes (Signed)
Plan for d/c home today, [facility] following for antibiotics and wound care. Mother requesting [facility] transport. Nurse aware to call when abx finished infusing. [phone]

## 2017-03-12 NOTE — Discharge Summary (Signed)
Physician Discharge Summary  [name]:[id number] DOB: 1980-04-27 DOA: 02/13/2017  PCP: Lennie Odor, PA-C  Admit date: 02/13/2017 Discharge date: 03/12/2017  Admitted From:home Disposition:home with home care and PICC line  Recommendations for Outpatient Follow-up:  1. Follow up with PCP in 1-2 weeks 2. Please obtain BMP/CBC in one week  Home Health:yes Equipment/Devices:picc line Discharge Condition:stable CODE STATUS:full code Diet recommendation:heart healthy  Brief/Interim Summary:36 year old female with history of transverse myelitis, paraplegic bedbound, chronic sacral decub with a ulcer who comes in with foul-smelling discharge consistent with osteomyelitis.  Patient underwent surgical debridement and cultures growing resistant bacteria.  On antibiotics by infectious disease.  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  Underwent surgical debridement on November 19.  Cultures growing Acinetobacter resistant to all bacteria, Proteus pansensitive and MRSA.  Patient was treated with multiple antibiotics in the hospital as per infectious disease.  Plan to discharge with 4 weeks of tigecycline and oral Levaquin.  Patient has PICC line in left upper extremity for the antibiotics.  Discharging with home care services. -Tolerating antibiotics well.  Wound care: To left ischial tuberosity, gluteal area, cleanse with NS, pat dry, apply NS moistened gauze, cover with dry gauze, ABD and mesh panties, perform twice daily  #Nausea vomiting: Improved with Zofran.  Continue medication and supportive care.  # Hypokalemia: replete KCL.  Improved  # anemia due to iron deficiency and anemia of chronic disease: Decline oral iron.  No sign of bleeding.  Monitor CBC.  Hemoglobin is stable.  #History of multiple sclerosis and transverse myelitis, paraplegia: Continue supportive care.  Continue Neurontin, baclofen,  Lamictal  #Possible allergic reaction to polymyxin.    #Depression: Continue Zoloft.  Mood is stable.  #Chest pain likely musculoskeletal.  EKG is sinus rhythm.  Troponin negative.  Chest x-ray unremarkable.    Clinically improved.  Patient with prolonged hospitalization because of multidrug-resistant infection requiring IV antibiotics.  Tolerating antibiotics well.  Plan to discharge home with home care services, PICC line and IV antibiotics.  Discussed with the patient and her mother at bedside.  Verbalized understanding.  Patient is medically stable to discharge home with outpatient follow-up.    Discharge Diagnoses:  Principal Problem:   Decubitus ulcer of left ischial area, stage III (HCC) Active Problems:   Paraplegia (HCC)   History of transverse myelitis   N&V (nausea and vomiting)   Decubitus ulcer   Osteomyelitis, pelvis (HCC)   Wound disruption   MRSA infection   Acinetobacter lwoffi infection   Proteus infection   Allergic reaction   MDR Acinetobacter baumannii infection    Discharge Instructions  Discharge Instructions    Diet - low sodium heart healthy   Complete by:  As directed    Face-to-face encounter (required for Medicare/Medicaid patients)   Complete by:  As directed    I Florencia Reasons certify that this patient is under my care and that I, or a nurse practitioner or physician's assistant working with me, had a face-to-face encounter that meets the physician face-to-face encounter requirements with this patient on 02/15/2017. The encounter with the patient was in whole, or in part for the following medical condition(s) which is the primary reason for home health care (List medical condition): wound care   The encounter with the patient was in whole, or in part, for the following medical condition, which is the primary reason for home health care:  FTT   I certify that, based on my findings, the following  services are medically necessary home health services:   Nursing   Reason for Medically Necessary Home Health Services:  Skilled Nursing- Change/Decline in Patient Status   My clinical findings support the need for the above services:  Unable to leave home safely without assistance and/or assistive device   Further, I certify that my clinical findings support that this patient is homebound due to:  Open/draining pressure/stasis ulcer   Home Health   Complete by:  As directed    To provide the following care/treatments:   [place] work PT OT     Home infusion instructions [place] May follow [place] Dosing Protocol; May administer Cathflo as needed to maintain patency of vascular access device.; Flushing of vascular access device: per [facility] Protocol: 0.9% NaCl pre/post medica...   Complete by:  As directed    Instructions:  May follow [place] Dosing Protocol   Instructions:  May administer Cathflo as needed to maintain patency of vascular access device.   Instructions:  Flushing of vascular access device: per [facility] Protocol: 0.9% NaCl pre/post medication administration and prn patency; Heparin 100 u/ml, 14m for implanted ports and Heparin 10u/ml, 5100mfor all other central venous catheters.   Instructions:  May follow [facility] Anaphylaxis Protocol for First Dose Administration in the home: 0.9% NaCl at 25-50 ml/hr to maintain IV access for protocol meds. Epinephrine 0.3 ml IV/IM PRN and Benadryl 25-50 IV/IM PRN s/s of anaphylaxis.   Instructions:  [facility] Coordinator (RN) to assist per patient IV care needs in the home PRN.   Increase activity slowly   Complete by:  As directed      Allergies as of 03/12/2017      Reactions   Iodine Anaphylaxis   Latex Anaphylaxis   Other Anaphylaxis   Polymyxin B Hives   Pt had hives in right arm with infusion of polymyxin on November 26th, 2018   Shellfish-derived Products Anaphylaxis   Reaction unknown   Vancomycin Anaphylaxis   Aspirin Other (See Comments)    Reaction unknown   Strawberry Extract Hives, Rash      Medication List    STOP taking these medications   IMODIUM A-D PO   misoprostol 200 MCG tablet Commonly known as:  CYTOTEC   polyethylene glycol packet Commonly known as:  MIRALAX     TAKE these medications   acetaminophen 325 MG tablet Commonly known as:  TYLENOL Take 650 mg by mouth every 6 (six) hours as needed for fever.   baclofen 20 MG tablet Commonly known as:  LIORESAL Take 20 mg by mouth 3 (three) times daily.   bisacodyl 10 MG suppository Commonly known as:  DULCOLAX Place 1 suppository (10 mg total) daily rectally.   diazepam 5 MG tablet Commonly known as:  VALIUM Take 1-2 tablets (5-10 mg total) by mouth every 8 (eight) hours. Take 1 tablet in AM, 1 tablet in PM, and 2 tablets at bedtime What changed:    when to take this  additional instructions   diphenhydrAMINE 25 mg capsule Commonly known as:  BENADRYL Take 1 capsule (25 mg total) by mouth every 8 (eight) hours as needed for itching.   fluticasone 50 MCG/ACT nasal spray Commonly known as:  FLONASE Place 2 sprays into the nose daily as needed for allergies.   gabapentin 400 MG capsule Commonly known as:  NEURONTIN Take 2 capsules (800 mg total) by mouth 3 (three) times daily.   ibuprofen 200 MG tablet  Commonly known as:  ADVIL,MOTRIN Take 200 mg every 6 (six) hours as needed by mouth for headache or moderate pain.   lamoTRIgine 25 MG tablet Commonly known as:  LAMICTAL Take 125 mg by mouth daily.   levofloxacin 750 MG tablet Commonly known as:  LEVAQUIN Take 1 tablet (750 mg total) by mouth daily for 28 days.   loratadine 5 MG/5ML syrup Commonly known as:  CLARITIN Take 5 mg daily as needed by mouth for allergies.   lubiprostone 24 MCG capsule Commonly known as:  AMITIZA Take 24 mcg daily with breakfast by mouth.   omeprazole 40 MG capsule Commonly known as:  PRILOSEC Take 40 mg by mouth daily.   ondansetron 4 MG  disintegrating tablet Commonly known as:  ZOFRAN-ODT Take 1 tablet (4 mg total) by mouth every 8 (eight) hours as needed for nausea or vomiting.   senna-docusate 8.6-50 MG tablet Commonly known as:  Senokot-S Take 1 tablet at bedtime by mouth.   sertraline 100 MG tablet Commonly known as:  ZOLOFT Take 100 mg by mouth daily.   tigecycline 50 mg in sodium chloride 0.9 % 100 mL Inject 50 mg into the vein every 12 (twelve) hours. Indication:  Pelvic osteomyelitis Last Day of Therapy:  04/08/17 Labs - Once weekly:  CBC/D and BMP, Labs - Every other week:  ESR and CRP            Home Infusion Instuctions  (From admission, onward)        Start     Ordered   03/12/17 0000  Home infusion instructions [facility] May follow [place] Dosing Protocol; May administer Cathflo as needed to maintain patency of vascular access device.; Flushing of vascular access device: per [facility] Protocol: 0.9% NaCl pre/post medica...    Question Answer Comment  Instructions May follow [place] Dosing Protocol   Instructions May administer Cathflo as needed to maintain patency of vascular access device.   Instructions Flushing of vascular access device: per [facility] Protocol: 0.9% NaCl pre/post medication administration and prn patency; Heparin 100 u/ml, 47m for implanted ports and Heparin 10u/ml, 557mfor all other central venous catheters.   Instructions May follow [facility] Anaphylaxis Protocol for First Dose Administration in the home: 0.9% NaCl at 25-50 ml/hr to maintain IV access for protocol meds. Epinephrine 0.3 ml IV/IM PRN and Benadryl 25-50 IV/IM PRN s/s of anaphylaxis.   Instructions [facility] Infusion Coordinator (RN) to assist per patient IV care needs in the home PRN.      03/12/17 1100     Follow-up Information    Redmon, Noelle, PA-C Follow up in 1 week(s).   Specialty:  Nurse Practitioner Why:  hospital discharge follow up Contact information: [address] [place] [phone]      follow up with wound clinic. Schedule an appointment as soon as possible for a visit in 1 week(s).        [facility] Follow up.   Specialty:  [place]:  nurse for IV antiobiotics and wound care Contact information: [phone]        SnCarlyle BasquesMD Follow up.   Specialty:  Infectious Diseases Why:  antibiotics related issue Contact information: [address] [place] [phone]        Allergies  Allergen Reactions  . Iodine Anaphylaxis  . Latex Anaphylaxis  .  Other Anaphylaxis  . Polymyxin B Hives    Pt had hives in right arm with infusion of polymyxin on November 26th, 2018  . Shellfish-Derived Products Anaphylaxis    Reaction unknown  . Vancomycin Anaphylaxis  . Aspirin Other (See Comments)    Reaction unknown  . Strawberry Extract Hives and Rash    Consultations: General surgery Infectious disease  Procedures/Studies: Surgical debridement of sacral decubiti  Subjective: Seen and examined at bedside.  Doing well.  Denied nausea vomiting chest pain shortness of breath.  Mother at bedside.  Discharge Exam: Vitals:   03/11/17 2300 03/12/17 0709  BP: (!) 94/54 (!) 100/56  Pulse: 85 86  Resp: 16 16  Temp: 98.9 F (37.2 C) 98.6 F (37 C)  SpO2: 100% 100%   Vitals:   03/11/17 1400 03/11/17 1607 03/11/17 2300 03/12/17 0709  BP: 107/63 114/64 (!) 94/54 (!) 100/56  Pulse:  87 85 86  Resp: 18 16 16 16   Temp: 99.5 F (37.5 C) 98.7 F (37.1 C) 98.9 F (37.2 C) 98.6 F (37 C)  TempSrc: Oral Oral Oral Oral  SpO2: 99% 100% 100% 100%  Weight:      Height:        General: Pt is alert, awake, not in acute distress Cardiovascular: RRR, S1/S2 +, no rubs, no gallops Respiratory: CTA bilaterally, no wheezing, no rhonchi Abdominal: Soft, NT, ND, bowel sounds + Extremities: no edema, no  cyanosis Spastic upper and lower extremities.   The results of significant diagnostics from this hospitalization (including imaging, microbiology, ancillary and laboratory) are listed below for reference.     Microbiology: No results found for this or any previous visit (from the past 240 hour(s)).   Labs: BNP (last 3 results) No results for input(s): BNP in the last 8760 hours. Basic Metabolic Panel: Recent Labs  Lab 03/06/17 0420 03/08/17 0519  NA 139 139  K 3.3* 4.0  CL 107 107  CO2 26 26  GLUCOSE 94 88  BUN 17 16  CREATININE <0.30* 0.30*  CALCIUM 8.7* 8.6*  MG 1.8  --    Liver Function Tests: Recent Labs  Lab 03/06/17 0420  AST 12*  ALT 10*  ALKPHOS 67  BILITOT 0.4  PROT 5.7*  ALBUMIN 3.4*   No results for input(s): LIPASE, AMYLASE in the last 168 hours. No results for input(s): AMMONIA in the last 168 hours. CBC: Recent Labs  Lab 03/06/17 0420 03/08/17 0519  WBC 4.6 4.2  NEUTROABS 3.0  --   HGB 7.5* 7.7*  HCT 25.4* 26.7*  MCV 70.6* 70.4*  PLT 198 199   Cardiac Enzymes: Recent Labs  Lab 03/08/17 0059 03/11/17 0421  CKTOTAL  --  10*  TROPONINI <0.03  --    BNP: Invalid input(s): POCBNP CBG: No results for input(s): GLUCAP in the last 168 hours. D-Dimer No results for input(s): DDIMER in the last 72 hours. Hgb A1c No results for input(s): HGBA1C in the last 72 hours. Lipid Profile No results for input(s): CHOL, HDL, LDLCALC, TRIG, CHOLHDL, LDLDIRECT in the last 72 hours. Thyroid function studies No results for input(s): TSH, T4TOTAL, T3FREE, THYROIDAB in the last 72 hours.  Invalid input(s): FREET3 Anemia work up No results for input(s): VITAMINB12, FOLATE, FERRITIN, TIBC, IRON, RETICCTPCT in the last 72 hours. Urinalysis    Component Value Date/Time   COLORURINE YELLOW 02/13/2017 2035   APPEARANCEUR HAZY (A) 02/13/2017 2035   LABSPEC 1.016 02/13/2017 2035   PHURINE 5.0 02/13/2017 2035   GLUCOSEU NEGATIVE  02/13/2017 2035   HGBUR  MODERATE (A) 02/13/2017 2035   BILIRUBINUR NEGATIVE 02/13/2017 2035   KETONESUR NEGATIVE 02/13/2017 2035   PROTEINUR 30 (A) 02/13/2017 2035   UROBILINOGEN 2.0 (H) 10/29/2014 1627   NITRITE POSITIVE (A) 02/13/2017 2035   LEUKOCYTESUR MODERATE (A) 02/13/2017 2035   Sepsis Labs Invalid input(s): PROCALCITONIN,  WBC,  LACTICIDVEN Microbiology No results found for this or any previous visit (from the past 240 hour(s)).   Time coordinating discharge: 35 minutes  SIGNED:   Rosita Fire, MD  [facility] 03/12/2017, 11:00 AM  If 7PM-7AM, please contact night-coverage [email or web address] Password [email or web address]

## 2017-03-20 ENCOUNTER — Other Ambulatory Visit: Payer: Self-pay | Admitting: Pharmacist

## 2017-03-26 ENCOUNTER — Encounter (HOSPITAL_COMMUNITY): Payer: Self-pay | Admitting: Emergency Medicine

## 2017-03-26 ENCOUNTER — Other Ambulatory Visit: Payer: Self-pay

## 2017-03-26 ENCOUNTER — Emergency Department (HOSPITAL_COMMUNITY): Payer: Medicaid Other

## 2017-03-26 ENCOUNTER — Inpatient Hospital Stay (HOSPITAL_COMMUNITY): Payer: Medicaid Other

## 2017-03-26 ENCOUNTER — Inpatient Hospital Stay (HOSPITAL_COMMUNITY)
Admission: EM | Admit: 2017-03-26 | Discharge: 2017-04-03 | DRG: 871 | Disposition: A | Discharge status: Skilled Nursing Facility | Payer: Medicaid Other | Attending: Internal Medicine | Admitting: Internal Medicine

## 2017-03-26 DIAGNOSIS — E861 Hypovolemia: Secondary | ICD-10-CM

## 2017-03-26 DIAGNOSIS — E2749 Other adrenocortical insufficiency: Secondary | ICD-10-CM | POA: Diagnosis present

## 2017-03-26 DIAGNOSIS — R6520 Severe sepsis without septic shock: Secondary | ICD-10-CM | POA: Diagnosis not present

## 2017-03-26 DIAGNOSIS — R197 Diarrhea, unspecified: Secondary | ICD-10-CM | POA: Diagnosis present

## 2017-03-26 DIAGNOSIS — R29810 Facial weakness: Secondary | ICD-10-CM | POA: Diagnosis not present

## 2017-03-26 DIAGNOSIS — A499 Bacterial infection, unspecified: Secondary | ICD-10-CM | POA: Diagnosis present

## 2017-03-26 DIAGNOSIS — F329 Major depressive disorder, single episode, unspecified: Secondary | ICD-10-CM | POA: Diagnosis present

## 2017-03-26 DIAGNOSIS — R6521 Severe sepsis with septic shock: Secondary | ICD-10-CM | POA: Diagnosis present

## 2017-03-26 DIAGNOSIS — D696 Thrombocytopenia, unspecified: Secondary | ICD-10-CM

## 2017-03-26 DIAGNOSIS — Z1624 Resistance to multiple antibiotics: Secondary | ICD-10-CM | POA: Diagnosis present

## 2017-03-26 DIAGNOSIS — T368X5A Adverse effect of other systemic antibiotics, initial encounter: Secondary | ICD-10-CM | POA: Diagnosis present

## 2017-03-26 DIAGNOSIS — D638 Anemia in other chronic diseases classified elsewhere: Secondary | ICD-10-CM | POA: Diagnosis not present

## 2017-03-26 DIAGNOSIS — E872 Acidosis: Secondary | ICD-10-CM | POA: Diagnosis present

## 2017-03-26 DIAGNOSIS — E869 Volume depletion, unspecified: Secondary | ICD-10-CM | POA: Diagnosis not present

## 2017-03-26 DIAGNOSIS — K521 Toxic gastroenteritis and colitis: Secondary | ICD-10-CM | POA: Diagnosis present

## 2017-03-26 DIAGNOSIS — A498 Other bacterial infections of unspecified site: Secondary | ICD-10-CM | POA: Diagnosis not present

## 2017-03-26 DIAGNOSIS — M4628 Osteomyelitis of vertebra, sacral and sacrococcygeal region: Secondary | ICD-10-CM | POA: Diagnosis present

## 2017-03-26 DIAGNOSIS — K219 Gastro-esophageal reflux disease without esophagitis: Secondary | ICD-10-CM | POA: Diagnosis present

## 2017-03-26 DIAGNOSIS — G35 Multiple sclerosis: Secondary | ICD-10-CM | POA: Diagnosis present

## 2017-03-26 DIAGNOSIS — A4902 Methicillin resistant Staphylococcus aureus infection, unspecified site: Secondary | ICD-10-CM | POA: Diagnosis not present

## 2017-03-26 DIAGNOSIS — L89324 Pressure ulcer of left buttock, stage 4: Secondary | ICD-10-CM | POA: Diagnosis present

## 2017-03-26 DIAGNOSIS — D61818 Other pancytopenia: Secondary | ICD-10-CM | POA: Diagnosis present

## 2017-03-26 DIAGNOSIS — E876 Hypokalemia: Secondary | ICD-10-CM | POA: Diagnosis present

## 2017-03-26 DIAGNOSIS — Z91040 Latex allergy status: Secondary | ICD-10-CM | POA: Diagnosis not present

## 2017-03-26 DIAGNOSIS — B964 Proteus (mirabilis) (morganii) as the cause of diseases classified elsewhere: Secondary | ICD-10-CM | POA: Diagnosis present

## 2017-03-26 DIAGNOSIS — I959 Hypotension, unspecified: Secondary | ICD-10-CM | POA: Diagnosis not present

## 2017-03-26 DIAGNOSIS — A419 Sepsis, unspecified organism: Principal | ICD-10-CM | POA: Diagnosis present

## 2017-03-26 DIAGNOSIS — Z91041 Radiographic dye allergy status: Secondary | ICD-10-CM | POA: Diagnosis not present

## 2017-03-26 DIAGNOSIS — A09 Infectious gastroenteritis and colitis, unspecified: Secondary | ICD-10-CM | POA: Diagnosis not present

## 2017-03-26 DIAGNOSIS — R7989 Other specified abnormal findings of blood chemistry: Secondary | ICD-10-CM | POA: Diagnosis not present

## 2017-03-26 DIAGNOSIS — D509 Iron deficiency anemia, unspecified: Secondary | ICD-10-CM | POA: Diagnosis present

## 2017-03-26 DIAGNOSIS — Z91018 Allergy to other foods: Secondary | ICD-10-CM

## 2017-03-26 DIAGNOSIS — G8220 Paraplegia, unspecified: Secondary | ICD-10-CM | POA: Diagnosis present

## 2017-03-26 DIAGNOSIS — N179 Acute kidney failure, unspecified: Secondary | ICD-10-CM | POA: Diagnosis present

## 2017-03-26 DIAGNOSIS — Z95828 Presence of other vascular implants and grafts: Secondary | ICD-10-CM

## 2017-03-26 DIAGNOSIS — G373 Acute transverse myelitis in demyelinating disease of central nervous system: Secondary | ICD-10-CM | POA: Diagnosis present

## 2017-03-26 DIAGNOSIS — Z91013 Allergy to seafood: Secondary | ICD-10-CM | POA: Diagnosis not present

## 2017-03-26 DIAGNOSIS — L89153 Pressure ulcer of sacral region, stage 3: Secondary | ICD-10-CM | POA: Diagnosis not present

## 2017-03-26 DIAGNOSIS — M869 Osteomyelitis, unspecified: Secondary | ICD-10-CM

## 2017-03-26 DIAGNOSIS — Z886 Allergy status to analgesic agent status: Secondary | ICD-10-CM | POA: Diagnosis not present

## 2017-03-26 DIAGNOSIS — Z936 Other artificial openings of urinary tract status: Secondary | ICD-10-CM

## 2017-03-26 DIAGNOSIS — Z9102 Food additives allergy status: Secondary | ICD-10-CM

## 2017-03-26 DIAGNOSIS — K5900 Constipation, unspecified: Secondary | ICD-10-CM | POA: Diagnosis present

## 2017-03-26 DIAGNOSIS — R400 Somnolence: Secondary | ICD-10-CM

## 2017-03-26 DIAGNOSIS — I9589 Other hypotension: Secondary | ICD-10-CM | POA: Diagnosis not present

## 2017-03-26 DIAGNOSIS — L89323 Pressure ulcer of left buttock, stage 3: Secondary | ICD-10-CM | POA: Diagnosis present

## 2017-03-26 DIAGNOSIS — Z881 Allergy status to other antibiotic agents status: Secondary | ICD-10-CM

## 2017-03-26 DIAGNOSIS — B9562 Methicillin resistant Staphylococcus aureus infection as the cause of diseases classified elsewhere: Secondary | ICD-10-CM | POA: Diagnosis present

## 2017-03-26 DIAGNOSIS — B9689 Other specified bacterial agents as the cause of diseases classified elsewhere: Secondary | ICD-10-CM | POA: Diagnosis present

## 2017-03-26 LAB — COMPREHENSIVE METABOLIC PANEL
ALT: 26 U/L (ref 14–54)
AST: 34 U/L (ref 15–41)
Albumin: 2.3 g/dL — ABNORMAL LOW (ref 3.5–5.0)
Alkaline Phosphatase: 153 U/L — ABNORMAL HIGH (ref 38–126)
Anion gap: 14 (ref 5–15)
BUN: 31 mg/dL — AB (ref 6–20)
CHLORIDE: 105 mmol/L (ref 101–111)
CO2: 18 mmol/L — AB (ref 22–32)
CREATININE: 1.37 mg/dL — AB (ref 0.44–1.00)
Calcium: 7.7 mg/dL — ABNORMAL LOW (ref 8.9–10.3)
GFR calc Af Amer: 57 mL/min — ABNORMAL LOW (ref >60)
GFR, EST NON AFRICAN AMERICAN: 49 mL/min — AB (ref >60)
Glucose, Bld: 154 mg/dL — ABNORMAL HIGH (ref 65–99)
POTASSIUM: 3.9 mmol/L (ref 3.5–5.1)
SODIUM: 137 mmol/L (ref 135–145)
Total Bilirubin: 0.8 mg/dL (ref 0.3–1.2)
Total Protein: 4.5 g/dL — ABNORMAL LOW (ref 6.5–8.1)

## 2017-03-26 LAB — DIFFERENTIAL
BASOS ABS: 0 10*3/uL (ref 0.0–0.1)
Basophils Relative: 0 %
Eosinophils Absolute: 0 10*3/uL (ref 0.0–0.7)
Eosinophils Relative: 0 %
LYMPHS ABS: 2.2 10*3/uL (ref 0.7–4.0)
Lymphocytes Relative: 5 %
MONO ABS: 2.2 10*3/uL — AB (ref 0.1–1.0)
MONOS PCT: 5 %
NEUTROS PCT: 90 %
Neutro Abs: 39.6 10*3/uL — ABNORMAL HIGH (ref 1.7–7.7)

## 2017-03-26 LAB — URINALYSIS, ROUTINE W REFLEX MICROSCOPIC
BILIRUBIN URINE: NEGATIVE
Glucose, UA: NEGATIVE mg/dL
KETONES UR: NEGATIVE mg/dL
Nitrite: NEGATIVE
PROTEIN: 30 mg/dL — AB
Specific Gravity, Urine: 1.014 (ref 1.005–1.030)
pH: 6 (ref 5.0–8.0)

## 2017-03-26 LAB — BLOOD GAS, ARTERIAL
Acid-base deficit: 2.8 mmol/L — ABNORMAL HIGH (ref 0.0–2.0)
Bicarbonate: 22.3 mmol/L (ref 20.0–28.0)
Drawn by: 225631
FIO2: 21
O2 Saturation: 97.9 %
PCO2 ART: 41.6 mmHg (ref 32.0–48.0)
PH ART: 7.345 — AB (ref 7.350–7.450)
PO2 ART: 103 mmHg (ref 83.0–108.0)
Patient temperature: 97.7

## 2017-03-26 LAB — LACTIC ACID, PLASMA
Lactic Acid, Venous: 2.3 mmol/L (ref 0.5–1.9)
Lactic Acid, Venous: 1.3 mmol/L (ref 0.5–1.9)

## 2017-03-26 LAB — C DIFFICILE QUICK SCREEN W PCR REFLEX
C DIFFICLE (CDIFF) ANTIGEN: NEGATIVE
C Diff interpretation: NOT DETECTED
C Diff toxin: NEGATIVE

## 2017-03-26 LAB — PREGNANCY, URINE: Preg Test, Ur: NEGATIVE

## 2017-03-26 LAB — CBC
HEMATOCRIT: 43.2 % (ref 36.0–46.0)
Hemoglobin: 12.8 g/dL (ref 12.0–15.0)
MCH: 20.6 pg — AB (ref 26.0–34.0)
MCHC: 29.6 g/dL — ABNORMAL LOW (ref 30.0–36.0)
MCV: 69.5 fL — AB (ref 78.0–100.0)
PLATELETS: 438 10*3/uL — AB (ref 150–400)
RBC: 6.22 MIL/uL — AB (ref 3.87–5.11)
RDW: 21.1 % — AB (ref 11.5–15.5)
WBC: 44 10*3/uL — AB (ref 4.0–10.5)

## 2017-03-26 LAB — I-STAT BETA HCG BLOOD, ED (MC, WL, AP ONLY)

## 2017-03-26 LAB — I-STAT CG4 LACTIC ACID, ED
LACTIC ACID, VENOUS: 2.5 mmol/L — AB (ref 0.5–1.9)
Lactic Acid, Venous: 4.07 mmol/L (ref 0.5–1.9)

## 2017-03-26 LAB — HIV ANTIBODY (ROUTINE TESTING W REFLEX): HIV Screen 4th Generation wRfx: NONREACTIVE

## 2017-03-26 LAB — MRSA PCR SCREENING: MRSA by PCR: NEGATIVE

## 2017-03-26 LAB — LIPASE, BLOOD: LIPASE: 23 U/L (ref 11–51)

## 2017-03-26 LAB — CORTISOL: Cortisol, Plasma: 23.1 ug/dL

## 2017-03-26 MED ORDER — ORAL CARE MOUTH RINSE
15.0000 mL | Freq: Two times a day (BID) | OROMUCOSAL | Status: DC
Start: 2017-03-27 — End: 2017-04-03
  Administered 2017-03-27 – 2017-04-03 (×10): 15 mL via OROMUCOSAL

## 2017-03-26 MED ORDER — LAMOTRIGINE 25 MG PO TABS
125.0000 mg | ORAL_TABLET | Freq: Every day | ORAL | Status: DC
  Administered 2017-03-26: 125 mg via ORAL
  Filled 2017-03-26: qty 1

## 2017-03-26 MED ORDER — SODIUM BICARBONATE 8.4 % IV SOLN
INTRAVENOUS | Status: DC
  Administered 2017-03-26: 17:00:00 via INTRAVENOUS
  Filled 2017-03-26 (×2): qty 150

## 2017-03-26 MED ORDER — DIAZEPAM 5 MG PO TABS: 5.0000 mg | ORAL_TABLET | Freq: Three times a day (TID) | ORAL | Status: DC

## 2017-03-26 MED ORDER — ONDANSETRON HCL 4 MG/2ML IJ SOLN: 4.0000 mg | Freq: Four times a day (QID) | INTRAMUSCULAR | Status: DC | PRN

## 2017-03-26 MED ORDER — NOREPINEPHRINE BITARTRATE 1 MG/ML IV SOLN
0.0000 ug/min | INTRAVENOUS | Status: DC
  Filled 2017-03-26: qty 4

## 2017-03-26 MED ORDER — HYDROCORTISONE NA SUCCINATE PF 100 MG IJ SOLR
50.0000 mg | Freq: Four times a day (QID) | INTRAMUSCULAR | Status: DC
  Administered 2017-03-26 – 2017-03-28 (×7): 50 mg via INTRAVENOUS
  Filled 2017-03-26 (×7): qty 2

## 2017-03-26 MED ORDER — SODIUM CHLORIDE 0.9 % IV SOLN
INTRAVENOUS | Status: DC
  Administered 2017-03-26 (×2): via INTRAVENOUS

## 2017-03-26 MED ORDER — BACLOFEN 10 MG PO TABS
20.0000 mg | ORAL_TABLET | Freq: Three times a day (TID) | ORAL | Status: DC
  Administered 2017-03-26: 20 mg via ORAL
  Filled 2017-03-26: qty 2

## 2017-03-26 MED ORDER — ACETAMINOPHEN 325 MG PO TABS: 650.0000 mg | ORAL_TABLET | Freq: Four times a day (QID) | ORAL | Status: DC | PRN

## 2017-03-26 MED ORDER — LUBIPROSTONE 24 MCG PO CAPS
24.0000 ug | ORAL_CAPSULE | Freq: Every day | ORAL | Status: DC
  Administered 2017-03-26: 24 ug via ORAL
  Filled 2017-03-26: qty 1

## 2017-03-26 MED ORDER — ONDANSETRON HCL 4 MG/2ML IJ SOLN: 4.0000 mg | Freq: Once | INTRAMUSCULAR | Status: DC | PRN

## 2017-03-26 MED ORDER — LORATADINE 10 MG PO TABS
5.0000 mg | ORAL_TABLET | Freq: Every day | ORAL | Status: DC | PRN
  Administered 2017-03-30 – 2017-03-31 (×2): 5 mg via ORAL
  Filled 2017-03-26: qty 0.5
  Filled 2017-03-26 (×2): qty 1

## 2017-03-26 MED ORDER — SODIUM CHLORIDE 0.9 % IV BOLUS (SEPSIS)
1000.0000 mL | Freq: Once | INTRAVENOUS | Status: AC
  Administered 2017-03-26: 1000 mL via INTRAVENOUS

## 2017-03-26 MED ORDER — PHENYLEPHRINE HCL-NACL 10-0.9 MG/250ML-% IV SOLN: 0.0000 ug/min | INTRAVENOUS | Status: DC

## 2017-03-26 MED ORDER — IBUPROFEN 200 MG PO TABS: 200.0000 mg | ORAL_TABLET | Freq: Four times a day (QID) | ORAL | Status: DC | PRN

## 2017-03-26 MED ORDER — PHENYLEPHRINE HCL-NACL 10-0.9 MG/250ML-% IV SOLN
INTRAVENOUS | Status: AC
Start: 2017-03-26 — End: 2017-03-26
  Administered 2017-03-26: 200 ug/min
  Filled 2017-03-26: qty 250

## 2017-03-26 MED ORDER — FLUTICASONE PROPIONATE 50 MCG/ACT NA SUSP
2.0000 | Freq: Every day | NASAL | Status: DC | PRN
  Administered 2017-03-30 – 2017-03-31 (×3): 2 via NASAL
  Filled 2017-03-26 (×2): qty 16

## 2017-03-26 MED ORDER — PIPERACILLIN-TAZOBACTAM 3.375 G IVPB
3.3750 g | Freq: Three times a day (TID) | INTRAVENOUS | Status: DC
  Administered 2017-03-26 – 2017-04-01 (×19): 3.375 g via INTRAVENOUS
  Filled 2017-03-26 (×19): qty 50

## 2017-03-26 MED ORDER — GABAPENTIN 400 MG PO CAPS
800.0000 mg | ORAL_CAPSULE | Freq: Three times a day (TID) | ORAL | Status: DC
  Administered 2017-03-26: 800 mg via ORAL
  Filled 2017-03-26: qty 2

## 2017-03-26 MED ORDER — NOREPINEPHRINE 4 MG/250ML-% IV SOLN
0.0000 ug/min | INTRAVENOUS | Status: DC
  Administered 2017-03-26: 20 ug/min via INTRAVENOUS
  Filled 2017-03-26 (×2): qty 250

## 2017-03-26 MED ORDER — CHLORHEXIDINE GLUCONATE 0.12 % MT SOLN
15.0000 mL | Freq: Two times a day (BID) | OROMUCOSAL | Status: DC
  Administered 2017-03-27 – 2017-04-03 (×16): 15 mL via OROMUCOSAL
  Filled 2017-03-26 (×16): qty 15

## 2017-03-26 MED ORDER — DIAZEPAM 5 MG PO TABS: 5.0000 mg | ORAL_TABLET | ORAL | Status: DC

## 2017-03-26 MED ORDER — PIPERACILLIN-TAZOBACTAM 3.375 G IVPB 30 MIN
3.3750 g | Freq: Once | INTRAVENOUS | Status: AC
  Administered 2017-03-26: 3.375 g via INTRAVENOUS
  Filled 2017-03-26: qty 50

## 2017-03-26 MED ORDER — DIAZEPAM 5 MG PO TABS: 10.0000 mg | ORAL_TABLET | Freq: Every day | ORAL | Status: DC

## 2017-03-26 MED ORDER — LINEZOLID 600 MG PO TABS
600.0000 mg | ORAL_TABLET | Freq: Two times a day (BID) | ORAL | Status: DC
  Administered 2017-03-26 – 2017-03-29 (×8): 600 mg via ORAL
  Filled 2017-03-26 (×10): qty 1

## 2017-03-26 MED ORDER — ONDANSETRON HCL 4 MG PO TABS: 4.0000 mg | ORAL_TABLET | Freq: Four times a day (QID) | ORAL | Status: DC | PRN

## 2017-03-26 MED ORDER — PANTOPRAZOLE SODIUM 40 MG PO TBEC
80.0000 mg | DELAYED_RELEASE_TABLET | Freq: Every day | ORAL | Status: DC
  Administered 2017-03-26 – 2017-03-27 (×2): 80 mg via ORAL
  Filled 2017-03-26 (×2): qty 2

## 2017-03-26 MED ORDER — ACETAMINOPHEN 650 MG RE SUPP: 650.0000 mg | Freq: Four times a day (QID) | RECTAL | Status: DC | PRN

## 2017-03-26 MED ORDER — ENOXAPARIN SODIUM 40 MG/0.4ML ~~LOC~~ SOLN
40.0000 mg | SUBCUTANEOUS | Status: DC
Start: 2017-03-26 — End: 2017-03-26

## 2017-03-26 MED ORDER — ONDANSETRON 4 MG PO TBDP
4.0000 mg | ORAL_TABLET | Freq: Two times a day (BID) | ORAL | Status: DC
  Administered 2017-03-26: 4 mg via ORAL
  Filled 2017-03-26: qty 1

## 2017-03-26 MED ORDER — DIPHENHYDRAMINE HCL 50 MG PO CAPS
50.0000 mg | ORAL_CAPSULE | Freq: Two times a day (BID) | ORAL | Status: DC
  Administered 2017-03-26: 50 mg via ORAL
  Filled 2017-03-26: qty 2

## 2017-03-26 MED ORDER — SERTRALINE HCL 100 MG PO TABS
100.0000 mg | ORAL_TABLET | Freq: Three times a day (TID) | ORAL | Status: DC
  Administered 2017-03-26: 100 mg via ORAL
  Filled 2017-03-26: qty 2

## 2017-03-26 MED ORDER — ALBUMIN HUMAN 25 % IV SOLN
25.0000 g | Freq: Once | INTRAVENOUS | Status: AC
  Administered 2017-03-26: 25 g via INTRAVENOUS
  Filled 2017-03-26: qty 100

## 2017-03-26 MED ORDER — SODIUM CHLORIDE 0.9 % IV BOLUS (SEPSIS)
2000.0000 mL | Freq: Once | INTRAVENOUS | Status: AC
  Administered 2017-03-26: 2000 mL via INTRAVENOUS

## 2017-03-26 MED ORDER — ENOXAPARIN SODIUM 40 MG/0.4ML ~~LOC~~ SOLN
40.0000 mg | Freq: Every day | SUBCUTANEOUS | Status: DC
Start: 2017-03-26 — End: 2017-04-03
  Administered 2017-03-26 – 2017-04-02 (×8): 40 mg via SUBCUTANEOUS
  Filled 2017-03-26 (×8): qty 0.4

## 2017-03-26 NOTE — ED Notes (Signed)
Alcario Drought, MD made aware of patient's trending hypotension, verbal order to bolus patient 1L NS

## 2017-03-26 NOTE — ED Notes (Signed)
Bed: DY51 Expected date:  Expected time:  Means of arrival:  Comments: EMS 35 F Diarrhea, paraplegic

## 2017-03-26 NOTE — Consult Note (Signed)
Index PCCM PROGRESS NOTE  Date of Admission: 03/26/2017 Date of Consult: 03/26/2017 Referring Provider: Dr. Nevada Crane, [facility] Chief Complaint: Low blood pressure  HPI: 36 yo female with hx of paraplegia in setting of transverse myelitis and MS was in hospital from 02/13/17 to 03/12/17 with sacral pressure ulcer with osteomyelitis.  She was d/c home on IV Abx through PICC line.  Her mother remotes she started getting nausea, vomiting and diarrhea after eating something funny.  She hasn't been each much since.  She had stool for C diff that was negative.  ID was consulted and abx adjusted.  She has required multiple boluses of IV fluid, but blood pressure remains low.  She has significant worsening of her creatinine and increase in her hemoglobin compared to recent admission, and these would be consistent with significant volume depletion.  She had elevated lactic acid and this has trended down.  She was still getting her medications including baclofen, valium benadryl, neurontin, lamictal, and zoloft.  She was started on pressor agents.  Her mother reports her normal blood pressure is 90's to low 100's.  Past Medical History: She  has a past medical history of Bladder stones, Depression, GERD (gastroesophageal reflux disease), Multiple sclerosis ([place]), Paraplegia ([place]), PTSD (post-traumatic stress disorder), and Transverse myelitis ([place]).  Past Surgical History: She  has a past surgical history that includes Tonsillectomy; Cholecystectomy; Cystectomy w/ ureteroileal conduit; Wisdom tooth extraction; Irrigation and debridement abscess (N/A, 02/18/2017); IR Fluoro Guide CV Line Left (02/25/2017); and IR US Guide Vasc Access Left (02/25/2017).  Family History: Her family history includes Breast cancer in her other; CAD in her father; Dementia in her father; Diabetes type II in her father, mother, and other; Hypertension in her mother; Seizures in her father; Stroke in her father.  Social History: She   reports that  has never smoked. she has never used smokeless tobacco. She reports that she does not drink alcohol or use drugs.   Allergies  Allergen Reactions  . Iodine Anaphylaxis  . Latex Anaphylaxis  . Polymyxin B Hives    Pt had hives in right arm with infusion of polymyxin on November 26th, 2018  . Shellfish-Derived Products Anaphylaxis    Reaction unknown  . Vancomycin Anaphylaxis  . Aspirin Other (See Comments)    Reaction unknown  . Strawberry Extract Hives and Rash    No current facility-administered medications on file prior to encounter.    Current Outpatient Medications on File Prior to Encounter  Medication Sig  . acetaminophen (TYLENOL) 325 MG tablet Take 650 mg by mouth every 6 (six) hours as needed for fever.  . baclofen (LIORESAL) 20 MG tablet Take 20 mg by mouth 3 (three) times daily.  . bisacodyl (DULCOLAX) 10 MG suppository Place 1 suppository (10 mg total) daily rectally. (Patient taking differently: Place 10 mg rectally daily as needed for mild constipation. )  . diazepam (VALIUM) 5 MG tablet Take 1-2 tablets (5-10 mg total) by mouth every 8 (eight) hours. Take 1 tablet in AM, 1 tablet in PM, and 2 tablets at bedtime (Patient taking differently: Take 5-10 mg by mouth 3 (three) times daily. Take 1 tablet in AM, 1 tablet in PM, and 2 tablets at bedtime)  . diphenhydrAMINE (BENADRYL) 25 mg capsule Take 1 capsule (25 mg total) by mouth every 8 (eight) hours as needed for itching. (Patient taking differently: Take 50 mg by mouth every 12 (twelve) hours. )  . fluticasone (FLONASE) 50 MCG/ACT nasal spray Place 2 sprays into the  nose daily as needed for allergies.   Marland Kitchen gabapentin (NEURONTIN) 400 MG capsule Take 2 capsules (800 mg total) by mouth 3 (three) times daily.  Marland Kitchen ibuprofen (ADVIL,MOTRIN) 200 MG tablet Take 200-400 mg by mouth every 6 (six) hours as needed for headache or moderate pain.   Marland Kitchen lamoTRIgine (LAMICTAL) 25 MG tablet Take 125 mg by mouth daily.  Marland Kitchen  levofloxacin (LEVAQUIN) 750 MG tablet Take 1 tablet (750 mg total) by mouth daily for 28 days. (Patient taking differently: Take 750 mg by mouth daily. Started 12/13)  . loratadine (CLARITIN) 5 MG/5ML syrup Take 5 mg daily as needed by mouth for allergies.   Marland Kitchen lubiprostone (AMITIZA) 24 MCG capsule Take 24 mcg daily with breakfast by mouth.   Marland Kitchen omeprazole (PRILOSEC) 40 MG capsule Take 40 mg by mouth daily.  . ondansetron (ZOFRAN-ODT) 4 MG disintegrating tablet Take 1 tablet (4 mg total) by mouth every 8 (eight) hours as needed for nausea or vomiting. (Patient taking differently: Take 4 mg by mouth every 12 (twelve) hours. )  . senna-docusate (SENOKOT-S) 8.6-50 MG tablet Take 1 tablet at bedtime by mouth. (Patient taking differently: Take 1 tablet by mouth daily as needed for moderate constipation. )  . sertraline (ZOLOFT) 100 MG tablet Take 100 mg by mouth 3 (three) times daily.   . tigecycline 50 mg in sodium chloride 0.9 % 100 mL Inject 50 mg into the vein every 12 (twelve) hours. Indication:  Pelvic osteomyelitis Last Day of Therapy:  04/08/17 Labs - Once weekly:  CBC/D and BMP, Labs - Every other week:  ESR and CRP (Patient taking differently: Inject 50 mg into the vein every 12 (twelve) hours. Indication:  Pelvic osteomyelitis Last Day of Therapy:  04/08/17 Labs - Once weekly:  CBC/D and BMP, Labs - Every other week:  ESR and CRP Started 12/13)    ROS: Unable to obtain due to altered mental status  Subjective:  Vital signs: BP (!) 77/36   Pulse 88   Temp (!) 96.6 F (35.9 C) (Rectal)   Resp 12   Ht 5' 3"  (1.6 m)   Wt 134 lb 14.7 oz (61.2 kg)   SpO2 100%   BMI 23.90 kg/m   Intake/Output: I/O last 3 completed shifts: In: 2050 [IV Piggyback:2050] Out: -   Physical Exam:  General - laying in bed Eyes - pupils reactive, dilated ENT - no sinus tenderness, no oral exudate, no LAN Cardiac - regular, no murmur Chest - no wheeze, rales Abd - soft, mild distention, urostomy bag in  place, non tender, rectal tube in place with liquid stools Ext - 1+ lower extremity edema Skin - sacral pressure wound Neuro - can localize to voice, no verbal Psych - normal mood  Discussion: 36 yo female with hypotension from sepsis and volume depletion in setting of nausea, vomiting, and diarrhea.  Source of sepsis is likely sacral wound.  She could also have relative adrenal insufficiency.  Altered mental status likely related to metabolic derangements and ineffective clearance of her medications in setting of renal insufficiency.  Assessment/Plan:  Sepsis likely from sacral wound. - previous wound cultures grew Proteus, MRSA, MDRO/CRE Acinetobacter Abx per ID - need to use caution with linezolid and her other medications with possibility of developing serotonin syndrome - f/u cultures - check cortisol level; start empiric solu cortef pending results - continue levophed to keep MAP > 65  Volume depletion - continue IV fluids  Acute renal failure 2nd to volume depletion. Non anion  gap metabolic acidosis. - change IV fluids to D5W with 3 amps HCO3 at 125 ml/hr - f/u BMET  Acute encephalopathy 2nd to metabolic derangements and medications. 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 - monitor mental status - hold baclofen, valium, benadryl, neurontin, lamictal until mental status improved - hold zoloft while on linezolid due to concern for developing serotonin syndrome  Nausea, vomiting, diarrhea. - hold outpt amitiza - would avoid zofran in setting of linezolid use due to potential for developing serotonin syndrome  DVT prophylaxis: Lovenox SUP: Protonix Diet: Regular diet Goals of care: Full code  Updated pt's mother at bedside.  CC time 33 minutes.  Chesley Mires, MD [facility] 03/26/2017, 3:55 PM Pager:  [phone] After 3pm call: [phone]  FLOW SHEET  Cultures: GI Panel 12/25  >> Blood 12/25 >> Urine 12/25 >> C diff PCR 12/25 >> negative  Antibiotics: Zosyn 12/24 >> Linezolid 12/24 >>  Studies: MR Pelvis 11/17 >> deep soft tissue ulcer extending to the left ischial tuberosity, early osteomyelitis, proctitis  Events: 12/24 Admit   Lines/tubes: Lt PICC 11/26 >>   Consults: 12/25 ID  Resolved Problems: Lactic acidosis  Labs: CMP Latest Ref Rng & Units 03/26/2017 03/08/2017 03/06/2017  Glucose 65 - 99 mg/dL 154(H) 88 94  BUN 6 - 20 mg/dL 31(H) 16 17  Creatinine 0.44 - 1.00 mg/dL 1.37(H) 0.30(L) <0.30(L)  Sodium 135 - 145 mmol/L 137 139 139  Potassium 3.5 - 5.1 mmol/L 3.9 4.0 3.3(L)  Chloride 101 - 111 mmol/L 105 107 107  CO2 22 - 32 mmol/L 18(L) 26 26  Calcium 8.9 - 10.3 mg/dL 7.7(L) 8.6(L) 8.7(L)  Total Protein 6.5 - 8.1 g/dL 4.5(L) - 5.7(L)  Total Bilirubin 0.3 - 1.2 mg/dL 0.8 - 0.4  Alkaline Phos 38 - 126 U/L 153(H) - 67  AST 15 - 41 U/L 34 - 12(L)  ALT 14 - 54 U/L 26 - 10(L)    CBC Latest Ref Rng & Units 03/26/2017 03/08/2017 03/06/2017  WBC 4.0 - 10.5 K/uL 44.0(H) 4.2 4.6  Hemoglobin 12.0 - 15.0 g/dL 12.8 7.7(L) 7.5(L)  Hematocrit 36.0 - 46.0 % 43.2 26.7(L) 25.4(L)  Platelets 150 - 400 K/uL 438(H) 199 198    CBG (last 3)  No results for input(s): GLUCAP in the last 72 hours.  Imaging: Dg Chest Port 1 View  Result Date: 03/26/2017 CLINICAL DATA:  Acute onset of hypotension. EXAM: PORTABLE CHEST 1 VIEW COMPARISON:  Chest radiograph performed 03/07/2017 FINDINGS: The lungs are well-aerated and clear. There is no evidence of focal opacification, pleural effusion or pneumothorax. The cardiomediastinal silhouette is within normal limits. No acute osseous abnormalities are seen. A left PICC is noted ending about the distal SVC. IMPRESSION: No acute cardiopulmonary process seen. Electronically Signed   By: Garald Balding M.D.   On: 03/26/2017 04:04

## 2017-03-26 NOTE — ED Notes (Signed)
I gave crital I Stat CG4 result to MD Roxanne Mins

## 2017-03-26 NOTE — Progress Notes (Addendum)
RN, Alroy Dust, paged this NP to say that pt was more sleepy, less responsive than earlier, had a left facial droop with no response to pain in the LUE. NP to bedside. S: pt agrees that she is more sleepy. [name] is at bedside and says that she "looks" like this from time to time over that past month or so, but agrees that the facial droop and less participation in conversation and care is new.  O: Poor appearing young WF in NAD. VS-Afebrile. Pulse-89, BP 109-1 teens. RR 8-10. SaO2 100% on 2L per O2 [place]. Card: RRR. Lungs: no audible wheezing. No increased WOB. Normal respiratory effort. Neuro: Opens eyes to name calling. Smile is not symmetrical with noted minimal left facial droop with smiling. PERRL. Grips-Right 5/5, Left 3+/5. Minimal response to pain on LUE, normal response to RUE. LUE drifts when held up. 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  Speech is slow but not slurred. Knows her name.  A/P: 1. Change in mental status-as NP hasn't seen pt before and both parent and RN think this is a change, will get a stat CT head. ABG without hypercarbia.   CRITICAL CARE Performed by: Clance Boll Total critical care time: 60 minutes Critical care time was exclusive of separately billable procedures and treating other patients. Critical care was necessary to treat or prevent imminent or life-threatening deterioration. Critical care was time spent personally by me on the following activities: development of treatment plan with patient and/or surrogate as well as nursing, discussions with consultants, evaluation of patient's response to treatment, examination of patient, obtaining history from patient or surrogate, ordering and performing treatments and interventions, ordering and review of laboratory studies, ordering and review of radiographic studies, pulse oximetry and re-evaluation of patient's condition.  KJKG, NP [facility] Addendum: CT head neg for acute issues. Will continue to follow.  Discussed result with mother.  KJKG, NP [facility]

## 2017-03-26 NOTE — ED Provider Notes (Signed)
[facility] DEPT Provider Note   CSN: [id number] Arrival date & time: 03/26/17  0204     History   Chief Complaint Chief Complaint  Patient presents with  . Hypotension  . Diarrhea    HPI [name] is a 36 y.o. female.  The history is provided by the patient.  She had onset this evening of vomiting and diarrhea.  Vomiting has subsided but she continues to have diarrhea.  She does relate that she has a wound in the left gluteal area and may have been working hard to keep it from getting contaminated from stool but have been unsuccessful.  She has significant past history of paraplegia, multiple sclerosis, MRSA, left ischial decubitus ulcer.  She denies fever or chills and denies any pain.  There have been no known sick contacts.  Past Medical History:  Diagnosis Date  . Bladder stones    Hx of  . Depression   . GERD (gastroesophageal reflux disease)   . Multiple sclerosis ([place])   . Paraplegia ([place])   . PTSD (post-traumatic stress disorder)   . Transverse myelitis [facility])     Patient Active Problem List   Diagnosis Date Noted  . MDR Acinetobacter baumannii infection   . Allergic reaction   . Acinetobacter lwoffi infection   . Proteus infection   . MRSA infection   . Wound disruption 02/13/2017  . Decubitus ulcer of left ischial area, stage III ([place]) 11/15/2016  . Sepsis ([facility]) 11/14/2016  . Dehydration 11/14/2016  . BV (bacterial vaginosis) 07/17/2016  . Decubitus ulcer of buttock, stage 4 ([place]) 07/17/2016  . Fecal incontinence 02/23/2016  . Osteomyelitis, pelvis ([place]) 02/16/2016  . Myositis 02/16/2016  . Decubitus ulcer 02/15/2016  . Pressure injury of skin 01/08/2016  . Ileus ([place]) 10/29/2014  . Constipation 10/29/2014  . Obstipation 09/27/2014  . UTI (urinary tract infection) 03/31/2013  . Influenza 03/30/2013  . N&V (nausea and vomiting) 03/30/2013  . Hypokalemia 02/16/2012  . Multiple sclerosis ([place]) 10/16/2011  .  Paraplegia ([place])   . History of transverse myelitis     Past Surgical History:  Procedure Laterality Date  . CHOLECYSTECTOMY    . CYSTECTOMY W/ URETEROILEAL CONDUIT    . IR FLUORO GUIDE CV LINE LEFT  02/25/2017  . IR US GUIDE VASC ACCESS LEFT  02/25/2017  . IRRIGATION AND DEBRIDEMENT ABSCESS N/A 02/18/2017   Procedure: IRRIGATION AND DEBRIDEMENT ABSCESS;  Surgeon: Excell Seltzer, MD;  Location: [facility];  Service: General;  Laterality: N/A;  . TONSILLECTOMY    . WISDOM TOOTH EXTRACTION      OB History    Gravida Para Term Preterm AB Living   0 0 0 0 0 0   SAB TAB Ectopic Multiple Live Births   0 0 0 0 0       Home Medications    Prior to Admission medications   Medication Sig Start Date End Date Taking? Authorizing Provider  acetaminophen (TYLENOL) 325 MG tablet Take 650 mg by mouth every 6 (six) hours as needed for fever.    [provider]  baclofen (LIORESAL) 20 MG tablet Take 20 mg by mouth 3 (three) times daily.    [provider]  bisacodyl (DULCOLAX) 10 MG suppository Place 1 suppository (10 mg total) daily rectally. 02/15/17   Florencia Reasons, MD  diazepam (VALIUM) 5 MG tablet Take 1-2 tablets (5-10 mg total) by mouth every 8 (eight) hours. Take 1 tablet in AM, 1 tablet in PM, and  2 tablets at bedtime Patient taking differently: Take 5-10 mg by mouth 3 (three) times daily. Take 1 tablet in AM, 1 tablet in PM, and 2 tablets at bedtime 04/02/13   Robbie Lis, MD  diphenhydrAMINE (BENADRYL) 25 mg capsule Take 1 capsule (25 mg total) by mouth every 8 (eight) hours as needed for itching. 03/12/17   Rosita Fire, MD  fluticasone [facility]) 50 MCG/ACT nasal spray Place 2 sprays into the nose daily as needed for allergies.     [provider]  gabapentin (NEURONTIN) 400 MG capsule Take 2 capsules (800 mg total) by mouth 3 (three) times daily. 08/13/16   Pieter Partridge, DO  ibuprofen (ADVIL,MOTRIN) 200 MG tablet Take 200 mg every 6 (six) hours as  needed by mouth for headache or moderate pain.     [provider]  lamoTRIgine (LAMICTAL) 25 MG tablet Take 125 mg by mouth daily.    [provider]  levofloxacin (LEVAQUIN) 750 MG tablet Take 1 tablet (750 mg total) by mouth daily for 28 days. 03/12/17 04/09/17  Rosita Fire, MD  loratadine (CLARITIN) 5 MG/5ML syrup Take 5 mg daily as needed by mouth for allergies.     [provider]  lubiprostone (AMITIZA) 24 MCG capsule Take 24 mcg daily with breakfast by mouth.     [provider]  omeprazole (PRILOSEC) 40 MG capsule Take 40 mg by mouth daily.    [provider]  ondansetron (ZOFRAN-ODT) 4 MG disintegrating tablet Take 1 tablet (4 mg total) by mouth every 8 (eight) hours as needed for nausea or vomiting. 03/12/17   Rosita Fire, MD  senna-docusate (SENOKOT-S) 8.6-50 MG tablet Take 1 tablet at bedtime by mouth. 02/15/17   Florencia Reasons, MD  sertraline (ZOLOFT) 100 MG tablet Take 100 mg by mouth daily.    [provider]  tigecycline 50 mg in sodium chloride 0.9 % 100 mL Inject 50 mg into the vein every 12 (twelve) hours. Indication:  Pelvic osteomyelitis Last Day of Therapy:  04/08/17 Labs - Once weekly:  CBC/D and BMP, Labs - Every other week:  ESR and CRP 03/12/17   Rosita Fire, MD    Family History Family History  Problem Relation Age of Onset  . Diabetes type II Father   . CAD Father   . Dementia Father   . Stroke Father   . Seizures Father   . Diabetes type II Mother   . Hypertension Mother   . Breast cancer Other   . Diabetes type II Other     Social History Social History   Tobacco Use  . Smoking status: Never Smoker  . Smokeless tobacco: Never Used  Substance Use Topics  . Alcohol use: No  . Drug use: No     Allergies   Iodine; Latex; Other; Polymyxin b; Shellfish-derived products; Vancomycin; Aspirin; and Strawberry extract   Review of Systems Review of Systems  All other systems  reviewed and are negative.    Physical Exam Updated Vital Signs BP (!) 71/52   Pulse (!) 122   Temp (!) 97.4 F (36.3 C) (Oral)   Resp 16   SpO2 98%   Physical Exam  Nursing note and vitals reviewed.  36 year old female, resting comfortably and in no acute distress. Vital signs are significant for tachycardia and severe hypotension. Oxygen saturation is 98%, which is normal. Head is normocephalic and atraumatic. PERRLA, EOMI. Oropharynx is clear. Neck is nontender and supple without  adenopathy or JVD. Back is nontender and there is no CVA tenderness. Lungs are clear without rales, wheezes, or rhonchi. Chest is nontender. Heart has regular rate and rhythm without murmur. Abdomen is soft, flat, nontender without masses or hepatosplenomegaly and peristalsis is normoactive.  Ileal conduit present in the right lower quadrant. Extremities have no cyanosis or edema, full range of motion is present.  Left ischial decubitus ulcer appears mostly closed.  There is some stool contamination and some erythema of the skin, but no evidence of any drainage. Skin is warm and dry without other rash. Neurologic: Mental status is normal, cranial nerves are intact.  Paraplegia present.  ED Treatments / Results  Labs (all labs ordered are listed, but only abnormal results are displayed) Labs Reviewed  CBC - Abnormal; Notable for the following components:      Result Value   WBC 44.0 (*)    RBC 6.22 (*)    MCV 69.5 (*)    MCH 20.6 (*)    MCHC 29.6 (*)    RDW 21.1 (*)    Platelets 438 (*)    All other components within normal limits  I-STAT CG4 LACTIC ACID, ED - Abnormal; Notable for the following components:   Lactic Acid, Venous 4.07 (*)    All other components within normal limits  CULTURE, BLOOD (ROUTINE X 2)  CULTURE, BLOOD (ROUTINE X 2)  URINE CULTURE  GASTROINTESTINAL PANEL BY PCR, STOOL (REPLACES STOOL CULTURE)  C DIFFICILE QUICK SCREEN W PCR REFLEX  PREGNANCY, URINE  LIPASE,  BLOOD  COMPREHENSIVE METABOLIC PANEL  URINALYSIS, ROUTINE W REFLEX MICROSCOPIC  I-STAT BETA HCG BLOOD, ED ([facility], [facility], [facility] ONLY)    EKG  EKG Interpretation None       Radiology No results found.  Procedures Procedures (including critical care time) CRITICAL CARE Performed by: Delora Fuel Total critical care time: 120 minutes Critical care time was exclusive of separately billable procedures and treating other patients. Critical care was necessary to treat or prevent imminent or life-threatening deterioration. Critical care was time spent personally by me on the following activities: development of treatment plan with patient and/or surrogate as well as nursing, discussions with consultants, evaluation of patient's response to treatment, examination of patient, obtaining history from patient or surrogate, ordering and performing treatments and interventions, ordering and review of laboratory studies, ordering and review of radiographic studies, pulse oximetry and re-evaluation of patient's condition.  Medications Ordered in ED Medications  ondansetron (ZOFRAN) injection 4 mg (not administered)  linezolid (ZYVOX) tablet 600 mg (600 mg Oral Given 03/26/17 0359)  acetaminophen (TYLENOL) tablet 650 mg (not administered)  baclofen (LIORESAL) tablet 20 mg (not administered)  diazepam (VALIUM) tablet 5-10 mg (not administered)  diphenhydrAMINE (BENADRYL) capsule 50 mg (not administered)  fluticasone (FLONASE) 50 MCG/ACT nasal spray 2 spray (not administered)  gabapentin (NEURONTIN) capsule 800 mg (not administered)  lamoTRIgine (LAMICTAL) tablet 125 mg (not administered)  loratadine (CLARITIN) 5 MG/5ML syrup 5 mg (not administered)  lubiprostone (AMITIZA) capsule 24 mcg (not administered)  pantoprazole (PROTONIX) EC tablet 80 mg (not administered)  ondansetron (ZOFRAN-ODT) disintegrating tablet 4 mg (not administered)  sertraline (ZOLOFT) tablet 100 mg (not administered)  0.9 %  sodium  chloride infusion ( Intravenous New Bag/Given 03/26/17 0359)  piperacillin-tazobactam (ZOSYN) IVPB 3.375 g (not administered)  acetaminophen (TYLENOL) tablet 650 mg (not administered)    Or  acetaminophen (TYLENOL) suppository 650 mg (not administered)  ondansetron (ZOFRAN) tablet 4 mg (not administered)    Or  ondansetron (ZOFRAN)  injection 4 mg (not administered)  enoxaparin (LOVENOX) injection 40 mg (not administered)  albumin human 25 % solution 25 g (not administered)  sodium chloride 0.9 % bolus 2,000 mL (0 mLs Intravenous Stopped 03/26/17 0359)  piperacillin-tazobactam (ZOSYN) IVPB 3.375 g (0 g Intravenous Stopped 03/26/17 0412)  sodium chloride 0.9 % bolus 1,000 mL (0 mLs Intravenous Stopped 03/26/17 0757)     Initial Impression / Assessment and Plan / ED Course  I have reviewed the triage vital signs and the nursing notes.  Pertinent labs & imaging results that were available during my care of the patient were reviewed by me and considered in my medical decision making (see chart for details).  Vomiting and diarrhea with hypotension.  This may just be severe dehydration, but certainly there is concern for underlying infection with her severe diarrhea.  Old records are reviewed, and she did have hospitalization last month related to her decubitus ulcer and had been on antibiotics at that time.  She is at risk for Clostridium difficile colitis.  She is started on sepsis pathway.  Initial lactic acid level was elevated at 4.  She is currently in the process of getting IV fluids.  It is not clear at this point if the lactic acid is just related to hypotension from dehydration.  She will be started on empiric antibiotics.  Unfortunately, she is allergic to vancomycin.  Will consult with infectious disease regarding appropriate antibiotic coverage.  3:30 AM Discussed with Dr. Megan Salon of infectious disease who recommends oral linezolid along with IV Zosyn.  If Clostridium difficile assay  is positive, recommends switching to oral vancomycin since it is not absorbed and would not cause anaphylaxis.  Blood pressure did come up with IV fluids and heart rate has come down.  Case is discussed with Dr. Alcario Drought of [facility] hospitalist who agrees to admit the patient.  Of note, repeat lactic acid level has come down, but not to completely normal.  At this point, although patient is on sepsis pathway, I feel that her blood pressure and lactic acid are related to hypotension and hypovolemia and not sepsis.  Final Clinical Impressions(s) / ED Diagnoses   Final diagnoses:  Hypotension due to hypovolemia  Diarrhea of presumed infectious origin  Elevated lactic acid level  Acute kidney injury (nontraumatic) [facility])    ED Discharge Orders    None       Delora Fuel, MD 29/56/21 0900

## 2017-03-26 NOTE — Progress Notes (Signed)
Patient unable to maintain MAP and was started on pressors. IV levophed started. Continue IV fluid NS at 150 cc/hr post 8 L IV fluid boluses. O2 sat 100% RA RR 12-14. Septic shock 2/2 to left ischial tuberosity osteomyelitis vs others. Blood cultures x 2 (03/26/17) in process.  ICU attending made aware of patient. Continue close monitoring, pressor support, and IV antibiotics.

## 2017-03-26 NOTE — ED Notes (Signed)
Lab to add on differential

## 2017-03-26 NOTE — Consult Note (Signed)
[facility] for Infectious Disease    Date of Admission:  03/26/2017   Total days of antibiotics 37        Day 1 piperacillin tazobactam        Day 1 linezolid              Reason for Consult: Diarrhea and sepsis    Referring Provider: Dr. Delora Fuel  Assessment: I was initially most concerned about C. difficile colitis but her C. difficile screen is negative.  Her mother states that her home nurse has said that her ischial wound is getting better but she has gross contamination of the wound with stool that may have led to sepsis.  She could also have a PICC related infection.  If she is better this morning so I will continue her current empiric therapy pending further observation and blood culture results.  Plan: 1. Continue current antibiotics  Principal Problem:   Diarrhea Active Problems:   Decubitus ulcer of left ischial area, stage III (HCC)   Sepsis associated hypotension (HCC)   Osteomyelitis, pelvis (H[facility])   Paraplegia (H[facility])   History of transverse myelitis   Constipation   MDR Acinetobacter baumannii infection   AKI (acute kidney injury) ([place])   Scheduled Meds: . baclofen  20 mg Oral TID  . diazepam  5-10 mg Oral TID  . diphenhydrAMINE  50 mg Oral Q12H  . enoxaparin (LOVENOX) injection  40 mg Subcutaneous Q24H  . gabapentin  800 mg Oral TID  . lamoTRIgine  125 mg Oral Daily  . linezolid  600 mg Oral Q12H  . lubiprostone  24 mcg Oral Q breakfast  . ondansetron  4 mg Oral Q12H  . pantoprazole  80 mg Oral Daily  . sertraline  100 mg Oral TID   Continuous Infusions: . sodium chloride 125 mL/hr at 03/26/17 0929  . piperacillin-tazobactam (ZOSYN)  IV     PRN Meds:.acetaminophen **OR** acetaminophen, acetaminophen, fluticasone, loratadine, ondansetron (ZOFRAN) IV, ondansetron **OR** ondansetron (ZOFRAN) IV  HPI: [name] is a 36 y.o. female with a history of multiple sclerosis, transverse myelitis and paraplegia who was hospitalized last  month with a new left ischial decubitus ulcer and early osteomyelitis of the left ischial tuberosity.  She underwent debridement and wound cultures grew MRSA, Proteus and a multidrug-resistant Acinetobacter.  She was discharged home on 03/11/2017 to continue IV tigecycline and oral levofloxacin.  She has had some nausea related to her tigecycline but otherwise was feeling well until about 2 days ago when she developed severe diarrhea.  Her mother said that liquid stool was coming out "like it was a faucet".  Normally she is constipated medication for constipation.  The diarrhea got so bad that she could not care for her so she called EMS.  When she arrived in the emergency room she was hypothermic with a temperature of 97.4, hypotensive and septic.  When I was called last night I was not aware of her recent hospitalization and multidrug-resistant Acinetobacter.  I recommended a switch to oral linezolid (she has a history of anaphylaxis with vancomycin) and piperacillin tazobactam.  She has been receiving IV fluids and is feeling much better this morning.   Review of Systems: Review of Systems  Constitutional: Positive for malaise/fatigue. Negative for chills, diaphoresis, fever and weight loss.  HENT: Negative for sore throat.   Respiratory: Negative for cough, sputum production and shortness of breath.   Cardiovascular: Negative for chest pain.  Gastrointestinal: Positive for constipation, diarrhea, nausea and vomiting. Negative for abdominal pain and heartburn.  Musculoskeletal: Negative for joint pain and myalgias.  Skin: Negative for rash.  Neurological: Positive for sensory change and focal weakness. Negative for dizziness and headaches.    Past Medical History:  Diagnosis Date  . Bladder stones    Hx of  . Depression   . GERD (gastroesophageal reflux disease)   . Multiple sclerosis ([place])   . Paraplegia ([place])   . PTSD (post-traumatic stress disorder)   . Transverse myelitis ([place])      Social History   Tobacco Use  . Smoking status: Never Smoker  . Smokeless tobacco: Never Used  Substance Use Topics  . Alcohol use: No  . Drug use: No    Family History  Problem Relation Age of Onset  . Diabetes type II Father   . CAD Father   . Dementia Father   . Stroke Father   . Seizures Father   . Diabetes type II Mother   . Hypertension Mother   . Breast cancer Other   . Diabetes type II Other    Allergies  Allergen Reactions  . Iodine Anaphylaxis  . Latex Anaphylaxis  . Polymyxin B Hives    Pt had hives in right arm with infusion of polymyxin on November 26th, 2018  . Shellfish-Derived Products Anaphylaxis    Reaction unknown  . Vancomycin Anaphylaxis  . Aspirin Other (See Comments)    Reaction unknown  . Strawberry Extract Hives and Rash    OBJECTIVE: Blood pressure (!) 98/48, pulse 90, temperature 98.2 F (36.8 C), temperature source Oral, resp. rate (!) 8, weight 135 lb (61.2 kg), SpO2 100 %.  Physical Exam  Constitutional: She is oriented to person, place, and time.  She is drowsy but becomes more alert throughout my exam.  She is in good spirits.  HENT:  Mouth/Throat: No oropharyngeal exudate.  Eyes: Conjunctivae are normal.  Neck: Neck supple.  Cardiovascular: Normal rate and regular rhythm.  No murmur heard. Pulmonary/Chest: Effort normal and breath sounds normal. She has no wheezes. She has no rales.  Abdominal: She exhibits no distension. There is no tenderness.  Neurological: She is alert and oriented to person, place, and time.  Skin: No rash noted.  She has a golf ball size left buttock wound.  There is no exposed bone.  There is no drainage or odor.  The wound is contaminated with liquid brown stool.  Psychiatric: Mood and affect normal.    Lab Results Lab Results  Component Value Date   WBC 44.0 (H) 03/26/2017   HGB 12.8 03/26/2017   HCT 43.2 03/26/2017   MCV 69.5 (L) 03/26/2017   PLT 438 (H) 03/26/2017    Lab Results   Component Value Date   CREATININE 1.37 (H) 03/26/2017   BUN 31 (H) 03/26/2017   NA 137 03/26/2017   K 3.9 03/26/2017   CL 105 03/26/2017   CO2 18 (L) 03/26/2017    Lab Results  Component Value Date   ALT 26 03/26/2017   AST 34 03/26/2017   ALKPHOS 153 (H) 03/26/2017   BILITOT 0.8 03/26/2017     Microbiology: Recent Results (from the past 240 hour(s))  C difficile quick scan w PCR reflex     Status: None   Collection Time: 03/26/17  5:46 AM  Result Value Ref Range Status   C Diff antigen NEGATIVE NEGATIVE Final   C Diff toxin NEGATIVE NEGATIVE Final   C Diff  interpretation No C. difficile detected.  Final    Michel Bickers, MD [facility] for Infectious [place] [facility] [phone] pager   [phone] cell 03/26/2017, 10:45 AM

## 2017-03-26 NOTE — Progress Notes (Signed)
Pharmacy Antibiotic Note  [name] is a 36 y.o. female admitted on 03/26/2017 with sepsis.  Pharmacy has been consulted for zosyn dosing.  Plan: Zosyn 3.375g IV q8h (4 hour infusion).  Zyvox 600 mg po q12h ( ok'd by Dr Megan Salon)     Temp (24hrs), Avg:97.4 F (36.3 C), Min:97.4 F (36.3 C), Max:97.4 F (36.3 C)  Recent Labs  Lab 03/26/17 0223 03/26/17 0237  WBC 44.0*  --   CREATININE 1.37*  --   LATICACIDVEN  --  4.07*    CrCl cannot be calculated (Unknown ideal weight.).    Allergies  Allergen Reactions  . Iodine Anaphylaxis  . Latex Anaphylaxis  . Polymyxin B Hives    Pt had hives in right arm with infusion of polymyxin on November 26th, 2018  . Shellfish-Derived Products Anaphylaxis    Reaction unknown  . Vancomycin Anaphylaxis  . Aspirin Other (See Comments)    Reaction unknown  . Strawberry Extract Hives and Rash    Antimicrobials this admission: 12/25 zosyn >>  12/25 zyvox  >>   Dose adjustments this admission:   Microbiology results:  BCx:   UCx:    Sputum:    MRSA PCR:   Thank you for allowing pharmacy to be a part of this patient's care.  [name] 03/26/2017 4:04 AM

## 2017-03-26 NOTE — ED Notes (Signed)
ED TO INPATIENT HANDOFF REPORT  Name/Age/Gender [name] 36 y.o. female  Code Status    Code Status Orders  (From admission, onward)        Start     Ordered   03/26/17 0451  Full code  Continuous     03/26/17 0452    Code Status History    Date Active Date Inactive Code Status Order ID Comments User Context   02/14/2017 01:00 03/12/2017 17:36 Full Code [id number]  Phillips Grout, MD Inpatient   11/15/2016 02:45 11/17/2016 18:48 Full Code [id number]  [name], MD Inpatient   07/17/2016 23:21 07/19/2016 20:47 Full Code [id number]  Ivor Costa, MD ED   02/15/2016 22:17 02/25/2016 20:01 Full Code [id number]  [name], MD Inpatient   01/07/2016 09:58 01/09/2016 14:24 Full Code [id number]  [name], MD Inpatient   10/29/2014 21:14 10/31/2014 17:11 Full Code [id number]  [name], MD Inpatient   09/27/2014 22:49 09/28/2014 20:46 Full Code [id number]  [name], DO ED   03/30/2013 12:15 04/02/2013 17:32 Full Code [id number]  [name], MD Inpatient   02/16/2012 01:46 02/18/2012 18:53 Full Code [id number]  [name]., DO ED      Home/SNF/Other Home?  Chief Complaint Diarrhea  Level of Care/Admitting Diagnosis ED Disposition    ED Disposition Condition [facility] Area: [place] [[id number]]  Level of Care: Stepdown [14]  Admit to SDU based on following criteria: Hemodynamic compromise or significant risk of instability:  Patient requiring short term acute titration and management of vasoactive drips, and invasive monitoring (i.e., CVP and Arterial line).  Diagnosis: Severe sepsis [facility]) [[id number]]  Admitting Physician: Doreatha Massed  Attending Physician: [name] [phone]  Estimated length of stay: past midnight tomorrow  Certification:: I certify this patient will need inpatient services for at least 2 midnights  PT Class (Do Not Modify): Inpatient [101]  PT Acc Code (Do Not Modify):  Private [1]       Medical History Past Medical History:  Diagnosis Date  . Bladder stones    Hx of  . Depression   . GERD (gastroesophageal reflux disease)   . Multiple sclerosis ([place])   . Paraplegia ([place])   . PTSD (post-traumatic stress disorder)   . Transverse myelitis ([facility])     Allergies Allergies  Allergen Reactions  . Iodine Anaphylaxis  . Latex Anaphylaxis  . Polymyxin B Hives    Pt had hives in right arm with infusion of polymyxin on November 26th, 2018  . Shellfish-Derived Products Anaphylaxis    Reaction unknown  . Vancomycin Anaphylaxis  . Aspirin Other (See Comments)    Reaction unknown  . Strawberry Extract Hives and Rash    IV Location/Drains/Wounds Patient Lines/Drains/Airways Status   Active Line/Drains/Airways    Name:   Placement date:   Placement time:   Site:   Days:   PICC Double Lumen 02/25/17 PICC Left Brachial 34 cm   02/25/17    0916     29   Urostomy Ureterostomy right RLQ   -    -    RLQ      Pressure Injury 03/03/17 Stage I -  Intact skin with non-blanchable redness of a localized area usually over a bony prominence. small, red non-blanchable area   03/03/17    1130     23   Wound / Incision (Open or Dehisced) 02/14/17 Other (Comment) Buttocks Left;Anterior;Mid Non stageable wound with  black area 10 x 4 cm in size   02/14/17    0330    Buttocks   40          Labs/Imaging Results for orders placed or performed during the hospital encounter of 03/26/17 (from the past 48 hour(s))  Lipase, blood     Status: None   Collection Time: 03/26/17  2:23 AM  Result Value Ref Range   Lipase 23 11 - 51 U/L  Comprehensive metabolic panel     Status: Abnormal   Collection Time: 03/26/17  2:23 AM  Result Value Ref Range   Sodium 137 135 - 145 mmol/L   Potassium 3.9 3.5 - 5.1 mmol/L   Chloride 105 101 - 111 mmol/L   CO2 18 (L) 22 - 32 mmol/L   Glucose, Bld 154 (H) 65 - 99 mg/dL   BUN 31 (H) 6 - 20 mg/dL   Creatinine, Ser 1.37 (H) 0.44 - 1.00 mg/dL    Calcium 7.7 (L) 8.9 - 10.3 mg/dL   Total Protein 4.5 (L) 6.5 - 8.1 g/dL   Albumin 2.3 (L) 3.5 - 5.0 g/dL   AST 34 15 - 41 U/L   ALT 26 14 - 54 U/L   Alkaline Phosphatase 153 (H) 38 - 126 U/L   Total Bilirubin 0.8 0.3 - 1.2 mg/dL   GFR calc non Af Amer 49 (L) >60 mL/min   GFR calc Af Amer 57 (L) >60 mL/min    Comment: (NOTE) The eGFR has been calculated using the CKD EPI equation. This calculation has not been validated in all clinical situations. eGFR's persistently <60 mL/min signify possible Chronic Kidney Disease.    Anion gap 14 5 - 15  CBC     Status: Abnormal   Collection Time: 03/26/17  2:23 AM  Result Value Ref Range   WBC 44.0 (H) 4.0 - 10.5 K/uL   RBC 6.22 (H) 3.87 - 5.11 MIL/uL   Hemoglobin 12.8 12.0 - 15.0 g/dL   HCT 43.2 36.0 - 46.0 %   MCV 69.5 (L) 78.0 - 100.0 fL   MCH 20.6 (L) 26.0 - 34.0 pg   MCHC 29.6 (L) 30.0 - 36.0 g/dL   RDW 21.1 (H) 11.5 - 15.5 %   Platelets 438 (H) 150 - 400 K/uL  Urinalysis, Routine w reflex microscopic     Status: Abnormal   Collection Time: 03/26/17  2:23 AM  Result Value Ref Range   Color, Urine YELLOW YELLOW   APPearance CLOUDY (A) CLEAR   Specific Gravity, Urine 1.014 1.005 - 1.030   pH 6.0 5.0 - 8.0   Glucose, UA NEGATIVE NEGATIVE mg/dL   Hgb urine dipstick SMALL (A) NEGATIVE   Bilirubin Urine NEGATIVE NEGATIVE   Ketones, ur NEGATIVE NEGATIVE mg/dL   Protein, ur 30 (A) NEGATIVE mg/dL   Nitrite NEGATIVE NEGATIVE   Leukocytes, UA TRACE (A) NEGATIVE   RBC / HPF 6-30 0 - 5 RBC/hpf   WBC, UA TOO NUMEROUS TO COUNT 0 - 5 WBC/hpf   Bacteria, UA RARE (A) NONE SEEN   Squamous Epithelial / LPF 0-5 (A) NONE SEEN   Mucus PRESENT    Budding Yeast PRESENT   Pregnancy, urine     Status: None   Collection Time: 03/26/17  2:23 AM  Result Value Ref Range   Preg Test, Ur NEGATIVE NEGATIVE    Comment:        THE SENSITIVITY OF THIS METHODOLOGY IS >20 mIU/mL.   Differential     Status:  Abnormal   Collection Time: 03/26/17  2:23 AM   Result Value Ref Range   Neutrophils Relative % 90 %   Lymphocytes Relative 5 %   Monocytes Relative 5 %   Eosinophils Relative 0 %   Basophils Relative 0 %   Neutro Abs 39.6 (H) 1.7 - 7.7 K/uL   Lymphs Abs 2.2 0.7 - 4.0 K/uL   Monocytes Absolute 2.2 (H) 0.1 - 1.0 K/uL   Eosinophils Absolute 0.0 0.0 - 0.7 K/uL   Basophils Absolute 0.0 0.0 - 0.1 K/uL   RBC Morphology POLYCHROMASIA PRESENT   I-Stat beta hCG blood, ED     Status: None   Collection Time: 03/26/17  2:35 AM  Result Value Ref Range   I-stat hCG, quantitative <5.0 <5 mIU/mL   Comment 3            Comment:   GEST. AGE      CONC.  (mIU/mL)   <=1 WEEK        5 - 50     2 WEEKS       50 - 500     3 WEEKS       100 - 10,000     4 WEEKS     1,000 - 30,000        FEMALE AND NON-PREGNANT FEMALE:     LESS THAN 5 mIU/mL   I-Stat CG4 Lactic Acid, ED  (not at  [place])     Status: Abnormal   Collection Time: 03/26/17  2:37 AM  Result Value Ref Range   Lactic Acid, Venous 4.07 (HH) 0.5 - 1.9 mmol/L   Comment NOTIFIED PHYSICIAN   I-Stat CG4 Lactic Acid, ED  (not at  [facility])     Status: Abnormal   Collection Time: 03/26/17  5:13 AM  Result Value Ref Range   Lactic Acid, Venous 2.50 (HH) 0.5 - 1.9 mmol/L   Comment NOTIFIED PHYSICIAN   C difficile quick scan w PCR reflex     Status: None   Collection Time: 03/26/17  5:46 AM  Result Value Ref Range   C Diff antigen NEGATIVE NEGATIVE   C Diff toxin NEGATIVE NEGATIVE   C Diff interpretation No C. difficile detected.   Lactic acid, plasma     Status: Abnormal   Collection Time: 03/26/17  5:46 AM  Result Value Ref Range   Lactic Acid, Venous 2.3 (HH) 0.5 - 1.9 mmol/L    Comment: CRITICAL RESULT CALLED TO, READ BACK BY AND VERIFIED WITH: TALKINGTON,J @ 0652 ON [id number] BY POTEAT,S   Lactic acid, plasma     Status: None   Collection Time: 03/26/17  8:01 AM  Result Value Ref Range   Lactic Acid, Venous 1.3 0.5 - 1.9 mmol/L   Dg Chest Port 1 View  Result Date: 03/26/2017 CLINICAL  DATA:  Acute onset of hypotension. EXAM: PORTABLE CHEST 1 VIEW COMPARISON:  Chest radiograph performed 03/07/2017 FINDINGS: The lungs are well-aerated and clear. There is no evidence of focal opacification, pleural effusion or pneumothorax. The cardiomediastinal silhouette is within normal limits. No acute osseous abnormalities are seen. A left PICC is noted ending about the distal SVC. IMPRESSION: No acute cardiopulmonary process seen. Electronically Signed   By: Garald Balding M.D.   On: 03/26/2017 04:04    Pending Labs Unresulted Labs (From admission, onward)   Start     Ordered   03/27/17 0500  CBC  Tomorrow morning,   R     03/26/17 7579  03/27/17 0500  Basic metabolic panel  Tomorrow morning,   R     03/26/17 0523   03/26/17 0451  HIV antibody (Routine Testing)  Once,   R     03/26/17 0452   03/26/17 0222  Gastrointestinal Panel by PCR , Stool  (Gastrointestinal Panel by PCR, Stool)  Once,   R     03/26/17 0221   03/26/17 0221  Blood Culture (routine x 2)  BLOOD CULTURE X 2,   STAT     03/26/17 0221   03/26/17 0221  Urine culture  STAT,   STAT     03/26/17 0221      Vitals/Pain Today's Vitals   03/26/17 1230 03/26/17 1315 03/26/17 1328 03/26/17 1345  BP: (!) 85/44 (!) 84/41  (!) 82/40  Pulse: 83 84  88  Resp:  15  11  Temp:   (!) 96.6 F (35.9 C)   TempSrc:   Rectal   SpO2: 100% 100%  100%  Weight:      PainSc:        Isolation Precautions Enteric precautions (UV disinfection)  Medications Medications  ondansetron (ZOFRAN) injection 4 mg (not administered)  linezolid (ZYVOX) tablet 600 mg (600 mg Oral Given 03/26/17 1102)  acetaminophen (TYLENOL) tablet 650 mg (not administered)  baclofen (LIORESAL) tablet 20 mg (20 mg Oral Given 03/26/17 1103)  diphenhydrAMINE (BENADRYL) capsule 50 mg (50 mg Oral Given 03/26/17 1103)  fluticasone (FLONASE) 50 MCG/ACT nasal spray 2 spray (not administered)  gabapentin (NEURONTIN) capsule 800 mg (800 mg Oral Given 03/26/17 1258)   lamoTRIgine (LAMICTAL) tablet 125 mg (125 mg Oral Given 03/26/17 1257)  loratadine (CLARITIN) tablet 5 mg (not administered)  lubiprostone (AMITIZA) capsule 24 mcg (24 mcg Oral Given 03/26/17 1310)  pantoprazole (PROTONIX) EC tablet 80 mg (80 mg Oral Given 03/26/17 1257)  ondansetron (ZOFRAN-ODT) disintegrating tablet 4 mg (4 mg Oral Given 03/26/17 1258)  sertraline (ZOLOFT) tablet 100 mg (100 mg Oral Given 03/26/17 1258)  0.9 %  sodium chloride infusion ( Intravenous Transfusing/Transfer 03/26/17 1351)  piperacillin-tazobactam (ZOSYN) IVPB 3.375 g (3.375 g Intravenous Transfusing/Transfer 03/26/17 1351)  acetaminophen (TYLENOL) tablet 650 mg (not administered)    Or  acetaminophen (TYLENOL) suppository 650 mg (not administered)  ondansetron (ZOFRAN) tablet 4 mg (not administered)    Or  ondansetron (ZOFRAN) injection 4 mg (not administered)  enoxaparin (LOVENOX) injection 40 mg (40 mg Subcutaneous Not Given 03/26/17 0944)  diazepam (VALIUM) tablet 5 mg (not administered)    And  diazepam (VALIUM) tablet 10 mg (not administered)  sodium chloride 0.9 % bolus 2,000 mL (0 mLs Intravenous Stopped 03/26/17 0359)  piperacillin-tazobactam (ZOSYN) IVPB 3.375 g (0 g Intravenous Stopped 03/26/17 0412)  sodium chloride 0.9 % bolus 1,000 mL (0 mLs Intravenous Stopped 03/26/17 0757)  albumin human 25 % solution 25 g (0 g Intravenous Stopped 03/26/17 0950)  sodium chloride 0.9 % bolus 1,000 mL (1,000 mLs Intravenous Transfusing/Transfer 03/26/17 1351)  albumin human 25 % solution 25 g (0 g Intravenous Stopped 03/26/17 1346)    Mobility bedbound  

## 2017-03-26 NOTE — ED Triage Notes (Signed)
Pt BIB EMS from home. Patient currently staying in poor living conditions, unclean environment. Patient began having vomiting early Monday evening. Pt took zofran which helped the nausea, but now patient is having constant diarrhea. Per mother, there may be red streaks.

## 2017-03-26 NOTE — Progress Notes (Addendum)
Upon arrival to unit pt hypotensive but able to answer orientation questions. Pt quickly declined and was no longer able to answer questions but would make eye contact when spoken to. Pt was getting 6th liter of NS bolus upon arrival to 1229 per ED RN. Hospitalist at bedside ordered 2 more NS L bolus and neo gtt. Neo gtt started at 200 mcg at 1504. Titrated down to 180 mcg at 1511. Titrated down to 150 mcg at 1515. Titrated down to 100 mcg at 1520. CCM at bedside. Neo gtt stopped and pt placed on levo gtt.

## 2017-03-26 NOTE — Progress Notes (Signed)
[name] is a 36 y.o. female with medical history significant of paraplegia, MS, transverse myelitis, recently admitted last month for sacral decubitus with osteomyelitis. Discharged on 12/10 with a PICC line on tigacycline with PO levaquin.   Presents this time will complaints of diarrhea and vomiting of 1 day duration. Upon presentation wbc 44 k and hypotensive. IV fluid boluses administered. Maintaining MAP above 60. On maintainance IV fluid NS 125 cc/hr. Continue close monitoring.  C-diff pcr negative 03/26/17. ID following and recommends to continue antibiotics.

## 2017-03-26 NOTE — H&P (Signed)
History and Physical    [name] GMW:[id number] DOB: Jan 28, 1981 DOA: 03/26/2017  PCP: Lennie Odor, PA-C  Patient coming from: Home  I have personally briefly reviewed patient's old medical records in [place]  Chief Complaint: Diarrhea  HPI: [name] is a 36 y.o. female with medical history significant of paraplegia, MS, transverse myelitis, patient was recently admitted last month until some 15 days ago for sacral decubitus with osteomyelitis.  Cultures grew a pan-sensitive proteus, MRSA, on 2 cultures each and on one culture an Acetinobacter baumanii Complex that was resistant to everything unfortunately other than being intermediate to levaquin.  Patient was ultimately discharged on 12/10 with a PICC line and on tigacycline with PO levaquin.  Earlier this evening patient developed onset of vomiting and diarrhea.  Vomiting improved with zofran, but diarrhea has persisted and is constant, severe.  Patient brought in to the ED.  ED Course: In the ED patient is frankly septic, with initial BPs of 44/03K systolic, tachycardia, WBC 44k!!! Up rather significantly from 4k just 10 days ago, UA shows trace LE, many WBCs.  C.Diff quick-scan is pending.  Patient has gotten 2L NS bolus with improvement initially in BP though trending back down somewhat again.   Review of Systems: As per HPI otherwise 10 point review of systems negative.   Past Medical History:  Diagnosis Date  . Bladder stones    Hx of  . Depression   . GERD (gastroesophageal reflux disease)   . Multiple sclerosis ([place])   . Paraplegia ([place])   . PTSD (post-traumatic stress disorder)   . Transverse myelitis [facility])     Past Surgical History:  Procedure Laterality Date  . CHOLECYSTECTOMY    . CYSTECTOMY W/ URETEROILEAL CONDUIT    . IR FLUORO GUIDE CV LINE LEFT  02/25/2017  . IR US GUIDE VASC ACCESS LEFT  02/25/2017  . IRRIGATION AND DEBRIDEMENT ABSCESS N/A 02/18/2017   Procedure: IRRIGATION AND  DEBRIDEMENT ABSCESS;  Surgeon: Excell Seltzer, MD;  Location: [facility] OR[facility];  Service: General;  Laterality: N/A;  . TONSILLECTOMY    . WISDOM TOOTH EXTRACTION       reports that  has never smoked. she has never used smokeless tobacco. She reports that she does not drink alcohol or use drugs.  Allergies  Allergen Reactions  . Iodine Anaphylaxis  . Latex Anaphylaxis  . Polymyxin B Hives    Pt had hives in right arm with infusion of polymyxin on November 26th, 2018  . Shellfish-Derived Products Anaphylaxis    Reaction unknown  . Vancomycin Anaphylaxis  . Aspirin Other (See Comments)    Reaction unknown  . Strawberry Extract Hives and Rash    Family History  Problem Relation Age of Onset  . Diabetes type II Father   . CAD Father   . Dementia Father   . Stroke Father   . Seizures Father   . Diabetes type II Mother   . Hypertension Mother   . Breast cancer Other   . Diabetes type II Other      Prior to Admission medications   Medication Sig Start Date End Date Taking? Authorizing Provider  acetaminophen (TYLENOL) 325 MG tablet Take 650 mg by mouth every 6 (six) hours as needed for fever.   Yes [provider]  baclofen (LIORESAL) 20 MG tablet Take 20 mg by mouth 3 (three) times daily.   Yes [provider]  bisacodyl (DULCOLAX) 10 MG suppository Place 1 suppository (10 mg total)  daily rectally. Patient taking differently: Place 10 mg rectally daily as needed for mild constipation.  02/15/17  Yes Florencia Reasons, MD  diazepam (VALIUM) 5 MG tablet Take 1-2 tablets (5-10 mg total) by mouth every 8 (eight) hours. Take 1 tablet in AM, 1 tablet in PM, and 2 tablets at bedtime Patient taking differently: Take 5-10 mg by mouth 3 (three) times daily. Take 1 tablet in AM, 1 tablet in PM, and 2 tablets at bedtime 04/02/13  Yes Robbie Lis, MD  diphenhydrAMINE (BENADRYL) 25 mg capsule Take 1 capsule (25 mg total) by mouth every 8 (eight) hours as needed for itching. Patient taking  differently: Take 50 mg by mouth every 12 (twelve) hours.  03/12/17  Yes Rosita Fire, MD  fluticasone [facility]) 50 MCG/ACT nasal spray Place 2 sprays into the nose daily as needed for allergies.    Yes [provider]  gabapentin (NEURONTIN) 400 MG capsule Take 2 capsules (800 mg total) by mouth 3 (three) times daily. 08/13/16  Yes Jaffe, Adam R, DO  ibuprofen (ADVIL,MOTRIN) 200 MG tablet Take 200-400 mg by mouth every 6 (six) hours as needed for headache or moderate pain.    Yes [provider]  lamoTRIgine (LAMICTAL) 25 MG tablet Take 125 mg by mouth daily.   Yes [provider]  levofloxacin (LEVAQUIN) 750 MG tablet Take 1 tablet (750 mg total) by mouth daily for 28 days. Patient taking differently: Take 750 mg by mouth daily. Started 12/13 03/12/17 04/09/17 Yes Rosita Fire, MD  loratadine (CLARITIN) 5 MG/5ML syrup Take 5 mg daily as needed by mouth for allergies.    Yes [provider]  lubiprostone (AMITIZA) 24 MCG capsule Take 24 mcg daily with breakfast by mouth.    Yes [provider]  omeprazole (PRILOSEC) 40 MG capsule Take 40 mg by mouth daily.   Yes [provider]  ondansetron (ZOFRAN-ODT) 4 MG disintegrating tablet Take 1 tablet (4 mg total) by mouth every 8 (eight) hours as needed for nausea or vomiting. Patient taking differently: Take 4 mg by mouth every 12 (twelve) hours.  03/12/17  Yes Rosita Fire, MD  senna-docusate (SENOKOT-S) 8.6-50 MG tablet Take 1 tablet at bedtime by mouth. Patient taking differently: Take 1 tablet by mouth daily as needed for moderate constipation.  02/15/17  Yes Florencia Reasons, MD  sertraline (ZOLOFT) 100 MG tablet Take 100 mg by mouth 3 (three) times daily.    Yes [provider]  tigecycline 50 mg in sodium chloride 0.9 % 100 mL Inject 50 mg into the vein every 12 (twelve) hours. Indication:  Pelvic osteomyelitis Last Day of Therapy:  04/08/17 Labs - Once weekly:  CBC/D and  BMP, Labs - Every other week:  ESR and CRP Patient taking differently: Inject 50 mg into the vein every 12 (twelve) hours. Indication:  Pelvic osteomyelitis Last Day of Therapy:  04/08/17 Labs - Once weekly:  CBC/D and BMP, Labs - Every other week:  ESR and CRP Started 12/13 03/12/17  Yes Rosita Fire, MD    Physical Exam: Vitals:   03/26/17 0400 03/26/17 0415 03/26/17 0430 03/26/17 0445  BP: (!) 97/48 (!) 98/46 (!) 90/49 (!) 88/52  Pulse: (!) 104 (!) 102 100 99  Resp: _0 Temp:      TempSrc:      SpO2: 100% 99% 99% 99%    Constitutional: NAD, calm, comfortable Eyes: PERRL, lids and conjunctivae normal ENMT: Mucous membranes are moist.  Posterior pharynx clear of any exudate or lesions.Normal dentition.  Neck: normal, supple, no masses, no thyromegaly Respiratory: clear to auscultation bilaterally, no wheezing, no crackles. Normal respiratory effort. No accessory muscle use.  Cardiovascular: Regular rate and rhythm, no murmurs / rubs / gallops. No extremity edema. 2+ pedal pulses. No carotid bruits.  Abdomen: Urostomy in place RLQ Musculoskeletal: no clubbing / cyanosis. No joint deformity upper and lower extremities. Good ROM, no contractures. Normal muscle tone.  Skin: L ischial decubitus ulcer, some stool contamination. Neurologic: Paraplegic Psychiatric: Normal judgment and insight. Alert and oriented x 3. Normal mood.    Labs on Admission: I have personally reviewed following labs and imaging studies  CBC: Recent Labs  Lab 03/26/17 0223  WBC 44.0*  NEUTROABS 39.6*  HGB 12.8  HCT 43.2  MCV 69.5*  PLT 179*   Basic Metabolic Panel: Recent Labs  Lab 03/26/17 0223  NA 137  K 3.9  CL 105  CO2 18*  GLUCOSE 154*  BUN 31*  CREATININE 1.37*  CALCIUM 7.7*   GFR: CrCl cannot be calculated (Unknown ideal weight.). Liver Function Tests: Recent Labs  Lab 03/26/17 0223  AST 34  ALT 26  ALKPHOS 153*  BILITOT 0.8  PROT 4.5*  ALBUMIN 2.3*    Recent Labs  Lab 03/26/17 0223  LIPASE 23   No results for input(s): AMMONIA in the last 168 hours. Coagulation Profile: No results for input(s): INR, PROTIME in the last 168 hours. Cardiac Enzymes: No results for input(s): CKTOTAL, CKMB, CKMBINDEX, TROPONINI in the last 168 hours. BNP (last 3 results) No results for input(s): PROBNP in the last 8760 hours. HbA1C: No results for input(s): HGBA1C in the last 72 hours. CBG: No results for input(s): GLUCAP in the last 168 hours. Lipid Profile: No results for input(s): CHOL, HDL, LDLCALC, TRIG, CHOLHDL, LDLDIRECT in the last 72 hours. Thyroid Function Tests: No results for input(s): TSH, T4TOTAL, FREET4, T3FREE, THYROIDAB in the last 72 hours. Anemia Panel: No results for input(s): VITAMINB12, FOLATE, FERRITIN, TIBC, IRON, RETICCTPCT in the last 72 hours. Urine analysis:    Component Value Date/Time   COLORURINE YELLOW 03/26/2017 0223   APPEARANCEUR CLOUDY (A) 03/26/2017 0223   LABSPEC 1.014 03/26/2017 0223   PHURINE 6.0 03/26/2017 0223   GLUCOSEU NEGATIVE 03/26/2017 0223   HGBUR SMALL (A) 03/26/2017 0223   BILIRUBINUR NEGATIVE 03/26/2017 0223   KETONESUR NEGATIVE 03/26/2017 0223   PROTEINUR 30 (A) 03/26/2017 0223   UROBILINOGEN 2.0 (H) 10/29/2014 1627   NITRITE NEGATIVE 03/26/2017 0223   LEUKOCYTESUR TRACE (A) 03/26/2017 0223    Radiological Exams on Admission: Dg Chest Port 1 View  Result Date: 03/26/2017 CLINICAL DATA:  Acute onset of hypotension. EXAM: PORTABLE CHEST 1 VIEW COMPARISON:  Chest radiograph performed 03/07/2017 FINDINGS: The lungs are well-aerated and clear. There is no evidence of focal opacification, pleural effusion or pneumothorax. The cardiomediastinal silhouette is within normal limits. No acute osseous abnormalities are seen. A left PICC is noted ending about the distal SVC. IMPRESSION: No acute cardiopulmonary process seen. Electronically Signed   By: Garald Balding M.D.   On: 03/26/2017 04:04     EKG: Independently reviewed.  Assessment/Plan Principal Problem:   Sepsis associated hypotension (HCC) Active Problems:   Paraplegia (HCC)   History of transverse myelitis   Osteomyelitis, pelvis (HCC)   Diarrhea   Severe sepsis (HCC)   MDR Acinetobacter baumannii infection   AKI (acute kidney injury) ([place])    1. Severe sepsis, hypotension, with AKI - 1.  concerning picture for possible C.Diff colitis in setting of extensive ongoing ABx use 2. Levaquin well known to be a potential causing agent for C.Diff 3. Although tigecycline is not so well described as a cause for C.Diff and actually has some studies / data suggesting that it is active against C.Diff in-vitro 4. EDP spoke with Dr. Megan Salon: 1. Empiric zosyn / LZD for now 2. If / when C.Diff comes back positive, switch ABx to IV flagyl and PO vanc 3. Per Dr. Roxanne Mins, Dr. Megan Salon states that PO vanc shouldn't be absorbed systemically and can be given despite history of allergy to vancomycin IV. 5. IVF: 2L NS bolus in ED, giving another 500cc bolus now due to declining BPs again, rate at 125 cc/hr after boluses. 1. Plan to continue PRN boluses up until 4L total IVF given, after which, if BP trends back down, would consult PCCM regarding need for possible pressors, but does seem to be fluid responsive for the moment. 6. UCx, BCx 2. Paraplegia - chronic and baseline 3. Osteomyelitis of pelvis - 1. currently switching to zosyn / vanc as above 2. ID consult as above 4. MDR ABC infection - 1. ID consult as above 2. Contact precautions 5. AKI - in setting of sepsis and hypotension 1. Monitor UOP 2. Repeat BMP tomorrow AM  DVT prophylaxis: Lovenox Code Status: Full Family Communication: No family in room Disposition Plan: Home after admit Consults called: Dr. Megan Salon with ID, EDP spoke with him as above Admission status: Admit to inpatient - inpatient status for severe sepsis, hypotension, AKI, and life threatening  illness.   [name] DO [facility] Pager [phone]  If 7AM-7PM, please contact day team taking care of patient [email or web address] Password [facility]  03/26/2017, 4:58 AM

## 2017-03-26 NOTE — ED Notes (Signed)
MD at bedside. 

## 2017-03-27 DIAGNOSIS — E869 Volume depletion, unspecified: Secondary | ICD-10-CM

## 2017-03-27 DIAGNOSIS — R7989 Other specified abnormal findings of blood chemistry: Secondary | ICD-10-CM

## 2017-03-27 DIAGNOSIS — K5900 Constipation, unspecified: Secondary | ICD-10-CM

## 2017-03-27 DIAGNOSIS — E2740 Unspecified adrenocortical insufficiency: Secondary | ICD-10-CM

## 2017-03-27 LAB — BASIC METABOLIC PANEL
ANION GAP: 6 (ref 5–15)
ANION GAP: 7 (ref 5–15)
BUN: 8 mg/dL (ref 6–20)
BUN: 6 mg/dL (ref 6–20)
CHLORIDE: 111 mmol/L (ref 101–111)
CO2: 26 mmol/L (ref 22–32)
CO2: 23 mmol/L (ref 22–32)
Calcium: 7.2 mg/dL — ABNORMAL LOW (ref 8.9–10.3)
Calcium: 7.5 mg/dL — ABNORMAL LOW (ref 8.9–10.3)
Chloride: 112 mmol/L — ABNORMAL HIGH (ref 101–111)
Creatinine, Ser: <0.3 mg/dL — ABNORMAL LOW (ref 0.44–1.00)
Creatinine, Ser: 0.38 mg/dL — ABNORMAL LOW (ref 0.44–1.00)
GFR calc non Af Amer: >60 mL/min (ref >60)
Glucose, Bld: 167 mg/dL — ABNORMAL HIGH (ref 65–99)
Glucose, Bld: 236 mg/dL — ABNORMAL HIGH (ref 65–99)
POTASSIUM: 2.1 mmol/L — AB (ref 3.5–5.1)
SODIUM: 141 mmol/L (ref 135–145)
Sodium: 144 mmol/L (ref 135–145)

## 2017-03-27 LAB — URINE CULTURE: Culture: >=100000 — AB

## 2017-03-27 LAB — GASTROINTESTINAL PANEL BY PCR, STOOL (REPLACES STOOL CULTURE)

## 2017-03-27 LAB — CBC
HEMATOCRIT: 27.6 % — AB (ref 36.0–46.0)
Hemoglobin: 8.3 g/dL — ABNORMAL LOW (ref 12.0–15.0)
MCH: 20.8 pg — AB (ref 26.0–34.0)
MCHC: 30.1 g/dL (ref 30.0–36.0)
MCV: 69.2 fL — AB (ref 78.0–100.0)
Platelets: 173 10*3/uL (ref 150–400)
RBC: 3.99 MIL/uL (ref 3.87–5.11)
RDW: 21 % — AB (ref 11.5–15.5)
WBC: 13.6 10*3/uL — AB (ref 4.0–10.5)

## 2017-03-27 LAB — GLUCOSE, CAPILLARY: GLUCOSE-CAPILLARY: 164 mg/dL — AB (ref 65–99)

## 2017-03-27 LAB — PHOSPHORUS: PHOSPHORUS: 1 mg/dL — AB (ref 2.5–4.6)

## 2017-03-27 LAB — MAGNESIUM
MAGNESIUM: 1.9 mg/dL (ref 1.7–2.4)
Magnesium: 1.4 mg/dL — ABNORMAL LOW (ref 1.7–2.4)

## 2017-03-27 MED ORDER — POTASSIUM PHOSPHATES 15 MMOLE/5ML IV SOLN
40.0000 meq | Freq: Once | INTRAVENOUS | Status: AC
  Administered 2017-03-27: 40 meq via INTRAVENOUS
  Filled 2017-03-27: qty 9.09

## 2017-03-27 MED ORDER — SODIUM CHLORIDE 0.9 % IV SOLN
INTRAVENOUS | Status: DC
  Administered 2017-03-27 – 2017-03-29 (×2): via INTRAVENOUS

## 2017-03-27 MED ORDER — POTASSIUM CHLORIDE 10 MEQ/50ML IV SOLN
10.0000 meq | INTRAVENOUS | Status: AC
  Administered 2017-03-27 (×6): 10 meq via INTRAVENOUS
  Filled 2017-03-27 (×6): qty 50

## 2017-03-27 MED ORDER — POTASSIUM CHLORIDE CRYS ER 20 MEQ PO TBCR
40.0000 meq | EXTENDED_RELEASE_TABLET | Freq: Three times a day (TID) | ORAL | Status: AC
  Administered 2017-03-27 – 2017-03-28 (×3): 40 meq via ORAL
  Filled 2017-03-27 (×3): qty 2

## 2017-03-27 MED ORDER — MAGNESIUM SULFATE IN D5W 1-5 GM/100ML-% IV SOLN
1.0000 g | Freq: Once | INTRAVENOUS | Status: AC
  Administered 2017-03-27: 1 g via INTRAVENOUS
  Filled 2017-03-27: qty 100

## 2017-03-27 MED ORDER — POTASSIUM PHOSPHATES 15 MMOLE/5ML IV SOLN
30.0000 mmol | Freq: Once | INTRAVENOUS | Status: AC
  Administered 2017-03-28: 30 mmol via INTRAVENOUS
  Filled 2017-03-27: qty 10

## 2017-03-27 NOTE — Progress Notes (Signed)
[place] Progress Note Patient Name: [name] DOB: 1981-03-18 MRN: [id number]   Date of Service  03/27/2017  HPI/Events of Note  Low K, phos  eICU Interventions  Repleted     Intervention Category Major Interventions: Electrolyte abnormality - evaluation and management  Anjoli Diemer 03/27/2017, 11:43 PM

## 2017-03-27 NOTE — Care Management Note (Signed)
Case Management Note  Patient Details  Name: [name] MRN: [id number] Date of Birth: 10/09/1980  Subjective/Objective:                  Hypotensive state  Action/Plan: Date: March 27, 2017 Velva Harman, BSN, [place], [place] Chart and notes review for patient progress and needs. Will follow for case management and discharge needs. Next review date: [id number]  Expected Discharge Date:                  Expected Discharge Plan:     In-House Referral:     Discharge planning Services  CM Consult  Post Acute Care Choice:  Resumption of Svcs/PTA Provider Choice offered to:     DME Arranged:    DME Agency:     HH Arranged:    [place] Agency:     Status of Service:  In process, will continue to follow  If discussed at Long Length of Stay Meetings, dates discussed:    Additional Comments:  Leeroy Cha, RN 03/27/2017, 8:34 AM

## 2017-03-27 NOTE — Progress Notes (Signed)
Patient ID: [name], female   DOB: 1980-06-24, 36 y.o.   MRN: [id number]          [place] for Infectious Disease  Date of Admission:  03/26/2017   Total days of antibiotics 38        Day 2 linezolid        Day 2 piperacillin tazobactam         ASSESSMENT: The cause of her acute sepsis and diarrhea remains clear but I suspect that her sepsis may have been related to fecal contamination of her left ischial decubitus.  She has yeast in her urine but I doubt that her acute illness is due to a fungal would not start antifungal therapy now.  It sounds like she was having difficulty tolerating tigecycline due to GI side effects.  That has improved since stopping tigecycline.  She is still hypotensive but otherwise improved.  PLAN: 1. Continue current antibiotics pending further observation and final blood culture results  Principal Problem:   Diarrhea Active Problems:   Decubitus ulcer of left ischial area, stage III (HCC)   Sepsis associated hypotension (HCC)   Osteomyelitis, pelvis (HCC)   Paraplegia (HCC)   History of transverse myelitis   Constipation   MDR Acinetobacter baumannii infection   AKI (acute kidney injury) (HCC)   Somnolence   Facial droop   Scheduled Meds: . chlorhexidine  15 mL Mouth Rinse BID  . enoxaparin (LOVENOX) injection  40 mg Subcutaneous QHS  . hydrocortisone sod succinate (SOLU-CORTEF) inj  50 mg Intravenous Q6H  . linezolid  600 mg Oral Q12H  . mouth rinse  15 mL Mouth Rinse q12n4p  . pantoprazole  80 mg Oral Daily   Continuous Infusions: . sodium chloride 50 mL/hr at 03/27/17 0600  . magnesium sulfate 1 - 4 g bolus IVPB    . norepinephrine (LEVOPHED) Adult infusion 3 mcg/min (03/27/17 0644)  . piperacillin-tazobactam (ZOSYN)  IV Stopped (03/27/17 6213)  . potassium chloride 10 mEq (03/27/17 0930)   PRN Meds:.acetaminophen **OR** acetaminophen, fluticasone, loratadine   SUBJECTIVE: She is feeling better today but still  requiring pressors.  She has not had any more nausea or vomiting and her diarrhea has decreased.  Review of Systems: Review of Systems  Constitutional: Negative for chills, diaphoresis and fever.  Respiratory: Negative for cough, sputum production and shortness of breath.   Cardiovascular: Negative for chest pain.  Gastrointestinal: Positive for diarrhea. Negative for nausea and vomiting.    Allergies  Allergen Reactions  . Iodine Anaphylaxis  . Latex Anaphylaxis  . Polymyxin B Hives    Pt had hives in right arm with infusion of polymyxin on November 26th, 2018  . Shellfish-Derived Products Anaphylaxis    Reaction unknown  . Vancomycin Anaphylaxis  . Aspirin Other (See Comments)    Reaction unknown  . Strawberry Extract Hives and Rash    OBJECTIVE: Vitals:   03/27/17 0500 03/27/17 0600 03/27/17 0700 03/27/17 0800  BP: 123/60 133/66 133/63   Pulse: 69 74 79   Resp: 10 (!) 8 13   Temp:    97.6 F (36.4 C)  TempSrc:    Oral  SpO2: 100% 99% 100%   Weight:      Height:       Body mass index is 23.74 kg/m.  Physical Exam  Constitutional: She is oriented to person, place, and time.  She is much more alert today.  She is in good spirits and talkative.  Cardiovascular: Normal rate and  regular rhythm.  Pulmonary/Chest: Effort normal. She has no wheezes. She has no rales.  Abdominal: Soft. She exhibits no distension. There is no tenderness.  Neurological: She is alert and oriented to person, place, and time.  Skin: No rash noted.  Left arm PICC site appears normal.  Psychiatric: Mood and affect normal.    Lab Results Lab Results  Component Value Date   WBC 13.6 (H) 03/27/2017   HGB 8.3 (L) 03/27/2017   HCT 27.6 (L) 03/27/2017   MCV 69.2 (L) 03/27/2017   PLT 173 03/27/2017    Lab Results  Component Value Date   CREATININE <0.30 (L) 03/27/2017   BUN 8 03/27/2017   NA 144 03/27/2017   K <2.0 (LL) 03/27/2017   CL 112 (H) 03/27/2017   CO2 26 03/27/2017    Lab  Results  Component Value Date   ALT 26 03/26/2017   AST 34 03/26/2017   ALKPHOS 153 (H) 03/26/2017   BILITOT 0.8 03/26/2017     Microbiology: Recent Results (from the past 240 hour(s))  Urine culture     Status: Abnormal   Collection Time: 03/26/17  2:23 AM  Result Value Ref Range Status   Specimen Description URINE, RANDOM  Final   Special Requests NONE  Final   Culture >=100,000 COLONIES/mL YEAST (A)  Final   Report Status 03/27/2017 FINAL  Final  C difficile quick scan w PCR reflex     Status: None   Collection Time: 03/26/17  5:46 AM  Result Value Ref Range Status   C Diff antigen NEGATIVE NEGATIVE Final   C Diff toxin NEGATIVE NEGATIVE Final   C Diff interpretation No C. difficile detected.  Final  MRSA PCR Screening     Status: None   Collection Time: 03/26/17  3:28 PM  Result Value Ref Range Status   MRSA by PCR NEGATIVE NEGATIVE Final    Comment:        The GeneXpert MRSA Assay (FDA approved for NASAL specimens only), is one component of a comprehensive MRSA colonization surveillance program. It is not intended to diagnose MRSA infection nor to guide or monitor treatment for MRSA infections.     Michel Bickers, MD [facility] for Infectious [facility] [place] [facility] [phone] pager   [phone] cell 03/27/2017, 9:41 AM

## 2017-03-27 NOTE — Progress Notes (Signed)
CRITICAL VALUE ALERT  Critical Value:  K 2.1 Phosphorus 1.0  Date & Time Notified:  03/27/2017 @1847   Provider Notified: Yes Dr. Nevada Crane  Orders Received/Actions taken: Awaiting MD orders

## 2017-03-27 NOTE — Consult Note (Signed)
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 Albumin is 2.3.  Poor potential for wound healing.  Mother is caregiver and currently has shoulder injury, in sling.  Patient with severe loose stools and wound contamination.  Flexi seal fecal containment in place at this time. Wound is clean and moist with intact dressing at this time.  Wound type:stage 4 pressure injury Pressure Injury POA: Yes Measurement: 9 cm x 3.6 cm x 0.3 cm, ischial bone palpable.  Wound FOY:DXAJ pale and nongranulating Drainage (amount, consistency, odor) minimal serosanguinous  No odor Periwound:intact Dressing procedure/placement/frequency:Cleanse ischial wound with NS and pat dry.  Apply NS moist gauze. Cover with 4x4 gauze and MEdipore tape. Change twice daily.  Will not follow at this time.  Please re-consult if needed.  Domenic Moras RN BSN [place] Pager [phone]

## 2017-03-27 NOTE — Progress Notes (Signed)
eLink Physician-Brief Progress Note Patient Name: [name] DOB: 09-03-1980 MRN: [id number]   Date of Service  03/27/2017  HPI/Events of Note  Hypokalemia and improved bicarb on bicarb gtt  eICU Interventions  Potassium replaced D/C bicarb gtt     Intervention Category Intermediate Interventions: Electrolyte abnormality - evaluation and management  DETERDING,ELIZABETH 03/27/2017, 5:02 AM

## 2017-03-27 NOTE — Progress Notes (Signed)
PROGRESS NOTE  [name] URK:[id number] DOB: 03/12/1981 DOA: 03/26/2017 PCP: Lennie Odor, PA-C  HPI/Recap of past 24 hours: Veronica E Myersis a 36 y.o.femalewith medical history significant ofparaplegia, MS, transverse myelitis, recently admitted last month for sacral decubitus with osteomyelitis. Discharged on 12/10 with a PICC line on tigacyclinewith PO levaquin. Presents this time with complaints of diarrhea and vomiting of 1 day duration. Upon presentation wbc 44 k and hypotensive. IV fluid boluses administered.C-diff pcr negative 03/26/17. ID following and recommends to continue antibiotics. Upon arrival to the unit patient unable to maintain MAP and started on pressors post 8L IV fluid NS blouses and 50 grams of albumin infusions. CCM consulted.  Overnight, reports of left facial droop CT head no contrast with no sign of acute findings. Electrolytes abnormalities that are being repleted.  Pt seen and examined with her mother at her bedside. She is alert and talkative. States not nauseated. Has minimal stool output this morning from flexiceal. Still on pressors. Will attempt to wean as tolerated. Breathing ambient air with no dyspnea or hypoxia.     Assessment/Plan: Principal Problem:   Diarrhea Active Problems:   Paraplegia (HCC)   History of transverse myelitis   Constipation   Osteomyelitis, pelvis (HCC)   Decubitus ulcer of left ischial area, stage III (HCC)   MDR Acinetobacter baumannii infection   Sepsis associated hypotension (HCC)   AKI (acute kidney injury) (HCC)   Somnolence   Facial droop  1. Severe sepsis, hypotension, with resolved AKI - 1. Negative C.Diff pcr 03/26/17 2. Avoid tigecycline due to interactions with her other meds 3. On po linezolid and IV zosyn 4. ID Dr. Megan Salon following 1. We appreciate recommendations 5. IVF: Received 8L NS bolus and 50 g albumin to maintain MAP prior to starting pressors in ICU. 6. UCx >100,000 yeast  colonies-ID aware. No plans for antifungals at this time. 7. BCx x 2 in process 8. Suspected adrenal insufficiency-on solucortef per CCM 2. Paraplegia - chronic and baseline 3. Osteomyelitis of pelvis - 1. ID following 2. IV zosyn 4. MDR ABC infection - 1. ID consult as above 2. Contact precautions 5. AKI - in setting of sepsis and hypotension, resolved 1. Monitor UOP 2. Cr <0.30 3. Hypokalemia/hypomagnesemia-repleting-BMP am   Code Status:  full  Family Communication: mother at bedside all questions answered to her satisfaction.  Disposition Plan: will stay another midnight until off pressors and hemodynamically stable.    Consultants:  CCM  ID  Procedures:  None  Antimicrobials:  Po linezolid  IV zosyn  DVT prophylaxis:  sq lovenox 40 mg daily   Objective: Vitals:   03/27/17 0400 03/27/17 0411 03/27/17 0500 03/27/17 0600  BP: 119/61  123/60 133/66  Pulse: 80  69 74  Resp: (!) 9  10 (!) 8  Temp:  97.6 F (36.4 C)    TempSrc:  Oral    SpO2: 100%  100% 99%  Weight:      Height:        Intake/Output Summary (Last 24 hours) at 03/27/2017 0715 Last data filed at 03/27/2017 0600 Gross per 24 hour  Intake 2975.55 ml  Output 2400 ml  Net 575.55 ml   Filed Weights   03/26/17 0553 03/26/17 1451 03/26/17 1600  Weight: 61.2 kg (135 lb) 61.2 kg (134 lb 14.7 oz) 60.8 kg (134 lb 0.6 oz)    Exam:  Constitutional: NAD, calm, comfortable. talkative Eyes: PERRL, lids and conjunctivae normal ENMT: Mucous membranes are moist. Posterior pharynx clear  of any exudate or lesions.Normal dentition.  Neck: normal, supple, no masses, no thyromegaly Respiratory: clear to auscultation bilaterally, no wheezing, no crackles. Normal respiratory effort. No accessory muscle use.  Cardiovascular: Regular rate and rhythm, no murmurs / rubs / gallops. No extremity edema. 2+ pedal pulses. No carotid bruits.  Abdomen: Urostomy in place RLQ Musculoskeletal: no clubbing /  cyanosis. No joint deformity upper and lower extremities. Good ROM, no contractures. Normal muscle tone.  Skin: L ischial decubitus ulcer, some stool contamination. Neurologic: Paraplegic Psychiatric: Normal judgment and insight. Alert and oriented x 3. Normal mood.    Data Reviewed: CBC: Recent Labs  Lab 03/26/17 0223 03/27/17 0339  WBC 44.0* 13.6*  NEUTROABS 39.6*  --   HGB 12.8 8.3*  HCT 43.2 27.6*  MCV 69.5* 69.2*  PLT 438* 825   Basic Metabolic Panel: Recent Labs  Lab 03/26/17 0223 03/27/17 0339  NA 137 144  K 3.9 <2.0*  CL 105 112*  CO2 18* 26  GLUCOSE 154* 167*  BUN 31* 8  CREATININE 1.37* <0.30*  CALCIUM 7.7* 7.2*  MG  --  1.4*   GFR: CrCl cannot be calculated (This lab value cannot be used to calculate CrCl because it is not a number: <0.30). Liver Function Tests: Recent Labs  Lab 03/26/17 0223  AST 34  ALT 26  ALKPHOS 153*  BILITOT 0.8  PROT 4.5*  ALBUMIN 2.3*   Recent Labs  Lab 03/26/17 0223  LIPASE 23   No results for input(s): AMMONIA in the last 168 hours. Coagulation Profile: No results for input(s): INR, PROTIME in the last 168 hours. Cardiac Enzymes: No results for input(s): CKTOTAL, CKMB, CKMBINDEX, TROPONINI in the last 168 hours. BNP (last 3 results) No results for input(s): PROBNP in the last 8760 hours. HbA1C: No results for input(s): HGBA1C in the last 72 hours. CBG: No results for input(s): GLUCAP in the last 168 hours. Lipid Profile: No results for input(s): CHOL, HDL, LDLCALC, TRIG, CHOLHDL, LDLDIRECT in the last 72 hours. Thyroid Function Tests: No results for input(s): TSH, T4TOTAL, FREET4, T3FREE, THYROIDAB in the last 72 hours. Anemia Panel: No results for input(s): VITAMINB12, FOLATE, FERRITIN, TIBC, IRON, RETICCTPCT in the last 72 hours. Urine analysis:    Component Value Date/Time   COLORURINE YELLOW 03/26/2017 0223   APPEARANCEUR CLOUDY (A) 03/26/2017 0223   LABSPEC 1.014 03/26/2017 0223   PHURINE 6.0  03/26/2017 0223   GLUCOSEU NEGATIVE 03/26/2017 0223   HGBUR SMALL (A) 03/26/2017 0223   BILIRUBINUR NEGATIVE 03/26/2017 0223   KETONESUR NEGATIVE 03/26/2017 0223   PROTEINUR 30 (A) 03/26/2017 0223   UROBILINOGEN 2.0 (H) 10/29/2014 1627   NITRITE NEGATIVE 03/26/2017 0223   LEUKOCYTESUR TRACE (A) 03/26/2017 0223   Sepsis Labs: @LABRCNTIP (procalcitonin:4,lacticidven:4)  ) Recent Results (from the past 240 hour(s))  C difficile quick scan w PCR reflex     Status: None   Collection Time: 03/26/17  5:46 AM  Result Value Ref Range Status   C Diff antigen NEGATIVE NEGATIVE Final   C Diff toxin NEGATIVE NEGATIVE Final   C Diff interpretation No C. difficile detected.  Final  MRSA PCR Screening     Status: None   Collection Time: 03/26/17  3:28 PM  Result Value Ref Range Status   MRSA by PCR NEGATIVE NEGATIVE Final    Comment:        The GeneXpert MRSA Assay (FDA approved for NASAL specimens only), is one component of a comprehensive MRSA colonization surveillance program. It is not  intended to diagnose MRSA infection nor to guide or monitor treatment for MRSA infections.       Studies: Ct Head Wo Contrast  Result Date: 03/26/2017 CLINICAL DATA:  36 y/o F; history of multiple sclerosis and paraplegia. Possible septic shock. EXAM: CT HEAD WITHOUT CONTRAST TECHNIQUE: Contiguous axial images were obtained from the base of the skull through the vertex without intravenous contrast. COMPARISON:  11/15/2016 MRI of the head.  12/12/2009 CT of the head. FINDINGS: Brain: No evidence of acute infarction, hemorrhage, hydrocephalus, extra-axial collection or mass lesion/mass effect. Stable mild brain parenchymal volume loss. Vascular: No hyperdense vessel or unexpected calcification. Skull: Normal. Negative for fracture or focal lesion. Sinuses/Orbits: Partial opacification of bilateral maxillary sinuses with retention cysts. Otherwise negative. Other: None. IMPRESSION: 1. No acute intracranial  abnormality identified. 2. Stable mild brain parenchymal volume loss. Electronically Signed   By: Kristine Garbe M.D.   On: 03/26/2017 22:06    Scheduled Meds: . chlorhexidine  15 mL Mouth Rinse BID  . enoxaparin (LOVENOX) injection  40 mg Subcutaneous QHS  . hydrocortisone sod succinate (SOLU-CORTEF) inj  50 mg Intravenous Q6H  . linezolid  600 mg Oral Q12H  . mouth rinse  15 mL Mouth Rinse q12n4p  . pantoprazole  80 mg Oral Daily    Continuous Infusions: . sodium chloride 50 mL/hr at 03/27/17 0600  . norepinephrine (LEVOPHED) Adult infusion 3 mcg/min (03/27/17 0644)  . piperacillin-tazobactam (ZOSYN)  IV Stopped (03/27/17 5170)  . potassium chloride 10 mEq (03/27/17 0707)     LOS: 1 day     Kayleen Memos, MD [facility] Pager [phone]  If 7PM-7AM, please contact night-coverage [email or web address] Password [email or web address] 03/27/2017, 7:15 AM

## 2017-03-27 NOTE — Progress Notes (Signed)
[facility] PCCM PROGRESS NOTE  Date of Admission: 03/26/2017 Date of Consult: 03/26/2017 Referring Provider: Dr. Nevada Crane, [facility] Chief Complaint: Low blood pressure  HPI: 36 yo female with hx of paraplegia in setting of transverse myelitis and MS was in hospital from 02/13/17 to 03/12/17 with sacral pressure ulcer with osteomyelitis.  She was d/c home on IV Abx through PICC line.  Her mother remotes she started getting nausea, vomiting and diarrhea after eating something funny.  She hasn't been each much since.  She had stool for C diff that was negative.  ID was consulted and abx adjusted.  She has required multiple boluses of IV fluid, but blood pressure remains low.  She has significant worsening of her creatinine and increase in her hemoglobin compared to recent admission, and these would be consistent with significant volume depletion.  She had elevated lactic acid and this has trended down.  She was still getting her medications including baclofen, valium benadryl, neurontin, lamictal, and zoloft.  She was started on pressor agents.  Her mother reports her normal blood pressure is 90's to low 100's.  Past Medical History: She  has a past medical history of Bladder stones, Depression, GERD (gastroesophageal reflux disease), Multiple sclerosis ([place]), Paraplegia ([place]), PTSD (post-traumatic stress disorder), and Transverse myelitis ([place]).  Subjective: More alert.  Denies chest pain.  Vital signs: BP 133/63   Pulse 79   Temp 97.6 F (36.4 C) (Oral)   Resp 13   Ht 5\' 3"  (1.6 m)   Wt 134 lb 0.6 oz (60.8 kg)   SpO2 100%   BMI 23.74 kg/m   Intake/Output: I/O last 3 completed shifts: In: 5025.6 [I.V.:1675.6; IV Piggyback:3350] Out: 2400 [Urine:2400]  Physical Exam:  General - pleasant Eyes - pupils reactive ENT - no sinus tenderness, no oral exudate, no LAN Cardiac - regular, no murmur Chest - no wheeze, rales Abd - soft, non tender, urostomy bag in place Ext - 1+ edema Skin - sacral  pressure wound Neuro - normal strength Psych - normal mood  Discussion: 36 yo female with hypotension from sepsis and volume depletion in setting of nausea, vomiting, and diarrhea.  Source of sepsis is likely sacral wound and/or viral gastroenteritis.  She could also have relative adrenal insufficiency.  Altered mental status likely related to metabolic derangements and ineffective clearance of her medications in setting of renal insufficiency.  Mental status, renal fx, and hemodynamics improved 12/26.  Assessment/Plan:  Hypotension. - from sepsis, volume depletion, and relative adrenal insufficiency - wean pressors to keep MAP > 65 - continue IV fluids - continue solu cortef until off pressors  Sepsis likely from sacral wound. - previous wound cultures grew Proteus, MRSA, MDRO/CRE Acinetobacter - Abx per ID  Hypokalemia, hypomagnesemia. 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 - defer to primary team about resuming baclofen, valium, benadryl, neurontin, and lamictal - hold zoloft while on linezolid due to concern for developing serotonin syndrome  Nausea, vomiting, diarrhea. - hold outpt amitiza - would avoid zofran in setting of linezolid use due to potential for developing serotonin syndrome  DVT prophylaxis: Lovenox SUP: Protonix Diet: Regular diet Goals of care: Full code Disposition: Mother is caregiver at home.  Unfortunately, she has shoulder fracture.  Will need to arrange for extra home health assistance when [place] is ready for discharge.  Updated mother at bedside.  Discussed with Dr. Nevada Crane.  Chesley Mires, MD [facility] Pulmonary/Critical Care 03/27/2017, 8:51 AM Pager:  [phone] After 3pm call: [phone]  FLOW SHEET  Cultures: GI Panel 12/25 >> Blood 12/25 >> Urine 12/25 >> C diff PCR 12/25 >> negative  Antibiotics: Zosyn 12/24 >> Linezolid 12/24 >>  Studies: MR  Pelvis 11/17 >> deep soft tissue ulcer extending to the left ischial tuberosity, early osteomyelitis, proctitis CT head 12/25 >> mild parenchymal volume loss  Events: 12/24 Admit  12/25 Start pressors  Lines/tubes: Lt PICC 11/26 >>   Consults: 12/25 ID  Resolved Problems: Lactic acidosis, acute renal failure, non anion gap metabolic acidosis, acute metabolic encephalopathy  Labs: CMP Latest Ref Rng & Units 03/27/2017 03/26/2017 03/08/2017  Glucose 65 - 99 mg/dL 167(H) 154(H) 88  BUN 6 - 20 mg/dL 8 31(H) 16  Creatinine 0.44 - 1.00 mg/dL <0.30(L) 1.37(H) 0.30(L)  Sodium 135 - 145 mmol/L 144 137 139  Potassium 3.5 - 5.1 mmol/L <2.0(LL) 3.9 4.0  Chloride 101 - 111 mmol/L 112(H) 105 107  CO2 22 - 32 mmol/L 26 18(L) 26  Calcium 8.9 - 10.3 mg/dL 7.2(L) 7.7(L) 8.6(L)  Total Protein 6.5 - 8.1 g/dL - 4.5(L) -  Total Bilirubin 0.3 - 1.2 mg/dL - 0.8 -  Alkaline Phos 38 - 126 U/L - 153(H) -  AST 15 - 41 U/L - 34 -  ALT 14 - 54 U/L - 26 -    CBC Latest Ref Rng & Units 03/27/2017 03/26/2017 03/08/2017  WBC 4.0 - 10.5 K/uL 13.6(H) 44.0(H) 4.2  Hemoglobin 12.0 - 15.0 g/dL 8.3(L) 12.8 7.7(L)  Hematocrit 36.0 - 46.0 % 27.6(L) 43.2 26.7(L)  Platelets 150 - 400 K/uL 173 438(H) 199    CBG (last 3)  No results for input(s): GLUCAP in the last 72 hours.  Imaging: Ct Head Wo Contrast  Result Date: 03/26/2017 CLINICAL DATA:  36 y/o F; history of multiple sclerosis and paraplegia. Possible septic shock. EXAM: CT HEAD WITHOUT CONTRAST TECHNIQUE: Contiguous axial images were obtained from the base of the skull through the vertex without intravenous contrast. COMPARISON:  11/15/2016 MRI of the head.  12/12/2009 CT of the head. FINDINGS: Brain: No evidence of acute infarction, hemorrhage, hydrocephalus, extra-axial collection or mass lesion/mass effect. Stable mild brain parenchymal volume loss. Vascular: No hyperdense vessel or unexpected calcification. Skull: Normal. Negative for fracture or focal  lesion. Sinuses/Orbits: Partial opacification of bilateral maxillary sinuses with retention cysts. Otherwise negative. Other: None. IMPRESSION: 1. No acute intracranial abnormality identified. 2. Stable mild brain parenchymal volume loss. Electronically Signed   By: Kristine Garbe M.D.   On: 03/26/2017 22:06   Dg Chest Port 1 View  Result Date: 03/26/2017 CLINICAL DATA:  Acute onset of hypotension. EXAM: PORTABLE CHEST 1 VIEW COMPARISON:  Chest radiograph performed 03/07/2017 FINDINGS: The lungs are well-aerated and clear. There is no evidence of focal opacification, pleural effusion or pneumothorax. The cardiomediastinal silhouette is within normal limits. No acute osseous abnormalities are seen. A left PICC is noted ending about the distal SVC. IMPRESSION: No acute cardiopulmonary process seen. Electronically Signed   By: Garald Balding M.D.   On: 03/26/2017 04:04

## 2017-03-28 DIAGNOSIS — A499 Bacterial infection, unspecified: Secondary | ICD-10-CM

## 2017-03-28 DIAGNOSIS — Z1624 Resistance to multiple antibiotics: Secondary | ICD-10-CM

## 2017-03-28 DIAGNOSIS — L89323 Pressure ulcer of left buttock, stage 3: Secondary | ICD-10-CM

## 2017-03-28 LAB — PHOSPHORUS: PHOSPHORUS: 2.9 mg/dL (ref 2.5–4.6)

## 2017-03-28 LAB — BASIC METABOLIC PANEL
ANION GAP: 5 (ref 5–15)
BUN: <5 mg/dL — ABNORMAL LOW (ref 6–20)
CHLORIDE: 106 mmol/L (ref 101–111)
CO2: 26 mmol/L (ref 22–32)
Calcium: 7.3 mg/dL — ABNORMAL LOW (ref 8.9–10.3)
Glucose, Bld: 114 mg/dL — ABNORMAL HIGH (ref 65–99)
POTASSIUM: 3.4 mmol/L — AB (ref 3.5–5.1)
SODIUM: 137 mmol/L (ref 135–145)

## 2017-03-28 LAB — CBC
HCT: 23.4 % — ABNORMAL LOW (ref 36.0–46.0)
HEMOGLOBIN: 7 g/dL — AB (ref 12.0–15.0)
MCH: 20.6 pg — ABNORMAL LOW (ref 26.0–34.0)
MCHC: 29.9 g/dL — AB (ref 30.0–36.0)
MCV: 68.8 fL — ABNORMAL LOW (ref 78.0–100.0)
Platelets: 131 10*3/uL — ABNORMAL LOW (ref 150–400)
RBC: 3.4 MIL/uL — ABNORMAL LOW (ref 3.87–5.11)
RDW: 21.4 % — ABNORMAL HIGH (ref 11.5–15.5)
WBC: 6.8 10*3/uL (ref 4.0–10.5)

## 2017-03-28 LAB — MAGNESIUM: MAGNESIUM: 1.7 mg/dL (ref 1.7–2.4)

## 2017-03-28 MED ORDER — PHENYLEPHRINE HCL-NACL 10-0.9 MG/250ML-% IV SOLN
0.0000 ug/min | INTRAVENOUS | Status: DC
  Administered 2017-03-28: 10 ug/min via INTRAVENOUS

## 2017-03-28 MED ORDER — UNJURY CHICKEN SOUP POWDER
8.0000 [oz_av] | Freq: Every day | ORAL | Status: DC
Start: 2017-03-28 — End: 2017-04-03
  Administered 2017-03-29 – 2017-03-31 (×3): 8 [oz_av] via ORAL
  Filled 2017-03-28 (×4): qty 27

## 2017-03-28 MED ORDER — BACLOFEN 10 MG PO TABS
5.0000 mg | ORAL_TABLET | Freq: Three times a day (TID) | ORAL | Status: DC
  Administered 2017-03-28 – 2017-04-03 (×19): 5 mg via ORAL
  Filled 2017-03-28 (×20): qty 1

## 2017-03-28 MED ORDER — PHENYLEPHRINE HCL-NACL 10-0.9 MG/250ML-% IV SOLN
INTRAVENOUS | Status: AC
  Filled 2017-03-28: qty 250

## 2017-03-28 MED ORDER — DIPHENHYDRAMINE HCL 25 MG PO CAPS: 25.0000 mg | ORAL_CAPSULE | Freq: Four times a day (QID) | ORAL | Status: DC | PRN

## 2017-03-28 MED ORDER — CHLORPROMAZINE HCL 25 MG/ML IJ SOLN
25.0000 mg | Freq: Once | INTRAMUSCULAR | Status: AC
  Administered 2017-03-28: 25 mg via INTRAMUSCULAR
  Filled 2017-03-28: qty 1

## 2017-03-28 MED ORDER — NOREPINEPHRINE 4 MG/250ML-% IV SOLN
0.0000 ug/min | INTRAVENOUS | Status: DC
  Filled 2017-03-28: qty 250

## 2017-03-28 MED ORDER — LAMOTRIGINE 25 MG PO TABS
50.0000 mg | ORAL_TABLET | Freq: Every day | ORAL | Status: DC
  Administered 2017-03-28 – 2017-04-03 (×7): 50 mg via ORAL
  Filled 2017-03-28 (×7): qty 2

## 2017-03-28 MED ORDER — NOREPINEPHRINE 4 MG/250ML-% IV SOLN
0.0000 ug/min | INTRAVENOUS | Status: DC
  Administered 2017-03-29: 2 ug/min via INTRAVENOUS
  Filled 2017-03-28: qty 250

## 2017-03-28 MED ORDER — SODIUM CHLORIDE 0.9 % IV BOLUS (SEPSIS)
500.0000 mL | Freq: Once | INTRAVENOUS | Status: AC
  Administered 2017-03-28: 500 mL via INTRAVENOUS

## 2017-03-28 MED ORDER — CHLORHEXIDINE GLUCONATE CLOTH 2 % EX PADS
6.0000 | MEDICATED_PAD | Freq: Every day | CUTANEOUS | Status: DC
  Administered 2017-03-28 – 2017-03-29 (×2): 6 via TOPICAL

## 2017-03-28 MED ORDER — MAGNESIUM SULFATE 2 GM/50ML IV SOLN
2.0000 g | Freq: Once | INTRAVENOUS | Status: AC
  Administered 2017-03-28: 2 g via INTRAVENOUS
  Filled 2017-03-28: qty 50

## 2017-03-28 MED ORDER — BOOST / RESOURCE BREEZE PO LIQD CUSTOM
1.0000 | Freq: Two times a day (BID) | ORAL | Status: DC
  Administered 2017-03-29 – 2017-04-03 (×4): 1 via ORAL

## 2017-03-28 MED ORDER — DIAZEPAM 5 MG PO TABS
5.0000 mg | ORAL_TABLET | Freq: Three times a day (TID) | ORAL | Status: DC
  Administered 2017-03-28 – 2017-04-03 (×18): 5 mg via ORAL
  Filled 2017-03-28 (×19): qty 1

## 2017-03-28 MED ORDER — GABAPENTIN 300 MG PO CAPS
300.0000 mg | ORAL_CAPSULE | Freq: Three times a day (TID) | ORAL | Status: DC
  Administered 2017-03-28 – 2017-04-03 (×19): 300 mg via ORAL
  Filled 2017-03-28 (×20): qty 1

## 2017-03-28 MED ORDER — HYDROCORTISONE NA SUCCINATE PF 100 MG IJ SOLR
50.0000 mg | Freq: Two times a day (BID) | INTRAMUSCULAR | Status: DC
  Administered 2017-03-28 – 2017-03-31 (×6): 50 mg via INTRAVENOUS
  Filled 2017-03-28 (×6): qty 2

## 2017-03-28 NOTE — Progress Notes (Signed)
[name] for Infectious Disease    Date of Admission:  03/26/2017   Total days of antibiotics 4        Day 4 piptazo        Day 4 linezolid          ID: [name] is a 36 y.o. female with paraplegia with hx of transverse myelitis. Recently admitted for osteo, now readmitted for sepsis, improved on piptazo plus linezolid Principal Problem:   Diarrhea Active Problems:   Paraplegia ([name])   History of transverse myelitis   Constipation   Osteomyelitis, pelvis (HCC)   Decubitus ulcer of left ischial area, stage III (HCC)   MDR Acinetobacter baumannii infection   Sepsis associated hypotension (HCC)   AKI (acute kidney injury) (HCC)   Somnolence   Facial droop    Subjective: Afebrile, leukocytosis improved Still is slow to articulating how she is feeling, but better than on admit  Still on low dose pressors  Medications:  . baclofen  5 mg Oral Q8H  . chlorhexidine  15 mL Mouth Rinse BID  . Chlorhexidine Gluconate Cloth  6 each Topical Q0600  . diazepam  5 mg Oral Q8H  . enoxaparin (LOVENOX) injection  40 mg Subcutaneous QHS  . gabapentin  300 mg Oral Q8H  . hydrocortisone sod succinate (SOLU-CORTEF) inj  50 mg Intravenous Q12H  . lamoTRIgine  50 mg Oral Daily  . linezolid  600 mg Oral Q12H  . mouth rinse  15 mL Mouth Rinse q12n4p  . potassium chloride  40 mEq Oral TID    Objective: Vital signs in last 24 hours: Temp:  [97.6 F (36.4 C)-98 F (36.7 C)] 97.7 F (36.5 C) (12/27 0800) Pulse Rate:  [73-95] 89 (12/27 1000) Resp:  [10-25] 23 (12/27 1000) BP: (86-149)/(37-74) 86/74 (12/27 1000) SpO2:  [98 %-100 %] 99 % (12/27 1000) Physical Exam  Constitutional:  oriented to person, place, and time. appears flushed cheeks. No distress.  HENT: [place]/AT, PERRLA, no scleral icterus, poor dentition Mouth/Throat: Oropharynx is clear and moist. No oropharyngeal exudate.  Cardiovascular: Normal rate, regular rhythm and normal heart sounds. Exam reveals no gallop  and no friction rub.  No murmur heard.  Pulmonary/Chest: Effort normal and breath sounds normal. No respiratory distress.  has no wheezes.  Neck = supple, no nuchal rigidity Abdominal: Soft. Bowel sounds are hypoactive.  Lymphadenopathy: no cervical adenopathy. No axillary adenopathy Neurological: alert and oriented to person, place, and time. Lower extremity wasting, spasms for paraplegia Ext: non pitting edema Skin: Skin is warm and dry. No rash noted. No erythema.  Psychiatric: a normal mood and affect.  behavior is normal.    Lab Results Recent Labs    03/27/17 0339 03/27/17 1755 03/28/17 0323  WBC 13.6*  --  6.8  HGB 8.3*  --  7.0*  HCT 27.6*  --  23.4*  NA 144 141 137  K <2.0* 2.1* 3.4*  CL 112* 111 106  CO2 26 23 26   BUN 8 6 <5*  CREATININE <0.30* 0.38* <0.30*   Liver Panel Recent Labs    03/26/17 0223  PROT 4.5*  ALBUMIN 2.3*  AST 34  ALT 26  ALKPHOS 153*  BILITOT 0.8   Lab Results  Component Value Date   ESRSEDRATE 7 02/15/2017   Lab Results  Component Value Date   CRP 6.8 (H) 02/15/2017    Microbiology: 12/25 blood cx ngtd Studies/Results: Ct Head Wo Contrast  Result Date: 03/26/2017 CLINICAL DATA:  36 y/o F; history of multiple sclerosis and paraplegia. Possible septic shock. EXAM: CT HEAD WITHOUT CONTRAST TECHNIQUE: Contiguous axial images were obtained from the base of the skull through the vertex without intravenous contrast. COMPARISON:  11/15/2016 MRI of the head.  12/12/2009 CT of the head. FINDINGS: Brain: No evidence of acute infarction, hemorrhage, hydrocephalus, extra-axial collection or mass lesion/mass effect. Stable mild brain parenchymal volume loss. Vascular: No hyperdense vessel or unexpected calcification. Skull: Normal. Negative for fracture or focal lesion. Sinuses/Orbits: Partial opacification of bilateral maxillary sinuses with retention cysts. Otherwise negative. Other: None. IMPRESSION: 1. No acute intracranial abnormality  identified. 2. Stable mild brain parenchymal volume loss. Electronically Signed   By: Kristine Garbe M.D.   On: 03/26/2017 22:06    Assessment/Plan:  sepsis due to ischial decubitus/osteo = continue on linezolid plus piptazo for now. Has hx of mdro, currently not being covered- since we were previously treating.  Diarrhea = improved, possibly related to tigecycline and FQ  Leukocytosis = continues to improve  Severe sepsis = slowly being tapered off of pressors.   Hx of MDRO = room will need xenex-UV treatment prior to new patient   [facility] for Infectious Diseases Cell: [phone] Pager: [phone]  03/28/2017, 10:53 AM

## 2017-03-28 NOTE — Progress Notes (Signed)
Initial Nutrition Assessment  DOCUMENTATION CODES:   Not applicable  INTERVENTION:  - Will order Boost Breeze BID, each supplement provides 250 kcal and 9 grams of protein. - Will order [place] Chicken Soup once/day, each 8 ozserving provides 100 kcal and 21 grams of protein.  - Diet advancement as medically feasible, as arousability allows. - RD will continue to monitor for additional nutrition-related needs and will attempt to perform NFPE at follow-up.   NUTRITION DIAGNOSIS:   Inadequate oral intake related to lethargy/confusion, acute illness, other (see comment)(current diet order) as evidenced by per patient/family report, other (comment)(CLD does not meet estimated needs).  GOAL:   Patient will meet greater than or equal to 90% of their needs  MONITOR:   PO intake, Supplement acceptance, Diet advancement, Weight trends, Labs, Skin, I & O's  REASON FOR ASSESSMENT:   Low [place]  ASSESSMENT:   36 y.o. female with medical history significant of paraplegia, MS, transverse myelitis, recently admitted last month for sacral decubitus with osteomyelitis. Discharged on 12/10 with a PICC line on tigacycline with PO levaquin. Presents this time with complaints of diarrhea and vomiting of 1 day duration. Upon presentation wbc 44 k and hypotensive. IV fluid boluses administered. C-diff pcr negative 03/26/17. ID following and recommends to continue antibiotics. Upon arrival to the unit patient unable to maintain MAP and started on pressors post 8L IV fluid NS blouses and 50 grams of albumin infusions. CCM consulted.  BMI indicates normal weight. Pt with paraplegia. Pt sleeping at this time and discussed with Dr. Nevada Crane outside of pt's room. She reports recurrent infection to buttocks wound(s) d/t contamination with stool and that mom (who is at bedside) is reluctant to consider diverting colostomy; concern for ongoing contamination and ongoing infection d/t this. Pt currently has  flexi-seal.  During RD visit with mom at bedside, it was very difficult to obtain much information as much of the time was spent with mom focusing on her and other family member's health conditions, the loss of her husband and father-in-law, and her concern that she will be "pad locked out" of her house d/t fear that EMS will report her for the condition of the home. Mom also highly anxious about pt's current medical condition and plan moving forward in addition to extreme fear of pt passing away in the hospital. Was able to elicit very little information about the patient other than that she had a very good appetite at home and liked items such as balsamic chicken, sesame chicken, and that pt and mom would go to [facility] often for meals. Mom states that pt has been taking Valium TID for several years which causes pt to sleep most of the day.   Per chart review, pt was last seen by an RD on 12/4 at which time pt was consuming 1 Boost Plus shake per day and was eating 75-100% of meals, on average. Per chart review, pt has lost 6 lbs (4.3% body weight) in the past 2 months which is not significant for time frame.  Medications reviewed; 50 mg Solu-cortef BID, 2 g IV Mg sulfate x1 run today, 40 mEq oral KCl x3 doses yesterday, 30 mmol IV KPhos x1 run yesterday, 40 mEq IV KPhos x1 run yesterday.  Labs reviewed; K: 3.4 mmol/L, BUN: <5 mg/dL, creatinine: <0.3 mg/dL, Ca: 7.3 mg/dL.  IVF: NS @ 75 mL/hr.     NUTRITION - FOCUSED PHYSICAL EXAM:  Unable to perform/assess per mom's request.  Diet Order:  Diet clear  liquid Room service appropriate? Yes; Fluid consistency: Thin  EDUCATION NEEDS:   Not appropriate for education at this time  Skin:  Skin Assessment: Skin Integrity Issues: Skin Integrity Issues:: Stage I, Other (Comment) Stage I: L heel Other: wounds to bilateral buttocks with recurrent infection/contamination with stool  Last BM:  12/25 (date of admission)  Height:   Ht  Readings from Last 1 Encounters:  03/26/17 5\' 3"  (1.6 m)    Weight:   Wt Readings from Last 1 Encounters:  03/26/17 134 lb 0.6 oz (60.8 kg)    Ideal Body Weight:  52.27 kg  BMI:  Body mass index is 23.74 kg/m.  Estimated Nutritional Needs:   Kcal:  6811-5726 (28-32 kcal/kg)  Protein:  80-90 grams  Fluid:  >/= 2 L/day     Jarome Matin, MS, RD, LDN, [facility] Inpatient Clinical Dietitian Pager # [phone] After hours/weekend pager # [phone]

## 2017-03-28 NOTE — Progress Notes (Signed)
PROGRESS NOTE  [name] PIR:[id number] DOB: 08/05/1980 DOA: 03/26/2017 PCP: Lennie Odor, PA-C  HPI/Recap of past 24 hours: Veronica E Myersis a 36 y.o.femalewith medical history significant ofparaplegia, MS, transverse myelitis, recently admitted last month for sacral decubitus with osteomyelitis. Discharged on 12/10 with a PICC line on tigacyclinewith PO levaquin. Presents this time with complaints of diarrhea and vomiting of 1 day duration. Upon presentation wbc 44 k and hypotensive. IV fluid boluses administered.C-diff pcr negative 03/26/17. ID following and recommends to continue antibiotics. Upon arrival to the unit patient unable to maintain MAP and started on pressors post 8L IV fluid NS blouses and 50 grams of albumin infusions. CCM consulted.  Hypotensive and not able to maintain MAP 65 or above. MAP dropped to 40 after stopping pressors.  Assessment/Plan: Principal Problem:   Diarrhea Active Problems:   Paraplegia (HCC)   History of transverse myelitis   Constipation   Osteomyelitis, pelvis (HCC)   Decubitus ulcer of left ischial area, stage III (HCC)   MDR Acinetobacter baumannii infection   Sepsis associated hypotension (HCC)   AKI (acute kidney injury) (HCC)   Somnolence   Facial droop  1. Severe sepsis, hypotension, with resolved AKI - 1. Negative C.Diff pcr 03/26/17 2. Avoid tigecycline due to interactions with her other meds 3. On po linezolid and IV zosyn 4. ID Dr. Megan Salon following 1. We appreciate recommendations 5. IVF: Received 8L NS bolus and 50 g albumin to maintain MAP prior to starting pressors in ICU. 6. UCx >100,000 yeast colonies-ID aware. No plans for antifungals at this time. 7. BCx x 2 in process 8. Suspected adrenal insufficiency-on solucortef per CCM 9. Maintain MAP 65 or above. On levophed day#2 will wean off as tolerated 2. Paraplegia - chronic and baseline 3. Osteomyelitis of pelvis - 1. ID following 2. IV zosyn 4. MDR  ABC infection - 1. ID consult as above 2. Contact precautions 5. AKI - in setting of sepsis and hypotension, resolved 1. Monitor UOP 2. Cr <0.30 3. Hypokalemia/hypomagnesemia-repleting-BMP am  6. 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  Disposition Plan: will stay another midnight until off pressors and hemodynamically stable.    Consultants:  CCM  ID  Procedures:  None  Antimicrobials:  Po linezolid  IV zosyn  DVT prophylaxis:  sq lovenox 40 mg daily   Objective: Vitals:   03/28/17 0335 03/28/17 0400 03/28/17 0500 03/28/17 0600  BP:  (!) 113/58 (!) 102/45 (!) 112/59  Pulse:  86 78 73  Resp:  12 12 13   Temp: 97.6 F (36.4 C)     TempSrc: Oral     SpO2:  99% 98% 98%  Weight:      Height:        Intake/Output Summary (Last 24 hours) at 03/28/2017 0713 Last data filed at 03/28/2017 0600 Gross per 24 hour  Intake 1569.81 ml  Output 550 ml  Net 1019.81 ml   Filed Weights   03/26/17 0553 03/26/17 1451 03/26/17 1600  Weight: 61.2 kg (135 lb) 61.2 kg (134 lb 14.7 oz) 60.8 kg (134 lb 0.6 oz)    Exam:  Constitutional: NAD, calm, comfortable. talkative Eyes: PERRL, lids and conjunctivae normal ENMT: Mucous membranes  are moist. Posterior pharynx clear of any exudate or lesions.Normal dentition.  Neck: normal, supple, no masses, no thyromegaly Respiratory: clear to auscultation bilaterally, no wheezing, no crackles. Normal respiratory effort. No accessory muscle use.  Cardiovascular: Regular rate and rhythm, no murmurs / rubs / gallops. No extremity edema. 2+ pedal pulses. No carotid bruits.  Abdomen:  Urostomy in place RLQ Musculoskeletal: no clubbing / cyanosis. No joint deformity upper and lower extremities. Good ROM, no contractures. Normal muscle tone.  Skin: L ischial decubitus ulcer, some stool contamination. Neurologic: Paraplegic Psychiatric: Normal judgment and insight. Alert and oriented x 3. Normal mood.    Data Reviewed: CBC: Recent Labs  Lab 03/26/17 0223 03/27/17 0339 03/28/17 0323  WBC 44.0* 13.6* 6.8  NEUTROABS 39.6*  --   --   HGB 12.8 8.3* 7.0*  HCT 43.2 27.6* 23.4*  MCV 69.5* 69.2* 68.8*  PLT 438* 173 229*   Basic Metabolic Panel: Recent Labs  Lab 03/26/17 0223 03/27/17 0339 03/27/17 1755 03/28/17 0323  NA 137 144 141 137  K 3.9 <2.0* 2.1* 3.4*  CL 105 112* 111 106  CO2 18* 26 23 26   GLUCOSE 154* 167* 236* 114*  BUN 31* 8 6 <5*  CREATININE 1.37* <0.30* 0.38* <0.30*  CALCIUM 7.7* 7.2* 7.5* 7.3*  MG  --  1.4* 1.9 1.7  PHOS  --   --  1.0* 2.9   GFR: CrCl cannot be calculated (This lab value cannot be used to calculate CrCl because it is not a number: <0.30). Liver Function Tests: Recent Labs  Lab 03/26/17 0223  AST 34  ALT 26  ALKPHOS 153*  BILITOT 0.8  PROT 4.5*  ALBUMIN 2.3*   Recent Labs  Lab 03/26/17 0223  LIPASE 23   No results for input(s): AMMONIA in the last 168 hours. Coagulation Profile: No results for input(s): INR, PROTIME in the last 168 hours. Cardiac Enzymes: No results for input(s): CKTOTAL, CKMB, CKMBINDEX, TROPONINI in the last 168 hours. BNP (last 3 results) No results for input(s): PROBNP in the last 8760 hours. HbA1C: No results for input(s): HGBA1C in the last 72 hours. CBG: Recent Labs  Lab 03/27/17 2121  GLUCAP 164*   Lipid Profile: No results for input(s): CHOL, HDL, LDLCALC, TRIG, CHOLHDL, LDLDIRECT in the last 72 hours. Thyroid Function Tests: No results for input(s): TSH, T4TOTAL, FREET4, T3FREE, THYROIDAB in the last 72 hours. Anemia Panel: No results for input(s): VITAMINB12, FOLATE,  FERRITIN, TIBC, IRON, RETICCTPCT in the last 72 hours. Urine analysis:    Component Value Date/Time   COLORURINE YELLOW 03/26/2017 0223   APPEARANCEUR CLOUDY (A) 03/26/2017 0223   LABSPEC 1.014 03/26/2017 0223   PHURINE 6.0 03/26/2017 0223   GLUCOSEU NEGATIVE 03/26/2017 0223   HGBUR SMALL (A) 03/26/2017 0223   BILIRUBINUR NEGATIVE 03/26/2017 0223   KETONESUR NEGATIVE 03/26/2017 0223   PROTEINUR 30 (A) 03/26/2017 0223   UROBILINOGEN 2.0 (H) 10/29/2014 1627   NITRITE NEGATIVE 03/26/2017 0223   LEUKOCYTESUR TRACE (A) 03/26/2017 0223   Sepsis Labs: @LABRCNTIP (procalcitonin:4,lacticidven:4)  ) Recent Results (from the past 240 hour(s))  Urine culture     Status: Abnormal   Collection Time: 03/26/17  2:23 AM  Result Value Ref Range Status   Specimen Description URINE, RANDOM  Final   Special Requests NONE  Final   Culture >=100,000 COLONIES/mL YEAST (A)  Final   Report Status 03/27/2017 FINAL  Final  Blood Culture (routine x 2)     Status: None (Preliminary result)   Collection Time:  03/26/17  2:24 AM  Result Value Ref Range Status   Specimen Description BLOOD RIGHT WRIST  Final   Special Requests IN PEDIATRIC BOTTLE Blood Culture adequate volume  Final   Culture   Final    NO GROWTH 1 DAY Performed at [facility], [place] [address], [place], [place] [address]    Report Status PENDING  Incomplete  Blood Culture (routine x 2)     Status: None (Preliminary result)   Collection Time: 03/26/17  2:26 AM  Result Value Ref Range Status   Specimen Description BLOOD PICC LINE  Final   Special Requests   Final    BOTTLES DRAWN AEROBIC AND ANAEROBIC Blood Culture adequate volume   Culture   Final    NO GROWTH 1 DAY Performed at [facility], [place] [address] [place], [place] [address]    Report Status PENDING  Incomplete  Gastrointestinal Panel by PCR , Stool     Status: None   Collection Time: 03/26/17  5:46 AM  Result Value Ref Range Status   Campylobacter species NOT  DETECTED NOT DETECTED Final   Plesimonas shigelloides NOT DETECTED NOT DETECTED Final   Salmonella species NOT DETECTED NOT DETECTED Final   Yersinia enterocolitica NOT DETECTED NOT DETECTED Final   Vibrio species NOT DETECTED NOT DETECTED Final   Vibrio cholerae NOT DETECTED NOT DETECTED Final   Enteroaggregative E coli (EAEC) NOT DETECTED NOT DETECTED Final   Enteropathogenic E coli (EPEC) NOT DETECTED NOT DETECTED Final   Enterotoxigenic E coli (ETEC) NOT DETECTED NOT DETECTED Final   Shiga like toxin producing E coli (STEC) NOT DETECTED NOT DETECTED Final   Shigella/Enteroinvasive E coli (EIEC) NOT DETECTED NOT DETECTED Final   Cryptosporidium NOT DETECTED NOT DETECTED Final   Cyclospora cayetanensis NOT DETECTED NOT DETECTED Final   Entamoeba histolytica NOT DETECTED NOT DETECTED Final   Giardia lamblia NOT DETECTED NOT DETECTED Final   Adenovirus F40/41 NOT DETECTED NOT DETECTED Final   Astrovirus NOT DETECTED NOT DETECTED Final   Norovirus GI/GII NOT DETECTED NOT DETECTED Final   Rotavirus A NOT DETECTED NOT DETECTED Final   Sapovirus (I, II, IV, and V) NOT DETECTED NOT DETECTED Final    Comment: Performed at [facility], [address] [place], [place] [address]  C difficile quick scan w PCR reflex     Status: None   Collection Time: 03/26/17  5:46 AM  Result Value Ref Range Status   C Diff antigen NEGATIVE NEGATIVE Final   C Diff toxin NEGATIVE NEGATIVE Final   C Diff interpretation No C. difficile detected.  Final  MRSA PCR Screening     Status: None   Collection Time: 03/26/17  3:28 PM  Result Value Ref Range Status   MRSA by PCR NEGATIVE NEGATIVE Final    Comment:        The GeneXpert MRSA Assay (FDA approved for NASAL specimens only), is one component of a comprehensive MRSA colonization surveillance program. It is not intended to diagnose MRSA infection nor to guide or monitor treatment for MRSA infections.       Studies: No results  found.  Scheduled Meds: . chlorhexidine  15 mL Mouth Rinse BID  . enoxaparin (LOVENOX) injection  40 mg Subcutaneous QHS  . hydrocortisone sod succinate (SOLU-CORTEF) inj  50 mg Intravenous Q6H  . linezolid  600 mg Oral Q12H  . mouth rinse  15 mL Mouth Rinse q12n4p  . pantoprazole  80 mg Oral Daily  .  potassium chloride  40 mEq Oral TID    Continuous Infusions: . sodium chloride 50 mL/hr at 03/28/17 0600  . norepinephrine (LEVOPHED) Adult infusion Stopped (03/28/17 0015)  . piperacillin-tazobactam (ZOSYN)  IV Stopped (03/28/17 2527)     LOS: 2 days     Veronica Memos, MD [facility] Pager [phone]  If 7PM-7AM, please contact night-coverage [email or web address] Password [facility] 03/28/2017, 7:13 AM

## 2017-03-28 NOTE — Progress Notes (Signed)
[facility] PCCM PROGRESS NOTE  Date of Admission: 03/26/2017 Date of Consult: 03/26/2017 Referring Provider: Dr. Nevada Crane, [facility] Chief Complaint: Low blood pressure  HPI: 36 yo female with hx of paraplegia in setting of transverse myelitis and MS was in hospital from 02/13/17 to 03/12/17 with sacral pressure ulcer with osteomyelitis.  She was d/c home on IV Abx through PICC line.  Her mother remotes she started getting nausea, vomiting and diarrhea after eating something funny.  She hasn't been each much since.  She had stool for C diff that was negative.  ID was consulted and abx adjusted.  She has required multiple boluses of IV fluid, but blood pressure remains low.  She has significant worsening of her creatinine and increase in her hemoglobin compared to recent admission, and these would be consistent with significant volume depletion.  She had elevated lactic acid and this has trended down.  She was still getting her medications including baclofen, valium benadryl, neurontin, lamictal, and zoloft.  She was started on pressor agents.  Her mother reports her normal blood pressure is 90's to low 100's.  Past Medical History: She  has a past medical history of Bladder stones, Depression, GERD (gastroesophageal reflux disease), Multiple sclerosis ([place]), Paraplegia ([place]), PTSD (post-traumatic stress disorder), and Transverse myelitis ([place]).  Subjective: Still having loose stools.  Denies chest pain.  Had hiccups last night.  Vital signs: BP (!) 112/59   Pulse 73   Temp 97.7 F (36.5 C) (Oral)   Resp 13   Ht 5\' 3"  (1.6 m)   Wt 134 lb 0.6 oz (60.8 kg)   SpO2 98%   BMI 23.74 kg/m   Intake/Output: I/O last 3 completed shifts: In: 3039.8 [I.V.:2739.8; Other:100; IV Piggyback:200] Out: 2150 [Urine:1950; Stool:200]  Physical Exam:  General - pleasant Eyes - pupils reactive ENT - no sinus tenderness, no oral exudate, no LAN Cardiac - regular, no murmur Chest - no wheeze, rales Abd - soft,  urostomy bag in place Ext - 1+ edema Skin - sacral wound Neuro - moves upper extremities Psych - normal mood  Discussion: 35 yo female with hypotension from sepsis and volume depletion in setting of nausea, vomiting, and diarrhea.  Source of sepsis is likely sacral wound and/or viral gastroenteritis.  She could also have relative adrenal insufficiency.  Altered mental status likely related to metabolic derangements and ineffective clearance of her medications in setting of renal insufficiency.  Mental status, renal fx, and hemodynamics improved 12/26.  Assessment/Plan:  Hypotension. - from sepsis, volume depletion, and relative adrenal insufficiency - off pressors - continue IV fluids - can wean off solu cortef over next 3 days >> will change to 50 mg bid 12/27  Sepsis likely from sacral wound. - previous wound cultures grew Proteus, MRSA, MDRO/CRE Acinetobacter - Abx per ID  Hypokalemia, hypomagnesemia, hypophosphatemia. 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 - will resume baclofen, valium, benadryl, neurontin, lamictal >> start at low dose, and titrate up as tolerated to home dose - hold zoloft while on linezolid due to concern for developing serotonin syndrome  Nausea, vomiting, diarrhea. - hold outpt amitiza - hold protonix and see if this improves diarrhea - would avoid zofran in setting of linezolid use due to potential for developing serotonin syndrome  DVT prophylaxis: Lovenox SUP: Protonix Diet: Clear liquid Goals of care: Full code Disposition: Mother is caregiver at home.  Unfortunately, she has shoulder fracture.  Will need to arrange  for extra home health assistance when Le[facility]lee is ready for discharge.  Updated mother at bedside.  PCCM will sign off.  Chesley Mires, MD [facility] Pulmonary/Critical Care 03/28/2017, 8:53 AM Pager:  [phone] After 3pm call: [phone]  FLOW  SHEET  Cultures: GI Panel 12/25 >> negative Blood 12/25 >> Urine 12/25 >> yeast C diff PCR 12/25 >> negative  Antibiotics: Zosyn 12/24 >> Linezolid 12/24 >>  Studies: MR Pelvis 11/17 >> deep soft tissue ulcer extending to the left ischial tuberosity, early osteomyelitis, proctitis CT head 12/25 >> mild parenchymal volume loss  Events: 12/24 Admit  12/25 Start pressors 12/27 off pressors  Lines/tubes: Lt PICC 11/26 >>   Consults: 12/25 ID  Resolved Problems: Lactic acidosis, acute renal failure, non anion gap metabolic acidosis, acute metabolic encephalopathy  Labs: CMP Latest Ref Rng & Units 03/28/2017 03/27/2017 03/27/2017  Glucose 65 - 99 mg/dL 114(H) 236(H) 167(H)  BUN 6 - 20 mg/dL <5(L) 6 8  Creatinine 0.44 - 1.00 mg/dL <0.30(L) 0.38(L) <0.30(L)  Sodium 135 - 145 mmol/L 137 141 144  Potassium 3.5 - 5.1 mmol/L 3.4(L) 2.1(LL) <2.0(LL)  Chloride 101 - 111 mmol/L 106 111 112(H)  CO2 22 - 32 mmol/L 26 23 26   Calcium 8.9 - 10.3 mg/dL 7.3(L) 7.5(L) 7.2(L)  Total Protein 6.5 - 8.1 g/dL - - -  Total Bilirubin 0.3 - 1.2 mg/dL - - -  Alkaline Phos 38 - 126 U/L - - -  AST 15 - 41 U/L - - -  ALT 14 - 54 U/L - - -    CBC Latest Ref Rng & Units 03/28/2017 03/27/2017 03/26/2017  WBC 4.0 - 10.5 K/uL 6.8 13.6(H) 44.0(H)  Hemoglobin 12.0 - 15.0 g/dL 7.0(L) 8.3(L) 12.8  Hematocrit 36.0 - 46.0 % 23.4(L) 27.6(L) 43.2  Platelets 150 - 400 K/uL 131(L) 173 438(H)    CBG (last 3)  Recent Labs    03/27/17 2121  GLUCAP 164*    Imaging: Ct Head Wo Contrast  Result Date: 03/26/2017 CLINICAL DATA:  36 y/o F; history of multiple sclerosis and paraplegia. Possible septic shock. EXAM: CT HEAD WITHOUT CONTRAST TECHNIQUE: Contiguous axial images were obtained from the base of the skull through the vertex without intravenous contrast. COMPARISON:  11/15/2016 MRI of the head.  12/12/2009 CT of the head. FINDINGS: Brain: No evidence of acute infarction, hemorrhage, hydrocephalus,  extra-axial collection or mass lesion/mass effect. Stable mild brain parenchymal volume loss. Vascular: No hyperdense vessel or unexpected calcification. Skull: Normal. Negative for fracture or focal lesion. Sinuses/Orbits: Partial opacification of bilateral maxillary sinuses with retention cysts. Otherwise negative. Other: None. IMPRESSION: 1. No acute intracranial abnormality identified. 2. Stable mild brain parenchymal volume loss. Electronically Signed   By: Kristine Garbe M.D.   On: 03/26/2017 22:06

## 2017-03-28 NOTE — Progress Notes (Signed)
Pt BP 86/74. Attending Dr. Nevada Crane notified/text-paged. Awaiting response.  Notified/paged Dr. Halford Chessman . 500 mL NS bolus ordered. Will continue to monitor.

## 2017-03-29 LAB — BASIC METABOLIC PANEL
Anion gap: 5 (ref 5–15)
BUN: <5 mg/dL — ABNORMAL LOW (ref 6–20)
CALCIUM: 7.6 mg/dL — AB (ref 8.9–10.3)
CO2: 26 mmol/L (ref 22–32)
Chloride: 109 mmol/L (ref 101–111)
GLUCOSE: 104 mg/dL — AB (ref 65–99)
Potassium: 3.4 mmol/L — ABNORMAL LOW (ref 3.5–5.1)
Sodium: 140 mmol/L (ref 135–145)

## 2017-03-29 LAB — CBC WITH DIFFERENTIAL/PLATELET
BASOS PCT: 0 %
Basophils Absolute: 0 10*3/uL (ref 0.0–0.1)
EOS PCT: 0 %
Eosinophils Absolute: 0 10*3/uL (ref 0.0–0.7)
HEMATOCRIT: 26.4 % — AB (ref 36.0–46.0)
Hemoglobin: 8 g/dL — ABNORMAL LOW (ref 12.0–15.0)
LYMPHS ABS: 1.9 10*3/uL (ref 0.7–4.0)
Lymphocytes Relative: 18 %
MCH: 21.1 pg — ABNORMAL LOW (ref 26.0–34.0)
MCHC: 30.3 g/dL (ref 30.0–36.0)
MCV: 69.7 fL — AB (ref 78.0–100.0)
MONOS PCT: 6 %
Monocytes Absolute: 0.6 10*3/uL (ref 0.1–1.0)
NEUTROS ABS: 7.9 10*3/uL — AB (ref 1.7–7.7)
Neutrophils Relative %: 76 %
Platelets: 205 10*3/uL (ref 150–400)
RBC: 3.79 MIL/uL — ABNORMAL LOW (ref 3.87–5.11)
RDW: 22.2 % — AB (ref 11.5–15.5)
WBC: 10.4 10*3/uL (ref 4.0–10.5)

## 2017-03-29 LAB — MAGNESIUM: Magnesium: 2.1 mg/dL (ref 1.7–2.4)

## 2017-03-29 LAB — PHOSPHORUS: Phosphorus: 2.3 mg/dL — ABNORMAL LOW (ref 2.5–4.6)

## 2017-03-29 MED ORDER — CHLORHEXIDINE GLUCONATE CLOTH 2 % EX PADS
6.0000 | MEDICATED_PAD | Freq: Every day | CUTANEOUS | Status: DC
  Administered 2017-03-29 – 2017-03-31 (×3): 6 via TOPICAL

## 2017-03-29 MED ORDER — SODIUM CHLORIDE 0.9% FLUSH
10.0000 mL | Freq: Two times a day (BID) | INTRAVENOUS | Status: DC
  Administered 2017-03-29 (×2): 10 mL

## 2017-03-29 MED ORDER — SODIUM CHLORIDE 0.9% FLUSH: 10.0000 mL | INTRAVENOUS | Status: DC | PRN

## 2017-03-29 MED ORDER — CHLORHEXIDINE GLUCONATE CLOTH 2 % EX PADS
6.0000 | MEDICATED_PAD | Freq: Every day | CUTANEOUS | Status: DC
  Administered 2017-03-31 – 2017-04-01 (×2): 6 via TOPICAL

## 2017-03-29 MED ORDER — SODIUM CHLORIDE 0.9% FLUSH
10.0000 mL | INTRAVENOUS | Status: DC | PRN
Start: 2017-03-29 — End: 2017-04-03
  Administered 2017-04-02 – 2017-04-03 (×4): 10 mL
  Filled 2017-03-29 (×4): qty 40

## 2017-03-29 NOTE — Progress Notes (Signed)
PROGRESS NOTE    [name]  CBJ:[id number] DOB: 1980-09-07 DOA: 03/26/2017 PCP: Lennie Odor, PA-C      Brief Narrative:  Veronica Scovell Myersis a 35 y.o.femalewith medical history significant ofparaplegia, MS, transverse myelitis, recently admitted last month for sacral decubitus with osteomyelitis.Discharged on 12/10 with a PICC line on tigacyclinewith PO levaquin.Presents this time with complaints of diarrhea and vomiting of 1 day duration. Upon presentation wbc 44 k and hypotensive. IV fluid boluses administered.C-diff pcr negative 03/26/17. ID following and recommends to continue antibiotics. Upon arrival to the unit patient unable to maintain MAP and started on pressors post 8L IV fluid NS blouses and 50 grams of albumin infusions. CCM consulted.     Assessment & Plan:  Principal Problem:   Diarrhea Active Problems:   Paraplegia (HCC)   History of transverse myelitis   Constipation   Osteomyelitis, pelvis (HCC)   Decubitus ulcer of left ischial area, stage III (HCC)   MDR Acinetobacter baumannii infection   Sepsis associated hypotension (HCC)   AKI (acute kidney injury) (HCC)   Somnolence   Facial droop   Septic shock Chronic osteomyelitis Off pressors since this morning.  Negative C. difficile.  Presumed source was infection of sacral ulcers by stool.  Improving on Linezolid and Zosyn.  Blood culture still negative, urine culture with yeast only, doubt candidal infection. -Continue linezolid and IV Zosyn -Consult ID, recommendations appreciated   Secondary adrenal insufficiency -Continue Solu-Cortef, same dose for now, taper on Saturday if no pressors -Hold home prednisone  Anemia of chronic disease Stable  Acute kidney injury:  Baseline creatinine 0.3, admission creatinine 1.3, resolved with fluids.  Sacral ulcer, stage IV pressure injury to left ischium, con unhealing Osteomyelitis of the pelvis -WOC -Flexi-Seal  Hypokalemia Repleted,  magnesium normal  Paraplegia Transverse myelitis Multiple sclerosis No active disease  Other medications -Continue baclofen -Continue Valium -Continue gabapentin -Continue feeding supplement -Continue lamotrigine      DVT prophylaxis: Lovenox Code Status: FULL Family Communication: None present, mother was sent to ER this morning Disposition Plan: Transition to SDU today.  If no pressors for 24 hours, transtiion to med surg bed tomorrow.  Likely SNF after discharge, if mother still recovering.    Consultants:   ID  PCCM  Procedures:   None  Antimicrobials:   Linezolid  Zosyn   Cultures: 12/25 Cdiff -- negative 12/25 GI pathoegn panel -- negative 12/25 Blood culture x2 -- NGTD 12/25 Urine cx -- Yeast     Subjective: Feeling okay.  No new fever.  No new confusion, cough, pain.    Still weak.  No more vomiting.  Objective: Vitals:   03/29/17 0830 03/29/17 0900 03/29/17 1000 03/29/17 1120  BP: (!) 106/51 (!) 100/47 (!) 92/45   Pulse: 92 87 85   Resp: 18 14 13    Temp:    97.9 F (36.6 C)  TempSrc:    Oral  SpO2: 99% 100% 99%   Weight:      Height:        Intake/Output Summary (Last 24 hours) at 03/29/2017 1400 Last data filed at 03/29/2017 1607 Gross per 24 hour  Intake 1940.71 ml  Output 1150 ml  Net 790.71 ml   Filed Weights   03/26/17 1451 03/26/17 1600 03/29/17 0337  Weight: 61.2 kg (134 lb 14.7 oz) 60.8 kg (134 lb 0.6 oz) 68.1 kg (150 lb 2.1 oz)    Examination: General appearance: Paraplegic, very illappearing adult female, but awake and in no obvious distress.  HEENT: Anicteric, conjunctiva pink, lids and lashes normal. No nasal deformity, discharge, epistaxis.  Lips dry.   Skin: Warm and dry.  Very pale.  No jaundice.  No suspicious rashes or lesions. Cardiac: Tachycardic, occasional early beats, nl S1-S2, no murmurs appreciated.  Capillary refill is brisk.  JVP not visible.  Nonpitting LE edema.  Radial pulses 2+ and  symmetric. Respiratory: Normal respiratory rate and rhythm.  CTAB without rales or wheezes. Abdomen: Abdomen soft.  No TTP. Somewhat distended. MSK: Chronic paraplegic contractures. Neuro: Awake and alert.  Paraplegic. Speech somewhat dysarthric.    Psych: Sensorium intact and responding to questions, attention normal. Affect blunted.  Judgment and insight appear normal.    Data Reviewed: I have personally reviewed following labs and imaging studies:  CBC: Recent Labs  Lab 03/26/17 0223 03/27/17 0339 03/28/17 0323 03/29/17 0358  WBC 44.0* 13.6* 6.8 10.4  NEUTROABS 39.6*  --   --  7.9*  HGB 12.8 8.3* 7.0* 8.0*  HCT 43.2 27.6* 23.4* 26.4*  MCV 69.5* 69.2* 68.8* 69.7*  PLT 438* 173 131* 938   Basic Metabolic Panel: Recent Labs  Lab 03/26/17 0223 03/27/17 0339 03/27/17 1755 03/28/17 0323 03/29/17 0358  NA 137 144 141 137 140  K 3.9 <2.0* 2.1* 3.4* 3.4*  CL 105 112* 111 106 109  CO2 18* 26 23 26 26   GLUCOSE 154* 167* 236* 114* 104*  BUN 31* 8 6 <5* <5*  CREATININE 1.37* <0.30* 0.38* <0.30* <0.30*  CALCIUM 7.7* 7.2* 7.5* 7.3* 7.6*  MG  --  1.4* 1.9 1.7 2.1  PHOS  --   --  1.0* 2.9 2.3*   GFR: CrCl cannot be calculated (This lab value cannot be used to calculate CrCl because it is not a number: <0.30). Liver Function Tests: Recent Labs  Lab 03/26/17 0223  AST 34  ALT 26  ALKPHOS 153*  BILITOT 0.8  PROT 4.5*  ALBUMIN 2.3*   Recent Labs  Lab 03/26/17 0223  LIPASE 23   No results for input(s): AMMONIA in the last 168 hours. Coagulation Profile: No results for input(s): INR, PROTIME in the last 168 hours. Cardiac Enzymes: No results for input(s): CKTOTAL, CKMB, CKMBINDEX, TROPONINI in the last 168 hours. BNP (last 3 results) No results for input(s): PROBNP in the last 8760 hours. HbA1C: No results for input(s): HGBA1C in the last 72 hours. CBG: Recent Labs  Lab 03/27/17 2121  GLUCAP 164*   Lipid Profile: No results for input(s): CHOL, HDL, LDLCALC,  TRIG, CHOLHDL, LDLDIRECT in the last 72 hours. Thyroid Function Tests: No results for input(s): TSH, T4TOTAL, FREET4, T3FREE, THYROIDAB in the last 72 hours. Anemia Panel: No results for input(s): VITAMINB12, FOLATE, FERRITIN, TIBC, IRON, RETICCTPCT in the last 72 hours. Urine analysis:    Component Value Date/Time   COLORURINE YELLOW 03/26/2017 0223   APPEARANCEUR CLOUDY (A) 03/26/2017 0223   LABSPEC 1.014 03/26/2017 0223   PHURINE 6.0 03/26/2017 0223   GLUCOSEU NEGATIVE 03/26/2017 0223   HGBUR SMALL (A) 03/26/2017 0223   BILIRUBINUR NEGATIVE 03/26/2017 0223   KETONESUR NEGATIVE 03/26/2017 0223   PROTEINUR 30 (A) 03/26/2017 0223   UROBILINOGEN 2.0 (H) 10/29/2014 1627   NITRITE NEGATIVE 03/26/2017 0223   LEUKOCYTESUR TRACE (A) 03/26/2017 0223   Sepsis Labs: @LABRCNTIP (procalcitonin:4,lacticacidven:4)  ) Recent Results (from the past 240 hour(s))  Urine culture     Status: Abnormal   Collection Time: 03/26/17  2:23 AM  Result Value Ref Range Status   Specimen Description URINE, RANDOM  Final  Special Requests NONE  Final   Culture >=100,000 COLONIES/mL YEAST (A)  Final   Report Status 03/27/2017 FINAL  Final  Blood Culture (routine x 2)     Status: None (Preliminary result)   Collection Time: 03/26/17  2:24 AM  Result Value Ref Range Status   Specimen Description BLOOD RIGHT WRIST  Final   Special Requests IN PEDIATRIC BOTTLE Blood Culture adequate volume  Final   Culture   Final    NO GROWTH 2 DAYS Performed at [facility], [place] [address], [place], [place] [address]    Report Status PENDING  Incomplete  Blood Culture (routine x 2)     Status: None (Preliminary result)   Collection Time: 03/26/17  2:26 AM  Result Value Ref Range Status   Specimen Description BLOOD PICC LINE  Final   Special Requests   Final    BOTTLES DRAWN AEROBIC AND ANAEROBIC Blood Culture adequate volume   Culture   Final    NO GROWTH 2 DAYS Performed at [facility], [place]  [address], [place], [place] [address]    Report Status PENDING  Incomplete  Gastrointestinal Panel by PCR , Stool     Status: None   Collection Time: 03/26/17  5:46 AM  Result Value Ref Range Status   Campylobacter species NOT DETECTED NOT DETECTED Final   Plesimonas shigelloides NOT DETECTED NOT DETECTED Final   Salmonella species NOT DETECTED NOT DETECTED Final   Yersinia enterocolitica NOT DETECTED NOT DETECTED Final   Vibrio species NOT DETECTED NOT DETECTED Final   Vibrio cholerae NOT DETECTED NOT DETECTED Final   Enteroaggregative E coli (EAEC) NOT DETECTED NOT DETECTED Final   Enteropathogenic E coli (EPEC) NOT DETECTED NOT DETECTED Final   Enterotoxigenic E coli (ETEC) NOT DETECTED NOT DETECTED Final   Shiga like toxin producing E coli (STEC) NOT DETECTED NOT DETECTED Final   Shigella/Enteroinvasive E coli (EIEC) NOT DETECTED NOT DETECTED Final   Cryptosporidium NOT DETECTED NOT DETECTED Final   Cyclospora cayetanensis NOT DETECTED NOT DETECTED Final   Entamoeba histolytica NOT DETECTED NOT DETECTED Final   Giardia lamblia NOT DETECTED NOT DETECTED Final   Adenovirus F40/41 NOT DETECTED NOT DETECTED Final   Astrovirus NOT DETECTED NOT DETECTED Final   Norovirus GI/GII NOT DETECTED NOT DETECTED Final   Rotavirus A NOT DETECTED NOT DETECTED Final   Sapovirus (I, II, IV, and V) NOT DETECTED NOT DETECTED Final    Comment: Performed at [facility], [address] [place], [place] [address]  C difficile quick scan w PCR reflex     Status: None   Collection Time: 03/26/17  5:46 AM  Result Value Ref Range Status   C Diff antigen NEGATIVE NEGATIVE Final   C Diff toxin NEGATIVE NEGATIVE Final   C Diff interpretation No C. difficile detected.  Final  MRSA PCR Screening     Status: None   Collection Time: 03/26/17  3:28 PM  Result Value Ref Range Status   MRSA by PCR NEGATIVE NEGATIVE Final    Comment:        The GeneXpert MRSA Assay (FDA approved for NASAL specimens only), is  one component of a comprehensive MRSA colonization surveillance program. It is not intended to diagnose MRSA infection nor to guide or monitor treatment for MRSA infections.          Radiology Studies: No results found.      Scheduled Meds: . baclofen  5 mg Oral Q8H  .  chlorhexidine  15 mL Mouth Rinse BID  . Chlorhexidine Gluconate Cloth  6 each Topical Daily  . Chlorhexidine Gluconate Cloth  6 each Topical Daily  . diazepam  5 mg Oral Q8H  . enoxaparin (LOVENOX) injection  40 mg Subcutaneous QHS  . feeding supplement  1 Container Oral BID BM  . gabapentin  300 mg Oral Q8H  . hydrocortisone sod succinate (SOLU-CORTEF) inj  50 mg Intravenous Q12H  . lamoTRIgine  50 mg Oral Daily  . linezolid  600 mg Oral Q12H  . mouth rinse  15 mL Mouth Rinse q12n4p  . protein supplement  8 oz Oral Daily  . sodium chloride flush  10-40 mL Intracatheter Q12H   Continuous Infusions: . sodium chloride 75 mL/hr at 03/29/17 0600  . norepinephrine (LEVOPHED) Adult infusion Stopped (03/29/17 0700)  . phenylephrine (NEO-SYNEPHRINE) Adult infusion Stopped (03/29/17 0355)  . piperacillin-tazobactam (ZOSYN)  IV 3.375 g (03/29/17 1013)     LOS: 3 days    Time spent: 40  minutes    Edwin Dada, MD [facility] 03/29/2017, 2:00 PM     Pager [phone] --- please page though AMION:  [email or web address] If 7PM-7AM, please contact night-coverage

## 2017-03-29 NOTE — Progress Notes (Signed)
   03/29/17 1400  Clinical Encounter Type  Visited With Family  Visit Type Initial;Psychological support;Spiritual support;Critical Care  Referral From Nurse  Consult/Referral To Chaplain  Spiritual Encounters  Spiritual Needs Other (Comment) (Clothing )  Stress Factors  Patient Stress Factors Not reviewed  Family Stress Factors Other (Comment) (Clothing )   The nurse stated that the patient's mother, who is at the bedside, was in need of clean clothing.  I gathered together clean clothing and provided those for the patient's mother.   Please, contact Spiritual Care for further assistance.  [place] M.Div.

## 2017-03-29 NOTE — Progress Notes (Signed)
CSW consult- "Possible Neglect"  CSW will follow up with APS, and continue following for discharge needs.   Kathrin Greathouse, Latanya Presser, MSW Clinical Social Worker  [phone] 03/29/2017  5:12 PM

## 2017-03-29 NOTE — Progress Notes (Signed)
Pharmacy Antibiotic Note  [name] is a 36 y.o. female re-admitted on 03/26/2017 with sepsis.  She was recently discharge on Tigecycline and Levaquin for pelvis osteomyelitis, ischial decubitus ulcer.  Tigecycline and Levaquin were change to Zyvox per MD due to GI side effects.  Pharmacy has been consulted for zosyn dosing.  Today, 03/29/2017: Day #4 inpatient abx Afebrile WBC 10.4, Hgb low/stable at 8 (baseline ~10), Plt improved to 205.  Continue to monitor for linezolid induced myelosuppression. SCr < 0.3  Plan:  Continue Zosyn 3.375g IV Q8H infused over 4hrs.  Continue Zyvox 600 mg PO q12h   Follow up renal fxn, culture results, and clinical course.   Height: 5\' 3"  (160 cm) Weight: 150 lb 2.1 oz (68.1 kg) IBW/kg (Calculated) : 52.4  Temp (24hrs), Avg:97.8 F (36.6 C), Min:97.7 F (36.5 C), Max:98.1 F (36.7 C)  Recent Labs  Lab 03/26/17 0223 03/26/17 0237 03/26/17 0513 03/26/17 0546 03/26/17 0801 03/27/17 0339 03/27/17 1755 03/28/17 0323 03/29/17 0358  WBC 44.0*  --   --   --   --  13.6*  --  6.8 10.4  CREATININE 1.37*  --   --   --   --  <0.30* 0.38* <0.30* <0.30*  LATICACIDVEN  --  4.07* 2.50* 2.3* 1.3  --   --   --   --     CrCl cannot be calculated (This lab value cannot be used to calculate CrCl because it is not a number: <0.30).    Allergies  Allergen Reactions  . Iodine Anaphylaxis  . Latex Anaphylaxis  . Polymyxin B Hives    Pt had hives in right arm with infusion of polymyxin on November 26th, 2018  . Shellfish-Derived Products Anaphylaxis    Reaction unknown  . Vancomycin Anaphylaxis  . Aspirin Other (See Comments)    Reaction unknown  . Strawberry Extract Hives and Rash   Antimicrobials previous admission:  11/19 Ceftaz >> 11/21, 11/26 >> 11/26, 11/30 >> 12/3 11/21 merrem >> 11/26 11/19 Dapto >> 12/6 11/26 Levaquin >> 11/30, 12/3 >> 11/26 Polymixin B >> 11/26 x1, stopped during infusion, wheal x1 on forearm 11/26 Minocycline >>  11/28 12/3 tygacil >> 12/3 Levaquin >>   Antimicrobials this admission: 12/25 zosyn >>  12/25 zyvox  >>   Dose adjustments this admission:   Microbiology results: 12/25 BCx: ngtd 12/25 UCx:  >100k yeast 12/25 MRSA PCR: negative 12/25 Cdiff: neg/neg 12/25 GI Panel PCR: none detected  Thank you for allowing pharmacy to be a part of this patient's care.  Gretta Arab PharmD, BCPS Pager [phone] 03/29/2017 8:38 AM

## 2017-03-30 DIAGNOSIS — D61818 Other pancytopenia: Secondary | ICD-10-CM

## 2017-03-30 DIAGNOSIS — D696 Thrombocytopenia, unspecified: Secondary | ICD-10-CM

## 2017-03-30 LAB — CBC
HCT: 22.3 % — ABNORMAL LOW (ref 36.0–46.0)
Hemoglobin: 6.6 g/dL — CL (ref 12.0–15.0)
MCH: 20.8 pg — AB (ref 26.0–34.0)
MCHC: 29.6 g/dL — AB (ref 30.0–36.0)
MCV: 70.3 fL — ABNORMAL LOW (ref 78.0–100.0)
PLATELETS: 135 10*3/uL — AB (ref 150–400)
RBC: 3.17 MIL/uL — AB (ref 3.87–5.11)
RDW: 22.1 % — ABNORMAL HIGH (ref 11.5–15.5)
WBC: 3.8 10*3/uL — ABNORMAL LOW (ref 4.0–10.5)

## 2017-03-30 LAB — BASIC METABOLIC PANEL
Anion gap: 3 — ABNORMAL LOW (ref 5–15)
BUN: 5 mg/dL — AB (ref 6–20)
CALCIUM: 7.3 mg/dL — AB (ref 8.9–10.3)
CO2: 28 mmol/L (ref 22–32)
Chloride: 110 mmol/L (ref 101–111)
Glucose, Bld: 81 mg/dL (ref 65–99)
Potassium: 2.9 mmol/L — ABNORMAL LOW (ref 3.5–5.1)
SODIUM: 141 mmol/L (ref 135–145)

## 2017-03-30 LAB — IRON AND TIBC
IRON: 98 ug/dL (ref 28–170)
Saturation Ratios: 59 % — ABNORMAL HIGH (ref 10.4–31.8)
TIBC: 167 ug/dL — AB (ref 250–450)
UIBC: 69 ug/dL

## 2017-03-30 LAB — PREPARE RBC (CROSSMATCH)

## 2017-03-30 LAB — HEMOGLOBIN AND HEMATOCRIT, BLOOD
HCT: 29.5 % — ABNORMAL LOW (ref 36.0–46.0)
Hemoglobin: 9.3 g/dL — ABNORMAL LOW (ref 12.0–15.0)

## 2017-03-30 LAB — FERRITIN: Ferritin: 14 ng/mL (ref 11–307)

## 2017-03-30 MED ORDER — POTASSIUM CHLORIDE 20 MEQ PO PACK: 40.0000 meq | PACK | Freq: Two times a day (BID) | ORAL | Status: DC

## 2017-03-30 MED ORDER — SERTRALINE HCL 100 MG PO TABS
100.0000 mg | ORAL_TABLET | Freq: Every day | ORAL | Status: DC
  Administered 2017-03-30: 100 mg via ORAL
  Filled 2017-03-30: qty 1

## 2017-03-30 MED ORDER — SODIUM CHLORIDE 0.9 % IV SOLN
INTRAVENOUS | Status: DC
  Administered 2017-03-30: 19:00:00 via INTRAVENOUS

## 2017-03-30 MED ORDER — SODIUM CHLORIDE 0.9 % IV SOLN: Freq: Once | INTRAVENOUS | Status: DC

## 2017-03-30 MED ORDER — SODIUM CHLORIDE 0.9 % IV SOLN
500.0000 mg | INTRAVENOUS | Status: DC
  Administered 2017-03-30 – 2017-04-01 (×3): 500 mg via INTRAVENOUS
  Filled 2017-03-30 (×4): qty 10

## 2017-03-30 MED ORDER — POTASSIUM CHLORIDE 20 MEQ/15ML (10%) PO SOLN
40.0000 meq | Freq: Two times a day (BID) | ORAL | Status: AC
  Administered 2017-03-30 – 2017-04-01 (×4): 40 meq via ORAL
  Filled 2017-03-30 (×4): qty 30

## 2017-03-30 NOTE — Progress Notes (Signed)
[facility] for Infectious Disease    Date of Admission:  03/26/2017   Total days of antibiotics 6         Day 6 piptazo        Day 6 linezolid           ID: [name] is a 36 y.o. female with polymicrobial sacral wound/early osteo of MRSA, XDR -acinetobacter, and proteus from 11/16, readmitted < 7 d for sepsis due to fecal contamination of wound Principal Problem:   Diarrhea Active Problems:   Paraplegia ([place])   History of transverse myelitis   Constipation   Osteomyelitis, pelvis (HCC)   Decubitus ulcer of left ischial area, stage III (HCC)   MDR Acinetobacter baumannii infection   Sepsis associated hypotension (HCC)   AKI (acute kidney injury) ([place])   Somnolence   Facial droop    Subjective: Afebrile, she still feels swollen from steroids, she had mild aspiration with her eggs this morning  24hr events: labs reveal drops in all cell counts  Medications:  . baclofen  5 mg Oral Q8H  . chlorhexidine  15 mL Mouth Rinse BID  . Chlorhexidine Gluconate Cloth  6 each Topical Daily  . Chlorhexidine Gluconate Cloth  6 each Topical Daily  . diazepam  5 mg Oral Q8H  . enoxaparin (LOVENOX) injection  40 mg Subcutaneous QHS  . feeding supplement  1 Container Oral BID BM  . gabapentin  300 mg Oral Q8H  . hydrocortisone sod succinate (SOLU-CORTEF) inj  50 mg Intravenous Q12H  . lamoTRIgine  50 mg Oral Daily  . mouth rinse  15 mL Mouth Rinse q12n4p  . protein supplement  8 oz Oral Daily    Objective: Vital signs in last 24 hours: Temp:  [97.8 F (36.6 C)-98.5 F (36.9 C)] 97.9 F (36.6 C) (12/29 0821) Pulse Rate:  [80-103] 90 (12/29 0600) Resp:  [10-29] 21 (12/29 0600) BP: (92-118)/(44-85) 118/76 (12/29 0600) SpO2:  [93 %-100 %] 100 % (12/29 0600) Physical Exam  Constitutional:  oriented to person, place, and time. appears well-developed and well-nourished. No distress.  HENT: [place]/AT, PERRLA, no scleral icterus diffuse facial rash lacy macular rash to cheeks,  forehead, and chin but spares eyes. Mouth/Throat: Oropharynx is clear and moist. No oropharyngeal exudate. Poor dentition,discoloration of teeth, peridontal disease Cardiovascular: Normal rate, regular rhythm and normal heart sounds. Exam reveals no gallop and no friction rub.  No murmur heard.  Pulmonary/Chest: Effort normal and breath sounds normal. No respiratory distress.  has no wheezes.  Neck = supple, no nuchal rigidity Abdominal: Soft. Bowel sounds are normal.  exhibits no distension. There is no tenderness.  Lymphadenopathy: no cervical adenopathy. No axillary adenopathy Neurological: alert and oriented to person, place, and time. Flaccid lower extremities Skin: Skin is warm and dry. No rash noted. No erythema.  Ext: nonpitting edema to arms  Psychiatric: a normal mood and affect.  behavior is normal.    Lab Results Recent Labs    03/29/17 0358 03/30/17 0500  WBC 10.4 3.8*  HGB 8.0* 6.6*  HCT 26.4* 22.3*  NA 140 141  K 3.4* 2.9*  CL 109 110  CO2 26 28  BUN <5* 5*  CREATININE <0.30* <0.30*    Microbiology: 12/25 blood cx ngtd   Studies/Results: No results found.   Assessment/Plan: Sepsis = currently on piptazo and linezolid, though it appears having bone marrow suppression from linezolid. We will discontinue linezolid and switch to daptomycin  Polymicrobial sacral wound /early  osteo = she has been treated for this process since 11/19. The challenges include that the deep tissue culture from surgical debridement included MRSA, pan sensitive proteus, but also XDR-acinetobacter. She was discharged on tigecycline plus levofloxacin on 12/11 but readmitted on 12/25 with severe sepsis including profuse diarrhea/n/v, which has now improved since stopping her previous regimen. She is now on piptazo and anti-MRSA anti-infective. This regimen, however, does not include treatment for acinetobacter. Our options for treatment are few for her XDR, since she also developed infusion  reaction to polymixin.  For now will postpone further treatment for XDR acinetobacter since she appears improving from her current bout of sepsis. One potential option maybe eravacycline, which new anti-infective, but it also has nausea side effect profile. Would like to have patient recover from recent N/V/D episode.  Will get baseline CK for daptomycin monitoring  Sacral wound = Continue with local wound care  Pancytopenia = anticipate to rebound back since stopping linezolid.  Anemia = likely from linezolid since no other source of bleeding but recommend to do hemolysis labs, may need transfusion  Hx of XDR acinetobacter= room needs to be UV disinfected/terminally cleaned if she is moving out of the unit  Dr Tommy Medal to see patient over the next few days. I will return back on Jan 2nd.  [facility] Infectious Diseases Cell: [phone] Pager: [phone]  03/30/2017, 9:21 AM

## 2017-03-30 NOTE — Progress Notes (Signed)
CRITICAL VALUE ALERT  Critical Value:  HGB 6.6  Date & Time Notied:  03/30/17  0740  Provider Notified: Loleta Books  Orders Received/Actions taken: Waiting for response

## 2017-03-30 NOTE — Progress Notes (Addendum)
PROGRESS NOTE    [name]  DQQ:[id number] DOB: 09/17/1980 DOA: 03/26/2017 PCP: Lennie Odor, PA-C      Brief Narrative:  Veronica Exantus Myersis a 36 y.o.femalewith medical history significant ofparaplegia, MS, transverse myelitis, recently admitted last month for sacral decubitus with osteomyelitis.Discharged on 12/10 with a PICC line on tigacyclinewith PO levaquin.Presents this time with complaints of diarrhea and vomiting of 1 day duration. Upon presentation wbc 44 k and hypotensive. IV fluid boluses administered.C-diff pcr negative 03/26/17. ID following and recommends to continue antibiotics. Upon arrival to the unit patient unable to maintain MAP and started on pressors post 8L IV fluid NS blouses and 50 grams of albumin infusions. CCM consulted.     Assessment & Plan:  Principal Problem:   Diarrhea Active Problems:   Paraplegia (HCC)   History of transverse myelitis   Constipation   Osteomyelitis, pelvis (HCC)   Decubitus ulcer of left ischial area, stage III (HCC)   MDR Acinetobacter baumannii infection   Sepsis associated hypotension (HCC)   AKI (acute kidney injury) (HCC)   Somnolence   Facial droop   Septic shock Chronic osteomyelitis Off pressors since yesterday.  Negative C. difficile.  Presumed source was infection of sacral ulcers by stool.  Improving on Linezolid and Zosyn.  Blood culture still negative, urine culture with yeast only, doubt candidal infection. -Continue IV Zosyn -Consult ID, recommendations appreciated -Will discuss alternatives to Linezolid given worsening pancytopenia   Secondary adrenal insufficiency -Continue Solu-Cortef, start tapering dose slightly today -Hold home prednisone  Anemia of chronic disease Microcytic anemia Pancytopenia Hgb baseline 8-10.  Worsened now, also with thrombocytopenia (new) and leukopenia (also new).  Concern for bone marrow suppression, vs dilution.   -Check iron studies -Discuss linezolid  alternatives with ID  Acute kidney injury:  Baseline creatinine 0.3, admission creatinine 1.3, resolved with fluids.  Sacral ulcer, stage IV pressure injury to left ischium, con unhealing Osteomyelitis of the pelvis -Wound care -Flexi-Seal  Hypokalemia Repleting, magnesium normal  Paraplegia Transverse myelitis Multiple sclerosis No active disease  Other medications -Continue baclofen -Continue Valium -Continue gabapentin -Continue feeding supplement -Continue lamotrigine -Resume sertraline, titrate back up to 100 TID     DVT prophylaxis: Lovenox Code Status: FULL Family Communication: Mother Disposition Plan: Transfuse today, if H/H improved after, transfer to med surg bed.   Still needs 2-3 more days admission.  ULtimate antibiotic regimen unclear.  Likely SNF after discharge, if mother still recovering.    Consultants:   ID  PCCM  Procedures:   None  Antimicrobials:   Tigecycline 12/03 >>12/24  Levaquin 12/03 >>12/24  Linezolid 12/24 >>  Zosyn 12/24 >>  Cultures: 12/25 Cdiff -- negative 12/25 GI pathoegn panel -- negative 12/25 Blood culture x2 -- NGTD 12/25 Urine cx -- Yeast     Subjective: Feeling okay.  Weak.  Bloated belly.  Arm swelling.  No new fever.  No new confusion, cough, pain.      No more vomiting.  Objective: Vitals:   03/30/17 0300 03/30/17 0400 03/30/17 0500 03/30/17 0600  BP: (!) 103/55 105/63 103/65 118/76  Pulse: 96 100 84 90  Resp: 17 (!) 29 14 (!) 21  Temp:  98.2 F (36.8 C)    TempSrc:      SpO2: 95% 100% 99% 100%  Weight:      Height:        Intake/Output Summary (Last 24 hours) at 03/30/2017 0818 Last data filed at 03/30/2017 0600 Gross per 24 hour  Intake 2557.5  ml  Output 1750 ml  Net 807.5 ml   Filed Weights   03/26/17 1451 03/26/17 1600 03/29/17 0337  Weight: 61.2 kg (134 lb 14.7 oz) 60.8 kg (134 lb 0.6 oz) 68.1 kg (150 lb 2.1 oz)    Examination: General appearance: Paraplegic, very  illappearing adult female, but awake and in no obvious distress.  Listless, but oriented, interactive.  Pleasant. HEENT: Anicteric, conjunctiva pink, lids and lashes normal. No nasal deformity, discharge, epistaxis.  Lips dry.   Skin: Warm and dry.  Very pale.  No jaundice.  No suspicious rashes or lesions. Cardiac: RRR, nl S1-S2, no murmurs appreciated.  1+ pitting LE edema.  Radial pulses 2+ and symmetric. Respiratory: Normal respiratory rate and rhythm.  CTAB without rales or wheezes. Abdomen: Abdomen soft.  No TTP. Somewhat distended, bloated. MSK: Chronic paraplegic contractures. Neuro: Awake and alert.  Paraplegic. Speech somewhat dysarthric.    Psych: Sensorium intact and responding to questions, attention normal. Affect blunted.  Judgment and insight appear normal.    Data Reviewed: I have personally reviewed following labs and imaging studies:  CBC: Recent Labs  Lab 03/26/17 0223 03/27/17 0339 03/28/17 0323 03/29/17 0358 03/30/17 0500  WBC 44.0* 13.6* 6.8 10.4 3.8*  NEUTROABS 39.6*  --   --  7.9*  --   HGB 12.8 8.3* 7.0* 8.0* 6.6*  HCT 43.2 27.6* 23.4* 26.4* 22.3*  MCV 69.5* 69.2* 68.8* 69.7* 70.3*  PLT 438* 173 131* 205 381*   Basic Metabolic Panel: Recent Labs  Lab 03/27/17 0339 03/27/17 1755 03/28/17 0323 03/29/17 0358 03/30/17 0500  NA 144 141 137 140 141  K <2.0* 2.1* 3.4* 3.4* 2.9*  CL 112* 111 106 109 110  CO2 26 23 26 26 28   GLUCOSE 167* 236* 114* 104* 81  BUN 8 6 <5* <5* 5*  CREATININE <0.30* 0.38* <0.30* <0.30* <0.30*  CALCIUM 7.2* 7.5* 7.3* 7.6* 7.3*  MG 1.4* 1.9 1.7 2.1  --   PHOS  --  1.0* 2.9 2.3*  --    GFR: CrCl cannot be calculated (This lab value cannot be used to calculate CrCl because it is not a number: <0.30). Liver Function Tests: Recent Labs  Lab 03/26/17 0223  AST 34  ALT 26  ALKPHOS 153*  BILITOT 0.8  PROT 4.5*  ALBUMIN 2.3*   Recent Labs  Lab 03/26/17 0223  LIPASE 23   No results for input(s): AMMONIA in the last  168 hours. Coagulation Profile: No results for input(s): INR, PROTIME in the last 168 hours. Cardiac Enzymes: No results for input(s): CKTOTAL, CKMB, CKMBINDEX, TROPONINI in the last 168 hours. BNP (last 3 results) No results for input(s): PROBNP in the last 8760 hours. HbA1C: No results for input(s): HGBA1C in the last 72 hours. CBG: Recent Labs  Lab 03/27/17 2121  GLUCAP 164*   Lipid Profile: No results for input(s): CHOL, HDL, LDLCALC, TRIG, CHOLHDL, LDLDIRECT in the last 72 hours. Thyroid Function Tests: No results for input(s): TSH, T4TOTAL, FREET4, T3FREE, THYROIDAB in the last 72 hours. Anemia Panel: No results for input(s): VITAMINB12, FOLATE, FERRITIN, TIBC, IRON, RETICCTPCT in the last 72 hours. Urine analysis:    Component Value Date/Time   COLORURINE YELLOW 03/26/2017 0223   APPEARANCEUR CLOUDY (A) 03/26/2017 0223   LABSPEC 1.014 03/26/2017 0223   PHURINE 6.0 03/26/2017 0223   GLUCOSEU NEGATIVE 03/26/2017 0223   HGBUR SMALL (A) 03/26/2017 0223   BILIRUBINUR NEGATIVE 03/26/2017 0223   KETONESUR NEGATIVE 03/26/2017 0223   PROTEINUR 30 (A) 03/26/2017  3007   UROBILINOGEN 2.0 (H) 10/29/2014 1627   NITRITE NEGATIVE 03/26/2017 0223   LEUKOCYTESUR TRACE (A) 03/26/2017 0223   Sepsis Labs: @LABRCNTIP (procalcitonin:4,lacticacidven:4)  ) Recent Results (from the past 240 hour(s))  Urine culture     Status: Abnormal   Collection Time: 03/26/17  2:23 AM  Result Value Ref Range Status   Specimen Description URINE, RANDOM  Final   Special Requests NONE  Final   Culture >=100,000 COLONIES/mL YEAST (A)  Final   Report Status 03/27/2017 FINAL  Final  Blood Culture (routine x 2)     Status: None (Preliminary result)   Collection Time: 03/26/17  2:24 AM  Result Value Ref Range Status   Specimen Description BLOOD RIGHT WRIST  Final   Special Requests IN PEDIATRIC BOTTLE Blood Culture adequate volume  Final   Culture   Final    NO GROWTH 3 DAYS Performed at [facility], [place] [address], [place], [place] [address]    Report Status PENDING  Incomplete  Blood Culture (routine x 2)     Status: None (Preliminary result)   Collection Time: 03/26/17  2:26 AM  Result Value Ref Range Status   Specimen Description BLOOD PICC LINE  Final   Special Requests   Final    BOTTLES DRAWN AEROBIC AND ANAEROBIC Blood Culture adequate volume   Culture   Final    NO GROWTH 3 DAYS Performed at [facility], [place] [address] [place], [place] [address]    Report Status PENDING  Incomplete  Gastrointestinal Panel by PCR , Stool     Status: None   Collection Time: 03/26/17  5:46 AM  Result Value Ref Range Status   Campylobacter species NOT DETECTED NOT DETECTED Final   Plesimonas shigelloides NOT DETECTED NOT DETECTED Final   Salmonella species NOT DETECTED NOT DETECTED Final   Yersinia enterocolitica NOT DETECTED NOT DETECTED Final   Vibrio species NOT DETECTED NOT DETECTED Final   Vibrio cholerae NOT DETECTED NOT DETECTED Final   Enteroaggregative E coli (EAEC) NOT DETECTED NOT DETECTED Final   Enteropathogenic E coli (EPEC) NOT DETECTED NOT DETECTED Final   Enterotoxigenic E coli (ETEC) NOT DETECTED NOT DETECTED Final   Shiga like toxin producing E coli (STEC) NOT DETECTED NOT DETECTED Final   Shigella/Enteroinvasive E coli (EIEC) NOT DETECTED NOT DETECTED Final   Cryptosporidium NOT DETECTED NOT DETECTED Final   Cyclospora cayetanensis NOT DETECTED NOT DETECTED Final   Entamoeba histolytica NOT DETECTED NOT DETECTED Final   Giardia lamblia NOT DETECTED NOT DETECTED Final   Adenovirus F40/41 NOT DETECTED NOT DETECTED Final   Astrovirus NOT DETECTED NOT DETECTED Final   Norovirus GI/GII NOT DETECTED NOT DETECTED Final   Rotavirus A NOT DETECTED NOT DETECTED Final   Sapovirus (I, II, IV, and V) NOT DETECTED NOT DETECTED Final    Comment: Performed at [facility], [place] [place],  [address]  C difficile quick scan w PCR reflex      Status: None   Collection Time: 03/26/17  5:46 AM  Result Value Ref Range Status   C Diff antigen NEGATIVE NEGATIVE Final   C Diff toxin NEGATIVE NEGATIVE Final   C Diff interpretation No C. difficile detected.  Final  MRSA PCR Screening     Status: None   Collection Time: 03/26/17  3:28 PM  Result Value Ref Range Status   MRSA by PCR NEGATIVE NEGATIVE Final    Comment:  The GeneXpert MRSA Assay (FDA approved for NASAL specimens only), is one component of a comprehensive MRSA colonization surveillance program. It is not intended to diagnose MRSA infection nor to guide or monitor treatment for MRSA infections.          Radiology Studies: No results found.      Scheduled Meds: . baclofen  5 mg Oral Q8H  . chlorhexidine  15 mL Mouth Rinse BID  . Chlorhexidine Gluconate Cloth  6 each Topical Daily  . Chlorhexidine Gluconate Cloth  6 each Topical Daily  . diazepam  5 mg Oral Q8H  . enoxaparin (LOVENOX) injection  40 mg Subcutaneous QHS  . feeding supplement  1 Container Oral BID BM  . gabapentin  300 mg Oral Q8H  . hydrocortisone sod succinate (SOLU-CORTEF) inj  50 mg Intravenous Q12H  . lamoTRIgine  50 mg Oral Daily  . linezolid  600 mg Oral Q12H  . mouth rinse  15 mL Mouth Rinse q12n4p  . protein supplement  8 oz Oral Daily   Continuous Infusions: . piperacillin-tazobactam (ZOSYN)  IV Stopped (03/30/17 0735)     LOS: 4 days    Time spent: 40  minutes    Edwin Dada, MD [facility] 03/30/2017, 8:18 AM     Pager [phone] --- please page though AMION:  [email or web address] If 7PM-7AM, please contact night-coverage

## 2017-03-30 NOTE — Plan of Care (Signed)
  Elimination: Will not experience complications related to bowel motility 03/30/2017 1856 - Progressing by Dorene Sorrow, RN   Nutrition: Adequate nutrition will be maintained 03/30/2017 1856 - Progressing by Dorene Sorrow, RN

## 2017-03-30 NOTE — Progress Notes (Addendum)
Pharmacy Antibiotic Note  [name] is a 36 y.o. female re-admitted on 03/26/2017 with sepsis.  She was recently discharge on Tigecycline and Levaquin for pelvis osteomyelitis, ischial decubitus ulcer.  Tigecycline and Levaquin were change to Zyvox per MD due to GI side effects.  CBC decreasing, Zyvox stopped 12/29 and will switch to daptomycin.  Pharmacy has been following for zosyn dosing.  Now also consulted to dose daptomycin.  CK 10 (low) on 12/10. Will use as baseline.  Plan:  Daptomycin 500 mg (~8 mg/kg) IV q24h.  Monitor CK weekly.  Continue Zosyn 3.375g IV Q8H infused over 4hrs.   Height: 5\' 3"  (160 cm) Weight: 150 lb 2.1 oz (68.1 kg) IBW/kg (Calculated) : 52.4  Temp (24hrs), Avg:98.1 F (36.7 C), Min:97.8 F (36.6 C), Max:98.5 F (36.9 C)  Recent Labs  Lab 03/26/17 0223 03/26/17 0237 03/26/17 0513 03/26/17 0546 03/26/17 0801 03/27/17 0339 03/27/17 1755 03/28/17 0323 03/29/17 0358 03/30/17 0500  WBC 44.0*  --   --   --   --  13.6*  --  6.8 10.4 3.8*  CREATININE 1.37*  --   --   --   --  <0.30* 0.38* <0.30* <0.30* <0.30*  LATICACIDVEN  --  4.07* 2.50* 2.3* 1.3  --   --   --   --   --     CrCl cannot be calculated (This lab value cannot be used to calculate CrCl because it is not a number: <0.30).    Allergies  Allergen Reactions  . Iodine Anaphylaxis  . Latex Anaphylaxis  . Polymyxin B Hives    Pt had hives in right arm with infusion of polymyxin on November 26th, 2018  . Shellfish-Derived Products Anaphylaxis    Reaction unknown  . Vancomycin Anaphylaxis  . Aspirin Other (See Comments)    Reaction unknown  . Strawberry Extract Hives and Rash   Antimicrobials previous admission:  11/19 Ceftaz >> 11/21, 11/26 >> 11/26, 11/30 >> 12/3 11/21 merrem >> 11/26 11/19 Dapto >> 12/6 11/26 Levaquin >> 11/30, 12/3 >> 11/26 Polymixin B >> 11/26 x1, stopped during infusion, wheal x1 on forearm 11/26 Minocycline >> 11/28 12/3 tygacil >> 12/24 12/3 Levaquin  >> 12/24  Antimicrobials this admission: 12/25 zosyn >>  12/25 zyvox >> 12/29 12/29 daptomycin >>  Dose adjustments this admission:  Microbiology results: 02/18/17  Sacral ulcer fluid: pan sensitive proteus, MRSA, and MDRO/CRE acinetobacter  12/25 BCx: ngtd 12/25 UCx: >100k yeast  12/25 MRSA PCR: negative 12/25 C. Diff PCR: negative 12/25: GI Panel: none detected  Thank you for allowing pharmacy to be a part of this patient's care.  Hershal Coria, PharmD, BCPS Pager: [phone] 03/30/2017 9:24 AM

## 2017-03-31 DIAGNOSIS — A4902 Methicillin resistant Staphylococcus aureus infection, unspecified site: Secondary | ICD-10-CM

## 2017-03-31 DIAGNOSIS — N179 Acute kidney failure, unspecified: Secondary | ICD-10-CM

## 2017-03-31 LAB — CULTURE, BLOOD (ROUTINE X 2)
CULTURE: NO GROWTH
Culture: NO GROWTH
SPECIAL REQUESTS: ADEQUATE
Special Requests: ADEQUATE

## 2017-03-31 LAB — BASIC METABOLIC PANEL
ANION GAP: 4 — AB (ref 5–15)
BUN: 7 mg/dL (ref 6–20)
CALCIUM: 7.6 mg/dL — AB (ref 8.9–10.3)
CHLORIDE: 111 mmol/L (ref 101–111)
CO2: 27 mmol/L (ref 22–32)
Glucose, Bld: 93 mg/dL (ref 65–99)
Potassium: 3.4 mmol/L — ABNORMAL LOW (ref 3.5–5.1)
SODIUM: 142 mmol/L (ref 135–145)

## 2017-03-31 LAB — CK: CK TOTAL: 22 U/L — AB (ref 38–234)

## 2017-03-31 LAB — CBC
HCT: 26.9 % — ABNORMAL LOW (ref 36.0–46.0)
HEMOGLOBIN: 8.5 g/dL — AB (ref 12.0–15.0)
MCH: 23.5 pg — ABNORMAL LOW (ref 26.0–34.0)
MCHC: 31.6 g/dL (ref 30.0–36.0)
MCV: 74.5 fL — ABNORMAL LOW (ref 78.0–100.0)
Platelets: 138 10*3/uL — ABNORMAL LOW (ref 150–400)
RBC: 3.61 MIL/uL — ABNORMAL LOW (ref 3.87–5.11)
RDW: 23.3 % — AB (ref 11.5–15.5)
WBC: 5.8 10*3/uL (ref 4.0–10.5)

## 2017-03-31 LAB — OCCULT BLOOD X 1 CARD TO LAB, STOOL: FECAL OCCULT BLD: NEGATIVE

## 2017-03-31 MED ORDER — SODIUM CHLORIDE 0.9 % IV SOLN
2.0000 g | Freq: Once | INTRAVENOUS | Status: AC
  Administered 2017-03-31: 2 g via INTRAVENOUS
  Filled 2017-03-31: qty 20

## 2017-03-31 MED ORDER — FERROUS SULFATE 325 (65 FE) MG PO TABS
325.0000 mg | ORAL_TABLET | Freq: Every day | ORAL | Status: DC
  Administered 2017-03-31 – 2017-04-03 (×4): 325 mg via ORAL
  Filled 2017-03-31 (×4): qty 1

## 2017-03-31 MED ORDER — ONDANSETRON HCL 4 MG/2ML IJ SOLN
4.0000 mg | Freq: Four times a day (QID) | INTRAMUSCULAR | Status: DC | PRN
  Administered 2017-03-31: 4 mg via INTRAVENOUS
  Filled 2017-03-31: qty 2

## 2017-03-31 MED ORDER — SERTRALINE HCL 100 MG PO TABS
100.0000 mg | ORAL_TABLET | Freq: Two times a day (BID) | ORAL | Status: DC
  Administered 2017-03-31 – 2017-04-03 (×6): 100 mg via ORAL
  Filled 2017-03-31 (×6): qty 1

## 2017-03-31 NOTE — Progress Notes (Signed)
PROGRESS NOTE    KAIULANI SITTON  ZOX:[id number] DOB: 06-May-1980 DOA: 03/26/2017 PCP: Lennie Odor, PA-C      Brief Narrative:  Jakeria Caissie Myersis a 36 y.o.femalewith medical history significant ofparaplegia, MS, transverse myelitis, recently admitted last month for sacral decubitus with osteomyelitis.Discharged on 12/10 with a PICC line on tigacyclinewith PO levaquin.Presents this time with complaints of diarrhea and vomiting of 1 day duration. Upon presentation wbc 44 k and hypotensive. IV fluid boluses administered.C-diff pcr negative 03/26/17. ID following and recommends to continue antibiotics. Upon arrival to the unit patient unable to maintain MAP and started on pressors post 8L IV fluid NS blouses and 50 grams of albumin infusions. CCM consulted.     Assessment & Plan:  Principal Problem:   Diarrhea Active Problems:   Paraplegia (HCC)   History of transverse myelitis   Constipation   Osteomyelitis, pelvis (HCC)   Decubitus ulcer of left ischial area, stage III (HCC)   MDR Acinetobacter baumannii infection   Sepsis associated hypotension (HCC)   AKI (acute kidney injury) (HCC)   Somnolence   Facial droop   Septic shock Chronic osteomyelitis Presumed source was infection of sacral ulcers by stool.  Negative C. difficile.  Improved on Linezolid and Zosyn --> dapto/Zosyn.  Blood culture still negative, urine culture with yeast only, doubt candidal infection.  Off pressors for two days now.  BP normally 81-191 systolic only. -Continue IV Zosyn, daptomycin -Consult ID, recommendations appreciated -Stop fluids given dyspnea, very puffy   Secondary adrenal insufficiency Empiric steroids given initially, but patient states she has not actually been on daily prednisone for years.   -Stop steroids -Monitor BP  Anemia of chronic disease Microcytic anemia Pancytopenia Hgb baseline 8-10.  Improved post-transfusion 1U PRBCs.  Pancytopenia improved, linezolid  stopped.  Iron studies show deficiency.  Reports PCOS and metrorrhagia, denies rectal bleeding.  States her GI (Dr. Oletta Lamas) checked a negative FOBT within last few months.  She used to be on iron, couldn't tolerate due to constipation.   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     DVT prophylaxis: Lovenox Code Status: FULL Family Communication: Mother Disposition Plan: Maintain SDU care today, given labile BP.  If BP stable in 47-829 systolic range without symptoms, can transfer to med surg bed in 24-48 hours.  Ultimate antibiotic regimen unclear.  Likely SNF after discharge (mother is primary caregiver and is recovering from shoulder condition).    Consultants:   ID  PCCM  Procedures:   None  Antimicrobials:   Tigecycline 12/03 >>12/24  Levaquin 12/03 >>12/24  Linezolid 12/24 >> 12/29  Zosyn 12/24 >>  Daptomycin 12/29 >>    Cultures: 12/25 Cdiff -- negative 12/25 GI pathoegn panel -- negative 12/25 Blood culture x2 -- NGTD 12/25 Urine cx -- Yeast     Subjective: Feeling okay.  Still very weak. Some vomiting and coughing/aspiration yesterday, but feels over that.  A lot of puffiness/swelling.  No dizziness, new confusion, cough, pain.        Objective: Vitals:   03/31/17 1100 03/31/17 1200 03/31/17 1300 03/31/17 1400  BP: 130/70 124/69 134/75 (!) 144/82  Pulse: (!) 102 (!) 103  95  Resp: 18 18 (!) 24 12  Temp:  98.3 F  (36.8 C)    TempSrc:  Oral    SpO2: 92% 93%  95%  Weight:      Height:        Intake/Output Summary (Last 24 hours) at 03/31/2017 1546 Last data filed at 03/31/2017 0700 Gross per 24 hour  Intake 1450 ml  Output 1180 ml  Net 270 ml   Filed Weights   03/26/17 1451 03/26/17 1600 03/29/17 0337  Weight: 61.2 kg (134 lb 14.7 oz) 60.8 kg (134 lb 0.6 oz) 68.1 kg (150 lb 2.1 oz)    Examination: General appearance: Paraplegic, very illappearing adult female, but awake and in no obvious distress.  Listless, but oriented, interactive.  Pleasant. HEENT: Anicteric, conjunctiva pink, lids and lashes normal. No nasal deformity, discharge, epistaxis.  Lips dry.     Skin: Warm and dry.  Very pale.  No jaundice.  No suspicious rashes or lesions, scattered ecchymosis. Cardiac: RRR, nl S1-S2, no murmurs appreciated.  Puffy arms.  2+ pitting LE edema.  Radial pulses 2+ and symmetric. Respiratory: Normal respiratory rate and rhythm.  CTAB without rales or wheezes.Diminished thoughout. Abdomen: Abdomen soft.  No TTP. Somewhat distended, bloated, no pain. MSK: Chronic paraplegic contractures. Neuro: Awake and alert.  Paraplegic. Speech somewhat dysarthric.    Psych: Sensorium intact and responding to questions, attention normal. Affect blunted.  Judgment and insight appear normal.    Data Reviewed: I have personally reviewed following labs and imaging studies:  CBC: Recent Labs  Lab 03/26/17 0223 03/27/17 0339 03/28/17 0323 03/29/17 0358 03/30/17 0500 03/30/17 1740 03/31/17 0420  WBC 44.0* 13.6* 6.8 10.4 3.8*  --  5.8  NEUTROABS 39.6*  --   --  7.9*  --   --   --   HGB 12.8 8.3* 7.0* 8.0* 6.6* 9.3* 8.5*  HCT 43.2 27.6* 23.4* 26.4* 22.3* 29.5* 26.9*  MCV 69.5* 69.2* 68.8* 69.7* 70.3*  --  74.5*  PLT 438* 173 131* 205 135*  --  622*   Basic Metabolic Panel: Recent Labs  Lab 03/27/17 0339 03/27/17 1755 03/28/17 0323 03/29/17 0358 03/30/17 0500 03/31/17 0420  NA 144 141 137 140 141  142  K <2.0* 2.1* 3.4* 3.4* 2.9* 3.4*  CL 112* 111 106 109 110 111  CO2 26 23 26 26 28 27   GLUCOSE 167* 236* 114* 104* 81 93  BUN 8 6 <5* <5* 5* 7  CREATININE <0.30* 0.38* <0.30* <0.30* <0.30* <0.30*  CALCIUM 7.2* 7.5* 7.3* 7.6* 7.3* 7.6*  MG 1.4* 1.9 1.7 2.1  --   --   PHOS  --  1.0* 2.9 2.3*  --   --    GFR: CrCl cannot be calculated (This lab value cannot be used to calculate CrCl because it is not a number: <0.30). Liver Function Tests: Recent Labs  Lab 03/26/17 0223  AST 34  ALT 26  ALKPHOS 153*  BILITOT 0.8  PROT 4.5*  ALBUMIN 2.3*   Recent Labs  Lab 03/26/17 0223  LIPASE 23   No results for input(s): AMMONIA in the last 168 hours. Coagulation Profile: No results for input(s): INR, PROTIME in the last 168 hours. Cardiac Enzymes: Recent Labs  Lab 03/31/17 0420  CKTOTAL 22*   BNP (last 3 results) No results for input(s): PROBNP in the last 8760 hours. HbA1C: No results for input(s): HGBA1C in the last 72  hours. CBG: Recent Labs  Lab 03/27/17 2121  GLUCAP 164*   Lipid Profile: No results for input(s): CHOL, HDL, LDLCALC, TRIG, CHOLHDL, LDLDIRECT in the last 72 hours. Thyroid Function Tests: No results for input(s): TSH, T4TOTAL, FREET4, T3FREE, THYROIDAB in the last 72 hours. Anemia Panel: Recent Labs    03/30/17 0900  FERRITIN 14  TIBC 167*  IRON 98   Urine analysis:    Component Value Date/Time   COLORURINE YELLOW 03/26/2017 0223   APPEARANCEUR CLOUDY (A) 03/26/2017 0223   LABSPEC 1.014 03/26/2017 0223   PHURINE 6.0 03/26/2017 0223   GLUCOSEU NEGATIVE 03/26/2017 0223   HGBUR SMALL (A) 03/26/2017 0223   BILIRUBINUR NEGATIVE 03/26/2017 0223   KETONESUR NEGATIVE 03/26/2017 0223   PROTEINUR 30 (A) 03/26/2017 0223   UROBILINOGEN 2.0 (H) 10/29/2014 1627   NITRITE NEGATIVE 03/26/2017 0223   LEUKOCYTESUR TRACE (A) 03/26/2017 0223   Sepsis Labs: @LABRCNTIP (procalcitonin:4,lacticacidven:4)  ) Recent Results (from the past 240 hour(s))  Urine  culture     Status: Abnormal   Collection Time: 03/26/17  2:23 AM  Result Value Ref Range Status   Specimen Description URINE, RANDOM  Final   Special Requests NONE  Final   Culture >=100,000 COLONIES/mL YEAST (A)  Final   Report Status 03/27/2017 FINAL  Final  Blood Culture (routine x 2)     Status: None   Collection Time: 03/26/17  2:24 AM  Result Value Ref Range Status   Specimen Description BLOOD RIGHT WRIST  Final   Special Requests IN PEDIATRIC BOTTLE Blood Culture adequate volume  Final   Culture   Final    NO GROWTH 5 DAYS Performed at [facility], [place] [address], [place], [place] [address]    Report Status 03/31/2017 FINAL  Final  Blood Culture (routine x 2)     Status: None   Collection Time: 03/26/17  2:26 AM  Result Value Ref Range Status   Specimen Description BLOOD PICC LINE  Final   Special Requests   Final    BOTTLES DRAWN AEROBIC AND ANAEROBIC Blood Culture adequate volume   Culture   Final    NO GROWTH 5 DAYS Performed at [facility], [place] [address] [place], [place] [address]    Report Status 03/31/2017 FINAL  Final  Gastrointestinal Panel by PCR , Stool     Status: None   Collection Time: 03/26/17  5:46 AM  Result Value Ref Range Status   Campylobacter species NOT DETECTED NOT DETECTED Final   Plesimonas shigelloides NOT DETECTED NOT DETECTED Final   Salmonella species NOT DETECTED NOT DETECTED Final   Yersinia enterocolitica NOT DETECTED NOT DETECTED Final   Vibrio species NOT DETECTED NOT DETECTED Final   Vibrio cholerae NOT DETECTED NOT DETECTED Final   Enteroaggregative E coli (EAEC) NOT DETECTED NOT DETECTED Final   Enteropathogenic E coli (EPEC) NOT DETECTED NOT DETECTED Final   Enterotoxigenic E coli (ETEC) NOT DETECTED NOT DETECTED Final   Shiga like toxin producing E coli (STEC) NOT DETECTED NOT DETECTED Final   Shigella/Enteroinvasive E coli (EIEC) NOT DETECTED NOT DETECTED Final   Cryptosporidium NOT DETECTED NOT DETECTED Final    Cyclospora cayetanensis NOT DETECTED NOT DETECTED Final   Entamoeba histolytica NOT DETECTED NOT DETECTED Final   Giardia lamblia NOT DETECTED NOT DETECTED Final   Adenovirus F40/41 NOT DETECTED NOT DETECTED Final   Astrovirus NOT DETECTED NOT DETECTED Final   Norovirus GI/GII NOT DETECTED NOT DETECTED Final   Rotavirus A NOT  DETECTED NOT DETECTED Final   Sapovirus (I, II, IV, and V) NOT DETECTED NOT DETECTED Final    Comment: Performed at [facility], [address] [place], [place] [address]  C difficile quick scan w PCR reflex     Status: None   Collection Time: 03/26/17  5:46 AM  Result Value Ref Range Status   C Diff antigen NEGATIVE NEGATIVE Final   C Diff toxin NEGATIVE NEGATIVE Final   C Diff interpretation No C. difficile detected.  Final  MRSA PCR Screening     Status: None   Collection Time: 03/26/17  3:28 PM  Result Value Ref Range Status   MRSA by PCR NEGATIVE NEGATIVE Final    Comment:        The GeneXpert MRSA Assay (FDA approved for NASAL specimens only), is one component of a comprehensive MRSA colonization surveillance program. It is not intended to diagnose MRSA infection nor to guide or monitor treatment for MRSA infections.          Radiology Studies: No results found.      Scheduled Meds: . baclofen  5 mg Oral Q8H  . chlorhexidine  15 mL Mouth Rinse BID  . Chlorhexidine Gluconate Cloth  6 each Topical Daily  . Chlorhexidine Gluconate Cloth  6 each Topical Daily  . diazepam  5 mg Oral Q8H  . enoxaparin (LOVENOX) injection  40 mg Subcutaneous QHS  . feeding supplement  1 Container Oral BID BM  . ferrous sulfate  325 mg Oral Q breakfast  . gabapentin  300 mg Oral Q8H  . hydrocortisone sod succinate (SOLU-CORTEF) inj  50 mg Intravenous Q12H  . lamoTRIgine  50 mg Oral Daily  . mouth rinse  15 mL Mouth Rinse q12n4p  . potassium chloride  40 mEq Oral BID  . protein supplement  8 oz Oral Daily  . sertraline  100 mg Oral QHS    Continuous Infusions: . sodium chloride Stopped (03/31/17 1300)  . DAPTOmycin (CUBICIN)  IV Stopped (03/31/17 1035)  . piperacillin-tazobactam (ZOSYN)  IV Stopped (03/31/17 1408)     LOS: 5 days    Time spent: 30  minutes    Edwin Dada, MD [facility] 03/31/2017, 3:46 PM     Pager [phone] --- please page though AMION:  [email or web address] If 7PM-7AM, please contact night-coverage

## 2017-03-31 NOTE — Progress Notes (Signed)
Had discussion with Dr. Tommy Medal, agree we need to restart treatment for pelvic osteo/XDR Acinetobacter.  Options conceivably are: 1. Restart Tigecycline  2. Trial inpatient desensitization to Polymixin 3. Explore obtaining eravacycline  My impression is that #1 is the most feasible, although the downside is obviously that Tygacil caused severe N/V/D despite ondansetron before, and presumably would again.  Could she tolerate if started again?      In the meantime, had a long discussion with patient and mother.  They have a fairly fixed belief that (A) Medicaid only pays for inferior SNF care, and (B) inferior SNF care directly led to her paralysis 14 years ago and also directly to the death of a family member.  I tried to reframe SNF, but they are obviously terrified of this possibility.  LTAC may be another potential option, given she still has a rectal tube.  I have consulted SW and CM for beginning to investigate discharge disposition, considering anything from LTAC to home with additional help in home and [facility] RN for OPAT.  Obtaining buy in from patient and mother for any option other than staying in the hospital will be difficult.  ALSO, in discussion the limited options presented with XDR osteomyelitis of the pelvis, the family brought up the issue of colostomy again.  They seem to think this option is off the table,  although I do not see why as she tolerated general anesthesia during her last hospitalization without issue.  They describe vaguely a medical encounter with a surgeon about a colostomy when they were told it was too dangerous, but cannot remember if this was 2 months ago or 2 years ago.  They claim that they were told she might die under anesthesia, and so they said no.  All I can find is that they were seen on 02/22/2016 in the context of her first pelvic/ischial osteo by Dr. Redmond Pulling who offered colostomy but they declined because they thought the wound would heal alone. Likely the issue  of colostomy should be revisited.

## 2017-03-31 NOTE — Progress Notes (Signed)
Subjective: No new complaints   Antibiotics:  Anti-infectives (From admission, onward)   Start     Dose/Rate Route Frequency Ordered Stop   03/30/17 1000  DAPTOmycin (CUBICIN) 500 mg in sodium chloride 0.9 % IVPB     500 mg 220 mL/hr over 30 Minutes Intravenous Every 24 hours 03/30/17 0921     03/26/17 1000  piperacillin-tazobactam (ZOSYN) IVPB 3.375 g     3.375 g 12.5 mL/hr over 240 Minutes Intravenous Every 8 hours 03/26/17 0402     03/26/17 0345  linezolid (ZYVOX) tablet 600 mg  Status:  Discontinued     600 mg Oral Every 12 hours 03/26/17 0330 03/30/17 0829   03/26/17 0315  piperacillin-tazobactam (ZOSYN) IVPB 3.375 g     3.375 g 100 mL/hr over 30 Minutes Intravenous  Once 03/26/17 0314 03/26/17 0412      Medications: Scheduled Meds: . baclofen  5 mg Oral Q8H  . chlorhexidine  15 mL Mouth Rinse BID  . Chlorhexidine Gluconate Cloth  6 each Topical Daily  . Chlorhexidine Gluconate Cloth  6 each Topical Daily  . diazepam  5 mg Oral Q8H  . enoxaparin (LOVENOX) injection  40 mg Subcutaneous QHS  . feeding supplement  1 Container Oral BID BM  . ferrous sulfate  325 mg Oral Q breakfast  . gabapentin  300 mg Oral Q8H  . lamoTRIgine  50 mg Oral Daily  . mouth rinse  15 mL Mouth Rinse q12n4p  . potassium chloride  40 mEq Oral BID  . protein supplement  8 oz Oral Daily  . sertraline  100 mg Oral QHS   Continuous Infusions: . DAPTOmycin (CUBICIN)  IV Stopped (03/31/17 1035)  . piperacillin-tazobactam (ZOSYN)  IV Stopped (03/31/17 1408)   PRN Meds:.diphenhydrAMINE, fluticasone, loratadine, sodium chloride flush    Objective: Weight change:   Intake/Output Summary (Last 24 hours) at 03/31/2017 1610 Last data filed at 03/31/2017 0700 Gross per 24 hour  Intake 1450 ml  Output 1180 ml  Net 270 ml   Blood pressure (!) 144/82, pulse 95, temperature 98.3 F (36.8 C), temperature source Oral, resp. rate 12, height 5\' 3"  (1.6 m), weight 150 lb 2.1 oz (68.1 kg),  SpO2 95 %. Temp:  [98 F (36.7 C)-99.6 F (37.6 C)] 98.3 F (36.8 C) (12/30 1200) Pulse Rate:  [93-108] 95 (12/30 1400) Resp:  [12-28] 12 (12/30 1400) BP: (74-150)/(50-88) 144/82 (12/30 1400) SpO2:  [91 %-98 %] 95 % (12/30 1400)  Physical Exam: General: Alert and awake, oriented x3, not in any acute distress. HEENT: anicteric sclera, EOMI CVS regular rate, normal r Chest:  no wheezing, resp distress Abdomen: soft  nondistended Wounds: not examined Neuro: no new focal deficits  CBC:  CBC Latest Ref Rng & Units 03/31/2017 03/30/2017 03/30/2017  WBC 4.0 - 10.5 K/uL 5.8 - 3.8(L)  Hemoglobin 12.0 - 15.0 g/dL 8.5(L) 9.3(L) 6.6(LL)  Hematocrit 36.0 - 46.0 % 26.9(L) 29.5(L) 22.3(L)  Platelets 150 - 400 K/uL 138(L) - 135(L)      BMET Recent Labs    03/30/17 0500 03/31/17 0420  NA 141 142  K 2.9* 3.4*  CL 110 111  CO2 28 27  GLUCOSE 81 93  BUN 5* 7  CREATININE <0.30* <0.30*  CALCIUM 7.3* 7.6*     Liver Panel  No results for input(s): PROT, ALBUMIN, AST, ALT, ALKPHOS, BILITOT, BILIDIR, IBILI in the last 72 hours.     Sedimentation Rate No results for input(s): ESRSEDRATE in  the last 72 hours. C-Reactive Protein No results for input(s): CRP in the last 72 hours.  Micro Results: Recent Results (from the past 720 hour(s))  Urine culture     Status: Abnormal   Collection Time: 03/26/17  2:23 AM  Result Value Ref Range Status   Specimen Description URINE, RANDOM  Final   Special Requests NONE  Final   Culture >=100,000 COLONIES/mL YEAST (A)  Final   Report Status 03/27/2017 FINAL  Final  Blood Culture (routine x 2)     Status: None   Collection Time: 03/26/17  2:24 AM  Result Value Ref Range Status   Specimen Description BLOOD RIGHT WRIST  Final   Special Requests IN PEDIATRIC BOTTLE Blood Culture adequate volume  Final   Culture   Final    NO GROWTH 5 DAYS Performed at [facility], [address], [place], [place] [address]    Report Status  03/31/2017 FINAL  Final  Blood Culture (routine x 2)     Status: None   Collection Time: 03/26/17  2:26 AM  Result Value Ref Range Status   Specimen Description BLOOD PICC LINE  Final   Special Requests   Final    BOTTLES DRAWN AEROBIC AND ANAEROBIC Blood Culture adequate volume   Culture   Final    NO GROWTH 5 DAYS Performed at [facility], [place] [address], [place], [place] [address]    Report Status 03/31/2017 FINAL  Final  Gastrointestinal Panel by PCR , Stool     Status: None   Collection Time: 03/26/17  5:46 AM  Result Value Ref Range Status   Campylobacter species NOT DETECTED NOT DETECTED Final   Plesimonas shigelloides NOT DETECTED NOT DETECTED Final   Salmonella species NOT DETECTED NOT DETECTED Final   Yersinia enterocolitica NOT DETECTED NOT DETECTED Final   Vibrio species NOT DETECTED NOT DETECTED Final   Vibrio cholerae NOT DETECTED NOT DETECTED Final   Enteroaggregative E coli (EAEC) NOT DETECTED NOT DETECTED Final   Enteropathogenic E coli (EPEC) NOT DETECTED NOT DETECTED Final   Enterotoxigenic E coli (ETEC) NOT DETECTED NOT DETECTED Final   Shiga like toxin producing E coli (STEC) NOT DETECTED NOT DETECTED Final   Shigella/Enteroinvasive E coli (EIEC) NOT DETECTED NOT DETECTED Final   Cryptosporidium NOT DETECTED NOT DETECTED Final   Cyclospora cayetanensis NOT DETECTED NOT DETECTED Final   Entamoeba histolytica NOT DETECTED NOT DETECTED Final   Giardia lamblia NOT DETECTED NOT DETECTED Final   Adenovirus F40/41 NOT DETECTED NOT DETECTED Final   Astrovirus NOT DETECTED NOT DETECTED Final   Norovirus GI/GII NOT DETECTED NOT DETECTED Final   Rotavirus A NOT DETECTED NOT DETECTED Final   Sapovirus (I, II, IV, and V) NOT DETECTED NOT DETECTED Final    Comment: Performed at [facility], [address] [place], [place] [address]  C difficile quick scan w PCR reflex     Status: None   Collection Time: 03/26/17  5:46 AM  Result Value Ref Range Status     C Diff antigen NEGATIVE NEGATIVE Final   C Diff toxin NEGATIVE NEGATIVE Final   C Diff interpretation No C. difficile detected.  Final  MRSA PCR Screening     Status: None   Collection Time: 03/26/17  3:28 PM  Result Value Ref Range Status   MRSA by PCR NEGATIVE NEGATIVE Final    Comment:        The GeneXpert MRSA Assay (FDA approved for NASAL  specimens only), is one component of a comprehensive MRSA colonization surveillance program. It is not intended to diagnose MRSA infection nor to guide or monitor treatment for MRSA infections.     Studies/Results: No results found.    Assessment/Plan:  INTERVAL HISTORY: pt feeling a bit better   Principal Problem:   Diarrhea Active Problems:   Paraplegia (HCC)   History of transverse myelitis   Constipation   Osteomyelitis, pelvis (HCC)   Decubitus ulcer of left ischial area, stage III (HCC)   MDR Acinetobacter baumannii infection   Sepsis associated hypotension (HCC)   AKI (acute kidney injury) ([place])   Somnolence   Facial droop    [name] is a 36 y.o. 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 who has been admitted on 03/26/17 with severer sepsis, profuse diarrhea/n/v, currently on daptomycin and zosyn   #1 Episode of sepsis: could be partly due to N,V, D + superimposed infection of her ulcer due to fecal contamination  Continue daptomycin and zosyn for now  #2 Sacral osteomyelitis: will consider reinstitute tygacil and levaquin vs going to eravacycline and levaquin  If we can acquire the former. She will need to go to SNF vs [facility] as Mom has broken arm now  #3 TTpenia: resolving off of zyvox.       LOS: 5 days   Alcide Evener 03/31/2017, 4:10 PM

## 2017-03-31 NOTE — Plan of Care (Signed)
  Clinical Measurements: Respiratory complications will improve 03/31/2017 0323 - Completed/Met by Benny Lennert, RN

## 2017-03-31 NOTE — Progress Notes (Signed)
Pt and mother in tears and verbally expressing they do not want pt to be sent to SNF like she was "14 years ago to die". Concerns that pt will have to be started on IV abx that she does not tolerate well and will continue to decline like she did when she was discharged from recent admission. Pt mom voiced that she will not be able to take care of her at home for 6 weeks due to broken arm. Pt mother also adamant about pt not being sent to another "bad nursing facility where they wont watch her closely while on the antibiotics and will let her sit in her poop for 2 hours." Pt concerned that "medicaid will not cover any of the good nursing facilities" and pt "wont receive the care or monitoring that she needs". Pt stated she wishes to stay in the hospital for the duration of the IV abx.  RN provided emotional support and encouraged the pt to voice concerns with physicians.

## 2017-04-01 DIAGNOSIS — D696 Thrombocytopenia, unspecified: Secondary | ICD-10-CM

## 2017-04-01 DIAGNOSIS — D638 Anemia in other chronic diseases classified elsewhere: Secondary | ICD-10-CM

## 2017-04-01 DIAGNOSIS — I9589 Other hypotension: Secondary | ICD-10-CM

## 2017-04-01 DIAGNOSIS — E861 Hypovolemia: Secondary | ICD-10-CM

## 2017-04-01 LAB — BASIC METABOLIC PANEL
Anion gap: 3 — ABNORMAL LOW (ref 5–15)
BUN: 8 mg/dL (ref 6–20)
CALCIUM: 7.6 mg/dL — AB (ref 8.9–10.3)
CO2: 28 mmol/L (ref 22–32)
Chloride: 110 mmol/L (ref 101–111)
Glucose, Bld: 81 mg/dL (ref 65–99)
Potassium: 4.2 mmol/L (ref 3.5–5.1)
SODIUM: 141 mmol/L (ref 135–145)

## 2017-04-01 LAB — TYPE AND SCREEN
ABO/RH(D): O POS
Antibody Screen: NEGATIVE
Unit division: 0

## 2017-04-01 LAB — BPAM RBC
BLOOD PRODUCT EXPIRATION DATE: 201901262359
ISSUE DATE / TIME: 201812291158
UNIT TYPE AND RH: 5100

## 2017-04-01 LAB — CBC
HCT: 26.7 % — ABNORMAL LOW (ref 36.0–46.0)
Hemoglobin: 8 g/dL — ABNORMAL LOW (ref 12.0–15.0)
MCH: 22.5 pg — ABNORMAL LOW (ref 26.0–34.0)
MCHC: 30 g/dL (ref 30.0–36.0)
MCV: 75 fL — ABNORMAL LOW (ref 78.0–100.0)
Platelets: 146 10*3/uL — ABNORMAL LOW (ref 150–400)
RBC: 3.56 MIL/uL — ABNORMAL LOW (ref 3.87–5.11)
RDW: 24.1 % — AB (ref 11.5–15.5)
WBC: 6.1 10*3/uL (ref 4.0–10.5)

## 2017-04-01 MED ORDER — ONDANSETRON HCL 4 MG/2ML IJ SOLN
4.0000 mg | Freq: Two times a day (BID) | INTRAMUSCULAR | Status: DC
  Administered 2017-04-01 – 2017-04-03 (×5): 4 mg via INTRAVENOUS
  Filled 2017-04-01 (×5): qty 2

## 2017-04-01 MED ORDER — SODIUM CHLORIDE 0.9 % IV SOLN
1.0000 mg/kg | Freq: Two times a day (BID) | INTRAVENOUS | Status: DC
  Administered 2017-04-01 – 2017-04-03 (×5): 68 mg via INTRAVENOUS
  Filled 2017-04-01 (×5): qty 6.8

## 2017-04-01 MED ORDER — KETOROLAC TROMETHAMINE 15 MG/ML IJ SOLN
15.0000 mg | Freq: Three times a day (TID) | INTRAMUSCULAR | Status: DC | PRN
  Administered 2017-04-01: 15 mg via INTRAVENOUS
  Filled 2017-04-01 (×2): qty 1

## 2017-04-01 NOTE — Evaluation (Addendum)
Physical Therapy Evaluation Patient Details Name: [name] MRN: [id number] DOB: Apr 09, 1980 Today's Date: 04/01/2017   History of Present Illness  36 yo female admiited with diarrhea, sepsis. Hx of MS, transverse myelitis, paraplegia, sacral/ischial decubitis.   Clinical Impression  On eval, pt required Max assist for bed mobility. Pt sat EOB for ~3-4 minutes with Mod assist for static sitting balance. Pt c/o back pain during session. Pt fatigues fairly easily and c/o some dizziness with sitting as well. Both pt and daughter upset about possible need for placement after hospital stay. Will follow on a trial basis (1-2x/week) to work on sitting balance and UE strengthening during hospital stay.     Follow Up Recommendations SNF;[facility]ACH    Equipment Recommendations  None recommended by PT    Recommendations for Other Services       Precautions / Restrictions Precautions Precautions: Fall Precaution Comments: paraplegia;flexiseal Restrictions Weight Bearing Restrictions: No      Mobility  Bed Mobility Overal bed mobility: Needs Assistance Bed Mobility: Supine to Sit;Sit to Supine;Rolling Rolling: Mod assist   Supine to sit: Max assist;HOB elevated Sit to supine: Max assist;HOB elevated   General bed mobility comments: Assist for trunk and bil LEs. Pt was able to partially roll with use of bedrails; assistance needed to complete full roll.   Transfers                 General transfer comment: NT-pt unable  Ambulation/Gait                Stairs            Wheelchair Mobility    Modified Rankin (Stroke Patients Only)       Balance Overall balance assessment: Needs assistance Sitting-balance support: Bilateral upper extremity supported;Feet supported Sitting balance-Leahy Scale: Poor Sitting balance - Comments: Pt sat EOB for ~3-4 mintues with Mod assist. Pt able to stabilize on fully extended UEs positioned behind her for brief periods.   Postural control: Left lateral lean                                   Pertinent Vitals/Pain Pain Assessment: Faces Faces Pain Scale: Hurts even more Pain Location: back Pain Descriptors / Indicators: Aching;Sore Pain Intervention(s): Limited activity within patient's tolerance;Repositioned    Home Living Family/patient expects to be discharged to:: Unsure Living Arrangements: Parent Available Help at Discharge: Family;Available 24 hours/day Type of Home: House Home Access: Ramped entrance     Home Layout: One level Home Equipment: Bedside commode;Shower seat;Grab bars - toilet;Grab bars - tub/shower;Hand held shower head;Wheelchair - power;Hospital bed      Prior Function Level of Independence: Needs assistance   Gait / Transfers Assistance Needed: mother transfers pt to w/c only if they have an appointment that day, otherwise pt stays in bed.  ADL's / Homemaking Assistance Needed: mother assists with ADL at bed level        Hand Dominance        Extremity/Trunk Assessment   Upper Extremity Assessment Upper Extremity Assessment: RUE deficits/detail;LUE deficits/detail RUE Deficits / Details: Strength ~4/5. Good grip RUE Coordination: decreased fine motor;decreased gross motor LUE Deficits / Details: Strength ~3+/5. Fair grip LUE Coordination: decreased gross motor;decreased fine motor    Lower Extremity Assessment Lower Extremity Assessment: LLE deficits/detail;RLE deficits/detail RLE Deficits / Details: 0/5 RLE Coordination: decreased gross motor;decreased fine motor LLE Deficits / Details: 0/5 LLE Coordination:  decreased fine motor;decreased gross motor       Communication   Communication: No difficulties  Cognition Arousal/Alertness: Awake/alert Behavior During Therapy: WFL for tasks assessed/performed Overall Cognitive Status: Within Functional Limits for tasks assessed                                 General Comments: pt  tearful at times. both pt and mother not happy about possible placement plans      General Comments      Exercises     Assessment/Plan    PT Assessment Patient needs continued PT services(on trial basis)  PT Problem List Decreased strength;Decreased mobility;Decreased activity tolerance;Decreased balance;Pain;Decreased knowledge of use of DME       PT Treatment Interventions Functional mobility training;Balance training;Therapeutic activities;Therapeutic exercise;Patient/family education    PT Goals (Current goals can be found in the Care Plan section)  Acute Rehab PT Goals Patient Stated Goal: return home PT Goal Formulation: With patient/family Time For Goal Achievement: 04/15/17 Potential to Achieve Goals: Poor    Frequency Min 1X/week   Barriers to discharge        Co-evaluation               AM-PAC PT "6 Clicks" Daily Activity  Outcome Measure Difficulty turning over in bed (including adjusting bedclothes, sheets and blankets)?: Unable Difficulty moving from lying on back to sitting on the side of the bed? : Unable Difficulty sitting down on and standing up from a chair with arms (e.g., wheelchair, bedside commode, etc,.)?: Unable Help needed moving to and from a bed to chair (including a wheelchair)?: Total Help needed walking in hospital room?: Total Help needed climbing 3-5 steps with a railing? : Total 6 Click Score: 6    End of Session   Activity Tolerance: Patient limited by pain(Limited by dizziness) Patient left: in bed;with call bell/phone within reach;with family/visitor present;with nursing/sitter in room   PT Visit Diagnosis: Other abnormalities of gait and mobility (R26.89);Other symptoms and signs involving the nervous system (R29.898)    Time: 1411-1446 PT Time Calculation (min) (ACUTE ONLY): 35 min   Charges:   PT Evaluation $PT Eval Moderate Complexity: 1 Mod PT Treatments $Therapeutic Activity: 8-22 mins   PT G Codes:           Weston Anna, MPT Pager: [phone]

## 2017-04-01 NOTE — Progress Notes (Signed)
Date: April 01, 2017 Rhonda Davis, BSN, RN3, CCM [phone] Chart and notes review for patient progress and needs. Will follow for case management and discharge needs. Next review date: [id number] 

## 2017-04-01 NOTE — Progress Notes (Signed)
[facility] PROGRESS NOTE  ROBIE OATS KKX:[id number] DOB: [birth year] DOA: 03/26/2017  PCP: Lennie Odor, PA-C  Brief History/Interval Summary: 36 year old Caucasian female with a past medical history of paraplegia, multiple sclerosis, transverse myelitis recently admitted last month for sacral decubitus with osteomyelitis.  She was discharged on 12/10 with a PICC line.  She was discharged on tigecycline and oral Levaquin.  Presented with complains of nausea vomiting diarrhea.  Patient had leukocytosis and noted to be hypotensive.  She was given fluid boluses.  C. difficile PCR was negative.  ID was consulted.  Patient was stabilized.  And then transferred to the floor.  Reason for Visit: Osteomyelitis with septic shock  Consultants: Infectious disease  Procedures: None  Antibiotics:  Tigecycline 12/03 >>12/24  Levaquin 12/03 >>12/24  Linezolid 12/24 >> 12/29  Zosyn 12/24 >>  Daptomycin 12/29 >>   Subjective/Interval History: Patient states that she feels quite well.  Continues to have loose stools.  Denies any nausea vomiting.  No chest pain or shortness of breath.  ROS: Denies any headaches  Objective:  Vital Signs  Vitals:   04/01/17 0411 04/01/17 0740 04/01/17 0800 04/01/17 1143  BP:   115/70 123/86  Pulse:   (!) 104 (!) 101  Resp:   18 18  Temp: 98.4 F (36.9 C) 98 F (36.7 C)  98.3 F (36.8 C)  TempSrc: Oral Oral  Oral  SpO2:   94% 97%  Weight:      Height:        Intake/Output Summary (Last 24 hours) at 04/01/2017 1151 Last data filed at 04/01/2017 0600 Gross per 24 hour  Intake 360 ml  Output 1595 ml  Net -1235 ml   Filed Weights   03/26/17 1451 03/26/17 1600 03/29/17 0337  Weight: 61.2 kg (134 lb 14.7 oz) 60.8 kg (134 lb 0.6 oz) 68.1 kg (150 lb 2.1 oz)    General appearance: alert, cooperative, appears stated age and no distress Head: Normocephalic, without obvious abnormality, atraumatic Resp: Minister entry at the bases.  No  definite crackles or wheeze heard normal effort. Cardio: regular rate and rhythm, S1, S2 normal, no murmur, click, rub or gallop GI: soft, non-tender; bowel sounds normal; no masses,  no organomegaly and Ileal conduit is noted Extremities: edema Puffy edema noted bilateral upper and lower extremities Neurologic: Paraplegic.  Otherwise awake alert.  Responds appropriately.  No obvious deficits otherwise  Lab Results:  Data Reviewed: I have personally reviewed following labs and imaging studies  CBC: Recent Labs  Lab 03/26/17 0223  03/28/17 0323 03/29/17 0358 03/30/17 0500 03/30/17 1740 03/31/17 0420 04/01/17 0440  WBC 44.0*   < > 6.8 10.4 3.8*  --  5.8 6.1  NEUTROABS 39.6*  --   --  7.9*  --   --   --   --   HGB 12.8   < > 7.0* 8.0* 6.6* 9.3* 8.5* 8.0*  HCT 43.2   < > 23.4* 26.4* 22.3* 29.5* 26.9* 26.7*  MCV 69.5*   < > 68.8* 69.7* 70.3*  --  74.5* 75.0*  PLT 438*   < > 131* 205 135*  --  138* 146*   < > = values in this interval not displayed.    Basic Metabolic Panel: Recent Labs  Lab 03/27/17 0339 03/27/17 1755 03/28/17 0323 03/29/17 0358 03/30/17 0500 03/31/17 0420 04/01/17 0440  NA 144 141 137 140 141 142 141  K <2.0* 2.1* 3.4* 3.4* 2.9* 3.4* 4.2  CL 112* 111 106 109  110 111 110  CO2 26 23 26 26 28 27 28   GLUCOSE 167* 236* 114* 104* 81 93 81  BUN 8 6 <5* <5* 5* 7 8  CREATININE <0.30* 0.38* <0.30* <0.30* <0.30* <0.30* <0.30*  CALCIUM 7.2* 7.5* 7.3* 7.6* 7.3* 7.6* 7.6*  MG 1.4* 1.9 1.7 2.1  --   --   --   PHOS  --  1.0* 2.9 2.3*  --   --   --     GFR: CrCl cannot be calculated (This lab value cannot be used to calculate CrCl because it is not a number: <0.30).  Liver Function Tests: Recent Labs  Lab 03/26/17 0223  AST 34  ALT 26  ALKPHOS 153*  BILITOT 0.8  PROT 4.5*  ALBUMIN 2.3*    Recent Labs  Lab 03/26/17 0223  LIPASE 23   Cardiac Enzymes: Recent Labs  Lab 03/31/17 0420  CKTOTAL 22*    CBG: Recent Labs  Lab 03/27/17 2121  GLUCAP  164*   Anemia Panel: Recent Labs    03/30/17 0900  FERRITIN 14  TIBC 167*  IRON 98    Recent Results (from the past 240 hour(s))  Urine culture     Status: Abnormal   Collection Time: 03/26/17  2:23 AM  Result Value Ref Range Status   Specimen Description URINE, RANDOM  Final   Special Requests NONE  Final   Culture >=100,000 COLONIES/mL YEAST (A)  Final   Report Status 03/27/2017 FINAL  Final  Blood Culture (routine x 2)     Status: None   Collection Time: 03/26/17  2:24 AM  Result Value Ref Range Status   Specimen Description BLOOD RIGHT WRIST  Final   Special Requests IN PEDIATRIC BOTTLE Blood Culture adequate volume  Final   Culture   Final    NO GROWTH 5 DAYS Performed at [facility], [place] [address] [place], [place] [address]    Report Status 03/31/2017 FINAL  Final  Blood Culture (routine x 2)     Status: None   Collection Time: 03/26/17  2:26 AM  Result Value Ref Range Status   Specimen Description BLOOD PICC LINE  Final   Special Requests   Final    BOTTLES DRAWN AEROBIC AND ANAEROBIC Blood Culture adequate volume   Culture   Final    NO GROWTH 5 DAYS Performed at [facility], [place] [address] [place], [place] [address]    Report Status 03/31/2017 FINAL  Final  Gastrointestinal Panel by PCR , Stool     Status: None   Collection Time: 03/26/17  5:46 AM  Result Value Ref Range Status   Campylobacter species NOT DETECTED NOT DETECTED Final   Plesimonas shigelloides NOT DETECTED NOT DETECTED Final   Salmonella species NOT DETECTED NOT DETECTED Final   Yersinia enterocolitica NOT DETECTED NOT DETECTED Final   Vibrio species NOT DETECTED NOT DETECTED Final   Vibrio cholerae NOT DETECTED NOT DETECTED Final   Enteroaggregative E coli (EAEC) NOT DETECTED NOT DETECTED Final   Enteropathogenic E coli (EPEC) NOT DETECTED NOT DETECTED Final   Enterotoxigenic E coli (ETEC) NOT DETECTED NOT DETECTED Final   Shiga like toxin producing E coli (STEC) NOT  DETECTED NOT DETECTED Final   Shigella/Enteroinvasive E coli (EIEC) NOT DETECTED NOT DETECTED Final   Cryptosporidium NOT DETECTED NOT DETECTED Final   Cyclospora cayetanensis NOT DETECTED NOT DETECTED Final   Entamoeba histolytica NOT DETECTED NOT DETECTED Final   Giardia lamblia NOT DETECTED  NOT DETECTED Final   Adenovirus F40/41 NOT DETECTED NOT DETECTED Final   Astrovirus NOT DETECTED NOT DETECTED Final   Norovirus GI/GII NOT DETECTED NOT DETECTED Final   Rotavirus A NOT DETECTED NOT DETECTED Final   Sapovirus (I, II, IV, and V) NOT DETECTED NOT DETECTED Final    Comment: Performed at [facility], [address] [place], [place] [address]  C difficile quick scan w PCR reflex     Status: None   Collection Time: 03/26/17  5:46 AM  Result Value Ref Range Status   C Diff antigen NEGATIVE NEGATIVE Final   C Diff toxin NEGATIVE NEGATIVE Final   C Diff interpretation No C. difficile detected.  Final  MRSA PCR Screening     Status: None   Collection Time: 03/26/17  3:28 PM  Result Value Ref Range Status   MRSA by PCR NEGATIVE NEGATIVE Final    Comment:        The GeneXpert MRSA Assay (FDA approved for NASAL specimens only), is one component of a comprehensive MRSA colonization surveillance program. It is not intended to diagnose MRSA infection nor to guide or monitor treatment for MRSA infections.       Radiology Studies: No results found.   Medications:  Scheduled: . baclofen  5 mg Oral Q8H  . chlorhexidine  15 mL Mouth Rinse BID  . Chlorhexidine Gluconate Cloth  6 each Topical Daily  . Chlorhexidine Gluconate Cloth  6 each Topical Daily  . diazepam  5 mg Oral Q8H  . enoxaparin (LOVENOX) injection  40 mg Subcutaneous QHS  . feeding supplement  1 Container Oral BID BM  . ferrous sulfate  325 mg Oral Q breakfast  . gabapentin  300 mg Oral Q8H  . lamoTRIgine  50 mg Oral Daily  . mouth rinse  15 mL Mouth Rinse q12n4p  . protein supplement  8 oz Oral Daily  .  sertraline  100 mg Oral BID   Continuous: . DAPTOmycin (CUBICIN)  IV 500 mg (04/01/17 1106)  . piperacillin-tazobactam (ZOSYN)  IV 3.375 g (04/01/17 0909)   LZJ:[id number], fluticasone, loratadine, ondansetron (ZOFRAN) IV, sodium chloride flush  Assessment/Plan:  Principal Problem:   Diarrhea Active Problems:   Paraplegia (HCC)   History of transverse myelitis   Constipation   Osteomyelitis, pelvis (HCC)   Decubitus ulcer of left ischial area, stage III (HCC)   MDR Acinetobacter baumannii infection   Sepsis associated hypotension (HCC)   AKI (acute kidney injury) (HCC)   Somnolence   Facial droop   Acute kidney injury (nontraumatic) (HCC)    Septic shock/chronic osteomyelitis Patient presented with significantly elevated WBC, she was hypotensive, source of infection thought to be her sacral decubitus and osteomyelitis.  C. difficile was not detected on stool studies.  GI pathogen panel also unremarkable.  There is also possibility that she could have experienced side effects of her antibiotics.  Patient cultures have been negative so far.  ID was consulted.  IV fluids were stopped as she was developing edema.  Blood pressure has been stable.  She has been off of pressors for more than 48 hours.  In terms of antibiotic she is currently on daptomycin and Zosyn.  Secondary adrenal insufficiency She was given steroids.  Steroids were taken off yesterday.  Blood pressure stable.  Anemia of chronic disease/microcytic anemia/pancytopenia She did require 1 unit of transfusion in the last few days.  Hemoglobin is stable.  Pancytopenia is improved which was thought to be secondary to  Linezolid and was changed over to a different antibiotic.  Fecal occult blood testing was negative.  Apparently patient used to be on iron but could not tolerate it due to constipation.  Iron was resumed.  Continue to monitor.  Acute kidney injury Resolved with IV fluids.  Hypocalcemia Calcium is being  repleted.  Hypokalemia This has been repleted.  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  Wound care.  Flexi-Seal.   Paraplegia/transverse myelitis/multiple sclerosis These are chronic issues.  She is noted to be on multiple muscle relaxants.  DVT Prophylaxis: Lovenox Code Status: Full code Family Communication: Discussed with the patient and her mother Disposition Plan: Management as outlined above.  Okay for transfer to floor today.  Disposition remains uncertain at this point in time.    LOS: 6 days   [facility] Pager [phone] 04/01/2017, 11:51 AM  If 7PM-7AM, please contact night-coverage at [email or web address], password [facility]

## 2017-04-01 NOTE — Progress Notes (Signed)
Subjective: She and Mom are very worried about gong to SNF   Antibiotics:  Anti-infectives (From admission, onward)   Start     Dose/Rate Route Frequency Ordered Stop   04/01/17 1215  eravacycline (XERAVA) 68 mg in sodium chloride 0.9 % 150 mL IVPB     1 mg/kg  68.1 kg 150 mL/hr over 60 Minutes Intravenous Every 12 hours 04/01/17 1212     03/30/17 1000  DAPTOmycin (CUBICIN) 500 mg in sodium chloride 0.9 % IVPB     500 mg 220 mL/hr over 30 Minutes Intravenous Every 24 hours 03/30/17 0921     03/26/17 1000  piperacillin-tazobactam (ZOSYN) IVPB 3.375 g     3.375 g 12.5 mL/hr over 240 Minutes Intravenous Every 8 hours 03/26/17 0402     03/26/17 0345  linezolid (ZYVOX) tablet 600 mg  Status:  Discontinued     600 mg Oral Every 12 hours 03/26/17 0330 03/30/17 0829   03/26/17 0315  piperacillin-tazobactam (ZOSYN) IVPB 3.375 g     3.375 g 100 mL/hr over 30 Minutes Intravenous  Once 03/26/17 0314 03/26/17 0412      Medications: Scheduled Meds: . baclofen  5 mg Oral Q8H  . chlorhexidine  15 mL Mouth Rinse BID  . Chlorhexidine Gluconate Cloth  6 each Topical Daily  . Chlorhexidine Gluconate Cloth  6 each Topical Daily  . diazepam  5 mg Oral Q8H  . enoxaparin (LOVENOX) injection  40 mg Subcutaneous QHS  . eravacycline  1 mg/kg Intravenous Q12H  . feeding supplement  1 Container Oral BID BM  . ferrous sulfate  325 mg Oral Q breakfast  . gabapentin  300 mg Oral Q8H  . lamoTRIgine  50 mg Oral Daily  . mouth rinse  15 mL Mouth Rinse q12n4p  . protein supplement  8 oz Oral Daily  . sertraline  100 mg Oral BID   Continuous Infusions: . DAPTOmycin (CUBICIN)  IV 500 mg (04/01/17 1106)  . piperacillin-tazobactam (ZOSYN)  IV 3.375 g (04/01/17 0909)   PRN Meds:.diphenhydrAMINE, fluticasone, loratadine, ondansetron (ZOFRAN) IV, sodium chloride flush    Objective: Weight change:   Intake/Output Summary (Last 24 hours) at 04/01/2017 1213 Last data filed at 04/01/2017  1106 Gross per 24 hour  Intake 580 ml  Output 1995 ml  Net -1415 ml   Blood pressure 123/86, pulse (!) 101, temperature 98.3 F (36.8 C), temperature source Oral, resp. rate 18, height 5\' 3"  (1.6 m), weight 150 lb 2.1 oz (68.1 kg), SpO2 97 %. Temp:  [98 F (36.7 C)-98.5 F (36.9 C)] 98.3 F (36.8 C) (12/31 1143) Pulse Rate:  [95-114] 101 (12/31 1143) Resp:  [12-24] 18 (12/31 1143) BP: (111-144)/(65-87) 123/86 (12/31 1143) SpO2:  [92 %-97 %] 97 % (12/31 1143)  Physical Exam: General: Alert and awake, oriented x3, not in any acute distress. HEENT: anicteric sclera, EOMI CVS regular rate, normal r Chest:  no wheezing, resp distress Abdomen: soft  nondistended Wounds: not examined, rectal tube with some liquid stool Neuro: no new focal deficits  CBC:  CBC Latest Ref Rng & Units 04/01/2017 03/31/2017 03/30/2017  WBC 4.0 - 10.5 K/uL 6.1 5.8 -  Hemoglobin 12.0 - 15.0 g/dL 8.0(L) 8.5(L) 9.3(L)  Hematocrit 36.0 - 46.0 % 26.7(L) 26.9(L) 29.5(L)  Platelets 150 - 400 K/uL 146(L) 138(L) -      BMET Recent Labs    03/31/17 0420 04/01/17 0440  NA 142 141  K 3.4* 4.2  CL 111 110  CO2 27 28  GLUCOSE 93 81  BUN 7 8  CREATININE <0.30* <0.30*  CALCIUM 7.6* 7.6*     Liver Panel  No results for input(s): PROT, ALBUMIN, AST, ALT, ALKPHOS, BILITOT, BILIDIR, IBILI in the last 72 hours.     Sedimentation Rate No results for input(s): ESRSEDRATE in the last 72 hours. C-Reactive Protein No results for input(s): CRP in the last 72 hours.  Micro Results: Recent Results (from the past 720 hour(s))  Urine culture     Status: Abnormal   Collection Time: 03/26/17  2:23 AM  Result Value Ref Range Status   Specimen Description URINE, RANDOM  Final   Special Requests NONE  Final   Culture >=100,000 COLONIES/mL YEAST (A)  Final   Report Status 03/27/2017 FINAL  Final  Blood Culture (routine x 2)     Status: None   Collection Time: 03/26/17  2:24 AM  Result Value Ref Range Status    Specimen Description BLOOD RIGHT WRIST  Final   Special Requests IN PEDIATRIC BOTTLE Blood Culture adequate volume  Final   Culture   Final    NO GROWTH 5 DAYS Performed at [facility], [address], [place], [place] [address]    Report Status 03/31/2017 FINAL  Final  Blood Culture (routine x 2)     Status: None   Collection Time: 03/26/17  2:26 AM  Result Value Ref Range Status   Specimen Description BLOOD PICC LINE  Final   Special Requests   Final    BOTTLES DRAWN AEROBIC AND ANAEROBIC Blood Culture adequate volume   Culture   Final    NO GROWTH 5 DAYS Performed at [facility], [place] [address] [place], [place] [address]    Report Status 03/31/2017 FINAL  Final  Gastrointestinal Panel by PCR , Stool     Status: None   Collection Time: 03/26/17  5:46 AM  Result Value Ref Range Status   Campylobacter species NOT DETECTED NOT DETECTED Final   Plesimonas shigelloides NOT DETECTED NOT DETECTED Final   Salmonella species NOT DETECTED NOT DETECTED Final   Yersinia enterocolitica NOT DETECTED NOT DETECTED Final   Vibrio species NOT DETECTED NOT DETECTED Final   Vibrio cholerae NOT DETECTED NOT DETECTED Final   Enteroaggregative E coli (EAEC) NOT DETECTED NOT DETECTED Final   Enteropathogenic E coli (EPEC) NOT DETECTED NOT DETECTED Final   Enterotoxigenic E coli (ETEC) NOT DETECTED NOT DETECTED Final   Shiga like toxin producing E coli (STEC) NOT DETECTED NOT DETECTED Final   Shigella/Enteroinvasive E coli (EIEC) NOT DETECTED NOT DETECTED Final   Cryptosporidium NOT DETECTED NOT DETECTED Final   Cyclospora cayetanensis NOT DETECTED NOT DETECTED Final   Entamoeba histolytica NOT DETECTED NOT DETECTED Final   Giardia lamblia NOT DETECTED NOT DETECTED Final   Adenovirus F40/41 NOT DETECTED NOT DETECTED Final   Astrovirus NOT DETECTED NOT DETECTED Final   Norovirus GI/GII NOT DETECTED NOT DETECTED Final   Rotavirus A NOT DETECTED NOT DETECTED Final   Sapovirus (I, II,  IV, and V) NOT DETECTED NOT DETECTED Final    Comment: Performed at [facility], [address] [place], [place] [address]  C difficile quick scan w PCR reflex     Status: None   Collection Time: 03/26/17  5:46 AM  Result Value Ref Range Status   C Diff antigen NEGATIVE NEGATIVE Final   C Diff toxin NEGATIVE NEGATIVE Final   C Diff interpretation No C. difficile  detected.  Final  MRSA PCR Screening     Status: None   Collection Time: 03/26/17  3:28 PM  Result Value Ref Range Status   MRSA by PCR NEGATIVE NEGATIVE Final    Comment:        The GeneXpert MRSA Assay (FDA approved for NASAL specimens only), is one component of a comprehensive MRSA colonization surveillance program. It is not intended to diagnose MRSA infection nor to guide or monitor treatment for MRSA infections.     Studies/Results: No results found.    Assessment/Plan:  INTERVAL HISTORY: we have ERavacycline at [facility]   Principal Problem:   Diarrhea Active Problems:   Paraplegia ([place])   History of transverse myelitis   Constipation   Osteomyelitis, pelvis (HCC)   Decubitus ulcer of left ischial area, stage III (HCC)   MDR Acinetobacter baumannii infection   Sepsis associated hypotension (HCC)   AKI (acute kidney injury) ([place])   Somnolence   Facial droop   Acute kidney injury (nontraumatic) (HCC)    Apurva E Dubuque is a 36 y.o. 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 It will cover MRSA and her proteus as  well  I have put order in just now  It would be wise to still premedicate with zofran prior to dose  I will add back levaquin tomorrow  #3 TTpenia: resolving off of zyvox.  I spent greater than 40 minutes with the patient including greater than 50% of time in face to face counsel of the patient and Mom re plan with IV abx and in coordination of her care with pharmacy.     LOS: 6 days   Alcide Evener 04/01/2017, 12:13 PM

## 2017-04-01 NOTE — Progress Notes (Signed)
[facility]  Active pt with [facility] on home IV ABX prior to this admission. Pt's caregiver has a fractured shoulder and is unable to provide level of care needed int he home.  [facility] will work with hospital team on appropriate transition for care at [facility].  If patient discharges after hours, please call [phone].   Larry Sierras 04/01/2017, 10:12 AM

## 2017-04-02 DIAGNOSIS — A498 Other bacterial infections of unspecified site: Secondary | ICD-10-CM

## 2017-04-02 LAB — CBC
HCT: 28.1 % — ABNORMAL LOW (ref 36.0–46.0)
Hemoglobin: 8.7 g/dL — ABNORMAL LOW (ref 12.0–15.0)
MCH: 23.3 pg — AB (ref 26.0–34.0)
MCHC: 31 g/dL (ref 30.0–36.0)
MCV: 75.3 fL — AB (ref 78.0–100.0)
Platelets: 153 10*3/uL (ref 150–400)
RBC: 3.73 MIL/uL — AB (ref 3.87–5.11)
RDW: 24.4 % — ABNORMAL HIGH (ref 11.5–15.5)
WBC: 6.6 10*3/uL (ref 4.0–10.5)

## 2017-04-02 LAB — BASIC METABOLIC PANEL
Anion gap: 2 — ABNORMAL LOW (ref 5–15)
CO2: 31 mmol/L (ref 22–32)
Calcium: 7.8 mg/dL — ABNORMAL LOW (ref 8.9–10.3)
Chloride: 105 mmol/L (ref 101–111)
Creatinine, Ser: <0.3 mg/dL — ABNORMAL LOW (ref 0.44–1.00)
GLUCOSE: 80 mg/dL (ref 65–99)
POTASSIUM: 4 mmol/L (ref 3.5–5.1)
Sodium: 138 mmol/L (ref 135–145)

## 2017-04-02 MED ORDER — LEVOFLOXACIN 750 MG PO TABS
750.0000 mg | ORAL_TABLET | Freq: Every day | ORAL | Status: DC
  Administered 2017-04-02 – 2017-04-03 (×2): 750 mg via ORAL
  Filled 2017-04-02 (×2): qty 1

## 2017-04-02 MED ORDER — LOPERAMIDE HCL 2 MG PO CAPS
2.0000 mg | ORAL_CAPSULE | Freq: Once | ORAL | Status: AC
  Administered 2017-04-02: 2 mg via ORAL
  Filled 2017-04-02: qty 1

## 2017-04-02 NOTE — Progress Notes (Signed)
Subjective: Again concern for SNF, she is tolerating her newer tetracycline better   Antibiotics:  Anti-infectives (From admission, onward)   Start     Dose/Rate Route Frequency Ordered Stop   04/02/17 1500  levofloxacin (LEVAQUIN) tablet 750 mg     750 mg Oral Daily 04/02/17 1450     04/01/17 1400  eravacycline (XERAVA) 68 mg in sodium chloride 0.9 % 150 mL IVPB     1 mg/kg  68.1 kg 150 mL/hr over 60 Minutes Intravenous Every 12 hours 04/01/17 1212     03/30/17 1000  DAPTOmycin (CUBICIN) 500 mg in sodium chloride 0.9 % IVPB  Status:  Discontinued     500 mg 220 mL/hr over 30 Minutes Intravenous Every 24 hours 03/30/17 0921 04/01/17 1217   03/26/17 1000  piperacillin-tazobactam (ZOSYN) IVPB 3.375 g  Status:  Discontinued     3.375 g 12.5 mL/hr over 240 Minutes Intravenous Every 8 hours 03/26/17 0402 04/01/17 1217   03/26/17 0345  linezolid (ZYVOX) tablet 600 mg  Status:  Discontinued     600 mg Oral Every 12 hours 03/26/17 0330 03/30/17 0829   03/26/17 0315  piperacillin-tazobactam (ZOSYN) IVPB 3.375 g     3.375 g 100 mL/hr over 30 Minutes Intravenous  Once 03/26/17 0314 03/26/17 0412      Medications: Scheduled Meds: . baclofen  5 mg Oral Q8H  . chlorhexidine  15 mL Mouth Rinse BID  . diazepam  5 mg Oral Q8H  . enoxaparin (LOVENOX) injection  40 mg Subcutaneous QHS  . eravacycline  1 mg/kg Intravenous Q12H  . feeding supplement  1 Container Oral BID BM  . ferrous sulfate  325 mg Oral Q breakfast  . gabapentin  300 mg Oral Q8H  . lamoTRIgine  50 mg Oral Daily  . levofloxacin  750 mg Oral Daily  . mouth rinse  15 mL Mouth Rinse q12n4p  . ondansetron (ZOFRAN) IV  4 mg Intravenous Q12H  . protein supplement  8 oz Oral Daily  . sertraline  100 mg Oral BID   Continuous Infusions:  PRN Meds:.diphenhydrAMINE, fluticasone, ketorolac, loratadine, ondansetron (ZOFRAN) IV, sodium chloride flush    Objective: Weight change:   Intake/Output Summary (Last 24  hours) at 04/02/2017 1451 Last data filed at 04/02/2017 1325 Gross per 24 hour  Intake 420 ml  Output 3850 ml  Net -3430 ml   Blood pressure 105/62, pulse 97, temperature 98.7 F (37.1 C), temperature source Oral, resp. rate 16, height _0  (1.6 m), weight 150 lb 2.1 oz (68.1 kg), SpO2 97 %. Temp:  [98.4 F (36.9 C)-98.7 F (37.1 C)] 98.7 F (37.1 C) (01/01 1254) Pulse Rate:  [88-97] 97 (01/01 1254) Resp:  [15-16] 16 (01/01 1254) BP: (105-122)/(62-84) 105/62 (01/01 1254) SpO2:  [97 %-98 %] 97 % (01/01 1254)  Physical Exam: General: Alert and awake, oriented x3, not in any acute distress. HEENT: anicteric sclera, EOMI CVS regular rate, normal r Chest:  no wheezing, resp distress Abdomen: soft  nondistended Wounds: not examined, rectal tube with some liquid stool Neuro: no new focal deficits  CBC:  CBC Latest Ref Rng & Units 04/02/2017 04/01/2017 03/31/2017  WBC 4.0 - 10.5 K/uL 6.6 6.1 5.8  Hemoglobin 12.0 - 15.0 g/dL 8.7(L) 8.0(L) 8.5(L)  Hematocrit 36.0 - 46.0 % 28.1(L) 26.7(L) 26.9(L)  Platelets 150 - 400 K/uL 153 146(L) 138(L)      BMET Recent Labs    04/01/17 0440 04/02/17 0457  NA 141  138  K 4.2 4.0  CL 110 105  CO2 28 31  GLUCOSE 81 80  BUN 8 <5*  CREATININE <0.30* <0.30*  CALCIUM 7.6* 7.8*     Liver Panel  No results for input(s): PROT, ALBUMIN, AST, ALT, ALKPHOS, BILITOT, BILIDIR, IBILI in the last 72 hours.     Sedimentation Rate No results for input(s): ESRSEDRATE in the last 72 hours. C-Reactive Protein No results for input(s): CRP in the last 72 hours.  Micro Results: Recent Results (from the past 720 hour(s))  Urine culture     Status: Abnormal   Collection Time: 03/26/17  2:23 AM  Result Value Ref Range Status   Specimen Description URINE, RANDOM  Final   Special Requests NONE  Final   Culture >=100,000 COLONIES/mL YEAST (A)  Final   Report Status 03/27/2017 FINAL  Final  Blood Culture (routine x 2)     Status: None   Collection  Time: 03/26/17  2:24 AM  Result Value Ref Range Status   Specimen Description BLOOD RIGHT WRIST  Final   Special Requests IN PEDIATRIC BOTTLE Blood Culture adequate volume  Final   Culture   Final    NO GROWTH 5 DAYS Performed at [facility], [address] [place], [place] [address]    Report Status 03/31/2017 FINAL  Final  Blood Culture (routine x 2)     Status: None   Collection Time: 03/26/17  2:26 AM  Result Value Ref Range Status   Specimen Description BLOOD PICC LINE  Final   Special Requests   Final    BOTTLES DRAWN AEROBIC AND ANAEROBIC Blood Culture adequate volume   Culture   Final    NO GROWTH 5 DAYS Performed at [facility], [place] [address], [place], [place] [address]    Report Status 03/31/2017 FINAL  Final  Gastrointestinal Panel by PCR , Stool     Status: None   Collection Time: 03/26/17  5:46 AM  Result Value Ref Range Status   Campylobacter species NOT DETECTED NOT DETECTED Final   Plesimonas shigelloides NOT DETECTED NOT DETECTED Final   Salmonella species NOT DETECTED NOT DETECTED Final   Yersinia enterocolitica NOT DETECTED NOT DETECTED Final   Vibrio species NOT DETECTED NOT DETECTED Final   Vibrio cholerae NOT DETECTED NOT DETECTED Final   Enteroaggregative E coli (EAEC) NOT DETECTED NOT DETECTED Final   Enteropathogenic E coli (EPEC) NOT DETECTED NOT DETECTED Final   Enterotoxigenic E coli (ETEC) NOT DETECTED NOT DETECTED Final   Shiga like toxin producing E coli (STEC) NOT DETECTED NOT DETECTED Final   Shigella/Enteroinvasive E coli (EIEC) NOT DETECTED NOT DETECTED Final   Cryptosporidium NOT DETECTED NOT DETECTED Final   Cyclospora cayetanensis NOT DETECTED NOT DETECTED Final   Entamoeba histolytica NOT DETECTED NOT DETECTED Final   Giardia lamblia NOT DETECTED NOT DETECTED Final   Adenovirus F40/41 NOT DETECTED NOT DETECTED Final   Astrovirus NOT DETECTED NOT DETECTED Final   Norovirus GI/GII NOT DETECTED NOT DETECTED Final   Rotavirus  A NOT DETECTED NOT DETECTED Final   Sapovirus (I, II, IV, and V) NOT DETECTED NOT DETECTED Final    Comment: Performed at [facility], [address] [place], [place] [address]  C difficile quick scan w PCR reflex     Status: None   Collection Time: 03/26/17  5:46 AM  Result Value Ref Range Status   C Diff antigen NEGATIVE NEGATIVE Final   C Diff toxin NEGATIVE  NEGATIVE Final   C Diff interpretation No C. difficile detected.  Final  MRSA PCR Screening     Status: None   Collection Time: 03/26/17  3:28 PM  Result Value Ref Range Status   MRSA by PCR NEGATIVE NEGATIVE Final    Comment:        The GeneXpert MRSA Assay (FDA approved for NASAL specimens only), is one component of a comprehensive MRSA colonization surveillance program. It is not intended to diagnose MRSA infection nor to guide or monitor treatment for MRSA infections.     Studies/Results: No results found.    Assessment/Plan:  INTERVAL HISTORY: ERavacycline started and no worsening diarrhea   Principal Problem:   Diarrhea Active Problems:   Paraplegia (HCC)   History of transverse myelitis   Constipation   Osteomyelitis, pelvis (HCC)   Decubitus ulcer of left ischial area, stage III (HCC)   MDR Acinetobacter baumannii infection   Sepsis associated hypotension (HCC)   AKI (acute kidney injury) (HCC)   Somnolence   Facial droop   Acute kidney injury (nontraumatic) (HCC)   Hypotension due to hypovolemia   Thrombocytopenia ([place])    [name] is a 37 y.o. 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 who has been admitted on 03/26/17 with severer sepsis, profuse diarrhea/n/v, currently on daptomycin and zosyn   #1 Episode of sepsis: could be partly due to N,V, D + superimposed infection of her ulcer due to fecal contamination  I am going to switch to Eravacycline and adding levaquin today  #2 Sacral osteomyelitis:     Continue  Eravacycline q 12 with premedication with zofran + levaquin 736m daily  I would try to get her through 4 weeks of these antibiotics with HSFU with me or one of my partners prior to DC abx and consideration for extending it further  Diagnosis: Osteomyelitis   Culture Result: MD Resistant Acinetobacter methicillin-resistant Staphylococcus aureus and Proteus  Allergies  Allergen Reactions  . Iodine Anaphylaxis  . Latex Anaphylaxis  . Polymyxin B Hives    Pt had hives in right arm with infusion of polymyxin on November 26th, 2018  . Shellfish-Derived Products Anaphylaxis    Reaction unknown  . Vancomycin Anaphylaxis  . Aspirin Other (See Comments)    Reaction unknown  . Strawberry Extract Hives and Rash    OPAT Orders Discharge antibiotics: Eravacycline 68 mg IV q 12 hours and Levaquin [id number] qdaily   Duration: 4 weeks  End Date:  January 28th   PIL[place] Protocol:  Labs weekly while on IV antibiotics: _x_ CBC with differential _x_ BMP  __ CRP __ ESR   __ Please pull PIC at completion of IV antibiotics _x_ Please leave PIC in place until doctor has seen patient or been notified  Fax weekly labs to ([phone] Follow Up Appt:  Next 3 weeks with me, please do NOT DC abx until she is re-evalauted by us[place]Dr. SnBaxter Flatteryo pick up the service tomorrow.    LOS: 7 days   CoAlcide Evener/04/2017, 2:51 PM

## 2017-04-02 NOTE — NC FL2 (Signed)
[place] LEVEL OF CARE SCREENING TOOL     IDENTIFICATION  Patient Name: [name] Birthdate: [birth year] Sex: female Admission Date (Current Location): 03/26/2017  [facility] and [place] Number:  Herbalist and Address:  [facility],  [place] [address] [place]      Provider Number: [id number]  Attending Physician Name and Address:  Bonnielee Haff, MD  Relative Name and Phone Number:       Current Level of Care: Hospital Recommended Level of Care: [place] Prior Approval Number:    Date Approved/Denied:   PASRR Number:    Discharge Plan: SNF    Current Diagnoses: Patient Active Problem List   Diagnosis Date Noted  . Hypotension due to hypovolemia   . Thrombocytopenia ([place])   . Acute kidney injury (nontraumatic) ([place])   . Sepsis associated hypotension ([place]) 03/26/2017  . AKI (acute kidney injury) ([place]) 03/26/2017  . Somnolence   . Facial droop   . MDR Acinetobacter baumannii infection   . Acinetobacter lwoffi infection   . Proteus infection   . MRSA infection   . Decubitus ulcer of left ischial area, stage III ([place]) 11/15/2016  . Diarrhea 07/17/2016  . Osteomyelitis, pelvis ([place]) 02/16/2016  . Constipation 10/29/2014  . Multiple sclerosis ([place]) 10/16/2011  . Paraplegia ([place])   . History of transverse myelitis     Orientation RESPIRATION BLADDER Height & Weight     Self, Time, Situation, Place  Normal Incontinent(ureterostomy) Weight: 150 lb 2.1 oz (68.1 kg) Height:  5\' 3"  (160 cm)  BEHAVIORAL SYMPTOMS/MOOD NEUROLOGICAL BOWEL NUTRITION STATUS      Incontinent(rectal tube) Diet(regular diet)  AMBULATORY STATUS COMMUNICATION OF NEEDS Skin   Extensive Assist Verbally PU Stage and Appropriate Care                       Personal Care Assistance Level of Assistance  Bathing, Feeding, Dressing Bathing Assistance: Maximum assistance Feeding assistance: Limited assistance Dressing  Assistance: Maximum assistance     Functional Limitations Info  Sight, Hearing, Speech Sight Info: Adequate Hearing Info: Adequate Speech Info: Adequate    SPECIAL CARE FACTORS FREQUENCY  PT (By licensed PT), OT (By licensed OT)     PT Frequency: 5x OT Frequency: 5x            Contractures Contractures Info: Not present    Additional Factors Info  Code Status, Allergies, Isolation Precautions Code Status Info: full code Allergies Info: Iodine, Latex, Polymyxin B, Shellfish-derived Products, Vancomycin, Aspirin, Strawberry Extract     Isolation Precautions Info: contact precautions- OMDRO/MRSA     Current Medications (04/02/2017):  This is the current hospital active medication list Current Facility-Administered Medications  Medication Dose Route Frequency Provider Last Rate Last Dose  . baclofen (LIORESAL) tablet 5 mg  5 mg Oral Q8H Chesley Mires, MD   5 mg at 04/02/17 1317  . chlorhexidine (PERIDEX) 0.12 % solution 15 mL  15 mL Mouth Rinse BID Irene Pap N, DO   15 mL at 04/02/17 0919  . diazepam (VALIUM) tablet 5 mg  5 mg Oral Q8H Chesley Mires, MD   5 mg at 04/02/17 1318  . diphenhydrAMINE (BENADRYL) capsule 25 mg  25 mg Oral Q6H PRN Chesley Mires, MD      . enoxaparin (LOVENOX) injection 40 mg  40 mg Subcutaneous QHS Hall, Carole N, DO   40 mg at 04/01/17 2229  . eravacycline (XERAVA) 68 mg in sodium chloride  0.9 % 150 mL IVPB  1 mg/kg Intravenous Q12H Tommy Medal, Lavell Islam, MD   68 mg at 04/02/17 0421  . feeding supplement (BOOST / RESOURCE BREEZE) liquid 1 Container  1 Container Oral BID BM Irene Pap N, DO   1 Container at 03/29/17 1432  . ferrous sulfate tablet 325 mg  325 mg Oral Q breakfast Edwin Dada, MD   325 mg at 04/02/17 0919  . fluticasone (FLONASE) 50 MCG/ACT nasal spray 2 spray  2 spray Each Nare Daily PRN [name], DO   2 spray at 03/31/17 2234  . gabapentin (NEURONTIN) capsule 300 mg  300 mg Oral Q8H Chesley Mires, MD   300 mg at  04/02/17 1318  . ketorolac (TORADOL) 15 MG/ML injection 15 mg  15 mg Intravenous Q8H PRN Bonnielee Haff, MD   15 mg at 04/01/17 2227  . lamoTRIgine (LAMICTAL) tablet 50 mg  50 mg Oral Daily Chesley Mires, MD   50 mg at 04/02/17 0919  . loratadine (CLARITIN) tablet 5 mg  5 mg Oral Daily PRN [name], DO   5 mg at 03/31/17 2234  . MEDLINE mouth rinse  15 mL Mouth Rinse q12n4p Hall, Carole N, DO   15 mL at 03/31/17 1300  . ondansetron (ZOFRAN) injection 4 mg  4 mg Intravenous Q6H PRN Tommy Medal, Lavell Islam, MD   4 mg at 03/31/17 2334  . ondansetron (ZOFRAN) injection 4 mg  4 mg Intravenous Q12H Tommy Medal, Lavell Islam, MD   4 mg at 04/02/17 1318  . protein supplement (UNJURY CHICKEN SOUP) powder 8 oz  8 oz Oral Daily Irene Pap N, DO   8 oz at 03/31/17 1920  . sertraline (ZOLOFT) tablet 100 mg  100 mg Oral BID Edwin Dada, MD   100 mg at 04/02/17 0919  . sodium chloride flush (NS) 0.9 % injection 10-40 mL  10-40 mL Intracatheter PRN Kayleen Memos, DO         Discharge Medications: Please see discharge summary for a list of discharge medications.  Relevant Imaging Results:  Relevant Lab Results:   Additional Information [id number]. Needs IV antibiotics   Nila Nephew, LCSW

## 2017-04-02 NOTE — Progress Notes (Signed)
[facility] PROGRESS NOTE  [name]:[id number] DOB: 11-07-1980 DOA: 03/26/2017  PCP: Lennie Odor, PA-C  Brief History/Interval Summary: 37 year old Caucasian female with a past medical history of paraplegia, multiple sclerosis, transverse myelitis recently admitted last month for sacral decubitus with osteomyelitis.  She was discharged on 12/10 with a PICC line.  She was discharged on tigecycline and oral Levaquin.  Presented with complains of nausea vomiting diarrhea.  Patient had leukocytosis and noted to be hypotensive.  She was given fluid boluses.  C. difficile PCR was negative.  ID was consulted.  Patient was stabilized.  And then transferred to the floor.  Reason for Visit: Osteomyelitis with septic shock  Consultants: Infectious disease  Procedures: None  Antibiotics:  Tigecycline 12/03 >>12/24  Levaquin 12/03 >>12/24  Linezolid 12/24 >> 12/29  Zosyn 12/24 >>12/31  Daptomycin 12/29 >>12/31  Eravacycline 12/31>>   Subjective/Interval History: Patient seems to be doing well.  She denies any complaints.  Continues to have loose stools.  Denies any nausea vomiting.  She is willing to take Imodium.    ROS: Denies any chest pain or shortness of breath  Objective:  Vital Signs  Vitals:   04/01/17 0800 04/01/17 1143 04/01/17 2300 04/02/17 0515  BP: 115/70 123/86 117/84 122/78  Pulse: (!) 104 (!) 101 93 88  Resp: 18 18 15 16   Temp:  98.3 F (36.8 C) 98.4 F (36.9 C) 98.4 F (36.9 C)  TempSrc:  Oral Oral Oral  SpO2: 94% 97% 97% 98%  Weight:      Height:        Intake/Output Summary (Last 24 hours) at 04/02/2017 1017 Last data filed at 04/02/2017 0515 Gross per 24 hour  Intake 640 ml  Output 3800 ml  Net -3160 ml   Filed Weights   03/26/17 1451 03/26/17 1600 03/29/17 0337  Weight: 61.2 kg (134 lb 14.7 oz) 60.8 kg (134 lb 0.6 oz) 68.1 kg (150 lb 2.1 oz)    General appearance: Awake alert.  In no distress Resp: Good air entry  bilaterally.  No wheezing rales or rhonchi.   Cardio: S1-S2 is normal regular GI: Abdomen remains soft.  Nontender nondistended.  Ileal conduit noted. Extremities: Mild edema noted bilateral upper and lower extremities Neurologic: Paraplegic.  Otherwise awake alert.  Responds appropriately.  No obvious deficits otherwise  Lab Results:  Data Reviewed: I have personally reviewed following labs and imaging studies  CBC: Recent Labs  Lab 03/29/17 0358 03/30/17 0500 03/30/17 1740 03/31/17 0420 04/01/17 0440 04/02/17 0457  WBC 10.4 3.8*  --  5.8 6.1 6.6  NEUTROABS 7.9*  --   --   --   --   --   HGB 8.0* 6.6* 9.3* 8.5* 8.0* 8.7*  HCT 26.4* 22.3* 29.5* 26.9* 26.7* 28.1*  MCV 69.7* 70.3*  --  74.5* 75.0* 75.3*  PLT 205 135*  --  138* 146* 563    Basic Metabolic Panel: Recent Labs  Lab 03/27/17 0339 03/27/17 1755 03/28/17 0323 03/29/17 0358 03/30/17 0500 03/31/17 0420 04/01/17 0440 04/02/17 0457  NA 144 141 137 140 141 142 141 138  K <2.0* 2.1* 3.4* 3.4* 2.9* 3.4* 4.2 4.0  CL 112* 111 106 109 110 111 110 105  CO2 26 23 26 26 28 27 28 31   GLUCOSE 167* 236* 114* 104* 81 93 81 80  BUN 8 6 <5* <5* 5* 7 8 <5*  CREATININE <0.30* 0.38* <0.30* <0.30* <0.30* <0.30* <0.30* <0.30*  CALCIUM 7.2* 7.5* 7.3* 7.6* 7.3*  7.6* 7.6* 7.8*  MG 1.4* 1.9 1.7 2.1  --   --   --   --   PHOS  --  1.0* 2.9 2.3*  --   --   --   --     GFR: CrCl cannot be calculated (This lab value cannot be used to calculate CrCl because it is not a number: <0.30). Cardiac Enzymes: Recent Labs  Lab 03/31/17 0420  CKTOTAL 22*    CBG: Recent Labs  Lab 03/27/17 2121  GLUCAP 164*     Recent Results (from the past 240 hour(s))  Urine culture     Status: Abnormal   Collection Time: 03/26/17  2:23 AM  Result Value Ref Range Status   Specimen Description URINE, RANDOM  Final   Special Requests NONE  Final   Culture >=100,000 COLONIES/mL YEAST (A)  Final   Report Status 03/27/2017 FINAL  Final  Blood  Culture (routine x 2)     Status: None   Collection Time: 03/26/17  2:24 AM  Result Value Ref Range Status   Specimen Description BLOOD RIGHT WRIST  Final   Special Requests IN PEDIATRIC BOTTLE Blood Culture adequate volume  Final   Culture   Final    NO GROWTH 5 DAYS Performed at [facility], [place] [address] [place], [place] [address]    Report Status 03/31/2017 FINAL  Final  Blood Culture (routine x 2)     Status: None   Collection Time: 03/26/17  2:26 AM  Result Value Ref Range Status   Specimen Description BLOOD PICC LINE  Final   Special Requests   Final    BOTTLES DRAWN AEROBIC AND ANAEROBIC Blood Culture adequate volume   Culture   Final    NO GROWTH 5 DAYS Performed at [facility], [place] [address], [place], [place] [address]    Report Status 03/31/2017 FINAL  Final  Gastrointestinal Panel by PCR , Stool     Status: None   Collection Time: 03/26/17  5:46 AM  Result Value Ref Range Status   Campylobacter species NOT DETECTED NOT DETECTED Final   Plesimonas shigelloides NOT DETECTED NOT DETECTED Final   Salmonella species NOT DETECTED NOT DETECTED Final   Yersinia enterocolitica NOT DETECTED NOT DETECTED Final   Vibrio species NOT DETECTED NOT DETECTED Final   Vibrio cholerae NOT DETECTED NOT DETECTED Final   Enteroaggregative E coli (EAEC) NOT DETECTED NOT DETECTED Final   Enteropathogenic E coli (EPEC) NOT DETECTED NOT DETECTED Final   Enterotoxigenic E coli (ETEC) NOT DETECTED NOT DETECTED Final   Shiga like toxin producing E coli (STEC) NOT DETECTED NOT DETECTED Final   Shigella/Enteroinvasive E coli (EIEC) NOT DETECTED NOT DETECTED Final   Cryptosporidium NOT DETECTED NOT DETECTED Final   Cyclospora cayetanensis NOT DETECTED NOT DETECTED Final   Entamoeba histolytica NOT DETECTED NOT DETECTED Final   Giardia lamblia NOT DETECTED NOT DETECTED Final   Adenovirus F40/41 NOT DETECTED NOT DETECTED Final   Astrovirus NOT DETECTED NOT DETECTED Final    Norovirus GI/GII NOT DETECTED NOT DETECTED Final   Rotavirus A NOT DETECTED NOT DETECTED Final   Sapovirus (I, II, IV, and V) NOT DETECTED NOT DETECTED Final    Comment: Performed at [facility], [address] [place], [place] [address]  C difficile quick scan w PCR reflex     Status: None   Collection Time: 03/26/17  5:46 AM  Result Value Ref Range Status   C Diff antigen  NEGATIVE NEGATIVE Final   C Diff toxin NEGATIVE NEGATIVE Final   C Diff interpretation No C. difficile detected.  Final  MRSA PCR Screening     Status: None   Collection Time: 03/26/17  3:28 PM  Result Value Ref Range Status   MRSA by PCR NEGATIVE NEGATIVE Final    Comment:        The GeneXpert MRSA Assay (FDA approved for NASAL specimens only), is one component of a comprehensive MRSA colonization surveillance program. It is not intended to diagnose MRSA infection nor to guide or monitor treatment for MRSA infections.       Radiology Studies: No results found.   Medications:  Scheduled: . baclofen  5 mg Oral Q8H  . chlorhexidine  15 mL Mouth Rinse BID  . diazepam  5 mg Oral Q8H  . enoxaparin (LOVENOX) injection  40 mg Subcutaneous QHS  . eravacycline  1 mg/kg Intravenous Q12H  . feeding supplement  1 Container Oral BID BM  . ferrous sulfate  325 mg Oral Q breakfast  . gabapentin  300 mg Oral Q8H  . lamoTRIgine  50 mg Oral Daily  . mouth rinse  15 mL Mouth Rinse q12n4p  . ondansetron (ZOFRAN) IV  4 mg Intravenous Q12H  . protein supplement  8 oz Oral Daily  . sertraline  100 mg Oral BID   Continuous:  [id number], fluticasone, ketorolac, loratadine, ondansetron (ZOFRAN) IV, sodium chloride flush  Assessment/Plan:  Principal Problem:   Diarrhea Active Problems:   Paraplegia (HCC)   History of transverse myelitis   Constipation   Osteomyelitis, pelvis (HCC)   Decubitus ulcer of left ischial area, stage III (HCC)   MDR Acinetobacter baumannii infection   Sepsis  associated hypotension (HCC)   AKI (acute kidney injury) (HCC)   Somnolence   Facial droop   Acute kidney injury (nontraumatic) (HCC)   Hypotension due to hypovolemia   Thrombocytopenia (HCC)    Septic shock/chronic osteomyelitis Patient presented with significantly elevated WBC, she was hypotensive, source of infection thought to be her sacral decubitus and osteomyelitis.  C. difficile was not detected on stool studies.  GI pathogen panel also unremarkable.  There is also possibility that she could have experienced side effects of her antibiotics.  Patient cultures have been negative so far.  ID was consulted.  IV fluids were stopped as she was developing edema.  Blood pressure has been stable.  She has been off of pressors for more than 48 hours.  Patient had been on daptomycin and Zosyn.  Changed over to Earavacycline on 12/31 with plans to add Levaquin today.    Secondary adrenal insufficiency She was given steroids.  Steroids were discontinued on 12/30.  Blood pressure remains stable.    Anemia of chronic disease/microcytic anemia/pancytopenia She did require 1 unit of transfusion in the last few days.  Hemoglobin is stable.  Pancytopenia is improved which was thought to be secondary to Linezolid and was changed over to a different antibiotic.  Fecal occult blood testing was negative.  Apparently patient used to be on iron but could not tolerate it due to constipation.  Iron was resumed.  Hemoglobin is stable.  Continue to monitor.  Acute kidney injury Resolved with IV fluids.  Hypocalcemia Calcium is being repleted.  Hypokalemia This has been repleted.  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  Wound care.    Diarrhea  Probably due to antibiotics.  C. difficile has been negative.  Continues to have high output from Flexi-Seal.  Could try Imodium.  She agrees to take.   Paraplegia/transverse myelitis/multiple  sclerosis These are chronic issues.  She is noted to be on multiple muscle relaxants.  DVT Prophylaxis: Lovenox Code Status: Full code Family Communication: Discussed with the patient and her mother Disposition Plan: Management as outlined above.  Social worker continues to follow as the patient may need to go to skilled nursing facility for short-term.      LOS: 7 days   [facility] Pager [phone] 04/02/2017, 10:17 AM  If 7PM-7AM, please contact night-coverage at [email or web address], password [facility]

## 2017-04-02 NOTE — Clinical Social Work Note (Addendum)
Clinical Social Work Assessment  Patient Details  Name: [name] MRN: [id number] Date of Birth: 04-Aug-1980  Date of referral:  04/02/17               Reason for consult:  Facility Placement                Permission sought to share information with:  Family Supports Permission granted to share information::  Yes, Verbal Permission Granted  Name::        Agency::  SNF  Relationship::  Mother  Contact Information:     Housing/Transportation Living arrangements for the past 2 months:  Single Family Home Source of Information:  Patient, Parent Patient Interpreter Needed:  None Criminal Activity/Legal Involvement Pertinent to Current Situation/Hospitalization:  No - Comment as needed Significant Relationships:  Parents Lives with:  Parents Do you feel safe going back to the place where you live?  Yes Need for family participation in patient care:  Yes (Comment)  Care giving concerns:   Patient recently discharge on 12/10 with PICC line and antibiotics. The patient admitted from home for severe diarrhea.   Patient will need to continue her IV antibiotics at discharge.  PT recommendS SNF vs. LTACH  Patient mother is her primary caretaker, she suffered a fall and fractured her arm. She feels she can continue caring for patient with the help and support of patient, "turning herself."   The patient understands how difficult it will be for her mother to care for her at time. She prefers to return home but is agreeable for CSW to fax her information to surrounding SNF's in the area. Patient request to sit down and talk with a representative from facility so that she can ask questions about the care there and feel more comfortable admitting.   Social Worker assessment / plan:  CSW met with patient and daughter at bedside, explain role and reason for visit- to assist with finding placement at [facility]. CSW asked questions about patient care at home. Patient reports, "My mother takes good care  of me and I feel she does her best. I do not feel neglected or harmed. " Patient tearful about her mother hurting her arm.   CSW explain SNF process and search for medicaid bed. Patient agreeable to be faxed out in [place] area.  CSW inquire and arrange for the SNF representative to meet via phone or in person so that she can ask question about there and services.   Patient reports her goal is to return home with her mother and have a Holiday where she is not in the hospital.  FL2. Level II PASRR to be completed.   Plan: Assist with discharge to SNF.   Employment status:  Disabled (Comment on whether or not currently receiving Disability) Insurance information:  [place]icaid [place] PT Recommendations:  Not assessed at this time Information / Referral to community resources:   SNF Placement  Patient/Family's Response to care:  The patient understands she will have to give her up medicaid check to be placed. She reports feeling skeptical and unsure due to her unpleasant past experience at the facility. Patient and daughter both agreeable for CSW to inquire about the SNF beds that will take medicaid.  Patient/Family's Understanding of and Emotional Response to Diagnosis, Current Treatment, and Prognosis: Patient is knowledgeable of her care, diagnosis. Patient mother is her primary caretaker and very involved in patient care.   Emotional Assessment Appearance:  Appears stated age Attitude/Demeanor/Rapport:  Affect (typically observed):  Calm Orientation:  Oriented to Self, Oriented to Place, Oriented to  Time, Oriented to Situation Alcohol / Substance use:  Not Applicable Psych involvement (Current and /or in the community):  No (Comment)  Discharge Needs  Concerns to be addressed:  Discharge Planning Concerns Readmission within the last 30 days:  No Current discharge risk:  Physical Impairment, Dependent with Mobility Barriers to Discharge:  Continued Medical Work  up   [employer], LCSW 04/02/2017, 12:23 PM

## 2017-04-03 MED ORDER — LOPERAMIDE HCL 2 MG PO CAPS: 4.0000 mg | ORAL_CAPSULE | Freq: Three times a day (TID) | ORAL | Status: DC | PRN

## 2017-04-03 MED ORDER — LOPERAMIDE HCL 2 MG PO CAPS
4.0000 mg | ORAL_CAPSULE | Freq: Once | ORAL | Status: AC
  Administered 2017-04-03: 4 mg via ORAL
  Filled 2017-04-03: qty 2

## 2017-04-03 MED ORDER — LEVOFLOXACIN 750 MG PO TABS: 750.0000 mg | ORAL_TABLET | Freq: Every day | ORAL | 0 refills | Status: DC

## 2017-04-03 MED ORDER — FERROUS SULFATE 325 (65 FE) MG PO TABS: 325.0000 mg | ORAL_TABLET | Freq: Every day | ORAL | 3 refills | Status: DC

## 2017-04-03 MED ORDER — ERAVACYCLINE DIHYDROCHLORIDE 50 MG IV SOLR: 68.0000 mg | Freq: Two times a day (BID) | INTRAVENOUS | 0 refills | Status: DC

## 2017-04-03 MED ORDER — DIAZEPAM 5 MG PO TABS: 5.0000 mg | ORAL_TABLET | Freq: Three times a day (TID) | ORAL | 0 refills | Status: DC

## 2017-04-03 MED ORDER — HEPARIN SOD (PORK) LOCK FLUSH 100 UNIT/ML IV SOLN
250.0000 [IU] | INTRAVENOUS | Status: AC | PRN
  Administered 2017-04-03: 250 [IU]

## 2017-04-03 MED ORDER — SERTRALINE HCL 100 MG PO TABS: 100.0000 mg | ORAL_TABLET | Freq: Two times a day (BID) | ORAL | 0 refills | Status: DC

## 2017-04-03 MED ORDER — LOPERAMIDE HCL 2 MG PO CAPS: 4.0000 mg | ORAL_CAPSULE | Freq: Three times a day (TID) | ORAL | 0 refills | Status: DC | PRN

## 2017-04-03 MED ORDER — LAMOTRIGINE 25 MG PO TABS: 50.0000 mg | ORAL_TABLET | Freq: Every day | ORAL | 0 refills | Status: DC

## 2017-04-03 NOTE — Discharge Summary (Signed)
[facility]  Physician Discharge Summary   Patient ID: [name] MRN: [id number] DOB/AGE: [birth year] 37 y.o.  Admit date: 03/26/2017 Discharge date: 04/03/2017  PCP: Lennie Odor, PA-C  DISCHARGE DIAGNOSES:  Principal Problem:   Diarrhea Active Problems:   Paraplegia ([place])   History of transverse myelitis   Constipation   Osteomyelitis, pelvis (HCC)   Decubitus ulcer of left ischial area, stage III (HCC)   MDR Acinetobacter baumannii infection   Sepsis associated hypotension (HCC)   AKI (acute kidney injury) (HCC)   Somnolence   Facial droop   Acute kidney injury (nontraumatic) (HCC)   Hypotension due to hypovolemia   Thrombocytopenia (HCC)   RECOMMENDATIONS FOR OUTPATIENT FOLLOW UP: 1. Needs antibiotics for 4 weeks per ID. 2. They will arrange outpatient follow-up. 3. She will need weekly CBC with differential and basic metabolic panel 4. Please do not pull out PICC line until infectious disease has cleared the patient for same.   DISCHARGE CONDITION: fair  Diet recommendation: Regular as tolerated  Filed Weights   03/26/17 1451 03/26/17 1600 03/29/17 0337  Weight: 61.2 kg (134 lb 14.7 oz) 60.8 kg (134 lb 0.6 oz) 68.1 kg (150 lb 2.1 oz)    INITIAL HISTORY: 37 year old Caucasian female with a past medical history of paraplegia, multiple sclerosis, transverse myelitis recently admitted last month for sacral decubitus with osteomyelitis.  She was discharged on 12/10 with a PICC line.  She was discharged on tigecycline and oral Levaquin.  Presented with complains of nausea vomiting diarrhea.  Patient had leukocytosis and noted to be hypotensive.  She was given fluid boluses.  C. difficile PCR was negative.  ID was consulted.  Patient was stabilized.  And then transferred to the floor.  Consultants: Infectious disease  Procedures: None  Antibiotics:  Tigecycline 12/03 >>12/24  Levaquin 12/03 >>12/24  Linezolid 12/24 >>12/29  Zosyn 12/24  >>12/31  Daptomycin 12/29 >>12/31  Eravacycline 12/31>>  Levaquin 1/1>>   HOSPITAL COURSE:    Chronic osteomyelitis/sepsis Patient presented with significantly elevated WBC, she was hypotensive, source of infection thought to be her sacral decubitus and osteomyelitis.  C. difficile was not detected on stool studies.  GI pathogen panel also unremarkable.  There is also possibility that she could have experienced side effects of her antibiotics.  Patient cultures have been negative so far. ID was consulted.  Blood pressure stabilized.  She was taken off of pressors.  Antibiotics were changed over per infectious disease recommendation.  Currently on levofloxacin and Earavacycline to be continued for 4 weeks.  Continue PICC line.  Infectious disease will arrange outpatient follow-up.  PICC line not to be removed until cleared to do so by infectious disease.   Secondary adrenal insufficiency She was given steroids.  Steroids were discontinued on 12/30.  Blood pressure remains stable.    Anemia of chronic disease/microcytic anemia/pancytopenia She did require 1 unit of transfusion during this hospitalization.  Hemoglobin has been stable. Pancytopenia is improved which was thought to be secondary to Linezolid.  Fecal occult blood testing was negative.  Apparently patient used to be on iron but could not tolerate it due to constipation.  Iron was resumed.  Diarrhea  Probably due to antibiotics.  C. difficile has been negative.  Continues to have loose stool.  Imodium will be prescribed.  Use with caution as the patient is susceptible to constipation.  As long as she has loose stools she will continue to benefit from a rectal tube/Flexi-Seal due to presence of sacral wound.  Acute kidney injury Resolved with IV fluids.  Hypocalcemia Calcium is being repleted.  Hypokalemia This has been repleted.  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  Wound care: Cleanse wound to left ischium with NS and pat dry. Apply NS moist gauze to wound bed.  Cover with 4x4 gauze and MEdipore tape. Change twice daily.  Paraplegia/transverse myelitis/multiple sclerosis These are chronic issues.  She is noted to be on multiple muscle relaxants.  Overall patient has been stable for the last many days.  We have been waiting on skilled nursing facility placement.  Diarrhea is an issue but can be monitored at skilled nursing facility.  Okay for discharge when SNF bed is available.     PERTINENT LABS:  The results of significant diagnostics from this hospitalization (including imaging, microbiology, ancillary and laboratory) are listed below for reference.    Microbiology: Recent Results (from the past 240 hour(s))  Urine culture     Status: Abnormal   Collection Time: 03/26/17  2:23 AM  Result Value Ref Range Status   Specimen Description URINE, RANDOM  Final   Special Requests NONE  Final   Culture >=100,000 COLONIES/mL YEAST (A)  Final   Report Status 03/27/2017 FINAL  Final  Blood Culture (routine x 2)     Status: None   Collection Time: 03/26/17  2:24 AM  Result Value Ref Range Status   Specimen Description BLOOD RIGHT WRIST  Final   Special Requests IN PEDIATRIC BOTTLE Blood Culture adequate volume  Final   Culture   Final    NO GROWTH 5 DAYS Performed at [facility], [place] [address] [place], [place] [address]    Report Status 03/31/2017 FINAL  Final  Blood Culture (routine x 2)     Status: None   Collection Time: 03/26/17  2:26 AM  Result Value Ref Range Status   Specimen Description BLOOD PICC LINE  Final   Special Requests   Final    BOTTLES DRAWN AEROBIC AND ANAEROBIC Blood Culture adequate volume   Culture   Final    NO GROWTH 5 DAYS Performed at [facility], [place] [address], [place], [place] [address]    Report Status 03/31/2017 FINAL  Final  Gastrointestinal Panel by PCR , Stool     Status: None    Collection Time: 03/26/17  5:46 AM  Result Value Ref Range Status   Campylobacter species NOT DETECTED NOT DETECTED Final   Plesimonas shigelloides NOT DETECTED NOT DETECTED Final   Salmonella species NOT DETECTED NOT DETECTED Final   Yersinia enterocolitica NOT DETECTED NOT DETECTED Final   Vibrio species NOT DETECTED NOT DETECTED Final   Vibrio cholerae NOT DETECTED NOT DETECTED Final   Enteroaggregative E coli (EAEC) NOT DETECTED NOT DETECTED Final   Enteropathogenic E coli (EPEC) NOT DETECTED NOT DETECTED Final   Enterotoxigenic E coli (ETEC) NOT DETECTED NOT DETECTED Final   Shiga like toxin producing E coli (STEC) NOT DETECTED NOT DETECTED Final   Shigella/Enteroinvasive E coli (EIEC) NOT DETECTED NOT DETECTED Final   Cryptosporidium NOT DETECTED NOT DETECTED Final   Cyclospora cayetanensis NOT DETECTED NOT DETECTED Final   Entamoeba histolytica NOT DETECTED NOT DETECTED Final   Giardia lamblia NOT DETECTED NOT DETECTED Final   Adenovirus F40/41 NOT DETECTED NOT DETECTED Final   Astrovirus NOT DETECTED NOT DETECTED Final   Norovirus GI/GII NOT DETECTED NOT DETECTED Final   Rotavirus A NOT DETECTED NOT DETECTED  Final   Sapovirus (I, II, IV, and V) NOT DETECTED NOT DETECTED Final    Comment: Performed at [facility], [place] [place], [place] [address]  C difficile quick scan w PCR reflex     Status: None   Collection Time: 03/26/17  5:46 AM  Result Value Ref Range Status   C Diff antigen NEGATIVE NEGATIVE Final   C Diff toxin NEGATIVE NEGATIVE Final   C Diff interpretation No C. difficile detected.  Final  MRSA PCR Screening     Status: None   Collection Time: 03/26/17  3:28 PM  Result Value Ref Range Status   MRSA by PCR NEGATIVE NEGATIVE Final    Comment:        The GeneXpert MRSA Assay (FDA approved for NASAL specimens only), is one component of a comprehensive MRSA colonization surveillance program. It is not intended to diagnose MRSA infection nor  to guide or monitor treatment for MRSA infections.      Labs: Basic Metabolic Panel: Recent Labs  Lab 03/27/17 1755 03/28/17 0323 03/29/17 0358 03/30/17 0500 03/31/17 0420 04/01/17 0440 04/02/17 0457  NA 141 137 140 141 142 141 138  K 2.1* 3.4* 3.4* 2.9* 3.4* 4.2 4.0  CL 111 106 109 110 111 110 105  CO2 23 26 26 28 27 28 31   GLUCOSE 236* 114* 104* 81 93 81 80  BUN 6 <5* <5* 5* 7 8 <5*  CREATININE 0.38* <0.30* <0.30* <0.30* <0.30* <0.30* <0.30*  CALCIUM 7.5* 7.3* 7.6* 7.3* 7.6* 7.6* 7.8*  MG 1.9 1.7 2.1  --   --   --   --   PHOS 1.0* 2.9 2.3*  --   --   --   --    CBC: Recent Labs  Lab 03/29/17 0358 03/30/17 0500 03/30/17 1740 03/31/17 0420 04/01/17 0440 04/02/17 0457  WBC 10.4 3.8*  --  5.8 6.1 6.6  NEUTROABS 7.9*  --   --   --   --   --   HGB 8.0* 6.6* 9.3* 8.5* 8.0* 8.7*  HCT 26.4* 22.3* 29.5* 26.9* 26.7* 28.1*  MCV 69.7* 70.3*  --  74.5* 75.0* 75.3*  PLT 205 135*  --  138* 146* 153   Cardiac Enzymes: Recent Labs  Lab 03/31/17 0420  CKTOTAL 22*   CBG: Recent Labs  Lab 03/27/17 2121  GLUCAP 164*     IMAGING STUDIES Dg Abd 1 View  Result Date: 03/05/2017 CLINICAL DATA:  Patient has experience nausea and vomiting last night and this morning. Patient is complaining of constipation and abdominal distention. The patient has multiple scleroses and paraplegia. EXAM: ABDOMEN - 1 VIEW COMPARISON:  Abdominal and pelvic CT scan of November 15, 2016 FINDINGS: The stool burden within the transverse, descending, and rectosigmoid portions of the colon is increased. There is no small bowel obstructive pattern. There surgical clips in the gallbladder fossa. There are no abnormal soft tissue calcifications. The bony structures exhibit no acute abnormalities. IMPRESSION: Increased colonic stool burden is consistent with constipation in the appropriate clinical setting. Otherwise no acute intra-abdominal abnormality is observed. Electronically Signed   By: David  Martinique M.D.    On: 03/05/2017 13:56   Ct Head Wo Contrast  Result Date: 03/26/2017 CLINICAL DATA:  37 y/o F; history of multiple sclerosis and paraplegia. Possible septic shock. EXAM: CT HEAD WITHOUT CONTRAST TECHNIQUE: Contiguous axial images were obtained from the base of the skull through the vertex without intravenous contrast. COMPARISON:  11/15/2016 MRI of the  head.  12/12/2009 CT of the head. FINDINGS: Brain: No evidence of acute infarction, hemorrhage, hydrocephalus, extra-axial collection or mass lesion/mass effect. Stable mild brain parenchymal volume loss. Vascular: No hyperdense vessel or unexpected calcification. Skull: Normal. Negative for fracture or focal lesion. Sinuses/Orbits: Partial opacification of bilateral maxillary sinuses with retention cysts. Otherwise negative. Other: None. IMPRESSION: 1. No acute intracranial abnormality identified. 2. Stable mild brain parenchymal volume loss. Electronically Signed   By: Kristine Garbe M.D.   On: 03/26/2017 22:06   Dg Chest Port 1 View  Result Date: 03/26/2017 CLINICAL DATA:  Acute onset of hypotension. EXAM: PORTABLE CHEST 1 VIEW COMPARISON:  Chest radiograph performed 03/07/2017 FINDINGS: The lungs are well-aerated and clear. There is no evidence of focal opacification, pleural effusion or pneumothorax. The cardiomediastinal silhouette is within normal limits. No acute osseous abnormalities are seen. A left PICC is noted ending about the distal SVC. IMPRESSION: No acute cardiopulmonary process seen. Electronically Signed   By: Garald Balding M.D.   On: 03/26/2017 04:04   Dg Chest Port 1 View  Result Date: 03/07/2017 CLINICAL DATA:  37 y/o  F; 1 hour of chest pain. EXAM: PORTABLE CHEST 1 VIEW COMPARISON:  11/14/2016 chest radiograph. FINDINGS: Stable heart size and mediastinal contours are within normal limits. Left central venous catheter tip projects over the lower SVC. Both lungs are clear. The visualized skeletal structures are  unremarkable. Right upper quadrant cholecystectomy clips. IMPRESSION: No active disease. Electronically Signed   By: Kristine Garbe M.D.   On: 03/07/2017 22:35    DISCHARGE EXAMINATION: Vitals:   04/02/17 0515 04/02/17 1254 04/02/17 2115 04/03/17 0633  BP: 122/78 105/62 134/81 118/83  Pulse: 88 97 (!) 111 98  Resp: 16 16 18 18   Temp: 98.4 F (36.9 C) 98.7 F (37.1 C) 98.6 F (37 C) 98.6 F (37 C)  TempSrc: Oral Oral Oral Oral  SpO2: 98% 97% 97% 97%  Weight:      Height:       General appearance: alert, cooperative, appears stated age and no distress Resp: clear to auscultation bilaterally Cardio: regular rate and rhythm, S1, S2 normal, no murmur, click, rub or gallop GI: soft, non-tender; bowel sounds normal; no masses,  no organomegaly  DISPOSITION: SNF  Discharge Instructions    Call MD for:  extreme fatigue   Complete by:  As directed    Call MD for:  persistant dizziness or light-headedness   Complete by:  As directed    Call MD for:  persistant nausea and vomiting   Complete by:  As directed    Call MD for:  severe uncontrolled pain   Complete by:  As directed    Call MD for:  temperature >100.4   Complete by:  As directed    Discharge instructions   Complete by:  As directed    Please review instructions on the discharge summary  You were cared for by a hospitalist during your hospital stay. If you have any questions about your discharge medications or the care you received while you were in the hospital after you are discharged, you can call the unit and asked to speak with the hospitalist on call if the hospitalist that took care of you is not available. Once you are discharged, your primary care physician will handle any further medical issues. Please note that NO REFILLS for any discharge medications will be authorized once you are discharged, as it is imperative that you return to your primary care physician (or establish  a relationship with a primary  care physician if you do not have one) for your aftercare needs so that they can reassess your need for medications and monitor your lab values. If you do not have a primary care physician, you can call [phone] for a physician referral.   Increase activity slowly   Complete by:  As directed         Allergies as of 04/03/2017      Reactions   Iodine Anaphylaxis   Latex Anaphylaxis   Polymyxin B Hives   Pt had hives in right arm with infusion of polymyxin on November 26th, 2018   Shellfish-derived Products Anaphylaxis   Reaction unknown   Vancomycin Anaphylaxis   Aspirin Other (See Comments)   Reaction unknown   Strawberry Extract Hives, Rash      Medication List    STOP taking these medications   lubiprostone 24 MCG capsule Commonly known as:  AMITIZA   senna-docusate 8.6-50 MG tablet Commonly known as:  Senokot-S   tigecycline 50 mg in sodium chloride 0.9 % 100 mL     TAKE these medications   acetaminophen 325 MG tablet Commonly known as:  TYLENOL Take 650 mg by mouth every 6 (six) hours as needed for fever.   baclofen 20 MG tablet Commonly known as:  LIORESAL Take 20 mg by mouth 3 (three) times daily.   bisacodyl 10 MG suppository Commonly known as:  DULCOLAX Place 1 suppository (10 mg total) daily rectally. What changed:    when to take this  reasons to take this   diazepam 5 MG tablet Commonly known as:  VALIUM Take 1-2 tablets (5-10 mg total) by mouth 3 (three) times daily. Take 1 tablet in AM, 1 tablet in PM, and 2 tablets at bedtime What changed:  when to take this   diphenhydrAMINE 25 mg capsule Commonly known as:  BENADRYL Take 1 capsule (25 mg total) by mouth every 8 (eight) hours as needed for itching. What changed:    how much to take  when to take this   ferrous sulfate 325 (65 FE) MG tablet Take 1 tablet (325 mg total) by mouth daily with breakfast. Start taking on:  04/04/2017   fluticasone 50 MCG/ACT nasal spray Commonly known as:   FLONASE Place 2 sprays into the nose daily as needed for allergies.   gabapentin 400 MG capsule Commonly known as:  NEURONTIN Take 2 capsules (800 mg total) by mouth 3 (three) times daily.   ibuprofen 200 MG tablet Commonly known as:  ADVIL,MOTRIN Take 200-400 mg by mouth every 6 (six) hours as needed for headache or moderate pain.   lamoTRIgine 25 MG tablet Commonly known as:  LAMICTAL Take 2 tablets (50 mg total) by mouth daily. What changed:  how much to take   levofloxacin 750 MG tablet Commonly known as:  LEVAQUIN Take 1 tablet (750 mg total) by mouth daily for 28 days. What changed:  additional instructions   loperamide 2 MG capsule Commonly known as:  IMODIUM Take 2 capsules (4 mg total) by mouth 3 (three) times daily as needed for diarrhea or loose stools.   loratadine 5 MG/5ML syrup Commonly known as:  CLARITIN Take 5 mg daily as needed by mouth for allergies.   omeprazole 40 MG capsule Commonly known as:  PRILOSEC Take 40 mg by mouth daily.   ondansetron 4 MG disintegrating tablet Commonly known as:  ZOFRAN-ODT Take 1 tablet (4 mg total) by mouth every 8 (eight) hours  as needed for nausea or vomiting. What changed:  when to take this   sertraline 100 MG tablet Commonly known as:  ZOLOFT Take 1 tablet (100 mg total) by mouth 2 (two) times daily. What changed:  when to take this   sodium chloride 0.9 % SOLN 150 mL with eravacycline 50 MG SOLR 68 mg Inject 68 mg into the vein every 12 (twelve) hours for 28 days. End date: Apr 29, 2017        Follow-up Information    Tommy Medal, Lavell Islam, MD. Schedule an appointment as soon as possible for a visit in 3 week(s).   Specialty:  Infectious Diseases Contact information: [address] [address] [phone]           TOTAL DISCHARGE TIME: 35 mins  Bonnielee Haff  [facility] Pager [phone]  04/03/2017, 1:01 PM

## 2017-04-03 NOTE — Progress Notes (Signed)
Report called to [facility],  Cloyd Stagers) at [phone]. All questions were answered. [facility] transported patient to [facility] facility.

## 2017-04-03 NOTE — Progress Notes (Signed)
Pt arrived at [facility], caregivers are saying they can not get Maura's antibiotic eravcycline. I advised them that [facility] was providing it for [place]. Caregivers at [facility] advised me that they would not be able to care for this patient without this medication. I stated I would page the Dr and call her back if I heard from him. Dr Maryland Pink called me, he is going to make some calls and see what he can do. [phone] is the return phone number for [facility] and [phone] is "Joaquim Lai" direct number.

## 2017-04-03 NOTE — Clinical Social Work Placement (Signed)
CSW and facility representatives Freda Munro and Karlene Einstein met with the patient and her mother at bedside and answered question presented about care at the facility.  Patient  And mother both agreeable to SNF PASRR and Medications provided. DC Summary sent Nurse given number for report.   [facility] called to transport.  5:00-5:30pick.   CLINICAL SOCIAL WORK PLACEMENT  NOTE  Date:  04/03/2017  Patient Details  Name: [name] MRN: [id number] Date of Birth: 1980/07/05  Clinical Social Work is seeking post-discharge placement for this patient at the Simms level of care (*CSW will initial, date and re-position this form in  chart as items are completed):  Yes   Patient/family provided with [facility] Work Department's list of facilities offering this level of care within the geographic area requested by the patient (or if unable, by the patient's family).  Yes   Patient/family informed of their freedom to choose among providers that offer the needed level of care, that participate in Medicare, Medicaid or managed care program needed by the patient, have an available bed and are willing to accept the patient.  Yes   Patient/family informed of [facility]'s ownership interest in [facility] and [facility], as well as of the fact that they are under no obligation to receive care at these facilities.  PASRR submitted to [facility] on 04/02/17     PASRR number received on 04/03/17     Existing PASRR number confirmed on       FL2 transmitted to all facilities in geographic area requested by pt/family on       FL2 transmitted to all facilities within larger geographic area on 04/03/17     Patient informed that his/her managed care company has contracts with or will negotiate with certain facilities, including the following:  [facility]     Yes   Patient/family informed of bed offers received.  Patient chooses bed at [facility]     Physician recommends and  patient chooses bed at      Patient to be transferred to [facility] on 04/03/17.  Patient to be transferred to facility by [facility]     Patient family notified on 04/03/17 of transfer.  Name of family member notified:  Mother at bedside.      PHYSICIAN       Additional Comment:    _______________________________________________ Lia Hopping, LCSW 04/03/2017, 4:10 PM

## 2017-04-03 NOTE — Discharge Instructions (Signed)
Bone and Joint Infections, Adult Bone infections (osteomyelitis) and joint infections (septic arthritis) occur when bacteria or other germs get inside a bone or a joint. This can happen if you have an infection in another part of your body that spreads through your blood. Germs from your skin or from outside of your body can also cause this type of infection if you have a wound or a broken bone (fracture) that breaks the skin. Anyone can get a bone infection or joint infection. You may be more likely to get this type of infection if you have a condition, such as diabetes, that lowers your ability to fight infection or increases your chances of getting an infection. Bone and joint infections can cause damage, and they can spread to other areas of your body. They need to be treated quickly. What are the causes? Most bone and joint infections are caused by bacteria. They can also be caused by other germs, such as viruses and funguses. What increases the risk? This condition is more likely to develop in:  People who recently had surgery, especially bone or joint surgery.  People who have a long-term (chronic) disease, such as: ? HIV (human immunodeficiency virus). ? Diabetes. ? Rheumatoid arthritis. ? Sickle cell anemia.  Elderly people.  People who take medicines that block or weaken the body's defense system (immune system).  People who have a condition that reduces their blood flow.  People who are on kidney dialysis.  People who have an artificial joint.  People who have had a joint or bone repaired with plates or screws (surgical hardware).  People who use or abuse IV drugs.  People who have had trauma, such as stepping on a nail.  What are the signs or symptoms? Symptoms vary depending on the type and location of your infection. Common symptoms of bone and joint infections include:  Fever and chills.  Redness and warmth.  Swelling.  Pain and stiffness.  Drainage of fluid  or pus near the infection.  Weight loss and fatigue.  Decreased ability to use a hand or foot.  How is this diagnosed? This condition may be diagnosed based on symptoms, medical history, a physical exam, and diagnostic tests. Tests can help to identify the cause of the infection. You may have various tests, such as:  A sample of tissue, fluid, or blood taken to be examined under a microscope.  A procedure to remove fluid from the infected joint with a needle (joint aspiration) for testing in a lab.  Pus or discharge swabbed from a wound for testing to identify germs and to determine what type of medicine will kill them (culture and sensitivity).  Blood tests to look for evidence of infection and inflammation (biomarkers).  Imaging studies to determine how severe the bone or joint infection is. These may include: ? X-rays. ? CT scan. ? MRI. ? Bone scan.  How is this treated? Treatment depends on the cause and type of infection. Antibiotic medicines are usually the first treatment for a bone or joint infection. Treatment with antibiotics may include:  Getting IV antibiotics. This may be done in a hospital at first. You may have to continue IV antibiotics at home for several weeks. You may also have to take antibiotics by mouth for several weeks after that.  Taking more than one kind of antibiotic. Treatment may start with a type of antibiotic that works against many different bacteria (broad spectrumantibiotics). IV antibiotics may be changed if tests show that another   type may work better.  Other treatments may include:  Draining fluid from the joint by placing a needle into it (aspiration).  Surgery to remove: ? Dead or dying tissue from a bone or joint. ? An infected artificial joint. ? Infected plates or screws that were used to repair a broken bone.  Follow these instructions at home:  Take medicines only as directed by your health care provider.  Take your antibiotic  medicine as directed by your health care provider. Finish the antibiotic even if you start to feel better.  Follow instructions from your health care provider about how to take IV antibiotics at home.  Ask your health care provider if you have any restrictions on your activities.  Keep all follow-up visits as directed by your health care provider. This is important. Contact a health care provider if:  You have a fever or chills.  You have redness, warmth, pain, or swelling that returns after treatment. Get help right away if:  You have rapid breathing or you have trouble breathing.  You have chest pain.  You cannot drink fluids or make urine.  The affected arm or leg swells, changes color, or turns blue. This information is not intended to replace advice given to you by your health care provider. Make sure you discuss any questions you have with your health care provider. Document Released: 03/19/2005 Document Revised: 08/25/2015 Document Reviewed: 03/17/2014 Elsevier Interactive Patient Education  2018 Elsevier Inc.  

## 2017-04-04 ENCOUNTER — Telehealth: Payer: Self-pay | Admitting: Infectious Disease

## 2017-04-04 NOTE — Telephone Encounter (Signed)
Spoke with Cloyd Stagers, nurse at [facility]. Patient is receiving the IV antibiotics q 12 hours, first dose today at 1500. She states [facility] is supplying this daily, as the medication expires 24 hours after mixing. Cloyd Stagers is asking for assistance in acquiring urostomy bags and rectal tubes. This RN stated I would relay that message back to the discharging team.  [name]: [phone] Landis Gandy, RN

## 2017-04-04 NOTE — Telephone Encounter (Signed)
PATIENT WAS INAPPROPRIATELY DC TO SNF WHERE THEY DO NO THAVE  Eravacycline WHICH SHE IS SUPPOSED TO BE RECEIVING  SHE NEEDS TO BE SENT BACK TO [facility] FOR READMISSION TO HOSPITALIST SRVICE AND TO REMAIN AT [place] UNTIL A SNF CAN BE FOUND THAT CAN ADMINISTER THIS MEDICATION  I CALLED [facility] DIRECT NUMBER [phone]   BUT SHE IS NOT THERE THEN WAS SENT TO THE [facility]ALLWAY WHERE THE PT RESIDES BUT NO ONE PICKED UP  I THEN CALLED THE NUMBER FOR [facility] [phone]  THREE MORE TIMES AND NO ONE PICKED UP  I THEN [employer] TE [phone] NUMBER AND SPOKE WITH SOMEONE  I WAS THEN PUT ON CONNECTION TO A NUMBER WHERE AGAIN NO ONE PICKED UP THE PHONE  CAN SOMEONE ELSE ALSO HELP OUT WITH THIS.  I DO NOT FEEL THAT THIS IS A MESS THAT I CREATED AND I DONT FEEL THAT IT IS ENTIRELY MY RESPONSIBILTY TO CLEAN THIS UP  I WILL CC MY CLINIC STAFF WHO HAVE LIKELY NEVER INTERACTED WITH THE PT AND STAFF AT [place] HOSPITALIST DR. [place] Pink WHO DC THE PT IS NOT ABLE TO DO ANYTHING AT THIS PONIT AS HE IS OFF AND DRIVING HIS CAR

## 2017-04-08 ENCOUNTER — Ambulatory Visit: Payer: Medicaid Other | Admitting: Obstetrics & Gynecology

## 2017-04-24 ENCOUNTER — Encounter: Payer: Self-pay | Admitting: Infectious Disease

## 2017-04-24 ENCOUNTER — Emergency Department (HOSPITAL_BASED_OUTPATIENT_CLINIC_OR_DEPARTMENT_OTHER)
Admit: 2017-04-24 | Discharge: 2017-04-24 | Disposition: A | Discharge status: Home / Self Care | Payer: Medicaid Other | Attending: Emergency Medicine | Admitting: Emergency Medicine

## 2017-04-24 ENCOUNTER — Inpatient Hospital Stay: Payer: Medicaid Other | Admitting: Infectious Disease

## 2017-04-24 ENCOUNTER — Encounter (HOSPITAL_COMMUNITY): Payer: Self-pay | Admitting: Emergency Medicine

## 2017-04-24 ENCOUNTER — Emergency Department (HOSPITAL_COMMUNITY): Payer: Medicaid Other

## 2017-04-24 ENCOUNTER — Telehealth: Payer: Self-pay | Admitting: Behavioral Health

## 2017-04-24 ENCOUNTER — Telehealth: Payer: Self-pay | Admitting: *Deleted

## 2017-04-24 ENCOUNTER — Ambulatory Visit (INDEPENDENT_AMBULATORY_CARE_PROVIDER_SITE_OTHER): Payer: Medicaid Other | Admitting: Infectious Disease

## 2017-04-24 ENCOUNTER — Inpatient Hospital Stay (HOSPITAL_COMMUNITY)
Admission: EM | Admit: 2017-04-24 | Discharge: 2017-04-30 | DRG: 314 | Disposition: A | Discharge status: Skilled Nursing Facility | Payer: Medicaid Other | Attending: Internal Medicine | Admitting: Internal Medicine

## 2017-04-24 VITALS — BP 100/60 | HR 88 | Resp 16

## 2017-04-24 DIAGNOSIS — M7989 Other specified soft tissue disorders: Secondary | ICD-10-CM

## 2017-04-24 DIAGNOSIS — F329 Major depressive disorder, single episode, unspecified: Secondary | ICD-10-CM | POA: Diagnosis present

## 2017-04-24 DIAGNOSIS — Z91013 Allergy to seafood: Secondary | ICD-10-CM

## 2017-04-24 DIAGNOSIS — G35D Multiple sclerosis, unspecified: Secondary | ICD-10-CM | POA: Diagnosis present

## 2017-04-24 DIAGNOSIS — L304 Erythema intertrigo: Secondary | ICD-10-CM

## 2017-04-24 DIAGNOSIS — G8220 Paraplegia, unspecified: Secondary | ICD-10-CM | POA: Diagnosis present

## 2017-04-24 DIAGNOSIS — Z1624 Resistance to multiple antibiotics: Secondary | ICD-10-CM | POA: Diagnosis not present

## 2017-04-24 DIAGNOSIS — Z91040 Latex allergy status: Secondary | ICD-10-CM

## 2017-04-24 DIAGNOSIS — A498 Other bacterial infections of unspecified site: Secondary | ICD-10-CM | POA: Diagnosis present

## 2017-04-24 DIAGNOSIS — Z9102 Food additives allergy status: Secondary | ICD-10-CM

## 2017-04-24 DIAGNOSIS — M869 Osteomyelitis, unspecified: Secondary | ICD-10-CM | POA: Diagnosis present

## 2017-04-24 DIAGNOSIS — K219 Gastro-esophageal reflux disease without esophagitis: Secondary | ICD-10-CM | POA: Diagnosis present

## 2017-04-24 DIAGNOSIS — A499 Bacterial infection, unspecified: Secondary | ICD-10-CM | POA: Diagnosis not present

## 2017-04-24 DIAGNOSIS — Z881 Allergy status to other antibiotic agents status: Secondary | ICD-10-CM

## 2017-04-24 DIAGNOSIS — Z91041 Radiographic dye allergy status: Secondary | ICD-10-CM

## 2017-04-24 DIAGNOSIS — I82A12 Acute embolism and thrombosis of left axillary vein: Secondary | ICD-10-CM | POA: Diagnosis present

## 2017-04-24 DIAGNOSIS — I82619 Acute embolism and thrombosis of superficial veins of unspecified upper extremity: Secondary | ICD-10-CM | POA: Diagnosis present

## 2017-04-24 DIAGNOSIS — Y848 Other medical procedures as the cause of abnormal reaction of the patient, or of later complication, without mention of misadventure at the time of the procedure: Secondary | ICD-10-CM | POA: Diagnosis present

## 2017-04-24 DIAGNOSIS — L89323 Pressure ulcer of left buttock, stage 3: Secondary | ICD-10-CM | POA: Diagnosis present

## 2017-04-24 DIAGNOSIS — Z8614 Personal history of Methicillin resistant Staphylococcus aureus infection: Secondary | ICD-10-CM

## 2017-04-24 DIAGNOSIS — G35 Multiple sclerosis: Secondary | ICD-10-CM | POA: Diagnosis present

## 2017-04-24 DIAGNOSIS — R918 Other nonspecific abnormal finding of lung field: Secondary | ICD-10-CM | POA: Diagnosis present

## 2017-04-24 DIAGNOSIS — I82C19 Acute embolism and thrombosis of unspecified internal jugular vein: Secondary | ICD-10-CM | POA: Diagnosis present

## 2017-04-24 DIAGNOSIS — Z936 Other artificial openings of urinary tract status: Secondary | ICD-10-CM

## 2017-04-24 DIAGNOSIS — I82622 Acute embolism and thrombosis of deep veins of left upper extremity: Secondary | ICD-10-CM | POA: Diagnosis present

## 2017-04-24 DIAGNOSIS — L89324 Pressure ulcer of left buttock, stage 4: Secondary | ICD-10-CM | POA: Diagnosis not present

## 2017-04-24 DIAGNOSIS — A4902 Methicillin resistant Staphylococcus aureus infection, unspecified site: Secondary | ICD-10-CM | POA: Diagnosis not present

## 2017-04-24 DIAGNOSIS — D649 Anemia, unspecified: Secondary | ICD-10-CM | POA: Diagnosis present

## 2017-04-24 DIAGNOSIS — T82868A Thrombosis of vascular prosthetic devices, implants and grafts, initial encounter: Principal | ICD-10-CM | POA: Diagnosis present

## 2017-04-24 DIAGNOSIS — I82890 Acute embolism and thrombosis of other specified veins: Secondary | ICD-10-CM | POA: Diagnosis present

## 2017-04-24 DIAGNOSIS — I82409 Acute embolism and thrombosis of unspecified deep veins of unspecified lower extremity: Secondary | ICD-10-CM | POA: Diagnosis present

## 2017-04-24 DIAGNOSIS — E876 Hypokalemia: Secondary | ICD-10-CM | POA: Diagnosis present

## 2017-04-24 DIAGNOSIS — G373 Acute transverse myelitis in demyelinating disease of central nervous system: Secondary | ICD-10-CM | POA: Diagnosis present

## 2017-04-24 DIAGNOSIS — Z886 Allergy status to analgesic agent status: Secondary | ICD-10-CM

## 2017-04-24 DIAGNOSIS — L89159 Pressure ulcer of sacral region, unspecified stage: Secondary | ICD-10-CM | POA: Diagnosis present

## 2017-04-24 HISTORY — DX: Erythema intertrigo: L30.4

## 2017-04-24 HISTORY — DX: Other specified soft tissue disorders: M79.89

## 2017-04-24 LAB — SEDIMENTATION RATE: Sed Rate: 0 mm/hr (ref 0–22)

## 2017-04-24 LAB — CBC WITH DIFFERENTIAL/PLATELET
Basophils Absolute: 0 10*3/uL (ref 0.0–0.1)
Basophils Relative: 0 %
EOS ABS: 0.1 10*3/uL (ref 0.0–0.7)
EOS PCT: 1 %
HCT: 35.9 % — ABNORMAL LOW (ref 36.0–46.0)
Hemoglobin: 10.7 g/dL — ABNORMAL LOW (ref 12.0–15.0)
Lymphocytes Relative: 21 %
Lymphs Abs: 1.8 10*3/uL (ref 0.7–4.0)
MCH: 24.4 pg — ABNORMAL LOW (ref 26.0–34.0)
MCHC: 29.8 g/dL — ABNORMAL LOW (ref 30.0–36.0)
MCV: 82 fL (ref 78.0–100.0)
MONO ABS: 0.5 10*3/uL (ref 0.1–1.0)
Monocytes Relative: 6 %
NEUTROS PCT: 72 %
Neutro Abs: 6.2 10*3/uL (ref 1.7–7.7)
PLATELETS: 165 10*3/uL (ref 150–400)
RBC: 4.38 MIL/uL (ref 3.87–5.11)
RDW: 27.1 % — ABNORMAL HIGH (ref 11.5–15.5)
WBC: 8.6 10*3/uL (ref 4.0–10.5)

## 2017-04-24 LAB — COMPREHENSIVE METABOLIC PANEL
ALBUMIN: 1.7 g/dL — AB (ref 3.5–5.0)
ALT: 25 U/L (ref 14–54)
ANION GAP: 9 (ref 5–15)
AST: 35 U/L (ref 15–41)
Alkaline Phosphatase: 304 U/L — ABNORMAL HIGH (ref 38–126)
BUN: 24 mg/dL — ABNORMAL HIGH (ref 6–20)
CHLORIDE: 106 mmol/L (ref 101–111)
CO2: 25 mmol/L (ref 22–32)
Calcium: 7.9 mg/dL — ABNORMAL LOW (ref 8.9–10.3)
Creatinine, Ser: 0.38 mg/dL — ABNORMAL LOW (ref 0.44–1.00)
GFR calc non Af Amer: >60 mL/min (ref >60)
GLUCOSE: 98 mg/dL (ref 65–99)
Potassium: 4.3 mmol/L (ref 3.5–5.1)
SODIUM: 140 mmol/L (ref 135–145)
Total Bilirubin: 0.6 mg/dL (ref 0.3–1.2)
Total Protein: 3.8 g/dL — ABNORMAL LOW (ref 6.5–8.1)

## 2017-04-24 LAB — PROTIME-INR
INR: 1.13
PROTHROMBIN TIME: 14.4 s (ref 11.4–15.2)

## 2017-04-24 LAB — C-REACTIVE PROTEIN: CRP: 1.1 mg/dL — AB (ref <1.0)

## 2017-04-24 MED ORDER — LAMOTRIGINE 25 MG PO TABS
50.0000 mg | ORAL_TABLET | Freq: Every day | ORAL | Status: DC
  Administered 2017-04-25 – 2017-04-30 (×6): 50 mg via ORAL
  Filled 2017-04-24 (×6): qty 2

## 2017-04-24 MED ORDER — NYSTATIN 100000 UNIT/GM EX CREA: 1.0000 "application " | TOPICAL_CREAM | Freq: Three times a day (TID) | CUTANEOUS | 2 refills | Status: DC

## 2017-04-24 MED ORDER — DIAZEPAM 5 MG PO TABS
10.0000 mg | ORAL_TABLET | Freq: Every day | ORAL | Status: DC
  Administered 2017-04-25 – 2017-04-29 (×6): 10 mg via ORAL
  Filled 2017-04-24 (×6): qty 2

## 2017-04-24 MED ORDER — BISACODYL 10 MG RE SUPP: 10.0000 mg | Freq: Every day | RECTAL | Status: DC | PRN

## 2017-04-24 MED ORDER — ACETAMINOPHEN 325 MG PO TABS: 650.0000 mg | ORAL_TABLET | Freq: Four times a day (QID) | ORAL | Status: DC | PRN

## 2017-04-24 MED ORDER — GABAPENTIN 400 MG PO CAPS
800.0000 mg | ORAL_CAPSULE | Freq: Three times a day (TID) | ORAL | Status: DC
  Administered 2017-04-25 – 2017-04-30 (×17): 800 mg via ORAL
  Filled 2017-04-24 (×17): qty 2

## 2017-04-24 MED ORDER — FERROUS SULFATE 325 (65 FE) MG PO TABS
325.0000 mg | ORAL_TABLET | Freq: Every day | ORAL | Status: DC
  Administered 2017-04-25 – 2017-04-30 (×6): 325 mg via ORAL
  Filled 2017-04-24 (×6): qty 1

## 2017-04-24 MED ORDER — NYSTATIN 100000 UNIT/GM EX CREA
1.0000 "application " | TOPICAL_CREAM | Freq: Three times a day (TID) | CUTANEOUS | Status: DC
  Administered 2017-04-25 – 2017-04-29 (×16): 1 via TOPICAL
  Filled 2017-04-24 (×2): qty 15

## 2017-04-24 MED ORDER — RIVAROXABAN 20 MG PO TABS
20.0000 mg | ORAL_TABLET | Freq: Every day | ORAL | Status: DC
Start: 2017-05-16 — End: 2017-04-30

## 2017-04-24 MED ORDER — LEVOFLOXACIN 750 MG PO TABS
750.0000 mg | ORAL_TABLET | Freq: Every day | ORAL | Status: AC
  Administered 2017-04-25 – 2017-04-29 (×5): 750 mg via ORAL
  Filled 2017-04-24 (×5): qty 1

## 2017-04-24 MED ORDER — FLUTICASONE PROPIONATE 50 MCG/ACT NA SUSP
2.0000 | Freq: Every day | NASAL | Status: DC | PRN
  Administered 2017-04-28: 2 via NASAL
  Filled 2017-04-24: qty 16

## 2017-04-24 MED ORDER — SODIUM CHLORIDE 0.9 % IV SOLN
68.0000 mg | Freq: Two times a day (BID) | INTRAVENOUS | Status: AC
  Administered 2017-04-25 – 2017-04-29 (×11): 68 mg via INTRAVENOUS
  Filled 2017-04-24 (×13): qty 6.8

## 2017-04-24 MED ORDER — PANTOPRAZOLE SODIUM 40 MG PO TBEC
40.0000 mg | DELAYED_RELEASE_TABLET | Freq: Every day | ORAL | Status: DC
  Administered 2017-04-25 – 2017-04-30 (×6): 40 mg via ORAL
  Filled 2017-04-24 (×6): qty 1

## 2017-04-24 MED ORDER — RIVAROXABAN 15 MG PO TABS
15.0000 mg | ORAL_TABLET | Freq: Two times a day (BID) | ORAL | Status: DC
  Administered 2017-04-24 – 2017-04-30 (×12): 15 mg via ORAL
  Filled 2017-04-24 (×13): qty 1

## 2017-04-24 MED ORDER — ONDANSETRON 4 MG PO TBDP
4.0000 mg | ORAL_TABLET | Freq: Three times a day (TID) | ORAL | Status: DC | PRN
  Filled 2017-04-24: qty 1

## 2017-04-24 MED ORDER — DIAZEPAM 5 MG PO TABS: 5.0000 mg | ORAL_TABLET | Freq: Three times a day (TID) | ORAL | Status: DC

## 2017-04-24 MED ORDER — BACLOFEN 20 MG PO TABS
20.0000 mg | ORAL_TABLET | Freq: Three times a day (TID) | ORAL | Status: DC
  Administered 2017-04-25 – 2017-04-30 (×17): 20 mg via ORAL
  Filled 2017-04-24 (×18): qty 1

## 2017-04-24 MED ORDER — DIPHENHYDRAMINE HCL 25 MG PO CAPS: 25.0000 mg | ORAL_CAPSULE | Freq: Three times a day (TID) | ORAL | Status: DC | PRN

## 2017-04-24 MED ORDER — LORATADINE 10 MG PO TABS: 5.0000 mg | ORAL_TABLET | Freq: Every day | ORAL | Status: DC | PRN

## 2017-04-24 MED ORDER — DIAZEPAM 5 MG PO TABS
5.0000 mg | ORAL_TABLET | Freq: Two times a day (BID) | ORAL | Status: DC
Start: 2017-04-25 — End: 2017-04-30
  Administered 2017-04-25 – 2017-04-30 (×11): 5 mg via ORAL
  Filled 2017-04-24 (×11): qty 1

## 2017-04-24 MED ORDER — TRAMADOL HCL 50 MG PO TABS: 50.0000 mg | ORAL_TABLET | Freq: Four times a day (QID) | ORAL | Status: DC | PRN

## 2017-04-24 MED ORDER — SODIUM CHLORIDE 0.9 % IV SOLN
INTRAVENOUS | Status: DC
  Administered 2017-04-25 – 2017-04-29 (×7): via INTRAVENOUS

## 2017-04-24 MED ORDER — LOPERAMIDE HCL 2 MG PO CAPS
4.0000 mg | ORAL_CAPSULE | Freq: Three times a day (TID) | ORAL | Status: DC | PRN
  Administered 2017-04-25 – 2017-04-29 (×5): 4 mg via ORAL
  Filled 2017-04-24 (×6): qty 2

## 2017-04-24 MED ORDER — SERTRALINE HCL 100 MG PO TABS
100.0000 mg | ORAL_TABLET | Freq: Two times a day (BID) | ORAL | Status: DC
  Administered 2017-04-25 – 2017-04-30 (×12): 100 mg via ORAL
  Filled 2017-04-24 (×12): qty 1

## 2017-04-24 NOTE — Telephone Encounter (Signed)
Called patient's mother at her request to let her know that Zamiyah is being transported to [facility] ER per Dr Tommy Medal due to swelling in her upper left extremity at [facility] to rule out DVT.  Patient's mother verbalized understanding.

## 2017-04-24 NOTE — Progress Notes (Signed)
Left upper extremity venous duplex has been completed. There is evidence of age-indeterminate deep vein thrombosis involving the internal jugular, subclavian, axillary, and brachial veins of the left upper extremity.  There is also evidence of age-indeterminate superficial vein thrombosis involving the basilic vein of the left upper extremity. Results were given to Dr. Laverta Baltimore.  04/24/17 5:32 PM [name] RVT

## 2017-04-24 NOTE — Patient Instructions (Signed)
If your labs are encouraring we will stop the IV antibiotics on January 28th and PICC can be pulled  I would start topical nystatin for yeast infection in groin

## 2017-04-24 NOTE — ED Notes (Signed)
IV team at bedside. PICC line removed.

## 2017-04-24 NOTE — Progress Notes (Addendum)
Subjective:   Cc: intertrigo in groin   Patient ID: [name], female    DOB: 1980/05/07, 37 y.o.   MRN: [id number]  HPI  37 y.o. 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 I switched her to Eravacycline and levaquin.  She is nearly done with 4 weeks of therarpy with these drugs and per WOC at facility having improvement in appearance of granulation tissue. She is no longer having rectal tube but still having trouble with loose stools with incontinence and wound was soiled with stool when we examined it today. She also had inguinal intertrigo.  Past Medical History:  Diagnosis Date  . Bladder stones    Hx of  . Depression   . GERD (gastroesophageal reflux disease)   . Intertrigo 04/24/2017  . Multiple sclerosis ([place])   . Paraplegia ([place])   . PTSD (post-traumatic stress disorder)   . Transverse myelitis [facility])     Past Surgical History:  Procedure Laterality Date  . CHOLECYSTECTOMY    . CYSTECTOMY W/ URETEROILEAL CONDUIT    . IR FLUORO GUIDE CV LINE LEFT  02/25/2017  . IR US GUIDE VASC ACCESS LEFT  02/25/2017  . IRRIGATION AND DEBRIDEMENT ABSCESS N/A 02/18/2017   Procedure: IRRIGATION AND DEBRIDEMENT ABSCESS;  Surgeon: Excell Seltzer, MD;  Location: [facility] ORS;  Service: General;  Laterality: N/A;  . TONSILLECTOMY    . WISDOM TOOTH EXTRACTION      Family History  Problem Relation Age of Onset  . Diabetes type II Father   . CAD Father   . Dementia Father   . Stroke Father   . Seizures Father   . Diabetes type II Mother   . Hypertension Mother   . Breast cancer Other   . Diabetes type II Other       Social History   Socioeconomic History  . Marital status: Single    Spouse name: None  . Number of children: None  . Years of education: None  . Highest education level: None    Social Needs  . Financial resource strain: None  . Food insecurity - worry: None  . Food insecurity - inability: None  . Transportation needs - medical: None  . Transportation needs - non-medical: None  Occupational History  . None  Tobacco Use  . Smoking status: Never Smoker  . Smokeless tobacco: Never Used  Substance and Sexual Activity  . Alcohol use: No  . Drug use: No  . Sexual activity: No  Other Topics Concern  . None  Social History Narrative   Lives with parents.  All disabled.  Attended [employer] until she got transverse myelitis.    Allergies  Allergen Reactions  . Iodine Anaphylaxis  . Latex Anaphylaxis  . Polymyxin B Hives    Pt had hives in right arm with infusion of polymyxin on November 26th, 2018  . Shellfish-Derived Products Anaphylaxis    Reaction unknown  . Vancomycin Anaphylaxis  . Aspirin Other (See Comments)    Reaction unknown  . Strawberry Extract Hives and Rash     Current Outpatient Medications:  .  acetaminophen (TYLENOL) 325 MG tablet, Take 650 mg by mouth every 6 (six) hours as needed for fever., Disp: , Rfl:  .  bisacodyl (DULCOLAX) 10 MG suppository, Place 1 suppository (10 mg total) daily rectally. (Patient taking differently: Place 10 mg rectally  daily as needed for mild constipation. ), Disp: 12 suppository, Rfl: 0 .  diazepam (VALIUM) 5 MG tablet, Take 1-2 tablets (5-10 mg total) by mouth 3 (three) times daily. Take 1 tablet in AM, 1 tablet in PM, and 2 tablets at bedtime, Disp: 30 tablet, Rfl: 0 .  diphenhydrAMINE (BENADRYL) 25 mg capsule, Take 1 capsule (25 mg total) by mouth every 8 (eight) hours as needed for itching. (Patient taking differently: Take 50 mg by mouth every 12 (twelve) hours. ), Disp: 30 capsule, Rfl: 0 .  ferrous sulfate 325 (65 FE) MG tablet, Take 1 tablet (325 mg total) by mouth daily with breakfast., Disp: 30 tablet, Rfl: 3 .  fluticasone (FLONASE) 50 MCG/ACT nasal spray, Place 2 sprays into the nose daily as needed for  allergies. , Disp: , Rfl:  .  gabapentin (NEURONTIN) 400 MG capsule, Take 2 capsules (800 mg total) by mouth 3 (three) times daily., Disp: 180 capsule, Rfl: 8 .  ibuprofen (ADVIL,MOTRIN) 200 MG tablet, Take 200-400 mg by mouth every 6 (six) hours as needed for headache or moderate pain. , Disp: , Rfl:  .  lamoTRIgine (LAMICTAL) 25 MG tablet, Take 2 tablets (50 mg total) by mouth daily., Disp: 60 tablet, Rfl: 0 .  levofloxacin (LEVAQUIN) 750 MG tablet, Take 1 tablet (750 mg total) by mouth daily for 28 days., Disp: 28 tablet, Rfl: 0 .  loperamide (IMODIUM) 2 MG capsule, Take 2 capsules (4 mg total) by mouth 3 (three) times daily as needed for diarrhea or loose stools., Disp: 30 capsule, Rfl: 0 .  loratadine (CLARITIN) 5 MG/5ML syrup, Take 5 mg daily as needed by mouth for allergies. , Disp: , Rfl:  .  omeprazole (PRILOSEC) 40 MG capsule, Take 40 mg by mouth daily., Disp: , Rfl:  .  ondansetron (ZOFRAN-ODT) 4 MG disintegrating tablet, Take 1 tablet (4 mg total) by mouth every 8 (eight) hours as needed for nausea or vomiting. (Patient taking differently: Take 4 mg by mouth every 12 (twelve) hours. ), Disp: 20 tablet, Rfl: 0 .  sertraline (ZOLOFT) 100 MG tablet, Take 1 tablet (100 mg total) by mouth 2 (two) times daily., Disp: 60 tablet, Rfl: 0 .  sodium chloride 0.9 % SOLN 150 mL with eravacycline 50 MG SOLR 68 mg, Inject 68 mg into the vein every 12 (twelve) hours for 28 days. End date: Apr 29, 2017, Disp: 69 each, Rfl: 0 .  baclofen (LIORESAL) 20 MG tablet, Take 20 mg by mouth 3 (three) times daily., Disp: , Rfl:  .  nystatin cream (MYCOSTATIN), Apply 1 application topically 3 (three) times daily., Disp: 30 g, Rfl: 2   Review of Systems  Constitutional: Negative for activity change, appetite change, diaphoresis, fever and unexpected weight change.  HENT: Negative for congestion, dental problem, facial swelling and hearing loss.   Respiratory: Negative for apnea, cough, shortness of breath, wheezing  and stridor.   Cardiovascular: Negative for leg swelling.  Gastrointestinal: Positive for diarrhea. Negative for nausea.  Skin: Positive for rash and wound.  Neurological: Negative for seizures and syncope.  Psychiatric/Behavioral: Negative for agitation.       Objective:   Physical Exam  Constitutional: She is oriented to person, place, and time. She appears well-developed and well-nourished.  HENT:  Head: Normocephalic.  Mouth/Throat: Oropharynx is clear and moist. Dental caries present. No oropharyngeal exudate.  Cardiovascular: Normal rate and regular rhythm.  Pulmonary/Chest: Effort normal.  Abdominal: Soft.  Neurological: She is alert and oriented to person, place,  and time.  Psychiatric: She has a normal mood and affect. Her behavior is normal. Judgment and thought content normal.   quadraplegia  Wound 04/24/17: granulation tissue but  Also can palpate bone underneath. It is not visible thoguh which is  Good and there is no clear purulence           Assessment & Plan:   37 y.o. 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 She claims she is not safe candidate for diversion colostomy  We spent greater than 25 minutes with the patient including greater than 50% of time in face to face counsel of the patient re nature of pelvic osteo and this MDR organism and in coordination of her care with ambulance, EMS workers, Conservation officer, historic buildings and SNF.   Pt had marked edema noted by RN staff with exam of PICC  I confirmed this edema and think she will need to go to ED for duplex to rule  out DVT

## 2017-04-24 NOTE — Addendum Note (Signed)
Addended by: Alcide Evener N on: 04/24/2017 03:01 PM   Modules accepted: Orders

## 2017-04-24 NOTE — ED Notes (Signed)
Patient transported to X-ray 

## 2017-04-24 NOTE — Telephone Encounter (Signed)
[place] and spoke to Dayle Points RN who is the nurse taking care of [name].  I informed her that Ms. Cheek was transported to [facility] ED to r/o DVT in her left arm due to swelling noted upon her visit with [place] today.  Cloyd Stagers RN verbalized understanding.

## 2017-04-24 NOTE — ED Notes (Signed)
Patient denies pain and is resting comfortably.  

## 2017-04-24 NOTE — Progress Notes (Signed)
  [name] was sent to the ED today from my clinic due to suspicion of DVT which was confirmed.  She is currently on a eravacycline IV and oral levaquin.  she was due to finish her abx for MRSA and XDR Acinetobacter on Monday the 28th.  I DO NOT FEEL THAT SHE NEEDS A PICC LINE or to get her antibiotics through Monday.  Instead I would continue the antibiotics which she is an inpatient and when she is ready for  DC simply stop them. Her wound seemed improved on exam and her CRP is down trending  She will undoubtedly have recurrence of this problem but I think she is stable to observe off antibiotics for now  One could consider giving her a course of fluconazole for her intertrigo when she stops her antibacterial abx.  I am available by phone for questions tomorrow.

## 2017-04-24 NOTE — ED Provider Notes (Signed)
Emergency Department Provider Note   I have reviewed the triage vital signs and the nursing notes.   HISTORY  Chief Complaint Vascular Access Problem   HPI [name] is a 37 y.o. female with PMH of paraplegia, MRSA, and left decubitus ulceration currently in rehab with PICC for IV abx since to the emergency department from her infectious disease physician's office with left arm swelling and report of nonfunctioning PICC line.  Patient states that at rehab the line has been infusing normally including last night when she had antibiotics.  She is noticed some left arm swelling worsening slightly over the past several days.  When she went to the infectious disease office today they attempted to draw blood from both ports but were unsuccessful.  Patient was ultimately transferred to the emergency department for evaluation of the PICC line and rule out of blood clot.  Patient denies any fevers or chills.  No numbness or tingling in the left upper extremity.  No radiation of symptoms or modifying factors.   Past Medical History:  Diagnosis Date  . Bladder stones    Hx of  . Depression   . GERD (gastroesophageal reflux disease)   . Intertrigo 04/24/2017  . Left upper extremity swelling 04/24/2017  . Multiple sclerosis ([place])   . Paraplegia ([place])   . PTSD (post-traumatic stress disorder)   . Transverse myelitis [facility])     Patient Active Problem List   Diagnosis Date Noted  . Intertrigo 04/24/2017  . Left upper extremity swelling 04/24/2017  . DVT (deep venous thrombosis) ([place]) 04/24/2017  . Hypotension due to hypovolemia   . Thrombocytopenia ([place])   . Acute kidney injury (nontraumatic) ([place])   . Sepsis associated hypotension ([place]) 03/26/2017  . AKI (acute kidney injury) ([place]) 03/26/2017  . Somnolence   . Facial droop   . MDR Acinetobacter baumannii infection   . Acinetobacter lwoffi infection   . Proteus infection   . MRSA infection   . Decubitus ulcer of left ischial  area, stage III ([place]) 11/15/2016  . Diarrhea 07/17/2016  . 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 Constipation 10/29/2014  . Multiple sclerosis ([place]) 10/16/2011  . Paraplegia ([place])   . History of transverse myelitis     Past Surgical History:  Procedure Laterality Date  . CHOLECYSTECTOMY    . CYSTECTOMY W/ URETEROILEAL CONDUIT    . IR FLUORO GUIDE CV LINE LEFT  02/25/2017  . IR US GUIDE VASC ACCESS LEFT  02/25/2017  . IRRIGATION AND DEBRIDEMENT ABSCESS N/A 02/18/2017   Procedure: IRRIGATION AND DEBRIDEMENT ABSCESS;  Surgeon: Excell Seltzer, MD;  Location: [facility];  Service: General;  Laterality: N/A;  . TONSILLECTOMY    . WISDOM TOOTH EXTRACTION        Allergies Iodine; Latex; Polymyxin b; Shellfish-derived products; Vancomycin; Aspirin; and Strawberry extract  Family History  Problem Relation Age of Onset  . Diabetes type II Father   . CAD Father   . Dementia Father   . Stroke Father   . Seizures Father   . Diabetes type II Mother   . Hypertension Mother   . Breast cancer Other   . Diabetes type II Other     Social History Social History   Tobacco Use  . Smoking status: Never Smoker  . Smokeless tobacco: Never Used  Substance Use Topics  . Alcohol use: No  . Drug use: No    Review of Systems  Constitutional: No fever/chills Eyes: No visual changes. ENT: No sore  throat. Cardiovascular: Denies chest pain. Positive PICC line complication.  Respiratory: Denies shortness of breath. Gastrointestinal: No abdominal pain.  No nausea, no vomiting.  No diarrhea.  No constipation. Genitourinary: Negative for dysuria. Musculoskeletal: Negative for back pain. Positive LUE swelling.  Skin: Negative for rash. Neurological: Negative for headaches, focal weakness or numbness.  10-point ROS otherwise negative.  ____________________________________________   PHYSICAL EXAM:  VITAL SIGNS: Vitals:   04/24/17 2100 04/24/17 2237  BP: (!) 94/57 103/65  Pulse: 96  97  Resp:  17  Temp:  98 F (36.7 C)  SpO2: 98% 100%    Constitutional: Alert and oriented. Chronically ill-appearing but in no acute distress.  Eyes: Conjunctivae are normal. Head: Atraumatic. Nose: No congestion/rhinnorhea. Mouth/Throat: Mucous membranes are moist. Neck: No stridor.   Cardiovascular: Normal rate, regular rhythm. Good peripheral circulation. Grossly normal heart sounds.   Respiratory: Normal respiratory effort.  No retractions. Lungs CTAB. Gastrointestinal: Soft and nontender. No distention.  Musculoskeletal: No lower extremity tenderness nor edema. No gross deformities of extremities. Positive edema in the LUE with mild redness of the arm. No erythema at the PICC insertion site itself.  Neurologic:  Normal speech and language. Paraplegia.  Skin:  Skin is warm and dry.    ____________________________________________   LABS (all labs ordered are listed, but only abnormal results are displayed)  Labs Reviewed  CBC WITH DIFFERENTIAL/PLATELET - Abnormal; Notable for the following components:      Result Value   Hemoglobin 10.7 (*)    HCT 35.9 (*)    MCH 24.4 (*)    MCHC 29.8 (*)    RDW 27.1 (*)    All other components within normal limits  C-REACTIVE PROTEIN - Abnormal; Notable for the following components:   CRP 1.1 (*)    All other components within normal limits  MRSA PCR SCREENING  SEDIMENTATION RATE  BASIC METABOLIC PANEL  CBC   ____________________________________________  RADIOLOGY  Dg Chest 2 View  Result Date: 04/24/2017 CLINICAL DATA:  LEFT arm swelling.  Assess PICC line placement. EXAM: CHEST  2 VIEW COMPARISON:  Chest radiograph March 26, 2017 FINDINGS: Patient rotated to the LEFT. Cardiomediastinal silhouette is normal. LEFT mid and lower lung zone bandlike densities. Blunting of the LEFT costophrenic angle. Retrocardiac consolidation silhouetting the diaphragm with air bronchograms. No pneumothorax. LEFT PICC distal tip projects in  proximal superior vena cava, potentially slightly retracted from prior examination. Punctate calcifications LEFT supraclavicular fossa. Osseous structures are non suspicious. IMPRESSION: LEFT PICC distal tip projects in proximal superior vena cava. Retrocardiac consolidation concerning for pneumonia. LEFT mid and lower lung zone atelectasis/scarring with small LEFT pleural effusion versus pleural thickening. Electronically Signed   By: Elon Alas M.D.   On: 04/24/2017 16:55    ____________________________________________   PROCEDURES  Procedure(s) performed:   Procedures  None ____________________________________________   INITIAL IMPRESSION / ASSESSMENT AND PLAN / ED COURSE  Pertinent labs & imaging results that were available during my care of the patient were reviewed by me and considered in my medical decision making (see chart for details).  Patient presents to the emergency department for evaluation of PICC line complication.  She is in rehab and states that at her rehab facility the line is been functioning properly.  The infectious disease providers were unable to draw labs from the line today in Center for evaluation of possible clot given left upper extremity swelling and mild redness.  The redness is not immediately around the PICC insertion site.  Plan for  venous duplex of the left upper extremity, labs, and chest x-ray to assess the tip position of the PICC line. IV team consulted.   05:30 PM Patient positive for DVT in the left brachiocephalic to the left IVC. PICC initially placed by [facility]IR at [facility]. Plan to discuss with vascular.    06:15 PM Spoke with Dr. Oneida Alar with Vascular Surgery. States that the PICC needs to be pulled ASAP and patient started on anticoagulation. Will try to coordinate with VIR to have the line replaced on the left.   06:36 PM Spoke with Dr. Tommy Medal with ID. He agrees with continuing abx through IV now. Will add ESR and CRP. He will see in the AM.  If those are normal the patient was due to stop IV abx next week and he may consider stopping early and avoid additional PICC. Will comment tomorrow. I have added labs.   Discussed patient's case with Hospitalist to request admission. Patient and family (if present) updated with plan. Care transferred to Hospitalist service.  I reviewed all nursing notes, vitals, pertinent old records, EKGs, labs, imaging (as available).  ____________________________________________  FINAL CLINICAL IMPRESSION(S) / ED DIAGNOSES  Final diagnoses:  Acute deep vein thrombosis (DVT) of left upper extremity, unspecified vein (HCC)     MEDICATIONS GIVEN DURING THIS VISIT:  Medications  Rivaroxaban (XARELTO) tablet 15 mg (15 mg Oral Given 04/24/17 2130)  rivaroxaban (XARELTO) tablet 20 mg (not administered)  acetaminophen (TYLENOL) tablet 650 mg (not administered)  baclofen (LIORESAL) tablet 20 mg (not administered)  bisacodyl (DULCOLAX) suppository 10 mg (not administered)  diphenhydrAMINE (BENADRYL) capsule 25 mg (not administered)  ferrous sulfate tablet 325 mg (not administered)  fluticasone (FLONASE) 50 MCG/ACT nasal spray 2 spray (not administered)  gabapentin (NEURONTIN) capsule 800 mg (not administered)  lamoTRIgine (LAMICTAL) tablet 50 mg (not administered)  levofloxacin (LEVAQUIN) tablet 750 mg (not administered)  loperamide (IMODIUM) capsule 4 mg (not administered)  loratadine (CLARITIN) tablet 5 mg (not administered)  nystatin cream (MYCOSTATIN) 1 application (not administered)  pantoprazole (PROTONIX) EC tablet 40 mg (not administered)  ondansetron (ZOFRAN-ODT) disintegrating tablet 4 mg (not administered)  sertraline (ZOLOFT) tablet 100 mg (not administered)  eravacycline (XERAVA) 68 mg in sodium chloride 0.9 % 150 mL IVPB (not administered)  0.9 %  sodium chloride infusion (not administered)  traMADol (ULTRAM) tablet 50 mg (not administered)  diazepam (VALIUM) tablet 5 mg (not  administered)    And  diazepam (VALIUM) tablet 10 mg (not administered)    Note:  This document was prepared using Dragon voice recognition software and may include unintentional dictation errors.  Nanda Quinton, MD Emergency Medicine   Long, Wonda Olds, MD 04/24/17 [phone]

## 2017-04-24 NOTE — ED Triage Notes (Signed)
Patient presents via [facility] from wound care appointment. Patient is a resident at [facility]. Medical provider unable to draw labs off of PICC placement. Left are swollen and warm to touch in area. Pulse intact bilaterally.

## 2017-04-24 NOTE — Progress Notes (Signed)
ANTICOAGULATION CONSULT NOTE - Initial Consult  Pharmacy Consult for Xarelto Indication: DVT  Allergies  Allergen Reactions  . Iodine Anaphylaxis  . Latex Anaphylaxis  . Polymyxin B Hives    Pt had hives in right arm with infusion of polymyxin on November 26th, 2018  . Shellfish-Derived Products Anaphylaxis    Reaction unknown  . Vancomycin Anaphylaxis  . Aspirin Other (See Comments)    Reaction unknown  . Strawberry Extract Hives and Rash    Patient Measurements: Height: 5\' 3"  (160 cm) Weight: 150 lb (68 kg) IBW/kg (Calculated) : 52.4  Vital Signs: Temp: 98 F (36.7 C) (01/23 1546) BP: 95/58 (01/23 1800) Pulse Rate: 101 (01/23 1800)  Labs: Recent Labs    04/24/17 1702 04/24/17 1739  HGB 10.7*  --   HCT 35.9*  --   PLT PENDING  --   LABPROT  --  14.4  INR  --  1.13    CrCl cannot be calculated (Patient's most recent lab result is older than the maximum 21 days allowed.).   Medical History: Past Medical History:  Diagnosis Date  . Bladder stones    Hx of  . Depression   . GERD (gastroesophageal reflux disease)   . Intertrigo 04/24/2017  . Left upper extremity swelling 04/24/2017  . Multiple sclerosis ([place])   . Paraplegia ([place])   . PTSD (post-traumatic stress disorder)   . Transverse myelitis ([place])     Medications:   (Not in a hospital admission)  Assessment: 44 YOF with a recent hospital discharge on IV antibiotics for for sacral decubitus osteomyelitis. Presents to the ED for evaluation of swelling of left arm and found to have a DVT of indeterminate age. Pharmacy consulted to start rivaroxaban. H/H low, Plt 165. No anticoag prior to admission   Goal of Therapy:  stroke prevention Monitor platelets by anticoagulation protocol: Yes   Plan:  -Start rivaroxaban 15 mg twice daily x 21 days, then decrease to rivaroxaban 20 mg daily  -Monitor renal fx, CBC and s/s of bleeding  Albertina Parr, PharmD., BCPS Clinical Pharmacist Pager  [phone]

## 2017-04-24 NOTE — H&P (Signed)
History and Physical    TELINA KLECKLEY GGY:[id number] DOB: 11/01/1980 DOA: 04/24/2017  PCP: Lennie Odor, PA-C  Patient coming from: SNF  I have personally briefly reviewed patient's old medical records in [place]  Chief Complaint: arm swelling.   HPI: [name] is a 37 y.o. female with medical history significant of Paraplegia, MS, transverse myelitis, decubitus ulcer left ischial area IV, Osteomyelitis, MDR Acinetobacter, recently discharge from hospital 04-03-2017 treated for sepsis secondary to sacral decubitus osteomyelitis discharge with [facility] line for IV antibiotics Levaquin and Earavacycline for 4 week. She was refer to ED from her Infectious diseases office for evaluation of swelling of left arm. She was found to have DVT age indeterminate of internal jugular, subclavian, axillary and brachial vein on the left.  She report swelling left arm over past several days. She also report fevers, congestion, cough last week which has resolved. She denies abdominal pain. She was told that her wound has been healing.   ED Course: Dr Laverta Baltimore discussed case with Dr Eden Lathe with vascular who recommend start anticoagulation, Xarelto and remove PICC line. Also Dr long discussed with Dr Tommy Medal , plan is to admit patient, check ESR and CRP, continue with IV antibiotics and ID will evaluate patient in am with further recommendation. WBC 8.6, Hb 10, Platelet pending. INR 1.1. Chest x ray; LEFT PICC distal tip projects in proximal superior vena cava. Retrocardiac consolidation concerning for pneumonia. LEFT mid and lower lung zone atelectasis/scarring with small LEFT pleural effusion versus pleural thickening.  Review of Systems: As per HPI otherwise 10 point review of systems negative.    Past Medical History:  Diagnosis Date  . Bladder stones    Hx of  . Depression   . GERD (gastroesophageal reflux disease)   . Intertrigo 04/24/2017  . Left upper extremity swelling 04/24/2017  .  Multiple sclerosis ([place])   . Paraplegia ([place])   . PTSD (post-traumatic stress disorder)   . Transverse myelitis [facility])     Past Surgical History:  Procedure Laterality Date  . CHOLECYSTECTOMY    . CYSTECTOMY W/ URETEROILEAL CONDUIT    . IR FLUORO GUIDE CV LINE LEFT  02/25/2017  . IR US GUIDE VASC ACCESS LEFT  02/25/2017  . IRRIGATION AND DEBRIDEMENT ABSCESS N/A 02/18/2017   Procedure: IRRIGATION AND DEBRIDEMENT ABSCESS;  Surgeon: Excell Seltzer, MD;  Location: [facility];  Service: General;  Laterality: N/A;  . TONSILLECTOMY    . WISDOM TOOTH EXTRACTION       reports that  has never smoked. she has never used smokeless tobacco. She reports that she does not drink alcohol or use drugs.  Allergies  Allergen Reactions  . Iodine Anaphylaxis  . Latex Anaphylaxis  . Polymyxin B Hives    Pt had hives in right arm with infusion of polymyxin on November 26th, 2018  . Shellfish-Derived Products Anaphylaxis    Reaction unknown  . Vancomycin Anaphylaxis  . Aspirin Other (See Comments)    Reaction unknown  . Strawberry Extract Hives and Rash    Family History  Problem Relation Age of Onset  . Diabetes type II Father   . CAD Father   . Dementia Father   . Stroke Father   . Seizures Father   . Diabetes type II Mother   . Hypertension Mother   . Breast cancer Other   . Diabetes type II Other      Prior to Admission medications   Medication Sig Start Date End Date  Taking? Authorizing Provider  acetaminophen (TYLENOL) 325 MG tablet Take 650 mg by mouth every 6 (six) hours as needed for fever.    [provider]  baclofen (LIORESAL) 20 MG tablet Take 20 mg by mouth 3 (three) times daily.    [provider]  bisacodyl (DULCOLAX) 10 MG suppository Place 1 suppository (10 mg total) daily rectally. Patient taking differently: Place 10 mg rectally daily as needed for mild constipation.  02/15/17   Florencia Reasons, MD  diazepam (VALIUM) 5 MG tablet Take 1-2 tablets (5-10 mg  total) by mouth 3 (three) times daily. Take 1 tablet in AM, 1 tablet in PM, and 2 tablets at bedtime 04/03/17   Bonnielee Haff, MD  diphenhydrAMINE (BENADRYL) 25 mg capsule Take 1 capsule (25 mg total) by mouth every 8 (eight) hours as needed for itching. Patient taking differently: Take 50 mg by mouth every 12 (twelve) hours.  03/12/17   Rosita Fire, MD  ferrous sulfate 325 (65 FE) MG tablet Take 1 tablet (325 mg total) by mouth daily with breakfast. 04/04/17   Bonnielee Haff, MD  fluticasone [facility]) 50 MCG/ACT nasal spray Place 2 sprays into the nose daily as needed for allergies.     [provider]  gabapentin (NEURONTIN) 400 MG capsule Take 2 capsules (800 mg total) by mouth 3 (three) times daily. 08/13/16   Pieter Partridge, DO  ibuprofen (ADVIL,MOTRIN) 200 MG tablet Take 200-400 mg by mouth every 6 (six) hours as needed for headache or moderate pain.     [provider]  lamoTRIgine (LAMICTAL) 25 MG tablet Take 2 tablets (50 mg total) by mouth daily. 04/03/17   Bonnielee Haff, MD  levofloxacin (LEVAQUIN) 750 MG tablet Take 1 tablet (750 mg total) by mouth daily for 28 days. 04/03/17 05/01/17  Bonnielee Haff, MD  loperamide (IMODIUM) 2 MG capsule Take 2 capsules (4 mg total) by mouth 3 (three) times daily as needed for diarrhea or loose stools. 04/03/17   Bonnielee Haff, MD  loratadine (CLARITIN) 5 MG/5ML syrup Take 5 mg daily as needed by mouth for allergies.     [provider]  nystatin cream (MYCOSTATIN) Apply 1 application topically 3 (three) times daily. 04/24/17   Truman Hayward, MD  omeprazole (PRILOSEC) 40 MG capsule Take 40 mg by mouth daily.    [provider]  ondansetron (ZOFRAN-ODT) 4 MG disintegrating tablet Take 1 tablet (4 mg total) by mouth every 8 (eight) hours as needed for nausea or vomiting. Patient taking differently: Take 4 mg by mouth every 12 (twelve) hours.  03/12/17   Rosita Fire, MD  sertraline (ZOLOFT) 100 MG  tablet Take 1 tablet (100 mg total) by mouth 2 (two) times daily. 04/03/17   Bonnielee Haff, MD  sodium chloride 0.9 % SOLN 150 mL with eravacycline 50 MG SOLR 68 mg Inject 68 mg into the vein every 12 (twelve) hours for 28 days. End date: Apr 29, 2017 04/03/17 05/01/17  Bonnielee Haff, MD    Physical Exam: Vitals:   04/24/17 1630 04/24/17 1700 04/24/17 1800 04/24/17 1830  BP: 101/61 95/61 (!) 95/58 97/67  Pulse: 97 96 (!) 101 95  Temp:      SpO2: 99% 100% 99% 99%  Weight:      Height:        Constitutional: NAD, calm, comfortable Vitals:   04/24/17 1630 04/24/17 1700 04/24/17 1800 04/24/17 1830  BP: 101/61 95/61 (!) 95/58 97/67  Pulse: 97 96 (!) 101 95  Temp:      SpO2: 99% 100% 99% 99%  Weight:      Height:       Eyes: PERRL, lids and conjunctivae normal ENMT: Mucous membranes are moist. Posterior pharynx clear of any exudate or lesions.Normal dentition.  Neck: normal, supple, no masses, no thyromegaly Respiratory: clear to auscultation bilaterally, no wheezing, no crackles. Normal respiratory effort. No accessory muscle use.  Cardiovascular: Regular rate and rhythm, no murmurs / rubs / gallops. No extremity edema. 2+ pedal pulses. No carotid bruits.  Abdomen: no tenderness, no masses palpated. No hepatosplenomegaly. Bowel sounds positive. Urostomy with bag.  Musculoskeletal: no clubbing / cyanosis. Left arm with swelling  Skin: sacral decubitus open wound, pink color, no drainage.  Neurologic: CN 2-12 grossly intact. Paraplegia.  Psychiatric: Normal judgment and insight. Alert and oriented x 3. Normal mood.    Labs on Admission: I have personally reviewed following labs and imaging studies  CBC: Recent Labs  Lab 04/24/17 1702  WBC 8.6  NEUTROABS 6.2  HGB 10.7*  HCT 35.9*  MCV 82.0  PLT PENDING   Basic Metabolic Panel: No results for input(s): NA, K, CL, CO2, GLUCOSE, BUN, CREATININE, CALCIUM, MG, PHOS in the last 168 hours. GFR: CrCl cannot be calculated  (Patient's most recent lab result is older than the maximum 21 days allowed.). Liver Function Tests: No results for input(s): AST, ALT, ALKPHOS, BILITOT, PROT, ALBUMIN in the last 168 hours. No results for input(s): LIPASE, AMYLASE in the last 168 hours. No results for input(s): AMMONIA in the last 168 hours. Coagulation Profile: Recent Labs  Lab 04/24/17 1739  INR 1.13   Cardiac Enzymes: No results for input(s): CKTOTAL, CKMB, CKMBINDEX, TROPONINI in the last 168 hours. BNP (last 3 results) No results for input(s): PROBNP in the last 8760 hours. HbA1C: No results for input(s): HGBA1C in the last 72 hours. CBG: No results for input(s): GLUCAP in the last 168 hours. Lipid Profile: No results for input(s): CHOL, HDL, LDLCALC, TRIG, CHOLHDL, LDLDIRECT in the last 72 hours. Thyroid Function Tests: No results for input(s): TSH, T4TOTAL, FREET4, T3FREE, THYROIDAB in the last 72 hours. Anemia Panel: No results for input(s): VITAMINB12, FOLATE, FERRITIN, TIBC, IRON, RETICCTPCT in the last 72 hours. Urine analysis:    Component Value Date/Time   COLORURINE YELLOW 03/26/2017 0223   APPEARANCEUR CLOUDY (A) 03/26/2017 0223   LABSPEC 1.014 03/26/2017 0223   PHURINE 6.0 03/26/2017 0223   GLUCOSEU NEGATIVE 03/26/2017 0223   HGBUR SMALL (A) 03/26/2017 0223   BILIRUBINUR NEGATIVE 03/26/2017 0223   KETONESUR NEGATIVE 03/26/2017 0223   PROTEINUR 30 (A) 03/26/2017 0223   UROBILINOGEN 2.0 (H) 10/29/2014 1627   NITRITE NEGATIVE 03/26/2017 0223   LEUKOCYTESUR TRACE (A) 03/26/2017 0223    Radiological Exams on Admission: Dg Chest 2 View  Result Date: 04/24/2017 CLINICAL DATA:  LEFT arm swelling.  Assess PICC line placement. EXAM: CHEST  2 VIEW COMPARISON:  Chest radiograph March 26, 2017 FINDINGS: Patient rotated to the LEFT. Cardiomediastinal silhouette is normal. LEFT mid and lower lung zone bandlike densities. Blunting of the LEFT costophrenic angle. Retrocardiac consolidation silhouetting  the diaphragm with air bronchograms. No pneumothorax. LEFT PICC distal tip projects in proximal superior vena cava, potentially slightly retracted from prior examination. Punctate calcifications LEFT supraclavicular fossa. Osseous structures are non suspicious. IMPRESSION: LEFT PICC distal tip projects in proximal superior vena cava. Retrocardiac consolidation concerning for pneumonia. LEFT mid and lower lung zone atelectasis/scarring with small LEFT pleural effusion versus pleural thickening. Electronically Signed  By: Elon Alas M.D.   On: 04/24/2017 16:55    EKG: none available.   Assessment/Plan Active Problems:   Paraplegia (HCC)   Multiple sclerosis (HCC)   History of transverse myelitis   Osteomyelitis, pelvis (HCC)   Decubitus ulcer of left ischial area, stage III (HCC)   MDR Acinetobacter baumannii infection   DVT (deep venous thrombosis) (HCC)  1-DVT; internal jugular, subclavian, axillary brachial vein, age indeterminate;  Secondary to [facility] line.  PICC line was removed.  Patient was started on Xarelto.   2-Sacral decubitus osteomyelitis; on IV Eravacycline and oral Levaquin. Follow by ID Dr Lucianne Lei dam. Suppose to be on 4 weeks of IV antibiotics. She has one week left of IV antibiotics.  -ID to see patient in am for further recommendations regarding IV antibiotics length and PICC line placement.  -Plan to check ESR and CRP to monitor infection.  -Wound care consulted.   3-Abnormal chest x ray; ? PNA; patient with no leukocytosis, fever. Will order incentive spirometry. She was sick last week, could have viral illness.  Follow ID recommendations.   4-MS;  continue with support care, gabapentin, Valium, baclofen.    DVT prophylaxis: Xarelto.  Code Status: Full code.  Family Communication:Mother who was at bedside.  Disposition Plan: Back to SNF when stable and depending on recommendation by ID>  Consults called: Case discussed with ID and Vascular by ED physician.    Admission status: Observation, telemetry    Elmarie Shiley MD [facility] Pager [phone]  If 7PM-7AM, please contact night-coverage [email or web address] Password [facility]  04/24/2017, 6:59 PM

## 2017-04-25 ENCOUNTER — Other Ambulatory Visit: Payer: Self-pay

## 2017-04-25 DIAGNOSIS — Z881 Allergy status to other antibiotic agents status: Secondary | ICD-10-CM | POA: Diagnosis not present

## 2017-04-25 DIAGNOSIS — I82890 Acute embolism and thrombosis of other specified veins: Secondary | ICD-10-CM | POA: Diagnosis present

## 2017-04-25 DIAGNOSIS — L304 Erythema intertrigo: Secondary | ICD-10-CM | POA: Diagnosis present

## 2017-04-25 DIAGNOSIS — I82A12 Acute embolism and thrombosis of left axillary vein: Secondary | ICD-10-CM | POA: Diagnosis present

## 2017-04-25 DIAGNOSIS — G8220 Paraplegia, unspecified: Secondary | ICD-10-CM | POA: Diagnosis present

## 2017-04-25 DIAGNOSIS — E876 Hypokalemia: Secondary | ICD-10-CM | POA: Diagnosis present

## 2017-04-25 DIAGNOSIS — Y848 Other medical procedures as the cause of abnormal reaction of the patient, or of later complication, without mention of misadventure at the time of the procedure: Secondary | ICD-10-CM | POA: Diagnosis present

## 2017-04-25 DIAGNOSIS — K219 Gastro-esophageal reflux disease without esophagitis: Secondary | ICD-10-CM | POA: Diagnosis present

## 2017-04-25 DIAGNOSIS — M869 Osteomyelitis, unspecified: Secondary | ICD-10-CM

## 2017-04-25 DIAGNOSIS — G35 Multiple sclerosis: Secondary | ICD-10-CM | POA: Diagnosis present

## 2017-04-25 DIAGNOSIS — R918 Other nonspecific abnormal finding of lung field: Secondary | ICD-10-CM | POA: Diagnosis present

## 2017-04-25 DIAGNOSIS — I82619 Acute embolism and thrombosis of superficial veins of unspecified upper extremity: Secondary | ICD-10-CM | POA: Diagnosis present

## 2017-04-25 DIAGNOSIS — I82C19 Acute embolism and thrombosis of unspecified internal jugular vein: Secondary | ICD-10-CM | POA: Diagnosis present

## 2017-04-25 DIAGNOSIS — G373 Acute transverse myelitis in demyelinating disease of central nervous system: Secondary | ICD-10-CM | POA: Diagnosis not present

## 2017-04-25 DIAGNOSIS — Z91040 Latex allergy status: Secondary | ICD-10-CM | POA: Diagnosis not present

## 2017-04-25 DIAGNOSIS — F329 Major depressive disorder, single episode, unspecified: Secondary | ICD-10-CM | POA: Diagnosis present

## 2017-04-25 DIAGNOSIS — Z9102 Food additives allergy status: Secondary | ICD-10-CM | POA: Diagnosis not present

## 2017-04-25 DIAGNOSIS — A499 Bacterial infection, unspecified: Secondary | ICD-10-CM | POA: Diagnosis not present

## 2017-04-25 DIAGNOSIS — L89159 Pressure ulcer of sacral region, unspecified stage: Secondary | ICD-10-CM | POA: Diagnosis present

## 2017-04-25 DIAGNOSIS — Z91041 Radiographic dye allergy status: Secondary | ICD-10-CM | POA: Diagnosis not present

## 2017-04-25 DIAGNOSIS — L89323 Pressure ulcer of left buttock, stage 3: Secondary | ICD-10-CM | POA: Diagnosis present

## 2017-04-25 DIAGNOSIS — Z1624 Resistance to multiple antibiotics: Secondary | ICD-10-CM

## 2017-04-25 DIAGNOSIS — Z91013 Allergy to seafood: Secondary | ICD-10-CM | POA: Diagnosis not present

## 2017-04-25 DIAGNOSIS — D649 Anemia, unspecified: Secondary | ICD-10-CM | POA: Diagnosis present

## 2017-04-25 DIAGNOSIS — I82622 Acute embolism and thrombosis of deep veins of left upper extremity: Secondary | ICD-10-CM | POA: Diagnosis present

## 2017-04-25 DIAGNOSIS — Z886 Allergy status to analgesic agent status: Secondary | ICD-10-CM | POA: Diagnosis not present

## 2017-04-25 DIAGNOSIS — T82868A Thrombosis of vascular prosthetic devices, implants and grafts, initial encounter: Secondary | ICD-10-CM | POA: Diagnosis not present

## 2017-04-25 DIAGNOSIS — Z8614 Personal history of Methicillin resistant Staphylococcus aureus infection: Secondary | ICD-10-CM | POA: Diagnosis not present

## 2017-04-25 LAB — BASIC METABOLIC PANEL
Anion gap: 8 (ref 5–15)
BUN: 23 mg/dL — AB (ref 6–20)
CO2: 23 mmol/L (ref 22–32)
CREATININE: 0.33 mg/dL — AB (ref 0.44–1.00)
Calcium: 6.9 mg/dL — ABNORMAL LOW (ref 8.9–10.3)
Chloride: 110 mmol/L (ref 101–111)
GFR calc Af Amer: >60 mL/min (ref >60)
GLUCOSE: 90 mg/dL (ref 65–99)
POTASSIUM: 3.4 mmol/L — AB (ref 3.5–5.1)
SODIUM: 141 mmol/L (ref 135–145)

## 2017-04-25 LAB — MRSA PCR SCREENING: MRSA by PCR: NEGATIVE

## 2017-04-25 LAB — CBC
HEMATOCRIT: 31.5 % — AB (ref 36.0–46.0)
Hemoglobin: 9.7 g/dL — ABNORMAL LOW (ref 12.0–15.0)
MCH: 25.1 pg — ABNORMAL LOW (ref 26.0–34.0)
MCHC: 30.8 g/dL (ref 30.0–36.0)
MCV: 81.6 fL (ref 78.0–100.0)
PLATELETS: 153 10*3/uL (ref 150–400)
RBC: 3.86 MIL/uL — ABNORMAL LOW (ref 3.87–5.11)
RDW: 26.4 % — AB (ref 11.5–15.5)
WBC: 6.3 10*3/uL (ref 4.0–10.5)

## 2017-04-25 MED ORDER — POTASSIUM CHLORIDE CRYS ER 20 MEQ PO TBCR
40.0000 meq | EXTENDED_RELEASE_TABLET | Freq: Once | ORAL | Status: AC
  Administered 2017-04-25: 40 meq via ORAL
  Filled 2017-04-25: qty 2

## 2017-04-25 NOTE — Progress Notes (Signed)
PROGRESS NOTE    [name]  MRN:[id number] DOB: 04/03/1980 DOA: 04/24/2017 PCP: Redmon, Noelle, PA-C   Chief Complaint  Patient presents with  . Vascular Access Problem    Brief Narrative:  HPI on 04/24/2017 by Dr. Belkys Regalado [name] is a 36 y.o. female with medical history significant of Paraplegia, MS, transverse myelitis, decubitus ulcer left ischial area IV, Osteomyelitis, MDR Acinetobacter, recently discharge from hospital 04-03-2017 treated for sepsis secondary to sacral decubitus osteomyelitis discharge with PIIC line for IV antibiotics Levaquin and Earavacycline for 4 week. She was refer to ED from her Infectious diseases office for evaluation of swelling of left arm. She was found to have DVT age indeterminate of internal jugular, subclavian, axillary and brachial vein on the left.  She report swelling left arm over past several days. She also report fevers, congestion, cough last week which has resolved. She denies abdominal pain. She was told that her wound has been healing.   Assessment & Plan   DVT -Left upper extremity ultrasound showed age-indeterminate DVT involving internal jugular, subclavian, axillary, brachial veins. Age-indeterminate superficial vein thrombosis involving basilic vein -Continue Xarelto  Sacral decubitus osteomyelitis -Followed by infectious disease -history of MDR Acinetobacter, MRSA -Continue IV Eravacycline and oral levaquin through 04/29/17 -Will not reinsert PICC line, will continue these antibiotics during hospitalization -Wound care consulted not appreciated -ESR 0, CRP 1.1  Abnormal chest x-ray -Currently febrile with a leukocytosis -Will continue incentive spirometer -Patient states she was sick last week, could be thyroid illness  Multiple sclerosis -Continue supportive care, gabapentin, Valium, baclofen  Depression -Continue Zoloft  Hypokalemia -Will replace and continue to monitor BMP  DVT Prophylaxis   Xarelto   Code Status: Full  Family Communication: Mother at bedside  Disposition Plan: Observation, pending completion of antibiotics  Consultants Infectious disease  Procedures  LUE doppler  Antibiotics   Anti-infectives (From admission, onward)   Start     Dose/Rate Route Frequency Ordered Stop   04/25/17 1000  levofloxacin (LEVAQUIN) tablet 750 mg     750 mg Oral Daily 04/24/17 2310 05/02/17 0959   04/24/17 2359  eravacycline (XERAVA) 68 mg in sodium chloride 0.9 % 150 mL IVPB    Comments:  End date: Apr 29, 2017     68 mg 150 mL/hr over 60 Minutes Intravenous 2 times daily 04/24/17 2310 04/30/17 0959      Subjective:   [name] seen and examined today.  Patient has no complaints today. Denies dizziness, chest pain, shortness of breath, abdominal pain, N/V/D/C.  Objective:   Vitals:   04/24/17 2100 04/24/17 2237 04/25/17 0519 04/25/17 1000  BP: (!) 94/57 103/65 106/70 (!) 100/57  Pulse: 96 97 86 89  Resp:  17 16 14  Temp:  98 F (36.7 C) 97.7 F (36.5 C) 98.3 F (36.8 C)  TempSrc:  Oral Oral Oral  SpO2: 98% 100% 97% 100%  Weight:      Height:        Intake/Output Summary (Last 24 hours) at 04/25/2017 1412 Last data filed at 04/25/2017 0700 Gross per 24 hour  Intake 750 ml  Output -  Net 750 ml   Filed Weights   04/24/17 1546  Weight: 68 kg (150 lb)    Exam  General: Well developed, well nourished, NAD, appears stated age  HEENT: NCAT, mucous membranes moist.   Cardiovascular: S1 S2 auscultated, no rubs, murmurs or gallops. Regular rate and rhythm.  Respiratory: Clear to auscultation bilaterally with equal chest   rise  Abdomen: Soft, nontender, nondistended, + bowel sounds. Urostomy   Extremities: warm dry without cyanosis clubbing or edema  Neuro: AAOx3, paraplegia  Psych: Normal affect and demeanor   Data Reviewed: I have personally reviewed following labs and imaging studies  CBC: Recent Labs  Lab 04/24/17 1702 04/25/17 0220   WBC 8.6 6.3  NEUTROABS 6.2  --   HGB 10.7* 9.7*  HCT 35.9* 31.5*  MCV 82.0 81.6  PLT 165 153   Basic Metabolic Panel: Recent Labs  Lab 04/24/17 1739 04/25/17 0220  NA 140 141  K 4.3 3.4*  CL 106 110  CO2 25 23  GLUCOSE 98 90  BUN 24* 23*  CREATININE 0.38* 0.33*  CALCIUM 7.9* 6.9*   GFR: Estimated Creatinine Clearance: 89.9 mL/min (A) (by C-G formula based on SCr of 0.33 mg/dL (L)). Liver Function Tests: Recent Labs  Lab 04/24/17 1739  AST 35  ALT 25  ALKPHOS 304*  BILITOT 0.6  PROT 3.8*  ALBUMIN 1.7*   No results for input(s): LIPASE, AMYLASE in the last 168 hours. No results for input(s): AMMONIA in the last 168 hours. Coagulation Profile: Recent Labs  Lab 04/24/17 1739  INR 1.13   Cardiac Enzymes: No results for input(s): CKTOTAL, CKMB, CKMBINDEX, TROPONINI in the last 168 hours. BNP (last 3 results) No results for input(s): PROBNP in the last 8760 hours. HbA1C: No results for input(s): HGBA1C in the last 72 hours. CBG: No results for input(s): GLUCAP in the last 168 hours. Lipid Profile: No results for input(s): CHOL, HDL, LDLCALC, TRIG, CHOLHDL, LDLDIRECT in the last 72 hours. Thyroid Function Tests: No results for input(s): TSH, T4TOTAL, FREET4, T3FREE, THYROIDAB in the last 72 hours. Anemia Panel: No results for input(s): VITAMINB12, FOLATE, FERRITIN, TIBC, IRON, RETICCTPCT in the last 72 hours. Urine analysis:    Component Value Date/Time   COLORURINE YELLOW 03/26/2017 0223   APPEARANCEUR CLOUDY (A) 03/26/2017 0223   LABSPEC 1.014 03/26/2017 0223   PHURINE 6.0 03/26/2017 0223   GLUCOSEU NEGATIVE 03/26/2017 0223   HGBUR SMALL (A) 03/26/2017 0223   BILIRUBINUR NEGATIVE 03/26/2017 0223   KETONESUR NEGATIVE 03/26/2017 0223   PROTEINUR 30 (A) 03/26/2017 0223   UROBILINOGEN 2.0 (H) 10/29/2014 1627   NITRITE NEGATIVE 03/26/2017 0223   LEUKOCYTESUR TRACE (A) 03/26/2017 0223   Sepsis Labs: @LABRCNTIP(procalcitonin:4,lacticidven:4)  ) Recent  Results (from the past 240 hour(s))  MRSA PCR Screening     Status: None   Collection Time: 04/24/17 11:11 PM  Result Value Ref Range Status   MRSA by PCR NEGATIVE NEGATIVE Final    Comment:        The GeneXpert MRSA Assay (FDA approved for NASAL specimens only), is one component of a comprehensive MRSA colonization surveillance program. It is not intended to diagnose MRSA infection nor to guide or monitor treatment for MRSA infections.       Radiology Studies: Dg Chest 2 View  Result Date: 04/24/2017 CLINICAL DATA:  LEFT arm swelling.  Assess PICC line placement. EXAM: CHEST  2 VIEW COMPARISON:  Chest radiograph March 26, 2017 FINDINGS: Patient rotated to the LEFT. Cardiomediastinal silhouette is normal. LEFT mid and lower lung zone bandlike densities. Blunting of the LEFT costophrenic angle. Retrocardiac consolidation silhouetting the diaphragm with air bronchograms. No pneumothorax. LEFT PICC distal tip projects in proximal superior vena cava, potentially slightly retracted from prior examination. Punctate calcifications LEFT supraclavicular fossa. Osseous structures are non suspicious. IMPRESSION: LEFT PICC distal tip projects in proximal superior vena cava. Retrocardiac consolidation   concerning for pneumonia. LEFT mid and lower lung zone atelectasis/scarring with small LEFT pleural effusion versus pleural thickening. Electronically Signed   By: Courtnay  Bloomer M.D.   On: 04/24/2017 16:55     Scheduled Meds: . baclofen  20 mg Oral TID  . diazepam  5 mg Oral BID   And  . diazepam  10 mg Oral QHS  . ferrous sulfate  325 mg Oral Q breakfast  . gabapentin  800 mg Oral TID  . lamoTRIgine  50 mg Oral Daily  . levofloxacin  750 mg Oral Daily  . nystatin cream  1 application Topical TID  . pantoprazole  40 mg Oral Daily  . potassium chloride  40 mEq Oral Once  . Rivaroxaban  15 mg Oral BID WC  . [START ON 05/16/2017] rivaroxaban  20 mg Oral Q supper  . sertraline  100 mg Oral  BID   Continuous Infusions: . sodium chloride 100 mL/hr at 04/25/17 0133  . eravacycline (XERAVA) IVPB in 150 mL NS Stopped (04/25/17 1102)     LOS: 0 days   Time Spent in minutes   30 minutes    D.O. on 04/25/2017 at 2:12 PM  Between 7am to 7pm - Pager - [phone]  After 7pm go to [email or web address] - password [email or web address]  And look for the night coverage person covering for me after hours  [facility] Office  [phone]  

## 2017-04-25 NOTE — Consult Note (Addendum)
[place] Nurse wound consult note Reason for Consult: pressure injury Patient known to the [facility] nurse team for chronic Stage 4 pressure injury. Last seen by our team 12.26.18 Wound type: Stage 3 Pressure Injury left ischium   She is noted to have fungal overgrowth, bilateral groin area (candida)  Pressure Injury POA: Yes Measurement:6.5cm x 3.5cm x 0.3cm  Wound PJK:DTOI, non granular, chronic in appearance, palpable bone  Drainage (amount, consistency, odor) unable to assess, cover with stool  Periwound: intact, severe epibole of the wound edges, non healing Dressing procedure/placement/frequency: Saline moist gauze, would not suggest more advanced dressings at this time due to the proximity of the wound to the anus and the content contamination of the wound.  Would need diverting ostomy for more advanced therapies as treatment.  Low air loss mattress in place for moisture management and pressure redistribution.  Limit time up on chair to less than 2 hours at a time.  Maximize nutrition for wound healing, suggest MVI, Vit C, and zinc Antifungal cream ordered for ITD. Discussed heel pressure injury prevention with CG, however it is reported she has two sets of Prevalon boots at home, will not order more at this time due to expense.  Float vunerable heels at all times.  Follow up in wound care center of patient's choice at DC.   Discussed POC with patient and bedside nurse.  Re consult if needed, will not follow at this time. Thanks  Evany Schecter [employer], RN,CWOCN, CNS, [place] [phone])

## 2017-04-26 DIAGNOSIS — G373 Acute transverse myelitis in demyelinating disease of central nervous system: Secondary | ICD-10-CM

## 2017-04-26 DIAGNOSIS — L89323 Pressure ulcer of left buttock, stage 3: Secondary | ICD-10-CM

## 2017-04-26 LAB — BASIC METABOLIC PANEL
Anion gap: 9 (ref 5–15)
BUN: 15 mg/dL (ref 6–20)
CHLORIDE: 110 mmol/L (ref 101–111)
CO2: 21 mmol/L — AB (ref 22–32)
CREATININE: 0.35 mg/dL — AB (ref 0.44–1.00)
Calcium: 7.4 mg/dL — ABNORMAL LOW (ref 8.9–10.3)
GFR calc Af Amer: >60 mL/min (ref >60)
GFR calc non Af Amer: >60 mL/min (ref >60)
Glucose, Bld: 65 mg/dL (ref 65–99)
POTASSIUM: 4.7 mmol/L (ref 3.5–5.1)
Sodium: 140 mmol/L (ref 135–145)

## 2017-04-26 LAB — CBC
HEMATOCRIT: 33.7 % — AB (ref 36.0–46.0)
Hemoglobin: 10.4 g/dL — ABNORMAL LOW (ref 12.0–15.0)
MCH: 25.5 pg — ABNORMAL LOW (ref 26.0–34.0)
MCHC: 30.9 g/dL (ref 30.0–36.0)
MCV: 82.6 fL (ref 78.0–100.0)
PLATELETS: 201 10*3/uL (ref 150–400)
RBC: 4.08 MIL/uL (ref 3.87–5.11)
RDW: 27.3 % — AB (ref 11.5–15.5)
WBC: 5.2 10*3/uL (ref 4.0–10.5)

## 2017-04-26 NOTE — Clinical Social Work Note (Signed)
Clinical Social Work Assessment  Patient Details  Name: [name] MRN: [id number] Date of Birth: 1980-08-19  Date of referral:  04/26/17               Reason for consult:  Facility Placement                Permission sought to share information with:  Family Supports Permission granted to share information::  Yes, Release of Information Signed, Yes, Verbal Permission Granted  Name::     Nature conservation officer::     Relationship::  Mother  Contact Information:     Housing/Transportation Living arrangements for the past 2 months:  [place] of Information:  Patient Patient Interpreter Needed:  None Criminal Activity/Legal Involvement Pertinent to Current Situation/Hospitalization:  No - Comment as needed Significant Relationships:  Parents Lives with:  Self Do you feel safe going back to the place where you live?  Yes Need for family participation in patient care:     Care giving concerns:  Pt lives with mother.   Social Worker assessment / plan:  CSW spoke with pt at bedside. Pt confirms she is from [facility]. [name] reports she will move back in with her when her mother's arm heals; "in the next few months". Facility has accepted pt and CSW will set up transport once pt is medically stable for d/c.   Employment status:  Unemployed Forensic scientist:  Medicaid In [place] PT Recommendations:  Not assessed at this time Information / Referral to community resources:  [place]  Patient/Family's Response to care:  Pt verbalized understanding of CSW role and expressed appreciation for support. Pt denies any concern regarding pt care at this time.   Patient/Family's Understanding of and Emotional Response to Diagnosis, Current Treatment, and Prognosis:  Pt understanding and realistic regarding physical limitations. Pt understands the need for SNF placement at d/c. Pt agreeable to SNF placement at d/c, at this time. Pt's responses  emotionally appropriate during conversation with CSW. Pt denies any concern regarding treatment plan at this time. CSW will continue to provide support and facilitate d/c needs.   Emotional Assessment Appearance:  Appears stated age Attitude/Demeanor/Rapport:  (Patient was appropriate.) Affect (typically observed):  Accepting, Appropriate, Calm Orientation:  Oriented to Self, Oriented to Place, Oriented to  Time, Oriented to Situation Alcohol / Substance use:  Not Applicable Psych involvement (Current and /or in the community):  No (Comment)  Discharge Needs  Concerns to be addressed:  No discharge needs identified Readmission within the last 30 days:  No Current discharge risk:  Dependent with Mobility Barriers to Discharge:  Continued Medical Work up   [employer], LCSW 04/26/2017, 2:53 PM

## 2017-04-26 NOTE — Clinical Social Work Placement (Signed)
   CLINICAL SOCIAL WORK PLACEMENT  NOTE  Date:  04/26/2017  Patient Details  Name: [name] MRN: [id number] Date of Birth: 1980/12/25  Clinical Social Work is seeking post-discharge placement for this patient at the [place] level of care (*CSW will initial, date and re-position this form in  chart as items are completed):      Patient/family provided with [place] Work Department's list of facilities offering this level of care within the geographic area requested by the patient (or if unable, by the patient's family).  Yes   Patient/family informed of their freedom to choose among providers that offer the needed level of care, that participate in Medicare, Medicaid or managed care program needed by the patient, have an available bed and are willing to accept the patient.      Patient/family informed of [facility]'s ownership interest in [facility] and [facility], as well as of the fact that they are under no obligation to receive care at these facilities.  PASRR submitted to EDS on       PASRR number received on       Existing PASRR number confirmed on 04/26/17     FL2 transmitted to all facilities in geographic area requested by pt/family on 04/26/17     FL2 transmitted to all facilities within larger geographic area on       Patient informed that his/her managed care company has contracts with or will negotiate with certain facilities, including the following:        Yes   Patient/family informed of bed offers received.  Patient chooses bed at [facility]     Physician recommends and patient chooses bed at      Patient to be transferred to [facility] on  .  Patient to be transferred to facility by [facility]     Patient family notified on   of transfer.  Name of family member notified:        PHYSICIAN       Additional Comment:    _______________________________________________ Eileen Stanford, LCSW 04/26/2017,  2:55 PM

## 2017-04-26 NOTE — Discharge Instructions (Signed)
Information on my medicine - XARELTO (rivaroxaban)  This medication education was reviewed with me or my healthcare representative as part of my discharge preparation.  The pharmacist that spoke with me during my hospital stay was:  Dareen Piano, [place]? Xarelto was prescribed to treat blood clots that may have been found in the veins of your legs (deep vein thrombosis) or in your lungs (pulmonary embolism) and to reduce the risk of them occurring again.  What do you need to know about Xarelto? The starting dose is one 15 mg tablet taken TWICE daily with food for the FIRST 21 DAYS then on 05/16/17 the dose is changed to one 20 mg tablet taken ONCE A DAY with your evening meal.  DO NOT stop taking Xarelto without talking to the health care provider who prescribed the medication.  Refill your prescription for 20 mg tablets before you run out.  After discharge, you should have regular check-up appointments with your healthcare provider that is prescribing your Xarelto.  In the future your dose may need to be changed if your kidney function changes by a significant amount.  What do you do if you miss a dose? If you are taking Xarelto TWICE DAILY and you miss a dose, take it as soon as you remember. You may take two 15 mg tablets (total 30 mg) at the same time then resume your regularly scheduled 15 mg twice daily the next day.  If you are taking Xarelto ONCE DAILY and you miss a dose, take it as soon as you remember on the same day then continue your regularly scheduled once daily regimen the next day. Do not take two doses of Xarelto at the same time.   Important Safety Information Xarelto is a blood thinner medicine that can cause bleeding. You should call your healthcare provider right away if you experience any of the following: ? Bleeding from an injury or your nose that does not stop. ? Unusual colored urine (red or dark brown) or unusual  colored stools (red or black). ? Unusual bruising for unknown reasons. ? A serious fall or if you hit your head (even if there is no bleeding).  Some medicines may interact with Xarelto and might increase your risk of bleeding while on Xarelto. To help avoid this, consult your healthcare provider or pharmacist prior to using any new prescription or non-prescription medications, including herbals, vitamins, non-steroidal anti-inflammatory drugs (NSAIDs) and supplements.  This website has more information on Xarelto: [email or web address]

## 2017-04-26 NOTE — NC FL2 (Signed)
[place] LEVEL OF CARE SCREENING TOOL     IDENTIFICATION  Patient Name: [name] Birthdate: [birth year] Sex: female Admission Date (Current Location): 04/24/2017  [facility] and [place] Number:  Herbalist and Address:  [facility] [place], [place] [address], [place], [place] [address]      Provider Number: [id number]  Attending Physician Name and Address:  Cristal Ford, DO  Relative Name and Phone Number:       Current Level of Care: Hospital Recommended Level of Care: [place] Prior Approval Number:    Date Approved/Denied:   PASRR Number:  [id number]  expires 05/03/17  Discharge Plan: SNF    Current Diagnoses: Patient Active Problem List   Diagnosis Date Noted  . Intertrigo 04/24/2017  . Left upper extremity swelling 04/24/2017  . DVT (deep venous thrombosis) ([place]) 04/24/2017  . Hypotension due to hypovolemia   . Thrombocytopenia ([place])   . Acute kidney injury (nontraumatic) ([place])   . Sepsis associated hypotension ([place]) 03/26/2017  . AKI (acute kidney injury) ([place]) 03/26/2017  . Somnolence   . Facial droop   . MDR Acinetobacter baumannii infection   . Acinetobacter lwoffi infection   . Proteus infection   . MRSA infection   . Decubitus ulcer of left ischial area, stage III ([place]) 11/15/2016  . Diarrhea 07/17/2016  . Osteomyelitis, pelvis ([place]) 02/16/2016  . Constipation 10/29/2014  . Multiple sclerosis ([place]) 10/16/2011  . Paraplegia ([place])   . History of transverse myelitis     Orientation RESPIRATION BLADDER Height & Weight     Self, Time, Situation, Place  Normal Incontinent Weight: 150 lb (68 kg) Height:  5\' 3"  (160 cm)  BEHAVIORAL SYMPTOMS/MOOD NEUROLOGICAL BOWEL NUTRITION STATUS      Continent (Please see d/c summary)  AMBULATORY STATUS COMMUNICATION OF NEEDS Skin   Extensive Assist Verbally PU Stage and Appropriate Care PU Stage 1 Dressing: BID(Left heel)     PU Stage 4 Dressing:  (Left buttocks, Guaze dressing, PRN)               Personal Care Assistance Level of Assistance  Bathing, Feeding, Dressing Bathing Assistance: Maximum assistance Feeding assistance: Limited assistance Dressing Assistance: Maximum assistance     Functional Limitations Info  Sight, Hearing, Speech Sight Info: Adequate Hearing Info: Adequate Speech Info: Adequate    SPECIAL CARE FACTORS FREQUENCY  PT (By licensed PT), OT (By licensed OT)     PT Frequency: 5x OT Frequency: 5x            Contractures Contractures Info: Not present    Additional Factors Info  Code Status, Allergies Code Status Info: Full Code Allergies Info: Iodine, Latex, Polymyxin B, Shellfish-derived Products, Vancomycin, Aspirin, Strawberry Extract     Isolation Precautions Info: Contact precautions- OMDRO/MRSA     Current Medications (04/26/2017):  This is the current hospital active medication list Current Facility-Administered Medications  Medication Dose Route Frequency Provider Last Rate Last Dose  . 0.9 %  sodium chloride infusion   Intravenous Continuous Regalado, Belkys A, MD 100 mL/hr at 04/25/17 2221    . acetaminophen (TYLENOL) tablet 650 mg  650 mg Oral Q6H PRN Regalado, Belkys A, MD      . baclofen (LIORESAL) tablet 20 mg  20 mg Oral TID Regalado, Belkys A, MD   20 mg at 04/26/17 1146  . bisacodyl (DULCOLAX) suppository 10 mg  10 mg Rectal Daily PRN Regalado, Belkys A, MD      .  diazepam (VALIUM) tablet 5 mg  5 mg Oral BID Regalado, Belkys A, MD   5 mg at 04/26/17 0808   And  . diazepam (VALIUM) tablet 10 mg  10 mg Oral QHS Regalado, Belkys A, MD   10 mg at 04/25/17 2222  . diphenhydrAMINE (BENADRYL) capsule 25 mg  25 mg Oral Q8H PRN Regalado, Belkys A, MD      . eravacycline (XERAVA) 68 mg in sodium chloride 0.9 % 150 mL IVPB  68 mg Intravenous BID Regalado, Belkys A, MD   Stopped at 04/26/17 1245  . ferrous sulfate tablet 325 mg  325 mg Oral Q breakfast Regalado, Belkys A, MD   325 mg  at 04/26/17 0807  . fluticasone (FLONASE) 50 MCG/ACT nasal spray 2 spray  2 spray Each Nare Daily PRN Regalado, Belkys A, MD      . gabapentin (NEURONTIN) capsule 800 mg  800 mg Oral TID Regalado, Belkys A, MD   800 mg at 04/26/17 1146  . lamoTRIgine (LAMICTAL) tablet 50 mg  50 mg Oral Daily Regalado, Belkys A, MD   50 mg at 04/26/17 1145  . levofloxacin (LEVAQUIN) tablet 750 mg  750 mg Oral Daily Regalado, Belkys A, MD   750 mg at 04/26/17 1144  . loperamide (IMODIUM) capsule 4 mg  4 mg Oral TID PRN Regalado, Belkys A, MD   4 mg at 04/25/17 0941  . loratadine (CLARITIN) tablet 5 mg  5 mg Oral Daily PRN Regalado, Belkys A, MD      . nystatin cream (MYCOSTATIN) 1 application  1 application Topical TID Regalado, Belkys A, MD   1 application at 23/30/07 1147  . ondansetron (ZOFRAN-ODT) disintegrating tablet 4 mg  4 mg Oral Q8H PRN Regalado, Belkys A, MD      . pantoprazole (PROTONIX) EC tablet 40 mg  40 mg Oral Daily Regalado, Belkys A, MD   40 mg at 04/26/17 1146  . Rivaroxaban (XARELTO) tablet 15 mg  15 mg Oral BID WC Lavenia Atlas, RPH   15 mg at 04/26/17 6226  . [START ON 05/16/2017] rivaroxaban (XARELTO) tablet 20 mg  20 mg Oral Q supper Lavenia Atlas, RPH      . sertraline (ZOLOFT) tablet 100 mg  100 mg Oral BID Regalado, Belkys A, MD   100 mg at 04/26/17 1147  . traMADol (ULTRAM) tablet 50 mg  50 mg Oral Q6H PRN Regalado, Belkys A, MD         Discharge Medications: Please see discharge summary for a list of discharge medications.  Relevant Imaging Results:  Relevant Lab Results:   Additional Information SSN: [id number]  Eileen Stanford, LCSW

## 2017-04-26 NOTE — Progress Notes (Signed)
PROGRESS NOTE    THREASA KINCH  GTX:[id number] DOB: 06/22/1980 DOA: 04/24/2017 PCP: Lennie Odor, PA-C   Chief Complaint  Patient presents with  . Vascular Access Problem    Brief Narrative:  HPI on 04/24/2017 by Dr. Niel Hummer MAANASA ADERHOLD is a 37 y.o. female with medical history significant of Paraplegia, MS, transverse myelitis, decubitus ulcer left ischial area IV, Osteomyelitis, MDR Acinetobacter, recently discharge from hospital 04-03-2017 treated for sepsis secondary to sacral decubitus osteomyelitis discharge with [facility] line for IV antibiotics Levaquin and Earavacycline for 4 week. She was refer to ED from her Infectious diseases office for evaluation of swelling of left arm. She was found to have DVT age indeterminate of internal jugular, subclavian, axillary and brachial vein on the left.  She report swelling left arm over past several days. She also report fevers, congestion, cough last week which has resolved. She denies abdominal pain. She was told that her wound has been healing.   Assessment & Plan   DVT -Left upper extremity ultrasound showed age-indeterminate DVT involving internal jugular, subclavian, axillary, brachial veins. Age-indeterminate superficial vein thrombosis involving basilic vein -Continue Xarelto  Sacral decubitus osteomyelitis -Followed by infectious disease -history of MDR Acinetobacter, MRSA -Continue IV Eravacycline and oral levaquin through 04/29/17 -Will not reinsert PICC line, will continue these antibiotics during hospitalization -Wound care consulted not appreciated -ESR 0, CRP 1.1  Abnormal chest x-ray -Currently febrile with a leukocytosis -Will continue incentive spirometer -Patient states she was sick last week, could be thyroid illness  Multiple sclerosis -Continue supportive care, gabapentin, Valium, baclofen  Depression -Continue Zoloft  Hypokalemia -Resolved, continue monitor BMP  Chronic anemia -Hemoglobin  appears to be at baseline, continue monitor CBC  DVT Prophylaxis  Xarelto   Code Status: Full  Family Communication: Mother at bedside  Disposition Plan: Admitted. Continue antibiotics   Consultants Infectious disease  Procedures  LUE doppler  Antibiotics   Anti-infectives (From admission, onward)   Start     Dose/Rate Route Frequency Ordered Stop   04/25/17 1000  levofloxacin (LEVAQUIN) tablet 750 mg     750 mg Oral Daily 04/24/17 2310 05/02/17 0959   04/24/17 2359  eravacycline (XERAVA) 68 mg in sodium chloride 0.9 % 150 mL IVPB    Comments:  End date: Apr 29, 2017     68 mg 150 mL/hr over 60 Minutes Intravenous 2 times daily 04/24/17 2310 04/30/17 0959      Subjective:   Shalimar Doyle Askew seen and examined today.  Patient has no complaints today. Denies dizziness, chest pain, shortness of breath, abdominal pain, N/V/D/C.  Objective:   Vitals:   04/25/17 2012 04/26/17 0541 04/26/17 0900 04/26/17 1100  BP: (!) _0 117/73  Pulse: 94 95 73 94  Resp: 16 16  (!) 23  Temp: 98.1 F (36.7 C) 98.4 F (36.9 C) 98.3 F (36.8 C) 97.6 F (36.4 C)  TempSrc: Oral Oral Oral Oral  SpO2: 99% 97% 99% 100%  Weight:      Height:        Intake/Output Summary (Last 24 hours) at 04/26/2017 1402 Last data filed at 04/26/2017 1300 Gross per 24 hour  Intake 2320 ml  Output 4600 ml  Net -2280 ml   Filed Weights   04/24/17 1546  Weight: 68 kg (150 lb)    Exam   General: Well developed, well nourished, NAD, appears stated age  HEENT: NCAT, mucous membranes moist.   Cardiovascular: S1 S2 auscultated, RRR, no murmurs  Respiratory:  Clear to auscultation bilaterally with equal chest rise  Abdomen: Soft, nontender, nondistended, + bowel sounds. Urostomy   Extremities: warm dry without cyanosis clubbing or edema  Neuro: AAOx3, paraplegia  Psych: Normal affect and demeanor, pleasant    Data Reviewed: I have personally reviewed following labs and imaging  studies  CBC: Recent Labs  Lab 04/24/17 1702 04/25/17 0220 04/26/17 0335  WBC 8.6 6.3 5.2  NEUTROABS 6.2  --   --   HGB 10.7* 9.7* 10.4*  HCT 35.9* 31.5* 33.7*  MCV 82.0 81.6 82.6  PLT 165 153 536   Basic Metabolic Panel: Recent Labs  Lab 04/24/17 1739 04/25/17 0220 04/26/17 0335  NA 140 141 140  K 4.3 3.4* 4.7  CL 106 110 110  CO2 25 23 21*  GLUCOSE 98 90 65  BUN 24* 23* 15  CREATININE 0.38* 0.33* 0.35*  CALCIUM 7.9* 6.9* 7.4*   GFR: Estimated Creatinine Clearance: 89.9 mL/min (A) (by C-G formula based on SCr of 0.35 mg/dL (L)). Liver Function Tests: Recent Labs  Lab 04/24/17 1739  AST 35  ALT 25  ALKPHOS 304*  BILITOT 0.6  PROT 3.8*  ALBUMIN 1.7*   No results for input(s): LIPASE, AMYLASE in the last 168 hours. No results for input(s): AMMONIA in the last 168 hours. Coagulation Profile: Recent Labs  Lab 04/24/17 1739  INR 1.13   Cardiac Enzymes: No results for input(s): CKTOTAL, CKMB, CKMBINDEX, TROPONINI in the last 168 hours. BNP (last 3 results) No results for input(s): PROBNP in the last 8760 hours. HbA1C: No results for input(s): HGBA1C in the last 72 hours. CBG: No results for input(s): GLUCAP in the last 168 hours. Lipid Profile: No results for input(s): CHOL, HDL, LDLCALC, TRIG, CHOLHDL, LDLDIRECT in the last 72 hours. Thyroid Function Tests: No results for input(s): TSH, T4TOTAL, FREET4, T3FREE, THYROIDAB in the last 72 hours. Anemia Panel: No results for input(s): VITAMINB12, FOLATE, FERRITIN, TIBC, IRON, RETICCTPCT in the last 72 hours. Urine analysis:    Component Value Date/Time   COLORURINE YELLOW 03/26/2017 0223   APPEARANCEUR CLOUDY (A) 03/26/2017 0223   LABSPEC 1.014 03/26/2017 0223   PHURINE 6.0 03/26/2017 0223   GLUCOSEU NEGATIVE 03/26/2017 0223   HGBUR SMALL (A) 03/26/2017 0223   BILIRUBINUR NEGATIVE 03/26/2017 0223   KETONESUR NEGATIVE 03/26/2017 0223   PROTEINUR 30 (A) 03/26/2017 0223   UROBILINOGEN 2.0 (H)  10/29/2014 1627   NITRITE NEGATIVE 03/26/2017 0223   LEUKOCYTESUR TRACE (A) 03/26/2017 0223   Sepsis Labs: _0 (procalcitonin:4,lacticidven:4)  ) Recent Results (from the past 240 hour(s))  MRSA PCR Screening     Status: None   Collection Time: 04/24/17 11:11 PM  Result Value Ref Range Status   MRSA by PCR NEGATIVE NEGATIVE Final    Comment:        The GeneXpert MRSA Assay (FDA approved for NASAL specimens only), is one component of a comprehensive MRSA colonization surveillance program. It is not intended to diagnose MRSA infection nor to guide or monitor treatment for MRSA infections.       Radiology Studies: Dg Chest 2 View  Result Date: 04/24/2017 CLINICAL DATA:  LEFT arm swelling.  Assess PICC line placement. EXAM: CHEST  2 VIEW COMPARISON:  Chest radiograph March 26, 2017 FINDINGS: Patient rotated to the LEFT. Cardiomediastinal silhouette is normal. LEFT mid and lower lung zone bandlike densities. Blunting of the LEFT costophrenic angle. Retrocardiac consolidation silhouetting the diaphragm with air bronchograms. No pneumothorax. LEFT PICC distal tip projects in proximal superior vena cava,  potentially slightly retracted from prior examination. Punctate calcifications LEFT supraclavicular fossa. Osseous structures are non suspicious. IMPRESSION: LEFT PICC distal tip projects in proximal superior vena cava. Retrocardiac consolidation concerning for pneumonia. LEFT mid and lower lung zone atelectasis/scarring with small LEFT pleural effusion versus pleural thickening. Electronically Signed   By: Elon Alas M.D.   On: 04/24/2017 16:55     Scheduled Meds: . baclofen  20 mg Oral TID  . diazepam  5 mg Oral BID   And  . diazepam  10 mg Oral QHS  . ferrous sulfate  325 mg Oral Q breakfast  . gabapentin  800 mg Oral TID  . lamoTRIgine  50 mg Oral Daily  . levofloxacin  750 mg Oral Daily  . nystatin cream  1 application Topical TID  . pantoprazole  40 mg Oral  Daily  . Rivaroxaban  15 mg Oral BID WC  . [START ON 05/16/2017] rivaroxaban  20 mg Oral Q supper  . sertraline  100 mg Oral BID   Continuous Infusions: . sodium chloride 100 mL/hr at 04/25/17 2221  . eravacycline (XERAVA) IVPB in 150 mL NS Stopped (04/26/17 1245)     LOS: 1 day   Time Spent in minutes   30 minutes  Antoine Vandermeulen D.O. on 04/26/2017 at 2:02 PM  Between 7am to 7pm - Pager - [phone]  After 7pm go to [email or web address] - password [email or web address]  And look for the night coverage person covering for me after hours  [facility] Office  [phone]

## 2017-04-27 LAB — BASIC METABOLIC PANEL
ANION GAP: 8 (ref 5–15)
BUN: 12 mg/dL (ref 6–20)
CALCIUM: 7.3 mg/dL — AB (ref 8.9–10.3)
CO2: 20 mmol/L — AB (ref 22–32)
Chloride: 111 mmol/L (ref 101–111)
GLUCOSE: 76 mg/dL (ref 65–99)
Potassium: 4.7 mmol/L (ref 3.5–5.1)
Sodium: 139 mmol/L (ref 135–145)

## 2017-04-27 LAB — CBC
HEMATOCRIT: 32.5 % — AB (ref 36.0–46.0)
Hemoglobin: 9.8 g/dL — ABNORMAL LOW (ref 12.0–15.0)
MCH: 25.1 pg — AB (ref 26.0–34.0)
MCHC: 30.2 g/dL (ref 30.0–36.0)
MCV: 83.3 fL (ref 78.0–100.0)
Platelets: 165 10*3/uL (ref 150–400)
RBC: 3.9 MIL/uL (ref 3.87–5.11)
RDW: 26.9 % — AB (ref 11.5–15.5)
WBC: 4.9 10*3/uL (ref 4.0–10.5)

## 2017-04-27 NOTE — Progress Notes (Signed)
PROGRESS NOTE    [name]  PVX:[id number] DOB: 1980-10-19 DOA: 04/24/2017 PCP: Lennie Odor, PA-C   Chief Complaint  Patient presents with  . Vascular Access Problem    Brief Narrative:  HPI on 04/24/2017 by Dr. Niel Hummer [name] is a 37 y.o. female with medical history significant of Paraplegia, MS, transverse myelitis, decubitus ulcer left ischial area IV, Osteomyelitis, MDR Acinetobacter, recently discharge from hospital 04-03-2017 treated for sepsis secondary to sacral decubitus osteomyelitis discharge with [facility] line for IV antibiotics Levaquin and Earavacycline for 4 week. She was refer to ED from her Infectious diseases office for evaluation of swelling of left arm. She was found to have DVT age indeterminate of internal jugular, subclavian, axillary and brachial vein on the left.  She report swelling left arm over past several days. She also report fevers, congestion, cough last week which has resolved. She denies abdominal pain. She was told that her wound has been healing.   Assessment & Plan   DVT -Left upper extremity ultrasound showed age-indeterminate DVT involving internal jugular, subclavian, axillary, brachial veins. Age-indeterminate superficial vein thrombosis involving basilic vein -Continue Xarelto  Sacral decubitus osteomyelitis -Followed by infectious disease -history of MDR Acinetobacter, MRSA -Continue IV Eravacycline and oral levaquin through 04/29/17 -Will not reinsert PICC line, will continue these antibiotics during hospitalization -Wound care consulted not appreciated -ESR 0, CRP 1.1  Abnormal chest x-ray -Currently febrile with a leukocytosis -Continue incentive spirometer -Patient states she was sick last week, could be thyroid illness  Multiple sclerosis -Continue supportive care, gabapentin, Valium, baclofen  Depression -Continue Zoloft  Hypokalemia -Resolved, continue monitor BMP  Chronic anemia -Hemoglobin appears to  be at baseline, continue monitor CBC  DVT Prophylaxis  Xarelto   Code Status: Full  Family Communication: Mother at bedside  Disposition Plan: Admitted. Continue antibiotics   Consultants Infectious disease  Procedures  LUE doppler  Antibiotics   Anti-infectives (From admission, onward)   Start     Dose/Rate Route Frequency Ordered Stop   04/25/17 1000  levofloxacin (LEVAQUIN) tablet 750 mg     750 mg Oral Daily 04/24/17 2310 05/02/17 0959   04/24/17 2359  eravacycline (XERAVA) 68 mg in sodium chloride 0.9 % 150 mL IVPB    Comments:  End date: Apr 29, 2017     68 mg 150 mL/hr over 60 Minutes Intravenous 2 times daily 04/24/17 2310 04/30/17 0959      Subjective:   [name] seen and examined today.  Patient has no complaints today, is feeling better.  Denies dizziness, chest pain, shortness of breath, abdominal pain, N/V/D/C.  Objective:   Vitals:   04/26/17 0900 04/26/17 1220 04/26/17 1300 04/27/17 0417  BP: 98/61 117/73  101/61  Pulse: 73 94 97 95  Resp: 18 (!) 23 17 17   Temp: 98.3 F (36.8 C) 97.6 F (36.4 C)  98.1 F (36.7 C)  TempSrc: Oral Oral  Oral  SpO2: 99% 100% 99% 99%  Weight:      Height:        Intake/Output Summary (Last 24 hours) at 04/27/2017 1053 Last data filed at 04/27/2017 1014 Gross per 24 hour  Intake 1980 ml  Output 2025 ml  Net -45 ml   Filed Weights   04/24/17 1546  Weight: 68 kg (150 lb)    Exam (no change from exam on 04/26/2017)  General: Well developed, well nourished, NAD, appears stated age  HEENT: NCAT, mucous membranes moist.   Cardiovascular: S1 S2 auscultated, RRR,  no murmurs  Respiratory: Clear to auscultation bilaterally with equal chest rise  Abdomen: Soft, nontender, nondistended, + bowel sounds. Urostomy   Extremities: warm dry without cyanosis clubbing or edema  Neuro: AAOx3, paraplegia  Psych: Normal affect and demeanor, pleasant    Data Reviewed: I have personally reviewed following labs and  imaging studies  CBC: Recent Labs  Lab 04/24/17 1702 04/25/17 0220 04/26/17 0335 04/27/17 0429  WBC 8.6 6.3 5.2 4.9  NEUTROABS 6.2  --   --   --   HGB 10.7* 9.7* 10.4* 9.8*  HCT 35.9* 31.5* 33.7* 32.5*  MCV 82.0 81.6 82.6 83.3  PLT 165 153 201 630   Basic Metabolic Panel: Recent Labs  Lab 04/24/17 1739 04/25/17 0220 04/26/17 0335 04/27/17 0429  NA 140 141 140 139  K 4.3 3.4* 4.7 4.7  CL 106 110 110 111  CO2 25 23 21* 20*  GLUCOSE 98 90 65 76  BUN 24* 23* 15 12  CREATININE 0.38* 0.33* 0.35* <0.30*  CALCIUM 7.9* 6.9* 7.4* 7.3*   GFR: CrCl cannot be calculated (This lab value cannot be used to calculate CrCl because it is not a number: <0.30). Liver Function Tests: Recent Labs  Lab 04/24/17 1739  AST 35  ALT 25  ALKPHOS 304*  BILITOT 0.6  PROT 3.8*  ALBUMIN 1.7*   No results for input(s): LIPASE, AMYLASE in the last 168 hours. No results for input(s): AMMONIA in the last 168 hours. Coagulation Profile: Recent Labs  Lab 04/24/17 1739  INR 1.13   Cardiac Enzymes: No results for input(s): CKTOTAL, CKMB, CKMBINDEX, TROPONINI in the last 168 hours. BNP (last 3 results) No results for input(s): PROBNP in the last 8760 hours. HbA1C: No results for input(s): HGBA1C in the last 72 hours. CBG: No results for input(s): GLUCAP in the last 168 hours. Lipid Profile: No results for input(s): CHOL, HDL, LDLCALC, TRIG, CHOLHDL, LDLDIRECT in the last 72 hours. Thyroid Function Tests: No results for input(s): TSH, T4TOTAL, FREET4, T3FREE, THYROIDAB in the last 72 hours. Anemia Panel: No results for input(s): VITAMINB12, FOLATE, FERRITIN, TIBC, IRON, RETICCTPCT in the last 72 hours. Urine analysis:    Component Value Date/Time   COLORURINE YELLOW 03/26/2017 0223   APPEARANCEUR CLOUDY (A) 03/26/2017 0223   LABSPEC 1.014 03/26/2017 0223   PHURINE 6.0 03/26/2017 0223   GLUCOSEU NEGATIVE 03/26/2017 0223   HGBUR SMALL (A) 03/26/2017 0223   BILIRUBINUR NEGATIVE  03/26/2017 0223   KETONESUR NEGATIVE 03/26/2017 0223   PROTEINUR 30 (A) 03/26/2017 0223   UROBILINOGEN 2.0 (H) 10/29/2014 1627   NITRITE NEGATIVE 03/26/2017 0223   LEUKOCYTESUR TRACE (A) 03/26/2017 0223   Sepsis Labs: @LABRCNTIP (procalcitonin:4,lacticidven:4)  ) Recent Results (from the past 240 hour(s))  MRSA PCR Screening     Status: None   Collection Time: 04/24/17 11:11 PM  Result Value Ref Range Status   MRSA by PCR NEGATIVE NEGATIVE Final    Comment:        The GeneXpert MRSA Assay (FDA approved for NASAL specimens only), is one component of a comprehensive MRSA colonization surveillance program. It is not intended to diagnose MRSA infection nor to guide or monitor treatment for MRSA infections.       Radiology Studies: No results found.   Scheduled Meds: . baclofen  20 mg Oral TID  . diazepam  5 mg Oral BID   And  . diazepam  10 mg Oral QHS  . ferrous sulfate  325 mg Oral Q breakfast  . gabapentin  800 mg Oral TID  . lamoTRIgine  50 mg Oral Daily  . levofloxacin  750 mg Oral Daily  . nystatin cream  1 application Topical TID  . pantoprazole  40 mg Oral Daily  . Rivaroxaban  15 mg Oral BID WC  . [START ON 05/16/2017] rivaroxaban  20 mg Oral Q supper  . sertraline  100 mg Oral BID   Continuous Infusions: . sodium chloride 100 mL/hr at 04/26/17 2324  . eravacycline (XERAVA) IVPB in 150 mL NS Stopped (04/26/17 2308)     LOS: 2 days   Time Spent in minutes   30 minutes  Rodrickus Min D.O. on 04/27/2017 at 10:53 AM  Between 7am to 7pm - Pager - [phone]  After 7pm go to [email or web address] - password [email or web address]  And look for the night coverage person covering for me after hours  [facility] Office  [phone]

## 2017-04-28 NOTE — Progress Notes (Signed)
PROGRESS NOTE    [name]  INO:[id number] DOB: 01-09-1981 DOA: 04/24/2017 PCP: Lennie Odor, PA-C   Chief Complaint  Patient presents with  . Vascular Access Problem    Brief Narrative:  HPI on 04/24/2017 by Dr. Niel Hummer [name] is a 37 y.o. female with medical history significant of Paraplegia, MS, transverse myelitis, decubitus ulcer left ischial area IV, Osteomyelitis, MDR Acinetobacter, recently discharge from hospital 04-03-2017 treated for sepsis secondary to sacral decubitus osteomyelitis discharge with [facility] line for IV antibiotics Levaquin and Earavacycline for 4 week. She was refer to ED from her Infectious diseases office for evaluation of swelling of left arm. She was found to have DVT age indeterminate of internal jugular, subclavian, axillary and brachial vein on the left.  She report swelling left arm over past several days. She also report fevers, congestion, cough last week which has resolved. She denies abdominal pain. She was told that her wound has been healing.   Assessment & Plan   DVT -Left upper extremity ultrasound showed age-indeterminate DVT involving internal jugular, subclavian, axillary, brachial veins. Age-indeterminate superficial vein thrombosis involving basilic vein -Continue Xarelto  Sacral decubitus osteomyelitis -Followed by infectious disease -history of MDR Acinetobacter, MRSA -Continue IV Eravacycline and oral levaquin through 04/29/17 -Will not reinsert PICC line, will continue these antibiotics during hospitalization -Wound care consulted not appreciated -ESR 0, CRP 1.1  Abnormal chest x-ray -Currently febrile with a leukocytosis -Continue incentive spirometer -Patient states she was sick last week, could be thyroid illness  Multiple sclerosis -Continue supportive care, gabapentin, Valium, baclofen  Depression -Continue Zoloft  Hypokalemia -Resolved, continue monitor BMP  Chronic anemia -Hemoglobin appears to  be at baseline, continue monitor CBC  DVT Prophylaxis  Xarelto   Code Status: Full  Family Communication: Mother at bedside  Disposition Plan: Admitted. Continue antibiotics through 04/29/2017. Possible discharge back to SNF on 1/28 or 04/30/17  Consultants Infectious disease  Procedures  LUE doppler  Antibiotics   Anti-infectives (From admission, onward)   Start     Dose/Rate Route Frequency Ordered Stop   04/25/17 1000  levofloxacin (LEVAQUIN) tablet 750 mg     750 mg Oral Daily 04/24/17 2310 05/02/17 0959   04/24/17 2359  eravacycline (XERAVA) 68 mg in sodium chloride 0.9 % 150 mL IVPB    Comments:  End date: Apr 29, 2017     68 mg 150 mL/hr over 60 Minutes Intravenous 2 times daily 04/24/17 2310 04/30/17 0959      Subjective:   [name] seen and examined today.  Patient has no complaints today, is feeling better. Wonders if her blood clot is the cause of her cold like symptoms.  Denies dizziness, chest pain, shortness of breath, abdominal pain, N/V/D/C.  Objective:   Vitals:   04/27/17 0417 04/27/17 1656 04/27/17 2120 04/28/17 0436  BP: 101/61 118/68 97/83 (!) 93/56  Pulse: 95 97 94 87  Resp: 17 18 14 13   Temp: 98.1 F (36.7 C) 98.4 F (36.9 C) 98.4 F (36.9 C) 98 F (36.7 C)  TempSrc: Oral Oral Oral Oral  SpO2: 99% 99% 98% 97%  Weight:      Height:        Intake/Output Summary (Last 24 hours) at 04/28/2017 1035 Last data filed at 04/28/2017 0807 Gross per 24 hour  Intake 820 ml  Output 3510 ml  Net -2690 ml   Filed Weights   04/24/17 1546  Weight: 68 kg (150 lb)    Exam (no change from  exam on 04/26/2017)  General: Well developed, well nourished, NAD, appears stated age  HEENT: NCAT, mucous membranes moist.   Cardiovascular: S1 S2 auscultated, RRR, no murmurs  Respiratory: Clear to auscultation bilaterally with equal chest rise  Abdomen: Soft, nontender, nondistended, + bowel sounds. Urostomy   Extremities: warm dry without cyanosis  clubbing or edema  Neuro: AAOx3, paraplegia  Psych: Normal affect and demeanor, pleasant    Data Reviewed: I have personally reviewed following labs and imaging studies  CBC: Recent Labs  Lab 04/24/17 1702 04/25/17 0220 04/26/17 0335 04/27/17 0429  WBC 8.6 6.3 5.2 4.9  NEUTROABS 6.2  --   --   --   HGB 10.7* 9.7* 10.4* 9.8*  HCT 35.9* 31.5* 33.7* 32.5*  MCV 82.0 81.6 82.6 83.3  PLT 165 153 201 244   Basic Metabolic Panel: Recent Labs  Lab 04/24/17 1739 04/25/17 0220 04/26/17 0335 04/27/17 0429  NA 140 141 140 139  K 4.3 3.4* 4.7 4.7  CL 106 110 110 111  CO2 25 23 21* 20*  GLUCOSE 98 90 65 76  BUN 24* 23* 15 12  CREATININE 0.38* 0.33* 0.35* <0.30*  CALCIUM 7.9* 6.9* 7.4* 7.3*   GFR: CrCl cannot be calculated (This lab value cannot be used to calculate CrCl because it is not a number: <0.30). Liver Function Tests: Recent Labs  Lab 04/24/17 1739  AST 35  ALT 25  ALKPHOS 304*  BILITOT 0.6  PROT 3.8*  ALBUMIN 1.7*   No results for input(s): LIPASE, AMYLASE in the last 168 hours. No results for input(s): AMMONIA in the last 168 hours. Coagulation Profile: Recent Labs  Lab 04/24/17 1739  INR 1.13   Cardiac Enzymes: No results for input(s): CKTOTAL, CKMB, CKMBINDEX, TROPONINI in the last 168 hours. BNP (last 3 results) No results for input(s): PROBNP in the last 8760 hours. HbA1C: No results for input(s): HGBA1C in the last 72 hours. CBG: No results for input(s): GLUCAP in the last 168 hours. Lipid Profile: No results for input(s): CHOL, HDL, LDLCALC, TRIG, CHOLHDL, LDLDIRECT in the last 72 hours. Thyroid Function Tests: No results for input(s): TSH, T4TOTAL, FREET4, T3FREE, THYROIDAB in the last 72 hours. Anemia Panel: No results for input(s): VITAMINB12, FOLATE, FERRITIN, TIBC, IRON, RETICCTPCT in the last 72 hours. Urine analysis:    Component Value Date/Time   COLORURINE YELLOW 03/26/2017 0223   APPEARANCEUR CLOUDY (A) 03/26/2017 0223    LABSPEC 1.014 03/26/2017 0223   PHURINE 6.0 03/26/2017 0223   GLUCOSEU NEGATIVE 03/26/2017 0223   HGBUR SMALL (A) 03/26/2017 0223   BILIRUBINUR NEGATIVE 03/26/2017 0223   KETONESUR NEGATIVE 03/26/2017 0223   PROTEINUR 30 (A) 03/26/2017 0223   UROBILINOGEN 2.0 (H) 10/29/2014 1627   NITRITE NEGATIVE 03/26/2017 0223   LEUKOCYTESUR TRACE (A) 03/26/2017 0223   Sepsis Labs: '@LABRCNTIP'$ (procalcitonin:4,lacticidven:4)  ) Recent Results (from the past 240 hour(s))  MRSA PCR Screening     Status: None   Collection Time: 04/24/17 11:11 PM  Result Value Ref Range Status   MRSA by PCR NEGATIVE NEGATIVE Final    Comment:        The GeneXpert MRSA Assay (FDA approved for NASAL specimens only), is one component of a comprehensive MRSA colonization surveillance program. It is not intended to diagnose MRSA infection nor to guide or monitor treatment for MRSA infections.       Radiology Studies: No results found.   Scheduled Meds: . baclofen  20 mg Oral TID  . diazepam  5 mg Oral  BID   And  . diazepam  10 mg Oral QHS  . ferrous sulfate  325 mg Oral Q breakfast  . gabapentin  800 mg Oral TID  . lamoTRIgine  50 mg Oral Daily  . levofloxacin  750 mg Oral Daily  . nystatin cream  1 application Topical TID  . pantoprazole  40 mg Oral Daily  . Rivaroxaban  15 mg Oral BID WC  . [START ON 05/16/2017] rivaroxaban  20 mg Oral Q supper  . sertraline  100 mg Oral BID   Continuous Infusions: . sodium chloride 100 mL/hr at 04/28/17 0617  . eravacycline (XERAVA) IVPB in 150 mL NS Stopped (04/28/17 0057)     LOS: 3 days   Time Spent in minutes   30 minutes  [name] D.O. on 04/28/2017 at 10:35 AM  Between 7am to 7pm - Pager - [phone]  After 7pm go to [email or web address] - password [email or web address]  And look for the night coverage person covering for me after hours  [facility] Office  [phone]

## 2017-04-29 MED ORDER — PROMETHAZINE HCL 25 MG/ML IJ SOLN
12.5000 mg | Freq: Four times a day (QID) | INTRAMUSCULAR | Status: DC | PRN
  Administered 2017-04-29: 12.5 mg via INTRAVENOUS
  Filled 2017-04-29: qty 1

## 2017-04-29 NOTE — Progress Notes (Signed)
PROGRESS NOTE    [name]  YJE:[id number] DOB: Jan 26, 1981 DOA: 04/24/2017 PCP: Lennie Odor, PA-C   Chief Complaint  Patient presents with  . Vascular Access Problem    Brief Narrative:  HPI on 04/24/2017 by Dr. Niel Hummer [name] is a 37 y.o. female with medical history significant of Paraplegia, MS, transverse myelitis, decubitus ulcer left ischial area IV, Osteomyelitis, MDR Acinetobacter, recently discharge from hospital 04-03-2017 treated for sepsis secondary to sacral decubitus osteomyelitis discharge with [facility] line for IV antibiotics Levaquin and Earavacycline for 4 week. She was refer to ED from her Infectious diseases office for evaluation of swelling of left arm. She was found to have DVT age indeterminate of internal jugular, subclavian, axillary and brachial vein on the left.  She report swelling left arm over past several days. She also report fevers, congestion, cough last week which has resolved. She denies abdominal pain. She was told that her wound has been healing.   Assessment & Plan   DVT -Left upper extremity ultrasound showed age-indeterminate DVT involving internal jugular, subclavian, axillary, brachial veins. Age-indeterminate superficial vein thrombosis involving basilic vein -Continue Xarelto  Sacral decubitus osteomyelitis -Followed by infectious disease -history of MDR Acinetobacter, MRSA -Continue IV Eravacycline and oral levaquin through 04/29/17 -Will not reinsert PICC line, will continue these antibiotics during hospitalization -Wound care consulted not appreciated -ESR 0, CRP 1.1  Abnormal chest x-ray -Currently febrile with a leukocytosis -Continue incentive spirometer -Patient states she was sick last week, could be thyroid illness  Multiple sclerosis -Continue supportive care, gabapentin, Valium, baclofen  Depression -Continue Zoloft  Hypokalemia -Resolved, continue monitor BMP  Chronic anemia -Hemoglobin appears to  be at baseline, continue monitor CBC periodically  DVT Prophylaxis  Xarelto   Code Status: Full  Family Communication: Mother at bedside  Disposition Plan: Admitted. Continue antibiotics through 04/29/2017. [facility] 04/30/17  Consultants Infectious disease  Procedures  LUE doppler  Antibiotics   Anti-infectives (From admission, onward)   Start     Dose/Rate Route Frequency Ordered Stop   04/25/17 1000  levofloxacin (LEVAQUIN) tablet 750 mg     750 mg Oral Daily 04/24/17 2310 04/29/17 0904   04/24/17 2359  eravacycline (XERAVA) 68 mg in sodium chloride 0.9 % 150 mL IVPB    Comments:  End date: Apr 29, 2017     68 mg 150 mL/hr over 60 Minutes Intravenous 2 times daily 04/24/17 2310 04/30/17 0959      Subjective:   [name] seen and examined today.  No complaints today. Denies chest pain, shortness of breath, abdominal pain, N/V/D/C.  Objective:   Vitals:   04/29/17 0549 04/29/17 0906 04/29/17 0911 04/29/17 1034  BP: 102/68 (!) 89/56 94/61 122/78  Pulse: 82 79 85 87  Resp: 14 12 17 17   Temp: (!) 97.4 F (36.3 C) 97.7 F (36.5 C)    TempSrc: Oral Oral    SpO2: 99% 98% 99% 99%  Weight:      Height:        Intake/Output Summary (Last 24 hours) at 04/29/2017 1045 Last data filed at 04/29/2017 0800 Gross per 24 hour  Intake 840 ml  Output 2483 ml  Net -1643 ml   Filed Weights   04/24/17 1546  Weight: 68 kg (150 lb)    Exam (no change from exam on 04/26/2017)  General: Well developed, well nourished, NAD, appears stated age  HEENT: NCAT, mucous membranes moist.   Cardiovascular: S1 S2 auscultated, RRR, no  murmurs  Respiratory: Clear to auscultation bilaterally with equal chest rise  Abdomen: Soft, nontender, nondistended, + bowel sounds. Urostomy   Extremities: warm dry without cyanosis clubbing or edema  Neuro: AAOx3, paraplegia  Psych: Normal affect and demeanor, pleasant    Data Reviewed: I have personally reviewed  following labs and imaging studies  CBC: Recent Labs  Lab 04/24/17 1702 04/25/17 0220 04/26/17 0335 04/27/17 0429  WBC 8.6 6.3 5.2 4.9  NEUTROABS 6.2  --   --   --   HGB 10.7* 9.7* 10.4* 9.8*  HCT 35.9* 31.5* 33.7* 32.5*  MCV 82.0 81.6 82.6 83.3  PLT 165 153 201 630   Basic Metabolic Panel: Recent Labs  Lab 04/24/17 1739 04/25/17 0220 04/26/17 0335 04/27/17 0429  NA 140 141 140 139  K 4.3 3.4* 4.7 4.7  CL 106 110 110 111  CO2 25 23 21* 20*  GLUCOSE 98 90 65 76  BUN 24* 23* 15 12  CREATININE 0.38* 0.33* 0.35* <0.30*  CALCIUM 7.9* 6.9* 7.4* 7.3*   GFR: CrCl cannot be calculated (This lab value cannot be used to calculate CrCl because it is not a number: <0.30). Liver Function Tests: Recent Labs  Lab 04/24/17 1739  AST 35  ALT 25  ALKPHOS 304*  BILITOT 0.6  PROT 3.8*  ALBUMIN 1.7*   No results for input(s): LIPASE, AMYLASE in the last 168 hours. No results for input(s): AMMONIA in the last 168 hours. Coagulation Profile: Recent Labs  Lab 04/24/17 1739  INR 1.13   Cardiac Enzymes: No results for input(s): CKTOTAL, CKMB, CKMBINDEX, TROPONINI in the last 168 hours. BNP (last 3 results) No results for input(s): PROBNP in the last 8760 hours. HbA1C: No results for input(s): HGBA1C in the last 72 hours. CBG: No results for input(s): GLUCAP in the last 168 hours. Lipid Profile: No results for input(s): CHOL, HDL, LDLCALC, TRIG, CHOLHDL, LDLDIRECT in the last 72 hours. Thyroid Function Tests: No results for input(s): TSH, T4TOTAL, FREET4, T3FREE, THYROIDAB in the last 72 hours. Anemia Panel: No results for input(s): VITAMINB12, FOLATE, FERRITIN, TIBC, IRON, RETICCTPCT in the last 72 hours. Urine analysis:    Component Value Date/Time   COLORURINE YELLOW 03/26/2017 0223   APPEARANCEUR CLOUDY (A) 03/26/2017 0223   LABSPEC 1.014 03/26/2017 0223   PHURINE 6.0 03/26/2017 0223   GLUCOSEU NEGATIVE 03/26/2017 0223   HGBUR SMALL (A) 03/26/2017 0223    BILIRUBINUR NEGATIVE 03/26/2017 0223   KETONESUR NEGATIVE 03/26/2017 0223   PROTEINUR 30 (A) 03/26/2017 0223   UROBILINOGEN 2.0 (H) 10/29/2014 1627   NITRITE NEGATIVE 03/26/2017 0223   LEUKOCYTESUR TRACE (A) 03/26/2017 0223   Sepsis Labs: '@LABRCNTIP'$ (procalcitonin:4,lacticidven:4)  ) Recent Results (from the past 240 hour(s))  MRSA PCR Screening     Status: None   Collection Time: 04/24/17 11:11 PM  Result Value Ref Range Status   MRSA by PCR NEGATIVE NEGATIVE Final    Comment:        The GeneXpert MRSA Assay (FDA approved for NASAL specimens only), is one component of a comprehensive MRSA colonization surveillance program. It is not intended to diagnose MRSA infection nor to guide or monitor treatment for MRSA infections.       Radiology Studies: No results found.   Scheduled Meds: . baclofen  20 mg Oral TID  . diazepam  5 mg Oral BID   And  . diazepam  10 mg Oral QHS  . ferrous sulfate  325 mg Oral Q breakfast  . gabapentin  800  mg Oral TID  . lamoTRIgine  50 mg Oral Daily  . nystatin cream  1 application Topical TID  . pantoprazole  40 mg Oral Daily  . Rivaroxaban  15 mg Oral BID WC  . [START ON 05/16/2017] rivaroxaban  20 mg Oral Q supper  . sertraline  100 mg Oral BID   Continuous Infusions: . sodium chloride 100 mL/hr at 04/29/17 0616  . eravacycline (XERAVA) IVPB in 150 mL NS 68 mg (04/29/17 1026)     LOS: 4 days   Time Spent in minutes   30 minutes  Omunique Pederson D.O. on 04/29/2017 at 10:45 AM  Between 7am to 7pm - Pager - [phone]  After 7pm go to [email or web address] - password [email or web address]  And look for the night coverage person covering for me after hours  [facility] Office  [phone]

## 2017-04-30 LAB — HEMOGLOBIN AND HEMATOCRIT, BLOOD
HCT: 33.2 % — ABNORMAL LOW (ref 36.0–46.0)
Hemoglobin: 10.4 g/dL — ABNORMAL LOW (ref 12.0–15.0)

## 2017-04-30 LAB — BASIC METABOLIC PANEL
ANION GAP: 7 (ref 5–15)
BUN: 10 mg/dL (ref 6–20)
CHLORIDE: 110 mmol/L (ref 101–111)
CO2: 23 mmol/L (ref 22–32)
Calcium: 7.2 mg/dL — ABNORMAL LOW (ref 8.9–10.3)
Creatinine, Ser: 0.33 mg/dL — ABNORMAL LOW (ref 0.44–1.00)
GFR calc non Af Amer: >60 mL/min (ref >60)
GLUCOSE: 71 mg/dL (ref 65–99)
POTASSIUM: 4.1 mmol/L (ref 3.5–5.1)
Sodium: 140 mmol/L (ref 135–145)

## 2017-04-30 MED ORDER — RIVAROXABAN (XARELTO) VTE STARTER PACK (15 & 20 MG): ORAL_TABLET | ORAL | 0 refills | Status: DC

## 2017-04-30 MED ORDER — DIAZEPAM 5 MG PO TABS: 5.0000 mg | ORAL_TABLET | Freq: Three times a day (TID) | ORAL | 0 refills | Status: DC

## 2017-04-30 NOTE — Clinical Social Work Placement (Signed)
   CLINICAL SOCIAL WORK PLACEMENT  NOTE  Date:  04/30/2017  Patient Details  Name: [name] MRN: [id number] Date of Birth: Dec 28, 1980  Clinical Social Work is seeking post-discharge placement for this patient at the [place] level of care (*CSW will initial, date and re-position this form in  chart as items are completed):      Patient/family provided with [facility] Work Department's list of facilities offering this level of care within the geographic area requested by the patient (or if unable, by the patient's family).  Yes   Patient/family informed of their freedom to choose among providers that offer the needed level of care, that participate in Medicare, Medicaid or managed care program needed by the patient, have an available bed and are willing to accept the patient.      Patient/family informed of [facility]'s ownership interest in [facility] and [facility], as well as of the fact that they are under no obligation to receive care at these facilities.  PASRR submitted to EDS on       PASRR number received on       Existing PASRR number confirmed on 04/26/17     FL2 transmitted to all facilities in geographic area requested by pt/family on 04/26/17     FL2 transmitted to all facilities within larger geographic area on 04/30/17     Patient informed that his/her managed care company has contracts with or will negotiate with certain facilities, including the following:        Yes   Patient/family informed of bed offers received.  Patient chooses bed at [facility]     Physician recommends and patient chooses bed at      Patient to be transferred to [facility] on 04/30/17.  Patient to be transferred to facility by [facility]     Patient family notified on 04/30/17 of transfer.  Name of family member notified:  contacted mother via phone and left voicemail      PHYSICIAN       Additional Comment:     _______________________________________________ Wende Neighbors, LCSW 04/30/2017, 10:44 AM

## 2017-04-30 NOTE — Discharge Summary (Addendum)
Physician Discharge Summary  [name] MWU:[id number] DOB: 12-15-1980 DOA: 04/24/2017  PCP: Lennie Odor, PA-C  Admit date: 04/24/2017 Discharge date: 04/30/2017  Time spent: 45 minutes  Recommendations for Outpatient Follow-up:  Patient will be discharged to skilled nursing facility.  Continue therapy as tolerated.  Patient will need to follow up with primary care provider within one week of discharge.  Follow-up with infectious disease as scheduled.  Patient should continue medications as prescribed.  Patient should follow a regular diet.   Discharge Diagnoses:  DVT Sacral decubitus osteomyelitis Abnormal chest x-ray Multiple sclerosis Depression Hypokalemia Chronic anemia  Discharge Condition: Stable  Diet recommendation: Regular  Filed Weights   04/24/17 1546  Weight: 68 kg (150 lb)    History of present illness:  on 04/24/2017 by Dr. Niel Hummer Veronica E Myersis a 37 y.o.femalewith medical history significant ofParaplegia, MS, transverse myelitis, decubitus ulcer left ischial area IV, Osteomyelitis, MDR Acinetobacter, recently discharge from hospital 04-03-2017 treated for sepsis secondary to sacral decubitus osteomyelitis discharge with [facility] line for IV antibiotics Levaquin and Earavacycline for 4 week. She was refer to ED from her Infectious diseases office for evaluation of swelling of left arm. She was found to have DVT age indeterminate of internal jugular, subclavian, axillary and brachial vein on the left. She report swelling left arm over past several days. She also report fevers, congestion, cough last week which has resolved. She denies abdominal pain. She was told that her wound has been healing.  Hospital Course:  DVT -Left upper extremity ultrasound showed age-indeterminate DVT involving internal jugular, subclavian, axillary, brachial veins. Age-indeterminate superficial vein thrombosis involving basilic vein -Continue Xarelto, continue 21m  BID through 05/15/2017. Starting 05/16/2017 start Xarelto 26mdaily.  Sacral decubitus osteomyelitis -Followed by infectious disease -history of MDR Acinetobacter, MRSA -Completed IV Eravacycline and oral levaquin through 04/29/17 -Will not reinsert PICC line, will continue these antibiotics during hospitalization -Wound care consulted not appreciated -ESR 0, CRP 1.1  Abnormal chest x-ray -Currently febrile with a leukocytosis -Continue incentive spirometer -Patient states she was sick last week, could be thyroid illness  Multiple sclerosis -Continue supportive care, gabapentin, Valium, baclofen  Depression -Continue Zoloft  Hypokalemia -Resolved, continue monitor BMP  Chronic anemia -Hemoglobin appears to be at baseline, continue monitor CBC periodically  Consultants Infectious disease  Procedures  LUE doppler  Discharge Exam: Vitals:   04/30/17 0231 04/30/17 0800  BP: 91/66 108/64  Pulse: 81 79  Resp: 18   Temp: 97.7 F (36.5 C) 97.6 F (36.4 C)  SpO2: 99% 100%     General: Well developed, well nourished, NAD, appears stated age  HEENT: NCAT, mucous membranes moist.  Cardiovascular: S1 S2 auscultated, no rubs, murmurs or gallops. Regular rate and rhythm.  Respiratory: Clear to auscultation bilaterally with equal chest rise  Abdomen: Soft, nontender, nondistended, + bowel sounds  Extremities: warm dry without cyanosis clubbing or edema  Neuro: AAOx3, nonfocal, paraplegia  Psych: appropriate mood and affect  Discharge Instructions Discharge Instructions    Discharge instructions   Complete by:  As directed    Patient will be discharged to skilled nursing facility.  Continue therapy as tolerated.  Patient will need to follow up with primary care provider within one week of discharge.  Follow-up with infectious disease as scheduled.  Patient should continue medications as prescribed.  Patient should follow a regular diet.     Allergies as of  04/30/2017      Reactions   Iodine Anaphylaxis   Latex Anaphylaxis  Polymyxin B Hives   Pt had hives in right arm with infusion of polymyxin on November 26th, 2018   Shellfish-derived Products Anaphylaxis   Reaction unknown   Vancomycin Anaphylaxis   Aspirin Other (See Comments)   Reaction unknown   Strawberry Extract Hives, Rash      Medication List    STOP taking these medications   levofloxacin 750 MG tablet Commonly known as:  LEVAQUIN   sodium chloride 0.9 % SOLN 150 mL with eravacycline 50 MG SOLR 68 mg     TAKE these medications   acetaminophen 325 MG tablet Commonly known as:  TYLENOL Take 650 mg by mouth every 6 (six) hours as needed for fever.   baclofen 20 MG tablet Commonly known as:  LIORESAL Take 20 mg by mouth 3 (three) times daily.   bisacodyl 10 MG suppository Commonly known as:  DULCOLAX Place 1 suppository (10 mg total) daily rectally. What changed:    when to take this  reasons to take this   diazepam 5 MG tablet Commonly known as:  VALIUM Take 1-2 tablets (5-10 mg total) by mouth 3 (three) times daily. Take 1 tablet in AM, 1 tablet in PM, and 2 tablets at bedtime   diphenhydrAMINE 25 mg capsule Commonly known as:  BENADRYL Take 1 capsule (25 mg total) by mouth every 8 (eight) hours as needed for itching. What changed:    how much to take  when to take this   ferrous sulfate 325 (65 FE) MG tablet Take 1 tablet (325 mg total) by mouth daily with breakfast.   fluticasone 50 MCG/ACT nasal spray Commonly known as:  FLONASE Place 2 sprays into the nose daily as needed for allergies.   gabapentin 400 MG capsule Commonly known as:  NEURONTIN Take 2 capsules (800 mg total) by mouth 3 (three) times daily.   ibuprofen 200 MG tablet Commonly known as:  ADVIL,MOTRIN Take 200-400 mg by mouth every 6 (six) hours as needed for headache or moderate pain.   lamoTRIgine 25 MG tablet Commonly known as:  LAMICTAL Take 2 tablets (50 mg total) by  mouth daily.   loperamide 2 MG capsule Commonly known as:  IMODIUM Take 2 capsules (4 mg total) by mouth 3 (three) times daily as needed for diarrhea or loose stools.   loratadine 5 MG/5ML syrup Commonly known as:  CLARITIN Take 5 mg daily as needed by mouth for allergies.   nystatin cream Commonly known as:  MYCOSTATIN Apply 1 application topically 3 (three) times daily.   omeprazole 40 MG capsule Commonly known as:  PRILOSEC Take 40 mg by mouth daily.   ondansetron 4 MG disintegrating tablet Commonly known as:  ZOFRAN-ODT Take 1 tablet (4 mg total) by mouth every 8 (eight) hours as needed for nausea or vomiting. What changed:  when to take this   Rivaroxaban 15 & 20 MG Tbpk Take as directed on package: Start with one 75m tablet by mouth twice a day with food. On 05/16/2017, switch to one 24mtablet once a day with food.   sertraline 100 MG tablet Commonly known as:  ZOLOFT Take 1 tablet (100 mg total) by mouth 2 (two) times daily.      Allergies  Allergen Reactions  . Iodine Anaphylaxis  . Latex Anaphylaxis  . Polymyxin B Hives    Pt had hives in right arm with infusion of polymyxin on November 26th, 2018  . Shellfish-Derived Products Anaphylaxis    Reaction unknown  . Vancomycin Anaphylaxis  .  Aspirin Other (See Comments)    Reaction unknown  . Strawberry Extract Hives and Rash    Contact information for follow-up providers    Redmon, Noelle, PA-C. Schedule an appointment as soon as possible for a visit in 1 week(s).   Specialty:  Nurse Practitioner Why:  Hospital follow up Contact information: [place]. [employer] [place] [address] [phone]        Tommy Medal, Lavell Islam, MD. Go to.   Specialty:  Infectious Diseases Why:  schedule appointment Contact information: [address] [address] [phone]            Contact information for after-discharge care    [facility] Follow up.     Service:  Skilled Nursing Contact information: [place] [phone]                   The results of significant diagnostics from this hospitalization (including imaging, microbiology, ancillary and laboratory) are listed below for reference.    Significant Diagnostic Studies: Dg Chest 2 View  Result Date: 04/24/2017 CLINICAL DATA:  LEFT arm swelling.  Assess PICC line placement. EXAM: CHEST  2 VIEW COMPARISON:  Chest radiograph March 26, 2017 FINDINGS: Patient rotated to the LEFT. Cardiomediastinal silhouette is normal. LEFT mid and lower lung zone bandlike densities. Blunting of the LEFT costophrenic angle. Retrocardiac consolidation silhouetting the diaphragm with air bronchograms. No pneumothorax. LEFT PICC distal tip projects in proximal superior vena cava, potentially slightly retracted from prior examination. Punctate calcifications LEFT supraclavicular fossa. Osseous structures are non suspicious. IMPRESSION: LEFT PICC distal tip projects in proximal superior vena cava. Retrocardiac consolidation concerning for pneumonia. LEFT mid and lower lung zone atelectasis/scarring with small LEFT pleural effusion versus pleural thickening. Electronically Signed   By: Elon Alas M.D.   On: 04/24/2017 16:55    Microbiology: Recent Results (from the past 240 hour(s))  MRSA PCR Screening     Status: None   Collection Time: 04/24/17 11:11 PM  Result Value Ref Range Status   MRSA by PCR NEGATIVE NEGATIVE Final    Comment:        The GeneXpert MRSA Assay (FDA approved for NASAL specimens only), is one component of a comprehensive MRSA colonization surveillance program. It is not intended to diagnose MRSA infection nor to guide or monitor treatment for MRSA infections.      Labs: Basic Metabolic Panel: Recent Labs  Lab 04/24/17 1739 04/25/17 0220 04/26/17 0335 04/27/17 0429 04/30/17 0350  NA 140 141 140 139 140  K 4.3  3.4* 4.7 4.7 4.1  CL 106 110 110 111 110  CO2 25 23 21* 20* 23  GLUCOSE 98 90 65 76 71  BUN 24* 23* _0 CREATININE 0.38* 0.33* 0.35* <0.30* 0.33*  CALCIUM 7.9* 6.9* 7.4* 7.3* 7.2*   Liver Function Tests: Recent Labs  Lab 04/24/17 1739  AST 35  ALT 25  ALKPHOS 304*  BILITOT 0.6  PROT 3.8*  ALBUMIN 1.7*   No results for input(s): LIPASE, AMYLASE in the last 168 hours. No results for input(s): AMMONIA in the last 168 hours. CBC: Recent Labs  Lab 04/24/17 1702 04/25/17 0220 04/26/17 0335 04/27/17 0429 04/30/17 0350  WBC 8.6 6.3 5.2 4.9  --   NEUTROABS 6.2  --   --   --   --   HGB 10.7* 9.7* 10.4* 9.8* 10.4*  HCT 35.9* 31.5* 33.7* 32.5*  33.2*  MCV 82.0 81.6 82.6 83.3  --   PLT 165 153 201 165  --    Cardiac Enzymes: No results for input(s): CKTOTAL, CKMB, CKMBINDEX, TROPONINI in the last 168 hours. BNP: BNP (last 3 results) No results for input(s): BNP in the last 8760 hours.  ProBNP (last 3 results) No results for input(s): PROBNP in the last 8760 hours.  CBG: No results for input(s): GLUCAP in the last 168 hours.     Signed:  Cristal Ford  [facility] 04/30/2017, 10:07 AM

## 2017-04-30 NOTE — Progress Notes (Signed)
Patient in a stable condition, discharge education reviewed with patient and her mother at bedside, they verbalized understanding, iv removed, tele dc ccmd notified, patient belongings at bedside, this RN called [facility] nurse to give report, [facility] nurse unavailable to take report they promised to call this RN to get report, [facility] at bedside to transport patient.

## 2017-04-30 NOTE — Progress Notes (Signed)
Clinical Social Worker facilitated patient discharge including contacting patient family and facility to confirm patient discharge plans.  Clinical information faxed to facility and family agreeable with plan.  CSW arranged ambulance transport via [facility] to [facility]  .  RN to call [phone] for report prior to discharge.  Clinical Social Worker will sign off for now as social work intervention is no longer needed. Please consult [place] again if new need arises.  Rhea Pink, MSW, [place]

## 2017-04-30 NOTE — Care Management Note (Signed)
Case Management Note Marvetta Gibbons RN, BSN Unit 4E-Case Manager [phone]  Patient Details  Name: [name] MRN: [id number] Date of Birth: 07-03-1980  Subjective/Objective:   Pt admitted with DVT                 Action/Plan: PTA pt was at [facility] for IV abx - plan to return to SNF at discharge- pt will complete IV ab here on 1/28- CSW following for return to SNF when medically cleared  Expected Discharge Date:  04/30/17               Expected Discharge Plan:  [place]  In-House Referral:  Clinical Social Work  Discharge planning Services  CM Consult  Post Acute Care Choice:    Choice offered to:     DME Arranged:    DME Agency:     HH Arranged:    [place] Agency:     Status of Service:  Completed, signed off  If discussed at [employer], dates discussed:    Discharge Disposition: skilled facility  Additional Comments:  04/30/17- 27- Sarrinah Gardin RN, CM- pt for d/c back to [facility] today- CSW following for placement needs  Dawayne Patricia, RN 04/30/2017, 11:01 AM

## 2017-04-30 NOTE — Progress Notes (Signed)
Report called to nurse Cloyd Stagers at [facility]

## 2017-05-13 ENCOUNTER — Telehealth: Payer: Self-pay | Admitting: Neurology

## 2017-05-13 NOTE — Telephone Encounter (Signed)
LM for [name] advising if they wanted to wait until discharged from rehab, that is understandable.

## 2017-05-13 NOTE — Telephone Encounter (Signed)
Patient mother wants to know if patient needs to come in on Friday she has been in the hospital and now id in a rehab center

## 2017-05-14 ENCOUNTER — Telehealth: Payer: Self-pay | Admitting: Neurology

## 2017-05-14 NOTE — Telephone Encounter (Signed)
Called and spoke with [name]. Advsd her I did rtrn her call yesterday and LM on her VM indicating ok to wait until discharged from rehab. Trx her to reception to r/s appt

## 2017-05-14 NOTE — Telephone Encounter (Signed)
[name] called and wanted to know if pt should keep her appointment on Friday she has been in and out of the hospital and is now in rehab, please call and advise

## 2017-05-17 ENCOUNTER — Ambulatory Visit: Payer: Medicaid Other | Admitting: Neurology

## 2017-05-27 ENCOUNTER — Ambulatory Visit (INDEPENDENT_AMBULATORY_CARE_PROVIDER_SITE_OTHER): Payer: Medicaid Other | Admitting: Infectious Disease

## 2017-05-27 VITALS — BP 122/64 | HR 88 | Temp 97.5°F | Resp 16

## 2017-05-27 DIAGNOSIS — A4902 Methicillin resistant Staphylococcus aureus infection, unspecified site: Secondary | ICD-10-CM | POA: Diagnosis not present

## 2017-05-27 DIAGNOSIS — G35 Multiple sclerosis: Secondary | ICD-10-CM

## 2017-05-27 DIAGNOSIS — M869 Osteomyelitis, unspecified: Secondary | ICD-10-CM

## 2017-05-27 DIAGNOSIS — L89323 Pressure ulcer of left buttock, stage 3: Secondary | ICD-10-CM | POA: Diagnosis not present

## 2017-05-27 DIAGNOSIS — A498 Other bacterial infections of unspecified site: Secondary | ICD-10-CM

## 2017-05-27 DIAGNOSIS — A499 Bacterial infection, unspecified: Secondary | ICD-10-CM

## 2017-05-27 DIAGNOSIS — Z1624 Resistance to multiple antibiotics: Secondary | ICD-10-CM | POA: Diagnosis not present

## 2017-05-27 DIAGNOSIS — I82622 Acute embolism and thrombosis of deep veins of left upper extremity: Secondary | ICD-10-CM | POA: Diagnosis not present

## 2017-05-27 NOTE — Progress Notes (Signed)
Subjective:   Cc: no new complaints followup for pelvic osteomyelitis   Patient ID: [name], female    DOB: May 21, 1980, 37 y.o.   MRN: [id number]  HPI  37 y.o. 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 I switched her to Eravacycline and levaquin.  She is nearly done with 4 weeks of therarpy with these drugs and per WOC at facility having improvement in appearance of granulation tissue.   At last visit she was suffering from intertrigo that has not resolved. She also was still having liquid stools that were soiling wound at times. She was found to have DVT and we hospitalized her and finished her ERAVACYCLINE. We treated her with azole for her intertrigo and she is on Xarelto for her DVT.  Wound continues to heal up.  Past Medical History:  Diagnosis Date  . Bladder stones    Hx of  . Depression   . GERD (gastroesophageal reflux disease)   . Intertrigo 04/24/2017  . Left upper extremity swelling 04/24/2017  . Multiple sclerosis ([place])   . Paraplegia ([place])   . PTSD (post-traumatic stress disorder)   . Transverse myelitis [facility])     Past Surgical History:  Procedure Laterality Date  . CHOLECYSTECTOMY    . CYSTECTOMY W/ URETEROILEAL CONDUIT    . IR FLUORO GUIDE CV LINE LEFT  02/25/2017  . IR US GUIDE VASC ACCESS LEFT  02/25/2017  . IRRIGATION AND DEBRIDEMENT ABSCESS N/A 02/18/2017   Procedure: IRRIGATION AND DEBRIDEMENT ABSCESS;  Surgeon: Excell Seltzer, MD;  Location: [facility];  Service: General;  Laterality: N/A;  . TONSILLECTOMY    . WISDOM TOOTH EXTRACTION      Family History  Problem Relation Age of Onset  . Diabetes type II Father   . CAD Father   . Dementia Father   . Stroke Father   . Seizures Father   . Diabetes type II Mother   . Hypertension Mother   . Breast cancer Other    . Diabetes type II Other       Social History   Socioeconomic History  . Marital status: Single    Spouse name: Not on file  . Number of children: Not on file  . Years of education: Not on file  . Highest education level: Not on file  Social Needs  . Financial resource strain: Not on file  . Food insecurity - worry: Not on file  . Food insecurity - inability: Not on file  . Transportation needs - medical: Not on file  . Transportation needs - non-medical: Not on file  Occupational History  . Not on file  Tobacco Use  . Smoking status: Never Smoker  . Smokeless tobacco: Never Used  Substance and Sexual Activity  . Alcohol use: No  . Drug use: No  . Sexual activity: No  Other Topics Concern  . Not on file  Social History Narrative   Lives with parents.  All disabled.  Attended [employer] until she got transverse myelitis.    Allergies  Allergen Reactions  . Iodine Anaphylaxis  . Latex Anaphylaxis  . Polymyxin B Hives    Pt had hives in right arm with infusion of polymyxin on November 26th, 2018  . Shellfish-Derived Products Anaphylaxis    Reaction unknown  . Vancomycin Anaphylaxis  . Aspirin Other (See Comments)  Reaction unknown  . Strawberry Extract Hives and Rash     Current Outpatient Medications:  .  acetaminophen (TYLENOL) 325 MG tablet, Take 650 mg by mouth every 6 (six) hours as needed for fever., Disp: , Rfl:  .  baclofen (LIORESAL) 20 MG tablet, Take 20 mg by mouth 3 (three) times daily., Disp: , Rfl:  .  bisacodyl (DULCOLAX) 10 MG suppository, Place 1 suppository (10 mg total) daily rectally. (Patient taking differently: Place 10 mg rectally daily as needed for mild constipation. ), Disp: 12 suppository, Rfl: 0 .  diazepam (VALIUM) 5 MG tablet, Take 1-2 tablets (5-10 mg total) by mouth 3 (three) times daily. Take 1 tablet in AM, 1 tablet in PM, and 2 tablets at bedtime, Disp: 10 tablet, Rfl: 0 .  diphenhydrAMINE (BENADRYL) 25 mg capsule, Take 1 capsule (25  mg total) by mouth every 8 (eight) hours as needed for itching. (Patient taking differently: Take 50 mg by mouth every 12 (twelve) hours. ), Disp: 30 capsule, Rfl: 0 .  ferrous sulfate 325 (65 FE) MG tablet, Take 1 tablet (325 mg total) by mouth daily with breakfast., Disp: 30 tablet, Rfl: 3 .  fluticasone (FLONASE) 50 MCG/ACT nasal spray, Place 2 sprays into the nose daily as needed for allergies. , Disp: , Rfl:  .  gabapentin (NEURONTIN) 400 MG capsule, Take 2 capsules (800 mg total) by mouth 3 (three) times daily., Disp: 180 capsule, Rfl: 8 .  ibuprofen (ADVIL,MOTRIN) 200 MG tablet, Take 200-400 mg by mouth every 6 (six) hours as needed for headache or moderate pain. , Disp: , Rfl:  .  lamoTRIgine (LAMICTAL) 25 MG tablet, Take 2 tablets (50 mg total) by mouth daily., Disp: 60 tablet, Rfl: 0 .  loperamide (IMODIUM) 2 MG capsule, Take 2 capsules (4 mg total) by mouth 3 (three) times daily as needed for diarrhea or loose stools., Disp: 30 capsule, Rfl: 0 .  loratadine (CLARITIN) 5 MG/5ML syrup, Take 5 mg daily as needed by mouth for allergies. , Disp: , Rfl:  .  nystatin cream (MYCOSTATIN), Apply 1 application topically 3 (three) times daily., Disp: 30 g, Rfl: 2 .  omeprazole (PRILOSEC) 40 MG capsule, Take 40 mg by mouth daily., Disp: , Rfl:  .  ondansetron (ZOFRAN-ODT) 4 MG disintegrating tablet, Take 1 tablet (4 mg total) by mouth every 8 (eight) hours as needed for nausea or vomiting. (Patient taking differently: Take 4 mg by mouth every 12 (twelve) hours. ), Disp: 20 tablet, Rfl: 0 .  Rivaroxaban 15 & 20 MG TBPK, Take as directed on package: Start with one 15mg  tablet by mouth twice a day with food. On 05/16/2017, switch to one 20mg  tablet once a day with food., Disp: 51 each, Rfl: 0 .  sertraline (ZOLOFT) 100 MG tablet, Take 1 tablet (100 mg total) by mouth 2 (two) times daily., Disp: 60 tablet, Rfl: 0   Review of Systems  Constitutional: Negative for activity change, appetite change,  diaphoresis, fever and unexpected weight change.  HENT: Negative for congestion, dental problem, facial swelling and hearing loss.   Respiratory: Negative for apnea, cough, shortness of breath, wheezing and stridor.   Cardiovascular: Negative for leg swelling.  Gastrointestinal: Negative for diarrhea and nausea.  Skin: Positive for wound. Negative for rash.  Neurological: Negative for seizures and syncope.  Psychiatric/Behavioral: Negative for agitation.       Objective:   Physical Exam  Constitutional: She is oriented to person, place, and time. She appears well-developed and well-nourished.  HENT:  Head: Normocephalic.  Mouth/Throat: Oropharynx is clear and moist. Dental caries present. No oropharyngeal exudate.  Cardiovascular: Normal rate and regular rhythm.  Pulmonary/Chest: Effort normal.  Abdominal: Soft.  Neurological: She is alert and oriented to person, place, and time.  Psychiatric: She has a normal mood and affect. Her behavior is normal. Judgment and thought content normal.   quadraplegia  Wound 04/24/17: granulation tissue but  Also can palpate bone underneath. It is not visible thoguh which is  Good and there is no clear purulence     05/27/17:          Assessment & Plan:   37 y.o. 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   Given proxmiity to bone there will continue to be risk of this becoming infected yet again.  She will continue with wound care at SNF  She may benefit from being seen by Plastic Surgery (Dr. Marla Roe)   Intertrigo: resolved  DVT: on xarelto

## 2017-07-24 ENCOUNTER — Ambulatory Visit (INDEPENDENT_AMBULATORY_CARE_PROVIDER_SITE_OTHER): Payer: Medicaid Other | Admitting: Infectious Disease

## 2017-07-24 ENCOUNTER — Telehealth: Payer: Self-pay | Admitting: *Deleted

## 2017-07-24 ENCOUNTER — Encounter: Payer: Self-pay | Admitting: Infectious Disease

## 2017-07-24 VITALS — BP 93/59 | HR 88 | Temp 97.9°F

## 2017-07-24 DIAGNOSIS — Z1624 Resistance to multiple antibiotics: Secondary | ICD-10-CM | POA: Diagnosis not present

## 2017-07-24 DIAGNOSIS — A499 Bacterial infection, unspecified: Secondary | ICD-10-CM | POA: Diagnosis not present

## 2017-07-24 DIAGNOSIS — L89323 Pressure ulcer of left buttock, stage 3: Secondary | ICD-10-CM

## 2017-07-24 DIAGNOSIS — M869 Osteomyelitis, unspecified: Secondary | ICD-10-CM

## 2017-07-24 DIAGNOSIS — A498 Other bacterial infections of unspecified site: Secondary | ICD-10-CM

## 2017-07-24 LAB — BASIC METABOLIC PANEL WITH GFR
BUN/Creatinine Ratio: 58 (calc) — ABNORMAL HIGH (ref 6–22)
BUN: 21 mg/dL (ref 7–25)
CO2: 29 mmol/L (ref 20–32)
Calcium: 9.5 mg/dL (ref 8.6–10.2)
Chloride: 104 mmol/L (ref 98–110)
Creat: 0.36 mg/dL — ABNORMAL LOW (ref 0.50–1.10)
GFR, EST NON AFRICAN AMERICAN: 139 mL/min/{1.73_m2} (ref >=60)
GFR, Est African American: 161 mL/min/{1.73_m2} (ref >=60)
Glucose, Bld: 94 mg/dL (ref 65–99)
POTASSIUM: 4.4 mmol/L (ref 3.5–5.3)
Sodium: 140 mmol/L (ref 135–146)

## 2017-07-24 LAB — CBC WITH DIFFERENTIAL/PLATELET
BASOS ABS: 7 {cells}/uL (ref 0–200)
Basophils Relative: 0.1 %
EOS ABS: 109 {cells}/uL (ref 15–500)
Eosinophils Relative: 1.6 %
HCT: 37 % (ref 35.0–45.0)
HEMOGLOBIN: 11.3 g/dL — AB (ref 11.7–15.5)
Lymphs Abs: 1523 cells/uL (ref 850–3900)
MCH: 22 pg — AB (ref 27.0–33.0)
MCHC: 30.5 g/dL — AB (ref 32.0–36.0)
MCV: 72.1 fL — ABNORMAL LOW (ref 80.0–100.0)
MONOS PCT: 5.7 %
MPV: 10.3 fL (ref 7.5–12.5)
Neutro Abs: 4774 cells/uL (ref 1500–7800)
Neutrophils Relative %: 70.2 %
Platelets: 262 10*3/uL (ref 140–400)
RBC: 5.13 10*6/uL — ABNORMAL HIGH (ref 3.80–5.10)
RDW: 15.4 % — ABNORMAL HIGH (ref 11.0–15.0)
Total Lymphocyte: 22.4 %
WBC mixed population: 388 cells/uL (ref 200–950)
WBC: 6.8 10*3/uL (ref 3.8–10.8)

## 2017-07-24 LAB — SEDIMENTATION RATE: Sed Rate: 2 mm/h (ref 0–20)

## 2017-07-24 NOTE — Progress Notes (Signed)
Subjective:   Cc: no new complaints followup for pelvic osteomyelitis   Patient ID: [name], female    DOB: February 12, 1981, 37 y.o.   MRN: [id number]  HPI  37 y.o. 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 I switched her to Eravacycline and levaquin.  She is nearly done with 4 weeks of therarpy with these drugs and per WOC at facility having improvement in appearance of granulation tissue.   At last visit she was suffering from intertrigo that has not resolved. She also was still having liquid stools that were soiling wound at times. She was found to have DVT and we hospitalized her and finished her ERAVACYCLINE. We treated her with azole for her intertrigo and she is on Xarelto for her DVT.  My last saw her her wound was continuing to look improved.  Today we examined it and there was some more purulence in 1 of the wounds as documented below.  Past Medical History:  Diagnosis Date  . Bladder stones    Hx of  . Depression   . GERD (gastroesophageal reflux disease)   . Intertrigo 04/24/2017  . Left upper extremity swelling 04/24/2017  . Multiple sclerosis ([place])   . Paraplegia ([place])   . PTSD (post-traumatic stress disorder)   . Transverse myelitis [facility])     Past Surgical History:  Procedure Laterality Date  . CHOLECYSTECTOMY    . CYSTECTOMY W/ URETEROILEAL CONDUIT    . IR FLUORO GUIDE CV LINE LEFT  02/25/2017  . IR US GUIDE VASC ACCESS LEFT  02/25/2017  . IRRIGATION AND DEBRIDEMENT ABSCESS N/A 02/18/2017   Procedure: IRRIGATION AND DEBRIDEMENT ABSCESS;  Surgeon: Excell Seltzer, MD;  Location: [facility];  Service: General;  Laterality: N/A;  . TONSILLECTOMY    . WISDOM TOOTH EXTRACTION      Family History  Problem Relation Age of Onset  . Diabetes type II Father   . CAD Father   .  Dementia Father   . Stroke Father   . Seizures Father   . Diabetes type II Mother   . Hypertension Mother   . Breast cancer Other   . Diabetes type II Other       Social History   Socioeconomic History  . Marital status: Single    Spouse name: Not on file  . Number of children: Not on file  . Years of education: Not on file  . Highest education level: Not on file  Occupational History  . Not on file  Social Needs  . Financial resource strain: Not on file  . Food insecurity:    Worry: Not on file    Inability: Not on file  . Transportation needs:    Medical: Not on file    Non-medical: Not on file  Tobacco Use  . Smoking status: Never Smoker  . Smokeless tobacco: Never Used  Substance and Sexual Activity  . Alcohol use: No  . Drug use: No  . Sexual activity: Never  Lifestyle  . Physical activity:    Days per week: Not on file    Minutes per session: Not on file  . Stress: Not on file  Relationships  . Social connections:    Talks on phone: Not on file    Gets together: Not on file    Attends religious service: Not on file  Active member of club or organization: Not on file    Attends meetings of clubs or organizations: Not on file    Relationship status: Not on file  Other Topics Concern  . Not on file  Social History Narrative   Lives with parents.  All disabled.  Attended [employer] until she got transverse myelitis.    Allergies  Allergen Reactions  . Iodine Anaphylaxis  . Latex Anaphylaxis  . Polymyxin B Hives    Pt had hives in right arm with infusion of polymyxin on November 26th, 2018  . Shellfish-Derived Products Anaphylaxis    Reaction unknown  . Vancomycin Anaphylaxis  . Aspirin Other (See Comments)    Reaction unknown  . Strawberry Extract Hives and Rash     Current Outpatient Medications:  .  acetaminophen (TYLENOL) 325 MG tablet, Take 650 mg by mouth every 6 (six) hours as needed for fever., Disp: , Rfl:  .  baclofen (LIORESAL) 20 MG  tablet, Take 20 mg by mouth 3 (three) times daily., Disp: , Rfl:  .  bisacodyl (DULCOLAX) 10 MG suppository, Place 1 suppository (10 mg total) daily rectally. (Patient taking differently: Place 10 mg rectally daily as needed for mild constipation. ), Disp: 12 suppository, Rfl: 0 .  diazepam (VALIUM) 5 MG tablet, Take 1-2 tablets (5-10 mg total) by mouth 3 (three) times daily. Take 1 tablet in AM, 1 tablet in PM, and 2 tablets at bedtime, Disp: 10 tablet, Rfl: 0 .  diphenhydrAMINE (BENADRYL) 25 mg capsule, Take 1 capsule (25 mg total) by mouth every 8 (eight) hours as needed for itching. (Patient taking differently: Take 50 mg by mouth every 12 (twelve) hours. ), Disp: 30 capsule, Rfl: 0 .  ferrous sulfate 325 (65 FE) MG tablet, Take 1 tablet (325 mg total) by mouth daily with breakfast., Disp: 30 tablet, Rfl: 3 .  fluticasone (FLONASE) 50 MCG/ACT nasal spray, Place 2 sprays into the nose daily as needed for allergies. , Disp: , Rfl:  .  gabapentin (NEURONTIN) 400 MG capsule, Take 2 capsules (800 mg total) by mouth 3 (three) times daily., Disp: 180 capsule, Rfl: 8 .  ibuprofen (ADVIL,MOTRIN) 200 MG tablet, Take 200-400 mg by mouth every 6 (six) hours as needed for headache or moderate pain. , Disp: , Rfl:  .  lamoTRIgine (LAMICTAL) 25 MG tablet, Take 2 tablets (50 mg total) by mouth daily., Disp: 60 tablet, Rfl: 0 .  loperamide (IMODIUM) 2 MG capsule, Take 2 capsules (4 mg total) by mouth 3 (three) times daily as needed for diarrhea or loose stools., Disp: 30 capsule, Rfl: 0 .  loratadine (CLARITIN) 5 MG/5ML syrup, Take 5 mg daily as needed by mouth for allergies. , Disp: , Rfl:  .  nystatin cream (MYCOSTATIN), Apply 1 application topically 3 (three) times daily., Disp: 30 g, Rfl: 2 .  omeprazole (PRILOSEC) 40 MG capsule, Take 40 mg by mouth daily., Disp: , Rfl:  .  ondansetron (ZOFRAN-ODT) 4 MG disintegrating tablet, Take 1 tablet (4 mg total) by mouth every 8 (eight) hours as needed for nausea or  vomiting. (Patient taking differently: Take 4 mg by mouth every 12 (twelve) hours. ), Disp: 20 tablet, Rfl: 0 .  Rivaroxaban 15 & 20 MG TBPK, Take as directed on package: Start with one 15mg  tablet by mouth twice a day with food. On 05/16/2017, switch to one 20mg  tablet once a day with food., Disp: 51 each, Rfl: 0 .  sertraline (ZOLOFT) 100 MG tablet, Take 1 tablet (  100 mg total) by mouth 2 (two) times daily., Disp: 60 tablet, Rfl: 0   Review of Systems  Constitutional: Negative for activity change, appetite change, diaphoresis, fever and unexpected weight change.  HENT: Negative for congestion, dental problem, facial swelling and hearing loss.   Respiratory: Negative for apnea, cough, shortness of breath, wheezing and stridor.   Cardiovascular: Negative for leg swelling.  Gastrointestinal: Negative for diarrhea and nausea.  Skin: Positive for wound. Negative for rash.  Neurological: Negative for seizures and syncope.  Psychiatric/Behavioral: Negative for agitation.       Objective:   Physical Exam  Constitutional: She is oriented to person, place, and time. She appears well-developed and well-nourished.  HENT:  Head: Normocephalic.  Mouth/Throat: Oropharynx is clear and moist. Dental caries present. No oropharyngeal exudate.  Cardiovascular: Normal rate and regular rhythm.  Pulmonary/Chest: Effort normal.  Abdominal: Soft.  Neurological: She is alert and oriented to person, place, and time.  Psychiatric: She has a normal mood and affect. Her behavior is normal. Judgment and thought content normal.   quadraplegia  Wound 04/24/17: granulation tissue but  Also can palpate bone underneath. It is not visible thoguh which is  Good and there is no clear purulence     05/27/17:     07/24/2017: Some small amount of purulence expressible from the more medial aspect of her wound as documented below.  She claims that this has to do with some type of material that is being applied to the  wound but given her history of some anxiety that this could represent evolving infection.               Assessment & Plan:   37 y.o. 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  I am referring her to plastic surgery with Dr. Marla Roe I am also checking inflammatory markers.  If the latter are up I will get another MRI   Intertrigo: resolved  DVT: on xarelto  I spent greater than 25 minutes with the patient including greater than 50% of time in face to face counsel of the patient and the nature of these types of infections and need for her to be closely monitored by wound care and how I would like her seen by plastic surgery and next steps were taken evaluating her wound and in coordination of her care.

## 2017-07-24 NOTE — Telephone Encounter (Signed)
Per Dr Tommy Medal called Dr Marla Roe office to schedule an appointment for plastics/wound care. Advised to fax information for the provider to review and once that is done they will call to schedule. Information faxed and will wait for the call to schedule.  Best number to contact the patient is cell number.  Office [phone] Fax [phone]

## 2017-07-25 LAB — C-REACTIVE PROTEIN: CRP: 3.2 mg/L (ref <8.0)

## 2017-07-29 ENCOUNTER — Encounter: Payer: Self-pay | Admitting: Neurology

## 2017-07-29 ENCOUNTER — Ambulatory Visit (INDEPENDENT_AMBULATORY_CARE_PROVIDER_SITE_OTHER): Payer: Medicaid Other | Admitting: Neurology

## 2017-07-29 ENCOUNTER — Ambulatory Visit: Payer: Medicaid Other | Admitting: Infectious Disease

## 2017-07-29 ENCOUNTER — Telehealth: Payer: Self-pay | Admitting: Behavioral Health

## 2017-07-29 VITALS — BP 82/50 | HR 86 | Resp 17 | Ht 63.0 in

## 2017-07-29 DIAGNOSIS — G8220 Paraplegia, unspecified: Secondary | ICD-10-CM

## 2017-07-29 DIAGNOSIS — G373 Acute transverse myelitis in demyelinating disease of central nervous system: Secondary | ICD-10-CM

## 2017-07-29 NOTE — Progress Notes (Signed)
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     UPDATE: Medications: gabapentin 800mg  three times daily; diazepam 5mg  twice daily and baclofen 20mg  three times daily; baclofen 30mg  three times daily.    She was hospitalized several times since last visit.  In November, she had an infected decubitus ulcer and osteomyelitis followed by septic shock in December.  In January, she developed a DVT and is on Xarelto.  She currently at a SNF.  Overall, pain is controlled.  She has some spasms throughout the day.  HISTORY: In 2005, she developed acute transverse myelitis.  She woke up with severe neck and back pain with weakness in the arms and legs, as well as bowel and bladder retention.  MRI of cervical spine showed abnormal signal abnormality and cord swelling from C4 through C7 with abnormal enhancement at the C4-5 level.  MRI of thoracic spine showed upper thoracic cord involvement at TI and T2.  She underwent lumbar puncture and was treated with 3 days of IV Solu-Medrol, followed by oral steroids.     She is paraplegic, unable to move below the waist.  She has both bowel and urinary retention.  She has right greater than left upper extremity weakness.   She has a urostomy pouch and requires catheterizations.  She uses stool softeners for constipation.  She takes Diazepam and Baclofen for muscle spasms, as well as gabapentin.  She has chronic sacral decubitus ulcer for which she is followed by wound care.  She reports daytime somnolence.  She sees psychiatry for depression and PTSD.   A follow up MRI of the neuroaxis with and without contrast was performed on 03/24/15, which was personally reviewed.  The brain showed slight cerebral volume loss but no demyelinating lesions.  The cervical and thoracic spinal cord showed atrophy  extending from C6 to T2, greatest at C7-T1.  There is a left central disc extrusion at C5-6 resulting in cord flattening with new T2 signal at this level but no abnormal enhancement.  PAST MEDICAL HISTORY: Past Medical History:  Diagnosis Date  . Bladder stones    Hx of  . Depression   . GERD (gastroesophageal reflux disease)   . Intertrigo 04/24/2017  . Left upper extremity swelling 04/24/2017  . Multiple sclerosis ([place])   . Paraplegia ([place])   . PTSD (post-traumatic stress disorder)   . Transverse myelitis ([place])     MEDICATIONS: Current Outpatient Medications on File Prior to Visit  Medication Sig Dispense Refill  . acetaminophen (TYLENOL) 325 MG tablet Take 650 mg by mouth every 6 (six) hours as needed for fever.    . baclofen (LIORESAL) 20 MG tablet Take 20 mg by mouth 3 (three) times daily.    . bisacodyl (DULCOLAX) 10 MG suppository Place 1 suppository (10 mg total) daily rectally. (Patient taking differently: Place 10 mg rectally daily as needed for mild constipation. ) 12 suppository 0  . diazepam (VALIUM) 5 MG tablet Take 1-2 tablets (5-10 mg total) by mouth 3 (three) times daily. Take 1 tablet in AM, 1 tablet in PM, and 2 tablets at bedtime 10 tablet 0  . diphenhydrAMINE (BENADRYL) 25 mg capsule Take 1 capsule (25 mg total) by mouth every 8 (eight) hours as needed for itching. (Patient taking differently: Take 50 mg by mouth every 12 (twelve) hours. ) 30 capsule  0  . ferrous sulfate 325 (65 FE) MG tablet Take 1 tablet (325 mg total) by mouth daily with breakfast. 30 tablet 3  . fluticasone (FLONASE) 50 MCG/ACT nasal spray Place 2 sprays into the nose daily as needed for allergies.     Marland Kitchen gabapentin (NEURONTIN) 400 MG capsule Take 2 capsules (800 mg total) by mouth 3 (three) times daily. 180 capsule 8  . ibuprofen (ADVIL,MOTRIN) 200 MG tablet Take 200-400 mg by mouth every 6 (six) hours as needed for headache or moderate pain.     Marland Kitchen lamoTRIgine (LAMICTAL) 25 MG tablet Take 2 tablets  (50 mg total) by mouth daily. 60 tablet 0  . loperamide (IMODIUM) 2 MG capsule Take 2 capsules (4 mg total) by mouth 3 (three) times daily as needed for diarrhea or loose stools. 30 capsule 0  . loratadine (CLARITIN) 5 MG/5ML syrup Take 5 mg daily as needed by mouth for allergies.     Marland Kitchen nystatin cream (MYCOSTATIN) Apply 1 application topically 3 (three) times daily. 30 g 2  . omeprazole (PRILOSEC) 40 MG capsule Take 40 mg by mouth daily.    . ondansetron (ZOFRAN-ODT) 4 MG disintegrating tablet Take 1 tablet (4 mg total) by mouth every 8 (eight) hours as needed for nausea or vomiting. (Patient taking differently: Take 4 mg by mouth every 12 (twelve) hours. ) 20 tablet 0  . Rivaroxaban 15 & 20 MG TBPK Take as directed on package: Start with one 15mg  tablet by mouth twice a day with food. On 05/16/2017, switch to one 20mg  tablet once a day with food. 51 each 0  . sertraline (ZOLOFT) 100 MG tablet Take 1 tablet (100 mg total) by mouth 2 (two) times daily. 60 tablet 0   No current facility-administered medications on file prior to visit.     ALLERGIES: Allergies  Allergen Reactions  . Iodine Anaphylaxis  . Latex Anaphylaxis  . Polymyxin B Hives    Pt had hives in right arm with infusion of polymyxin on November 26th, 2018  . Shellfish-Derived Products Anaphylaxis    Reaction unknown  . Vancomycin Anaphylaxis  . Aspirin Other (See Comments)    Reaction unknown  . Strawberry Extract Hives and Rash    FAMILY HISTORY: Family History  Problem Relation Age of Onset  . Diabetes type II Father   . CAD Father   . Dementia Father   . Stroke Father   . Seizures Father   . Diabetes type II Mother   . Hypertension Mother   . Breast cancer Other   . Diabetes type II Other     SOCIAL HISTORY: Social History   Socioeconomic History  . Marital status: Single    Spouse name: Not on file  . Number of children: Not on file  . Years of education: Not on file  . Highest education level: Not on  file  Occupational History  . Not on file  Social Needs  . Financial resource strain: Not on file  . Food insecurity:    Worry: Not on file    Inability: Not on file  . Transportation needs:    Medical: Not on file    Non-medical: Not on file  Tobacco Use  . Smoking status: Never Smoker  . Smokeless tobacco: Never Used  Substance and Sexual Activity  . Alcohol use: No  . Drug use: No  . Sexual activity: Never  Lifestyle  . Physical activity:    Days per week: Not on file  Minutes per session: Not on file  . Stress: Not on file  Relationships  . Social connections:    Talks on phone: Not on file    Gets together: Not on file    Attends religious service: Not on file    Active member of club or organization: Not on file    Attends meetings of clubs or organizations: Not on file    Relationship status: Not on file  . Intimate partner violence:    Fear of current or ex partner: Not on file    Emotionally abused: Not on file    Physically abused: Not on file    Forced sexual activity: Not on file  Other Topics Concern  . Not on file  Social History Narrative   Lives with parents.  All disabled.  Attended [employer] until she got transverse myelitis.    REVIEW OF SYSTEMS: Constitutional: No fevers, chills, or sweats, no generalized fatigue, change in appetite Eyes: No visual changes, double vision, eye pain Ear, nose and throat: No hearing loss, ear pain, nasal congestion, sore throat Cardiovascular: No chest pain, palpitations Respiratory:  No shortness of breath at rest or with exertion, wheezes Genitourinary:  Neurogenic bladder Musculoskeletal:  Muscle spasms Integumentary: decubital ulcer Neurological: as above Psychiatric: depression Endocrine: No palpitations, fatigue, diaphoresis, mood swings, change in appetite, change in weight, increased thirst Hematologic/Lymphatic:  No purpura, petechiae. Allergic/Immunologic: no itchy/runny eyes, nasal congestion, recent  allergic reactions, rashes  PHYSICAL EXAM: Vitals:   07/29/17 1041  BP: (!) 82/50  Pulse: 86  Resp: 17   General: No acute distress.   Head:  Normocephalic/atraumatic Eyes:  Fundi examined but not visualized Neck: supple, no paraspinal tenderness, full range of motion Heart:  Regular rate and rhythm Lungs:  Clear to auscultation bilaterally Back: No paraspinal tenderness Neurological Exam: alert and oriented to person, place, and time. Attention span and concentration intact, recent and remote memory intact, fund of knowledge intact.  Speech fluent and not dysarthric, language intact.  CN II-XII intact. Decreased muscle bulk in left upper extremity and both lower extremities.  Increased tone in left upper and both lower extremities. muscle strength 5/5 right deltoid, both biceps, both triceps, both wrist and finger flexors and extensors.  4/5 left deltoid, left hand grip.  0/5 lower extremities..  Sensation to light touch reduced in lower extremities.  Deep tendon reflexes 3+ throughout, slightly more brisk on left, toes downgoing.  Finger to nose testing intact.  Non-ambulatory  IMPRESSION: Spastic diplegia of lower extremities History of transverse myelitis  PLAN: Continue supportive management of pain and muscle spasms with gabapentin, diazepam and baclofen  Follow up in 9 months.  16 minutes spent face to face with patient, over 50% spent discussing management.  Metta Clines, DO  CC:  Lennie Odor, PA-C

## 2017-07-29 NOTE — Telephone Encounter (Signed)
[name] she def needs to be seen by plastics

## 2017-07-29 NOTE — Telephone Encounter (Signed)
Sharyn Lull from Dr. Eusebio Friendly office called stating patient called their office to cancel her May 17th appointment that Dr. Tommy Medal put in a referral for.  Per Dr. Derek Mound last office visit note there was a plastics referral placed and patient needs to go to this visit.  Sharyn Lull added her back to Dr. Eusebio Friendly schedule for 08/16/2017. Will call patient's guardian to find out further information. [name] RN

## 2017-07-29 NOTE — Telephone Encounter (Addendum)
Called patient's mother and she states [name] is refusing to have surgery because she was told she is unable to have surgery or it may kill her.  Patient is going to see Dr. Tomi Likens today who is her Neurologist to ask further questions. Per mother, patient is feeling forced to have surgery and would not like to pursue at this time. [name] RN

## 2017-07-30 NOTE — Telephone Encounter (Signed)
I am NOT forcing her to have surgery. I would like her to be seen by plastics for their expert WOUND CARE knowledge and potential discussions for surgery is not likely.

## 2017-07-30 NOTE — Telephone Encounter (Signed)
Called [name], Spoke with her, informed her per Dr. Tommy Medal that he is not forcing surgery upon her and he wanted her to see plastics for their expert wound  Knowledge.  Also let her know potentional discussions for surgery.  [name] still stated she does not want to see plastics and is not going to go to the appointment May 17th.  She states her wound is healed.   [name] RN

## 2017-07-31 NOTE — Telephone Encounter (Signed)
Oh well. Is she willing to go to a wound care clinic?

## 2017-07-31 NOTE — Telephone Encounter (Addendum)
Called [name], asked if she would consider going to wound care?  [name]  states she has seen Dr. Dellia Nims in the past at the [place] [facility] and would agree to see him if Dr. Tommy Medal wants her to but not plastics.   [name] RN

## 2017-07-31 NOTE — Telephone Encounter (Signed)
That is fine with me.

## 2017-08-01 ENCOUNTER — Other Ambulatory Visit: Payer: Self-pay | Admitting: Behavioral Health

## 2017-08-01 DIAGNOSIS — A498 Other bacterial infections of unspecified site: Secondary | ICD-10-CM

## 2017-08-01 DIAGNOSIS — Z1624 Resistance to multiple antibiotics: Secondary | ICD-10-CM

## 2017-08-01 DIAGNOSIS — M869 Osteomyelitis, unspecified: Secondary | ICD-10-CM

## 2017-08-01 DIAGNOSIS — L89323 Pressure ulcer of left buttock, stage 3: Secondary | ICD-10-CM

## 2017-08-01 DIAGNOSIS — A499 Bacterial infection, unspecified: Secondary | ICD-10-CM

## 2017-08-22 ENCOUNTER — Encounter (HOSPITAL_BASED_OUTPATIENT_CLINIC_OR_DEPARTMENT_OTHER): Payer: Medicaid Other | Attending: Internal Medicine

## 2017-08-22 DIAGNOSIS — G35 Multiple sclerosis: Secondary | ICD-10-CM | POA: Diagnosis not present

## 2017-08-22 DIAGNOSIS — G629 Polyneuropathy, unspecified: Secondary | ICD-10-CM | POA: Diagnosis not present

## 2017-08-22 DIAGNOSIS — L89324 Pressure ulcer of left buttock, stage 4: Secondary | ICD-10-CM | POA: Diagnosis present

## 2017-08-22 DIAGNOSIS — G8220 Paraplegia, unspecified: Secondary | ICD-10-CM | POA: Insufficient documentation

## 2017-09-05 ENCOUNTER — Ambulatory Visit: Payer: Medicaid Other | Admitting: Infectious Disease

## 2017-09-26 ENCOUNTER — Encounter: Payer: Self-pay | Admitting: Infectious Disease

## 2017-09-26 ENCOUNTER — Ambulatory Visit (INDEPENDENT_AMBULATORY_CARE_PROVIDER_SITE_OTHER): Payer: Medicaid Other | Admitting: Infectious Disease

## 2017-09-26 VITALS — BP 112/70 | HR 79 | Temp 97.8°F

## 2017-09-26 DIAGNOSIS — L89323 Pressure ulcer of left buttock, stage 3: Secondary | ICD-10-CM | POA: Diagnosis not present

## 2017-09-26 DIAGNOSIS — G8220 Paraplegia, unspecified: Secondary | ICD-10-CM

## 2017-09-26 DIAGNOSIS — A498 Other bacterial infections of unspecified site: Secondary | ICD-10-CM | POA: Diagnosis not present

## 2017-09-26 DIAGNOSIS — A499 Bacterial infection, unspecified: Secondary | ICD-10-CM | POA: Diagnosis present

## 2017-09-26 DIAGNOSIS — G373 Acute transverse myelitis in demyelinating disease of central nervous system: Secondary | ICD-10-CM

## 2017-09-26 DIAGNOSIS — A4902 Methicillin resistant Staphylococcus aureus infection, unspecified site: Secondary | ICD-10-CM

## 2017-09-26 DIAGNOSIS — Z1624 Resistance to multiple antibiotics: Secondary | ICD-10-CM | POA: Diagnosis not present

## 2017-09-26 DIAGNOSIS — G35 Multiple sclerosis: Secondary | ICD-10-CM

## 2017-09-26 DIAGNOSIS — B379 Candidiasis, unspecified: Secondary | ICD-10-CM

## 2017-09-26 DIAGNOSIS — Z23 Encounter for immunization: Secondary | ICD-10-CM | POA: Diagnosis not present

## 2017-09-26 HISTORY — DX: Candidiasis, unspecified: B37.9

## 2017-09-26 MED ORDER — FLUCONAZOLE 100 MG PO TABS: 100.0000 mg | ORAL_TABLET | Freq: Every day | ORAL | 0 refills | Status: DC

## 2017-09-26 NOTE — Progress Notes (Signed)
Subjective:   Cc: no new complaints followup for pelvic osteomyelitis, wounds are improving   Patient ID: [name], female    DOB: [birth year], 37 y.o.   MRN: [id number]  HPI  37 y.o. 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 I switched her to Eravacycline and levaquin.  She is nearly done with 4 weeks of therarpy with these drugs and per WOC at facility having improvement in appearance of granulation tissue.   At last visit she was suffering from intertrigo that has not resolved. She also was still having liquid stools that were soiling wound at times. She was found to have DVT and we hospitalized her and finished her ERAVACYCLINE. We treated her with azole for her intertrigo and she is on Xarelto for her DVT.  My last saw her her wound was continuing to look improved.  However the last time I saw her she had more purulence coming from the wound and I had wanted her to be seen by plastic surgery.  She did not want to do this but continued with local wound care.  Her inflammatory markers were reassuring and I did not pursue imaging at that time.  She now returns for follow-up 2 months later.  She tells me the wound has been improving and indeed on exam it is improved.  She does have some vaginal discharge which I worry may be a yeast infection.  Past Medical History:  Diagnosis Date  . Bladder stones    Hx of  . Depression   . GERD (gastroesophageal reflux disease)   . Intertrigo 04/24/2017  . Left upper extremity swelling 04/24/2017  . Multiple sclerosis ([place])   . Paraplegia ([place])   . PTSD (post-traumatic stress disorder)   . Transverse myelitis ([place])   . Yeast infection 09/26/2017    Past Surgical History:  Procedure Laterality Date  . CHOLECYSTECTOMY    . CYSTECTOMY W/ URETEROILEAL CONDUIT     . IR FLUORO GUIDE CV LINE LEFT  02/25/2017  . IR US GUIDE VASC ACCESS LEFT  02/25/2017  . IRRIGATION AND DEBRIDEMENT ABSCESS N/A 02/18/2017   Procedure: IRRIGATION AND DEBRIDEMENT ABSCESS;  Surgeon: Excell Seltzer, MD;  Location: [facility];  Service: General;  Laterality: N/A;  . TONSILLECTOMY    . WISDOM TOOTH EXTRACTION      Family History  Problem Relation Age of Onset  . Diabetes type II Father   . CAD Father   . Dementia Father   . Stroke Father   . Seizures Father   . Diabetes type II Mother   . Hypertension Mother   . Breast cancer Other   . Diabetes type II Other       Social History   Socioeconomic History  . Marital status: Single    Spouse name: Not on file  . Number of children: Not on file  . Years of education: Not on file  . Highest education level: Not on file  Occupational History  . Not on file  Social Needs  . Financial resource strain: Not on file  . Food insecurity:    Worry: Not on file    Inability: Not on file  . Transportation needs:    Medical: Not on file    Non-medical: Not on file  Tobacco Use  . Smoking status: Never Smoker  . Smokeless tobacco: Never  Used  Substance and Sexual Activity  . Alcohol use: No  . Drug use: No  . Sexual activity: Never  Lifestyle  . Physical activity:    Days per week: Not on file    Minutes per session: Not on file  . Stress: Not on file  Relationships  . Social connections:    Talks on phone: Not on file    Gets together: Not on file    Attends religious service: Not on file    Active member of club or organization: Not on file    Attends meetings of clubs or organizations: Not on file    Relationship status: Not on file  Other Topics Concern  . Not on file  Social History Narrative   Lives with parents.  All disabled.  Attended [employer] until she got transverse myelitis.    Allergies  Allergen Reactions  . Iodine Anaphylaxis  . Latex Anaphylaxis  . Polymyxin B Hives    Pt had hives in  right arm with infusion of polymyxin on November 26th, 2018  . Shellfish-Derived Products Anaphylaxis    Reaction unknown  . Vancomycin Anaphylaxis  . Aspirin Other (See Comments)    Reaction unknown  . Strawberry Extract Hives and Rash     Current Outpatient Medications:  .  acetaminophen (TYLENOL) 325 MG tablet, Take 650 mg by mouth every 6 (six) hours as needed for fever., Disp: , Rfl:  .  baclofen (LIORESAL) 20 MG tablet, Take 20 mg by mouth 3 (three) times daily., Disp: , Rfl:  .  bisacodyl (DULCOLAX) 10 MG suppository, Place 1 suppository (10 mg total) daily rectally. (Patient taking differently: Place 10 mg rectally daily as needed for mild constipation. ), Disp: 12 suppository, Rfl: 0 .  diazepam (VALIUM) 5 MG tablet, Take 1-2 tablets (5-10 mg total) by mouth 3 (three) times daily. Take 1 tablet in AM, 1 tablet in PM, and 2 tablets at bedtime, Disp: 10 tablet, Rfl: 0 .  diphenhydrAMINE (BENADRYL) 25 mg capsule, Take 1 capsule (25 mg total) by mouth every 8 (eight) hours as needed for itching. (Patient taking differently: Take 50 mg by mouth every 12 (twelve) hours. ), Disp: 30 capsule, Rfl: 0 .  ferrous sulfate 325 (65 FE) MG tablet, Take 1 tablet (325 mg total) by mouth daily with breakfast., Disp: 30 tablet, Rfl: 3 .  fluticasone (FLONASE) 50 MCG/ACT nasal spray, Place 2 sprays into the nose daily as needed for allergies. , Disp: , Rfl:  .  gabapentin (NEURONTIN) 400 MG capsule, Take 2 capsules (800 mg total) by mouth 3 (three) times daily., Disp: 180 capsule, Rfl: 8 .  ibuprofen (ADVIL,MOTRIN) 200 MG tablet, Take 200-400 mg by mouth every 6 (six) hours as needed for headache or moderate pain. , Disp: , Rfl:  .  lamoTRIgine (LAMICTAL) 25 MG tablet, Take 2 tablets (50 mg total) by mouth daily., Disp: 60 tablet, Rfl: 0 .  loperamide (IMODIUM) 2 MG capsule, Take 2 capsules (4 mg total) by mouth 3 (three) times daily as needed for diarrhea or loose stools., Disp: 30 capsule, Rfl: 0 .   loratadine (CLARITIN) 5 MG/5ML syrup, Take 5 mg daily as needed by mouth for allergies. , Disp: , Rfl:  .  omeprazole (PRILOSEC) 40 MG capsule, Take 40 mg by mouth daily., Disp: , Rfl:  .  ondansetron (ZOFRAN-ODT) 4 MG disintegrating tablet, Take 1 tablet (4 mg total) by mouth every 8 (eight) hours as needed for nausea or vomiting. (Patient taking  differently: Take 4 mg by mouth every 12 (twelve) hours. ), Disp: 20 tablet, Rfl: 0 .  sertraline (ZOLOFT) 100 MG tablet, Take 1 tablet (100 mg total) by mouth 2 (two) times daily., Disp: 60 tablet, Rfl: 0 .  nystatin cream (MYCOSTATIN), Apply 1 application topically 3 (three) times daily. (Patient not taking: Reported on 09/26/2017), Disp: 30 g, Rfl: 2 .  Rivaroxaban 15 & 20 MG TBPK, Take as directed on package: Start with one 15mg  tablet by mouth twice a day with food. On 05/16/2017, switch to one 20mg  tablet once a day with food. (Patient not taking: Reported on 09/26/2017), Disp: 51 each, Rfl: 0   Review of Systems  Constitutional: Negative for activity change, appetite change, diaphoresis, fever and unexpected weight change.  HENT: Negative for congestion, dental problem, facial swelling and hearing loss.   Respiratory: Negative for apnea, cough, shortness of breath, wheezing and stridor.   Cardiovascular: Negative for leg swelling.  Gastrointestinal: Negative for diarrhea and nausea.  Skin: Positive for wound. Negative for rash.  Neurological: Negative for seizures and syncope.  Psychiatric/Behavioral: Negative for agitation.       Objective:   Physical Exam  Constitutional: She is oriented to person, place, and time. She appears well-developed and well-nourished.  HENT:  Head: Normocephalic.  Mouth/Throat: Oropharynx is clear and moist. Dental caries present. No oropharyngeal exudate.  Cardiovascular: Normal rate and regular rhythm.  Pulmonary/Chest: Effort normal.  Abdominal: Soft.  Neurological: She is alert and oriented to person, place,  and time.  Psychiatric: She has a normal mood and affect. Her behavior is normal. Judgment and thought content normal.   quadraplegia  Wound 04/24/17: granulation tissue but  Also can palpate bone underneath. It is not visible thoguh which is  Good and there is no clear purulence     05/27/17:     07/24/2017: Some small amount of purulence expressible from the more medial aspect of her wound as documented below.  She claims that this has to do with some type of material that is being applied to the wound but given her history of some anxiety that this could represent evolving infection.          Left buttocks wound 09/26/17:    Prior sacral wound 09/26/17:         Assessment & Plan:   37 y.o. 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  I know she is on a pressure relieving mattress but still I think turning it should be part of her care.    vaginal yeast infection: Give a course of fluconazole

## 2017-10-11 IMAGING — CR DG ABDOMEN ACUTE W/ 1V CHEST
3 series · 3 of 3 positions shown · non-contrast
Comparison: CT abdomen pelvis dated 09/27/2014

CLINICAL DATA: Abdominal pain, diarrhea, nausea

EXAM:
DG ABDOMEN ACUTE W/ 1V CHEST

[w abdomen decub]
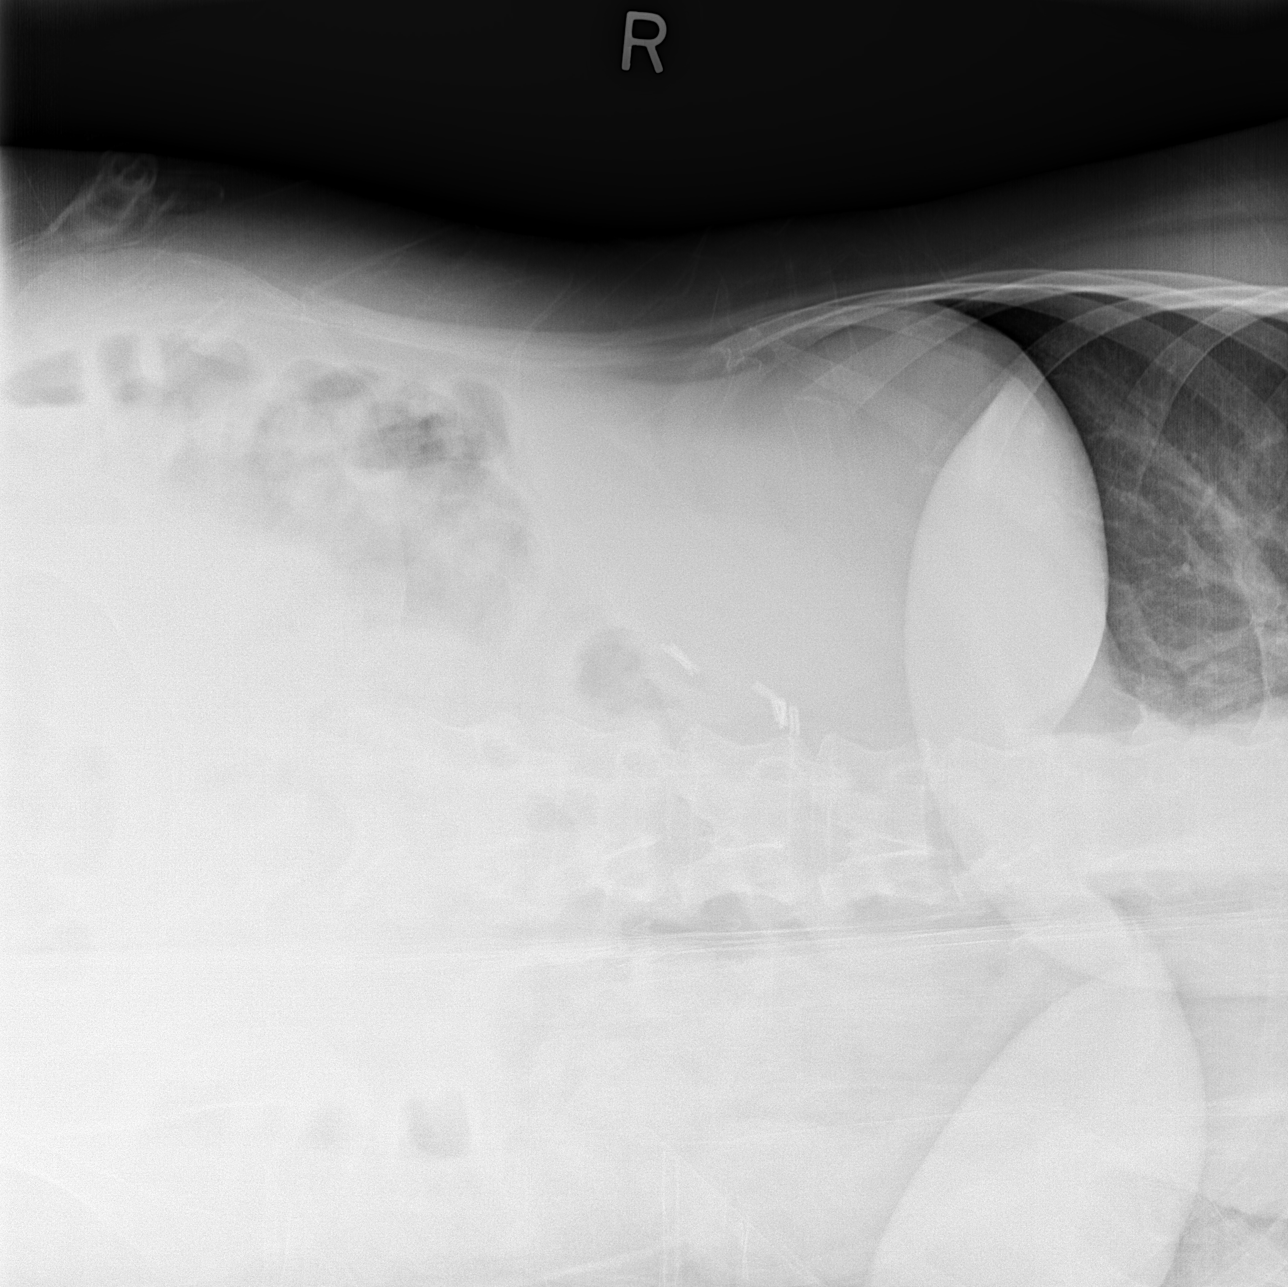

[x abdomen supine]
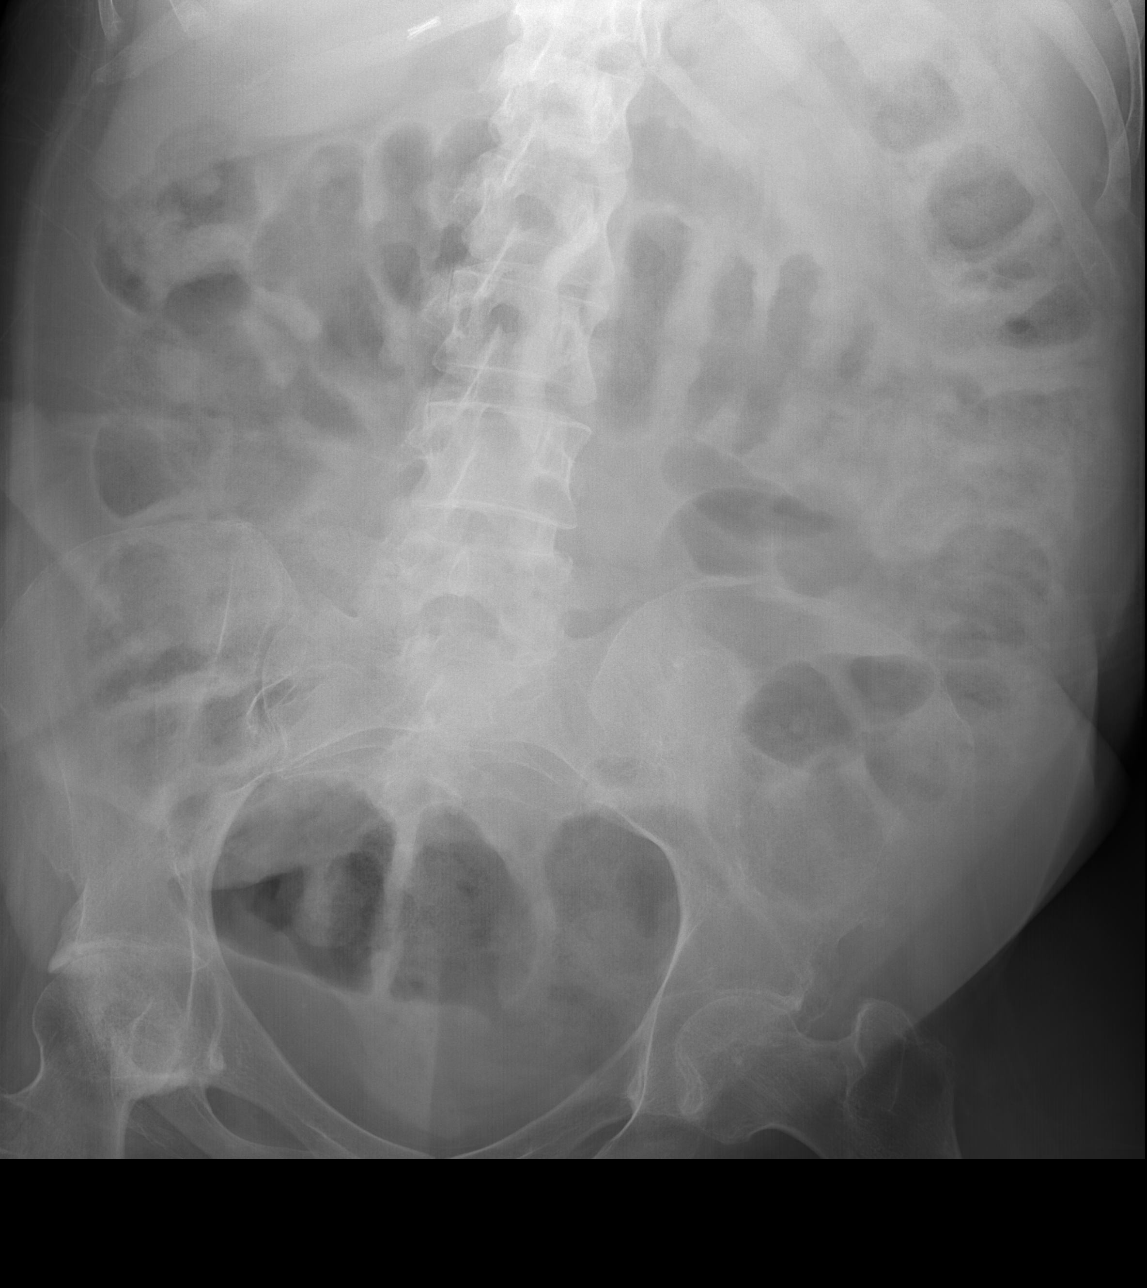

[x chest ap]
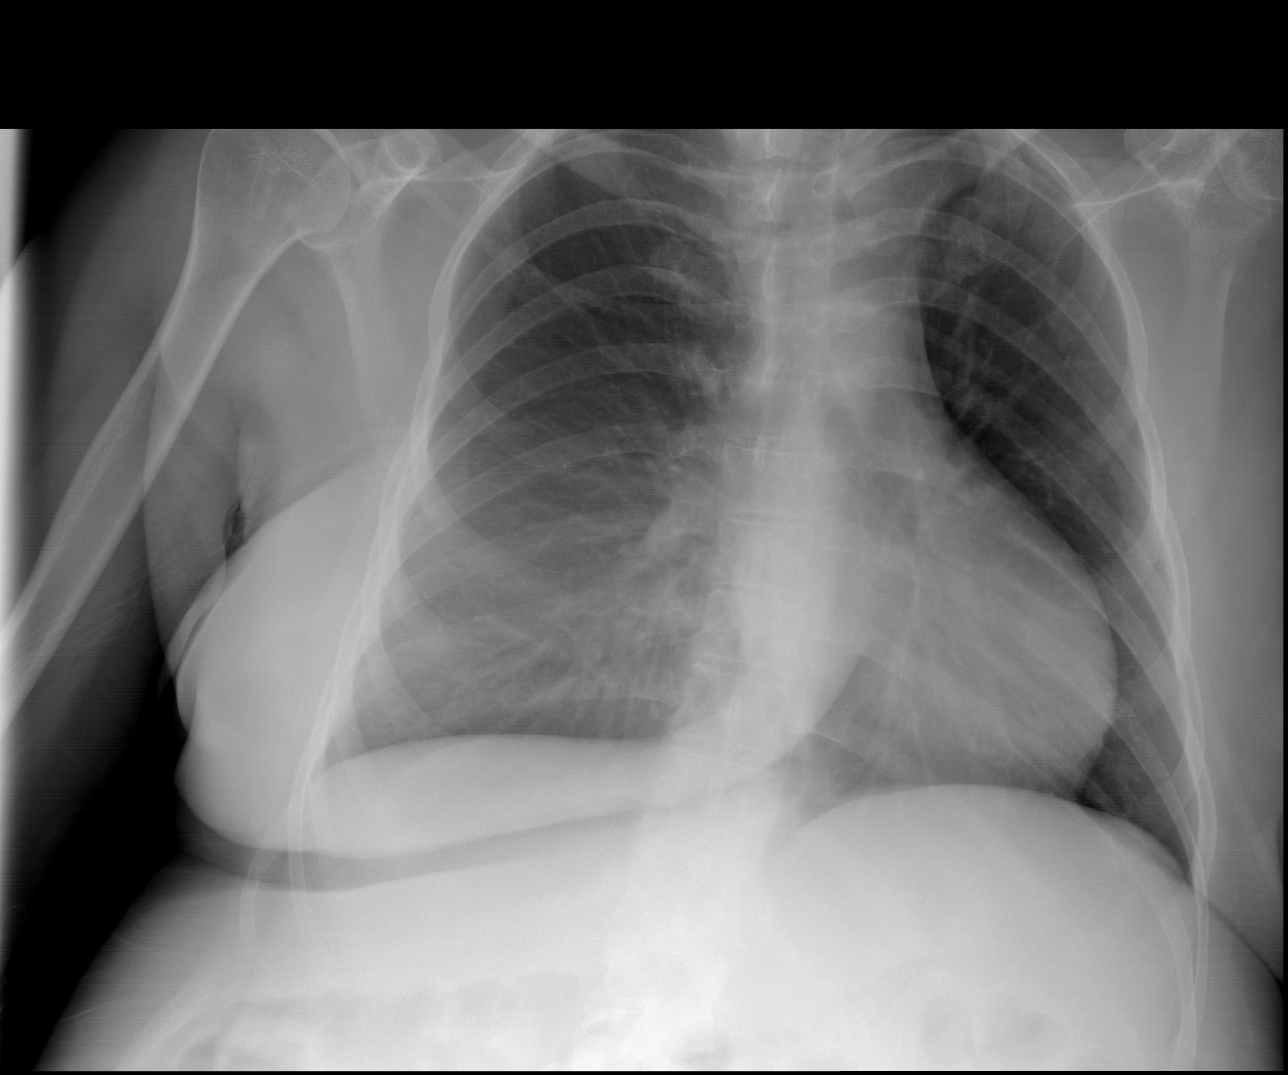

[3 of 3 positions shown; findings below may reference images not displayed]

FINDINGS: Lungs are clear.  No pleural effusion or pneumothorax.

Mild cardiomegaly.

Nonobstructive bowel gas pattern.

No evidence of free air on the lateral decubitus view.

Cholecystectomy clips.
IMPRESSION: No evidence of acute cardiopulmonary disease.

No evidence of small bowel obstruction or free air.

## 2017-12-31 ENCOUNTER — Ambulatory Visit: Payer: Medicaid Other | Admitting: Infectious Disease

## 2018-01-01 ENCOUNTER — Inpatient Hospital Stay (HOSPITAL_COMMUNITY)
Admission: EM | Admit: 2018-01-01 | Discharge: 2018-01-04 | DRG: 872 | Disposition: A | Discharge status: Skilled Nursing Facility | Payer: Medicaid Other | Attending: Internal Medicine | Admitting: Internal Medicine

## 2018-01-01 ENCOUNTER — Encounter (HOSPITAL_COMMUNITY): Payer: Self-pay

## 2018-01-01 ENCOUNTER — Emergency Department (HOSPITAL_COMMUNITY): Payer: Medicaid Other

## 2018-01-01 ENCOUNTER — Other Ambulatory Visit: Payer: Self-pay

## 2018-01-01 DIAGNOSIS — N3000 Acute cystitis without hematuria: Secondary | ICD-10-CM | POA: Diagnosis not present

## 2018-01-01 DIAGNOSIS — R402363 Coma scale, best motor response, obeys commands, at hospital admission: Secondary | ICD-10-CM | POA: Diagnosis present

## 2018-01-01 DIAGNOSIS — Z9102 Food additives allergy status: Secondary | ICD-10-CM

## 2018-01-01 DIAGNOSIS — Z79899 Other long term (current) drug therapy: Secondary | ICD-10-CM

## 2018-01-01 DIAGNOSIS — Z886 Allergy status to analgesic agent status: Secondary | ICD-10-CM

## 2018-01-01 DIAGNOSIS — A419 Sepsis, unspecified organism: Secondary | ICD-10-CM

## 2018-01-01 DIAGNOSIS — A4159 Other Gram-negative sepsis: Principal | ICD-10-CM | POA: Diagnosis present

## 2018-01-01 DIAGNOSIS — M4628 Osteomyelitis of vertebra, sacral and sacrococcygeal region: Secondary | ICD-10-CM | POA: Diagnosis present

## 2018-01-01 DIAGNOSIS — G8220 Paraplegia, unspecified: Secondary | ICD-10-CM | POA: Diagnosis present

## 2018-01-01 DIAGNOSIS — Z936 Other artificial openings of urinary tract status: Secondary | ICD-10-CM

## 2018-01-01 DIAGNOSIS — F4310 Post-traumatic stress disorder, unspecified: Secondary | ICD-10-CM | POA: Diagnosis present

## 2018-01-01 DIAGNOSIS — R402253 Coma scale, best verbal response, oriented, at hospital admission: Secondary | ICD-10-CM | POA: Diagnosis present

## 2018-01-01 DIAGNOSIS — G35 Multiple sclerosis: Secondary | ICD-10-CM | POA: Diagnosis present

## 2018-01-01 DIAGNOSIS — E861 Hypovolemia: Secondary | ICD-10-CM | POA: Diagnosis present

## 2018-01-01 DIAGNOSIS — K219 Gastro-esophageal reflux disease without esophagitis: Secondary | ICD-10-CM | POA: Diagnosis present

## 2018-01-01 DIAGNOSIS — Z91041 Radiographic dye allergy status: Secondary | ICD-10-CM

## 2018-01-01 DIAGNOSIS — Z906 Acquired absence of other parts of urinary tract: Secondary | ICD-10-CM

## 2018-01-01 DIAGNOSIS — I9589 Other hypotension: Secondary | ICD-10-CM

## 2018-01-01 DIAGNOSIS — F329 Major depressive disorder, single episode, unspecified: Secondary | ICD-10-CM | POA: Diagnosis present

## 2018-01-01 DIAGNOSIS — Z91013 Allergy to seafood: Secondary | ICD-10-CM

## 2018-01-01 DIAGNOSIS — N390 Urinary tract infection, site not specified: Secondary | ICD-10-CM | POA: Diagnosis present

## 2018-01-01 DIAGNOSIS — Z881 Allergy status to other antibiotic agents status: Secondary | ICD-10-CM

## 2018-01-01 DIAGNOSIS — Z91040 Latex allergy status: Secondary | ICD-10-CM

## 2018-01-01 DIAGNOSIS — R402143 Coma scale, eyes open, spontaneous, at hospital admission: Secondary | ICD-10-CM | POA: Diagnosis present

## 2018-01-01 DIAGNOSIS — E876 Hypokalemia: Secondary | ICD-10-CM | POA: Diagnosis present

## 2018-01-01 LAB — URINALYSIS, ROUTINE W REFLEX MICROSCOPIC
BILIRUBIN URINE: NEGATIVE
Glucose, UA: NEGATIVE mg/dL
KETONES UR: NEGATIVE mg/dL
Nitrite: NEGATIVE
Protein, ur: 30 mg/dL — AB
Specific Gravity, Urine: 1.012 (ref 1.005–1.030)
pH: 7 (ref 5.0–8.0)

## 2018-01-01 LAB — COMPREHENSIVE METABOLIC PANEL
ALBUMIN: 3.6 g/dL (ref 3.5–5.0)
ALK PHOS: 71 U/L (ref 38–126)
ALT: 19 U/L (ref 0–44)
ANION GAP: 14 (ref 5–15)
AST: 21 U/L (ref 15–41)
BILIRUBIN TOTAL: 0.7 mg/dL (ref 0.3–1.2)
BUN: 9 mg/dL (ref 6–20)
CALCIUM: 9.2 mg/dL (ref 8.9–10.3)
CO2: 23 mmol/L (ref 22–32)
CREATININE: 0.61 mg/dL (ref 0.44–1.00)
Chloride: 101 mmol/L (ref 98–111)
GFR calc non Af Amer: >60 mL/min (ref >60)
GLUCOSE: 114 mg/dL — AB (ref 70–99)
Potassium: 3.4 mmol/L — ABNORMAL LOW (ref 3.5–5.1)
SODIUM: 138 mmol/L (ref 135–145)
TOTAL PROTEIN: 6.6 g/dL (ref 6.5–8.1)

## 2018-01-01 LAB — CBC WITH DIFFERENTIAL/PLATELET
Basophils Absolute: 0 10*3/uL (ref 0.0–0.1)
Basophils Relative: 0 %
EOS PCT: 1 %
Eosinophils Absolute: 0.1 10*3/uL (ref 0.0–0.7)
HCT: 48 % — ABNORMAL HIGH (ref 36.0–46.0)
Hemoglobin: 15 g/dL (ref 12.0–15.0)
Lymphocytes Relative: 17 %
Lymphs Abs: 1.4 10*3/uL (ref 0.7–4.0)
MCH: 27.1 pg (ref 26.0–34.0)
MCHC: 31.3 g/dL (ref 30.0–36.0)
MCV: 86.6 fL (ref 78.0–100.0)
MONO ABS: 0.5 10*3/uL (ref 0.1–1.0)
MONOS PCT: 6 %
NEUTROS ABS: 6 10*3/uL (ref 1.7–7.7)
Neutrophils Relative %: 76 %
PLATELETS: ADEQUATE 10*3/uL (ref 150–400)
RBC: 5.54 MIL/uL — ABNORMAL HIGH (ref 3.87–5.11)
RDW: 15.3 % (ref 11.5–15.5)
WBC: 8 10*3/uL (ref 4.0–10.5)

## 2018-01-01 LAB — I-STAT BETA HCG BLOOD, ED (MC, WL, AP ONLY)

## 2018-01-01 LAB — PROTIME-INR
INR: 1.12
Prothrombin Time: 14.3 seconds (ref 11.4–15.2)

## 2018-01-01 LAB — I-STAT CG4 LACTIC ACID, ED: Lactic Acid, Venous: 2.41 mmol/L (ref 0.5–1.9)

## 2018-01-01 MED ORDER — SODIUM CHLORIDE 0.9 % IV SOLN
Freq: Once | INTRAVENOUS | Status: AC
  Administered 2018-01-02: via INTRAVENOUS

## 2018-01-01 MED ORDER — LACTATED RINGERS IV BOLUS
500.0000 mL | Freq: Once | INTRAVENOUS | Status: AC
  Administered 2018-01-01: 500 mL via INTRAVENOUS

## 2018-01-01 MED ORDER — SODIUM CHLORIDE 0.9 % IV SOLN
INTRAVENOUS | Status: DC | PRN
  Administered 2018-01-01: 22:00:00 via INTRAVENOUS

## 2018-01-01 MED ORDER — LACTATED RINGERS IV BOLUS
1000.0000 mL | Freq: Once | INTRAVENOUS | Status: AC
  Administered 2018-01-01: 1000 mL via INTRAVENOUS

## 2018-01-01 MED ORDER — SODIUM CHLORIDE 0.9 % IV SOLN
2.0000 g | Freq: Once | INTRAVENOUS | Status: AC
  Administered 2018-01-01: 2 g via INTRAVENOUS
  Filled 2018-01-01: qty 2

## 2018-01-01 MED ORDER — SODIUM CHLORIDE 0.9 % IV SOLN
1.0000 g | Freq: Three times a day (TID) | INTRAVENOUS | Status: DC
  Filled 2018-01-01: qty 1

## 2018-01-01 MED ORDER — SODIUM CHLORIDE 0.9 % IV BOLUS
1000.0000 mL | Freq: Once | INTRAVENOUS | Status: AC
  Administered 2018-01-01: 1000 mL via INTRAVENOUS

## 2018-01-01 NOTE — ED Notes (Signed)
Phlebotomy at bedside.

## 2018-01-01 NOTE — ED Provider Notes (Signed)
[facility] EMERGENCY DEPARTMENT Provider Note   CSN: [id number] Arrival date & time: 01/01/18  1724     History   Chief Complaint Chief Complaint  Patient presents with  . Fever    HPI [name] is a 37 y.o. female.  HPI  37 year old female with history of multiple sclerosis, paraplegia, multiple previous UTIs as well as sacral wound here with fever and generalized weakness.  The patient states that for the last 2 days, she is began to have fevers up to 102 and 103 at her facility.  She was given a dose of Rocephin yesterday but had persistent fevers today so sent here for evaluation.  She does note that her urine has been more cloudy and malodorous.  She has a history of previous UTIs in the past.  Patient has had no cough.  She states the wound on her buttocks seems to be improving and she is had no drainage or redness around it.  Denies any other areas of pain.  No known recent sick contacts.  No other medical complaints.  She feels generally weak and unwell, but has been mentating at her baseline according to family.   Past Medical History:  Diagnosis Date  . Bladder stones    Hx of  . Depression   . GERD (gastroesophageal reflux disease)   . Intertrigo 04/24/2017  . Left upper extremity swelling 04/24/2017  . Multiple sclerosis ([place])   . Paraplegia ([place])   . PTSD (post-traumatic stress disorder)   . Transverse myelitis ([place])   . Yeast infection 09/26/2017    Patient Active Problem List   Diagnosis Date Noted  . UTI (urinary tract infection) 01/01/2018  . Yeast infection 09/26/2017  . Intertrigo 04/24/2017  . Left upper extremity swelling 04/24/2017  . DVT (deep venous thrombosis) ([place]) 04/24/2017  . Hypotension due to hypovolemia   . Thrombocytopenia ([place])   . Acute kidney injury (nontraumatic) ([place])   . Sepsis associated hypotension ([place]) 03/26/2017  . AKI (acute kidney injury) ([place]) 03/26/2017  . Somnolence   . Facial droop   . MDR  Acinetobacter baumannii infection   . Acinetobacter lwoffi infection   . Proteus infection   . MRSA infection   . Decubitus ulcer of left ischial area, stage III ([place]) 11/15/2016  . Diarrhea 07/17/2016  . Osteomyelitis, pelvis ([place]) 02/16/2016  . Constipation 10/29/2014  . Multiple sclerosis ([place]) 10/16/2011  . Paraplegia ([place])   . History of transverse myelitis     Past Surgical History:  Procedure Laterality Date  . CHOLECYSTECTOMY    . CYSTECTOMY W/ URETEROILEAL CONDUIT    . IR FLUORO GUIDE CV LINE LEFT  02/25/2017  . IR US GUIDE VASC ACCESS LEFT  02/25/2017  . IRRIGATION AND DEBRIDEMENT ABSCESS N/A 02/18/2017   Procedure: IRRIGATION AND DEBRIDEMENT ABSCESS;  Surgeon: Excell Seltzer, MD;  Location: [facility];  Service: General;  Laterality: N/A;  . TONSILLECTOMY    . WISDOM TOOTH EXTRACTION       OB History    Gravida  0   Para  0   Term  0   Preterm  0   AB  0   Living  0     SAB  0   TAB  0   Ectopic  0   Multiple  0   Live Births  0            Home Medications    Prior to Admission medications   Medication  Sig Start Date End Date Taking? Authorizing Provider  acetaminophen (TYLENOL) 325 MG tablet Take 650 mg by mouth every 6 (six) hours as needed for fever.   Yes [provider]  baclofen (LIORESAL) 20 MG tablet Take 20 mg by mouth 3 (three) times daily.   Yes [provider]  bisacodyl (DULCOLAX) 10 MG suppository Place 1 suppository (10 mg total) daily rectally. Patient taking differently: Place 10 mg rectally daily as needed for mild constipation.  02/15/17  Yes Florencia Reasons, MD  diazepam (VALIUM) 5 MG tablet Take 1-2 tablets (5-10 mg total) by mouth 3 (three) times daily. Take 1 tablet in AM, 1 tablet in PM, and 2 tablets at bedtime 04/30/17  Yes Mikhail, Alanson, DO  diphenhydrAMINE (BENADRYL) 25 mg capsule Take 1 capsule (25 mg total) by mouth every 8 (eight) hours as needed for itching. Patient taking differently: Take 50 mg  by mouth every 8 (eight) hours as needed for itching.  03/12/17  Yes Rosita Fire, MD  fluticasone [facility]) 50 MCG/ACT nasal spray Place 2 sprays into the nose daily as needed for allergies.    Yes [provider]  gabapentin (NEURONTIN) 400 MG capsule Take 2 capsules (800 mg total) by mouth 3 (three) times daily. 08/13/16  Yes Jaffe, Adam R, DO  ibuprofen (ADVIL,MOTRIN) 200 MG tablet Take 200-400 mg by mouth every 6 (six) hours as needed for headache or moderate pain.    Yes [provider]  lamoTRIgine (LAMICTAL) 25 MG tablet Take 2 tablets (50 mg total) by mouth daily. 04/03/17  Yes Bonnielee Haff, MD  loperamide (IMODIUM) 2 MG capsule Take 2 capsules (4 mg total) by mouth 3 (three) times daily as needed for diarrhea or loose stools. 04/03/17  Yes Bonnielee Haff, MD  loratadine (CLARITIN) 5 MG/5ML syrup Take 5 mg daily as needed by mouth for allergies.    Yes [provider]  omeprazole (PRILOSEC) 40 MG capsule Take 40 mg by mouth daily.   Yes [provider]  ondansetron (ZOFRAN-ODT) 4 MG disintegrating tablet Take 1 tablet (4 mg total) by mouth every 8 (eight) hours as needed for nausea or vomiting. Patient taking differently: Take 4 mg by mouth every 12 (twelve) hours.  03/12/17  Yes Rosita Fire, MD  sertraline (ZOLOFT) 100 MG tablet Take 1 tablet (100 mg total) by mouth 2 (two) times daily. 04/03/17  Yes Bonnielee Haff, MD  ferrous sulfate 325 (65 FE) MG tablet Take 1 tablet (325 mg total) by mouth daily with breakfast. Patient not taking: Reported on 01/01/2018 04/04/17   Bonnielee Haff, MD  fluconazole (DIFLUCAN) 100 MG tablet Take 1 tablet (100 mg total) by mouth daily. Patient not taking: Reported on 01/01/2018 09/26/17   Tommy Medal, Lavell Islam, MD  nystatin cream (MYCOSTATIN) Apply 1 application topically 3 (three) times daily. Patient not taking: Reported on 09/26/2017 04/24/17   Tommy Medal, Lavell Islam, MD  Rivaroxaban 15 & 20 MG TBPK Take as  directed on package: Start with one 15mg  tablet by mouth twice a day with food. On 05/16/2017, switch to one 20mg  tablet once a day with food. Patient not taking: Reported on 09/26/2017 04/30/17   Cristal Ford, DO    Family History Family History  Problem Relation Age of Onset  . Diabetes type II Father   . CAD Father   . Dementia Father   . Stroke Father   . Seizures Father   . Diabetes type II Mother   . Hypertension Mother   .  Breast cancer Other   . Diabetes type II Other     Social History Social History   Tobacco Use  . Smoking status: Never Smoker  . Smokeless tobacco: Never Used  Substance Use Topics  . Alcohol use: No  . Drug use: No     Allergies   Iodine; Latex; Polymyxin b; Shellfish-derived products; Vancomycin; Aspirin; and Strawberry extract   Review of Systems Review of Systems  Constitutional: Positive for chills, fatigue and fever.  HENT: Negative for congestion and rhinorrhea.   Eyes: Negative for visual disturbance.  Respiratory: Negative for cough, shortness of breath and wheezing.   Cardiovascular: Negative for chest pain and leg swelling.  Gastrointestinal: Negative for abdominal pain, diarrhea, nausea and vomiting.  Genitourinary: Negative for dysuria and flank pain.  Musculoskeletal: Negative for neck pain and neck stiffness.  Skin: Negative for rash and wound.  Allergic/Immunologic: Negative for immunocompromised state.  Neurological: Positive for weakness. Negative for syncope and headaches.  All other systems reviewed and are negative.    Physical Exam Updated Vital Signs BP 119/70 (BP Location: Left Arm)   Pulse 78   Temp 97.7 F (36.5 C) (Oral)   Resp 16   Ht 5\' 3"  (1.6 m)   Wt 74.4 kg   LMP 01/01/2018 (Exact Date)   SpO2 100%   BMI 29.05 kg/m   Physical Exam  Constitutional: She is oriented to person, place, and time. She appears well-developed and well-nourished. No distress.  HENT:  Head: Normocephalic and  atraumatic.  Mildly dry mucous membranes  Eyes: Conjunctivae are normal.  Neck: Neck supple.  Cardiovascular: Normal rate, regular rhythm and normal heart sounds. Exam reveals no friction rub.  No murmur heard. Pulmonary/Chest: Effort normal and breath sounds normal. No respiratory distress. She has no wheezes. She has no rales.  Abdominal: She exhibits no distension.  Right sided urostomy in place, draining cloudy urine  Musculoskeletal: She exhibits no edema.  Neurological: She is alert and oriented to person, place, and time. She exhibits normal muscle tone.  Skin: Skin is warm. Capillary refill takes less than 2 seconds.  Well-healed sacral decubitus ulcer, with no surrounding drainage or erythema  Psychiatric: She has a normal mood and affect.  Nursing note and vitals reviewed.    ED Treatments / Results  Labs (all labs ordered are listed, but only abnormal results are displayed) Labs Reviewed  COMPREHENSIVE METABOLIC PANEL - Abnormal; Notable for the following components:      Result Value   Potassium 3.4 (*)    Glucose, Bld 114 (*)    All other components within normal limits  CBC WITH DIFFERENTIAL/PLATELET - Abnormal; Notable for the following components:   RBC 5.54 (*)    HCT 48.0 (*)    All other components within normal limits  URINALYSIS, ROUTINE W REFLEX MICROSCOPIC - Abnormal; Notable for the following components:   Color, Urine AMBER (*)    APPearance CLOUDY (*)    Hgb urine dipstick LARGE (*)    Protein, ur 30 (*)    Leukocytes, UA LARGE (*)    Bacteria, UA FEW (*)    Non Squamous Epithelial 0-5 (*)    All other components within normal limits  I-STAT CG4 LACTIC ACID, ED - Abnormal; Notable for the following components:   Lactic Acid, Venous 2.41 (*)    All other components within normal limits  CULTURE, BLOOD (ROUTINE X 2)  CULTURE, BLOOD (ROUTINE X 2)  URINE CULTURE  PROTIME-INR  BASIC METABOLIC PANEL  CBC  I-STAT BETA HCG BLOOD, ED ([facility], [facility], [facility] ONLY)   I-STAT CG4 LACTIC ACID, ED    EKG None  Radiology Dg Chest 2 View  Result Date: 01/01/2018 CLINICAL DATA:  Fever, feeling unwell.  History of paraplegia. EXAM: CHEST - 2 VIEW COMPARISON:  Chest radiograph April 24, 2017 FINDINGS: Cardiomediastinal silhouette is normal. No pleural effusions or focal consolidations. Trachea projects midline and there is no pneumothorax. Soft tissue planes and included osseous structures are non-suspicious. Interval removal of LEFT PICC. Thoracolumbar dextroscoliosis may be positional. IMPRESSION: Negative. Electronically Signed   By: Elon Alas M.D.   On: 01/01/2018 19:35    Procedures .Critical Care Performed by: Duffy Bruce, MD Authorized by: Duffy Bruce, MD   Critical care provider statement:    Critical care time (minutes):  35   Critical care time was exclusive of:  Separately billable procedures and treating other patients and teaching time   Critical care was necessary to treat or prevent imminent or life-threatening deterioration of the following conditions:  Cardiac failure, circulatory failure and sepsis   Critical care was time spent personally by me on the following activities:  Development of treatment plan with patient or surrogate, discussions with consultants, evaluation of patient's response to treatment, examination of patient, obtaining history from patient or surrogate, ordering and performing treatments and interventions, ordering and review of laboratory studies, ordering and review of radiographic studies, pulse oximetry, re-evaluation of patient's condition and review of old charts   I assumed direction of critical care for this patient from another provider in my specialty: no     (including critical care time)  Medications Ordered in ED Medications  0.9 %  sodium chloride infusion ( Intravenous New Bag/Given 01/01/18 2132)  enoxaparin (LOVENOX) injection 40 mg (has no administration in time range)  acetaminophen  (TYLENOL) tablet 650 mg (has no administration in time range)    Or  acetaminophen (TYLENOL) suppository 650 mg (has no administration in time range)  ondansetron (ZOFRAN) tablet 4 mg (has no administration in time range)    Or  ondansetron (ZOFRAN) injection 4 mg (has no administration in time range)  cefTRIAXone (ROCEPHIN) 1 g in sodium chloride 0.9 % 100 mL IVPB (has no administration in time range)  gabapentin (NEURONTIN) capsule 800 mg (has no administration in time range)  baclofen (LIORESAL) tablet 20 mg (has no administration in time range)  bisacodyl (DULCOLAX) suppository 10 mg (has no administration in time range)  diazepam (VALIUM) tablet 5 mg (has no administration in time range)  lamoTRIgine (LAMICTAL) tablet 50 mg (has no administration in time range)  sertraline (ZOLOFT) tablet 100 mg (has no administration in time range)  pantoprazole (PROTONIX) EC tablet 80 mg (has no administration in time range)  diazepam (VALIUM) tablet 10 mg (has no administration in time range)  sodium chloride 0.9 % bolus 1,000 mL (0 mLs Intravenous Hold 01/01/18 2152)  0.9 %  sodium chloride infusion ( Intravenous New Bag/Given 01/02/18 0027)  lactated ringers bolus 1,000 mL (0 mLs Intravenous Stopped 01/01/18 2255)  lactated ringers bolus 500 mL (0 mLs Intravenous Stopped 01/01/18 2356)  ceFEPIme (MAXIPIME) 2 g in sodium chloride 0.9 % 100 mL IVPB (0 g Intravenous Stopped 01/01/18 2205)     Initial Impression / Assessment and Plan / ED Course  I have reviewed the triage vital signs and the nursing notes.  Pertinent labs & imaging results that were available during my care of the patient were reviewed by me and considered  in my medical decision making (see chart for details).     38 yo F here with fever, generalized weakness. Clinically, pt is non-toxic but she is febrile, tachycardic with elevated LA. Code sepsis initiated. Cefepime started based on prior UCx results. Regarding her prior wound, her  sacral wound appears c/d/i on my exam and is healing well - no signs of acute superinfection clinically, and no other evidence to suggest MRSA cellulitis to necessitate dapto (vanco allergy). Otherwise, abdomen is soft, NT, ND. She does admit to change in urine and reportedly does not regularly change her urine bag at facility - suspect possible UTI .UCx sent, pt clinically improving with fluids, ABX. Admit to medicine.  Final Clinical Impressions(s) / ED Diagnoses   Final diagnoses:  Sepsis due to urinary tract infection [facility])    ED Discharge Orders    None       Duffy Bruce, MD 01/02/18 0246

## 2018-01-01 NOTE — Progress Notes (Signed)
Pharmacy Antibiotic Note  [place] [name] is a 37 y.o. female admitted on 01/01/2018 with UTI/sepsis.  Pharmacy has been consulted for cefepime dosing. Pt is paraplegic with baseline Scr of 0.3, today Scr is 0.61, indicating a probable acute kidney injury. 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 Pt has an anaphylactic reaction to vanc listed in allergies. WBC - 8.0, lactic acid - 2.41  Plan: Cefepime 2g x1, then 1g q8h Monitor renal fx Adjust abx therapy pending ID culture results  Height: 5\' 3"  (160 cm) Weight: 164 lb (74.4 kg) IBW/kg (Calculated) : 52.4  No data recorded.  Recent Labs  Lab 01/01/18 1836 01/01/18 1859  WBC 8.0  --   CREATININE 0.61  --   LATICACIDVEN  --  2.41*    Estimated Creatinine Clearance: 93.9 mL/min (by C-G formula based on SCr of 0.61 mg/dL).    Allergies  Allergen Reactions  . Iodine Anaphylaxis  . Latex Anaphylaxis  . Polymyxin B Hives    Pt had hives in right arm with infusion of polymyxin on November 26th, 2018  . Shellfish-Derived Products Anaphylaxis    Reaction unknown  . Vancomycin Anaphylaxis  . Aspirin Other (See Comments)    Reaction unknown  . Strawberry Extract Hives and Rash    Antimicrobials this admission: cefepime 10/2 >>    Dose adjustments this admission: N/A  Microbiology results: pending  Thank you for allowing pharmacy to be a part of this patient's care.  Tyson Babinski 01/01/2018 8:35 PM

## 2018-01-01 NOTE — H&P (Signed)
History and Physical    [name] [id number]:[id number] DOB: Dec 25, 1980 DOA: 01/01/2018  PCP: Lennie Odor, PA-C  Patient coming from: [name]  I have personally briefly reviewed patient's old medical records in [place]  Chief Complaint: fevers  HPI: [name] is a 37 y.o. female with medical history significant of MS, transverse myelitis, paraplegia, hx of multiple UTIs s/p diverting urostomy, and sacral osteomyelitis who presents with two days of fevers and generalized weakness. Reported temperature up to 102-103F at her facility. She was given a shot of IM Rocephin one day ago, but sent to the ED given continued fevers. Patient is unable to sense dysuria due to her lack of sensation, but reports similar symptoms of fevers and generalized malaise with prior UTIs, last about 1 year ago. She reports significant improvement in her sacral wound. She had an episode of emesis yesterday and continued nausea today. She denies any diaphoresis, chest pain, or dyspnea.  ED Course:  Initial BP 91/55, Pulse 87, RR 18, Temp 68F, SpO2 98%. Labs notable for K 3.4, WBC 8.0, lactic 2.41, beta hCG <5.0, urinalysis large leukocytes/negative nitrites. CXR with no acute cardiopulmonary disease. She was given 2.5 L bolus of IV fluids and started on Cefepime.  Review of Systems: As per HPI otherwise 10 point review of systems negative.    Past Medical History:  Diagnosis Date  . Bladder stones    Hx of  . Depression   . GERD (gastroesophageal reflux disease)   . Intertrigo 04/24/2017  . Left upper extremity swelling 04/24/2017  . Multiple sclerosis ([place])   . Paraplegia ([place])   . PTSD (post-traumatic stress disorder)   . Transverse myelitis ([place])   . Yeast infection 09/26/2017    Past Surgical History:  Procedure Laterality Date  . CHOLECYSTECTOMY    . CYSTECTOMY W/ URETEROILEAL CONDUIT    . IR FLUORO GUIDE CV LINE LEFT  02/25/2017  . IR US GUIDE VASC ACCESS LEFT  02/25/2017  .  IRRIGATION AND DEBRIDEMENT ABSCESS N/A 02/18/2017   Procedure: IRRIGATION AND DEBRIDEMENT ABSCESS;  Surgeon: Excell Seltzer, MD;  Location: [facility];  Service: General;  Laterality: N/A;  . TONSILLECTOMY    . WISDOM TOOTH EXTRACTION       reports that she has never smoked. She has never used smokeless tobacco. She reports that she does not drink alcohol or use drugs.  Allergies  Allergen Reactions  . Iodine Anaphylaxis  . Latex Anaphylaxis  . Polymyxin B Hives    Pt had hives in right arm with infusion of polymyxin on November 26th, 2018  . Shellfish-Derived Products Anaphylaxis    Reaction unknown  . Vancomycin Anaphylaxis  . Aspirin Other (See Comments)    Reaction unknown  . Strawberry Extract Hives and Rash    Family History  Problem Relation Age of Onset  . Diabetes type II Father   . CAD Father   . Dementia Father   . Stroke Father   . Seizures Father   . Diabetes type II Mother   . Hypertension Mother   . Breast cancer Other   . Diabetes type II Other     Prior to Admission medications   Medication Sig Start Date End Date Taking? Authorizing Provider  acetaminophen (TYLENOL) 325 MG tablet Take 650 mg by mouth every 6 (six) hours as needed for fever.   Yes [provider]  baclofen (LIORESAL) 20 MG tablet Take 20 mg by mouth 3 (three) times daily.  Yes [provider]  bisacodyl (DULCOLAX) 10 MG suppository Place 1 suppository (10 mg total) daily rectally. Patient taking differently: Place 10 mg rectally daily as needed for mild constipation.  02/15/17  Yes Florencia Reasons, MD  diazepam (VALIUM) 5 MG tablet Take 1-2 tablets (5-10 mg total) by mouth 3 (three) times daily. Take 1 tablet in AM, 1 tablet in PM, and 2 tablets at bedtime 04/30/17  Yes Mikhail, [place], DO  diphenhydrAMINE (BENADRYL) 25 mg capsule Take 1 capsule (25 mg total) by mouth every 8 (eight) hours as needed for itching. Patient taking differently: Take 50 mg by mouth every 8 (eight)  hours as needed for itching.  03/12/17  Yes Rosita Fire, MD  fluticasone [facility]) 50 MCG/ACT nasal spray Place 2 sprays into the nose daily as needed for allergies.    Yes [provider]  gabapentin (NEURONTIN) 400 MG capsule Take 2 capsules (800 mg total) by mouth 3 (three) times daily. 08/13/16  Yes Jaffe, Adam R, DO  ibuprofen (ADVIL,MOTRIN) 200 MG tablet Take 200-400 mg by mouth every 6 (six) hours as needed for headache or moderate pain.    Yes [provider]  lamoTRIgine (LAMICTAL) 25 MG tablet Take 2 tablets (50 mg total) by mouth daily. 04/03/17  Yes Bonnielee Haff, MD  loperamide (IMODIUM) 2 MG capsule Take 2 capsules (4 mg total) by mouth 3 (three) times daily as needed for diarrhea or loose stools. 04/03/17  Yes Bonnielee Haff, MD  loratadine (CLARITIN) 5 MG/5ML syrup Take 5 mg daily as needed by mouth for allergies.    Yes [provider]  omeprazole (PRILOSEC) 40 MG capsule Take 40 mg by mouth daily.   Yes [provider]  ondansetron (ZOFRAN-ODT) 4 MG disintegrating tablet Take 1 tablet (4 mg total) by mouth every 8 (eight) hours as needed for nausea or vomiting. Patient taking differently: Take 4 mg by mouth every 12 (twelve) hours.  03/12/17  Yes Rosita Fire, MD  sertraline (ZOLOFT) 100 MG tablet Take 1 tablet (100 mg total) by mouth 2 (two) times daily. 04/03/17  Yes Bonnielee Haff, MD  ferrous sulfate 325 (65 FE) MG tablet Take 1 tablet (325 mg total) by mouth daily with breakfast. Patient not taking: Reported on 01/01/2018 04/04/17   Bonnielee Haff, MD  fluconazole (DIFLUCAN) 100 MG tablet Take 1 tablet (100 mg total) by mouth daily. Patient not taking: Reported on 01/01/2018 09/26/17   Tommy Medal, Lavell Islam, MD  nystatin cream (MYCOSTATIN) Apply 1 application topically 3 (three) times daily. Patient not taking: Reported on 09/26/2017 04/24/17   Tommy Medal, Lavell Islam, MD  Rivaroxaban 15 & 20 MG TBPK Take as directed on package: Start  with one 15mg  tablet by mouth twice a day with food. On 05/16/2017, switch to one 20mg  tablet once a day with food. Patient not taking: Reported on 09/26/2017 04/30/17   Cristal Ford, DO    Physical Exam: Vitals:   01/01/18 2322 01/01/18 2330 01/02/18 0030 01/02/18 0205  BP: 100/64 112/65 108/64 119/70  Pulse: 75 75 77 78  Resp: 16 16 14 16   Temp: 98 F (36.7 C)   97.7 F (36.5 C)  TempSrc: Oral   Oral  SpO2: 98% 98% 98% 100%  Weight:      Height:        Constitutional: NAD, calm, comfortable Vitals:   01/01/18 2322 01/01/18 2330 01/02/18 0030 01/02/18 0205  BP: 100/64 112/65 108/64 119/70  Pulse: 75 75 77 78  Resp: 16 16 14 16   Temp: 98 F (36.7 C)   97.7 F (36.5 C)  TempSrc: Oral   Oral  SpO2: 98% 98% 98% 100%  Weight:      Height:       Eyes: PERRL, lids and conjunctivae normal ENMT: Mucous membranes are dry. Posterior pharynx clear of any exudate or lesions. Neck: normal, supple, no masses, no thyromegaly Respiratory: clear to auscultation anteriorly, no wheezing, no crackles. Normal respiratory effort. No accessory muscle use.  Cardiovascular: Regular rate and rhythm, no murmurs / rubs / gallops. Slight edema both feet.  Abdomen: urostomy right lower abdomen, no tenderness, no masses palpated. No hepatosplenomegaly. Bowel sounds positive.  Musculoskeletal: no clubbing / cyanosis. Contractures of both feet. Skin: sacral wound appears to be well healed without erythema or discharge Neurologic: upper extremities ROM limited, hand grip decreased Psychiatric: Normal judgment and insight. Alert and oriented x 3. Normal mood.   Labs on Admission: I have personally reviewed following labs and imaging studies  CBC: Recent Labs  Lab 01/01/18 1836  WBC 8.0  NEUTROABS 6.0  HGB 15.0  HCT 48.0*  MCV 86.6  PLT PLATELET CLUMPS NOTED ON SMEAR, COUNT APPEARS ADEQUATE   Basic Metabolic Panel: Recent Labs  Lab 01/01/18 1836  NA 138  K 3.4*  CL 101  CO2 23  GLUCOSE  114*  BUN 9  CREATININE 0.61  CALCIUM 9.2   GFR: Estimated Creatinine Clearance: 93.9 mL/min (by C-G formula based on SCr of 0.61 mg/dL). Liver Function Tests: Recent Labs  Lab 01/01/18 1836  AST 21  ALT 19  ALKPHOS 71  BILITOT 0.7  PROT 6.6  ALBUMIN 3.6   No results for input(s): LIPASE, AMYLASE in the last 168 hours. No results for input(s): AMMONIA in the last 168 hours. Coagulation Profile: Recent Labs  Lab 01/01/18 2110  INR 1.12   Cardiac Enzymes: No results for input(s): CKTOTAL, CKMB, CKMBINDEX, TROPONINI in the last 168 hours. BNP (last 3 results) No results for input(s): PROBNP in the last 8760 hours. HbA1C: No results for input(s): HGBA1C in the last 72 hours. CBG: No results for input(s): GLUCAP in the last 168 hours. Lipid Profile: No results for input(s): CHOL, HDL, LDLCALC, TRIG, CHOLHDL, LDLDIRECT in the last 72 hours. Thyroid Function Tests: No results for input(s): TSH, T4TOTAL, FREET4, T3FREE, THYROIDAB in the last 72 hours. Anemia Panel: No results for input(s): VITAMINB12, FOLATE, FERRITIN, TIBC, IRON, RETICCTPCT in the last 72 hours. Urine analysis:    Component Value Date/Time   COLORURINE AMBER (A) 01/01/2018 2151   APPEARANCEUR CLOUDY (A) 01/01/2018 2151   LABSPEC 1.012 01/01/2018 2151   PHURINE 7.0 01/01/2018 2151   GLUCOSEU NEGATIVE 01/01/2018 2151   HGBUR LARGE (A) 01/01/2018 2151   BILIRUBINUR NEGATIVE 01/01/2018 2151   [place] NEGATIVE 01/01/2018 2151   PROTEINUR 30 (A) 01/01/2018 2151   UROBILINOGEN 2.0 (H) 10/29/2014 1627   NITRITE NEGATIVE 01/01/2018 2151   LEUKOCYTESUR LARGE (A) 01/01/2018 2151    Radiological Exams on Admission: Dg Chest 2 View  Result Date: 01/01/2018 CLINICAL DATA:  Fever, feeling unwell.  History of paraplegia. EXAM: CHEST - 2 VIEW COMPARISON:  Chest radiograph April 24, 2017 FINDINGS: Cardiomediastinal silhouette is normal. No pleural effusions or focal consolidations. Trachea projects midline and  there is no pneumothorax. Soft tissue planes and included osseous structures are non-suspicious. Interval removal of LEFT PICC. Thoracolumbar dextroscoliosis may be positional. IMPRESSION: Negative. Electronically Signed   By: Elon Alas M.D.   On:  01/01/2018 19:35    EKG: not performed  Assessment/Plan Principal Problem:   UTI (urinary tract infection) Active Problems:   Paraplegia ([place])  Carollee E Worthey is a 37 y.o. female with medical history significant of MS, transverse myelitis, paraplegia, hx of multiple UTIs s/p diverting urostomy, and sacral osteomyelitis who presents with two days of fevers and generalized weakness suspected secondary to UTI.   UTI: Likely source of fevers. Urine sample here may be sterilized after Rocephin given at SNF.  - Narrow abx to Ceftriaxone - last urine culture on file showed sensitive klebsiella - f/u urine cultures  Mild Hypokalemia: K 3.4, supplement 40 mEq po.  Hx of Sacral Osteomyelitis: Sacral wound appears well healed, low suspicion for recurrent pelvic/sacral osteomyelitis at this time but will need to consider further investigation if fevers persist despite treatment of UTI.  Paraplegia/Hx of MS/Transverse Myelitis: Continue home Valium (10 mg qhs, 5 mg BIDAC), Baclofen 20 mg TID, Gabapentin 800 mg TID, Lamotrigine 50 mg po daily   DVT prophylaxis: lovenox Code Status: FULL - would not want prolonged life support Family Communication: mother at bedside Disposition Plan: return to [facility] SNF in 1-2 days Consults called: none Admission status: obs, med-surg   Zada Finders MD [facility] Pager [phone]  If 7PM-7AM, please contact night-coverage [email or web address] Password [email or web address]  01/02/2018, 3:44 AM

## 2018-01-01 NOTE — ED Notes (Signed)
Called pharmacy to verify compatibility. Pharm says run cefeime (this brand with LR), NS flush is ok.

## 2018-01-01 NOTE — ED Triage Notes (Signed)
Per EMS pt from [facility] and has fever of 103. Received 1G of rocephin at [facility]. Pt has urostomy and is paraplegic. Ems gave 1000mg  tylenol.

## 2018-01-01 NOTE — ED Notes (Signed)
IV team remains at bedside 

## 2018-01-01 NOTE — ED Notes (Signed)
2 RN's have tried to get IV's on this pt. IV Team consulted previously.

## 2018-01-02 ENCOUNTER — Encounter (HOSPITAL_COMMUNITY): Payer: Self-pay

## 2018-01-02 ENCOUNTER — Other Ambulatory Visit: Payer: Self-pay

## 2018-01-02 DIAGNOSIS — Z91013 Allergy to seafood: Secondary | ICD-10-CM | POA: Diagnosis not present

## 2018-01-02 DIAGNOSIS — G8220 Paraplegia, unspecified: Secondary | ICD-10-CM

## 2018-01-02 DIAGNOSIS — I9589 Other hypotension: Secondary | ICD-10-CM | POA: Diagnosis not present

## 2018-01-02 DIAGNOSIS — Z881 Allergy status to other antibiotic agents status: Secondary | ICD-10-CM | POA: Diagnosis not present

## 2018-01-02 DIAGNOSIS — F4310 Post-traumatic stress disorder, unspecified: Secondary | ICD-10-CM | POA: Diagnosis present

## 2018-01-02 DIAGNOSIS — K219 Gastro-esophageal reflux disease without esophagitis: Secondary | ICD-10-CM | POA: Diagnosis present

## 2018-01-02 DIAGNOSIS — Z91040 Latex allergy status: Secondary | ICD-10-CM | POA: Diagnosis not present

## 2018-01-02 DIAGNOSIS — G35 Multiple sclerosis: Secondary | ICD-10-CM

## 2018-01-02 DIAGNOSIS — E861 Hypovolemia: Secondary | ICD-10-CM

## 2018-01-02 DIAGNOSIS — R402143 Coma scale, eyes open, spontaneous, at hospital admission: Secondary | ICD-10-CM | POA: Diagnosis present

## 2018-01-02 DIAGNOSIS — Z79899 Other long term (current) drug therapy: Secondary | ICD-10-CM | POA: Diagnosis not present

## 2018-01-02 DIAGNOSIS — R402253 Coma scale, best verbal response, oriented, at hospital admission: Secondary | ICD-10-CM | POA: Diagnosis present

## 2018-01-02 DIAGNOSIS — A419 Sepsis, unspecified organism: Secondary | ICD-10-CM | POA: Diagnosis not present

## 2018-01-02 DIAGNOSIS — Z936 Other artificial openings of urinary tract status: Secondary | ICD-10-CM | POA: Diagnosis not present

## 2018-01-02 DIAGNOSIS — R7881 Bacteremia: Secondary | ICD-10-CM | POA: Diagnosis not present

## 2018-01-02 DIAGNOSIS — N390 Urinary tract infection, site not specified: Secondary | ICD-10-CM | POA: Diagnosis present

## 2018-01-02 DIAGNOSIS — R402363 Coma scale, best motor response, obeys commands, at hospital admission: Secondary | ICD-10-CM | POA: Diagnosis present

## 2018-01-02 DIAGNOSIS — E876 Hypokalemia: Secondary | ICD-10-CM | POA: Diagnosis present

## 2018-01-02 DIAGNOSIS — A4159 Other Gram-negative sepsis: Secondary | ICD-10-CM | POA: Diagnosis not present

## 2018-01-02 DIAGNOSIS — M4628 Osteomyelitis of vertebra, sacral and sacrococcygeal region: Secondary | ICD-10-CM | POA: Diagnosis present

## 2018-01-02 DIAGNOSIS — F329 Major depressive disorder, single episode, unspecified: Secondary | ICD-10-CM | POA: Diagnosis present

## 2018-01-02 DIAGNOSIS — Z9102 Food additives allergy status: Secondary | ICD-10-CM | POA: Diagnosis not present

## 2018-01-02 DIAGNOSIS — Z886 Allergy status to analgesic agent status: Secondary | ICD-10-CM | POA: Diagnosis not present

## 2018-01-02 DIAGNOSIS — N3000 Acute cystitis without hematuria: Secondary | ICD-10-CM | POA: Diagnosis not present

## 2018-01-02 DIAGNOSIS — Z91041 Radiographic dye allergy status: Secondary | ICD-10-CM | POA: Diagnosis not present

## 2018-01-02 DIAGNOSIS — Z906 Acquired absence of other parts of urinary tract: Secondary | ICD-10-CM | POA: Diagnosis not present

## 2018-01-02 LAB — BLOOD CULTURE ID PANEL (REFLEXED)
ACINETOBACTER BAUMANNII: NOT DETECTED
CANDIDA ALBICANS: NOT DETECTED
CANDIDA KRUSEI: NOT DETECTED
CANDIDA PARAPSILOSIS: NOT DETECTED
Candida glabrata: NOT DETECTED
Candida tropicalis: NOT DETECTED
ENTEROBACTERIACEAE SPECIES: NOT DETECTED
ENTEROCOCCUS SPECIES: NOT DETECTED
ESCHERICHIA COLI: NOT DETECTED
Enterobacter cloacae complex: NOT DETECTED
HAEMOPHILUS INFLUENZAE: NOT DETECTED
KLEBSIELLA OXYTOCA: NOT DETECTED
Klebsiella pneumoniae: NOT DETECTED
LISTERIA MONOCYTOGENES: NOT DETECTED
Neisseria meningitidis: NOT DETECTED
PSEUDOMONAS AERUGINOSA: NOT DETECTED
Proteus species: NOT DETECTED
STREPTOCOCCUS PYOGENES: NOT DETECTED
STREPTOCOCCUS SPECIES: NOT DETECTED
Serratia marcescens: NOT DETECTED
Staphylococcus aureus (BCID): NOT DETECTED
Staphylococcus species: NOT DETECTED
Streptococcus agalactiae: NOT DETECTED
Streptococcus pneumoniae: NOT DETECTED

## 2018-01-02 LAB — CBC
HCT: 42 % (ref 36.0–46.0)
Hemoglobin: 13.5 g/dL (ref 12.0–15.0)
MCH: 27.1 pg (ref 26.0–34.0)
MCHC: 32.1 g/dL (ref 30.0–36.0)
MCV: 84.2 fL (ref 78.0–100.0)
PLATELETS: 154 10*3/uL (ref 150–400)
RBC: 4.99 MIL/uL (ref 3.87–5.11)
RDW: 15.2 % (ref 11.5–15.5)
WBC: 5.6 10*3/uL (ref 4.0–10.5)

## 2018-01-02 LAB — BASIC METABOLIC PANEL
Anion gap: 8 (ref 5–15)
BUN: 8 mg/dL (ref 6–20)
CHLORIDE: 106 mmol/L (ref 98–111)
CO2: 21 mmol/L — AB (ref 22–32)
CREATININE: 0.34 mg/dL — AB (ref 0.44–1.00)
Calcium: 8.2 mg/dL — ABNORMAL LOW (ref 8.9–10.3)
GFR calc non Af Amer: >60 mL/min (ref >60)
Glucose, Bld: 90 mg/dL (ref 70–99)
Potassium: 4 mmol/L (ref 3.5–5.1)
SODIUM: 135 mmol/L (ref 135–145)

## 2018-01-02 LAB — LACTIC ACID, PLASMA: LACTIC ACID, VENOUS: 0.7 mmol/L (ref 0.5–1.9)

## 2018-01-02 MED ORDER — BISACODYL 10 MG RE SUPP: 10.0000 mg | Freq: Every day | RECTAL | Status: DC | PRN

## 2018-01-02 MED ORDER — PANTOPRAZOLE SODIUM 40 MG PO TBEC
80.0000 mg | DELAYED_RELEASE_TABLET | Freq: Every day | ORAL | Status: DC
  Administered 2018-01-02 – 2018-01-04 (×3): 80 mg via ORAL
  Filled 2018-01-02 (×3): qty 2

## 2018-01-02 MED ORDER — ONDANSETRON HCL 4 MG PO TABS: 4.0000 mg | ORAL_TABLET | Freq: Four times a day (QID) | ORAL | Status: DC | PRN

## 2018-01-02 MED ORDER — DIAZEPAM 5 MG PO TABS
5.0000 mg | ORAL_TABLET | Freq: Two times a day (BID) | ORAL | Status: DC
  Administered 2018-01-02 – 2018-01-04 (×5): 5 mg via ORAL
  Filled 2018-01-02 (×5): qty 1

## 2018-01-02 MED ORDER — SODIUM CHLORIDE 0.9 % IV SOLN
1.0000 g | INTRAVENOUS | Status: DC
  Administered 2018-01-02 – 2018-01-04 (×3): 1 g via INTRAVENOUS
  Filled 2018-01-02 (×5): qty 10

## 2018-01-02 MED ORDER — BACLOFEN 20 MG PO TABS
20.0000 mg | ORAL_TABLET | Freq: Three times a day (TID) | ORAL | Status: DC
  Administered 2018-01-02 – 2018-01-04 (×7): 20 mg via ORAL
  Filled 2018-01-02: qty 2
  Filled 2018-01-02 (×2): qty 1
  Filled 2018-01-02: qty 2
  Filled 2018-01-02: qty 1
  Filled 2018-01-02 (×2): qty 2
  Filled 2018-01-02 (×3): qty 1
  Filled 2018-01-02 (×2): qty 2
  Filled 2018-01-02 (×2): qty 1
  Filled 2018-01-02: qty 2
  Filled 2018-01-02: qty 1

## 2018-01-02 MED ORDER — DIAZEPAM 5 MG PO TABS
10.0000 mg | ORAL_TABLET | Freq: Every day | ORAL | Status: DC
  Administered 2018-01-02 – 2018-01-03 (×2): 10 mg via ORAL
  Filled 2018-01-02 (×2): qty 2

## 2018-01-02 MED ORDER — ENOXAPARIN SODIUM 40 MG/0.4ML ~~LOC~~ SOLN
40.0000 mg | SUBCUTANEOUS | Status: DC
  Administered 2018-01-02 – 2018-01-04 (×3): 40 mg via SUBCUTANEOUS
  Filled 2018-01-02 (×3): qty 0.4

## 2018-01-02 MED ORDER — ACETAMINOPHEN 650 MG RE SUPP: 650.0000 mg | Freq: Four times a day (QID) | RECTAL | Status: DC | PRN

## 2018-01-02 MED ORDER — GABAPENTIN 400 MG PO CAPS
800.0000 mg | ORAL_CAPSULE | Freq: Three times a day (TID) | ORAL | Status: DC
  Administered 2018-01-02 – 2018-01-04 (×7): 800 mg via ORAL
  Filled 2018-01-02 (×7): qty 2

## 2018-01-02 MED ORDER — ONDANSETRON HCL 4 MG/2ML IJ SOLN
4.0000 mg | Freq: Four times a day (QID) | INTRAMUSCULAR | Status: DC | PRN
  Administered 2018-01-02: 4 mg via INTRAVENOUS
  Filled 2018-01-02: qty 2

## 2018-01-02 MED ORDER — ACETAMINOPHEN 325 MG PO TABS: 650.0000 mg | ORAL_TABLET | Freq: Four times a day (QID) | ORAL | Status: DC | PRN

## 2018-01-02 MED ORDER — LAMOTRIGINE 25 MG PO TABS
50.0000 mg | ORAL_TABLET | Freq: Every day | ORAL | Status: DC
  Administered 2018-01-02 – 2018-01-04 (×3): 50 mg via ORAL
  Filled 2018-01-02 (×3): qty 2

## 2018-01-02 MED ORDER — SERTRALINE HCL 100 MG PO TABS
100.0000 mg | ORAL_TABLET | Freq: Two times a day (BID) | ORAL | Status: DC
  Administered 2018-01-02 – 2018-01-04 (×5): 100 mg via ORAL
  Filled 2018-01-02 (×5): qty 1

## 2018-01-02 MED ORDER — POTASSIUM CHLORIDE CRYS ER 20 MEQ PO TBCR
40.0000 meq | EXTENDED_RELEASE_TABLET | Freq: Once | ORAL | Status: AC
  Administered 2018-01-02: 40 meq via ORAL
  Filled 2018-01-02: qty 2

## 2018-01-02 NOTE — Progress Notes (Signed)
PHARMACY - PHYSICIAN COMMUNICATION CRITICAL VALUE ALERT - BLOOD CULTURE IDENTIFICATION (BCID)  Results for orders placed or performed during the hospital encounter of 01/01/18  Blood Culture ID Panel (Reflexed) (Collected: 01/01/2018  9:10 PM)  Result Value Ref Range   Enterococcus species NOT DETECTED NOT DETECTED   Listeria monocytogenes NOT DETECTED NOT DETECTED   Staphylococcus species NOT DETECTED NOT DETECTED   Staphylococcus aureus (BCID) NOT DETECTED NOT DETECTED   Streptococcus species NOT DETECTED NOT DETECTED   Streptococcus agalactiae NOT DETECTED NOT DETECTED   Streptococcus pneumoniae NOT DETECTED NOT DETECTED   Streptococcus pyogenes NOT DETECTED NOT DETECTED   Acinetobacter baumannii NOT DETECTED NOT DETECTED   Enterobacteriaceae species NOT DETECTED NOT DETECTED   Enterobacter cloacae complex NOT DETECTED NOT DETECTED   Escherichia coli NOT DETECTED NOT DETECTED   Klebsiella oxytoca NOT DETECTED NOT DETECTED   Klebsiella pneumoniae NOT DETECTED NOT DETECTED   Proteus species NOT DETECTED NOT DETECTED   Serratia marcescens NOT DETECTED NOT DETECTED   Haemophilus influenzae NOT DETECTED NOT DETECTED   Neisseria meningitidis NOT DETECTED NOT DETECTED   Pseudomonas aeruginosa NOT DETECTED NOT DETECTED   Candida albicans NOT DETECTED NOT DETECTED   Candida glabrata NOT DETECTED NOT DETECTED   Candida krusei NOT DETECTED NOT DETECTED   Candida parapsilosis NOT DETECTED NOT DETECTED   Candida tropicalis NOT DETECTED NOT DETECTED    Name of physician (or Provider) Contacted: K Schorr, and ID pharmacist  Changes to prescribed antibiotics required: Continue Current Rocephin  Maycol Hoying S. Alford [place], PharmD, [place] Clinical Staff Pharmacist  Eilene Ghazi Stillinger 01/02/2018  10:23 PM

## 2018-01-02 NOTE — Clinical Social Work Note (Signed)
Clinical Social Work Assessment  Patient Details  Name: [name] MRN: [id number] Date of Birth: 1980/08/10  Date of referral:  01/02/18               Reason for consult:  Discharge Planning                Permission sought to share information with:  Case Manager, Facility Sport and exercise psychologist, Family Supports Permission granted to share information::  Yes, Verbal Permission Granted  Name::     Hoyle Sauer  Agency::  [facility]  Relationship::  Mother  Contact Information:  [phone]  Housing/Transportation Living arrangements for the past 2 months:  Grants of Information:  Patient Patient Interpreter Needed:  None Criminal Activity/Legal Involvement Pertinent to Current Situation/Hospitalization:  No - Comment as needed Significant Relationships:  Parents Lives with:  Facility Resident Do you feel safe going back to the place where you live?  Yes Need for family participation in patient care:  Yes (Comment)  Care giving concerns:  CSW received referral for  placement at time of discharge. Spoke with patient regarding discharge plan, patient has been livng at [facility] since January 2019 and wants to be discharged back there as they are holding her bed. CSW to continue to follow and assist with discharge planning needs.     Social Worker assessment / plan:  Spoke with patient and  mother concerning patient returning to [place] upon medical discharge.   Employment status:  Unemployed Forensic scientist:  Medicaid In [place] PT Recommendations:  Not assessed at this time Information / Referral to community resources:  [place]  Patient/Family's Response to care:  Patient and   mother are eager for patient to return to [facility] as they describe excellent care here for patient's range of needs. CSW explained insurance authorization process which will not be a barrier as patient will be discharging back to [facility].      Patient/Family's Understanding of and Emotional Response to Diagnosis, Current Treatment, and Prognosis:  Patient/family is realistic regarding needs and expressed being excited to return to [facility] placement upon discharge as she enjoys the food there and has developed strong relationships with the nurses there since January 2019. Patient expressed understanding of CSW role and discharge process as well as medical condition. No questions/concerns about plan or treatment.    Emotional Assessment Appearance:  Appears stated age Attitude/Demeanor/Rapport:  Engaged, Gracious, Self-Confident Affect (typically observed):  Accepting, Adaptable Orientation:  Oriented to Self, Oriented to Place, Oriented to  Time, Oriented to Situation Alcohol / Substance use:  Not Applicable Psych involvement (Current and /or in the community):  No (Comment)  Discharge Needs  Concerns to be addressed:    Readmission within the last 30 days:    Current discharge risk:  None(receiving plenty of support from [facility]) Barriers to Discharge:  Continued Medical Work up   [employer], LCSW 01/02/2018, 3:34 PM

## 2018-01-02 NOTE — NC FL2 (Signed)
[employer] LEVEL OF CARE SCREENING TOOL     IDENTIFICATION  Patient Name: [name] Birthdate: 1980-11-01 Sex: female Admission Date (Current Location): 01/01/2018  [facility] 2 and [place] Number:  Herbalist and Address:  [facility] [place], [place] [address], [place], [place]  [address]      Provider Number: [id number]  Attending Physician Name and Address:  Alma Friendly, MD  Relative Name and Phone Number:  Hoyle Sauer (mother) [phone]    Current Level of Care: Hospital Recommended Level of Care: [place] Prior Approval Number:    Date Approved/Denied:   PASRR Number: [id number]  Discharge Plan: SNF    Current Diagnoses: Patient Active Problem List   Diagnosis Date Noted  . UTI (urinary tract infection) 01/01/2018  . Yeast infection 09/26/2017  . Intertrigo 04/24/2017  . Left upper extremity swelling 04/24/2017  . DVT (deep venous thrombosis) ([place]) 04/24/2017  . Hypotension due to hypovolemia   . Thrombocytopenia ([place])   . Acute kidney injury (nontraumatic) ([place])   . Sepsis associated hypotension ([place]) 03/26/2017  . AKI (acute kidney injury) ([place]) 03/26/2017  . Somnolence   . Facial droop   . MDR Acinetobacter baumannii infection   . Acinetobacter lwoffi infection   . Proteus infection   . MRSA infection   . Decubitus ulcer of left ischial area, stage III ([place]) 11/15/2016  . Diarrhea 07/17/2016  . 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 Constipation 10/29/2014  . Multiple sclerosis ([place]) 10/16/2011  . Paraplegia ([place])   . History of transverse myelitis     Orientation RESPIRATION BLADDER Height & Weight     Self, Time, Situation, Place  Normal Incontinent, External catheter, Urostomy Weight: 172 lb 9.9 oz (78.3 kg) Height:  5\' 3"  (160 cm)  BEHAVIORAL SYMPTOMS/MOOD NEUROLOGICAL BOWEL NUTRITION STATUS      Continent Diet(see discharge summary, some food allergies)  AMBULATORY STATUS  COMMUNICATION OF NEEDS Skin   Limited Assist Verbally Surgical wounds(open perineum right, left reddened peri area)                       Personal Care Assistance Level of Assistance  Bathing, Feeding, Dressing Bathing Assistance: Limited assistance Feeding assistance: Independent Dressing Assistance: Limited assistance     Functional Limitations Info  Sight, Hearing, Speech Sight Info: Adequate Hearing Info: Adequate Speech Info: Adequate    SPECIAL CARE FACTORS FREQUENCY  (none identified at this time)                    Contractures Contractures Info: Not present    Additional Factors Info  Allergies   Allergies Info: Allergies:  Iodine, Latex, Polymyxin B, Shellfish-derived Products, Vancomycin, Aspirin, Strawberry Extract           Current Medications (01/02/2018):  This is the current hospital active medication list Current Facility-Administered Medications  Medication Dose Route Frequency Provider Last Rate Last Dose  . 0.9 %  sodium chloride infusion   Intravenous PRN Duffy Bruce, MD 10 mL/hr at 01/02/18 1100    . acetaminophen (TYLENOL) tablet 650 mg  650 mg Oral Q6H PRN Lenore Cordia, MD       Or  . acetaminophen (TYLENOL) suppository 650 mg  650 mg Rectal Q6H PRN Zada Finders R, MD      . baclofen (LIORESAL) tablet 20 mg  20 mg Oral TID Lenore Cordia, MD   20 mg at 01/02/18 0932  . bisacodyl (  DULCOLAX) suppository 10 mg  10 mg Rectal Daily PRN Zada Finders R, MD      . cefTRIAXone (ROCEPHIN) 1 g in sodium chloride 0.9 % 100 mL IVPB  1 g Intravenous Q24H Zada Finders R, MD 200 mL/hr at 01/02/18 0710 1 g at 01/02/18 0710  . diazepam (VALIUM) tablet 10 mg  10 mg Oral QHS Patel, Vishal R, MD      . diazepam (VALIUM) tablet 5 mg  5 mg Oral BID WC Zada Finders R, MD   5 mg at 01/02/18 0932  . enoxaparin (LOVENOX) injection 40 mg  40 mg Subcutaneous Q24H Zada Finders R, MD   40 mg at 01/02/18 0534  . gabapentin (NEURONTIN) capsule 800 mg  800  mg Oral TID Lenore Cordia, MD   800 mg at 01/02/18 0932  . lamoTRIgine (LAMICTAL) tablet 50 mg  50 mg Oral Daily Lenore Cordia, MD   50 mg at 01/02/18 0933  . ondansetron (ZOFRAN) tablet 4 mg  4 mg Oral Q6H PRN Lenore Cordia, MD       Or  . ondansetron (ZOFRAN) injection 4 mg  4 mg Intravenous Q6H PRN Lenore Cordia, MD   4 mg at 01/02/18 0932  . pantoprazole (PROTONIX) EC tablet 80 mg  80 mg Oral Daily Lenore Cordia, MD   80 mg at 01/02/18 0933  . sertraline (ZOLOFT) tablet 100 mg  100 mg Oral BID Lenore Cordia, MD   100 mg at 01/02/18 0932     Discharge Medications: Please see discharge summary for a list of discharge medications.  Relevant Imaging Results:  Relevant Lab Results:   Additional Information SSN: [id number]  Alberteen Sam, LCSW

## 2018-01-02 NOTE — Progress Notes (Signed)
PROGRESS NOTE  [name] YBW:[id number] DOB: 1980-05-10 DOA: 01/01/2018 PCP: Lennie Odor, PA-C  HPI/Recap of past 24 hours: HPI from Dr Posey Pronto [name] is a 37 y.o. female with medical history significant of MS, transverse myelitis, paraplegia, hx of multiple UTIs s/p diverting urostomy, and sacral osteomyelitis who presents with two days of fevers and generalized weakness. Reported temperature up to 102-103F at her facility. She was given a shot of IM Rocephin one day ago, but sent to the ED given continued fevers. Patient is unable to sense dysuria due to her lack of sensation, but reports similar symptoms of fevers and generalized malaise with prior UTIs, last about 1 year ago. She reports significant improvement in her sacral wound. In the ED, lactic 2.41, urinalysis large leukocytes/negative nitrites. CXR with no acute cardiopulmonary disease. Pt admitted for further management.  Today, pt reported feeling better overall, still c/o nausea, denies any vomiting, abdominal pain, fever/chills, chest pain. Mother at bedside.  Assessment/Plan: Principal Problem:   UTI (urinary tract infection) Active Problems:   Paraplegia (HCC)   Hypotension due to hypovolemia  Sepsis likely 2/2 UTI Subjective fever, hypotension, lactic acidosis on presentation Currently afebrile, no leukocytosis BC X 2 pending Lactic acid 2.41--> 0.7 status post IV fluids UA with large leukocytes, wbc clumps, 21-50 WBC, large hgb (on her period) UC pending CXR unremarkable Continue IV Rocephin pending cultures  History of sacral osteomyelitis Sacral wound appears well-healed If fever persists, despite treatment, will investigate further  History of MS/paraplegia/transverse myelitis Continue home Valium, baclofen, gabapentin, lamotrigine    Code Status: Full  Family Communication: Mother at bedside  Disposition Plan: Back to nursing home  facility   Consultants:  None  Procedures:  None  Antimicrobials:  Rocephin  DVT prophylaxis: Lovenox   Objective: Vitals:   01/02/18 0030 01/02/18 0205 01/02/18 0458 01/02/18 0930  BP: 108/64 119/70 96/62 118/72  Pulse: 77 78 80 78  Resp: 14 16 16 18   Temp:  97.7 F (36.5 C) 97.9 F (36.6 C) 98 F (36.7 C)  TempSrc:  Oral Oral Axillary  SpO2: 98% 100% 98% 99%  Weight:  78.3 kg    Height:  5\' 3"  (1.6 m)      Intake/Output Summary (Last 24 hours) at 01/02/2018 1146 Last data filed at 01/02/2018 1100 Gross per 24 hour  Intake 1161.89 ml  Output 700 ml  Net 461.89 ml   Filed Weights   01/01/18 1745 01/01/18 1846 01/02/18 0205  Weight: 74.4 kg 74.4 kg 78.3 kg    Exam:   General: NAD, poor dentition  Cardiovascular: S1, S2 present  Respiratory: CTA B  Abdomen: Soft, nontender, nondistended, bowel sounds present, urostomy bag in place  Musculoskeletal: No pedal edema bilaterally, contractures of both lower extremity  Skin: Sacral wound appears to be well-healed, no redness or discharge noted  Psychiatry: Normal mood   Data Reviewed: CBC: Recent Labs  Lab 01/01/18 1836  WBC 8.0  NEUTROABS 6.0  HGB 15.0  HCT 48.0*  MCV 86.6  PLT PLATELET CLUMPS NOTED ON SMEAR, COUNT APPEARS ADEQUATE   Basic Metabolic Panel: Recent Labs  Lab 01/01/18 1836 01/02/18 0533  NA 138 135  K 3.4* 4.0  CL 101 106  CO2 23 21*  GLUCOSE 114* 90  BUN 9 8  CREATININE 0.61 0.34*  CALCIUM 9.2 8.2*   GFR: Estimated Creatinine Clearance: 96.4 mL/min (A) (by C-G formula based on SCr of 0.34 mg/dL (L)). Liver Function Tests: Recent Labs  Lab  01/01/18 1836  AST 21  ALT 19  ALKPHOS 71  BILITOT 0.7  PROT 6.6  ALBUMIN 3.6   No results for input(s): LIPASE, AMYLASE in the last 168 hours. No results for input(s): AMMONIA in the last 168 hours. Coagulation Profile: Recent Labs  Lab 01/01/18 2110  INR 1.12   Cardiac Enzymes: No results for input(s): CKTOTAL,  CKMB, CKMBINDEX, TROPONINI in the last 168 hours. BNP (last 3 results) No results for input(s): PROBNP in the last 8760 hours. HbA1C: No results for input(s): HGBA1C in the last 72 hours. CBG: No results for input(s): GLUCAP in the last 168 hours. Lipid Profile: No results for input(s): CHOL, HDL, LDLCALC, TRIG, CHOLHDL, LDLDIRECT in the last 72 hours. Thyroid Function Tests: No results for input(s): TSH, T4TOTAL, FREET4, T3FREE, THYROIDAB in the last 72 hours. Anemia Panel: No results for input(s): VITAMINB12, FOLATE, FERRITIN, TIBC, IRON, RETICCTPCT in the last 72 hours. Urine analysis:    Component Value Date/Time   COLORURINE AMBER (A) 01/01/2018 2151   APPEARANCEUR CLOUDY (A) 01/01/2018 2151   LABSPEC 1.012 01/01/2018 2151   PHURINE 7.0 01/01/2018 2151   GLUCOSEU NEGATIVE 01/01/2018 2151   HGBUR LARGE (A) 01/01/2018 2151   BILIRUBINUR NEGATIVE 01/01/2018 2151   [place] NEGATIVE 01/01/2018 2151   PROTEINUR 30 (A) 01/01/2018 2151   UROBILINOGEN 2.0 (H) 10/29/2014 1627   NITRITE NEGATIVE 01/01/2018 2151   LEUKOCYTESUR LARGE (A) 01/01/2018 2151   Sepsis Labs: @LABRCNTIP (procalcitonin:4,lacticidven:4)  ) Recent Results (from the past 240 hour(s))  Culture, blood (Routine x 2)     Status: None (Preliminary result)   Collection Time: 01/01/18  6:36 PM  Result Value Ref Range Status   Specimen Description BLOOD RIGHT ARM  Final   Special Requests   Final    BOTTLES DRAWN AEROBIC ONLY Blood Culture results may not be optimal due to an inadequate volume of blood received in culture bottles   Culture   Final    NO GROWTH < 12 HOURS Performed at [facility] [address] [place], [place] [address]    Report Status PENDING  Incomplete  Culture, blood (Routine x 2)     Status: None (Preliminary result)   Collection Time: 01/01/18  9:10 PM  Result Value Ref Range Status   Specimen Description BLOOD RIGHT ANTECUBITAL  Final   Special Requests   Final    BOTTLES  DRAWN AEROBIC AND ANAEROBIC Blood Culture adequate volume   Culture   Final    NO GROWTH < 12 HOURS Performed at [facility], [place] [address], [place], [place] [address]    Report Status PENDING  Incomplete      Studies: Dg Chest 2 View  Result Date: 01/01/2018 CLINICAL DATA:  Fever, feeling unwell.  History of paraplegia. EXAM: CHEST - 2 VIEW COMPARISON:  Chest radiograph April 24, 2017 FINDINGS: Cardiomediastinal silhouette is normal. No pleural effusions or focal consolidations. Trachea projects midline and there is no pneumothorax. Soft tissue planes and included osseous structures are non-suspicious. Interval removal of LEFT PICC. Thoracolumbar dextroscoliosis may be positional. IMPRESSION: Negative. Electronically Signed   By: Elon Alas M.D.   On: 01/01/2018 19:35    Scheduled Meds: . baclofen  20 mg Oral TID  . diazepam  10 mg Oral QHS  . diazepam  5 mg Oral BID WC  . enoxaparin (LOVENOX) injection  40 mg Subcutaneous Q24H  . gabapentin  800 mg Oral TID  . lamoTRIgine  50 mg Oral Daily  .  pantoprazole  80 mg Oral Daily  . sertraline  100 mg Oral BID    Continuous Infusions: . sodium chloride 10 mL/hr at 01/02/18 1100  . cefTRIAXone (ROCEPHIN)  IV 1 g (01/02/18 0710)     LOS: 0 days     Alma Friendly, MD [facility]   If 7PM-7AM, please contact night-coverage [email or web address] 01/02/2018, 11:46 AM

## 2018-01-02 NOTE — Plan of Care (Signed)
  Problem: Education: Goal: Knowledge of General Education information will improve Description Including pain rating scale, medication(s)/side effects and non-pharmacologic comfort measures Outcome: Progressing   Problem: Clinical Measurements: Goal: Diagnostic test results will improve Outcome: Progressing Goal: Respiratory complications will improve Outcome: Not Applicable

## 2018-01-02 NOTE — Plan of Care (Signed)
  Problem: Education: Goal: Knowledge of General Education information will improve Description Including pain rating scale, medication(s)/side effects and non-pharmacologic comfort measures Outcome: Progressing   

## 2018-01-02 NOTE — Progress Notes (Signed)
Distal pedal pulse difficult to palpate. Used doppler to find bilateral pedal pulse and marked with skin marked. Then palpated weak pulse to both feet.

## 2018-01-03 ENCOUNTER — Encounter (HOSPITAL_COMMUNITY): Payer: Self-pay | Admitting: *Deleted

## 2018-01-03 LAB — URINE CULTURE
Culture: NO GROWTH
SPECIAL REQUESTS: NORMAL

## 2018-01-03 LAB — CBC WITH DIFFERENTIAL/PLATELET
ABS IMMATURE GRANULOCYTES: 0 10*3/uL (ref 0.0–0.1)
BASOS ABS: 0 10*3/uL (ref 0.0–0.1)
BASOS PCT: 0 %
EOS ABS: 0.1 10*3/uL (ref 0.0–0.7)
EOS PCT: 3 %
HCT: 40.9 % (ref 36.0–46.0)
Hemoglobin: 13.1 g/dL (ref 12.0–15.0)
Immature Granulocytes: 0 %
Lymphocytes Relative: 25 %
Lymphs Abs: 1.2 10*3/uL (ref 0.7–4.0)
MCH: 27.5 pg (ref 26.0–34.0)
MCHC: 32 g/dL (ref 30.0–36.0)
MCV: 85.9 fL (ref 78.0–100.0)
MONO ABS: 0.3 10*3/uL (ref 0.1–1.0)
MONOS PCT: 7 %
Neutro Abs: 2.9 10*3/uL (ref 1.7–7.7)
Neutrophils Relative %: 65 %
PLATELETS: 137 10*3/uL — AB (ref 150–400)
RBC: 4.76 MIL/uL (ref 3.87–5.11)
RDW: 15.1 % (ref 11.5–15.5)
WBC: 4.5 10*3/uL (ref 4.0–10.5)

## 2018-01-03 LAB — BASIC METABOLIC PANEL
Anion gap: 15 (ref 5–15)
BUN: 6 mg/dL (ref 6–20)
CALCIUM: 9 mg/dL (ref 8.9–10.3)
CO2: 24 mmol/L (ref 22–32)
CREATININE: 0.43 mg/dL — AB (ref 0.44–1.00)
Chloride: 102 mmol/L (ref 98–111)
Glucose, Bld: 88 mg/dL (ref 70–99)
Potassium: 3.6 mmol/L (ref 3.5–5.1)
SODIUM: 141 mmol/L (ref 135–145)

## 2018-01-03 NOTE — Progress Notes (Signed)
PROGRESS NOTE  [name]:[id number] DOB: 10/08/1980 DOA: 01/01/2018 PCP: Lennie Odor, PA-C  HPI/Recap of past 24 hours: HPI from Dr Posey Pronto [name] is a 37 y.o. female with medical history significant of MS, transverse myelitis, paraplegia, hx of multiple UTIs s/p diverting urostomy, and sacral osteomyelitis who presents with two days of fevers and generalized weakness. Reported temperature up to 102-103F at her facility. She was given a shot of IM Rocephin one day ago, but sent to the ED given continued fevers. Patient is unable to sense dysuria due to her lack of sensation, but reports similar symptoms of fevers and generalized malaise with prior UTIs, last about 1 year ago. She reports significant improvement in her sacral wound. In the ED, lactic 2.41, urinalysis large leukocytes/negative nitrites. CXR with no acute cardiopulmonary disease. Pt admitted for further management.  Today, pt reported feeling better overall, no new complaints. Mother at bedside.  Assessment/Plan: Principal Problem:   UTI (urinary tract infection) Active Problems:   Paraplegia (HCC)   Hypotension due to hypovolemia  Sepsis likely 2/2 UTI Subjective fever, hypotension, lactic acidosis on presentation Currently afebrile, no leukocytosis BC X 2 NGTD, one aerobic bottle grew gram -coccobacilli, repeat pending Lactic acid 2.41--> 0.7 status post IV fluids UA with large leukocytes, wbc clumps, 21-50 WBC, large hgb (on her period) UC no growth CXR unremarkable Continue IV Rocephin for now  History of sacral osteomyelitis Sacral wound appears well-healed If fever persists, despite treatment, will investigate further  History of MS/paraplegia/transverse myelitis Continue home Valium, baclofen, gabapentin, lamotrigine    Code Status: Full  Family Communication: Mother at bedside  Disposition Plan: Back to nursing home  facility   Consultants:  None  Procedures:  None  Antimicrobials:  Rocephin  DVT prophylaxis: Lovenox   Objective: Vitals:   01/02/18 1345 01/02/18 2052 01/03/18 0945 01/03/18 1413  BP: 114/74 106/66 (!) 107/56 (!) 104/59  Pulse: 86 77 76 76  Resp: 18 18    Temp: 98.5 F (36.9 C) 98.5 F (36.9 C) 97.7 F (36.5 C) 97.7 F (36.5 C)  TempSrc: Oral Oral Oral Oral  SpO2: 99% 99% 100% 100%  Weight:      Height:        Intake/Output Summary (Last 24 hours) at 01/03/2018 1550 Last data filed at 01/02/2018 2111 Gross per 24 hour  Intake 240 ml  Output 450 ml  Net -210 ml   Filed Weights   01/01/18 1846 01/02/18 0205 01/02/18 1135  Weight: 74.4 kg 78.3 kg 78.3 kg    Exam:   General: NAD, poor dentition  Cardiovascular: S1, S2 present  Respiratory: CTA B  Abdomen: Soft, nontender, nondistended, bowel sounds present, urostomy bag in place  Musculoskeletal: No pedal edema bilaterally, contractures of both lower extremity  Skin: Sacral wound appears to be well-healed, no redness or discharge noted  Psychiatry: Normal mood   Data Reviewed: CBC: Recent Labs  Lab 01/01/18 1836 01/02/18 1055 01/03/18 0528  WBC 8.0 5.6 4.5  NEUTROABS 6.0  --  2.9  HGB 15.0 13.5 13.1  HCT 48.0* 42.0 40.9  MCV 86.6 84.2 85.9  PLT PLATELET CLUMPS NOTED ON SMEAR, COUNT APPEARS ADEQUATE 154 426*   Basic Metabolic Panel: Recent Labs  Lab 01/01/18 1836 01/02/18 0533 01/03/18 0528  NA 138 135 141  K 3.4* 4.0 3.6  CL 101 106 102  CO2 23 21* 24  GLUCOSE 114* 90 88  BUN 9 8 6   CREATININE 0.61 0.34* 0.43*  CALCIUM 9.2 8.2* 9.0   GFR: Estimated Creatinine Clearance: 96.4 mL/min (A) (by C-G formula based on SCr of 0.43 mg/dL (L)). Liver Function Tests: Recent Labs  Lab 01/01/18 1836  AST 21  ALT 19  ALKPHOS 71  BILITOT 0.7  PROT 6.6  ALBUMIN 3.6   No results for input(s): LIPASE, AMYLASE in the last 168 hours. No results for input(s): AMMONIA in the last 168  hours. Coagulation Profile: Recent Labs  Lab 01/01/18 2110  INR 1.12   Cardiac Enzymes: No results for input(s): CKTOTAL, CKMB, CKMBINDEX, TROPONINI in the last 168 hours. BNP (last 3 results) No results for input(s): PROBNP in the last 8760 hours. HbA1C: No results for input(s): HGBA1C in the last 72 hours. CBG: No results for input(s): GLUCAP in the last 168 hours. Lipid Profile: No results for input(s): CHOL, HDL, LDLCALC, TRIG, CHOLHDL, LDLDIRECT in the last 72 hours. Thyroid Function Tests: No results for input(s): TSH, T4TOTAL, FREET4, T3FREE, THYROIDAB in the last 72 hours. Anemia Panel: No results for input(s): VITAMINB12, FOLATE, FERRITIN, TIBC, IRON, RETICCTPCT in the last 72 hours. Urine analysis:    Component Value Date/Time   COLORURINE AMBER (A) 01/01/2018 2151   APPEARANCEUR CLOUDY (A) 01/01/2018 2151   LABSPEC 1.012 01/01/2018 2151   PHURINE 7.0 01/01/2018 2151   GLUCOSEU NEGATIVE 01/01/2018 2151   HGBUR LARGE (A) 01/01/2018 2151   BILIRUBINUR NEGATIVE 01/01/2018 2151   [place] NEGATIVE 01/01/2018 2151   PROTEINUR 30 (A) 01/01/2018 2151   UROBILINOGEN 2.0 (H) 10/29/2014 1627   NITRITE NEGATIVE 01/01/2018 2151   LEUKOCYTESUR LARGE (A) 01/01/2018 2151   Sepsis Labs: @LABRCNTIP (procalcitonin:4,lacticidven:4)  ) Recent Results (from the past 240 hour(s))  Culture, blood (Routine x 2)     Status: None (Preliminary result)   Collection Time: 01/01/18  6:36 PM  Result Value Ref Range Status   Specimen Description BLOOD RIGHT ARM  Final   Special Requests   Final    BOTTLES DRAWN AEROBIC ONLY Blood Culture results may not be optimal due to an inadequate volume of blood received in culture bottles   Culture   Final    NO GROWTH 2 DAYS Performed at [facility], [place] [address] [place], [place] [address]    Report Status PENDING  Incomplete  Culture, blood (Routine x 2)     Status: None (Preliminary result)   Collection Time: 01/01/18  9:10 PM   Result Value Ref Range Status   Specimen Description BLOOD RIGHT ANTECUBITAL  Final   Special Requests   Final    BOTTLES DRAWN AEROBIC AND ANAEROBIC Blood Culture adequate volume   Culture  Setup Time   Final    GRAM NEGATIVE COCCOBACILLI AEROBIC BOTTLE ONLY CRITICAL RESULT CALLED TO, READ BACK BY AND VERIFIED WITH: Rogers Blocker [id number] Performed at [facility], [place] [address], [place], [place] [address]    Culture GRAM NEGATIVE COCCOBACILLI  Final   Report Status PENDING  Incomplete  Blood Culture ID Panel (Reflexed)     Status: None   Collection Time: 01/01/18  9:10 PM  Result Value Ref Range Status   Enterococcus species NOT DETECTED NOT DETECTED Final   Listeria monocytogenes NOT DETECTED NOT DETECTED Final   Staphylococcus species NOT DETECTED NOT DETECTED Final   Staphylococcus aureus (BCID) NOT DETECTED NOT DETECTED Final   Streptococcus species NOT DETECTED NOT DETECTED Final   Streptococcus agalactiae NOT DETECTED NOT DETECTED Final   Streptococcus pneumoniae NOT DETECTED NOT  DETECTED Final   Streptococcus pyogenes NOT DETECTED NOT DETECTED Final   Acinetobacter baumannii NOT DETECTED NOT DETECTED Final   Enterobacteriaceae species NOT DETECTED NOT DETECTED Final   Enterobacter cloacae complex NOT DETECTED NOT DETECTED Final   Escherichia coli NOT DETECTED NOT DETECTED Final   Klebsiella oxytoca NOT DETECTED NOT DETECTED Final   Klebsiella pneumoniae NOT DETECTED NOT DETECTED Final   Proteus species NOT DETECTED NOT DETECTED Final   Serratia marcescens NOT DETECTED NOT DETECTED Final   Haemophilus influenzae NOT DETECTED NOT DETECTED Final   Neisseria meningitidis NOT DETECTED NOT DETECTED Final   Pseudomonas aeruginosa NOT DETECTED NOT DETECTED Final   Candida albicans NOT DETECTED NOT DETECTED Final   Candida glabrata NOT DETECTED NOT DETECTED Final   Candida krusei NOT DETECTED NOT DETECTED Final   Candida parapsilosis NOT DETECTED NOT DETECTED  Final   Candida tropicalis NOT DETECTED NOT DETECTED Final    Comment: Performed at [facility], [place] [address], [place], [place] [address]  Urine culture     Status: None   Collection Time: 01/01/18  9:51 PM  Result Value Ref Range Status   Specimen Description URINE, RANDOM  Final   Special Requests Normal  Final   Culture   Final    NO GROWTH Performed at [facility], [place] [address], [place], [place] [address]    Report Status 01/03/2018 FINAL  Final  Culture, blood (routine x 2)     Status: None (Preliminary result)   Collection Time: 01/03/18  8:00 AM  Result Value Ref Range Status   Specimen Description BLOOD BLOOD RIGHT HAND  Final   Special Requests   Final    BOTTLES DRAWN AEROBIC ONLY Blood Culture adequate volume   Culture   Final    NO GROWTH < 12 HOURS Performed at [facility], [place] [address], [place], [place] [address]    Report Status PENDING  Incomplete      Studies: No results found.  Scheduled Meds: . baclofen  20 mg Oral TID  . diazepam  10 mg Oral QHS  . diazepam  5 mg Oral BID WC  . enoxaparin (LOVENOX) injection  40 mg Subcutaneous Q24H  . gabapentin  800 mg Oral TID  . lamoTRIgine  50 mg Oral Daily  . pantoprazole  80 mg Oral Daily  . sertraline  100 mg Oral BID    Continuous Infusions: . sodium chloride 10 mL/hr at 01/02/18 1100  . cefTRIAXone (ROCEPHIN)  IV 1 g (01/03/18 0706)     LOS: 1 day     Alma Friendly, MD [facility]   If 7PM-7AM, please contact night-coverage [email or web address] 01/03/2018, 3:50 PM

## 2018-01-04 DIAGNOSIS — R7881 Bacteremia: Secondary | ICD-10-CM

## 2018-01-04 LAB — CULTURE, BLOOD (ROUTINE X 2): Special Requests: ADEQUATE

## 2018-01-04 MED ORDER — SULFAMETHOXAZOLE-TRIMETHOPRIM 800-160 MG PO TABS
1.0000 | ORAL_TABLET | Freq: Two times a day (BID) | ORAL | Status: DC
  Administered 2018-01-04: 1 via ORAL
  Filled 2018-01-04: qty 1

## 2018-01-04 MED ORDER — SULFAMETHOXAZOLE-TRIMETHOPRIM 800-160 MG PO TABS: 1.0000 | ORAL_TABLET | Freq: Two times a day (BID) | ORAL | 0 refills | Status: AC

## 2018-01-04 MED ORDER — AMOXICILLIN-POT CLAVULANATE 875-125 MG PO TABS: 1.0000 | ORAL_TABLET | Freq: Two times a day (BID) | ORAL | Status: DC

## 2018-01-04 NOTE — Progress Notes (Signed)
Discharged pt to [facility] via [facility]. A copy of discharge instructions was given to pt's mother. Report was given to [facility] at [facility].

## 2018-01-04 NOTE — Clinical Social Work Placement (Signed)
   CLINICAL SOCIAL WORK PLACEMENT  NOTE  Date:  01/04/2018  Patient Details  Name: [name] MRN: [id number] Date of Birth: [birth year]  Clinical Social Work is seeking post-discharge placement for this patient at the [place] level of care (*CSW will initial, date and re-position this form in  chart as items are completed):      Patient/family provided with [facility] Work Department's list of facilities offering this level of care within the geographic area requested by the patient (or if unable, by the patient's family).  Yes   Patient/family informed of their freedom to choose among providers that offer the needed level of care, that participate in Medicare, Medicaid or managed care program needed by the patient, have an available bed and are willing to accept the patient.      Patient/family informed of [facility]'s ownership interest in [facility] and [facility], as well as of the fact that they are under no obligation to receive care at these facilities.  PASRR submitted to EDS on       PASRR number received on       Existing PASRR number confirmed on       FL2 transmitted to all facilities in geographic area requested by pt/family on       FL2 transmitted to all facilities within larger geographic area on       Patient informed that his/her managed care company has contracts with or will negotiate with certain facilities, including the following:        Yes   Patient/family informed of bed offers received.  Patient chooses bed at [facility]     Physician recommends and patient chooses bed at      Patient to be transferred to [facility] on 01/04/18.  Patient to be transferred to facility by [facility]     Patient family notified on 01/04/18 of transfer.  Name of family member notified:  mother at bedside     PHYSICIAN       Additional Comment:    _______________________________________________ Eileen Stanford,  LCSW 01/04/2018, 3:17 PM

## 2018-01-04 NOTE — Clinical Social Work Note (Signed)
Clinical Social Worker facilitated patient discharge including contacting patient family and facility to confirm patient discharge plans.  Clinical information faxed to facility and family agreeable with plan.  CSW arranged ambulance transport via [facility] to [facility].  RN to call [phone] for report prior to discharge.  Clinical Social Worker will sign off for now as social work intervention is no longer needed. Please consult us again if new need arises.  Arleta Ostrum, LCSWA [phone]  

## 2018-01-04 NOTE — Discharge Summary (Signed)
Discharge Summary  [name] SWF:[id number] DOB: October 20, 1980  PCP: Veronica Odor, PA-C  Admit date: 01/01/2018 Discharge date: 01/04/2018  Time spent: 35 mins   Recommendations for Outpatient Follow-up:  1. PCP 2. Please ensure to change patients urostomy bag every 7 days to avoid sepsis 3. Please give patient her flu shot after completion of her antibiotic regimen. None was given here in the hospital  Discharge Diagnoses:  Active Hospital Problems   Diagnosis Date Noted  . UTI (urinary tract infection) 01/01/2018  . Hypotension due to hypovolemia   . Paraplegia [facility])     Resolved Hospital Problems  No resolved problems to display.    Discharge Condition: Stable  Diet recommendation: Heart healthy  Vitals:   01/03/18 2139 01/04/18 0449  BP: (!) 93/58 (!) 94/57  Pulse: 77 82  Resp: 18   Temp: 97.6 F (36.4 C) 98.3 F (36.8 C)  SpO2: 100% 94%    History of present illness:  Veronica E Myersis a 37 y.o.femalewith medical history significant ofMS, transverse myelitis, paraplegia, hx of multiple UTIs s/p diverting urostomy, and sacral osteomyelitis who presents with two days of fevers and generalized weakness. Reported temperature up to 102-103F at her facility. She was given a shot of IM Rocephin one day ago, but sent to the ED given continued fevers. Patient is unable to sense dysuria due to her lack of sensation, but reports similar symptoms of fevers and generalized malaise with prior UTIs, last about 1 year ago. She reports significant improvement in her sacral wound. In the ED, lactic 2.41, urinalysis large leukocytes/negative nitrites. CXR with no acute cardiopulmonary disease. Pt admitted for further management.   Today, pt reported feeling better overall, no new complaints. Mother at bedside. Please ensure to change patients urostomy bag every 7 days to avoid sepsis. Please give patient her flu shot after completion of her antibiotic regimen. None was given  here in the hospital.  Hospital Course:  Principal Problem:   UTI (urinary tract infection) Active Problems:   Paraplegia ([place])   Hypotension due to hypovolemia  Sepsis likely 2/2 Acinetobacter Lwoffii bacteremia Vs UTI Subjective fever, hypotension, lactic acidosis on presentation Currently afebrile, no leukocytosis BC X 2 NGTD, one aerobic bottle out of 4 grew Acinetobacter Lwoffii, repeat NGTD Lactic acid 2.41--> 0.7 status post IV fluids UA with large leukocytes, wbc clumps, 21-50 WBC, large hgb (on her period) UC no growth CXR unremarkable Discussed with Infectious disease Dr [name], S/P IV Rocephin, will switch to PO bactrim for a total of 7 days of antibiotics, end date is 01/08/18 Please ensure to change patients urostomy bag every 7 days to avoid sepsis  History of sacral osteomyelitis Sacral wound appears well-healed  History of MS/paraplegia/transverse myelitis Continue home Valium, baclofen, gabapentin, lamotrigine   Procedures:  None  Consultations:  Spoke to ID on 01/04/18  Discharge Exam: BP (!) 94/57 (BP Location: Right Arm)   Pulse 82   Temp 98.3 F (36.8 C) (Oral)   Resp 18   Ht 5\' 3"  (1.6 m)   Wt 78.3 kg   LMP 01/01/2018 (Exact Date)   SpO2 94%   BMI 30.58 kg/m   General: NAD Cardiovascular: S1, S2 present Respiratory: CTAB  Discharge Instructions You were cared for by a hospitalist during your hospital stay. If you have any questions about your discharge medications or the care you received while you were in the hospital after you are discharged, you can call the unit and asked to speak with the  hospitalist on call if the hospitalist that took care of you is not available. Once you are discharged, your primary care physician will handle any further medical issues. Please note that NO REFILLS for any discharge medications will be authorized once you are discharged, as it is imperative that you return to your primary care physician (or establish  a relationship with a primary care physician if you do not have one) for your aftercare needs so that they can reassess your need for medications and monitor your lab values.  Discharge Instructions    Diet - low sodium heart healthy   Complete by:  As directed    Increase activity slowly   Complete by:  As directed      Allergies as of 01/04/2018      Reactions   Iodine Anaphylaxis   Latex Anaphylaxis   Polymyxin B Hives   Pt had hives in right arm with infusion of polymyxin on November 26th, 2018   Shellfish-derived Products Anaphylaxis   Reaction unknown   Vancomycin Anaphylaxis   Aspirin Other (See Comments)   Reaction unknown   Strawberry Extract Hives, Rash      Medication List    STOP taking these medications   ferrous sulfate 325 (65 FE) MG tablet   fluconazole 100 MG tablet Commonly known as:  DIFLUCAN   nystatin cream Commonly known as:  MYCOSTATIN   Rivaroxaban 15 & 20 MG Tbpk     TAKE these medications   acetaminophen 325 MG tablet Commonly known as:  TYLENOL Take 650 mg by mouth every 6 (six) hours as needed for fever.   baclofen 20 MG tablet Commonly known as:  LIORESAL Take 20 mg by mouth 3 (three) times daily.   bisacodyl 10 MG suppository Commonly known as:  DULCOLAX Place 1 suppository (10 mg total) daily rectally. What changed:    when to take this  reasons to take this   diazepam 5 MG tablet Commonly known as:  VALIUM Take 1-2 tablets (5-10 mg total) by mouth 3 (three) times daily. Take 1 tablet in AM, 1 tablet in PM, and 2 tablets at bedtime   diphenhydrAMINE 25 mg capsule Commonly known as:  BENADRYL Take 1 capsule (25 mg total) by mouth every 8 (eight) hours as needed for itching. What changed:  how much to take   fluticasone 50 MCG/ACT nasal spray Commonly known as:  FLONASE Place 2 sprays into the nose daily as needed for allergies.   gabapentin 400 MG capsule Commonly known as:  NEURONTIN Take 2 capsules (800 mg total) by  mouth 3 (three) times daily.   ibuprofen 200 MG tablet Commonly known as:  ADVIL,MOTRIN Take 200-400 mg by mouth every 6 (six) hours as needed for headache or moderate pain.   lamoTRIgine 25 MG tablet Commonly known as:  LAMICTAL Take 2 tablets (50 mg total) by mouth daily.   loperamide 2 MG capsule Commonly known as:  IMODIUM Take 2 capsules (4 mg total) by mouth 3 (three) times daily as needed for diarrhea or loose stools.   loratadine 5 MG/5ML syrup Commonly known as:  CLARITIN Take 5 mg daily as needed by mouth for allergies.   omeprazole 40 MG capsule Commonly known as:  PRILOSEC Take 40 mg by mouth daily.   ondansetron 4 MG disintegrating tablet Commonly known as:  ZOFRAN-ODT Take 1 tablet (4 mg total) by mouth every 8 (eight) hours as needed for nausea or vomiting. What changed:  when to take this  sertraline 100 MG tablet Commonly known as:  ZOLOFT Take 1 tablet (100 mg total) by mouth 2 (two) times daily.   sulfamethoxazole-trimethoprim 800-160 MG tablet Commonly known as:  BACTRIM DS,SEPTRA DS Take 1 tablet by mouth every 12 (twelve) hours for 9 doses.      Allergies  Allergen Reactions  . Iodine Anaphylaxis  . Latex Anaphylaxis  . Polymyxin B Hives    Pt had hives in right arm with infusion of polymyxin on November 26th, 2018  . Shellfish-Derived Products Anaphylaxis    Reaction unknown  . Vancomycin Anaphylaxis  . Aspirin Other (See Comments)    Reaction unknown  . Strawberry Extract Hives and Rash    Contact information for follow-up providers    Redmon, Noelle, PA-C. Schedule an appointment as soon as possible for a visit in 1 week(s).   Specialty:  Nurse Practitioner Contact information: [address] [place] [address] [phone]            Contact information for after-discharge care    [facility] .   Service:  Skilled Nursing Contact information: [place]  [place] [phone]                   The results of significant diagnostics from this hospitalization (including imaging, microbiology, ancillary and laboratory) are listed below for reference.    Significant Diagnostic Studies: Dg Chest 2 View  Result Date: 01/01/2018 CLINICAL DATA:  Fever, feeling unwell.  History of paraplegia. EXAM: CHEST - 2 VIEW COMPARISON:  Chest radiograph April 24, 2017 FINDINGS: Cardiomediastinal silhouette is normal. No pleural effusions or focal consolidations. Trachea projects midline and there is no pneumothorax. Soft tissue planes and included osseous structures are non-suspicious. Interval removal of LEFT PICC. Thoracolumbar dextroscoliosis may be positional. IMPRESSION: Negative. Electronically Signed   By: Elon Alas M.D.   On: 01/01/2018 19:35    Microbiology: Recent Results (from the past 240 hour(s))  Culture, blood (Routine x 2)     Status: None (Preliminary result)   Collection Time: 01/01/18  6:36 PM  Result Value Ref Range Status   Specimen Description BLOOD RIGHT ARM  Final   Special Requests   Final    BOTTLES DRAWN AEROBIC ONLY Blood Culture results may not be optimal due to an inadequate volume of blood received in culture bottles   Culture   Final    NO GROWTH 2 DAYS Performed at [facility], [place] [address], [place], [place] [address]    Report Status PENDING  Incomplete  Culture, blood (Routine x 2)     Status: Abnormal   Collection Time: 01/01/18  9:10 PM  Result Value Ref Range Status   Specimen Description BLOOD RIGHT ANTECUBITAL  Final   Special Requests   Final    BOTTLES DRAWN AEROBIC AND ANAEROBIC Blood Culture adequate volume   Culture  Setup Time   Final    GRAM NEGATIVE COCCOBACILLI AEROBIC BOTTLE ONLY CRITICAL RESULT CALLED TO, READ BACK BY AND VERIFIED WITH: Rogers Blocker [id number] Performed at [facility], [place] [address] [place], [place] [address]    Culture  ACINETOBACTER LWOFFII (A)  Final   Report Status 01/04/2018 FINAL  Final   Organism ID, Bacteria ACINETOBACTER LWOFFII  Final      Susceptibility   Acinetobacter lwoffii - MIC*    CEFTAZIDIME 4 SENSITIVE Sensitive     CEFTRIAXONE  32 INTERMEDIATE Intermediate     CIPROFLOXACIN 0.5 SENSITIVE Sensitive     GENTAMICIN <=1 SENSITIVE Sensitive     IMIPENEM <=0.25 SENSITIVE Sensitive     PIP/TAZO 16 SENSITIVE Sensitive     TRIMETH/SULFA <=20 SENSITIVE Sensitive     CEFEPIME <=1 SENSITIVE Sensitive     AMPICILLIN/SULBACTAM <=2 SENSITIVE Sensitive     * ACINETOBACTER LWOFFII  Blood Culture ID Panel (Reflexed)     Status: None   Collection Time: 01/01/18  9:10 PM  Result Value Ref Range Status   Enterococcus species NOT DETECTED NOT DETECTED Final   Listeria monocytogenes NOT DETECTED NOT DETECTED Final   Staphylococcus species NOT DETECTED NOT DETECTED Final   Staphylococcus aureus (BCID) NOT DETECTED NOT DETECTED Final   Streptococcus species NOT DETECTED NOT DETECTED Final   Streptococcus agalactiae NOT DETECTED NOT DETECTED Final   Streptococcus pneumoniae NOT DETECTED NOT DETECTED Final   Streptococcus pyogenes NOT DETECTED NOT DETECTED Final   Acinetobacter baumannii NOT DETECTED NOT DETECTED Final   Enterobacteriaceae species NOT DETECTED NOT DETECTED Final   Enterobacter cloacae complex NOT DETECTED NOT DETECTED Final   Escherichia coli NOT DETECTED NOT DETECTED Final   Klebsiella oxytoca NOT DETECTED NOT DETECTED Final   Klebsiella pneumoniae NOT DETECTED NOT DETECTED Final   Proteus species NOT DETECTED NOT DETECTED Final   Serratia marcescens NOT DETECTED NOT DETECTED Final   Haemophilus influenzae NOT DETECTED NOT DETECTED Final   Neisseria meningitidis NOT DETECTED NOT DETECTED Final   Pseudomonas aeruginosa NOT DETECTED NOT DETECTED Final   Candida albicans NOT DETECTED NOT DETECTED Final   Candida glabrata NOT DETECTED NOT DETECTED Final   Candida krusei NOT DETECTED NOT  DETECTED Final   Candida parapsilosis NOT DETECTED NOT DETECTED Final   Candida tropicalis NOT DETECTED NOT DETECTED Final    Comment: Performed at [facility], [address], [place], [place] [address]  Urine culture     Status: None   Collection Time: 01/01/18  9:51 PM  Result Value Ref Range Status   Specimen Description URINE, RANDOM  Final   Special Requests Normal  Final   Culture   Final    NO GROWTH Performed at [facility], [place] [address] [place], [place] [address]    Report Status 01/03/2018 FINAL  Final  Culture, blood (routine x 2)     Status: None (Preliminary result)   Collection Time: 01/03/18  8:00 AM  Result Value Ref Range Status   Specimen Description BLOOD BLOOD RIGHT HAND  Final   Special Requests   Final    BOTTLES DRAWN AEROBIC ONLY Blood Culture adequate volume   Culture   Final    NO GROWTH < 12 HOURS Performed at [facility], [place] [address], [place],  [address]    Report Status PENDING  Incomplete     Labs: Basic Metabolic Panel: Recent Labs  Lab 01/01/18 1836 01/02/18 0533 01/03/18 0528  NA 138 135 141  K 3.4* 4.0 3.6  CL 101 106 102  CO2 23 21* 24  GLUCOSE 114* 90 88  BUN 9 8 6   CREATININE 0.61 0.34* 0.43*  CALCIUM 9.2 8.2* 9.0   Liver Function Tests: Recent Labs  Lab 01/01/18 1836  AST 21  ALT 19  ALKPHOS 71  BILITOT 0.7  PROT 6.6  ALBUMIN 3.6   No results for input(s): LIPASE, AMYLASE in the last 168 hours. No results for input(s): AMMONIA in the last 168 hours.  CBC: Recent Labs  Lab 01/01/18 1836 01/02/18 1055 01/03/18 0528  WBC 8.0 5.6 4.5  NEUTROABS 6.0  --  2.9  HGB 15.0 13.5 13.1  HCT 48.0* 42.0 40.9  MCV 86.6 84.2 85.9  PLT PLATELET CLUMPS NOTED ON SMEAR, COUNT APPEARS ADEQUATE 154 137*   Cardiac Enzymes: No results for input(s): CKTOTAL, CKMB, CKMBINDEX, TROPONINI in the last 168 hours. BNP: BNP (last 3 results) No results for input(s): BNP in the last 8760 hours.  ProBNP  (last 3 results) No results for input(s): PROBNP in the last 8760 hours.  CBG: No results for input(s): GLUCAP in the last 168 hours.     Signed:  Alma Friendly, MD [facility] 01/04/2018, 12:41 PM

## 2018-01-06 LAB — CULTURE, BLOOD (ROUTINE X 2): Culture: NO GROWTH

## 2018-01-08 LAB — CULTURE, BLOOD (ROUTINE X 2)
CULTURE: NO GROWTH
Culture: NO GROWTH
SPECIAL REQUESTS: ADEQUATE
SPECIAL REQUESTS: ADEQUATE

## 2018-02-17 ENCOUNTER — Ambulatory Visit: Payer: Medicaid Other | Admitting: Infectious Disease

## 2018-03-07 ENCOUNTER — Emergency Department (HOSPITAL_COMMUNITY): Payer: Medicaid Other

## 2018-03-07 ENCOUNTER — Other Ambulatory Visit: Payer: Self-pay

## 2018-03-07 ENCOUNTER — Inpatient Hospital Stay (HOSPITAL_COMMUNITY)
Admission: EM | Admit: 2018-03-07 | Discharge: 2018-03-12 | DRG: 872 | Disposition: A | Discharge status: Skilled Nursing Facility | Payer: Medicaid Other | Attending: Internal Medicine | Admitting: Internal Medicine

## 2018-03-07 ENCOUNTER — Encounter (HOSPITAL_COMMUNITY): Payer: Self-pay

## 2018-03-07 DIAGNOSIS — Z888 Allergy status to other drugs, medicaments and biological substances status: Secondary | ICD-10-CM

## 2018-03-07 DIAGNOSIS — G35 Multiple sclerosis: Secondary | ICD-10-CM | POA: Diagnosis present

## 2018-03-07 DIAGNOSIS — G8220 Paraplegia, unspecified: Secondary | ICD-10-CM | POA: Diagnosis present

## 2018-03-07 DIAGNOSIS — J208 Acute bronchitis due to other specified organisms: Secondary | ICD-10-CM | POA: Diagnosis present

## 2018-03-07 DIAGNOSIS — F419 Anxiety disorder, unspecified: Secondary | ICD-10-CM | POA: Diagnosis present

## 2018-03-07 DIAGNOSIS — Z886 Allergy status to analgesic agent status: Secondary | ICD-10-CM

## 2018-03-07 DIAGNOSIS — A4189 Other specified sepsis: Principal | ICD-10-CM | POA: Diagnosis present

## 2018-03-07 DIAGNOSIS — J90 Pleural effusion, not elsewhere classified: Secondary | ICD-10-CM | POA: Diagnosis present

## 2018-03-07 DIAGNOSIS — Z9102 Food additives allergy status: Secondary | ICD-10-CM

## 2018-03-07 DIAGNOSIS — A419 Sepsis, unspecified organism: Secondary | ICD-10-CM

## 2018-03-07 DIAGNOSIS — F4310 Post-traumatic stress disorder, unspecified: Secondary | ICD-10-CM | POA: Diagnosis present

## 2018-03-07 DIAGNOSIS — M868X8 Other osteomyelitis, other site: Secondary | ICD-10-CM | POA: Diagnosis present

## 2018-03-07 DIAGNOSIS — Z1621 Resistance to vancomycin: Secondary | ICD-10-CM

## 2018-03-07 DIAGNOSIS — J101 Influenza due to other identified influenza virus with other respiratory manifestations: Secondary | ICD-10-CM

## 2018-03-07 DIAGNOSIS — Z91040 Latex allergy status: Secondary | ICD-10-CM

## 2018-03-07 DIAGNOSIS — Z86718 Personal history of other venous thrombosis and embolism: Secondary | ICD-10-CM

## 2018-03-07 DIAGNOSIS — Z91013 Allergy to seafood: Secondary | ICD-10-CM

## 2018-03-07 DIAGNOSIS — Z79899 Other long term (current) drug therapy: Secondary | ICD-10-CM

## 2018-03-07 DIAGNOSIS — M869 Osteomyelitis, unspecified: Secondary | ICD-10-CM | POA: Diagnosis present

## 2018-03-07 DIAGNOSIS — R6889 Other general symptoms and signs: Secondary | ICD-10-CM

## 2018-03-07 DIAGNOSIS — E876 Hypokalemia: Secondary | ICD-10-CM | POA: Diagnosis not present

## 2018-03-07 DIAGNOSIS — D696 Thrombocytopenia, unspecified: Secondary | ICD-10-CM | POA: Diagnosis present

## 2018-03-07 DIAGNOSIS — B9689 Other specified bacterial agents as the cause of diseases classified elsewhere: Secondary | ICD-10-CM | POA: Diagnosis present

## 2018-03-07 DIAGNOSIS — K219 Gastro-esophageal reflux disease without esophagitis: Secondary | ICD-10-CM | POA: Diagnosis present

## 2018-03-07 DIAGNOSIS — G373 Acute transverse myelitis in demyelinating disease of central nervous system: Secondary | ICD-10-CM | POA: Diagnosis present

## 2018-03-07 DIAGNOSIS — B952 Enterococcus as the cause of diseases classified elsewhere: Secondary | ICD-10-CM | POA: Diagnosis present

## 2018-03-07 DIAGNOSIS — G35D Multiple sclerosis, unspecified: Secondary | ICD-10-CM | POA: Diagnosis present

## 2018-03-07 DIAGNOSIS — N390 Urinary tract infection, site not specified: Secondary | ICD-10-CM

## 2018-03-07 DIAGNOSIS — Z881 Allergy status to other antibiotic agents status: Secondary | ICD-10-CM

## 2018-03-07 LAB — COMPREHENSIVE METABOLIC PANEL
ALT: 29 U/L (ref 0–44)
ANION GAP: 11 (ref 5–15)
AST: 37 U/L (ref 15–41)
Albumin: 3.9 g/dL (ref 3.5–5.0)
Alkaline Phosphatase: 70 U/L (ref 38–126)
BUN: 14 mg/dL (ref 6–20)
CHLORIDE: 100 mmol/L (ref 98–111)
CO2: 24 mmol/L (ref 22–32)
Calcium: 8.8 mg/dL — ABNORMAL LOW (ref 8.9–10.3)
Creatinine, Ser: 0.57 mg/dL (ref 0.44–1.00)
GFR calc Af Amer: >60 mL/min (ref >60)
GFR calc non Af Amer: >60 mL/min (ref >60)
Glucose, Bld: 118 mg/dL — ABNORMAL HIGH (ref 70–99)
POTASSIUM: 3.6 mmol/L (ref 3.5–5.1)
SODIUM: 135 mmol/L (ref 135–145)
Total Bilirubin: 0.6 mg/dL (ref 0.3–1.2)
Total Protein: 6.7 g/dL (ref 6.5–8.1)

## 2018-03-07 LAB — URINALYSIS, ROUTINE W REFLEX MICROSCOPIC
Bilirubin Urine: NEGATIVE
Glucose, UA: NEGATIVE mg/dL
Ketones, ur: NEGATIVE mg/dL
Nitrite: POSITIVE — AB
PH: 6 (ref 5.0–8.0)
Protein, ur: NEGATIVE mg/dL
Specific Gravity, Urine: 1.011 (ref 1.005–1.030)

## 2018-03-07 LAB — CBC WITH DIFFERENTIAL/PLATELET
Abs Immature Granulocytes: 0.06 10*3/uL (ref 0.00–0.07)
Basophils Absolute: 0 10*3/uL (ref 0.0–0.1)
Basophils Relative: 0 %
Eosinophils Absolute: 0 10*3/uL (ref 0.0–0.5)
Eosinophils Relative: 0 %
HEMATOCRIT: 39.4 % (ref 36.0–46.0)
HEMOGLOBIN: 12.6 g/dL (ref 12.0–15.0)
IMMATURE GRANULOCYTES: 1 %
LYMPHS ABS: 0.7 10*3/uL (ref 0.7–4.0)
LYMPHS PCT: 8 %
MCH: 27.7 pg (ref 26.0–34.0)
MCHC: 32 g/dL (ref 30.0–36.0)
MCV: 86.6 fL (ref 80.0–100.0)
Monocytes Absolute: 0.2 10*3/uL (ref 0.1–1.0)
Monocytes Relative: 2 %
NEUTROS ABS: 7.7 10*3/uL (ref 1.7–7.7)
NEUTROS PCT: 89 %
NRBC: 0 % (ref 0.0–0.2)
Platelets: 145 10*3/uL — ABNORMAL LOW (ref 150–400)
RBC: 4.55 MIL/uL (ref 3.87–5.11)
RDW: 15 % (ref 11.5–15.5)
WBC: 8.7 10*3/uL (ref 4.0–10.5)

## 2018-03-07 LAB — I-STAT CG4 LACTIC ACID, ED
LACTIC ACID, VENOUS: 2.68 mmol/L — AB (ref 0.5–1.9)
Lactic Acid, Venous: 0.53 mmol/L (ref 0.5–1.9)

## 2018-03-07 LAB — INFLUENZA PANEL BY PCR (TYPE A & B)
Influenza A By PCR: POSITIVE — AB
Influenza B By PCR: NEGATIVE

## 2018-03-07 MED ORDER — SODIUM CHLORIDE 0.9 % IV SOLN
1000.0000 mL | INTRAVENOUS | Status: DC
  Administered 2018-03-07: 1000 mL via INTRAVENOUS

## 2018-03-07 MED ORDER — SODIUM CHLORIDE 0.9 % IV BOLUS (SEPSIS)
1000.0000 mL | Freq: Once | INTRAVENOUS | Status: AC
  Administered 2018-03-07: 1000 mL via INTRAVENOUS

## 2018-03-07 MED ORDER — IPRATROPIUM-ALBUTEROL 0.5-2.5 (3) MG/3ML IN SOLN
3.0000 mL | Freq: Once | RESPIRATORY_TRACT | Status: AC
  Administered 2018-03-07: 3 mL via RESPIRATORY_TRACT
  Filled 2018-03-07: qty 3

## 2018-03-07 MED ORDER — SODIUM CHLORIDE 0.9 % IV BOLUS (SEPSIS)
500.0000 mL | Freq: Once | INTRAVENOUS | Status: AC
  Administered 2018-03-07: 500 mL via INTRAVENOUS

## 2018-03-07 MED ORDER — SODIUM CHLORIDE 0.9 % IV SOLN
2.0000 g | Freq: Once | INTRAVENOUS | Status: AC
  Administered 2018-03-07: 2 g via INTRAVENOUS
  Filled 2018-03-07: qty 2

## 2018-03-07 NOTE — ED Triage Notes (Signed)
Pt BIB EMS from [facility]. Facility called out for tachycardia and fever. Pt has a chronic cath. A&O x4.   1000mg  Tylenol  112/47 HR 120 RR 24 Temp 106 Tympanic

## 2018-03-07 NOTE — ED Notes (Signed)
Bed: WA16 Expected date:  Expected time:  Means of arrival:  Comments: EMS- UTI? 

## 2018-03-07 NOTE — Progress Notes (Signed)
A consult was received from an ED physician for cefepime per pharmacy dosing.  The patient's profile has been reviewed for ht/wt/allergies/indication/available labs.    A one time order has been placed for cefepime 2gn IV x1.  Further antibiotics/pharmacy consults should be ordered by admitting physician if indicated.                       Thank you, Lynelle Doctor 03/07/2018  5:55 PM

## 2018-03-07 NOTE — ED Provider Notes (Signed)
[facility] DEPT Provider Note   CSN: [id number] Arrival date & time: 03/07/18  1733     History   Chief Complaint No chief complaint on file.   HPI [name] is a 37 y.o. female.  The history is provided by the patient, medical records and the nursing home. No language interpreter was used.     37 year old female with history of paraplegia, transverse myelitis, recurrent urinary tract infection with indwelling Foley catheter, MS brought here via EMS from nursing facility for concern of infection.  Patient report for the past 2 days she has had fever, chills, sinus congestion, persistent cough productive with sputum and generalized weakness.  She endorsed decrease in appetite.  She has chronic catheter denies any significant abdominal or worsening back pain.  She denies confusion.  She did receive Tylenol ibuprofen at the facility.  Patient states she has had her flu shot this year however she admits that multiple staff at her facility is currently sick with flulike symptoms.  Past Medical History:  Diagnosis Date  . Bladder stones    Hx of  . Depression   . GERD (gastroesophageal reflux disease)   . Intertrigo 04/24/2017  . Left upper extremity swelling 04/24/2017  . Multiple sclerosis ([place])   . Paraplegia ([place])   . PTSD (post-traumatic stress disorder)   . Transverse myelitis ([place])   . Yeast infection 09/26/2017    Patient Active Problem List   Diagnosis Date Noted  . UTI (urinary tract infection) 01/01/2018  . Yeast infection 09/26/2017  . Intertrigo 04/24/2017  . Left upper extremity swelling 04/24/2017  . DVT (deep venous thrombosis) ([place]) 04/24/2017  . Hypotension due to hypovolemia   . Thrombocytopenia ([place])   . Acute kidney injury (nontraumatic) ([place])   . Sepsis associated hypotension ([place]) 03/26/2017  . AKI (acute kidney injury) ([place]) 03/26/2017  . Somnolence   . Facial droop   . MDR Acinetobacter baumannii infection   .  Acinetobacter lwoffi infection   . Proteus infection   . MRSA infection   . Decubitus ulcer of left ischial area, stage III ([place]) 11/15/2016  . Diarrhea 07/17/2016  . Osteomyelitis, pelvis ([place]) 02/16/2016  . Constipation 10/29/2014  . Multiple sclerosis ([place]) 10/16/2011  . Paraplegia ([place])   . History of transverse myelitis     Past Surgical History:  Procedure Laterality Date  . CHOLECYSTECTOMY    . CYSTECTOMY W/ URETEROILEAL CONDUIT    . IR FLUORO GUIDE CV LINE LEFT  02/25/2017  . IR US GUIDE VASC ACCESS LEFT  02/25/2017  . IRRIGATION AND DEBRIDEMENT ABSCESS N/A 02/18/2017   Procedure: IRRIGATION AND DEBRIDEMENT ABSCESS;  Surgeon: Excell Seltzer, MD;  Location: [facility] ORS;  Service: General;  Laterality: N/A;  . TONSILLECTOMY    . WISDOM TOOTH EXTRACTION       OB History    Gravida  0   Para  0   Term  0   Preterm  0   AB  0   Living  0     SAB  0   TAB  0   Ectopic  0   Multiple  0   Live Births  0            Home Medications    Prior to Admission medications   Medication Sig Start Date End Date Taking? Authorizing Provider  acetaminophen (TYLENOL) 325 MG tablet Take 650 mg by mouth every 6 (six) hours as needed for fever.  [provider]  baclofen (LIORESAL) 20 MG tablet Take 20 mg by mouth 3 (three) times daily.    [provider]  bisacodyl (DULCOLAX) 10 MG suppository Place 1 suppository (10 mg total) daily rectally. Patient taking differently: Place 10 mg rectally daily as needed for mild constipation.  02/15/17   Florencia Reasons, MD  diazepam (VALIUM) 5 MG tablet Take 1-2 tablets (5-10 mg total) by mouth 3 (three) times daily. Take 1 tablet in AM, 1 tablet in PM, and 2 tablets at bedtime 04/30/17   Cristal Ford, DO  diphenhydrAMINE (BENADRYL) 25 mg capsule Take 1 capsule (25 mg total) by mouth every 8 (eight) hours as needed for itching. Patient taking differently: Take 50 mg by mouth every 8 (eight) hours as needed for  itching.  03/12/17   Rosita Fire, MD  fluticasone [facility]) 50 MCG/ACT nasal spray Place 2 sprays into the nose daily as needed for allergies.     [provider]  gabapentin (NEURONTIN) 400 MG capsule Take 2 capsules (800 mg total) by mouth 3 (three) times daily. 08/13/16   Pieter Partridge, DO  ibuprofen (ADVIL,MOTRIN) 200 MG tablet Take 200-400 mg by mouth every 6 (six) hours as needed for headache or moderate pain.     [provider]  lamoTRIgine (LAMICTAL) 25 MG tablet Take 2 tablets (50 mg total) by mouth daily. 04/03/17   Bonnielee Haff, MD  loperamide (IMODIUM) 2 MG capsule Take 2 capsules (4 mg total) by mouth 3 (three) times daily as needed for diarrhea or loose stools. 04/03/17   Bonnielee Haff, MD  loratadine (CLARITIN) 5 MG/5ML syrup Take 5 mg daily as needed by mouth for allergies.     [provider]  omeprazole (PRILOSEC) 40 MG capsule Take 40 mg by mouth daily.    [provider]  ondansetron (ZOFRAN-ODT) 4 MG disintegrating tablet Take 1 tablet (4 mg total) by mouth every 8 (eight) hours as needed for nausea or vomiting. Patient taking differently: Take 4 mg by mouth every 12 (twelve) hours.  03/12/17   Rosita Fire, MD  sertraline (ZOLOFT) 100 MG tablet Take 1 tablet (100 mg total) by mouth 2 (two) times daily. 04/03/17   Bonnielee Haff, MD    Family History Family History  Problem Relation Age of [place]  . Diabetes type II Father   . CAD Father   . Dementia Father   . Stroke Father   . Seizures Father   . Diabetes type II Mother   . Hypertension Mother   . Breast cancer Other   . Diabetes type II Other     Social History Social History   Tobacco Use  . Smoking status: Never Smoker  . Smokeless tobacco: Never Used  Substance Use Topics  . Alcohol use: No  . Drug use: No     Allergies   Iodine; Latex; Polymyxin b; Shellfish-derived products; Vancomycin; Aspirin; and Strawberry extract   Review of  Systems Review of Systems  All other systems reviewed and are negative.    Physical Exam Updated Vital Signs BP (!) 99/59 (BP Location: Left Arm)   Pulse (!) 113   Temp (!) 100.7 F (38.2 C) (Oral)   Resp 20   SpO2 98%   Physical Exam  Constitutional: She is oriented to person, place, and time. She appears well-developed and well-nourished. No distress.  HENT:  Head: Atraumatic.  Mouth is dry  Eyes: Conjunctivae are normal.  Neck: Normal range of motion. Neck  supple.  No nuchal rigidity  Cardiovascular:  Tachycardia without murmur rubs or gal  Pulmonary/Chest:  Decreased breath sounds with faint crackles heard at the lung base  Abdominal: Soft. Bowel sounds are normal. She exhibits no distension. There is no tenderness.  Indwelling Foley catheter.  Genitourinary:  Genitourinary Comments: Chaperone present during exam.  Loose stool noted in adult diaper.  No evidence of active sacral wound infection.   Neurological: She is alert and oriented to person, place, and time.  Paraplegia to lower extremities with chronic muscle wasting.  Skin: No rash noted.  Psychiatric: She has a normal mood and affect.  Nursing note and vitals reviewed.    ED Treatments / Results  Labs (all labs ordered are listed, but only abnormal results are displayed) Labs Reviewed  COMPREHENSIVE METABOLIC PANEL - Abnormal; Notable for the following components:      Result Value   Glucose, Bld 118 (*)    Calcium 8.8 (*)    All other components within normal limits  CBC WITH DIFFERENTIAL/PLATELET - Abnormal; Notable for the following components:   Platelets 145 (*)    All other components within normal limits  URINALYSIS, ROUTINE W REFLEX MICROSCOPIC - Abnormal; Notable for the following components:   Color, Urine AMBER (*)    Hgb urine dipstick MODERATE (*)    Nitrite POSITIVE (*)    Leukocytes, UA LARGE (*)    Bacteria, UA MANY (*)    All other components within normal limits  I-STAT CG4  LACTIC ACID, ED - Abnormal; Notable for the following components:   Lactic Acid, Venous 2.68 (*)    All other components within normal limits  CULTURE, BLOOD (ROUTINE X 2)  CULTURE, BLOOD (ROUTINE X 2)  RESPIRATORY PANEL BY PCR  MRSA PCR SCREENING  URINE CULTURE  I-STAT CG4 LACTIC ACID, ED    EKG EKG Interpretation  Date/Time:  Friday March 07 2018 17:44:10 EST Ventricular Rate:  113 PR Interval:    QRS Duration: 84 QT Interval:  315 QTC Calculation: 432 R Axis:   61 Text Interpretation:  Sinus tachycardia Low voltage, precordial leads Since last tracing rate faster Confirmed by Daleen Bo [phone]) on 03/07/2018 6:34:26 PM   ED ECG REPORT   Date: 03/07/2018  Rate: 113  Rhythm: sinus tachycardia  QRS Axis: normal  Intervals: normal  ST/T Wave abnormalities: normal  Conduction Disutrbances:none  Narrative Interpretation:   Old EKG Reviewed: unchanged  I have personally reviewed the EKG tracing and agree with the computerized printout as noted.   Radiology Dg Chest Port 1 View  Result Date: 03/07/2018 CLINICAL DATA:  Sepsis.  Tachycardia and fever. EXAM: PORTABLE CHEST 1 VIEW COMPARISON:  01/01/2018 FINDINGS: The heart size and mediastinal contours are within normal limits. Both lungs are clear. The visualized skeletal structures are unremarkable. IMPRESSION: No active disease. Electronically Signed   By: Nelson Chimes M.D.   On: 03/07/2018 18:13    Procedures .Critical Care Performed by: Domenic Moras, PA-C Authorized by: Domenic Moras, PA-C   Critical care provider statement:    Critical care time (minutes):  45   Critical care was time spent personally by me on the following activities:  Discussions with consultants, evaluation of patient's response to treatment, examination of patient, ordering and performing treatments and interventions, ordering and review of laboratory studies, ordering and review of radiographic studies, pulse oximetry, re-evaluation of  patient's condition, obtaining history from patient or surrogate and review of old charts   (including critical care time)  Medications Ordered in ED Medications  0.9 %  sodium chloride infusion (1,000 mLs Intravenous New Bag/Given 03/07/18 1808)  ipratropium-albuterol (DUONEB) 0.5-2.5 (3) MG/3ML nebulizer solution 3 mL (has no administration in time range)  sodium chloride 0.9 % bolus 1,000 mL (0 mLs Intravenous Stopped 03/07/18 1921)    And  sodium chloride 0.9 % bolus 1,000 mL (0 mLs Intravenous Stopped 03/07/18 2028)    And  sodium chloride 0.9 % bolus 500 mL (0 mLs Intravenous Stopped 03/07/18 2000)  ceFEPIme (MAXIPIME) 2 g in sodium chloride 0.9 % 100 mL IVPB (0 g Intravenous Stopped 03/07/18 1905)     Initial Impression / Assessment and Plan / ED Course  I have reviewed the triage vital signs and the nursing notes.  Pertinent labs & imaging results that were available during my care of the patient were reviewed by me and considered in my medical decision making (see chart for details).     BP (!) 82/40   Pulse (!) 109   Temp (!) 100.7 F (38.2 C) (Oral)   Resp 20   LMP  (Exact Date)   SpO2 93%    Final Clinical Impressions(s) / ED Diagnoses   Final diagnoses:  Flu-like symptoms  Acute lower UTI  Sepsis without acute organ dysfunction, due to unspecified organism [facility])    ED Discharge Orders    None     6:30 PM This is a paraplegic patient lives at nursing facility here with fever and productive cough concerning for pneumonia or flulike illness.  She does have a chronic indwelling Foley catheter but denies any worsening lower abdominal pain although her paraplegia does affect her sensorium.  Vital signs concerning for septic as a temperature is 100.7, she is tachycardic with heart rate of 109, she is hypotensive with blood pressure 92/46, and her O2 sats are at 91% on room air.  She is mentating appropriately.  Code sepsis initiated, patient was initially given  cefepime.  She does have an allergy to vancomycin with anaphylaxis.  I did discuss antibiotic choice with ED pharmacist who recommend obtaining a MRSA PCR panel and if patient is MRSA positive, then she may benefit from Zyvox.  If she is MRSA negative, then she may not need additional coverage aside from cefepime.  Patient given appropriate fluid resuscitation at 30 mL/kg.  She did received Tylenol just prior to arrival.  10:08 PM There is a large delay in obtaining MRSA PCR as well as viral respiratory panel.  Patient is currently receiving IV fluid.  She has a total of 2 L of fluid, blood pressure remains soft however patient appears more comfortable.  She has always had low blood pressures so therefore this could be at her baseline.  At this time I do not think pressures is required.  Lung aeration is less than optimal however chest x-ray without evidence of pneumonia.  I am still concerned for potential flu as the source of infection.  Patient has received cefepime in the ED.  Will consult for admission.  10:29 PM Sepsis - Repeat Assessment  Performed at:    10:12 PM  Vitals     Blood pressure (!) 82/40, pulse (!) 109, temperature (!) 100.7 F (38.2 C), temperature source Oral, resp. rate 20, SpO2 93 %.  Heart:     Tachycardic  Lungs:    Rhonchi  Capillary Refill:   <2 sec  Peripheral Pulse:   Radial pulse palpable  Skin:     Normal Color  10:30  PM Lactic acid improved from 2.68 to 0.53 after patient receiving IV fluid. Pt given duonebs and tolerates well.    Appreciate consultation with [facility] DR. Wouk who agrees to see pt in the ER and will admit.  He request assessment of sacral wound.  I have evaluated pt and did not notice any active sacral wound at this time.  There were evidence of loose stool in adult diaper.  Our nurse will change diaper and cleanse patient.     Domenic Moras, PA-C 03/07/18 2249    Daleen Bo, MD 03/08/18 [phone]

## 2018-03-08 ENCOUNTER — Encounter (HOSPITAL_COMMUNITY): Payer: Self-pay

## 2018-03-08 ENCOUNTER — Other Ambulatory Visit: Payer: Self-pay

## 2018-03-08 DIAGNOSIS — B952 Enterococcus as the cause of diseases classified elsewhere: Secondary | ICD-10-CM | POA: Diagnosis present

## 2018-03-08 DIAGNOSIS — Z881 Allergy status to other antibiotic agents status: Secondary | ICD-10-CM | POA: Diagnosis not present

## 2018-03-08 DIAGNOSIS — F4310 Post-traumatic stress disorder, unspecified: Secondary | ICD-10-CM | POA: Diagnosis present

## 2018-03-08 DIAGNOSIS — J101 Influenza due to other identified influenza virus with other respiratory manifestations: Secondary | ICD-10-CM | POA: Diagnosis present

## 2018-03-08 DIAGNOSIS — G373 Acute transverse myelitis in demyelinating disease of central nervous system: Secondary | ICD-10-CM | POA: Diagnosis not present

## 2018-03-08 DIAGNOSIS — A4189 Other specified sepsis: Secondary | ICD-10-CM | POA: Diagnosis present

## 2018-03-08 DIAGNOSIS — K219 Gastro-esophageal reflux disease without esophagitis: Secondary | ICD-10-CM | POA: Diagnosis present

## 2018-03-08 DIAGNOSIS — G35 Multiple sclerosis: Secondary | ICD-10-CM | POA: Diagnosis present

## 2018-03-08 DIAGNOSIS — Z91013 Allergy to seafood: Secondary | ICD-10-CM | POA: Diagnosis not present

## 2018-03-08 DIAGNOSIS — E876 Hypokalemia: Secondary | ICD-10-CM | POA: Diagnosis not present

## 2018-03-08 DIAGNOSIS — J208 Acute bronchitis due to other specified organisms: Secondary | ICD-10-CM | POA: Diagnosis present

## 2018-03-08 DIAGNOSIS — Z86718 Personal history of other venous thrombosis and embolism: Secondary | ICD-10-CM | POA: Diagnosis not present

## 2018-03-08 DIAGNOSIS — B9689 Other specified bacterial agents as the cause of diseases classified elsewhere: Secondary | ICD-10-CM | POA: Diagnosis present

## 2018-03-08 DIAGNOSIS — Z79899 Other long term (current) drug therapy: Secondary | ICD-10-CM | POA: Diagnosis not present

## 2018-03-08 DIAGNOSIS — D696 Thrombocytopenia, unspecified: Secondary | ICD-10-CM

## 2018-03-08 DIAGNOSIS — M868X8 Other osteomyelitis, other site: Secondary | ICD-10-CM | POA: Diagnosis present

## 2018-03-08 DIAGNOSIS — J90 Pleural effusion, not elsewhere classified: Secondary | ICD-10-CM | POA: Diagnosis present

## 2018-03-08 DIAGNOSIS — G8220 Paraplegia, unspecified: Secondary | ICD-10-CM | POA: Diagnosis present

## 2018-03-08 DIAGNOSIS — Z9102 Food additives allergy status: Secondary | ICD-10-CM | POA: Diagnosis not present

## 2018-03-08 DIAGNOSIS — Z91040 Latex allergy status: Secondary | ICD-10-CM | POA: Diagnosis not present

## 2018-03-08 DIAGNOSIS — Z886 Allergy status to analgesic agent status: Secondary | ICD-10-CM | POA: Diagnosis not present

## 2018-03-08 DIAGNOSIS — Z888 Allergy status to other drugs, medicaments and biological substances status: Secondary | ICD-10-CM | POA: Diagnosis not present

## 2018-03-08 DIAGNOSIS — Z1621 Resistance to vancomycin: Secondary | ICD-10-CM | POA: Diagnosis not present

## 2018-03-08 DIAGNOSIS — N390 Urinary tract infection, site not specified: Secondary | ICD-10-CM | POA: Diagnosis present

## 2018-03-08 DIAGNOSIS — F419 Anxiety disorder, unspecified: Secondary | ICD-10-CM | POA: Diagnosis present

## 2018-03-08 LAB — RESPIRATORY PANEL BY PCR
Adenovirus: NOT DETECTED
Bordetella pertussis: NOT DETECTED
Chlamydophila pneumoniae: NOT DETECTED
Coronavirus 229E: NOT DETECTED
Coronavirus HKU1: NOT DETECTED
Coronavirus NL63: NOT DETECTED
Coronavirus OC43: NOT DETECTED
INFLUENZA A H1 2009-RVPPR: DETECTED — AB
Influenza B: NOT DETECTED
Metapneumovirus: NOT DETECTED
Mycoplasma pneumoniae: NOT DETECTED
Parainfluenza Virus 1: NOT DETECTED
Parainfluenza Virus 2: NOT DETECTED
Parainfluenza Virus 3: NOT DETECTED
Parainfluenza Virus 4: NOT DETECTED
RESPIRATORY SYNCYTIAL VIRUS-RVPPCR: NOT DETECTED
Rhinovirus / Enterovirus: NOT DETECTED

## 2018-03-08 LAB — MRSA PCR SCREENING: MRSA by PCR: NEGATIVE

## 2018-03-08 MED ORDER — CHLORHEXIDINE GLUCONATE 0.12 % MT SOLN
15.0000 mL | Freq: Two times a day (BID) | OROMUCOSAL | Status: DC
  Administered 2018-03-08 – 2018-03-12 (×8): 15 mL via OROMUCOSAL
  Filled 2018-03-08 (×7): qty 15

## 2018-03-08 MED ORDER — ENOXAPARIN SODIUM 40 MG/0.4ML ~~LOC~~ SOLN
40.0000 mg | Freq: Every day | SUBCUTANEOUS | Status: DC
  Administered 2018-03-08 – 2018-03-11 (×5): 40 mg via SUBCUTANEOUS
  Filled 2018-03-08 (×5): qty 0.4

## 2018-03-08 MED ORDER — ORAL CARE MOUTH RINSE
15.0000 mL | Freq: Two times a day (BID) | OROMUCOSAL | Status: DC
  Administered 2018-03-09 (×2): 15 mL via OROMUCOSAL

## 2018-03-08 MED ORDER — ACETAMINOPHEN 325 MG PO TABS
650.0000 mg | ORAL_TABLET | Freq: Four times a day (QID) | ORAL | Status: DC | PRN
  Administered 2018-03-08 – 2018-03-10 (×5): 650 mg via ORAL
  Filled 2018-03-08 (×5): qty 2

## 2018-03-08 MED ORDER — DIAZEPAM 5 MG PO TABS
5.0000 mg | ORAL_TABLET | Freq: Every day | ORAL | Status: DC
  Administered 2018-03-08 – 2018-03-12 (×5): 5 mg via ORAL
  Filled 2018-03-08 (×5): qty 1

## 2018-03-08 MED ORDER — OSELTAMIVIR PHOSPHATE 75 MG PO CAPS
75.0000 mg | ORAL_CAPSULE | Freq: Every day | ORAL | Status: AC
  Administered 2018-03-08 – 2018-03-12 (×5): 75 mg via ORAL
  Filled 2018-03-08 (×5): qty 1

## 2018-03-08 MED ORDER — BACLOFEN 20 MG PO TABS
20.0000 mg | ORAL_TABLET | Freq: Three times a day (TID) | ORAL | Status: DC
  Administered 2018-03-08 – 2018-03-12 (×13): 20 mg via ORAL
  Filled 2018-03-08 (×13): qty 1

## 2018-03-08 MED ORDER — SODIUM CHLORIDE 0.9 % IV BOLUS
1000.0000 mL | Freq: Once | INTRAVENOUS | Status: AC
  Administered 2018-03-08: 1000 mL via INTRAVENOUS

## 2018-03-08 MED ORDER — DIAZEPAM 5 MG PO TABS
10.0000 mg | ORAL_TABLET | Freq: Every day | ORAL | Status: DC
  Administered 2018-03-08 – 2018-03-11 (×5): 10 mg via ORAL
  Filled 2018-03-08 (×5): qty 2

## 2018-03-08 MED ORDER — KCL IN DEXTROSE-NACL 10-5-0.45 MEQ/L-%-% IV SOLN
INTRAVENOUS | Status: DC
  Administered 2018-03-08 – 2018-03-09 (×3): via INTRAVENOUS
  Filled 2018-03-08 (×3): qty 1000

## 2018-03-08 MED ORDER — SODIUM CHLORIDE 0.9 % IV SOLN
1.0000 g | INTRAVENOUS | Status: DC
  Administered 2018-03-08 – 2018-03-09 (×2): 1 g via INTRAVENOUS
  Filled 2018-03-08 (×2): qty 10

## 2018-03-08 MED ORDER — ONDANSETRON 4 MG PO TBDP
4.0000 mg | ORAL_TABLET | Freq: Three times a day (TID) | ORAL | Status: DC | PRN
  Administered 2018-03-08 – 2018-03-09 (×2): 4 mg via ORAL
  Filled 2018-03-08 (×2): qty 1

## 2018-03-08 MED ORDER — SERTRALINE HCL 100 MG PO TABS
100.0000 mg | ORAL_TABLET | Freq: Every day | ORAL | Status: DC
  Administered 2018-03-08 – 2018-03-12 (×5): 100 mg via ORAL
  Filled 2018-03-08 (×5): qty 1

## 2018-03-08 MED ORDER — OSELTAMIVIR PHOSPHATE 75 MG PO CAPS
75.0000 mg | ORAL_CAPSULE | Freq: Once | ORAL | Status: AC
  Administered 2018-03-08: 75 mg via ORAL
  Filled 2018-03-08: qty 1

## 2018-03-08 MED ORDER — GABAPENTIN 400 MG PO CAPS
800.0000 mg | ORAL_CAPSULE | Freq: Three times a day (TID) | ORAL | Status: DC
  Administered 2018-03-08 – 2018-03-12 (×13): 800 mg via ORAL
  Filled 2018-03-08 (×13): qty 2

## 2018-03-08 MED ORDER — LOPERAMIDE HCL 2 MG PO CAPS
4.0000 mg | ORAL_CAPSULE | Freq: Three times a day (TID) | ORAL | Status: DC | PRN
  Administered 2018-03-08 – 2018-03-10 (×3): 4 mg via ORAL
  Filled 2018-03-08 (×3): qty 2

## 2018-03-08 MED ORDER — PANTOPRAZOLE SODIUM 40 MG PO TBEC
40.0000 mg | DELAYED_RELEASE_TABLET | Freq: Every day | ORAL | Status: DC
  Administered 2018-03-08 – 2018-03-12 (×5): 40 mg via ORAL
  Filled 2018-03-08 (×5): qty 1

## 2018-03-08 MED ORDER — LAMOTRIGINE 25 MG PO TABS
50.0000 mg | ORAL_TABLET | Freq: Every day | ORAL | Status: DC
  Administered 2018-03-08 – 2018-03-12 (×5): 50 mg via ORAL
  Filled 2018-03-08 (×5): qty 2

## 2018-03-08 NOTE — Progress Notes (Signed)
Patient is alert and responsive. Around 6 am vitals were T 100.5 P 107  R 22 BP 102/63. On Call MD was called and ordered to administer 0.9 NaCl 1000 ml bolus. Will closely monitor.

## 2018-03-08 NOTE — ED Notes (Signed)
ED TO INPATIENT HANDOFF REPORT  Name/Age/Gender [name] 37 y.o. female  Code Status Code Status History    Date Active Date Inactive Code Status Order ID Comments User Context   01/02/2018 0028 01/04/2018 1909 Full Code [id number]  Lenore Cordia, MD ED   04/24/2017 2310 04/30/2017 1508 Full Code [id number]  Elmarie Shiley, MD Inpatient   03/26/2017 0453 04/03/2017 2111 Full Code [id number]  [name], DO ED   02/14/2017 0100 03/12/2017 1736 Full Code [id number]  Phillips Grout, MD Inpatient   11/15/2016 0245 11/17/2016 1848 Full Code [id number]  [name], MD Inpatient   07/17/2016 2321 07/19/2016 2047 Full Code [id number]  Ivor Costa, MD ED   02/15/2016 2217 02/25/2016 2001 Full Code [id number]  [name], MD Inpatient   01/07/2016 0958 01/09/2016 1424 Full Code [id number]  [name], MD Inpatient   10/29/2014 2114 10/31/2014 1711 Full Code [id number]  [name], MD Inpatient   09/27/2014 2249 09/28/2014 2046 Full Code [id number]  [name], DO ED   03/30/2013 1215 04/02/2013 1732 Full Code [id number]  [name], MD Inpatient   02/16/2012 0146 02/18/2012 1853 Full Code [id number]  [name]., DO ED    Advance Directive Documentation     Most Recent Value  Type of Advance Directive  Healthcare Power of Attorney, Living will  Pre-existing out of facility DNR order (yellow form or pink MOST form)  -  "MOST" Form in Place?  -      Home/SNF/Other Nursing Home  Chief Complaint sepsis  Level of Care/Admitting Diagnosis ED Disposition    ED Disposition Condition [place]: [facility] [[id number]]  Level of Care: Med-Surg [16]  Diagnosis: Influenza A [431540]  Admitting Physician: Eston Esters  Attending Physician: Eston Esters  Estimated length of stay: past midnight tomorrow  Certification:: I certify this patient will need inpatient services for at least 2  midnights  PT Class (Do Not Modify): Inpatient [101]  PT Acc Code (Do Not Modify): Private [1]       Medical History Past Medical History:  Diagnosis Date  . Bladder stones    Hx of  . Depression   . GERD (gastroesophageal reflux disease)   . Intertrigo 04/24/2017  . Left upper extremity swelling 04/24/2017  . Multiple sclerosis ([place])   . Paraplegia ([place])   . PTSD (post-traumatic stress disorder)   . Transverse myelitis ([place])   . Yeast infection 09/26/2017    Allergies Allergies  Allergen Reactions  . Iodine Anaphylaxis  . Latex Anaphylaxis  . Polymyxin B Hives    Pt had hives in right arm with infusion of polymyxin on November 26th, 2018  . Shellfish-Derived Products Anaphylaxis    Reaction unknown  . Vancomycin Anaphylaxis  . Aspirin Other (See Comments)    Reaction unknown  . Strawberry Extract Hives and Rash    IV Location/Drains/Wounds Patient Lines/Drains/Airways Status   Active Line/Drains/Airways    Name:   Placement date:   Placement time:   Site:   Days:   Peripheral IV 03/07/18 Right Forearm   03/07/18    1806    Forearm   1   Peripheral IV 03/07/18 Left;Upper Arm   03/07/18    1818    Arm   1   Rectal Tube/Pouch   03/26/17    1500    -   347   Urostomy Ureterostomy right RLQ   -    -  RLQ      Pressure Injury 03/03/17 Stage I -  Intact skin with non-blanchable redness of a localized area usually over a bony prominence. small, red non-blanchable area   03/03/17    1130     370   Pressure Injury 04/24/17 Stage IV - Full thickness tissue loss with exposed bone, tendon or muscle. WOC updated, palpable bone in wound base 04/25/17   04/24/17    2237     318   Wound / Incision (Open or Dehisced) 01/02/18 Perineum Right;Left reddened peri area   01/02/18    0200    Perineum   65          Labs/Imaging Results for orders placed or performed during the hospital encounter of 03/07/18 (from the past 48 hour(s))  Comprehensive metabolic panel     Status: Abnormal    Collection Time: 03/07/18  5:52 PM  Result Value Ref Range   Sodium 135 135 - 145 mmol/L   Potassium 3.6 3.5 - 5.1 mmol/L   Chloride 100 98 - 111 mmol/L   CO2 24 22 - 32 mmol/L   Glucose, Bld 118 (H) 70 - 99 mg/dL   BUN 14 6 - 20 mg/dL   Creatinine, Ser 0.57 0.44 - 1.00 mg/dL   Calcium 8.8 (L) 8.9 - 10.3 mg/dL   Total Protein 6.7 6.5 - 8.1 g/dL   Albumin 3.9 3.5 - 5.0 g/dL   AST 37 15 - 41 U/L   ALT 29 0 - 44 U/L   Alkaline Phosphatase 70 38 - 126 U/L   Total Bilirubin 0.6 0.3 - 1.2 mg/dL   GFR calc non Af Amer >60 >60 mL/min   GFR calc Af Amer >60 >60 mL/min   Anion gap 11 5 - 15    Comment: Performed at [facility], [place] [address] [place], [place] [address]  CBC WITH DIFFERENTIAL     Status: Abnormal   Collection Time: 03/07/18  5:52 PM  Result Value Ref Range   WBC 8.7 4.0 - 10.5 K/uL   RBC 4.55 3.87 - 5.11 MIL/uL   Hemoglobin 12.6 12.0 - 15.0 g/dL   HCT 39.4 36.0 - 46.0 %   MCV 86.6 80.0 - 100.0 fL   MCH 27.7 26.0 - 34.0 pg   MCHC 32.0 30.0 - 36.0 g/dL   RDW 15.0 11.5 - 15.5 %   Platelets 145 (L) 150 - 400 K/uL   nRBC 0.0 0.0 - 0.2 %   Neutrophils Relative % 89 %   Neutro Abs 7.7 1.7 - 7.7 K/uL   Lymphocytes Relative 8 %   Lymphs Abs 0.7 0.7 - 4.0 K/uL   Monocytes Relative 2 %   Monocytes Absolute 0.2 0.1 - 1.0 K/uL   Eosinophils Relative 0 %   Eosinophils Absolute 0.0 0.0 - 0.5 K/uL   Basophils Relative 0 %   Basophils Absolute 0.0 0.0 - 0.1 K/uL   Immature Granulocytes 1 %   Abs Immature Granulocytes 0.06 0.00 - 0.07 K/uL    Comment: Performed at [facility], [place] [address] [place], [place] [address]  I-Stat CG4 Lactic Acid, ED     Status: Abnormal   Collection Time: 03/07/18  6:17 PM  Result Value Ref Range   Lactic Acid, Venous 2.68 (HH) 0.5 - 1.9 mmol/L   Comment NOTIFIED PHYSICIAN   Urinalysis, Routine w reflex microscopic     Status: Abnormal   Collection Time: 03/07/18  7:26 PM  Result  Value Ref Range    Color, Urine AMBER (A) YELLOW    Comment: BIOCHEMICALS MAY BE AFFECTED BY COLOR   APPearance CLEAR CLEAR   Specific Gravity, Urine 1.011 1.005 - 1.030   pH 6.0 5.0 - 8.0   Glucose, UA NEGATIVE NEGATIVE mg/dL   Hgb urine dipstick MODERATE (A) NEGATIVE   Bilirubin Urine NEGATIVE NEGATIVE   Ketones, ur NEGATIVE NEGATIVE mg/dL   Protein, ur NEGATIVE NEGATIVE mg/dL   Nitrite POSITIVE (A) NEGATIVE   Leukocytes, UA LARGE (A) NEGATIVE   RBC / HPF 0-5 0 - 5 RBC/hpf   WBC, UA 11-20 0 - 5 WBC/hpf   Bacteria, UA MANY (A) NONE SEEN   Squamous Epithelial / LPF 0-5 0 - 5   Mucus PRESENT     Comment: Performed at [facility], [place] [address], [place], [place] [address]  I-Stat CG4 Lactic Acid, ED     Status: None   Collection Time: 03/07/18  7:59 PM  Result Value Ref Range   Lactic Acid, Venous 0.53 0.5 - 1.9 mmol/L  MRSA PCR Screening     Status: None   Collection Time: 03/07/18  9:58 PM  Result Value Ref Range   MRSA by PCR NEGATIVE NEGATIVE    Comment:        The GeneXpert MRSA Assay (FDA approved for NASAL specimens only), is one component of a comprehensive MRSA colonization surveillance program. It is not intended to diagnose MRSA infection nor to guide or monitor treatment for MRSA infections. Performed at [facility], [place] [address], [place], [place] [address]   Influenza panel by PCR (type A & B)     Status: Abnormal   Collection Time: 03/07/18  9:58 PM  Result Value Ref Range   Influenza A By PCR POSITIVE (A) NEGATIVE   Influenza B By PCR NEGATIVE NEGATIVE    Comment: (NOTE) The Xpert Xpress Flu assay is intended as an aid in the diagnosis of  influenza and should not be used as a sole basis for treatment.  This  assay is FDA approved for nasopharyngeal swab specimens only. Nasal  washings and aspirates are unacceptable for Xpert Xpress Flu testing. Performed at [facility], [place] [address] [place], [place]  [address]    Dg Chest Port 1 View  Result Date: 03/07/2018 CLINICAL DATA:  Sepsis.  Tachycardia and fever. EXAM: PORTABLE CHEST 1 VIEW COMPARISON:  01/01/2018 FINDINGS: The heart size and mediastinal contours are within normal limits. Both lungs are clear. The visualized skeletal structures are unremarkable. IMPRESSION: No active disease. Electronically Signed   By: Nelson Chimes M.D.   On: 03/07/2018 18:13   EKG Interpretation  Date/Time:  Friday March 07 2018 17:44:10 EST Ventricular Rate:  113 PR Interval:    QRS Duration: 84 QT Interval:  315 QTC Calculation: 432 R Axis:   61 Text Interpretation:  Sinus tachycardia Low voltage, precordial leads Since last tracing rate faster Confirmed by Daleen Bo [phone]) on 03/07/2018 6:34:26 PM   Pending Labs Unresulted Labs (From admission, onward)    Start     Ordered   03/07/18 1836  Urine Culture  ONCE - STAT,   STAT     03/07/18 1835   03/07/18 1819  Respiratory Panel by PCR  (Respiratory virus panel with precautions)  Once,   R     03/07/18 1818   03/07/18 1752  Blood Culture (routine x 2)  BLOOD CULTURE X 2,   STAT  03/07/18 1752   Signed and Held  CBC  (enoxaparin (LOVENOX)    CrCl >/= 30 ml/min)  Once,   R    Comments:  Baseline for enoxaparin therapy IF NOT ALREADY DRAWN.  Notify MD if PLT < 100 K.    Signed and Held   Signed and Held  Creatinine, serum  (enoxaparin (LOVENOX)    CrCl >/= 30 ml/min)  Once,   R    Comments:  Baseline for enoxaparin therapy IF NOT ALREADY DRAWN.    Signed and Held   Signed and Held  Creatinine, serum  (enoxaparin (LOVENOX)    CrCl >/= 30 ml/min)  Weekly,   R    Comments:  while on enoxaparin therapy    Signed and Held          Vitals/Pain Today's Vitals   03/07/18 2300 03/07/18 2330 03/08/18 0000 03/08/18 0030  BP: (!) 82/47 (!) 87/37 (!) 108/51 115/60  Pulse: (!) 116 (!) 112 (!) 117 (!) 117  Resp: (!) 24 (!) 21 (!) 22 (!) 23  Temp:      TempSrc:      SpO2: 94% 94% 93% 94%     Isolation Precautions Droplet precaution  Medications Medications  0.9 %  sodium chloride infusion (1,000 mLs Intravenous New Bag/Given 03/07/18 1808)  sodium chloride 0.9 % bolus 1,000 mL (0 mLs Intravenous Stopped 03/07/18 1921)    And  sodium chloride 0.9 % bolus 1,000 mL (0 mLs Intravenous Stopped 03/07/18 2028)    And  sodium chloride 0.9 % bolus 500 mL (0 mLs Intravenous Stopped 03/07/18 2000)  ceFEPIme (MAXIPIME) 2 g in sodium chloride 0.9 % 100 mL IVPB (0 g Intravenous Stopped 03/07/18 1905)  ipratropium-albuterol (DUONEB) 0.5-2.5 (3) MG/3ML nebulizer solution 3 mL (3 mLs Nebulization Given 03/07/18 2217)  oseltamivir (TAMIFLU) capsule 75 mg (75 mg Oral Given 03/08/18 0042)    Mobility non-ambulatory

## 2018-03-08 NOTE — Progress Notes (Signed)
Patient is alert and responsive. Around 3:20 am mews score was 6, On call MD was paged, Rapid Response RN was notified about patient's status. Vitals were closely monitored. Patient's mother stated that the BP is always low. Latest Vitals are: T 100.2 (Tylenol was administered, cooling measures in place) P 108 (On IV fluids) R 22 (On oxygen via [place] @ 3l) BP 95/48 (BP always low as per Patient's mother and that's her normal level). Will monitor closely.

## 2018-03-08 NOTE — H&P (Addendum)
History and Physical    [name] CXK:[id number] DOB: 09-20-1980 DOA: 03/07/2018  PCP: Lennie Odor, PA-C  Patient coming from: snf ([facility])   Chief Complaint: cough  HPI: [name] is a 37 y.o. female with medical history significant for multiple sclerosis, transverse myelitis, paraplegia, nursing-home residing, history pelvic osteomyelitis now resolved, and DVT, who presents with above.  Symptoms began approximately 3 days. Predominately cough (both dry and productive), as well as mild shortness of breath, subjective fever, and fatigue. No nausea or vomiting. Decreased appetite but tolerating liquids. No chest pain. No abdominal pain. No new LE swelling.   ED Course:  Code sepsis, 3.5 L NS, cefepime, labs, cxr  Review of Systems: As per HPI otherwise 10 point review of systems negative.    Past Medical History:  Diagnosis Date  . Bladder stones    Hx of  . Depression   . GERD (gastroesophageal reflux disease)   . Intertrigo 04/24/2017  . Left upper extremity swelling 04/24/2017  . Multiple sclerosis ([place])   . Paraplegia ([place])   . PTSD (post-traumatic stress disorder)   . Transverse myelitis ([place])   . Yeast infection 09/26/2017    Past Surgical History:  Procedure Laterality Date  . CHOLECYSTECTOMY    . CYSTECTOMY W/ URETEROILEAL CONDUIT    . IR FLUORO GUIDE CV LINE LEFT  02/25/2017  . IR US GUIDE VASC ACCESS LEFT  02/25/2017  . IRRIGATION AND DEBRIDEMENT ABSCESS N/A 02/18/2017   Procedure: IRRIGATION AND DEBRIDEMENT ABSCESS;  Surgeon: Excell Seltzer, MD;  Location: [facility];  Service: General;  Laterality: N/A;  . TONSILLECTOMY    . WISDOM TOOTH EXTRACTION       reports that she has never smoked. She has never used smokeless tobacco. She reports that she does not drink alcohol or use drugs.  Allergies  Allergen Reactions  . Iodine Anaphylaxis  . Latex Anaphylaxis  . Polymyxin B Hives    Pt had hives in right arm with infusion of polymyxin on  November 26th, 2018  . Shellfish-Derived Products Anaphylaxis    Reaction unknown  . Vancomycin Anaphylaxis  . Aspirin Other (See Comments)    Reaction unknown  . Strawberry Extract Hives and Rash    Family History  Problem Relation Age of Onset  . Diabetes type II Father   . CAD Father   . Dementia Father   . Stroke Father   . Seizures Father   . Diabetes type II Mother   . Hypertension Mother   . Breast cancer Other   . Diabetes type II Other     Prior to Admission medications   Medication Sig Start Date End Date Taking? Authorizing Provider  baclofen (LIORESAL) 20 MG tablet Take 20 mg by mouth 3 (three) times daily.   Yes [provider]  diazepam (VALIUM) 5 MG tablet Take 1-2 tablets (5-10 mg total) by mouth 3 (three) times daily. Take 1 tablet in AM, 1 tablet in PM, and 2 tablets at bedtime Patient taking differently: Take 5-10 mg by mouth 2 (two) times daily. Take 1 tablet (5 mg) BID and Take 2 tablets (10 mg) at bedtime 04/30/17  Yes Mikhail, [place], DO  gabapentin (NEURONTIN) 400 MG capsule Take 2 capsules (800 mg total) by mouth 3 (three) times daily. 08/13/16  Yes Tomi Likens, Adam R, DO  lamoTRIgine (LAMICTAL) 25 MG tablet Take 2 tablets (50 mg total) by mouth daily. 04/03/17  Yes Bonnielee Haff, MD  Multiple Vitamins-Minerals (CERTAGEN PO) Take  1 tablet by mouth daily.   Yes [provider]  omeprazole (PRILOSEC) 20 MG capsule Take 20 mg by mouth daily.   Yes [provider]  sertraline (ZOLOFT) 100 MG tablet Take 1 tablet (100 mg total) by mouth 2 (two) times daily. 04/03/17  Yes Bonnielee Haff, MD  acetaminophen (TYLENOL) 325 MG tablet Take 650 mg by mouth every 6 (six) hours as needed for fever.    [provider]  bisacodyl (DULCOLAX) 10 MG suppository Place 1 suppository (10 mg total) daily rectally. Patient taking differently: Place 10 mg rectally daily as needed for mild constipation.  02/15/17   Florencia Reasons, MD  diphenhydrAMINE (BENADRYL)  25 mg capsule Take 1 capsule (25 mg total) by mouth every 8 (eight) hours as needed for itching. Patient taking differently: Take 50 mg by mouth every 8 (eight) hours as needed for itching.  03/12/17   Rosita Fire, MD  fluticasone [facility]) 50 MCG/ACT nasal spray Place 2 sprays into the nose daily as needed for allergies.     [provider]  ibuprofen (ADVIL,MOTRIN) 200 MG tablet Take 200-400 mg by mouth every 6 (six) hours as needed for headache or moderate pain.     [provider]  loperamide (IMODIUM) 2 MG capsule Take 2 capsules (4 mg total) by mouth 3 (three) times daily as needed for diarrhea or loose stools. 04/03/17   Bonnielee Haff, MD  loratadine (CLARITIN) 5 MG/5ML syrup Take 5 mg daily as needed by mouth for allergies.     [provider]  ondansetron (ZOFRAN-ODT) 4 MG disintegrating tablet Take 1 tablet (4 mg total) by mouth every 8 (eight) hours as needed for nausea or vomiting. 03/12/17   Rosita Fire, MD    Physical Exam: Vitals:   03/07/18 2200 03/07/18 2230 03/07/18 2300 03/07/18 2330  BP: (!) 89/43 (!) 93/41 (!) 82/47 (!) 87/37  Pulse: (!) 108 (!) 120 (!) 116 (!) 112  Resp: 19 (!) 23 (!) 24 (!) 21  Temp:      TempSrc:      SpO2: 94% 92% 94% 94%    Constitutional: No acute distress Head: Atraumatic Eyes: Conjunctiva clear ENM: dry mucous membranes. Normal dentition.  Neck: Supple Respiratory: diffuse rhonchi throughout, no wheeze, nothing focal. No accessory muscle use. . Cardiovascular: mild tachycardia, normal rhythm. No murmurs/rubs/gallops. Abdomen: Non-tender, mildly distended. No masses. No rebound or guarding. Positive bowel sounds. Bladder ostomy with attached bag Musculoskeletal: contractures lower extremities, decreased muscle tone throughout Skin: Buttock evaluated by ED physician and reported to me to be without lesions.  Extremities:trace LE pitting edema. Neurologic: paraplegic. Moves both upper extremities  semetrically Psychiatric: Normal insight and judgement.   Labs on Admission: I have personally reviewed following labs and imaging studies  CBC: Recent Labs  Lab 03/07/18 1752  WBC 8.7  NEUTROABS 7.7  HGB 12.6  HCT 39.4  MCV 86.6  PLT 222*   Basic Metabolic Panel: Recent Labs  Lab 03/07/18 1752  NA 135  K 3.6  CL 100  CO2 24  GLUCOSE 118*  BUN 14  CREATININE 0.57  CALCIUM 8.8*   GFR: CrCl cannot be calculated (Unknown ideal weight.). Liver Function Tests: Recent Labs  Lab 03/07/18 1752  AST 37  ALT 29  ALKPHOS 70  BILITOT 0.6  PROT 6.7  ALBUMIN 3.9   No results for input(s): LIPASE, AMYLASE in the last 168 hours. No results for input(s): AMMONIA in the last 168 hours. Coagulation Profile: No results  for input(s): INR, PROTIME in the last 168 hours. Cardiac Enzymes: No results for input(s): CKTOTAL, CKMB, CKMBINDEX, TROPONINI in the last 168 hours. BNP (last 3 results) No results for input(s): PROBNP in the last 8760 hours. HbA1C: No results for input(s): HGBA1C in the last 72 hours. CBG: No results for input(s): GLUCAP in the last 168 hours. Lipid Profile: No results for input(s): CHOL, HDL, LDLCALC, TRIG, CHOLHDL, LDLDIRECT in the last 72 hours. Thyroid Function Tests: No results for input(s): TSH, T4TOTAL, FREET4, T3FREE, THYROIDAB in the last 72 hours. Anemia Panel: No results for input(s): VITAMINB12, FOLATE, FERRITIN, TIBC, IRON, RETICCTPCT in the last 72 hours. Urine analysis:    Component Value Date/Time   COLORURINE AMBER (A) 03/07/2018 1926   APPEARANCEUR CLEAR 03/07/2018 1926   LABSPEC 1.011 03/07/2018 1926   PHURINE 6.0 03/07/2018 1926   GLUCOSEU NEGATIVE 03/07/2018 1926   HGBUR MODERATE (A) 03/07/2018 1926   BILIRUBINUR NEGATIVE 03/07/2018 1926   KETONESUR NEGATIVE 03/07/2018 1926   PROTEINUR NEGATIVE 03/07/2018 1926   UROBILINOGEN 2.0 (H) 10/29/2014 1627   NITRITE POSITIVE (A) 03/07/2018 1926   LEUKOCYTESUR LARGE (A) 03/07/2018  1926    Radiological Exams on Admission: Dg Chest Port 1 View  Result Date: 03/07/2018 CLINICAL DATA:  Sepsis.  Tachycardia and fever. EXAM: PORTABLE CHEST 1 VIEW COMPARISON:  01/01/2018 FINDINGS: The heart size and mediastinal contours are within normal limits. Both lungs are clear. The visualized skeletal structures are unremarkable. IMPRESSION: No active disease. Electronically Signed   By: Nelson Chimes M.D.   On: 03/07/2018 18:13    EKG: Independently reviewed. nsr  Assessment/Plan Active Problems:   Paraplegia (HCC)   Multiple sclerosis (HCC)   History of transverse myelitis   Influenza A   Osteomyelitis, pelvis (HCC)   Thrombocytopenia ([place])   # Influenza A - several days respiratory symptoms. Rapid flu a positive; flu starting to be epidemic, and resides in a group home. CXR unremarkable. Mild hypoxia resolved with 2 L [place]. Meeting sepsis criteria, but after aggressive fluid resuscitation vitals stable, mentating clearly, in no distress, think admission reasonable given comorbidities and mild hypoxia. - tamiflu - maintenance fluids - [place] o2, wean as able - incentive spirometry - holding on further abx; received cefepime in ED - f/u blood and urine cultures  # multiple sclerosis # transverse myelitis - continue home diazepam, baclofen, gabapentin  # thrombocytopenia - chronic mild (145 today), etiology unclear, hiv and hcv neg - monitor   DVT prophylaxis: lovenox Code Status: full  Family Communication: mother [phone]  Disposition Plan: tbd  Consults called: none  Admission status: med/surg    Desma Maxim MD [facility] Pager [phone]  If 7PM-7AM, please contact night-coverage [email or web address] Password [facility]  03/08/2018, 12:25 AM

## 2018-03-08 NOTE — Progress Notes (Signed)
Patient ID: [name], female   DOB: 1980/10/17, 37 y.o.   MRN: [id number] Patient was admitted early this morning for cough and was found to have influenza A.  Patient was initially given intravenous antibiotics in the ED but not continued after influenza a was positive.  Patient seen and examined at bedside and plan of care discussed with the patient and mother at bedside.  Patient is still having temperature spikes.  Patient has history of recurrent urinary tract infections and cannot explain the symptoms feel the symptoms because of paraplegia.  Will empirically start Rocephin and follow cultures.  I have reviewed the patient's medical records including this morning's H&P myself and reviewed current vitals, labs and medications.  Repeat a.m. labs.

## 2018-03-09 DIAGNOSIS — N390 Urinary tract infection, site not specified: Secondary | ICD-10-CM

## 2018-03-09 DIAGNOSIS — A419 Sepsis, unspecified organism: Secondary | ICD-10-CM

## 2018-03-09 DIAGNOSIS — G35 Multiple sclerosis: Secondary | ICD-10-CM

## 2018-03-09 LAB — CBC WITH DIFFERENTIAL/PLATELET
Abs Immature Granulocytes: 0.03 10*3/uL (ref 0.00–0.07)
Basophils Absolute: 0 10*3/uL (ref 0.0–0.1)
Basophils Relative: 0 %
Eosinophils Absolute: 0 10*3/uL (ref 0.0–0.5)
Eosinophils Relative: 0 %
HEMATOCRIT: 36.8 % (ref 36.0–46.0)
Hemoglobin: 11.3 g/dL — ABNORMAL LOW (ref 12.0–15.0)
Immature Granulocytes: 1 %
Lymphocytes Relative: 36 %
Lymphs Abs: 2.3 10*3/uL (ref 0.7–4.0)
MCH: 27.8 pg (ref 26.0–34.0)
MCHC: 30.7 g/dL (ref 30.0–36.0)
MCV: 90.4 fL (ref 80.0–100.0)
MONOS PCT: 4 %
Monocytes Absolute: 0.2 10*3/uL (ref 0.1–1.0)
Neutro Abs: 3.8 10*3/uL (ref 1.7–7.7)
Neutrophils Relative %: 59 %
Platelets: 111 10*3/uL — ABNORMAL LOW (ref 150–400)
RBC: 4.07 MIL/uL (ref 3.87–5.11)
RDW: 15.3 % (ref 11.5–15.5)
WBC: 6.3 10*3/uL (ref 4.0–10.5)
nRBC: 0 % (ref 0.0–0.2)

## 2018-03-09 LAB — COMPREHENSIVE METABOLIC PANEL
ALK PHOS: 45 U/L (ref 38–126)
ALT: 20 U/L (ref 0–44)
AST: 27 U/L (ref 15–41)
Albumin: 2.7 g/dL — ABNORMAL LOW (ref 3.5–5.0)
Anion gap: 8 (ref 5–15)
BILIRUBIN TOTAL: 0.6 mg/dL (ref 0.3–1.2)
BUN: 5 mg/dL — AB (ref 6–20)
CO2: 27 mmol/L (ref 22–32)
Calcium: 7.9 mg/dL — ABNORMAL LOW (ref 8.9–10.3)
Chloride: 107 mmol/L (ref 98–111)
Creatinine, Ser: 0.43 mg/dL — ABNORMAL LOW (ref 0.44–1.00)
GFR calc Af Amer: >60 mL/min (ref >60)
GFR calc non Af Amer: >60 mL/min (ref >60)
Glucose, Bld: 104 mg/dL — ABNORMAL HIGH (ref 70–99)
Potassium: 2.8 mmol/L — ABNORMAL LOW (ref 3.5–5.1)
Sodium: 142 mmol/L (ref 135–145)
TOTAL PROTEIN: 5.3 g/dL — AB (ref 6.5–8.1)

## 2018-03-09 LAB — MAGNESIUM: Magnesium: 1.8 mg/dL (ref 1.7–2.4)

## 2018-03-09 MED ORDER — POTASSIUM CHLORIDE CRYS ER 20 MEQ PO TBCR
40.0000 meq | EXTENDED_RELEASE_TABLET | ORAL | Status: AC
  Administered 2018-03-09 (×2): 40 meq via ORAL
  Filled 2018-03-09 (×2): qty 2

## 2018-03-09 MED ORDER — SULFAMETHOXAZOLE-TRIMETHOPRIM 800-160 MG PO TABS
1.0000 | ORAL_TABLET | Freq: Two times a day (BID) | ORAL | Status: DC
  Administered 2018-03-09 – 2018-03-11 (×4): 1 via ORAL
  Filled 2018-03-09 (×5): qty 1

## 2018-03-09 MED ORDER — SODIUM CHLORIDE 0.9 % IV SOLN
3.0000 g | Freq: Four times a day (QID) | INTRAVENOUS | Status: DC
  Administered 2018-03-09 – 2018-03-11 (×6): 3 g via INTRAVENOUS
  Filled 2018-03-09 (×8): qty 3

## 2018-03-09 MED ORDER — LIP MEDEX EX OINT
TOPICAL_OINTMENT | CUTANEOUS | Status: AC
  Filled 2018-03-09: qty 7

## 2018-03-09 MED ORDER — GUAIFENESIN-DM 100-10 MG/5ML PO SYRP
10.0000 mL | ORAL_SOLUTION | ORAL | Status: DC | PRN
  Administered 2018-03-10 – 2018-03-11 (×2): 10 mL via ORAL
  Filled 2018-03-09 (×3): qty 10

## 2018-03-09 NOTE — Plan of Care (Signed)
  Problem: Education: Goal: Knowledge of General Education information will improve Description Including pain rating scale, medication(s)/side effects and non-pharmacologic comfort measures Outcome: Progressing   Problem: Clinical Measurements: Goal: Ability to maintain clinical measurements within normal limits will improve Outcome: Progressing Goal: Will remain free from infection Outcome: Progressing Goal: Diagnostic test results will improve Outcome: Progressing Goal: Respiratory complications will improve Outcome: Progressing   Problem: Activity: Goal: Risk for activity intolerance will decrease Outcome: Progressing   Problem: Nutrition: Goal: Adequate nutrition will be maintained Outcome: Progressing   Problem: Coping: Goal: Level of anxiety will decrease Outcome: Progressing   Problem: Elimination: Goal: Will not experience complications related to bowel motility Outcome: Progressing Goal: Will not experience complications related to urinary retention Outcome: Progressing   Problem: Pain Managment: Goal: General experience of comfort will improve Outcome: Progressing   Problem: Safety: Goal: Ability to remain free from injury will improve Outcome: Progressing   Problem: Skin Integrity: Goal: Risk for impaired skin integrity will decrease Outcome: Progressing   Problem: Fluid Volume: Goal: Hemodynamic stability will improve Outcome: Progressing   Problem: Clinical Measurements: Goal: Diagnostic test results will improve Outcome: Progressing Goal: Signs and symptoms of infection will decrease Outcome: Progressing   Problem: Respiratory: Goal: Ability to maintain adequate ventilation will improve Outcome: Progressing

## 2018-03-09 NOTE — Plan of Care (Signed)

## 2018-03-09 NOTE — Progress Notes (Addendum)
Patient ID: [name], female   DOB: 08/23/1980, 37 y.o.   MRN: [id number]  PROGRESS NOTE    JELANI VREELAND  FIE:[id number] DOB: 1980/09/30 DOA: 03/07/2018 PCP: Lennie Odor, PA-C   Brief Narrative:  37 year old female with history of multiple sclerosis, transverse myelitis, paraplegia, nursing home resident, pelvic osteomyelitis, DVT presented with cough and fever.  She was found to have influenza A+.  She was treated with IV fluids and antibiotics.  Assessment & Plan:   Active Problems:   Paraplegia (HCC)   Multiple sclerosis (HCC)   History of transverse myelitis   Influenza A   Osteomyelitis, pelvis (HCC)   Thrombocytopenia (HCC)   Influenza A acute bronchitis -Patient has significant cough.  Continue Tamiflu.  Oxygen supplementation as needed  Sepsis secondary to above -Hemodynamically improving.  Still spiking temperatures.  Continue Tamiflu.  Antibiotic plan as below; discontinue IV fluids  Probable UTI, present on admission -Usually cannot experience her symptoms of UTI because of her neurological condition.  Continue Rocephin.  Follow cultures  Multiple sclerosis/transverse myelitis/paraplegia -Fall precautions.  Continue baclofen, gabapentin, diazepam  Hypokalemia -Replace.  Repeat a.m. labs  Thrombocytopenia -Questionable cause.  Monitor; no signs of bleeding  DVT prophylaxis: Lovenox Code Status: Full Family Communication: Mother at bedside Disposition Plan: Nursing home in 1 to 2 days if clinically improves  Consultants: None  Procedures: None  Antimicrobials: Rocephin from 03/08/2018 onwards Tamiflu from 03/07/2018 onwards Cefepime 1 dose on 03/07/2018   Subjective: Patient seen and examined at bedside.  Planes of cough, mostly dry.  Still having fevers.  Appetite slightly better.  No overnight vomiting  Objective: Vitals:   03/08/18 1051 03/08/18 2203 03/09/18 0222 03/09/18 0636  BP: (!) 103/56 (!) 120/56 (!) 92/52 100/61  Pulse: (!) 107  (!) 106 83 89  Resp: 20 20 18 16   Temp: (!) 102.6 F (39.2 C) (!) 102.5 F (39.2 C) 99.2 F (37.3 C) 99.6 F (37.6 C)  TempSrc: Oral Oral Oral Oral  SpO2: 95% 100% 98% 100%  Weight:      Height:        Intake/Output Summary (Last 24 hours) at 03/09/2018 0933 Last data filed at 03/09/2018 0600 Gross per 24 hour  Intake 1820.5 ml  Output 2800 ml  Net -979.5 ml   Filed Weights   03/08/18 0320  Weight: 75.3 kg    Examination:  General exam: Appears calm and comfortable.  Looks older than stated age Respiratory system: Bilateral decreased breath sounds at bases Cardiovascular system: S1 & S2 heard, Rate controlled Gastrointestinal system: Abdomen is nondistended, soft and nontender. Normal bowel sounds heard.  Bladder ostomy with bag Extremities: No cyanosis; trace edema    Data Reviewed: I have personally reviewed following labs and imaging studies  CBC: Recent Labs  Lab 03/07/18 1752 03/09/18 0439  WBC 8.7 6.3  NEUTROABS 7.7 3.8  HGB 12.6 11.3*  HCT 39.4 36.8  MCV 86.6 90.4  PLT 145* 166*   Basic Metabolic Panel: Recent Labs  Lab 03/07/18 1752 03/09/18 0439  NA 135 142  K 3.6 2.8*  CL 100 107  CO2 24 27  GLUCOSE 118* 104*  BUN 14 5*  CREATININE 0.57 0.43*  CALCIUM 8.8* 7.9*  MG  --  1.8   GFR: Estimated Creatinine Clearance: 95.1 mL/min (A) (by C-G formula based on SCr of 0.43 mg/dL (L)). Liver Function Tests: Recent Labs  Lab 03/07/18 1752 03/09/18 0439  AST 37 27  ALT 29 20  ALKPHOS 70 45  BILITOT 0.6 0.6  PROT 6.7 5.3*  ALBUMIN 3.9 2.7*   No results for input(s): LIPASE, AMYLASE in the last 168 hours. No results for input(s): AMMONIA in the last 168 hours. Coagulation Profile: No results for input(s): INR, PROTIME in the last 168 hours. Cardiac Enzymes: No results for input(s): CKTOTAL, CKMB, CKMBINDEX, TROPONINI in the last 168 hours. BNP (last 3 results) No results for input(s): PROBNP in the last 8760 hours. HbA1C: No results for  input(s): HGBA1C in the last 72 hours. CBG: No results for input(s): GLUCAP in the last 168 hours. Lipid Profile: No results for input(s): CHOL, HDL, LDLCALC, TRIG, CHOLHDL, LDLDIRECT in the last 72 hours. Thyroid Function Tests: No results for input(s): TSH, T4TOTAL, FREET4, T3FREE, THYROIDAB in the last 72 hours. Anemia Panel: No results for input(s): VITAMINB12, FOLATE, FERRITIN, TIBC, IRON, RETICCTPCT in the last 72 hours. Sepsis Labs: Recent Labs  Lab 03/07/18 1817 03/07/18 1959  LATICACIDVEN 2.68* 0.53    Recent Results (from the past 240 hour(s))  Blood Culture (routine x 2)     Status: None (Preliminary result)   Collection Time: 03/07/18  6:12 PM  Result Value Ref Range Status   Specimen Description   Final    BLOOD ARM LEFT UPPER Performed at [facility] [address] [place], [place] [address]    Special Requests   Final    BOTTLES DRAWN AEROBIC AND ANAEROBIC Blood Culture adequate volume Performed at [facility] [address], [place], [place] [address]    Culture   Final    NO GROWTH < 24 HOURS Performed at [facility] [address], [place], [place] [address]    Report Status PENDING  Incomplete  Urine Culture     Status: Abnormal (Preliminary result)   Collection Time: 03/07/18  7:26 PM  Result Value Ref Range Status   Specimen Description   Final    URINE, RANDOM Performed at [facility] [address] [place], [place] [address]    Special Requests   Final    NONE Performed at [facility], [place] [address] [place], [place] [address]    Culture (A)  Final    >=100,000 COLONIES/mL UNIDENTIFIED ORGANISM CULTURE REINCUBATED FOR BETTER GROWTH Performed at [facility], [place] [address] [place], [place] [address]    Report Status PENDING  Incomplete  Respiratory Panel by PCR     Status: Abnormal   Collection Time: 03/07/18  9:58 PM  Result Value Ref Range  Status   Adenovirus NOT DETECTED NOT DETECTED Final   Coronavirus 229E NOT DETECTED NOT DETECTED Final   Coronavirus HKU1 NOT DETECTED NOT DETECTED Final   Coronavirus NL63 NOT DETECTED NOT DETECTED Final   Coronavirus OC43 NOT DETECTED NOT DETECTED Final   Metapneumovirus NOT DETECTED NOT DETECTED Final   Rhinovirus / Enterovirus NOT DETECTED NOT DETECTED Final   Influenza A H1 2009 DETECTED (A) NOT DETECTED Final   Influenza B NOT DETECTED NOT DETECTED Final   Parainfluenza Virus 1 NOT DETECTED NOT DETECTED Final   Parainfluenza Virus 2 NOT DETECTED NOT DETECTED Final   Parainfluenza Virus 3 NOT DETECTED NOT DETECTED Final   Parainfluenza Virus 4 NOT DETECTED NOT DETECTED Final   Respiratory Syncytial Virus NOT DETECTED NOT DETECTED Final   Bordetella pertussis NOT DETECTED NOT DETECTED Final   Chlamydophila pneumoniae NOT DETECTED NOT DETECTED Final   Mycoplasma pneumoniae NOT DETECTED NOT DETECTED Final  Comment: Performed at [facility], [place] [address] [place], [place] [address]  MRSA PCR Screening     Status: None   Collection Time: 03/07/18  9:58 PM  Result Value Ref Range Status   MRSA by PCR NEGATIVE NEGATIVE Final    Comment:        The GeneXpert MRSA Assay (FDA approved for NASAL specimens only), is one component of a comprehensive MRSA colonization surveillance program. It is not intended to diagnose MRSA infection nor to guide or monitor treatment for MRSA infections. Performed at [facility], [place] [address] [place], [place] [address]   Blood Culture (routine x 2)     Status: None (Preliminary result)   Collection Time: 03/08/18  4:10 AM  Result Value Ref Range Status   Specimen Description   Final    BLOOD RIGHT HAND Blood Culture results may not be optimal due to an inadequate volume of blood received in culture bottles BOTTLES DRAWN AEROBIC ONLY Performed at [facility] [address], [place], [place]  [address]    Special Requests   Final    NONE Performed at [facility], [place] [address] [place], [place] [address]    Culture   Final    NO GROWTH < 12 HOURS Performed at [facility] [address], [place], [place] [address]    Report Status PENDING  Incomplete         Radiology Studies: Dg Chest Port 1 View  Result Date: 03/07/2018 CLINICAL DATA:  Sepsis.  Tachycardia and fever. EXAM: PORTABLE CHEST 1 VIEW COMPARISON:  01/01/2018 FINDINGS: The heart size and mediastinal contours are within normal limits. Both lungs are clear. The visualized skeletal structures are unremarkable. IMPRESSION: No active disease. Electronically Signed   By: Nelson Chimes M.D.   On: 03/07/2018 18:13        Scheduled Meds: . lip balm      . baclofen  20 mg Oral TID  . chlorhexidine  15 mL Mouth Rinse BID  . diazepam  10 mg Oral QHS  . diazepam  5 mg Oral Daily  . enoxaparin (LOVENOX) injection  40 mg Subcutaneous QHS  . gabapentin  800 mg Oral TID  . lamoTRIgine  50 mg Oral Daily  . mouth rinse  15 mL Mouth Rinse q12n4p  . oseltamivir  75 mg Oral Daily  . pantoprazole  40 mg Oral Daily  . potassium chloride  40 mEq Oral Q4H  . sertraline  100 mg Oral Daily   Continuous Infusions: . cefTRIAXone (ROCEPHIN)  IV 1 g (03/08/18 1050)  . dextrose 5 % and 0.45 % NaCl with KCl 10 mEq/L 75 mL/hr at 03/09/18 0600     LOS: 1 day        Aline August, MD [facility] Pager [phone]  If 7PM-7AM, please contact night-coverage [email or web address] Password [email or web address] 03/09/2018, 9:33 AM

## 2018-03-10 ENCOUNTER — Ambulatory Visit: Payer: Medicaid Other | Admitting: Infectious Disease

## 2018-03-10 ENCOUNTER — Inpatient Hospital Stay (HOSPITAL_COMMUNITY): Payer: Medicaid Other

## 2018-03-10 DIAGNOSIS — R509 Fever, unspecified: Secondary | ICD-10-CM

## 2018-03-10 LAB — CBC WITH DIFFERENTIAL/PLATELET
Abs Immature Granulocytes: 0.03 10*3/uL (ref 0.00–0.07)
Basophils Absolute: 0 10*3/uL (ref 0.0–0.1)
Basophils Relative: 0 %
Eosinophils Absolute: 0 10*3/uL (ref 0.0–0.5)
Eosinophils Relative: 1 %
HEMATOCRIT: 40.2 % (ref 36.0–46.0)
Hemoglobin: 12.6 g/dL (ref 12.0–15.0)
Immature Granulocytes: 1 %
LYMPHS ABS: 1.3 10*3/uL (ref 0.7–4.0)
Lymphocytes Relative: 29 %
MCH: 27.6 pg (ref 26.0–34.0)
MCHC: 31.3 g/dL (ref 30.0–36.0)
MCV: 88 fL (ref 80.0–100.0)
Monocytes Absolute: 0.1 10*3/uL (ref 0.1–1.0)
Monocytes Relative: 3 %
NRBC: 0 % (ref 0.0–0.2)
Neutro Abs: 2.9 10*3/uL (ref 1.7–7.7)
Neutrophils Relative %: 66 %
Platelets: 138 10*3/uL — ABNORMAL LOW (ref 150–400)
RBC: 4.57 MIL/uL (ref 3.87–5.11)
RDW: 15.6 % — AB (ref 11.5–15.5)
WBC: 4.4 10*3/uL (ref 4.0–10.5)

## 2018-03-10 LAB — BASIC METABOLIC PANEL
Anion gap: 8 (ref 5–15)
BUN: 5 mg/dL — ABNORMAL LOW (ref 6–20)
CO2: 25 mmol/L (ref 22–32)
Calcium: 8.1 mg/dL — ABNORMAL LOW (ref 8.9–10.3)
Chloride: 107 mmol/L (ref 98–111)
Creatinine, Ser: 0.39 mg/dL — ABNORMAL LOW (ref 0.44–1.00)
GFR calc Af Amer: >60 mL/min (ref >60)
GFR calc non Af Amer: >60 mL/min (ref >60)
Glucose, Bld: 112 mg/dL — ABNORMAL HIGH (ref 70–99)
Potassium: 4.1 mmol/L (ref 3.5–5.1)
Sodium: 140 mmol/L (ref 135–145)

## 2018-03-10 LAB — HCG, QUANTITATIVE, PREGNANCY: hCG, Beta Chain, Quant, S: <1 m[IU]/mL (ref <5)

## 2018-03-10 LAB — MAGNESIUM: Magnesium: 1.9 mg/dL (ref 1.7–2.4)

## 2018-03-10 NOTE — Evaluation (Signed)
OT Cancellation Note  Patient Details Name: [name] MRN: [id number] DOB: 1980-10-29   Cancelled Treatment:    Reason Eval/Treat Not Completed: Patient at procedure or test/ unavailable Pt off of floor for testing this date, will check back as available and appropriate to initiate OT POC.  Zenovia Jarred, MSOT, OTR/L Behavioral Health OT/ Acute Relief OT PHP Office: [place] 03/10/2018, 3:22 PM

## 2018-03-10 NOTE — Consult Note (Signed)
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  Watching only at this time, per mother, at bedside.  Resides at [facility] SN[facility] currently.  RUQ urostomy, connected to bedside drainage.  Wound type:Healed stage 4 Pressure Injury POA: Yes Measurement: Scarring (with defect noted) 2 cm x 2 cm with defect noted.  Skin is intact and no drainage noted.  Wound LMB:[id number] epithelialized Drainage (amount, consistency, odor) none  Patient sweats profusely and this area is exposed.  Keep in constant contact with dermatherapy linen for wicking properties.  No disposable briefs or underpads Periwound:intact Dressing procedure/placement/frequency:None turn and reposition and avoid disposables under patient.  [facility] [facility] Nurse ostomy follow up Stoma type/location: RUQ urostomy Stomal assessment/size: 1 "  Peristomal assessment: intact Treatment options for stomal/peristomal skin: 2 3/4" 2 piece urostomy  Connected to bedside drainage.  Output clear yellow urine Ostomy pouching: 2pc 2 3/4" urostomy pouch with bedisde drainage. .  Education provided: none Enrolled patient in [place] Start Discharge program: No Will not follow at this time.  Please re-consult if needed.  Domenic Moras MSN, RN, FNP-BC CWON Wound, Ostomy, Continence Nurse Pager [phone]

## 2018-03-10 NOTE — Plan of Care (Signed)
  Problem: Education: Goal: Knowledge of General Education information will improve Description Including pain rating scale, medication(s)/side effects and non-pharmacologic comfort measures Outcome: Progressing   Problem: Clinical Measurements: Goal: Ability to maintain clinical measurements within normal limits will improve Outcome: Progressing Goal: Will remain free from infection Outcome: Progressing Goal: Diagnostic test results will improve Outcome: Progressing Goal: Respiratory complications will improve Outcome: Progressing Goal: Cardiovascular complication will be avoided Outcome: Progressing   Problem: Activity: Goal: Risk for activity intolerance will decrease Outcome: Progressing   Problem: Nutrition: Goal: Adequate nutrition will be maintained Outcome: Progressing   Problem: Coping: Goal: Level of anxiety will decrease Outcome: Progressing   Problem: Elimination: Goal: Will not experience complications related to bowel motility Outcome: Progressing Goal: Will not experience complications related to urinary retention Outcome: Progressing   Problem: Pain Managment: Goal: General experience of comfort will improve Outcome: Progressing   Problem: Safety: Goal: Ability to remain free from injury will improve Outcome: Progressing   Problem: Skin Integrity: Goal: Risk for impaired skin integrity will decrease Outcome: Progressing   Problem: Fluid Volume: Goal: Hemodynamic stability will improve Outcome: Progressing   Problem: Clinical Measurements: Goal: Diagnostic test results will improve Outcome: Progressing Goal: Signs and symptoms of infection will decrease Outcome: Progressing   Problem: Respiratory: Goal: Ability to maintain adequate ventilation will improve Outcome: Progressing

## 2018-03-10 NOTE — Progress Notes (Signed)
Patient ID: [name], female   DOB: May 10, 1980, 37 y.o.   MRN: [id number]  PROGRESS NOTE    [name]  WUJ:[id number] DOB: January 11, 1981 DOA: 03/07/2018 PCP: Lennie Odor, PA-C   Brief Narrative:  37 year old female with history of multiple sclerosis, transverse myelitis, paraplegia, nursing home resident, pelvic osteomyelitis, DVT presented with cough and fever.  She was found to have influenza A+.  She was treated with IV fluids and antibiotics.  Assessment & Plan:   Active Problems:   Paraplegia (HCC)   Multiple sclerosis (HCC)   History of transverse myelitis   Influenza A   Osteomyelitis, pelvis (HCC)   Thrombocytopenia (HCC)   Influenza A acute bronchitis -Patient has significant cough.  Continue Tamiflu.  Oxygen supplementation as needed  Sepsis secondary to above -Hemodynamically improving.  Still spiking temperatures.  Continue Tamiflu.  Antibiotic plan as below.  Off IV fluids  Persistent fevers -Patient is still spiking temperatures.  Antibiotics were changed on 03/09/2018.  Will get CT scan of the chest, abdomen and pelvis without contrast.  Blood cultures negative so far  Probable UTI, present on admission -Usually cannot experience her symptoms of UTI because of her neurological condition.   -Urine culture growing enterococcus faecium and Stenotrophomonas maltophilia: Unclear about the significance of these bacteria.  Might be colonizers.  Might be causing active infection.  For now, continue Bactrim and Unasyn.  Follow sensitivities for enterococcus.  Rocephin was discontinued on 03/09/2018  Multiple sclerosis/transverse myelitis/paraplegia -Fall precautions.  Continue baclofen, gabapentin, diazepam  Hypokalemia -Improved.  Repeat a.m. labs  Thrombocytopenia -Questionable cause.  Monitor; no signs of bleeding  DVT prophylaxis: Lovenox Code Status: Full Family Communication: Mother at bedside Disposition Plan: Nursing home once clinically improves  and stop spiking temperatures.  Consultants: None  Procedures: None  Antimicrobials: Rocephin from 03/08/2018-03/09/2018  Tamiflu from 03/07/2018 onwards Cefepime 1 dose on 03/07/2018 Bactrim and Unasyn from 03/09/2018 onwards   Subjective: Patient seen and examined at bedside.  Still complains of intermittent dry cough.  Still having fever.  No overnight nausea or vomiting.  Appetite is improving. Objective: Vitals:   03/09/18 2228 03/10/18 0129 03/10/18 0606 03/10/18 1012  BP: 110/75 (!) 91/55 (!) 99/57 101/62  Pulse: 93 97 (!) 103 92  Resp: 16 16 18 18   Temp: 99.4 F (37.4 C) (!) 100.6 F (38.1 C) (!) 101.6 F (38.7 C) 99.7 F (37.6 C)  TempSrc: Oral Oral Oral Oral  SpO2: 100% 92% 97% 99%  Weight:      Height:        Intake/Output Summary (Last 24 hours) at 03/10/2018 1044 Last data filed at 03/10/2018 1016 Gross per 24 hour  Intake 1312.44 ml  Output 1450 ml  Net -137.56 ml   Filed Weights   03/08/18 0320  Weight: 75.3 kg    Examination:  General exam: Appears calm and comfortable.  Looks older than stated age.  No distress Respiratory system: Bilateral decreased breath sounds at bases, no wheezing Cardiovascular system: Rate controlled, S1-S2 heard Gastrointestinal system: Abdomen is nondistended, soft and nontender. Normal bowel sounds heard.  Bladder ostomy with bag Extremities: No cyanosis; trace edema    Data Reviewed: I have personally reviewed following labs and imaging studies  CBC: Recent Labs  Lab 03/07/18 1752 03/09/18 0439 03/10/18 0402  WBC 8.7 6.3 4.4  NEUTROABS 7.7 3.8 2.9  HGB 12.6 11.3* 12.6  HCT 39.4 36.8 40.2  MCV 86.6 90.4 88.0  PLT 145* 111* 956*   Basic Metabolic  Panel: Recent Labs  Lab 03/07/18 1752 03/09/18 0439 03/10/18 0402  NA 135 142 140  K 3.6 2.8* 4.1  CL 100 107 107  CO2 24 27 25   GLUCOSE 118* 104* 112*  BUN 14 5* 5*  CREATININE 0.57 0.43* 0.39*  CALCIUM 8.8* 7.9* 8.1*  MG  --  1.8 1.9   GFR: Estimated  Creatinine Clearance: 95.1 mL/min (A) (by C-G formula based on SCr of 0.39 mg/dL (L)). Liver Function Tests: Recent Labs  Lab 03/07/18 1752 03/09/18 0439  AST 37 27  ALT 29 20  ALKPHOS 70 45  BILITOT 0.6 0.6  PROT 6.7 5.3*  ALBUMIN 3.9 2.7*   No results for input(s): LIPASE, AMYLASE in the last 168 hours. No results for input(s): AMMONIA in the last 168 hours. Coagulation Profile: No results for input(s): INR, PROTIME in the last 168 hours. Cardiac Enzymes: No results for input(s): CKTOTAL, CKMB, CKMBINDEX, TROPONINI in the last 168 hours. BNP (last 3 results) No results for input(s): PROBNP in the last 8760 hours. HbA1C: No results for input(s): HGBA1C in the last 72 hours. CBG: No results for input(s): GLUCAP in the last 168 hours. Lipid Profile: No results for input(s): CHOL, HDL, LDLCALC, TRIG, CHOLHDL, LDLDIRECT in the last 72 hours. Thyroid Function Tests: No results for input(s): TSH, T4TOTAL, FREET4, T3FREE, THYROIDAB in the last 72 hours. Anemia Panel: No results for input(s): VITAMINB12, FOLATE, FERRITIN, TIBC, IRON, RETICCTPCT in the last 72 hours. Sepsis Labs: Recent Labs  Lab 03/07/18 1817 03/07/18 1959  LATICACIDVEN 2.68* 0.53    Recent Results (from the past 240 hour(s))  Blood Culture (routine x 2)     Status: None (Preliminary result)   Collection Time: 03/07/18  6:12 PM  Result Value Ref Range Status   Specimen Description   Final    BLOOD ARM LEFT UPPER Performed at [facility] [address] [place], [place] [address]    Special Requests   Final    BOTTLES DRAWN AEROBIC AND ANAEROBIC Blood Culture adequate volume Performed at [facility] [address], [place], [place] [address]    Culture   Final    NO GROWTH 2 DAYS Performed at [facility] [address], [place], [place] [address]    Report Status PENDING  Incomplete  Urine Culture     Status: Abnormal (Preliminary result)   Collection  Time: 03/07/18  7:26 PM  Result Value Ref Range Status   Specimen Description   Final    URINE, RANDOM Performed at [facility] [address] [place], [place] [address]    Special Requests   Final    NONE Performed at [facility], [place] [address] [place], [place] [address]    Culture (A)  Final    >=100,000 COLONIES/mL ENTEROCOCCUS FAECIUM SUSCEPTIBILITIES TO FOLLOW 10,000 COLONIES/mL STENOTROPHOMONAS MALTOPHILIA    Report Status PENDING  Incomplete   Organism ID, Bacteria STENOTROPHOMONAS MALTOPHILIA (A)  Final      Susceptibility   Stenotrophomonas maltophilia - MIC*    LEVOFLOXACIN 0.5 SENSITIVE Sensitive     TRIMETH/SULFA <=20 SENSITIVE Sensitive     * 10,000 COLONIES/mL STENOTROPHOMONAS MALTOPHILIA  Respiratory Panel by PCR     Status: Abnormal   Collection Time: 03/07/18  9:58 PM  Result Value Ref Range Status   Adenovirus NOT DETECTED NOT DETECTED Final   Coronavirus 229E NOT DETECTED NOT DETECTED Final   Coronavirus HKU1 NOT DETECTED NOT DETECTED Final  Coronavirus NL63 NOT DETECTED NOT DETECTED Final   Coronavirus OC43 NOT DETECTED NOT DETECTED Final   Metapneumovirus NOT DETECTED NOT DETECTED Final   Rhinovirus / Enterovirus NOT DETECTED NOT DETECTED Final   Influenza A H1 2009 DETECTED (A) NOT DETECTED Final   Influenza B NOT DETECTED NOT DETECTED Final   Parainfluenza Virus 1 NOT DETECTED NOT DETECTED Final   Parainfluenza Virus 2 NOT DETECTED NOT DETECTED Final   Parainfluenza Virus 3 NOT DETECTED NOT DETECTED Final   Parainfluenza Virus 4 NOT DETECTED NOT DETECTED Final   Respiratory Syncytial Virus NOT DETECTED NOT DETECTED Final   Bordetella pertussis NOT DETECTED NOT DETECTED Final   Chlamydophila pneumoniae NOT DETECTED NOT DETECTED Final   Mycoplasma pneumoniae NOT DETECTED NOT DETECTED Final    Comment: Performed at [facility], [place] [address] [place], [place] [address]  MRSA PCR Screening     Status:  None   Collection Time: 03/07/18  9:58 PM  Result Value Ref Range Status   MRSA by PCR NEGATIVE NEGATIVE Final    Comment:        The GeneXpert MRSA Assay (FDA approved for NASAL specimens only), is one component of a comprehensive MRSA colonization surveillance program. It is not intended to diagnose MRSA infection nor to guide or monitor treatment for MRSA infections. Performed at [facility], [place] [address], [place], [place] [address]   Blood Culture (routine x 2)     Status: None (Preliminary result)   Collection Time: 03/08/18  4:10 AM  Result Value Ref Range Status   Specimen Description   Final    BLOOD RIGHT HAND Blood Culture results may not be optimal due to an inadequate volume of blood received in culture bottles BOTTLES DRAWN AEROBIC ONLY Performed at [facility] [address], [place], [place] [address]    Special Requests   Final    NONE Performed at [facility], [place] [address] [place], [place] [address]    Culture   Final    NO GROWTH 1 DAY Performed at [facility], [place] [address] [place],  [address]    Report Status PENDING  Incomplete         Radiology Studies: No results found.      Scheduled Meds: . baclofen  20 mg Oral TID  . chlorhexidine  15 mL Mouth Rinse BID  . diazepam  10 mg Oral QHS  . diazepam  5 mg Oral Daily  . enoxaparin (LOVENOX) injection  40 mg Subcutaneous QHS  . gabapentin  800 mg Oral TID  . lamoTRIgine  50 mg Oral Daily  . mouth rinse  15 mL Mouth Rinse q12n4p  . oseltamivir  75 mg Oral Daily  . pantoprazole  40 mg Oral Daily  . sertraline  100 mg Oral Daily  . sulfamethoxazole-trimethoprim  1 tablet Oral Q12H   Continuous Infusions: . ampicillin-sulbactam (UNASYN) IV 3 g (03/10/18 5176)     LOS: 2 days        Aline August, MD [facility] Pager [phone]  If 7PM-7AM, please contact  night-coverage [email or web address] Password [email or web address] 03/10/2018, 10:44 AM

## 2018-03-11 LAB — COMPREHENSIVE METABOLIC PANEL
ALT: 24 U/L (ref 0–44)
ANION GAP: 12 (ref 5–15)
AST: 34 U/L (ref 15–41)
Albumin: 3.5 g/dL (ref 3.5–5.0)
Alkaline Phosphatase: 46 U/L (ref 38–126)
BUN: 7 mg/dL (ref 6–20)
CO2: 26 mmol/L (ref 22–32)
Calcium: 8.5 mg/dL — ABNORMAL LOW (ref 8.9–10.3)
Chloride: 105 mmol/L (ref 98–111)
Creatinine, Ser: 0.36 mg/dL — ABNORMAL LOW (ref 0.44–1.00)
GFR calc Af Amer: >60 mL/min (ref >60)
GFR calc non Af Amer: >60 mL/min (ref >60)
Glucose, Bld: 101 mg/dL — ABNORMAL HIGH (ref 70–99)
Potassium: 3.5 mmol/L (ref 3.5–5.1)
Sodium: 143 mmol/L (ref 135–145)
Total Bilirubin: 0.8 mg/dL (ref 0.3–1.2)
Total Protein: 6.6 g/dL (ref 6.5–8.1)

## 2018-03-11 LAB — C-REACTIVE PROTEIN: CRP: 7.6 mg/dL — ABNORMAL HIGH (ref <1.0)

## 2018-03-11 LAB — CBC WITH DIFFERENTIAL/PLATELET
Abs Immature Granulocytes: 0.02 10*3/uL (ref 0.00–0.07)
Basophils Absolute: 0 10*3/uL (ref 0.0–0.1)
Basophils Relative: 0 %
EOS PCT: 3 %
Eosinophils Absolute: 0.1 10*3/uL (ref 0.0–0.5)
HCT: 43.1 % (ref 36.0–46.0)
Hemoglobin: 13.3 g/dL (ref 12.0–15.0)
Immature Granulocytes: 1 %
Lymphocytes Relative: 38 %
Lymphs Abs: 1.2 10*3/uL (ref 0.7–4.0)
MCH: 27.1 pg (ref 26.0–34.0)
MCHC: 30.9 g/dL (ref 30.0–36.0)
MCV: 88 fL (ref 80.0–100.0)
Monocytes Absolute: 0.2 10*3/uL (ref 0.1–1.0)
Monocytes Relative: 5 %
Neutro Abs: 1.7 10*3/uL (ref 1.7–7.7)
Neutrophils Relative %: 53 %
Platelets: 127 10*3/uL — ABNORMAL LOW (ref 150–400)
RBC: 4.9 MIL/uL (ref 3.87–5.11)
RDW: 15.1 % (ref 11.5–15.5)
WBC: 3.2 10*3/uL — ABNORMAL LOW (ref 4.0–10.5)
nRBC: 0 % (ref 0.0–0.2)

## 2018-03-11 LAB — URINE CULTURE: Culture: >=100000 — AB

## 2018-03-11 LAB — MAGNESIUM: MAGNESIUM: 2.2 mg/dL (ref 1.7–2.4)

## 2018-03-11 MED ORDER — AMOXICILLIN-POT CLAVULANATE 875-125 MG PO TABS
1.0000 | ORAL_TABLET | Freq: Two times a day (BID) | ORAL | Status: DC
  Administered 2018-03-11 – 2018-03-12 (×3): 1 via ORAL
  Filled 2018-03-11 (×3): qty 1

## 2018-03-11 MED ORDER — BISACODYL 10 MG RE SUPP
10.0000 mg | Freq: Every day | RECTAL | Status: DC | PRN
  Administered 2018-03-12: 10 mg via RECTAL
  Filled 2018-03-11: qty 1

## 2018-03-11 NOTE — Progress Notes (Signed)
OT Cancellation Note  Patient Details Name: [name] MRN: [id number] DOB: Aug 05, 1980   Cancelled Treatment:    Reason Eval/Treat Not Completed: OT screened, no needs identified, will sign off. No acute needs. Pt has concerns about going back to SNF; they have been trying to work on rails for her bed there.    Yashika Mask 03/11/2018, 12:12 PM  Lesle Chris, OTR/L Acute Rehabilitation Services [phone] [facility] pager [phone] office 03/11/2018

## 2018-03-11 NOTE — Progress Notes (Signed)
Patient ID: [name], female   DOB: [birth year], 37 y.o.   MRN: [id number]  PROGRESS NOTE    [name]  VEH:[id number] DOB: [birth year] DOA: 03/07/2018 PCP: Lennie Odor, PA-C   Brief Narrative:  37 year old female with history of multiple sclerosis, transverse myelitis, paraplegia, nursing home resident, pelvic osteomyelitis, DVT presented with cough and fever.  She was found to have influenza A+.  She was treated with IV fluids and antibiotics.  She was subsequently again started on antibiotics for persistent temperature spikes.  Assessment & Plan:   Active Problems:   Paraplegia (HCC)   Multiple sclerosis (HCC)   History of transverse myelitis   Influenza A   Osteomyelitis, pelvis (HCC)   Thrombocytopenia (HCC)   Influenza A acute bronchitis -Patient has significant cough.  Finish 5-day course of Tamiflu.  Oxygen supplementation as needed  Sepsis secondary to above -Hemodynamically improving.  No temperature spikes over the last 24 hours.  Continue Tamiflu.  Antibiotic plan as below.  Off IV fluids  Persistent fevers -No temperature spike over the last 24 hours.  Antibiotics were changed on 03/09/2018.   -Cultures negative so far -CT of the chest abdomen and pelvis showed multifocal patchy opacities with bilateral pleural effusions along with chronic osteomyelitis involving the left inferior pubic ramus/ischial tuberosity -Her patchy opacities might be from viral pneumonia versus bacterial pneumonia.  Will switch to Augmentin for another 2 to 3 days  Probable UTI, present on admission -Usually cannot experience her symptoms of UTI because of her neurological condition.   -Urine culture growing VRE and Stenotrophomonas maltophilia: Unclear about the significance of these bacteria.  Might be colonizers.  Might be causing active infection.  Currently on Bactrim and Unasyn.   Rocephin was discontinued on 03/09/2018.  Because of low colony count of stenotrophomonas,  discontinue Bactrim.  Will switch Unasyn to Augmentin for the next 2 to 3 days to complete a short course of treatment for VRE.  Multiple sclerosis/transverse myelitis/paraplegia -Fall precautions.  Continue baclofen, gabapentin, diazepam  Hypokalemia -Improved.  Repeat a.m. labs  Thrombocytopenia -Questionable cause.  Monitor; no signs of bleeding  DVT prophylaxis: Lovenox Code Status: Full Family Communication: Mother at bedside Disposition Plan: Nursing home once tomorrow if no temperature spikes. Consultants: None  Procedures: None  Antimicrobials: Rocephin from 03/08/2018-03/09/2018  Tamiflu from 03/07/2018 onwards Cefepime 1 dose on 03/07/2018 Bactrim and Unasyn from 03/09/2018 onwards   Subjective: Patient seen and examined at bedside.  She is still coughing but feels a little better.  Does not feel well and did not sleep well last night.  No overnight fever or vomiting.   Objective: Vitals:   03/10/18 0606 03/10/18 1012 03/10/18 1930 03/11/18 0517  BP: (!) 99/57 101/62 (!) 91/53 114/76  Pulse: (!) 103 92 76 77  Resp: 18 18 19 19   Temp: (!) 101.6 F (38.7 C) 99.7 F (37.6 C) 98.1 F (36.7 C) 98.2 F (36.8 C)  TempSrc: Oral Oral    SpO2: 97% 99% 99% 96%  Weight:      Height:        Intake/Output Summary (Last 24 hours) at 03/11/2018 1049 Last data filed at 03/11/2018 0516 Gross per 24 hour  Intake 240 ml  Output 1400 ml  Net -1160 ml   Filed Weights   03/08/18 0320  Weight: 75.3 kg    Examination:  General exam: Appears calm and comfortable.  Looks older than stated age.  No acute distress  respiratory system: Bilateral decreased breath sounds  at bases Cardiovascular system: S1-S2 heard, rate controlled Gastrointestinal system: Abdomen is nondistended, soft and nontender. Normal bowel sounds heard.  Bladder ostomy with bag Extremities: No cyanosis; trace edema    Data Reviewed: I have personally reviewed following labs and imaging  studies  CBC: Recent Labs  Lab 03/07/18 1752 03/09/18 0439 03/10/18 0402 03/11/18 0610  WBC 8.7 6.3 4.4 3.2*  NEUTROABS 7.7 3.8 2.9 1.7  HGB 12.6 11.3* 12.6 13.3  HCT 39.4 36.8 40.2 43.1  MCV 86.6 90.4 88.0 88.0  PLT 145* 111* 138* 932*   Basic Metabolic Panel: Recent Labs  Lab 03/07/18 1752 03/09/18 0439 03/10/18 0402 03/11/18 0610  NA 135 142 140 143  K 3.6 2.8* 4.1 3.5  CL 100 107 107 105  CO2 24 27 25 26   GLUCOSE 118* 104* 112* 101*  BUN 14 5* 5* 7  CREATININE 0.57 0.43* 0.39* 0.36*  CALCIUM 8.8* 7.9* 8.1* 8.5*  MG  --  1.8 1.9 2.2   GFR: Estimated Creatinine Clearance: 95.1 mL/min (A) (by C-G formula based on SCr of 0.36 mg/dL (L)). Liver Function Tests: Recent Labs  Lab 03/07/18 1752 03/09/18 0439 03/11/18 0610  AST 37 27 34  ALT 29 20 24   ALKPHOS 70 45 46  BILITOT 0.6 0.6 0.8  PROT 6.7 5.3* 6.6  ALBUMIN 3.9 2.7* 3.5   No results for input(s): LIPASE, AMYLASE in the last 168 hours. No results for input(s): AMMONIA in the last 168 hours. Coagulation Profile: No results for input(s): INR, PROTIME in the last 168 hours. Cardiac Enzymes: No results for input(s): CKTOTAL, CKMB, CKMBINDEX, TROPONINI in the last 168 hours. BNP (last 3 results) No results for input(s): PROBNP in the last 8760 hours. HbA1C: No results for input(s): HGBA1C in the last 72 hours. CBG: No results for input(s): GLUCAP in the last 168 hours. Lipid Profile: No results for input(s): CHOL, HDL, LDLCALC, TRIG, CHOLHDL, LDLDIRECT in the last 72 hours. Thyroid Function Tests: No results for input(s): TSH, T4TOTAL, FREET4, T3FREE, THYROIDAB in the last 72 hours. Anemia Panel: No results for input(s): VITAMINB12, FOLATE, FERRITIN, TIBC, IRON, RETICCTPCT in the last 72 hours. Sepsis Labs: Recent Labs  Lab 03/07/18 1817 03/07/18 1959  LATICACIDVEN 2.68* 0.53    Recent Results (from the past 240 hour(s))  Blood Culture (routine x 2)     Status: None (Preliminary result)    Collection Time: 03/07/18  6:12 PM  Result Value Ref Range Status   Specimen Description   Final    BLOOD ARM LEFT UPPER Performed at [facility] [address], [place], [place] [address]    Special Requests   Final    BOTTLES DRAWN AEROBIC AND ANAEROBIC Blood Culture adequate volume Performed at [facility] [address], [place], [place] [address]    Culture   Final    NO GROWTH 3 DAYS Performed at [facility], [place] [address] [place], [place] [address]    Report Status PENDING  Incomplete  Urine Culture     Status: Abnormal   Collection Time: 03/07/18  7:26 PM  Result Value Ref Range Status   Specimen Description   Final    URINE, RANDOM Performed at [facility] [address] [place], [place] [address]    Special Requests   Final    NONE Performed at [facility], [place] [address] [place], [place] [address]    Culture (A)  Final    >=100,000  COLONIES/mL VANCOMYCIN RESISTANT ENTEROCOCCUS ISOLATED 10,000 COLONIES/mL STENOTROPHOMONAS MALTOPHILIA    Report Status 03/11/2018 FINAL  Final   Organism ID, Bacteria STENOTROPHOMONAS MALTOPHILIA (A)  Final   Organism ID, Bacteria VANCOMYCIN RESISTANT ENTEROCOCCUS ISOLATED (A)  Final      Susceptibility   Stenotrophomonas maltophilia - MIC*    LEVOFLOXACIN 0.5 SENSITIVE Sensitive     TRIMETH/SULFA <=20 SENSITIVE Sensitive     * 10,000 COLONIES/mL STENOTROPHOMONAS MALTOPHILIA   Vancomycin resistant enterococcus isolated - MIC*    AMPICILLIN <=2 SENSITIVE Sensitive     LEVOFLOXACIN >=8 RESISTANT Resistant     NITROFURANTOIN <=16 SENSITIVE Sensitive     VANCOMYCIN >=32 RESISTANT Resistant     LINEZOLID 2 SENSITIVE Sensitive     * >=100,000 COLONIES/mL VANCOMYCIN RESISTANT ENTEROCOCCUS ISOLATED  Respiratory Panel by PCR     Status: Abnormal   Collection Time: 03/07/18  9:58 PM  Result Value Ref Range Status   Adenovirus NOT DETECTED NOT  DETECTED Final   Coronavirus 229E NOT DETECTED NOT DETECTED Final   Coronavirus HKU1 NOT DETECTED NOT DETECTED Final   Coronavirus NL63 NOT DETECTED NOT DETECTED Final   Coronavirus OC43 NOT DETECTED NOT DETECTED Final   Metapneumovirus NOT DETECTED NOT DETECTED Final   Rhinovirus / Enterovirus NOT DETECTED NOT DETECTED Final   Influenza A H1 2009 DETECTED (A) NOT DETECTED Final   Influenza B NOT DETECTED NOT DETECTED Final   Parainfluenza Virus 1 NOT DETECTED NOT DETECTED Final   Parainfluenza Virus 2 NOT DETECTED NOT DETECTED Final   Parainfluenza Virus 3 NOT DETECTED NOT DETECTED Final   Parainfluenza Virus 4 NOT DETECTED NOT DETECTED Final   Respiratory Syncytial Virus NOT DETECTED NOT DETECTED Final   Bordetella pertussis NOT DETECTED NOT DETECTED Final   Chlamydophila pneumoniae NOT DETECTED NOT DETECTED Final   Mycoplasma pneumoniae NOT DETECTED NOT DETECTED Final    Comment: Performed at [facility], [place] [address] [place], [place] [address]  MRSA PCR Screening     Status: None   Collection Time: 03/07/18  9:58 PM  Result Value Ref Range Status   MRSA by PCR NEGATIVE NEGATIVE Final    Comment:        The GeneXpert MRSA Assay (FDA approved for NASAL specimens only), is one component of a comprehensive MRSA colonization surveillance program. It is not intended to diagnose MRSA infection nor to guide or monitor treatment for MRSA infections. Performed at [facility], [place] [address], [place], [place] [address]   Blood Culture (routine x 2)     Status: None (Preliminary result)   Collection Time: 03/08/18  4:10 AM  Result Value Ref Range Status   Specimen Description   Final    BLOOD RIGHT HAND Blood Culture results may not be optimal due to an inadequate volume of blood received in culture bottles BOTTLES DRAWN AEROBIC ONLY Performed at [facility] [address] [place], [place] [address]    Special Requests   Final     NONE Performed at [facility], [place] [address], [place], [place] [address]    Culture   Final    NO GROWTH 2 DAYS Performed at [facility] [address], [place], [place] [address]    Report Status PENDING  Incomplete         Radiology Studies: Ct Abdomen Pelvis Wo Contrast  Result Date: 03/10/2018 CLINICAL DATA:  Fever of unknown origin. Influenza A. Cough. History of MS, transverse  myelitis, paraplegia, pelvic osteomyelitis, and DVT. EXAM: CT CHEST, ABDOMEN AND PELVIS WITHOUT CONTRAST TECHNIQUE: Multidetector CT imaging of the chest, abdomen and pelvis was performed following the standard protocol without IV contrast. COMPARISON:  MR pelvis dated 02/15/2017. CT pelvis dated 02/13/2017. CT abdomen/pelvis dated 11/15/2016. FINDINGS: CT CHEST FINDINGS Cardiovascular: Heart is normal in size.  Trace pericardial fluid. No evidence of thoracic aortic aneurysm. Mediastinum/Nodes: No suspicious mediastinal lymphadenopathy. Visualized thyroid is unremarkable. Lungs/Pleura: Multifocal patchy opacities, predominantly in the posterior left upper lobe and bilateral lower lobes, suspicious for multifocal pneumonia. Small bilateral pleural effusions. No suspicious pulmonary nodules. No pneumothorax. Musculoskeletal: Visualized osseous structures are within normal limits. CT ABDOMEN PELVIS FINDINGS Hepatobiliary: Unenhanced liver is unremarkable. Status post cholecystectomy. No intrahepatic extrahepatic ductal dilatation. Pancreas: Within normal limits. Spleen: Mild splenomegaly, measuring 14.8 cm in maximal craniocaudal dimension. Adrenals/Urinary Tract: Adrenal glands are within normal limits. 5 mm nonobstructing right lower pole renal calculus (series 3/image 67). Two nonobstructing left renal calculi measuring 2 mm. Mild fullness of the bilateral renal collecting system without frank hydronephrosis. Status post right mid abdominal ileal conduit. Suspected decompressed bladder with  postsurgical changes related to prior suprapubic catheter along the lower pelvic wall. Stomach/Bowel: Stomach is within normal limits. No evidence of bowel obstruction. Normal appendix (series 3/image 95). Mild rectal stool burden. Vascular/Lymphatic: No evidence of abdominal aortic aneurysm. No suspicious abdominopelvic lymphadenopathy. Reproductive: Uterus is grossly unremarkable. No adnexal masses. Other: No abdominopelvic ascites. Musculoskeletal: Sclerosis along the left inferior pubic ramus/ischial tuberosity (series 3/image 116), likely reflecting prior/chronic osteomyelitis with adjacent soft tissue ulceration (series 3/image 120). IMPRESSION: Multifocal patchy opacities, lingular and bilateral lower lobe predominant, suspicious for pneumonia. Small bilateral pleural effusions. 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 Adjacent soft tissue ulceration. Bilateral nonobstructing renal calculi measuring up to 5 mm in the right lower pole. Fullness of the bilateral renal collecting system without frank hydronephrosis. Right mid abdominal ileal conduit. Additional stable ancillary findings as above. Electronically Signed   By: Julian Hy M.D.   On: 03/10/2018 15:58   Ct Chest Wo Contrast  Result Date: 03/10/2018 CLINICAL DATA:  Fever of unknown origin. Influenza A. Cough. History of MS, transverse myelitis, paraplegia, pelvic osteomyelitis, and DVT. EXAM: CT CHEST, ABDOMEN AND PELVIS WITHOUT CONTRAST TECHNIQUE: Multidetector CT imaging of the chest, abdomen and pelvis was performed following the standard protocol without IV contrast. COMPARISON:  MR pelvis dated 02/15/2017. CT pelvis dated 02/13/2017. CT abdomen/pelvis dated 11/15/2016. FINDINGS: CT CHEST FINDINGS Cardiovascular: Heart is normal in size.  Trace pericardial fluid. No evidence of thoracic aortic aneurysm. Mediastinum/Nodes: No suspicious mediastinal lymphadenopathy.  Visualized thyroid is unremarkable. Lungs/Pleura: Multifocal patchy opacities, predominantly in the posterior left upper lobe and bilateral lower lobes, suspicious for multifocal pneumonia. Small bilateral pleural effusions. No suspicious pulmonary nodules. No pneumothorax. Musculoskeletal: Visualized osseous structures are within normal limits. CT ABDOMEN PELVIS FINDINGS Hepatobiliary: Unenhanced liver is unremarkable. Status post cholecystectomy. No intrahepatic extrahepatic ductal dilatation. Pancreas: Within normal limits. Spleen: Mild splenomegaly, measuring 14.8 cm in maximal craniocaudal dimension. Adrenals/Urinary Tract: Adrenal glands are within normal limits. 5 mm nonobstructing right lower pole renal calculus (series 3/image 67). Two nonobstructing left renal calculi measuring 2 mm. Mild fullness of the bilateral renal collecting system without frank hydronephrosis. Status post right mid abdominal ileal conduit. Suspected decompressed bladder with postsurgical changes related to prior suprapubic catheter along the lower pelvic wall. Stomach/Bowel: Stomach is within normal limits. No evidence of bowel obstruction. Normal appendix (series 3/image  95). Mild rectal stool burden. Vascular/Lymphatic: No evidence of abdominal aortic aneurysm. No suspicious abdominopelvic lymphadenopathy. Reproductive: Uterus is grossly unremarkable. No adnexal masses. Other: No abdominopelvic ascites. Musculoskeletal: Sclerosis along the left inferior pubic ramus/ischial tuberosity (series 3/image 116), likely reflecting prior/chronic osteomyelitis with adjacent soft tissue ulceration (series 3/image 120). IMPRESSION: Multifocal patchy opacities, lingular and bilateral lower lobe predominant, suspicious for pneumonia. Small bilateral pleural effusions. 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 Adjacent soft tissue ulceration. Bilateral nonobstructing renal  calculi measuring up to 5 mm in the right lower pole. Fullness of the bilateral renal collecting system without frank hydronephrosis. Right mid abdominal ileal conduit. Additional stable ancillary findings as above. Electronically Signed   By: Julian Hy M.D.   On: 03/10/2018 15:58        Scheduled Meds: . baclofen  20 mg Oral TID  . chlorhexidine  15 mL Mouth Rinse BID  . diazepam  10 mg Oral QHS  . diazepam  5 mg Oral Daily  . enoxaparin (LOVENOX) injection  40 mg Subcutaneous QHS  . gabapentin  800 mg Oral TID  . lamoTRIgine  50 mg Oral Daily  . mouth rinse  15 mL Mouth Rinse q12n4p  . oseltamivir  75 mg Oral Daily  . pantoprazole  40 mg Oral Daily  . sertraline  100 mg Oral Daily  . sulfamethoxazole-trimethoprim  1 tablet Oral Q12H   Continuous Infusions: . ampicillin-sulbactam (UNASYN) IV 3 g (03/11/18 0508)     LOS: 3 days        Aline August, MD [facility] Pager [phone]  If 7PM-7AM, please contact night-coverage [email or web address] Password [facility] 03/11/2018, 10:49 AM

## 2018-03-12 LAB — CULTURE, BLOOD (ROUTINE X 2)
Culture: NO GROWTH
Special Requests: ADEQUATE

## 2018-03-12 LAB — CBC WITH DIFFERENTIAL/PLATELET
Abs Immature Granulocytes: 0.07 10*3/uL (ref 0.00–0.07)
BASOS PCT: 0 %
Basophils Absolute: 0 10*3/uL (ref 0.0–0.1)
EOS PCT: 2 %
Eosinophils Absolute: 0.1 10*3/uL (ref 0.0–0.5)
HCT: 38.9 % (ref 36.0–46.0)
HEMOGLOBIN: 12.4 g/dL (ref 12.0–15.0)
Immature Granulocytes: 2 %
Lymphocytes Relative: 31 %
Lymphs Abs: 1.2 10*3/uL (ref 0.7–4.0)
MCH: 27.4 pg (ref 26.0–34.0)
MCHC: 31.9 g/dL (ref 30.0–36.0)
MCV: 86.1 fL (ref 80.0–100.0)
Monocytes Absolute: 0.2 10*3/uL (ref 0.1–1.0)
Monocytes Relative: 6 %
NEUTROS PCT: 59 %
Neutro Abs: 2.2 10*3/uL (ref 1.7–7.7)
Platelets: 149 10*3/uL — ABNORMAL LOW (ref 150–400)
RBC: 4.52 MIL/uL (ref 3.87–5.11)
RDW: 14.8 % (ref 11.5–15.5)
WBC: 3.7 10*3/uL — ABNORMAL LOW (ref 4.0–10.5)
nRBC: 0.5 % — ABNORMAL HIGH (ref 0.0–0.2)

## 2018-03-12 LAB — BASIC METABOLIC PANEL
Anion gap: 11 (ref 5–15)
BUN: 8 mg/dL (ref 6–20)
CO2: 25 mmol/L (ref 22–32)
Calcium: 8.4 mg/dL — ABNORMAL LOW (ref 8.9–10.3)
Chloride: 105 mmol/L (ref 98–111)
Creatinine, Ser: 0.36 mg/dL — ABNORMAL LOW (ref 0.44–1.00)
GFR calc Af Amer: >60 mL/min (ref >60)
GFR calc non Af Amer: >60 mL/min (ref >60)
Glucose, Bld: 100 mg/dL — ABNORMAL HIGH (ref 70–99)
Potassium: 3.4 mmol/L — ABNORMAL LOW (ref 3.5–5.1)
Sodium: 141 mmol/L (ref 135–145)

## 2018-03-12 LAB — MAGNESIUM: Magnesium: 2.3 mg/dL (ref 1.7–2.4)

## 2018-03-12 MED ORDER — DIAZEPAM 5 MG PO TABS: 5.0000 mg | ORAL_TABLET | Freq: Two times a day (BID) | ORAL | 0 refills | Status: DC

## 2018-03-12 MED ORDER — BISACODYL 10 MG RE SUPP: 10.0000 mg | Freq: Every day | RECTAL | Status: DC | PRN

## 2018-03-12 MED ORDER — GUAIFENESIN-DM 100-10 MG/5ML PO SYRP: 10.0000 mL | ORAL_SOLUTION | ORAL | 0 refills | Status: DC | PRN

## 2018-03-12 MED ORDER — AMOXICILLIN-POT CLAVULANATE 875-125 MG PO TABS: 1.0000 | ORAL_TABLET | Freq: Two times a day (BID) | ORAL | 0 refills | Status: AC

## 2018-03-12 MED ORDER — POTASSIUM CHLORIDE CRYS ER 20 MEQ PO TBCR
40.0000 meq | EXTENDED_RELEASE_TABLET | Freq: Once | ORAL | Status: AC
  Administered 2018-03-12: 40 meq via ORAL
  Filled 2018-03-12: qty 2

## 2018-03-12 NOTE — Clinical Social Work Placement (Signed)
   10:22 AM Patient from [facility]. Patient to return.  LCSW confirmed return with facility. LCSW faxed dc docs to facility.   Patient will transport by [facility].   RN report #: [phone]  Jennette  NOTE  Date:  03/12/2018  Patient Details  Name: [name] MRN: [id number] Date of Birth: Oct 21, 1980  Clinical Social Work is seeking post-discharge placement for this patient at the   level of care (*CSW will initial, date and re-position this form in  chart as items are completed):      Patient/family provided with [facility] Work Department's list of facilities offering this level of care within the geographic area requested by the patient (or if unable, by the patient's family).      Patient/family informed of their freedom to choose among providers that offer the needed level of care, that participate in Medicare, Medicaid or managed care program needed by the patient, have an available bed and are willing to accept the patient.      Patient/family informed of [facility]'s ownership interest in [facility] and [facility], as well as of the fact that they are under no obligation to receive care at these facilities.  PASRR submitted to EDS on       PASRR number received on       Existing PASRR number confirmed on       FL2 transmitted to all facilities in geographic area requested by pt/family on       FL2 transmitted to all facilities within larger geographic area on       Patient informed that his/her managed care company has contracts with or will negotiate with certain facilities, including the following:            Patient/family informed of bed offers received.  Patient chooses bed at       Physician recommends and patient chooses bed at      Patient to be transferred to   on  .  Patient to be transferred to facility by       Patient family notified on   of transfer.  Name of family member  notified:        PHYSICIAN       Additional Comment:    _______________________________________________ Servando Snare, LCSW 03/12/2018, 10:22 AM

## 2018-03-12 NOTE — Discharge Summary (Signed)
Physician Discharge Summary  [name] OEV:[id number] DOB: [birth year] DOA: 03/07/2018  PCP: Lennie Odor, PA-C  Admit date: 03/07/2018 Discharge date: 03/12/2018  Admitted From: SNF Disposition:  SNF  Recommendations for Outpatient Follow-up:  1. Follow up with SNF provider at earliest convenience 2. Follow-up in the ED if symptoms worsen or new appear   Home Health: No Equipment/Devices: None Discharge Condition: Guarded CODE STATUS: Full Diet recommendation:  Regular  Brief/Interim Summary: 37 year old female with history of multiple sclerosis, transverse myelitis, paraplegia, nursing home resident, pelvic osteomyelitis, DVT presented with cough and fever.  She was found to have influenza A+.  She was treated with IV fluids and antibiotics.  She was subsequently again started on antibiotics for persistent temperature spikes.  Urine cultures grew VRE and Stenotrophomonas maltophilia.  Rocephin was switched to Bactrim and Unasyn.  Antibiotics were subsequently switched to oral Augmentin.  CT chest showed multifocal patchy opacities.  She is currently afebrile and hemodynamically stable.  She will be discharged back to SNF on 2 more days of Augmentin.  Discharge Diagnoses:  Active Problems:   Paraplegia (HCC)   Multiple sclerosis (HCC)   History of transverse myelitis   Influenza A   Osteomyelitis, pelvis (HCC)   Thrombocytopenia (HCC)  Influenza A acute bronchitis -Treated with Tamiflu.  Today is day 5 of Tamiflu. -Hemodynamically stable.  Discharge to nursing home today.  Sepsis secondary to above -Resolved.  No temperature spikes over the last 48 hours.    Antibiotic plan as below.  Off IV fluids  Persistent fevers -No temperature spike over the last 48 hours.  -Initially was started on Rocephin but subsequently changed to Bactrim and Unasyn which were subsequently changed to Augmentin on 03/11/2018.  Discharge on oral Augmentin for 2 more days -Blood cultures  negative so far -CT of the chest abdomen and pelvis showed multifocal patchy opacities with bilateral pleural effusions along with chronic osteomyelitis involving the left inferior pubic ramus/ischial tuberosity -Her patchy opacities might be from viral pneumonia versus bacterial pneumonia.   Probable UTI, present on admission -Usually cannot experience her symptoms of UTI because of her neurological condition.   -Urine culture growing VRE and Stenotrophomonas maltophilia: Unclear about the significance of these bacteria.  Might be colonizers.  Might be causing active infection.    Rocephin was switched to Bactrim and Unasyn on 03/09/2018.  Because of low colony count of stenotrophomonas, discontinued Bactrim on 03/11/2018.  switched Unasyn to Augmentin on 03/11/2018 for the next 2 to 3 days to complete a short course of treatment for VRE.  Multiple sclerosis/transverse myelitis/paraplegia -Fall precautions.  Continue baclofen, gabapentin, diazepam  Hypokalemia -Replace.  Outpatient follow-up  Thrombocytopenia -Questionable cause.  Monitor; no signs of bleeding  Discharge Instructions  Discharge Instructions    Call MD for:  difficulty breathing, headache or visual disturbances   Complete by:  As directed    Call MD for:  extreme fatigue   Complete by:  As directed    Call MD for:  hives   Complete by:  As directed    Call MD for:  persistant dizziness or light-headedness   Complete by:  As directed    Call MD for:  persistant nausea and vomiting   Complete by:  As directed    Call MD for:  severe uncontrolled pain   Complete by:  As directed    Call MD for:  temperature >100.4   Complete by:  As directed    Diet general   Complete  by:  As directed    Increase activity slowly   Complete by:  As directed      Allergies as of 03/12/2018      Reactions   Iodine Anaphylaxis   Latex Anaphylaxis   Polymyxin B Hives   Pt had hives in right arm with infusion of polymyxin on  November 26th, 2018   Shellfish-derived Products Anaphylaxis   Reaction unknown   Vancomycin Anaphylaxis   Aspirin Other (See Comments)   Reaction unknown   Strawberry Extract Hives, Rash      Medication List    TAKE these medications   acetaminophen 325 MG tablet Commonly known as:  TYLENOL Take 650 mg by mouth every 6 (six) hours as needed for fever.   amoxicillin-clavulanate 875-125 MG tablet Commonly known as:  AUGMENTIN Take 1 tablet by mouth every 12 (twelve) hours for 2 days.   baclofen 20 MG tablet Commonly known as:  LIORESAL Take 20 mg by mouth 3 (three) times daily.   bisacodyl 10 MG suppository Commonly known as:  DULCOLAX Place 1 suppository (10 mg total) rectally daily as needed for mild constipation.   CERTAGEN PO Take 1 tablet by mouth daily.   diazepam 5 MG tablet Commonly known as:  VALIUM Take 1-2 tablets (5-10 mg total) by mouth 2 (two) times daily. Take 1 tablet (5 mg) BID and Take 2 tablets (10 mg) at bedtime   diphenhydrAMINE 25 mg capsule Commonly known as:  BENADRYL Take 1 capsule (25 mg total) by mouth every 8 (eight) hours as needed for itching. What changed:  how much to take   fluticasone 50 MCG/ACT nasal spray Commonly known as:  FLONASE Place 2 sprays into the nose daily as needed for allergies.   gabapentin 400 MG capsule Commonly known as:  NEURONTIN Take 2 capsules (800 mg total) by mouth 3 (three) times daily.   guaiFENesin-dextromethorphan 100-10 MG/5ML syrup Commonly known as:  ROBITUSSIN DM Take 10 mLs by mouth every 4 (four) hours as needed for cough.   ibuprofen 200 MG tablet Commonly known as:  ADVIL,MOTRIN Take 200-400 mg by mouth every 6 (six) hours as needed for headache or moderate pain.   lamoTRIgine 25 MG tablet Commonly known as:  LAMICTAL Take 2 tablets (50 mg total) by mouth daily.   loperamide 2 MG capsule Commonly known as:  IMODIUM Take 2 capsules (4 mg total) by mouth 3 (three) times daily as needed  for diarrhea or loose stools.   loratadine 5 MG/5ML syrup Commonly known as:  CLARITIN Take 5 mg daily as needed by mouth for allergies.   omeprazole 20 MG capsule Commonly known as:  PRILOSEC Take 20 mg by mouth daily.   ondansetron 4 MG disintegrating tablet Commonly known as:  ZOFRAN-ODT Take 1 tablet (4 mg total) by mouth every 8 (eight) hours as needed for nausea or vomiting.   sertraline 100 MG tablet Commonly known as:  ZOLOFT Take 1 tablet (100 mg total) by mouth 2 (two) times daily.      Follow-up Information    Redmon, Barth Kirks, PA-C. Schedule an appointment as soon as possible for a visit in 1 week(s).   Specialty:  Nurse Practitioner Contact information: [address] [place] [address] [phone]          Allergies  Allergen Reactions  . Iodine Anaphylaxis  . Latex Anaphylaxis  . Polymyxin B Hives    Pt had hives in right arm with infusion of polymyxin on  November 26th, 2018  . Shellfish-Derived Products Anaphylaxis    Reaction unknown  . Vancomycin Anaphylaxis  . Aspirin Other (See Comments)    Reaction unknown  . Strawberry Extract Hives and Rash    Consultations:  None   Procedures/Studies: Ct Abdomen Pelvis Wo Contrast  Result Date: 03/10/2018 CLINICAL DATA:  Fever of unknown origin. Influenza A. Cough. History of MS, transverse myelitis, paraplegia, pelvic osteomyelitis, and DVT. EXAM: CT CHEST, ABDOMEN AND PELVIS WITHOUT CONTRAST TECHNIQUE: Multidetector CT imaging of the chest, abdomen and pelvis was performed following the standard protocol without IV contrast. COMPARISON:  MR pelvis dated 02/15/2017. CT pelvis dated 02/13/2017. CT abdomen/pelvis dated 11/15/2016. FINDINGS: CT CHEST FINDINGS Cardiovascular: Heart is normal in size.  Trace pericardial fluid. No evidence of thoracic aortic aneurysm. Mediastinum/Nodes: No suspicious mediastinal lymphadenopathy. Visualized thyroid is unremarkable. Lungs/Pleura: Multifocal patchy  opacities, predominantly in the posterior left upper lobe and bilateral lower lobes, suspicious for multifocal pneumonia. Small bilateral pleural effusions. No suspicious pulmonary nodules. No pneumothorax. Musculoskeletal: Visualized osseous structures are within normal limits. CT ABDOMEN PELVIS FINDINGS Hepatobiliary: Unenhanced liver is unremarkable. Status post cholecystectomy. No intrahepatic extrahepatic ductal dilatation. Pancreas: Within normal limits. Spleen: Mild splenomegaly, measuring 14.8 cm in maximal craniocaudal dimension. Adrenals/Urinary Tract: Adrenal glands are within normal limits. 5 mm nonobstructing right lower pole renal calculus (series 3/image 67). Two nonobstructing left renal calculi measuring 2 mm. Mild fullness of the bilateral renal collecting system without frank hydronephrosis. Status post right mid abdominal ileal conduit. Suspected decompressed bladder with postsurgical changes related to prior suprapubic catheter along the lower pelvic wall. Stomach/Bowel: Stomach is within normal limits. No evidence of bowel obstruction. Normal appendix (series 3/image 95). Mild rectal stool burden. Vascular/Lymphatic: No evidence of abdominal aortic aneurysm. No suspicious abdominopelvic lymphadenopathy. Reproductive: Uterus is grossly unremarkable. No adnexal masses. Other: No abdominopelvic ascites. Musculoskeletal: Sclerosis along the left inferior pubic ramus/ischial tuberosity (series 3/image 116), likely reflecting prior/chronic osteomyelitis with adjacent soft tissue ulceration (series 3/image 120). IMPRESSION: Multifocal patchy opacities, lingular and bilateral lower lobe predominant, suspicious for pneumonia. Small bilateral pleural effusions. 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 Adjacent soft tissue ulceration. Bilateral nonobstructing renal calculi measuring up to 5 mm in the right lower pole. Fullness of the  bilateral renal collecting system without frank hydronephrosis. Right mid abdominal ileal conduit. Additional stable ancillary findings as above. Electronically Signed   By: Julian Hy M.D.   On: 03/10/2018 15:58   Ct Chest Wo Contrast  Result Date: 03/10/2018 CLINICAL DATA:  Fever of unknown origin. Influenza A. Cough. History of MS, transverse myelitis, paraplegia, pelvic osteomyelitis, and DVT. EXAM: CT CHEST, ABDOMEN AND PELVIS WITHOUT CONTRAST TECHNIQUE: Multidetector CT imaging of the chest, abdomen and pelvis was performed following the standard protocol without IV contrast. COMPARISON:  MR pelvis dated 02/15/2017. CT pelvis dated 02/13/2017. CT abdomen/pelvis dated 11/15/2016. FINDINGS: CT CHEST FINDINGS Cardiovascular: Heart is normal in size.  Trace pericardial fluid. No evidence of thoracic aortic aneurysm. Mediastinum/Nodes: No suspicious mediastinal lymphadenopathy. Visualized thyroid is unremarkable. Lungs/Pleura: Multifocal patchy opacities, predominantly in the posterior left upper lobe and bilateral lower lobes, suspicious for multifocal pneumonia. Small bilateral pleural effusions. No suspicious pulmonary nodules. No pneumothorax. Musculoskeletal: Visualized osseous structures are within normal limits. CT ABDOMEN PELVIS FINDINGS Hepatobiliary: Unenhanced liver is unremarkable. Status post cholecystectomy. No intrahepatic extrahepatic ductal dilatation. Pancreas: Within normal limits. Spleen: Mild splenomegaly, measuring 14.8 cm in maximal craniocaudal dimension. Adrenals/Urinary Tract: Adrenal glands are within normal limits. 5  mm nonobstructing right lower pole renal calculus (series 3/image 67). Two nonobstructing left renal calculi measuring 2 mm. Mild fullness of the bilateral renal collecting system without frank hydronephrosis. Status post right mid abdominal ileal conduit. Suspected decompressed bladder with postsurgical changes related to prior suprapubic catheter along the lower  pelvic wall. Stomach/Bowel: Stomach is within normal limits. No evidence of bowel obstruction. Normal appendix (series 3/image 95). Mild rectal stool burden. Vascular/Lymphatic: No evidence of abdominal aortic aneurysm. No suspicious abdominopelvic lymphadenopathy. Reproductive: Uterus is grossly unremarkable. No adnexal masses. Other: No abdominopelvic ascites. Musculoskeletal: Sclerosis along the left inferior pubic ramus/ischial tuberosity (series 3/image 116), likely reflecting prior/chronic osteomyelitis with adjacent soft tissue ulceration (series 3/image 120). IMPRESSION: Multifocal patchy opacities, lingular and bilateral lower lobe predominant, suspicious for pneumonia. Small bilateral pleural effusions. 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 Adjacent soft tissue ulceration. Bilateral nonobstructing renal calculi measuring up to 5 mm in the right lower pole. Fullness of the bilateral renal collecting system without frank hydronephrosis. Right mid abdominal ileal conduit. Additional stable ancillary findings as above. Electronically Signed   By: Julian Hy M.D.   On: 03/10/2018 15:58   Dg Chest Port 1 View  Result Date: 03/07/2018 CLINICAL DATA:  Sepsis.  Tachycardia and fever. EXAM: PORTABLE CHEST 1 VIEW COMPARISON:  01/01/2018 FINDINGS: The heart size and mediastinal contours are within normal limits. Both lungs are clear. The visualized skeletal structures are unremarkable. IMPRESSION: No active disease. Electronically Signed   By: Nelson Chimes M.D.   On: 03/07/2018 18:13     Subjective: Patient seen and examined at bedside.  She feels better and ready for discharge.  No overnight fever, nausea or vomiting.  Her cough is improving.  Discharge Exam: Vitals:   03/11/18 2204 03/12/18 0534  BP: (!) 95/55 (!) 96/57  Pulse: 78 79  Resp: 18 18  Temp: 98.5 F (36.9 C) (!) 97.5 F (36.4 C)  SpO2: 95% 91%   Vitals:    03/11/18 0517 03/11/18 1453 03/11/18 2204 03/12/18 0534  BP: 114/76 117/83 (!) 95/55 (!) 96/57  Pulse: 77 80 78 79  Resp: 19  18 18   Temp: 98.2 F (36.8 C) 98.7 F (37.1 C) 98.5 F (36.9 C) (!) 97.5 F (36.4 C)  TempSrc:  Oral Oral Oral  SpO2: 96% 96% 95% 91%  Weight:      Height:        General: Pt is alert, awake, not in acute distress.  Looks older than stated age Cardiovascular: rate controlled, S1/S2 + Respiratory: bilateral decreased breath sounds at bases, scattered crackles Abdominal: Soft, NT, ND, bowel sounds +, bladder ostomy with bag Extremities: no edema, no cyanosis    The results of significant diagnostics from this hospitalization (including imaging, microbiology, ancillary and laboratory) are listed below for reference.     Microbiology: Recent Results (from the past 240 hour(s))  Blood Culture (routine x 2)     Status: None (Preliminary result)   Collection Time: 03/07/18  6:12 PM  Result Value Ref Range Status   Specimen Description   Final    BLOOD ARM LEFT UPPER Performed at [facility] [address], [place], [place] [address]    Special Requests   Final    BOTTLES DRAWN AEROBIC AND ANAEROBIC Blood Culture adequate volume Performed at [facility] [address] [place], [place] [address]    Culture   Final    NO GROWTH 4 DAYS Performed  at [facility], [place] [address], [place], [place] [address]    Report Status PENDING  Incomplete  Urine Culture     Status: Abnormal   Collection Time: 03/07/18  7:26 PM  Result Value Ref Range Status   Specimen Description   Final    URINE, RANDOM Performed at [facility] [address] [place], [place] [address]    Special Requests   Final    NONE Performed at [facility], [place] [address], [place], [place] [address]    Culture (A)  Final    >=100,000 COLONIES/mL VANCOMYCIN RESISTANT ENTEROCOCCUS ISOLATED 10,000  COLONIES/mL STENOTROPHOMONAS MALTOPHILIA    Report Status 03/11/2018 FINAL  Final   Organism ID, Bacteria STENOTROPHOMONAS MALTOPHILIA (A)  Final   Organism ID, Bacteria VANCOMYCIN RESISTANT ENTEROCOCCUS ISOLATED (A)  Final      Susceptibility   Stenotrophomonas maltophilia - MIC*    LEVOFLOXACIN 0.5 SENSITIVE Sensitive     TRIMETH/SULFA <=20 SENSITIVE Sensitive     * 10,000 COLONIES/mL STENOTROPHOMONAS MALTOPHILIA   Vancomycin resistant enterococcus isolated - MIC*    AMPICILLIN <=2 SENSITIVE Sensitive     LEVOFLOXACIN >=8 RESISTANT Resistant     NITROFURANTOIN <=16 SENSITIVE Sensitive     VANCOMYCIN >=32 RESISTANT Resistant     LINEZOLID 2 SENSITIVE Sensitive     * >=100,000 COLONIES/mL VANCOMYCIN RESISTANT ENTEROCOCCUS ISOLATED  Respiratory Panel by PCR     Status: Abnormal   Collection Time: 03/07/18  9:58 PM  Result Value Ref Range Status   Adenovirus NOT DETECTED NOT DETECTED Final   Coronavirus 229E NOT DETECTED NOT DETECTED Final   Coronavirus HKU1 NOT DETECTED NOT DETECTED Final   Coronavirus NL63 NOT DETECTED NOT DETECTED Final   Coronavirus OC43 NOT DETECTED NOT DETECTED Final   Metapneumovirus NOT DETECTED NOT DETECTED Final   Rhinovirus / Enterovirus NOT DETECTED NOT DETECTED Final   Influenza A H1 2009 DETECTED (A) NOT DETECTED Final   Influenza B NOT DETECTED NOT DETECTED Final   Parainfluenza Virus 1 NOT DETECTED NOT DETECTED Final   Parainfluenza Virus 2 NOT DETECTED NOT DETECTED Final   Parainfluenza Virus 3 NOT DETECTED NOT DETECTED Final   Parainfluenza Virus 4 NOT DETECTED NOT DETECTED Final   Respiratory Syncytial Virus NOT DETECTED NOT DETECTED Final   Bordetella pertussis NOT DETECTED NOT DETECTED Final   Chlamydophila pneumoniae NOT DETECTED NOT DETECTED Final   Mycoplasma pneumoniae NOT DETECTED NOT DETECTED Final    Comment: Performed at [facility], [place] [address], [place], [place] [address]  MRSA PCR Screening     Status: None   Collection  Time: 03/07/18  9:58 PM  Result Value Ref Range Status   MRSA by PCR NEGATIVE NEGATIVE Final    Comment:        The GeneXpert MRSA Assay (FDA approved for NASAL specimens only), is one component of a comprehensive MRSA colonization surveillance program. It is not intended to diagnose MRSA infection nor to guide or monitor treatment for MRSA infections. Performed at [facility], [place] [address] [place], [place] [address]   Blood Culture (routine x 2)     Status: None (Preliminary result)   Collection Time: 03/08/18  4:10 AM  Result Value Ref Range Status   Specimen Description   Final    BLOOD RIGHT HAND Blood Culture results may not be optimal due to an inadequate volume of blood received in culture bottles BOTTLES DRAWN AEROBIC ONLY Performed at  [facility], [place] [address] [place], [place] [address]    Special Requests   Final    NONE Performed at [facility], [place] [address] [place], [place] [address]    Culture   Final    NO GROWTH 3 DAYS Performed at [facility], [place] [address], [place], [place] [address]    Report Status PENDING  Incomplete     Labs: BNP (last 3 results) No results for input(s): BNP in the last 8760 hours. Basic Metabolic Panel: Recent Labs  Lab 03/07/18 1752 03/09/18 0439 03/10/18 0402 03/11/18 0610 03/12/18 0619  NA 135 142 140 143 141  K 3.6 2.8* 4.1 3.5 3.4*  CL 100 107 107 105 105  CO2 24 27 25 26 25   GLUCOSE 118* 104* 112* 101* 100*  BUN 14 5* 5* 7 8  CREATININE 0.57 0.43* 0.39* 0.36* 0.36*  CALCIUM 8.8* 7.9* 8.1* 8.5* 8.4*  MG  --  1.8 1.9 2.2 2.3   Liver Function Tests: Recent Labs  Lab 03/07/18 1752 03/09/18 0439 03/11/18 0610  AST 37 27 34  ALT 29 20 24   ALKPHOS 70 45 46  BILITOT 0.6 0.6 0.8  PROT 6.7 5.3* 6.6  ALBUMIN 3.9 2.7* 3.5   No results for input(s): LIPASE, AMYLASE in the last 168 hours. No results for input(s): AMMONIA in the last 168  hours. CBC: Recent Labs  Lab 03/07/18 1752 03/09/18 0439 03/10/18 0402 03/11/18 0610 03/12/18 0619  WBC 8.7 6.3 4.4 3.2* 3.7*  NEUTROABS 7.7 3.8 2.9 1.7 2.2  HGB 12.6 11.3* 12.6 13.3 12.4  HCT 39.4 36.8 40.2 43.1 38.9  MCV 86.6 90.4 88.0 88.0 86.1  PLT 145* 111* 138* 127* 149*   Cardiac Enzymes: No results for input(s): CKTOTAL, CKMB, CKMBINDEX, TROPONINI in the last 168 hours. BNP: Invalid input(s): POCBNP CBG: No results for input(s): GLUCAP in the last 168 hours. D-Dimer No results for input(s): DDIMER in the last 72 hours. Hgb A1c No results for input(s): HGBA1C in the last 72 hours. Lipid Profile No results for input(s): CHOL, HDL, LDLCALC, TRIG, CHOLHDL, LDLDIRECT in the last 72 hours. Thyroid function studies No results for input(s): TSH, T4TOTAL, T3FREE, THYROIDAB in the last 72 hours.  Invalid input(s): FREET3 Anemia work up No results for input(s): VITAMINB12, FOLATE, FERRITIN, TIBC, IRON, RETICCTPCT in the last 72 hours. Urinalysis    Component Value Date/Time   COLORURINE AMBER (A) 03/07/2018 1926   APPEARANCEUR CLEAR 03/07/2018 1926   LABSPEC 1.011 03/07/2018 1926   PHURINE 6.0 03/07/2018 1926   GLUCOSEU NEGATIVE 03/07/2018 1926   HGBUR MODERATE (A) 03/07/2018 1926   BILIRUBINUR NEGATIVE 03/07/2018 1926   KETONESUR NEGATIVE 03/07/2018 1926   PROTEINUR NEGATIVE 03/07/2018 1926   UROBILINOGEN 2.0 (H) 10/29/2014 1627   NITRITE POSITIVE (A) 03/07/2018 1926   LEUKOCYTESUR LARGE (A) 03/07/2018 1926   Sepsis Labs Invalid input(s): PROCALCITONIN,  WBC,  LACTICIDVEN Microbiology Recent Results (from the past 240 hour(s))  Blood Culture (routine x 2)     Status: None (Preliminary result)   Collection Time: 03/07/18  6:12 PM  Result Value Ref Range Status   Specimen Description   Final    BLOOD ARM LEFT UPPER Performed at [facility], [place] [address] [place], [place] [address]    Special Requests   Final    BOTTLES DRAWN AEROBIC AND  ANAEROBIC Blood Culture adequate volume Performed at [facility] [address], [place], [place] [address]  Culture   Final    NO GROWTH 4 DAYS Performed at [facility], [place] [address] [place], [place] [address]    Report Status PENDING  Incomplete  Urine Culture     Status: Abnormal   Collection Time: 03/07/18  7:26 PM  Result Value Ref Range Status   Specimen Description   Final    URINE, RANDOM Performed at [facility] [address] [place], [place] [address]    Special Requests   Final    NONE Performed at [facility], [place] [address] [place], [place] [address]    Culture (A)  Final    >=100,000 COLONIES/mL VANCOMYCIN RESISTANT ENTEROCOCCUS ISOLATED 10,000 COLONIES/mL STENOTROPHOMONAS MALTOPHILIA    Report Status 03/11/2018 FINAL  Final   Organism ID, Bacteria STENOTROPHOMONAS MALTOPHILIA (A)  Final   Organism ID, Bacteria VANCOMYCIN RESISTANT ENTEROCOCCUS ISOLATED (A)  Final      Susceptibility   Stenotrophomonas maltophilia - MIC*    LEVOFLOXACIN 0.5 SENSITIVE Sensitive     TRIMETH/SULFA <=20 SENSITIVE Sensitive     * 10,000 COLONIES/mL STENOTROPHOMONAS MALTOPHILIA   Vancomycin resistant enterococcus isolated - MIC*    AMPICILLIN <=2 SENSITIVE Sensitive     LEVOFLOXACIN >=8 RESISTANT Resistant     NITROFURANTOIN <=16 SENSITIVE Sensitive     VANCOMYCIN >=32 RESISTANT Resistant     LINEZOLID 2 SENSITIVE Sensitive     * >=100,000 COLONIES/mL VANCOMYCIN RESISTANT ENTEROCOCCUS ISOLATED  Respiratory Panel by PCR     Status: Abnormal   Collection Time: 03/07/18  9:58 PM  Result Value Ref Range Status   Adenovirus NOT DETECTED NOT DETECTED Final   Coronavirus 229E NOT DETECTED NOT DETECTED Final   Coronavirus HKU1 NOT DETECTED NOT DETECTED Final   Coronavirus NL63 NOT DETECTED NOT DETECTED Final   Coronavirus OC43 NOT DETECTED NOT DETECTED Final   Metapneumovirus NOT DETECTED NOT DETECTED Final    Rhinovirus / Enterovirus NOT DETECTED NOT DETECTED Final   Influenza A H1 2009 DETECTED (A) NOT DETECTED Final   Influenza B NOT DETECTED NOT DETECTED Final   Parainfluenza Virus 1 NOT DETECTED NOT DETECTED Final   Parainfluenza Virus 2 NOT DETECTED NOT DETECTED Final   Parainfluenza Virus 3 NOT DETECTED NOT DETECTED Final   Parainfluenza Virus 4 NOT DETECTED NOT DETECTED Final   Respiratory Syncytial Virus NOT DETECTED NOT DETECTED Final   Bordetella pertussis NOT DETECTED NOT DETECTED Final   Chlamydophila pneumoniae NOT DETECTED NOT DETECTED Final   Mycoplasma pneumoniae NOT DETECTED NOT DETECTED Final    Comment: Performed at [facility], [place] [address] [place], [place] [address]  MRSA PCR Screening     Status: None   Collection Time: 03/07/18  9:58 PM  Result Value Ref Range Status   MRSA by PCR NEGATIVE NEGATIVE Final    Comment:        The GeneXpert MRSA Assay (FDA approved for NASAL specimens only), is one component of a comprehensive MRSA colonization surveillance program. It is not intended to diagnose MRSA infection nor to guide or monitor treatment for MRSA infections. Performed at [facility], [place] [address] [place],  [address]   Blood Culture (routine x 2)     Status: None (Preliminary result)   Collection Time: 03/08/18  4:10 AM  Result Value Ref Range Status   Specimen Description   Final    BLOOD RIGHT HAND Blood Culture results may not be optimal due to an inadequate volume of  blood received in culture bottles BOTTLES DRAWN AEROBIC ONLY Performed at [place] [address], [place], [place] [address]    Special Requests   Final    NONE Performed at [facility], [place] [address] [place], [place] [address]    Culture   Final    NO GROWTH 3 DAYS Performed at [facility], [place] [address] [place], [place] [address]    Report Status PENDING  Incomplete     Time coordinating  discharge: 35 minutes  SIGNED:   Aline August, MD  [facility] 03/12/2018, 8:31 AM Pager: [phone]  If 7PM-7AM, please contact night-coverage [email or web address] Password [email or web address]

## 2018-03-12 NOTE — NC FL2 (Signed)
[place] LEVEL OF CARE SCREENING TOOL     IDENTIFICATION  Patient Name: [name] Birthdate: 23-Nov-1980 Sex: female Admission Date (Current Location): 03/07/2018  [facility] and [place] Number:  Herbalist and Address:  [facility],  [place] [address], [place]      Provider Number: [id number]  Attending Physician Name and Address:  Aline August, MD  Relative Name and Phone Number:       Current Level of Care: Hospital Recommended Level of Care: [place] Prior Approval Number:    Date Approved/Denied:   PASRR Number: [id number]  Discharge Plan: SNF    Current Diagnoses: Patient Active Problem List   Diagnosis Date Noted  . UTI (urinary tract infection) 01/01/2018  . Yeast infection 09/26/2017  . Intertrigo 04/24/2017  . Left upper extremity swelling 04/24/2017  . DVT (deep venous thrombosis) ([place]) 04/24/2017  . Hypotension due to hypovolemia   . Thrombocytopenia ([place])   . Acute kidney injury (nontraumatic) ([place])   . Sepsis associated hypotension ([place]) 03/26/2017  . AKI (acute kidney injury) ([place]) 03/26/2017  . Somnolence   . Facial droop   . MDR Acinetobacter baumannii infection   . Acinetobacter lwoffi infection   . Proteus infection   . MRSA infection   . Decubitus ulcer of left ischial area, stage III ([place]) 11/15/2016  . Diarrhea 07/17/2016  . Osteomyelitis, pelvis ([place]) 02/16/2016  . Constipation 10/29/2014  . Influenza A 03/30/2013  . Multiple sclerosis ([place]) 10/16/2011  . Paraplegia ([place])   . History of transverse myelitis     Orientation RESPIRATION BLADDER Height & Weight     Self, Time, Situation, Place  Normal Urostomy Weight: 166 lb (75.3 kg) Height:  5' 3.78" (162 cm)  BEHAVIORAL SYMPTOMS/MOOD NEUROLOGICAL BOWEL NUTRITION STATUS      Continent Diet(See dc summary)  AMBULATORY STATUS COMMUNICATION OF NEEDS Skin   Total Care Verbally Normal                        Personal Care Assistance Level of Assistance  Bathing, Feeding, Dressing Bathing Assistance: Limited assistance Feeding assistance: Independent Dressing Assistance: Limited assistance     Functional Limitations Info  Sight, Hearing, Speech Sight Info: Adequate Hearing Info: Adequate Speech Info: Adequate    SPECIAL CARE FACTORS FREQUENCY                       Contractures Contractures Info: Not present    Additional Factors Info  Code Status, Allergies Code Status Info: Full Allergies Info: Iodine, Latex, Polymyxin B, Shellfish-derived Products, Vancomycin, Aspirin, Strawberry Extract           Current Medications (03/12/2018):  This is the current hospital active medication list Current Facility-Administered Medications  Medication Dose Route Frequency Provider Last Rate Last Dose  . acetaminophen (TYLENOL) tablet 650 mg  650 mg Oral Q6H PRN Gwynne Edinger, MD   650 mg at 03/10/18 1433  . amoxicillin-clavulanate (AUGMENTIN) 875-125 MG per tablet 1 tablet  1 tablet Oral Q12H Aline August, MD   1 tablet at 03/12/18 0948  . baclofen (LIORESAL) tablet 20 mg  20 mg Oral TID Gwynne Edinger, MD   20 mg at 03/12/18 0948  . bisacodyl (DULCOLAX) suppository 10 mg  10 mg Rectal Daily PRN Schorr, Rhetta Mura, NP   10 mg at 03/12/18 0054  . chlorhexidine (PERIDEX) 0.12 % solution 15 mL  15 mL Mouth  Rinse BID Aline August, MD   15 mL at 03/12/18 0948  . diazepam (VALIUM) tablet 10 mg  10 mg Oral QHS Gwynne Edinger, MD   10 mg at 03/11/18 2122  . diazepam (VALIUM) tablet 5 mg  5 mg Oral Daily Gwynne Edinger, MD   5 mg at 03/12/18 [phone]  . enoxaparin (LOVENOX) injection 40 mg  40 mg Subcutaneous QHS Gwynne Edinger, MD   40 mg at 03/11/18 2122  . gabapentin (NEURONTIN) capsule 800 mg  800 mg Oral TID Gwynne Edinger, MD   800 mg at 03/12/18 0949  . guaiFENesin-dextromethorphan (ROBITUSSIN DM) 100-10 MG/5ML syrup 10 mL  10 mL Oral Q4H PRN Aline August,  MD   10 mL at 03/11/18 0509  . lamoTRIgine (LAMICTAL) tablet 50 mg  50 mg Oral Daily Gwynne Edinger, MD   50 mg at 03/12/18 0949  . loperamide (IMODIUM) capsule 4 mg  4 mg Oral TID PRN Gwynne Edinger, MD   4 mg at 03/10/18 1706  . MEDLINE mouth rinse  15 mL Mouth Rinse q12n4p Starla Link, Kshitiz, MD   15 mL at 03/09/18 1604  . ondansetron (ZOFRAN-ODT) disintegrating tablet 4 mg  4 mg Oral Q8H PRN Gwynne Edinger, MD   4 mg at 03/09/18 1556  . pantoprazole (PROTONIX) EC tablet 40 mg  40 mg Oral Daily Gwynne Edinger, MD   40 mg at 03/12/18 0949  . sertraline (ZOLOFT) tablet 100 mg  100 mg Oral Daily Gwynne Edinger, MD   100 mg at 03/12/18 9629     Discharge Medications: Please see discharge summary for a list of discharge medications.  Relevant Imaging Results:  Relevant Lab Results:   Additional Information SSN: [id number]  Servando Snare, LCSW

## 2018-03-12 NOTE — Progress Notes (Signed)
Report given to [facility], LPN at [facility]. No questions or concerns at this time. Patient is being transferred via [place]. Mother at bedside.

## 2018-03-13 LAB — CULTURE, BLOOD (ROUTINE X 2): CULTURE: NO GROWTH

## 2018-05-14 NOTE — Progress Notes (Signed)
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  UPDATE: Medications: gabapentin 800mg  three times daily; diazepam 5mg  twice daily, baclofen 20mg  three times daily, lamotrigine 50mg  daily, sertraline100mg  twice daily     Since last visit, she has been hospitalized on 2 occasions for sepsis.  Pain is controlled.  She has difficulty turning in bed.  HISTORY: In 2005, she developed acute transverse myelitis.  She woke up with severe neck and back pain with weakness in the arms and legs, as well as bowel and bladder retention.  MRI of cervical spine showed abnormal signal abnormality and cord swelling from C4 through C7 with abnormal enhancement at the C4-5 level.  MRI of thoracic spine showed upper thoracic cord involvement at TI and T2.  She underwent lumbar puncture and was treated with 3 days of IV Solu-Medrol, followed by oral steroids.    She is paraplegic, unable to move below the waist.  She has both bowel and urinary retention.  She has right greater than left upper extremity weakness.  She has a urostomy pouch and requires catheterizations.  She uses stool softeners for constipation.  She takes Diazepam and Baclofen for muscle spasms, as well as gabapentin.  She has chronic sacral decubitus ulcer for which she is followed by wound care.  She reports daytime somnolence.  She sees psychiatry for depression and PTSD.  A follow up MRI of the neuroaxis with and without contrast was performed on 03/24/15, which was personally reviewed.  The brain showed slight cerebral volume loss but no demyelinating lesions.  The cervical and thoracic spinal cord showed atrophy extending from C6 to T2, greatest at C7-T1.  There is a left central disc extrusion at C5-6 resulting in cord flattening with new T2 signal at  this level but no abnormal enhancement.  PAST MEDICAL HISTORY: Past Medical History:  Diagnosis Date  . Bladder stones    Hx of  . Depression   . GERD (gastroesophageal reflux disease)   . Intertrigo 04/24/2017  . Left upper extremity swelling 04/24/2017  . Multiple sclerosis ([place])   . Paraplegia ([place])   . PTSD (post-traumatic stress disorder)   . Transverse myelitis ([place])   . Yeast infection 09/26/2017    MEDICATIONS: Current Outpatient Medications on File Prior to Visit  Medication Sig Dispense Refill  . acetaminophen (TYLENOL) 325 MG tablet Take 650 mg by mouth every 6 (six) hours as needed for fever.    . baclofen (LIORESAL) 20 MG tablet Take 20 mg by mouth 3 (three) times daily.    . bisacodyl (DULCOLAX) 10 MG suppository Place 1 suppository (10 mg total) rectally daily as needed for mild constipation.    . diazepam (VALIUM) 5 MG tablet Take 1-2 tablets (5-10 mg total) by mouth 2 (two) times daily. Take 1 tablet (5 mg) BID and Take 2 tablets (10 mg) at bedtime 14 tablet 0  . diphenhydrAMINE (BENADRYL) 25 mg capsule Take 1 capsule (25 mg total) by mouth every 8 (eight) hours as needed for itching. (Patient taking differently: Take 50 mg by mouth every 8 (eight) hours as needed for itching. ) 30 capsule 0  . fluticasone (FLONASE) 50 MCG/ACT nasal spray Place 2 sprays into the nose daily as needed for allergies.     Marland Kitchen gabapentin (NEURONTIN) 400 MG capsule Take 2 capsules (800 mg total)  by mouth 3 (three) times daily. 180 capsule 8  . guaiFENesin-dextromethorphan (ROBITUSSIN DM) 100-10 MG/5ML syrup Take 10 mLs by mouth every 4 (four) hours as needed for cough. 118 mL 0  . ibuprofen (ADVIL,MOTRIN) 200 MG tablet Take 200-400 mg by mouth every 6 (six) hours as needed for headache or moderate pain.     Marland Kitchen lamoTRIgine (LAMICTAL) 25 MG tablet Take 2 tablets (50 mg total) by mouth daily. 60 tablet 0  . loperamide (IMODIUM) 2 MG capsule Take 2 capsules (4 mg total) by mouth 3 (three) times daily  as needed for diarrhea or loose stools. 30 capsule 0  . loratadine (CLARITIN) 5 MG/5ML syrup Take 5 mg daily as needed by mouth for allergies.     . Multiple Vitamins-Minerals (CERTAGEN PO) Take 1 tablet by mouth daily.    Marland Kitchen omeprazole (PRILOSEC) 20 MG capsule Take 20 mg by mouth daily.    . ondansetron (ZOFRAN-ODT) 4 MG disintegrating tablet Take 1 tablet (4 mg total) by mouth every 8 (eight) hours as needed for nausea or vomiting. 20 tablet 0  . sertraline (ZOLOFT) 100 MG tablet Take 1 tablet (100 mg total) by mouth 2 (two) times daily. 60 tablet 0   No current facility-administered medications on file prior to visit.     ALLERGIES: Allergies  Allergen Reactions  . Iodine Anaphylaxis  . Latex Anaphylaxis  . Polymyxin B Hives    Pt had hives in right arm with infusion of polymyxin on November 26th, 2018  . Shellfish-Derived Products Anaphylaxis    Reaction unknown  . Vancomycin Anaphylaxis  . Aspirin Other (See Comments)    Reaction unknown  . Strawberry Extract Hives and Rash    FAMILY HISTORY: Family History  Problem Relation Age of Onset  . Diabetes type II Father   . CAD Father   . Dementia Father   . Stroke Father   . Seizures Father   . Diabetes type II Mother   . Hypertension Mother   . Breast cancer Other   . Diabetes type II Other    SOCIAL HISTORY: Social History   Socioeconomic History  . Marital status: Single    Spouse name: Not on file  . Number of children: Not on file  . Years of education: Not on file  . Highest education level: Not on file  Occupational History  . Not on file  Social Needs  . Financial resource strain: Not hard at all  . Food insecurity:    Worry: Never true    Inability: Never true  . Transportation needs:    Medical: No    Non-medical: No  Tobacco Use  . Smoking status: Never Smoker  . Smokeless tobacco: Never Used  Substance and Sexual Activity  . Alcohol use: No  . Drug use: No  . Sexual activity: Not Currently     Birth control/protection: Abstinence  Lifestyle  . Physical activity:    Days per week: 0 days    Minutes per session: 0 min  . Stress: To some extent  Relationships  . Social connections:    Talks on phone: More than three times a week    Gets together: Never    Attends religious service: More than 4 times per year    Active member of club or organization: Yes    Attends meetings of clubs or organizations: 1 to 4 times per year    Relationship status: Never married  . Intimate partner violence:    Fear  of current or ex partner: No    Emotionally abused: No    Physically abused: No    Forced sexual activity: No  Other Topics Concern  . Not on file  Social History Narrative   Lives with parents.  All disabled.  Attended [employer] until she got transverse myelitis.    REVIEW OF SYSTEMS: Constitutional: No fevers, chills, or sweats, no generalized fatigue, change in appetite Eyes: No visual changes, double vision, eye pain Ear, nose and throat: No hearing loss, ear pain, nasal congestion, sore throat Cardiovascular: No chest pain, palpitations Respiratory:  No shortness of breath at rest or with exertion, wheezes GastrointestinaI: fecal incontinence Genitourinary:  Neurogenic bladder Musculoskeletal:  Back pain Integumentary: No rash, pruritus, skin lesions Neurological: as above Psychiatric: No depression, insomnia, anxiety Endocrine: No palpitations, fatigue, diaphoresis, mood swings, change in appetite, change in weight, increased thirst Hematologic/Lymphatic:  No purpura, petechiae. Allergic/Immunologic: no itchy/runny eyes, nasal congestion, recent allergic reactions, rashes  PHYSICAL EXAM: Blood pressure 108/68, pulse 77, height 5\' 3"  (1.6 m), weight 164 lb (74.4 kg), SpO2 95 %. General: No acute distress.  Patient appears well-groomed.  Head:  Normocephalic/atraumatic Eyes:  Fundi examined but not visualized Neck: supple, no paraspinal tenderness, full range of  motion Heart:  Regular rate and rhythm Lungs:  Clear to auscultation bilaterally Back: No paraspinal tenderness Neurological Exam: alert and oriented to person, place, and time. Attention span and concentration intact, recent and remote memory intact, fund of knowledge intact.  Speech fluent and not dysarthric, language intact.  CN II-XII intact.  Decreased muscle bulk in left upper extremity and bilateral lower extremities.  Increased tone in left upper and both lower extremities.  Muscle strength 5/5 right deltoid, both biceps, both triceps, both wrist and finger flexors and extensors.  4/5 left deltoid, left hand grip.  0/5 lower extremities.  Sensation to light touch reduced in lower extremities.  Deep tendon reflexes 3+ throughout, slightly more brisk on the left, toes downgoing.  Finger-to-nose testing intact.  Nonambulatory.  IMPRESSION: 1.  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  Continue gabapentin and baclofen 2.  Recommend rails for her bed for safety and to assist in turning. 3.  Follow up in one year.  19 minutes spent face to face with patient, over 50% spent discussing management.  Metta Clines, DO  CC: Lennie Odor, PA-C

## 2018-05-16 ENCOUNTER — Ambulatory Visit (INDEPENDENT_AMBULATORY_CARE_PROVIDER_SITE_OTHER): Payer: Medicaid Other | Admitting: Neurology

## 2018-05-16 ENCOUNTER — Encounter: Payer: Self-pay | Admitting: Neurology

## 2018-05-16 VITALS — BP 108/68 | HR 77 | Ht 63.0 in | Wt 164.0 lb

## 2018-05-16 DIAGNOSIS — G373 Acute transverse myelitis in demyelinating disease of central nervous system: Secondary | ICD-10-CM

## 2018-05-16 DIAGNOSIS — G8220 Paraplegia, unspecified: Secondary | ICD-10-CM

## 2018-05-16 NOTE — Patient Instructions (Signed)
1.  Recommend rails for bed 2.  Gabapentin, baclofen 3.  Follow up in one year

## 2018-11-24 ENCOUNTER — Other Ambulatory Visit: Payer: Self-pay | Admitting: Internal Medicine

## 2018-11-24 DIAGNOSIS — N6320 Unspecified lump in the left breast, unspecified quadrant: Secondary | ICD-10-CM

## 2018-11-25 ENCOUNTER — Other Ambulatory Visit: Payer: Self-pay

## 2018-11-25 ENCOUNTER — Ambulatory Visit
Admission: RE | Admit: 2018-11-25 | Discharge: 2018-11-25 | Disposition: A | Discharge status: Home / Self Care | Payer: Medicaid Other | Source: Ambulatory Visit | Attending: Internal Medicine | Admitting: Internal Medicine

## 2018-11-25 DIAGNOSIS — N6320 Unspecified lump in the left breast, unspecified quadrant: Secondary | ICD-10-CM

## 2019-02-09 ENCOUNTER — Telehealth: Payer: Self-pay | Admitting: Neurology

## 2019-02-09 NOTE — Telephone Encounter (Signed)
Called spoke with Sabrina at [facility] received fax number to send orders to change dose in medication. Was transferred to nurse hall to given verbal to nurse no answer phone just rings.   Will also call patient to make her aware

## 2019-02-09 NOTE — Telephone Encounter (Signed)
Pt return call to office she states she would like to have her medication adjusted. She is having increase leg spasm.  Current medication by Dr. Tomi Likens: Baclofen, gabapentin, diazepam    Patient lives at a facility: [facility] [phone]

## 2019-02-09 NOTE — Telephone Encounter (Signed)
Called patient no answer left message to call office back to discuss below. Unclear what is needed.

## 2019-02-09 NOTE — Telephone Encounter (Signed)
Patient left msg with after hours about her medication was rearranged because they were making her so sleepy. She started having spasms all day. This started a few days ago. Pt is currently in the in-patient unit at [facility]. Pt was encouraged to discuss with the nurses there and have nurses page the facility's MD. Thanks!

## 2019-02-09 NOTE — Telephone Encounter (Signed)
Can increase baclofen from 20mg  three times daily to 20mg  four times daily.

## 2019-02-12 ENCOUNTER — Telehealth: Payer: Self-pay | Admitting: Neurology

## 2019-02-12 NOTE — Telephone Encounter (Signed)
[name] called regarding her Baclofen medication and the increase to 4 x a day. They are needing a new order faxed to them at [phone]. Thank you

## 2019-02-12 NOTE — Telephone Encounter (Signed)
Attention [name]

## 2019-02-13 MED ORDER — BACLOFEN 20 MG PO TABS: 20.0000 mg | ORAL_TABLET | Freq: Four times a day (QID) | ORAL | 5 refills | Status: AC

## 2019-02-13 NOTE — Telephone Encounter (Signed)
RX for 4 times a day printed/ signed And fax back to [facility]

## 2019-03-16 ENCOUNTER — Telehealth: Payer: Self-pay | Admitting: Neurology

## 2019-03-16 NOTE — Telephone Encounter (Signed)
Yes, I think it should be okay.

## 2019-03-16 NOTE — Telephone Encounter (Signed)
Patient called needing to ask Dr. Tomi Likens if the new vaccine would be safe for her to take? Please Call. Thank you

## 2019-03-16 NOTE — Telephone Encounter (Signed)
Spoke with patient she was informed of provider response below.

## 2019-03-16 NOTE — Telephone Encounter (Signed)
Ok to for new covid vaccine?

## 2019-04-27 ENCOUNTER — Telehealth: Payer: Self-pay | Admitting: Neurology

## 2019-04-27 NOTE — Telephone Encounter (Signed)
Patient is wanting to know if she should get the second round of the COVID vaccine. Thanks!

## 2019-04-27 NOTE — Telephone Encounter (Signed)
Sure.please let her know, if she got the first one no problem,

## 2019-05-14 ENCOUNTER — Encounter: Payer: Self-pay | Admitting: Neurology

## 2019-05-14 NOTE — Progress Notes (Signed)
Due to the COVID-19 crisis, this virtual check-in visit was done via telephone from my office and it was initiated and consent given by this patient and or family.   Telephone (Audio) Visit The purpose of this telephone visit is to provide medical care while limiting exposure to the novel coronavirus.    Consent was obtained for telephone visit and initiated by pt/family:  Yes.   Answered questions that patient had about telehealth interaction:  Yes.   I discussed the limitations, risks, security and privacy concerns of performing an evaluation and management service by telephone. I also discussed with the patient that there may be a patient responsible charge related to this service. The patient expressed understanding and agreed to proceed.  Pt location: Home Physician Location: office Name of referring provider:  Lennie Odor, [place] I connected with .[name] at patients initiation/request on 05/18/2019 at 10:10 AM EST by telephone and verified that I am speaking with the correct person using two identifiers.  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  UPDATE: Medications: gabapentin 800mg  three times daily; diazepam 5mg twice daily, baclofen 20mg  three times daily, lamotrigine 50mg  daily, sertraline100mg  twice daily  She states she is more irritable.  Her prescribing physician is looking into dose adjustment.  Pain is overall stable with her gabapentin and baclofen.  She received her second Covid-19 vaccine dose last week.  She had a sore arm and low-grade fever for a couple of days but is now feeling well.  She is having  therapy for her arms.  She would like some therapy for her legs as well.  HISTORY: In 2005, she developed acute transverse myelitis. She woke up with severe neck and back  pain with weakness in the arms and legs, as well as bowel and bladder retention. MRI of cervical spine showed abnormal signal abnormality and cord swelling from C4 through C7 with abnormal enhancement at the C4-5 level. MRI of thoracic spine showed upper thoracic cord involvement at TI and T2. She underwent lumbar puncture and was treated with 3 days of IV Solu-Medrol, followed by oral steroids.   She is paraplegic, unable to move below the waist. She has both bowel and urinary retention. She has right greater than left upper extremity weakness.  She has a urostomy pouch and requires catheterizations. She uses stool softeners for constipation. She takes Diazepam and Baclofen for muscle spasms, as well as gabapentin. She has chronic sacral decubitus ulcer for which she is followed by wound care. She reports daytime somnolence. She sees psychiatry for depression and PTSD.  A follow up MRI of the neuroaxis with and without contrast was performed on 03/24/15, which was personally reviewed. The brain showed slight cerebral volume loss but no demyelinating lesions. The cervical and thoracic spinal cord showed atrophy extending from C6 to T2, greatest at C7-T1. There is a left central disc extrusion at C5-6 resulting in cord flattening with new T2 signal at this level but no abnormal enhancement.  Past Medical History: Past Medical History:  Diagnosis Date  . Bladder stones    Hx of  . Depression   . GERD (gastroesophageal reflux disease)   . Intertrigo 04/24/2017  . Left upper extremity swelling 04/24/2017  . Multiple sclerosis ([place])   . Paraplegia ([place])   . PTSD (post-traumatic stress disorder)   .  Transverse myelitis ([place])   . Yeast infection 09/26/2017    Medications: Outpatient Encounter Medications as of 05/18/2019  Medication Sig  . acetaminophen (TYLENOL) 325 MG tablet Take 650 mg by mouth every 6 (six) hours as needed for fever.  . baclofen (LIORESAL) 20 MG tablet Take 1  tablet (20 mg total) by mouth 4 (four) times daily.  . bisacodyl (DULCOLAX) 10 MG suppository Place 1 suppository (10 mg total) rectally daily as needed for mild constipation.  . diazepam (VALIUM) 5 MG tablet Take 1-2 tablets (5-10 mg total) by mouth 2 (two) times daily. Take 1 tablet (5 mg) BID and Take 2 tablets (10 mg) at bedtime  . diphenhydrAMINE (BENADRYL) 25 mg capsule Take 1 capsule (25 mg total) by mouth every 8 (eight) hours as needed for itching. (Patient taking differently: Take 50 mg by mouth every 8 (eight) hours as needed for itching. )  . fluticasone (FLONASE) 50 MCG/ACT nasal spray Place 2 sprays into the nose daily as needed for allergies.   Marland Kitchen gabapentin (NEURONTIN) 400 MG capsule Take 2 capsules (800 mg total) by mouth 3 (three) times daily.  Marland Kitchen guaiFENesin-dextromethorphan (ROBITUSSIN DM) 100-10 MG/5ML syrup Take 10 mLs by mouth every 4 (four) hours as needed for cough.  Marland Kitchen ibuprofen (ADVIL,MOTRIN) 200 MG tablet Take 200-400 mg by mouth every 6 (six) hours as needed for headache or moderate pain.   Marland Kitchen lamoTRIgine (LAMICTAL) 25 MG tablet Take 2 tablets (50 mg total) by mouth daily.  Marland Kitchen loperamide (IMODIUM) 2 MG capsule Take 2 capsules (4 mg total) by mouth 3 (three) times daily as needed for diarrhea or loose stools.  Marland Kitchen loratadine (CLARITIN) 5 MG/5ML syrup Take 5 mg daily as needed by mouth for allergies.   . Multiple Vitamins-Minerals (CERTAGEN PO) Take 1 tablet by mouth daily.  Marland Kitchen omeprazole (PRILOSEC) 20 MG capsule Take 20 mg by mouth daily.  . ondansetron (ZOFRAN-ODT) 4 MG disintegrating tablet Take 1 tablet (4 mg total) by mouth every 8 (eight) hours as needed for nausea or vomiting.  . sertraline (ZOLOFT) 100 MG tablet Take 1 tablet (100 mg total) by mouth 2 (two) times daily.   No facility-administered encounter medications on file as of 05/18/2019.    Allergies: Allergies  Allergen Reactions  . Iodine Anaphylaxis  . Latex Anaphylaxis  . Polymyxin B Hives    Pt had hives  in right arm with infusion of polymyxin on November 26th, 2018  . Shellfish-Derived Products Anaphylaxis    Reaction unknown  . Vancomycin Anaphylaxis  . Aspirin Other (See Comments)    Reaction unknown  . Strawberry Extract Hives and Rash    Family History: Family History  Problem Relation Age of Onset  . Diabetes type II Father   . CAD Father   . Dementia Father   . Stroke Father   . Seizures Father   . Diabetes type II Mother   . Hypertension Mother   . Breast cancer Other   . Diabetes type II Other     Social History: Social History   Socioeconomic History  . Marital status: Single    Spouse name: Not on file  . Number of children: Not on file  . Years of education: Not on file  . Highest education level: Not on file  Occupational History  . Not on file  Tobacco Use  . Smoking status: Never Smoker  . Smokeless tobacco: Never Used  Substance and Sexual Activity  . Alcohol use: No  . Drug  use: No  . Sexual activity: Not Currently    Birth control/protection: Abstinence  Other Topics Concern  . Not on file  Social History Narrative   Lives with parents.  All disabled.  Attended [employer] until she got transverse myelitis.   Social Determinants of Health   Financial Resource Strain:   . Difficulty of Paying Living Expenses: Not on file  Food Insecurity:   . Worried About Charity fundraiser in the Last Year: Not on file  . Ran Out of Food in the Last Year: Not on file  Transportation Needs:   . Lack of Transportation (Medical): Not on file  . Lack of Transportation (Non-Medical): Not on file  Physical Activity:   . Days of Exercise per Week: Not on file  . Minutes of Exercise per Session: Not on file  Stress:   . Feeling of Stress : Not on file  Social Connections:   . Frequency of Communication with Friends and Family: Not on file  . Frequency of Social Gatherings with Friends and Family: Not on file  . Attends Religious Services: Not on file  . Active  Member of Clubs or Organizations: Not on file  . Attends Archivist Meetings: Not on file  . Marital Status: Not on file  Intimate Partner Violence:   . Fear of Current or Ex-Partner: Not on file  . Emotionally Abused: Not on file  . Physically Abused: Not on file  . Sexually Abused: Not on file    Observations/Objective:   Height 5\' 3"  (1.6 m), weight 185 lb (83.9 kg). No acute distress.  Alert and oriented.  Speech fluent and not dysarthric.  Language intact.    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  Gabapentin 800mg  three times daily and baclofen 20mg  four times daily 2.  Physical therapy for lower extremities 3.  Follow up in one year.  Follow Up Instructions:    -I discussed the assessment and treatment plan with the patient. The patient was provided an opportunity to ask questions and all were answered. The patient agreed with the plan and demonstrated an understanding of the instructions.   The patient was advised to call back or seek an in-person evaluation if the symptoms worsen or if the condition fails to improve as anticipated.    Total Time spent in visit with the patient was:  9 minutes  Dudley Major, DO

## 2019-05-17 IMAGING — DX DG ABDOMEN 1V
1 series · 1 of 1 positions shown · non-contrast
Comparison: Abdominal and pelvic CT scan November 15, 2016

CLINICAL DATA: Patient has experience nausea and vomiting last
night and this morning. Patient is complaining of constipation and
abdominal distention. The patient has multiple scleroses and
paraplegia.

EXAM:
ABDOMEN - 1 VIEW

[abdomen kub]
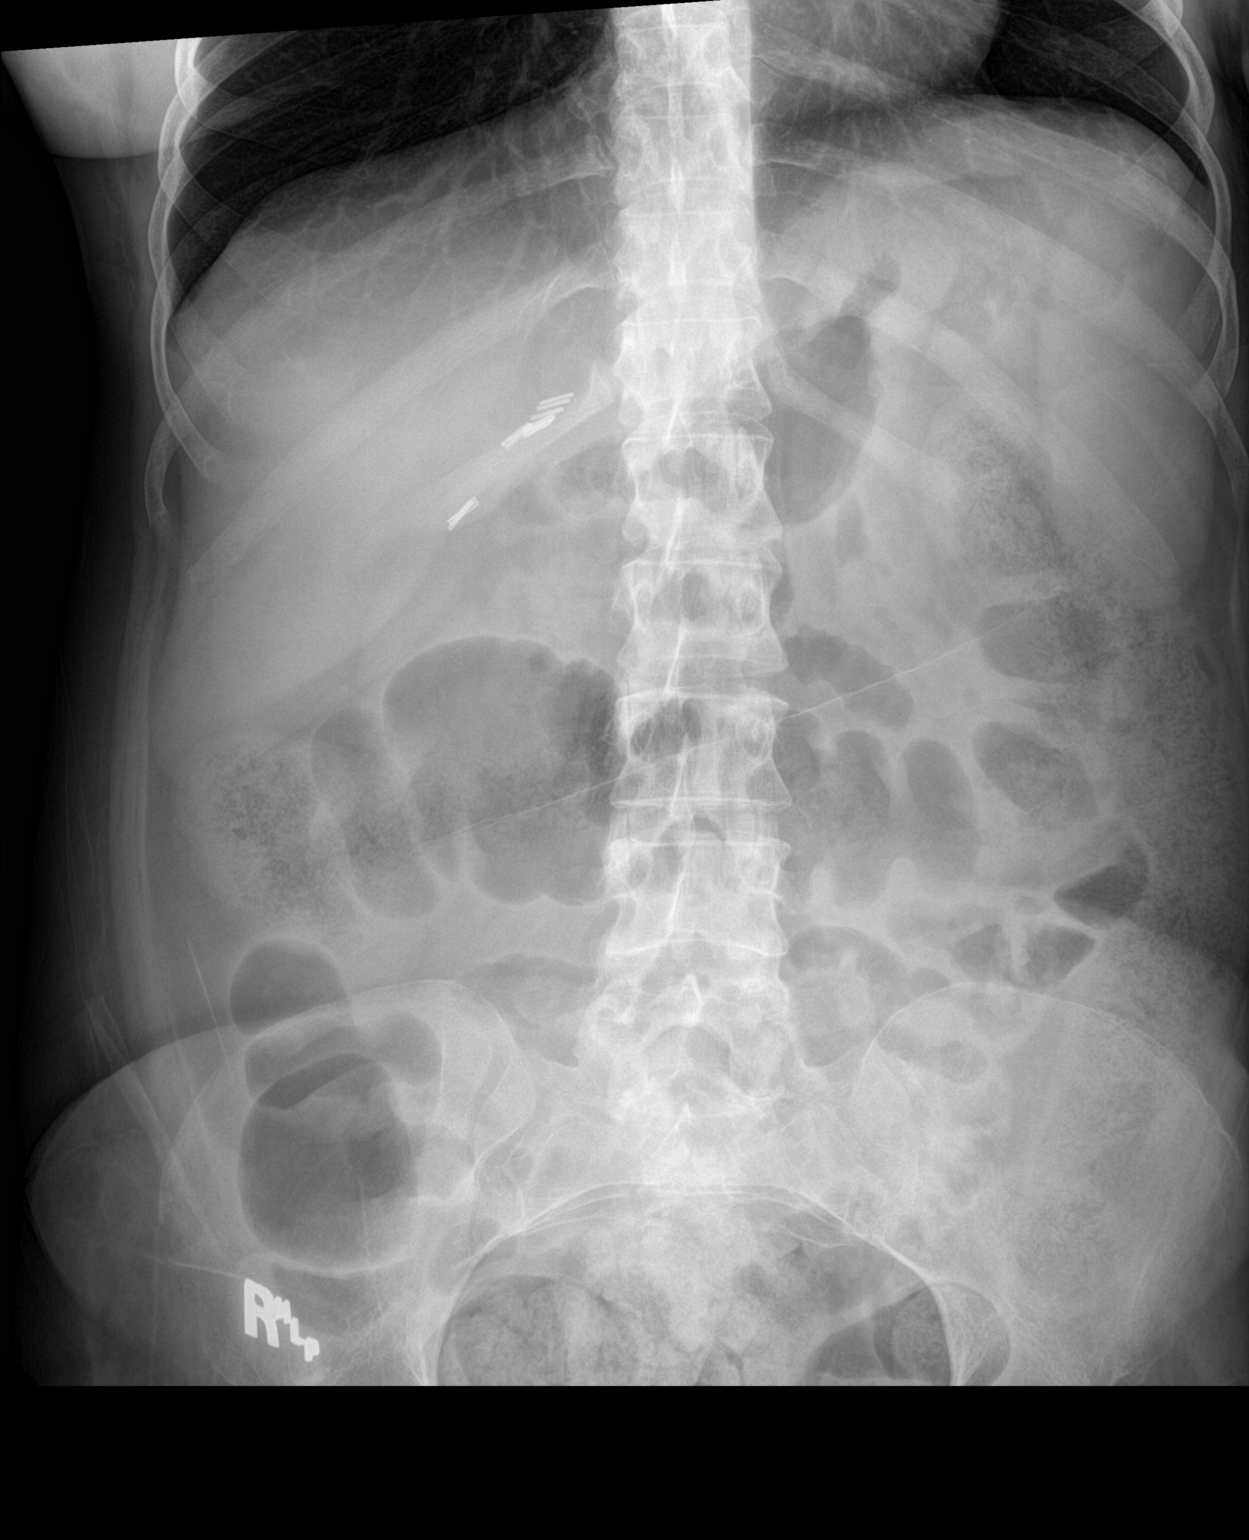

[1 of 1 positions shown; findings below may reference images not displayed]

FINDINGS: The stool burden within the transverse, descending, and rectosigmoid
portions of the colon is increased. There is no small bowel
obstructive pattern. There surgical clips in the gallbladder fossa.
There are no abnormal soft tissue calcifications. The bony
structures exhibit no acute abnormalities.
IMPRESSION: Increased colonic stool burden is consistent with constipation in
the appropriate clinical setting. Otherwise no acute intra-abdominal
abnormality is observed.

## 2019-05-18 ENCOUNTER — Other Ambulatory Visit: Payer: Self-pay

## 2019-05-18 ENCOUNTER — Telehealth (INDEPENDENT_AMBULATORY_CARE_PROVIDER_SITE_OTHER): Payer: Medicaid Other | Admitting: Neurology

## 2019-05-18 VITALS — Ht 63.0 in | Wt 185.0 lb

## 2019-05-18 DIAGNOSIS — G373 Acute transverse myelitis in demyelinating disease of central nervous system: Secondary | ICD-10-CM

## 2019-05-18 DIAGNOSIS — G8220 Paraplegia, unspecified: Secondary | ICD-10-CM

## 2019-05-19 ENCOUNTER — Telehealth: Payer: Self-pay

## 2019-05-19 IMAGING — DX DG CHEST 1V PORT
1 series · 1 of 1 positions shown · non-contrast
Comparison: 11/14/2016 chest radiograph.

CLINICAL DATA: 36 y/o  F; 1 hour of chest pain.

EXAM:
PORTABLE CHEST 1 VIEW

[chest ap]
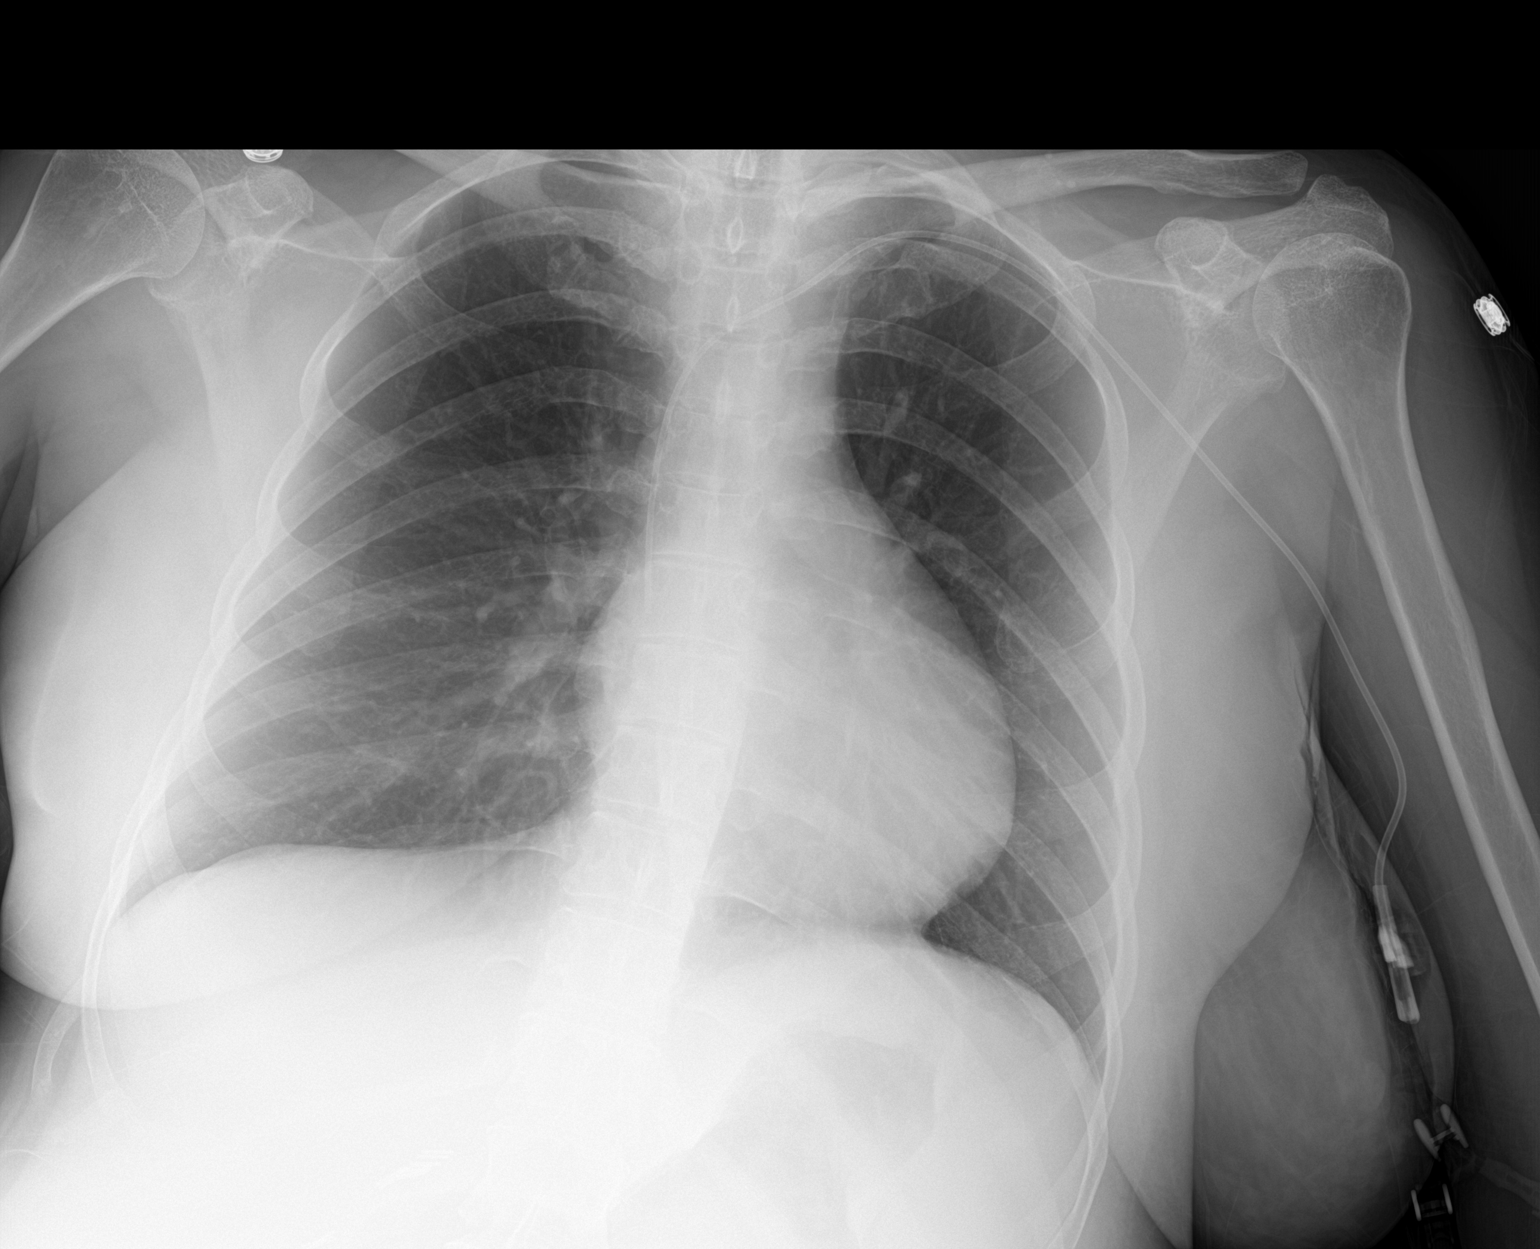

[1 of 1 positions shown; findings below may reference images not displayed]

FINDINGS: Stable heart size and mediastinal contours are within normal limits.
Left central venous catheter tip projects over the lower SVC. Both
lungs are clear. The visualized skeletal structures are
unremarkable. Right upper quadrant cholecystectomy clips.
IMPRESSION: No active disease.

By: Radiva Cantigue M.D.

## 2019-05-19 NOTE — Telephone Encounter (Signed)
[facility],orders given to physical therapy for rehab [phone], faxed to [phone].

## 2019-06-08 ENCOUNTER — Telehealth: Payer: Self-pay | Admitting: Neurology

## 2019-06-08 NOTE — Telephone Encounter (Signed)
I asked patient to get with her Rehab center and let them work on this. Patient notified.

## 2019-06-08 NOTE — Telephone Encounter (Signed)
Left message with the after hour service on 06-05-19 @ 4:33 pm   Pt is in a health and rehab center needing to speak to the DR about a good therapy chair

## 2019-09-15 ENCOUNTER — Telehealth: Payer: Self-pay | Admitting: Neurology

## 2019-09-15 NOTE — Telephone Encounter (Signed)
Noted  

## 2019-09-15 NOTE — Telephone Encounter (Signed)
FYI

## 2019-09-15 NOTE — Telephone Encounter (Signed)
Patient called to let Dr. Tomi Likens know she has increased her Lamictal from 50 MG to 75 MG twice a day. FYI only.

## 2019-11-12 ENCOUNTER — Telehealth: Payer: Self-pay | Admitting: Neurology

## 2019-11-12 NOTE — Telephone Encounter (Signed)
If she would like to pursue this, we can refer her to physical medicine and rehab for their opinion.

## 2019-11-12 NOTE — Telephone Encounter (Signed)
Pt advised of note.

## 2019-11-12 NOTE — Telephone Encounter (Signed)
Patient was informed by PT about a pump she could get that does the same as the medication without the side effects. She is wanting to know if that is something she could try and if it would be covered? Please call.

## 2019-11-12 NOTE — Telephone Encounter (Signed)
Telephone call back to pt, Pt states she was told that Baclofen can be given thru a pump or if comes ina pump form. She also wanted to if that make a difference in taking it due to progression of her condition.   Pt wanted to know what DR. Tomi Likens thinks

## 2020-05-16 NOTE — Progress Notes (Signed)
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  UPDATE: Medications: gabapentin 800mg  three times daily; diazepam 5mg twice daily,baclofen20mg  three times daily, lamotrigine 50mg , sertraline100mg  twice daily  She has not gotten the baclofen pump.  Overall, pain is controlled but does have spasms.  HISTORY: In 2005, she developed acute transverse myelitis. She woke up with severe neck and back pain with weakness in the arms and legs, as well as bowel and bladder retention. MRI of cervical spine showed abnormal signal abnormality and cord swelling from C4 through C7 with abnormal enhancement at the C4-5 level. MRI of thoracic spine showed upper thoracic cord involvement at TI and T2. She underwent lumbar puncture and was treated with 3 days of IV Solu-Medrol, followed by oral steroids.   She is paraplegic, unable to move below the waist. She has both bowel and urinary retention. She has right greater than left upper extremity weakness.  She has a urostomy pouch and requires catheterizations. She uses stool softeners for constipation. She takes Diazepam and Baclofen for muscle spasms, as well as gabapentin. She has chronic sacral decubitus ulcer for which she is followed by wound care. She reports daytime somnolence. She sees psychiatry for depression and PTSD.  A follow up MRI of the neuroaxis with and without contrast was performed on 03/24/15, which was personally reviewed. The brain showed slight cerebral volume loss but no demyelinating lesions. The cervical and thoracic spinal cord showed atrophy extending from C6 to T2, greatest at C7-T1. There is a left central disc extrusion at C5-6 resulting in cord flattening with new T2 signal at this level but no abnormal enhancement.  PAST  MEDICAL HISTORY: Past Medical History:  Diagnosis Date   Bladder stones    Hx of   Depression    GERD (gastroesophageal reflux disease)    Intertrigo 04/24/2017   Left upper extremity swelling 04/24/2017   Multiple sclerosis ([facility])    Paraplegia ([facility])    PTSD (post-traumatic stress disorder)    Transverse myelitis ([place])    Yeast infection 09/26/2017    MEDICATIONS: Current Outpatient Medications on File Prior to Visit  Medication Sig Dispense Refill   acetaminophen (TYLENOL) 325 MG tablet Take 650 mg by mouth every 6 (six) hours as needed for fever.     baclofen (LIORESAL) 20 MG tablet Take 1 tablet (20 mg total) by mouth 4 (four) times daily. 112 tablet 5   bisacodyl (DULCOLAX) 10 MG suppository Place 1 suppository (10 mg total) rectally daily as needed for mild constipation.     diazepam (VALIUM) 5 MG tablet Take 1-2 tablets (5-10 mg total) by mouth 2 (two) times daily. Take 1 tablet (5 mg) BID and Take 2 tablets (10 mg) at bedtime 14 tablet 0   diphenhydrAMINE (BENADRYL) 25 mg capsule Take 1 capsule (25 mg total) by mouth every 8 (eight) hours as needed for itching. (Patient taking differently: Take 50 mg by mouth every 8 (eight) hours as needed for itching. ) 30 capsule 0   fluticasone (FLONASE) 50 MCG/ACT nasal spray Place 2 sprays into the nose daily as needed for allergies.      gabapentin (NEURONTIN) 400 MG capsule Take 2 capsules (800 mg total) by mouth 3 (three) times daily. 180 capsule 8   guaiFENesin-dextromethorphan (ROBITUSSIN DM) 100-10 MG/5ML syrup Take 10 mLs by mouth every 4 (four) hours as  needed for cough. 118 mL 0   ibuprofen (ADVIL,MOTRIN) 200 MG tablet Take 200-400 mg by mouth every 6 (six) hours as needed for headache or moderate pain.      lamoTRIgine (LAMICTAL) 25 MG tablet Take 2 tablets (50 mg total) by mouth daily. 60 tablet 0   loperamide (IMODIUM) 2 MG capsule Take 2 capsules (4 mg total) by mouth 3 (three) times daily as needed for  diarrhea or loose stools. 30 capsule 0   loratadine (CLARITIN) 5 MG/5ML syrup Take 5 mg daily as needed by mouth for allergies.      Multiple Vitamins-Minerals (CERTAGEN PO) Take 1 tablet by mouth daily.     omeprazole (PRILOSEC) 20 MG capsule Take 20 mg by mouth daily.     ondansetron (ZOFRAN-ODT) 4 MG disintegrating tablet Take 1 tablet (4 mg total) by mouth every 8 (eight) hours as needed for nausea or vomiting. 20 tablet 0   sertraline (ZOLOFT) 100 MG tablet Take 1 tablet (100 mg total) by mouth 2 (two) times daily. 60 tablet 0   No current facility-administered medications on file prior to visit.    ALLERGIES: Allergies  Allergen Reactions   Iodine Anaphylaxis   Latex Anaphylaxis   Polymyxin B Hives    Pt had hives in right arm with infusion of polymyxin on November 26th, 2018   Shellfish-Derived Products Anaphylaxis    Reaction unknown   Vancomycin Anaphylaxis   Aspirin Other (See Comments)    Reaction unknown   Strawberry Extract Hives and Rash    FAMILY HISTORY: Family History  Problem Relation Age of Onset   Diabetes type II Father    CAD Father    Dementia Father    Stroke Father    Seizures Father    Diabetes type II Mother    Hypertension Mother    Breast cancer Other    Diabetes type II Other     SOCIAL HISTORY: Social History   Socioeconomic History   Marital status: Single    Spouse name: Not on file   Number of children: 0   Years of education: Not on file   Highest education level: Not on file  Occupational History   Occupation: disabled  Tobacco Use   Smoking status: Never Smoker   Smokeless tobacco: Never Used  Scientific laboratory technician Use: Never used  Substance and Sexual Activity   Alcohol use: No   Drug use: No   Sexual activity: Not Currently    Birth control/protection: Abstinence  Other Topics Concern   Not on file  Social History Narrative   Lives with parents.  All disabled.  Attended [employer] until she  got transverse myelitis. Right handed   Social Determinants of Health   Financial Resource Strain: Not on file  Food Insecurity: Not on file  Transportation Needs: Not on file  Physical Activity: Not on file  Stress: Not on file  Social Connections: Not on file  Intimate Partner Violence: Not on file     Objective:  Pulse 75, resp. rate 20, SpO2 98 %. General: No acute distress.  Patient appears well-groomed.   Head:  Normocephalic/atraumatic Eyes:  Fundi examined but not visualized Neck: supple, no paraspinal tenderness, full range of motion Heart:  Regular rate and rhythm Lungs:  Clear to auscultation bilaterally Back: No paraspinal tenderness Neurological Exam: alert and oriented to person, place, and time. Attention span and concentration intact, recent and remote memory intact, fund of knowledge intact.  Speech fluent and  not dysarthric, language intact.  CN II-XII intact.  Decreased muscle bulk in left upper extremity and bilateral lower extremities, atrophy in both hands.  Increased tone in left upper and both lower extremities.  Muscle strength  4/5 left deltoid, left hand grip, otherwise 5/5  0/5 lower extremities.  Sensation to light touch reduced in lower extremities.  Deep tendon reflexes 3+ throughout, slightly more brisk on the left, toes downgoing.  Nonambulatory.   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  Gabapentin 800mg  three times daily and baclofen 20mg  four times daily 2.  Follow up in one year.  Metta Clines, DO

## 2020-05-17 ENCOUNTER — Ambulatory Visit (INDEPENDENT_AMBULATORY_CARE_PROVIDER_SITE_OTHER): Payer: Medicaid Other | Admitting: Neurology

## 2020-05-17 ENCOUNTER — Other Ambulatory Visit: Payer: Self-pay

## 2020-05-17 ENCOUNTER — Encounter: Payer: Self-pay | Admitting: Neurology

## 2020-05-17 VITALS — HR 75 | Resp 20

## 2020-05-17 DIAGNOSIS — G373 Acute transverse myelitis in demyelinating disease of central nervous system: Secondary | ICD-10-CM

## 2020-05-17 DIAGNOSIS — G8220 Paraplegia, unspecified: Secondary | ICD-10-CM | POA: Diagnosis not present

## 2020-05-17 NOTE — Patient Instructions (Signed)
Continue gabapentin and baclofen

## 2020-08-12 ENCOUNTER — Ambulatory Visit: Payer: Medicaid Other | Admitting: Nurse Practitioner

## 2020-08-30 ENCOUNTER — Ambulatory Visit: Payer: Medicaid Other | Admitting: Certified Nurse Midwife

## 2020-08-30 ENCOUNTER — Ambulatory Visit: Payer: Medicaid Other | Admitting: Family Medicine

## 2020-09-14 ENCOUNTER — Ambulatory Visit (INDEPENDENT_AMBULATORY_CARE_PROVIDER_SITE_OTHER): Payer: Medicaid Other | Admitting: Certified Nurse Midwife

## 2020-09-14 ENCOUNTER — Other Ambulatory Visit: Payer: Self-pay

## 2020-09-14 ENCOUNTER — Other Ambulatory Visit (HOSPITAL_COMMUNITY)
Admission: RE | Admit: 2020-09-14 | Discharge: 2020-09-14 | Disposition: A | Discharge status: Home / Self Care | Payer: Medicaid Other | Source: Ambulatory Visit | Attending: Obstetrics & Gynecology | Admitting: Obstetrics & Gynecology

## 2020-09-14 ENCOUNTER — Encounter: Payer: Self-pay | Admitting: Certified Nurse Midwife

## 2020-09-14 VITALS — BP 109/66 | HR 76 | Wt 191.3 lb

## 2020-09-14 DIAGNOSIS — N898 Other specified noninflammatory disorders of vagina: Secondary | ICD-10-CM | POA: Insufficient documentation

## 2020-09-14 DIAGNOSIS — Z124 Encounter for screening for malignant neoplasm of cervix: Secondary | ICD-10-CM | POA: Diagnosis not present

## 2020-09-14 NOTE — Progress Notes (Signed)
History:  Ms. Henley Blyth is a 40 y.o. G0P0000 who presents to clinic today for abnormal vaginal discharge. Ms. Totaro has paraplegia and lives in a skilled nursing facility receiving total care including ostomy and peri care. It has been reported to her that she has yellow discharge without abnormal odor, but feels the caregivers do not report things to her fully and would like to get checked out by a professional. She would also like her Pap updated and has irregular cycles but is not interested in addressing that today.  The following portions of the patient's history were reviewed and updated as appropriate: allergies, current medications, family history, past medical history, social history, past surgical history and problem list.  Pt transferred to exam chair by transport and nursing staff. Review of Systems:  Pertinent items noted in HPI and remainder of comprehensive ROS otherwise negative.   Objective:  Physical Exam BP 109/66   Pulse 76   Wt 191 lb 4.8 oz (86.8 kg) Comment: from facility 09/01/20  LMP 08/21/2020 (Approximate)   BMI 33.89 kg/m  Physical Exam Vitals and nursing note reviewed. Exam conducted with a chaperone present.  HENT:     Head: Normocephalic and atraumatic.  Eyes:     Pupils: Pupils are equal, round, and reactive to light.  Cardiovascular:     Rate and Rhythm: Normal rate.  Pulmonary:     Effort: Pulmonary effort is normal.  Abdominal:     Palpations: Abdomen is soft.  Genitourinary:    Exam position: Lithotomy position.     Vagina: Vaginal discharge (thick yellow to clear mucus) present.    Musculoskeletal:        General: Normal range of motion.  Skin:    General: Skin is warm.     Capillary Refill: Capillary refill takes less than 2 seconds.  Neurological:     Mental Status: She is alert and oriented to person, place, and time.  Psychiatric:        Mood and Affect: Mood normal.        Behavior: Behavior normal.        Thought  Content: Thought content normal.        Judgment: Judgment normal.   Assessment & Plan:  1. Vaginal discharge - Swabs collected (wet prep and GC/CT), will follow and manage accordingly - Facility requested written report with recommendations. Summarized findings and strongly encouraged proper peri care at LEAST daily with gently antibacterial soap only and use of barrier cream if needed (but not skin prep spray). Also instructed caregivers to perform peri care immediately after bowel movements, not allowing pt to sit in fecal matter for long periods of time. Reinforced need to avoid preventable use of antibiotics/antifungals for vaginal infections caused by lax peri care.  2. Papanicolaou smear for cervical cancer screening - Will follow results and manage accordingly  Follow up PRN or in one year for annual exam  Gabriel Carina, CNM 09/14/2020 9:55 AM

## 2020-09-15 LAB — CERVICOVAGINAL ANCILLARY ONLY
Bacterial Vaginitis (gardnerella): NEGATIVE
Candida Glabrata: NEGATIVE
Candida Vaginitis: NEGATIVE
Chlamydia: NEGATIVE
Comment: NEGATIVE
Comment: NEGATIVE
Comment: NEGATIVE
Comment: NEGATIVE
Comment: NEGATIVE
Comment: NORMAL
Neisseria Gonorrhea: NEGATIVE
Trichomonas: NEGATIVE

## 2020-09-19 LAB — CYTOLOGY - PAP
Comment: NEGATIVE
Diagnosis: UNDETERMINED — AB
High risk HPV: NEGATIVE

## 2021-01-10 ENCOUNTER — Telehealth: Payer: Self-pay | Admitting: Neurology

## 2021-01-10 DIAGNOSIS — G8220 Paraplegia, unspecified: Secondary | ICD-10-CM

## 2021-01-10 NOTE — Telephone Encounter (Signed)
Pt called in and left a message with the access nurse. Pt stated she needed a "four link bed roll for her bed".

## 2021-01-10 NOTE — Telephone Encounter (Signed)
Telephone to Clarify what she needed to have order by [facility]?  Per Pt she was advised to have her Neurologist order Full Length Rails for her bed to keep her falling off of the bed since she has been marked as a Fall Risk.  Telephone call to the [facility] center [phone].  Please send order to [phone] or [phone]  Center will place order in the pt chart to give to the attending and Rehabilitation Provider.    Full Length Rails ordered and faxed to [facility] as requested and approved by DR.Jaffe.

## 2021-01-16 ENCOUNTER — Telehealth: Payer: Self-pay | Admitting: Neurology

## 2021-01-16 NOTE — Telephone Encounter (Signed)
Pt called and LM regarding the rails she is requesting. She did not pick up , so I lm.

## 2021-01-16 NOTE — Telephone Encounter (Signed)
Per pt, she was advised that is Illegal to for them to have pt with full length Rails. It is concerned as a restraint .  Pt wanted to know if we could call the Rehabilitation center to see what she needs or can have for the risk of falls.  Advised pt I will try and call and see what I can do.   Telephone call to the [facility]. No unable to speak will call back later.

## 2021-01-25 NOTE — Telephone Encounter (Signed)
Advised pt to speak to her PCP or the Rehabilitation Attending.

## 2021-05-15 NOTE — Progress Notes (Signed)
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  Gabapentin 800mg  three times daily and baclofen 20mg  four times daily 2.  Follow up in one year.  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   UPDATE: Medications: gabapentin 800mg  three times daily; diazepam 5mg  twice daily, baclofen 20mg  three times daily, lamotrigine 50mg , sertraline100mg  twice daily    She has not gotten the baclofen pump.  Overall, pain is controlled but does have spasms.   HISTORY: In 2005, she developed acute transverse myelitis.  She woke up with severe neck and back pain with weakness in the arms and legs, as well as bowel and bladder retention.  MRI of cervical spine showed abnormal signal abnormality and cord swelling from C4 through C7 with abnormal enhancement at the C4-5 level.  MRI of thoracic spine showed upper thoracic cord involvement at TI and T2.  She underwent lumbar puncture and was treated with 3 days of IV Solu-Medrol, followed by oral steroids.     She is paraplegic, unable to move below the waist.  She has both bowel and urinary retention.  She has right greater than left upper extremity weakness.   She has a urostomy pouch and requires catheterizations.  She uses stool softeners for constipation.  She takes Diazepam and Baclofen for muscle spasms, as well as gabapentin.  She has chronic sacral decubitus ulcer for which she is followed by wound care.  She reports daytime somnolence.  She sees psychiatry for depression and PTSD.   A follow up MRI of the neuroaxis with and without contrast was performed on 03/24/15, which was personally reviewed.  The brain showed slight cerebral volume loss but no demyelinating lesions.  The cervical and thoracic spinal cord showed  atrophy extending from C6 to T2, greatest at C7-T1.  There is a left central disc extrusion at C5-6 resulting in cord flattening with new T2 signal at this level but no abnormal enhancement.  PAST MEDICAL HISTORY: Past Medical History:  Diagnosis Date   Bladder stones    Hx of   Depression    GERD (gastroesophageal reflux disease)    Intertrigo 04/24/2017   Left upper extremity swelling 04/24/2017   Multiple sclerosis (HCC)    Paraplegia (HCC)    PTSD (post-traumatic stress disorder)    Transverse myelitis ([place])    Yeast infection 09/26/2017    MEDICATIONS: Current Outpatient Medications on File Prior to Visit  Medication Sig Dispense Refill   acetaminophen (TYLENOL) 325 MG tablet Take 650 mg by mouth every 6 (six) hours as needed for fever.     Amino Acids (AMINO ACID PROTEIN PO) Take by mouth.     baclofen (LIORESAL) 20 MG tablet Take 1 tablet (20 mg total) by mouth 4 (four) times daily. 112 tablet 5   diazepam (VALIUM) 5 MG tablet Take 1-2 tablets (5-10 mg total) by mouth 2 (two) times daily. Take 1 tablet (5 mg) BID and Take 2 tablets (10 mg) at bedtime 14 tablet 0   diphenhydrAMINE (BENADRYL) 25 mg capsule Take 1 capsule (25 mg total) by mouth every 8 (eight) hours as needed for itching. (Patient taking differently: Take 25 mg by mouth every 6 (six) hours as needed for itching.) 30 capsule 0   fluticasone (FLONASE) 50 MCG/ACT nasal spray Place 2 sprays into  the nose daily as needed for allergies.      gabapentin (NEURONTIN) 400 MG capsule Take 400 mg by mouth 3 (three) times daily.     lamoTRIgine (LAMICTAL) 25 MG tablet Take 2 tablets (50 mg total) by mouth daily. (Patient taking differently: Take 75 mg by mouth 2 (two) times daily. 25mg  takes tid) 60 tablet 0   loratadine (CLARITIN) 10 MG tablet Take 10 mg by mouth daily.     metroNIDAZOLE (METROGEL) 1 % gel Apply topically at bedtime.     Multiple Vitamins-Minerals (CERTAGEN PO) Take 1 tablet by mouth daily.     Nystatin POWD by  Does not apply route as needed.     omeprazole (PRILOSEC) 20 MG capsule Take 20 mg by mouth daily.     ondansetron (ZOFRAN-ODT) 4 MG disintegrating tablet Take 1 tablet (4 mg total) by mouth every 8 (eight) hours as needed for nausea or vomiting. 20 tablet 0   sertraline (ZOLOFT) 100 MG tablet Take 1 tablet (100 mg total) by mouth 2 (two) times daily. 60 tablet 0   No current facility-administered medications on file prior to visit.    ALLERGIES: Allergies  Allergen Reactions   Iodine Anaphylaxis   Latex Anaphylaxis   Polymyxin B Hives    Pt had hives in right arm with infusion of polymyxin on November 26th, 2018   Shellfish-Derived Products Anaphylaxis    Reaction unknown   Vancomycin Anaphylaxis   Aspirin Other (See Comments)    Reaction unknown   Strawberry Extract Hives and Rash    FAMILY HISTORY: Family History  Problem Relation Age of Onset   Diabetes type II Father    CAD Father    Dementia Father    Stroke Father    Seizures Father    Diabetes type II Mother    Hypertension Mother    Breast cancer Other    Diabetes type II Other       Objective:  Blood pressure 100/60, pulse 86, height 5\' 5"  (1.651 m), weight 191 lb 8 oz (86.9 kg), SpO2 98 %. General: No acute distress.  Patient appears well-groomed.   Head:  Normocephalic/atraumatic Eyes:  Fundi examined but not visualized Neck: supple, no paraspinal tenderness, full range of motion Heart:  Regular rate and rhythm Lungs:  Clear to auscultation bilaterally Back: No paraspinal tenderness Neurological Exam: alert and oriented to person, place, and time. Speech fluent and not dysarthric, language intact.  CN II-XII intact.  Decreased muscle bulk in left upper extremity and bilateral lower extremities, atrophy in both hands.  Increased tone in left upper and both lower extremities.  Muscle strength  4/5 left deltoid, 3+/5 left hand grip, otherwise 5/5  0/5 lower extremities.  Sensation to light touch reduced in  lower extremities.  Deep tendon reflexes 3+ throughout, slightly more brisk on the left, toes downgoing.  Nonambulatory.   Metta Clines, DO  CC: Lennie Odor, PA-C

## 2021-05-17 ENCOUNTER — Encounter: Payer: Self-pay | Admitting: Neurology

## 2021-05-17 ENCOUNTER — Ambulatory Visit (INDEPENDENT_AMBULATORY_CARE_PROVIDER_SITE_OTHER): Payer: Medicaid Other | Admitting: Neurology

## 2021-05-17 ENCOUNTER — Other Ambulatory Visit: Payer: Self-pay

## 2021-05-17 VITALS — BP 100/60 | HR 86 | Ht 65.0 in | Wt 191.5 lb

## 2021-05-17 DIAGNOSIS — G8220 Paraplegia, unspecified: Secondary | ICD-10-CM | POA: Diagnosis not present

## 2021-05-17 DIAGNOSIS — G373 Acute transverse myelitis in demyelinating disease of central nervous system: Secondary | ICD-10-CM

## 2021-05-17 NOTE — Patient Instructions (Signed)
No change in management Gabapentin 800mg  three times daily Baclofen 20mg  three times daily

## 2021-07-07 ENCOUNTER — Encounter: Payer: Self-pay | Admitting: Neurology

## 2021-07-07 ENCOUNTER — Encounter: Payer: Self-pay | Admitting: Certified Nurse Midwife

## 2021-07-08 ENCOUNTER — Encounter: Payer: Self-pay | Admitting: Certified Nurse Midwife

## 2021-07-17 ENCOUNTER — Encounter: Payer: Self-pay | Admitting: *Deleted

## 2021-08-03 ENCOUNTER — Encounter: Payer: Self-pay | Admitting: Neurology

## 2021-08-13 ENCOUNTER — Encounter (HOSPITAL_COMMUNITY): Payer: Self-pay

## 2021-08-13 ENCOUNTER — Other Ambulatory Visit: Payer: Self-pay

## 2021-08-13 ENCOUNTER — Emergency Department (HOSPITAL_COMMUNITY): Payer: Medicaid Other

## 2021-08-13 ENCOUNTER — Inpatient Hospital Stay (HOSPITAL_COMMUNITY)
Admission: EM | Admit: 2021-08-13 | Discharge: 2021-08-16 | DRG: 872 | Disposition: A | Discharge status: Skilled Nursing Facility | Payer: Medicaid Other | Source: Skilled Nursing Facility | Attending: Family Medicine | Admitting: Family Medicine

## 2021-08-13 DIAGNOSIS — Z881 Allergy status to other antibiotic agents status: Secondary | ICD-10-CM

## 2021-08-13 DIAGNOSIS — Z91040 Latex allergy status: Secondary | ICD-10-CM

## 2021-08-13 DIAGNOSIS — Z91013 Allergy to seafood: Secondary | ICD-10-CM

## 2021-08-13 DIAGNOSIS — M62838 Other muscle spasm: Secondary | ICD-10-CM | POA: Diagnosis present

## 2021-08-13 DIAGNOSIS — K219 Gastro-esophageal reflux disease without esophagitis: Secondary | ICD-10-CM | POA: Diagnosis present

## 2021-08-13 DIAGNOSIS — A419 Sepsis, unspecified organism: Principal | ICD-10-CM | POA: Diagnosis present

## 2021-08-13 DIAGNOSIS — Z936 Other artificial openings of urinary tract status: Secondary | ICD-10-CM

## 2021-08-13 DIAGNOSIS — Z9049 Acquired absence of other specified parts of digestive tract: Secondary | ICD-10-CM

## 2021-08-13 DIAGNOSIS — Z91041 Radiographic dye allergy status: Secondary | ICD-10-CM

## 2021-08-13 DIAGNOSIS — Z87440 Personal history of urinary (tract) infections: Secondary | ICD-10-CM

## 2021-08-13 DIAGNOSIS — L89621 Pressure ulcer of left heel, stage 1: Secondary | ICD-10-CM | POA: Diagnosis present

## 2021-08-13 DIAGNOSIS — F4310 Post-traumatic stress disorder, unspecified: Secondary | ICD-10-CM | POA: Diagnosis present

## 2021-08-13 DIAGNOSIS — F32A Depression, unspecified: Secondary | ICD-10-CM

## 2021-08-13 DIAGNOSIS — G8220 Paraplegia, unspecified: Secondary | ICD-10-CM | POA: Diagnosis present

## 2021-08-13 DIAGNOSIS — Z86718 Personal history of other venous thrombosis and embolism: Secondary | ICD-10-CM

## 2021-08-13 DIAGNOSIS — L89896 Pressure-induced deep tissue damage of other site: Secondary | ICD-10-CM | POA: Diagnosis present

## 2021-08-13 DIAGNOSIS — N390 Urinary tract infection, site not specified: Secondary | ICD-10-CM | POA: Diagnosis present

## 2021-08-13 DIAGNOSIS — Z886 Allergy status to analgesic agent status: Secondary | ICD-10-CM

## 2021-08-13 DIAGNOSIS — R6520 Severe sepsis without septic shock: Secondary | ICD-10-CM | POA: Diagnosis present

## 2021-08-13 DIAGNOSIS — L8990 Pressure ulcer of unspecified site, unspecified stage: Secondary | ICD-10-CM | POA: Insufficient documentation

## 2021-08-13 DIAGNOSIS — E669 Obesity, unspecified: Secondary | ICD-10-CM

## 2021-08-13 DIAGNOSIS — Z79899 Other long term (current) drug therapy: Secondary | ICD-10-CM

## 2021-08-13 DIAGNOSIS — L89312 Pressure ulcer of right buttock, stage 2: Secondary | ICD-10-CM | POA: Diagnosis present

## 2021-08-13 DIAGNOSIS — Z91018 Allergy to other foods: Secondary | ICD-10-CM

## 2021-08-13 DIAGNOSIS — G35 Multiple sclerosis: Secondary | ICD-10-CM | POA: Diagnosis present

## 2021-08-13 DIAGNOSIS — G373 Acute transverse myelitis in demyelinating disease of central nervous system: Secondary | ICD-10-CM | POA: Diagnosis present

## 2021-08-13 DIAGNOSIS — Z87442 Personal history of urinary calculi: Secondary | ICD-10-CM

## 2021-08-13 DIAGNOSIS — N200 Calculus of kidney: Secondary | ICD-10-CM | POA: Diagnosis present

## 2021-08-13 LAB — LACTIC ACID, PLASMA: Lactic Acid, Venous: 2.5 mmol/L (ref 0.5–1.9)

## 2021-08-13 LAB — PROTIME-INR
INR: 1.1 (ref 0.8–1.2)
Prothrombin Time: 14.1 seconds (ref 11.4–15.2)

## 2021-08-13 LAB — CBC WITH DIFFERENTIAL/PLATELET
Abs Immature Granulocytes: 0.05 10*3/uL (ref 0.00–0.07)
Basophils Absolute: 0 10*3/uL (ref 0.0–0.1)
Basophils Relative: 0 %
Eosinophils Absolute: 0.1 10*3/uL (ref 0.0–0.5)
Eosinophils Relative: 1 %
HCT: 37 % (ref 36.0–46.0)
Hemoglobin: 10.7 g/dL — ABNORMAL LOW (ref 12.0–15.0)
Immature Granulocytes: 0 %
Lymphocytes Relative: 5 %
Lymphs Abs: 0.6 10*3/uL — ABNORMAL LOW (ref 0.7–4.0)
MCH: 21.1 pg — ABNORMAL LOW (ref 26.0–34.0)
MCHC: 28.9 g/dL — ABNORMAL LOW (ref 30.0–36.0)
MCV: 73 fL — ABNORMAL LOW (ref 80.0–100.0)
Monocytes Absolute: 0.4 10*3/uL (ref 0.1–1.0)
Monocytes Relative: 3 %
Neutro Abs: 10.9 10*3/uL — ABNORMAL HIGH (ref 1.7–7.7)
Neutrophils Relative %: 91 %
Platelets: 221 10*3/uL (ref 150–400)
RBC: 5.07 MIL/uL (ref 3.87–5.11)
RDW: 19.9 % — ABNORMAL HIGH (ref 11.5–15.5)
WBC: 12 10*3/uL — ABNORMAL HIGH (ref 4.0–10.5)
nRBC: 0 % (ref 0.0–0.2)

## 2021-08-13 LAB — COMPREHENSIVE METABOLIC PANEL
ALT: 29 U/L (ref 0–44)
AST: 26 U/L (ref 15–41)
Albumin: 4 g/dL (ref 3.5–5.0)
Alkaline Phosphatase: 83 U/L (ref 38–126)
Anion gap: 9 (ref 5–15)
BUN: 12 mg/dL (ref 6–20)
CO2: 25 mmol/L (ref 22–32)
Calcium: 8.9 mg/dL (ref 8.9–10.3)
Chloride: 101 mmol/L (ref 98–111)
Creatinine, Ser: 0.62 mg/dL (ref 0.44–1.00)
GFR, Estimated: >60 mL/min (ref >60)
Glucose, Bld: 148 mg/dL — ABNORMAL HIGH (ref 70–99)
Potassium: 3.5 mmol/L (ref 3.5–5.1)
Sodium: 135 mmol/L (ref 135–145)
Total Bilirubin: 0.9 mg/dL (ref 0.3–1.2)
Total Protein: 8 g/dL (ref 6.5–8.1)

## 2021-08-13 MED ORDER — LACTATED RINGERS IV SOLN: INTRAVENOUS | Status: DC

## 2021-08-13 MED ORDER — SODIUM CHLORIDE 0.9 % IV SOLN
2.0000 g | Freq: Once | INTRAVENOUS | Status: AC
  Administered 2021-08-13: 2 g via INTRAVENOUS
  Filled 2021-08-13: qty 12.5

## 2021-08-13 MED ORDER — LACTATED RINGERS IV BOLUS
1000.0000 mL | Freq: Once | INTRAVENOUS | Status: AC
  Administered 2021-08-14: 1000 mL via INTRAVENOUS

## 2021-08-13 NOTE — Progress Notes (Signed)
A consult was received from an ED physician for Cefepime per pharmacy dosing.  The patient's profile has been reviewed for ht/wt/allergies/indication/available labs.   ? ?A one time order has been placed for Cefepime 2gm IV.   ? ?Further antibiotics/pharmacy consults should be ordered by admitting physician if indicated.       ?                ?Thank you, ?Everette Rank, PharmD ?08/13/2021  11:04 PM ? ?

## 2021-08-13 NOTE — Sepsis Progress Note (Signed)
[facility] following for Sepsis Protocol 

## 2021-08-13 NOTE — ED Provider Notes (Signed)
?[facility] DEPT ?Provider Note ? ? ?CSN: [id number] ?Arrival date & time: 08/13/21  2218 ? ?  ? ?History ? ?Chief Complaint  ?Patient presents with  ? Fever  ? ? ?[name] is a 41 y.o. female with a history of transverse myelitis, paraplegia, multiple scelrosis, urostomy, osteomyelitis of the pelvis, and prior DVT who presents to the ED via EMS from [place] care facility for evaluation of fever tonight.  Patient states she has not been feeling well for the past few days with fairly persistent nausea and dry heaves.  Has had some upper abdominal pain bilaterally as well as in her back.  Tonight nursing care facility checked her temperature she felt warm to the touch and has she was febrile to 104.7, they applied cool rags and ultimately give 650 mg of Tylenol at 2130, persistently febrile therefore called EMS.  Patient had recent abnormal urine 07/20/2021, received Bactrim twice daily for 10 days for this.  She denies chest pain, cough, vomiting, diarrhea, or hematuria. ? ?HPI ? ?  ? ?Home Medications ?Prior to Admission medications   ?Medication Sig Start Date End Date Taking? Authorizing Provider  ?acetaminophen (TYLENOL) 325 MG tablet Take 650 mg by mouth every 6 (six) hours as needed for fever. ?Patient not taking: Reported on 05/17/2021    [provider]  ?Amino Acids (AMINO ACID PROTEIN PO) Take by mouth.    [provider]  ?baclofen (LIORESAL) 20 MG tablet Take 1 tablet (20 mg total) by mouth 4 (four) times daily. 02/13/19   Pieter Partridge, DO  ?diazepam (VALIUM) 5 MG tablet Take 1-2 tablets (5-10 mg total) by mouth 2 (two) times daily. Take 1 tablet (5 mg) BID and Take 2 tablets (10 mg) at bedtime 03/12/18   Aline August, MD  ?diphenhydrAMINE (BENADRYL) 25 mg capsule Take 1 capsule (25 mg total) by mouth every 8 (eight) hours as needed for itching. ?Patient taking differently: Take 25 mg by mouth every 6 (six) hours as needed for  itching. 03/12/17   Rosita Fire, MD  ?fluticasone Asencion Islam) 50 MCG/ACT nasal spray Place 2 sprays into the nose daily as needed for allergies.     [provider]  ?gabapentin (NEURONTIN) 400 MG capsule Take 400 mg by mouth 3 (three) times daily.    [provider]  ?lamoTRIgine (LAMICTAL) 25 MG tablet Take 2 tablets (50 mg total) by mouth daily. ?Patient taking differently: Take 75 mg by mouth 2 (two) times daily. '25mg'$  takes tid 04/03/17   Bonnielee Haff, MD  ?loratadine (CLARITIN) 10 MG tablet Take 10 mg by mouth daily.    [provider]  ?metroNIDAZOLE (METROGEL) 1 % gel Apply topically at bedtime. ?Patient not taking: Reported on 05/17/2021    [provider]  ?Multiple Vitamins-Minerals (CERTAGEN PO) Take 1 tablet by mouth daily.    [provider]  ?Nystatin POWD by Does not apply route as needed.    [provider]  ?omeprazole (PRILOSEC) 20 MG capsule Take 20 mg by mouth daily.    [provider]  ?ondansetron (ZOFRAN-ODT) 4 MG disintegrating tablet Take 1 tablet (4 mg total) by mouth every 8 (eight) hours as needed for nausea or vomiting. 03/12/17   Rosita Fire, MD  ?sertraline (ZOLOFT) 100 MG tablet Take 1 tablet (100 mg total) by mouth 2 (two) times daily. 04/03/17   Bonnielee Haff, MD  ?   ? ?Allergies    ?Iodine, Latex, Polymyxin b,  Shellfish-derived products, Vancomycin, Aspirin, and Strawberry extract   ? ?Review of Systems   ?Review of Systems  ?Constitutional:  Positive for chills and fever.  ?     Positive for generalized malaise.  ?Respiratory:  Negative for cough.   ?Cardiovascular:  Negative for chest pain.  ?Gastrointestinal:  Positive for abdominal pain and nausea. Negative for diarrhea and vomiting.  ?Skin:  Negative for rash.  ?All other systems reviewed and are negative. ? ?Physical Exam ?Updated Vital Signs ?BP 111/85 (BP Location: Left Arm)   Pulse (!) 110   Temp (!) 100.6 ?F (38.1 ?C) (Oral)   Resp (!) 23    SpO2 96%  ?Physical Exam ?Vitals and nursing note reviewed.  ?Constitutional:   ?   General: She is not in acute distress. ?   Appearance: She is well-developed.  ?HENT:  ?   Head: Normocephalic and atraumatic.  ?Eyes:  ?   General:     ?   Right eye: No discharge.     ?   Left eye: No discharge.  ?   Conjunctiva/sclera: Conjunctivae normal.  ?Cardiovascular:  ?   Rate and Rhythm: Regular rhythm. Tachycardia present.  ?Pulmonary:  ?   Effort: No respiratory distress.  ?   Breath sounds: Normal breath sounds. No wheezing or rales.  ?Abdominal:  ?   General: There is no distension.  ?   Palpations: Abdomen is soft.  ?   Tenderness: There is abdominal tenderness in the right upper quadrant, epigastric area and left upper quadrant.  ?   Comments: Right lower quadrant urostomy present with thick somewhat purulent appearing urine  ?Musculoskeletal:  ?   Cervical back: Neck supple.  ?Skin: ?   General: Skin is warm and dry.  ?   Comments: Flushed appearance.  ?Neurological:  ?   Mental Status: She is alert.  ?   Comments: Clear speech.   ?Psychiatric:     ?   Behavior: Behavior normal.  ? ? ?ED Results / Procedures / Treatments   ?Labs ?(all labs ordered are listed, but only abnormal results are displayed) ?Labs Reviewed  ?CULTURE, BLOOD (ROUTINE X 2)  ?CULTURE, BLOOD (ROUTINE X 2)  ?COMPREHENSIVE METABOLIC PANEL  ?LACTIC ACID, PLASMA  ?LACTIC ACID, PLASMA  ?CBC WITH DIFFERENTIAL/PLATELET  ?PROTIME-INR  ?URINALYSIS, ROUTINE W REFLEX MICROSCOPIC  ?I-STAT BETA HCG BLOOD, ED ([facility], [facility], [facility] ONLY)  ? ? ?EKG ?None ? ?Radiology ?CT ABDOMEN PELVIS WO CONTRAST ? ?Result Date: 08/14/2021 ?CLINICAL DATA:  Acute abdominal pain and fevers EXAM: CT ABDOMEN AND PELVIS WITHOUT CONTRAST TECHNIQUE: Multidetector CT imaging of the abdomen and pelvis was performed following the standard protocol without IV contrast. RADIATION DOSE REDUCTION: This exam was performed according to the departmental dose-optimization program which includes  automated exposure control, adjustment of the mA and/or kV according to patient size and/or use of iterative reconstruction technique. COMPARISON:  03/10/2018 FINDINGS: Lower chest: Mild scarring is noted in the bases bilaterally. Hepatobiliary: No focal liver abnormality is seen. Status post cholecystectomy. No biliary dilatation. Pancreas: Unremarkable. No pancreatic ductal dilatation or surrounding inflammatory changes. Spleen: Normal in size without focal abnormality. Adrenals/Urinary Tract: Adrenal glands are within normal limits. Kidneys are well visualized bilaterally. Renal calculi are noted bilaterally worse on the right than the left. Largest on the right measures 11 mm. Ileal loop is noted on the right with ostomy. No obstructive changes are seen. Decompressed bladder is noted in the pelvis. Stomach/Bowel: The appendix is within normal limits. No obstructive  or inflammatory changes of the colon are seen. Stomach and small bowel are within normal limits. Vascular/Lymphatic: No significant vascular findings are present. No enlarged abdominal or pelvic lymph nodes. Reproductive: Uterus and bilateral adnexa are unremarkable. Other: No free fluid is noted. Musculoskeletal: No acute or significant osseous findings. IMPRESSION: Bilateral renal calculi right greater than left without obstructive change. Changes of prior ileal loop conduit Electronically Signed   By: Inez Catalina M.D.   On: 08/14/2021 01:48  ? ?DG Chest Port 1 View ? ?Result Date: 08/13/2021 ?CLINICAL DATA:  Fever, recent urinary tract infection EXAM: PORTABLE CHEST 1 VIEW COMPARISON:  03/07/2018 FINDINGS: The heart size and mediastinal contours are within normal limits. Both lungs are clear. The visualized skeletal structures are unremarkable. IMPRESSION: No active disease. Electronically Signed   By: Randa Ngo M.D.   On: 08/13/2021 23:22   ? ?Procedures ?Marland KitchenCritical Care ?Performed by: Amaryllis Dyke, PA-C ?Authorized by: Amaryllis Dyke, PA-C  ? CRITICAL CARE ?Performed by: Kennith Maes ? ? ?Total critical care time: 30 minutes ? ?Critical care time was exclusive of separately billable procedures and treating other patients. ? ?C

## 2021-08-13 NOTE — ED Triage Notes (Signed)
Patient BIB [facility] from [facility]. Called out for fever of 104.36F. Facility gave '650mg'$  and fever reduced to 103.5. Tested positive for 2 different bacteria in her urine 2 weeks ago. Got '4mg'$  zofran at 9:30pm. Has not been feeling well.  ?

## 2021-08-13 NOTE — ED Provider Notes (Signed)
I provided a substantive portion of the care of this patient.  I personally performed the entirety of the medical decision making for this encounter. ? ? ?41 year old female presents with fever at the nursing home up to 104.5.  Has history of UTI with multiple organisms.  Has been treated with antibiotics they are without relief.  Patient likely with sepsis presentation here.  Will be started on IV fluid bolus as well as given IV antibiotics admitted to the hospital ?   ?  ?Lacretia Leigh, MD ?08/13/21 2246 ? ?

## 2021-08-14 ENCOUNTER — Encounter (HOSPITAL_COMMUNITY): Payer: Self-pay | Admitting: Internal Medicine

## 2021-08-14 ENCOUNTER — Emergency Department (HOSPITAL_COMMUNITY): Payer: Medicaid Other

## 2021-08-14 DIAGNOSIS — K219 Gastro-esophageal reflux disease without esophagitis: Secondary | ICD-10-CM | POA: Diagnosis present

## 2021-08-14 DIAGNOSIS — G35 Multiple sclerosis: Secondary | ICD-10-CM | POA: Diagnosis present

## 2021-08-14 DIAGNOSIS — L89621 Pressure ulcer of left heel, stage 1: Secondary | ICD-10-CM | POA: Diagnosis present

## 2021-08-14 DIAGNOSIS — R6520 Severe sepsis without septic shock: Secondary | ICD-10-CM

## 2021-08-14 DIAGNOSIS — Z87442 Personal history of urinary calculi: Secondary | ICD-10-CM | POA: Diagnosis not present

## 2021-08-14 DIAGNOSIS — Z86718 Personal history of other venous thrombosis and embolism: Secondary | ICD-10-CM | POA: Diagnosis not present

## 2021-08-14 DIAGNOSIS — Z936 Other artificial openings of urinary tract status: Secondary | ICD-10-CM | POA: Diagnosis not present

## 2021-08-14 DIAGNOSIS — F4310 Post-traumatic stress disorder, unspecified: Secondary | ICD-10-CM | POA: Diagnosis present

## 2021-08-14 DIAGNOSIS — N390 Urinary tract infection, site not specified: Secondary | ICD-10-CM | POA: Diagnosis present

## 2021-08-14 DIAGNOSIS — G8220 Paraplegia, unspecified: Secondary | ICD-10-CM

## 2021-08-14 DIAGNOSIS — Z91041 Radiographic dye allergy status: Secondary | ICD-10-CM | POA: Diagnosis not present

## 2021-08-14 DIAGNOSIS — Z886 Allergy status to analgesic agent status: Secondary | ICD-10-CM | POA: Diagnosis not present

## 2021-08-14 DIAGNOSIS — Z881 Allergy status to other antibiotic agents status: Secondary | ICD-10-CM | POA: Diagnosis not present

## 2021-08-14 DIAGNOSIS — N200 Calculus of kidney: Secondary | ICD-10-CM | POA: Diagnosis present

## 2021-08-14 DIAGNOSIS — Z9049 Acquired absence of other specified parts of digestive tract: Secondary | ICD-10-CM | POA: Diagnosis not present

## 2021-08-14 DIAGNOSIS — F32A Depression, unspecified: Secondary | ICD-10-CM

## 2021-08-14 DIAGNOSIS — Z91018 Allergy to other foods: Secondary | ICD-10-CM | POA: Diagnosis not present

## 2021-08-14 DIAGNOSIS — A419 Sepsis, unspecified organism: Secondary | ICD-10-CM

## 2021-08-14 DIAGNOSIS — L89896 Pressure-induced deep tissue damage of other site: Secondary | ICD-10-CM | POA: Diagnosis present

## 2021-08-14 DIAGNOSIS — Z87440 Personal history of urinary (tract) infections: Secondary | ICD-10-CM | POA: Diagnosis not present

## 2021-08-14 DIAGNOSIS — R509 Fever, unspecified: Secondary | ICD-10-CM | POA: Diagnosis not present

## 2021-08-14 DIAGNOSIS — M62838 Other muscle spasm: Secondary | ICD-10-CM | POA: Diagnosis present

## 2021-08-14 DIAGNOSIS — L89312 Pressure ulcer of right buttock, stage 2: Secondary | ICD-10-CM | POA: Diagnosis present

## 2021-08-14 DIAGNOSIS — Z91040 Latex allergy status: Secondary | ICD-10-CM | POA: Diagnosis not present

## 2021-08-14 DIAGNOSIS — Z91013 Allergy to seafood: Secondary | ICD-10-CM | POA: Diagnosis not present

## 2021-08-14 LAB — URINALYSIS, ROUTINE W REFLEX MICROSCOPIC
Bilirubin Urine: NEGATIVE
Glucose, UA: NEGATIVE mg/dL
Ketones, ur: NEGATIVE mg/dL
Nitrite: NEGATIVE
Protein, ur: 30 mg/dL — AB
Specific Gravity, Urine: 1.014 (ref 1.005–1.030)
pH: 7 (ref 5.0–8.0)

## 2021-08-14 LAB — CBC
HCT: 29.5 % — ABNORMAL LOW (ref 36.0–46.0)
Hemoglobin: 8.6 g/dL — ABNORMAL LOW (ref 12.0–15.0)
MCH: 20.9 pg — ABNORMAL LOW (ref 26.0–34.0)
MCHC: 29.2 g/dL — ABNORMAL LOW (ref 30.0–36.0)
MCV: 71.6 fL — ABNORMAL LOW (ref 80.0–100.0)
Platelets: 199 10*3/uL (ref 150–400)
RBC: 4.12 MIL/uL (ref 3.87–5.11)
RDW: 19.3 % — ABNORMAL HIGH (ref 11.5–15.5)
WBC: 8.6 10*3/uL (ref 4.0–10.5)
nRBC: 0 % (ref 0.0–0.2)

## 2021-08-14 LAB — HCG, QUANTITATIVE, PREGNANCY: hCG, Beta Chain, Quant, S: <1 m[IU]/mL (ref <5)

## 2021-08-14 LAB — CREATININE, SERUM
Creatinine, Ser: 0.47 mg/dL (ref 0.44–1.00)
GFR, Estimated: >60 mL/min (ref >60)

## 2021-08-14 LAB — CBG MONITORING, ED: Glucose-Capillary: 120 mg/dL — ABNORMAL HIGH (ref 70–99)

## 2021-08-14 LAB — HIV ANTIBODY (ROUTINE TESTING W REFLEX): HIV Screen 4th Generation wRfx: NONREACTIVE

## 2021-08-14 LAB — LACTIC ACID, PLASMA: Lactic Acid, Venous: 1.8 mmol/L (ref 0.5–1.9)

## 2021-08-14 LAB — MRSA NEXT GEN BY PCR, NASAL: MRSA by PCR Next Gen: DETECTED — AB

## 2021-08-14 MED ORDER — LAMOTRIGINE 25 MG PO TABS
75.0000 mg | ORAL_TABLET | Freq: Two times a day (BID) | ORAL | Status: DC
  Administered 2021-08-14 – 2021-08-16 (×6): 75 mg via ORAL
  Filled 2021-08-14 (×7): qty 3

## 2021-08-14 MED ORDER — PANTOPRAZOLE SODIUM 40 MG PO TBEC
40.0000 mg | DELAYED_RELEASE_TABLET | Freq: Every day | ORAL | Status: DC
Start: 2021-08-14 — End: 2021-08-17
  Administered 2021-08-14 – 2021-08-16 (×3): 40 mg via ORAL
  Filled 2021-08-14 (×3): qty 1

## 2021-08-14 MED ORDER — ACETAMINOPHEN 325 MG PO TABS
650.0000 mg | ORAL_TABLET | Freq: Four times a day (QID) | ORAL | Status: DC | PRN
  Administered 2021-08-14 – 2021-08-16 (×3): 650 mg via ORAL
  Filled 2021-08-14 (×3): qty 2

## 2021-08-14 MED ORDER — SODIUM CHLORIDE 0.9 % IV BOLUS
500.0000 mL | INTRAVENOUS | Status: AC
  Administered 2021-08-14: 500 mL via INTRAVENOUS

## 2021-08-14 MED ORDER — GABAPENTIN 400 MG PO CAPS
800.0000 mg | ORAL_CAPSULE | Freq: Three times a day (TID) | ORAL | Status: DC
  Administered 2021-08-14: 800 mg via ORAL
  Filled 2021-08-14: qty 2

## 2021-08-14 MED ORDER — ENOXAPARIN SODIUM 40 MG/0.4ML IJ SOSY
40.0000 mg | PREFILLED_SYRINGE | INTRAMUSCULAR | Status: DC
  Administered 2021-08-14 – 2021-08-16 (×3): 40 mg via SUBCUTANEOUS
  Filled 2021-08-14 (×3): qty 0.4

## 2021-08-14 MED ORDER — LACTATED RINGERS IV SOLN: INTRAVENOUS | Status: DC

## 2021-08-14 MED ORDER — BACLOFEN 20 MG PO TABS
20.0000 mg | ORAL_TABLET | Freq: Four times a day (QID) | ORAL | Status: DC
  Administered 2021-08-14 (×2): 20 mg via ORAL
  Filled 2021-08-14 (×2): qty 2

## 2021-08-14 MED ORDER — LACTATED RINGERS IV BOLUS
1000.0000 mL | Freq: Once | INTRAVENOUS | Status: AC
  Administered 2021-08-14: 1000 mL via INTRAVENOUS

## 2021-08-14 MED ORDER — SERTRALINE HCL 100 MG PO TABS
100.0000 mg | ORAL_TABLET | Freq: Two times a day (BID) | ORAL | Status: DC
  Administered 2021-08-14 – 2021-08-16 (×6): 100 mg via ORAL
  Filled 2021-08-14 (×2): qty 1
  Filled 2021-08-14: qty 2
  Filled 2021-08-14 (×3): qty 1

## 2021-08-14 MED ORDER — IBUPROFEN 200 MG PO TABS
400.0000 mg | ORAL_TABLET | Freq: Once | ORAL | Status: AC
Start: 2021-08-14 — End: 2021-08-14
  Administered 2021-08-14: 400 mg via ORAL
  Filled 2021-08-14: qty 2

## 2021-08-14 MED ORDER — ONDANSETRON HCL 4 MG PO TABS
4.0000 mg | ORAL_TABLET | Freq: Three times a day (TID) | ORAL | Status: DC | PRN
  Administered 2021-08-14 – 2021-08-15 (×3): 4 mg via ORAL
  Filled 2021-08-14 (×3): qty 1

## 2021-08-14 MED ORDER — LORATADINE 10 MG PO TABS
10.0000 mg | ORAL_TABLET | Freq: Every day | ORAL | Status: DC
  Administered 2021-08-14 – 2021-08-16 (×3): 10 mg via ORAL
  Filled 2021-08-14 (×3): qty 1

## 2021-08-14 MED ORDER — DIAZEPAM 5 MG PO TABS: 5.0000 mg | ORAL_TABLET | Freq: Every evening | ORAL | Status: DC | PRN

## 2021-08-14 MED ORDER — CEFEPIME HCL 2 G IV SOLR
2.0000 g | Freq: Three times a day (TID) | INTRAVENOUS | Status: DC
  Administered 2021-08-14 – 2021-08-16 (×8): 2 g via INTRAVENOUS
  Filled 2021-08-14 (×8): qty 12.5

## 2021-08-14 MED ORDER — ACETAMINOPHEN 650 MG RE SUPP: 650.0000 mg | Freq: Four times a day (QID) | RECTAL | Status: DC | PRN

## 2021-08-14 NOTE — ED Notes (Signed)
Notified C. Hall about patient hypotension. BP systolic remaining in upper 90s. ?

## 2021-08-14 NOTE — H&P (Signed)
?History and Physical  ? ? ?[name] DOB: 1980/10/28 DOA: 08/13/2021 ? ?PCP: Lennie Odor, PA  ?Patient coming from: Skilled nursing facility. ? ?Chief Complaint: Fever. ? ?HPI: [name] is a 41 y.o. female with history of transverse myelitis with paraplegia and urostomy has been recently treated for urinary tract infection had a course of antibiotics which patient took for 10 days.  Last dose was at about 5 days ago.  Patient started having fever chills with nausea again last night and was transferred to ER.  As per the record patient had a fever 103 ?F. ? ?ED Course: In the ER patient was tachycardic with leukocytosis and elevated lactic acid.  UA is concerning for UTI.  CT chest abdomen pelvis does show nephrolithiasis with no obstruction.  Cultures obtained started on empiric antibiotics admitted for further observation. ? ?Review of Systems: As per HPI, rest all negative. ? ? ?Past Medical History:  ?Diagnosis Date  ? Bladder stones   ? Hx of  ? Depression   ? GERD (gastroesophageal reflux disease)   ? Intertrigo 04/24/2017  ? Left upper extremity swelling 04/24/2017  ? Multiple sclerosis ([place])   ? Paraplegia ([place])   ? PTSD (post-traumatic stress disorder)   ? Transverse myelitis ([place])   ? Yeast infection 09/26/2017  ? ? ?Past Surgical History:  ?Procedure Laterality Date  ? CHOLECYSTECTOMY    ? CYSTECTOMY W/ URETEROILEAL CONDUIT    ? IR FLUORO GUIDE CV LINE LEFT  02/25/2017  ? IR US GUIDE VASC ACCESS LEFT  02/25/2017  ? IRRIGATION AND DEBRIDEMENT ABSCESS N/A 02/18/2017  ? Procedure: IRRIGATION AND DEBRIDEMENT ABSCESS;  Surgeon: Excell Seltzer, MD;  Location: [facility];  Service: General;  Laterality: N/A;  ? TONSILLECTOMY    ? WISDOM TOOTH EXTRACTION    ? ? ? reports that she has never smoked. She has never used smokeless tobacco. She reports that she does not drink alcohol and does not use drugs. ? ?Allergies  ?Allergen Reactions  ? Iodine Anaphylaxis  ? Latex  Anaphylaxis  ? Other Other (See Comments)  ?  Documented on Order Summary Report - unknown reaction ?Apricot, collard greens, green bean, melon, peaches, turnip greens  ? Polymyxin B Hives  ?  Pt had hives in right arm with infusion of polymyxin on November 26th, 2018  ? Shellfish-Derived Products Anaphylaxis  ?  All seafood  ? Vancomycin Anaphylaxis  ? Aspirin Other (See Comments)  ?  Reaction unknown  ? Strawberry Extract Hives and Rash  ? ? ?Family History  ?Problem Relation Age of Onset  ? Diabetes type II Father   ? CAD Father   ? Dementia Father   ? Stroke Father   ? Seizures Father   ? Diabetes type II Mother   ? Hypertension Mother   ? Breast cancer Other   ? Diabetes type II Other   ? ? ?Prior to Admission medications   ?Medication Sig Start Date End Date Taking? Authorizing Provider  ?acetaminophen (TYLENOL) 325 MG tablet Take 650 mg by mouth every 4 (four) hours as needed for headache, mild pain or fever.   Yes [provider]  ?baclofen (LIORESAL) 20 MG tablet Take 1 tablet (20 mg total) by mouth 4 (four) times daily. 02/13/19  Yes Jaffe, Adam R, DO  ?diazepam (VALIUM) 5 MG tablet Take 1-2 tablets (5-10 mg total) by mouth 2 (two) times daily. Take 1 tablet (5 mg) BID and Take 2 tablets (10 mg) at bedtime ?  Patient taking differently: Take 10 mg by mouth at bedtime. for anxiety/anxiousness 03/12/18  Yes Aline August, MD  ?fluticasone (FLONASE) 50 MCG/ACT nasal spray Place 1 spray into the nose daily.   Yes [provider]  ?gabapentin (NEURONTIN) 400 MG capsule Take 800 mg by mouth 3 (three) times daily.   Yes [provider]  ?lamoTRIgine (LAMICTAL) 25 MG tablet Take 2 tablets (50 mg total) by mouth daily. ?Patient taking differently: Take 75 mg by mouth 2 (two) times daily. 04/03/17  Yes Bonnielee Haff, MD  ?loratadine (CLARITIN) 10 MG tablet Take 10 mg by mouth daily.   Yes [provider]  ?menthol-cetylpyridinium (CEPACOL) 3 MG lozenge Take 1 lozenge by mouth every  4 (four) hours as needed for sore throat.   Yes [provider]  ?Multiple Vitamins-Minerals (MULTIVITAMIN WITH MINERALS) tablet Take 1 tablet by mouth daily.   Yes [provider]  ?nystatin cream (MYCOSTATIN) Apply 1 application. topically daily as needed (for genitourinary dieseas/disorder for antifungal at bedtime). Apply to vaginal area   Yes [provider]  ?OLANZapine (ZYPREXA) 2.5 MG tablet Take 2.5 mg by mouth daily.   Yes [provider]  ?omeprazole (PRILOSEC) 20 MG capsule Take 20 mg by mouth daily.   Yes [provider]  ?ondansetron (ZOFRAN) 4 MG tablet Take 4 mg by mouth every 8 (eight) hours as needed for nausea or vomiting.   Yes [provider]  ?Probiotic Product (PROBIOTIC PO) Take 1 capsule by mouth daily.   Yes [provider]  ?sertraline (ZOLOFT) 100 MG tablet Take 1 tablet (100 mg total) by mouth 2 (two) times daily. 04/03/17  Yes Bonnielee Haff, MD  ?urea (CARMOL) 20 % cream Apply 1 application. topically See admin instructions. Apply to both feet topically every day shift until xerosis resolves, avoid blister on right foot   Yes [provider]  ? ? ?Physical Exam: ?Constitutional: Moderately built and nourished. ?Vitals:  ? 08/13/21 2345 08/14/21 0015 08/14/21 0145 08/14/21 0332  ?BP: 124/65 110/76 (!) 105/55   ?Pulse: (!) 102 (!) 101 98   ?Resp: (!) 22 (!) 22 20   ?Temp:    98.9 ?F (37.2 ?C)  ?TempSrc:    Oral  ?SpO2: 92% 91% 94%   ? ?Eyes: Anicteric no pallor. ?ENMT: No discharge from the ears eyes nose and mouth. ?Neck: No mass felt.  No neck rigidity. ?Respiratory: No rhonchi or crepitations. ?Cardiovascular: S1-S2 heard. ?Abdomen: Soft nontender urostomy seen. ?Musculoskeletal: No edema. ?Skin: No rash. ?Neurologic: Alert awake oriented time place and person.  Has paraplegia. ?Psychiatric: Appears normal.  Normal affect. ? ? ?Labs on Admission: I have personally reviewed following labs and imaging  studies ? ?CBC: ?Recent Labs  ?Lab 08/13/21 ?2239  ?WBC 12.0*  ?NEUTROABS 10.9*  ?HGB 10.7*  ?HCT 37.0  ?MCV 73.0*  ?PLT 221  ? ?Basic Metabolic Panel: ?Recent Labs  ?Lab 08/13/21 ?2239  ?NA 135  ?K 3.5  ?CL 101  ?CO2 25  ?GLUCOSE 148*  ?BUN 12  ?CREATININE 0.62  ?CALCIUM 8.9  ? ?GFR: ?CrCl cannot be calculated (Unknown ideal weight.). ?Liver Function Tests: ?Recent Labs  ?Lab 08/13/21 ?2239  ?AST 26  ?ALT 29  ?ALKPHOS 83  ?BILITOT 0.9  ?PROT 8.0  ?ALBUMIN 4.0  ? ?No results for input(s): LIPASE, AMYLASE in the last 168 hours. ?No results for input(s): AMMONIA in the last 168 hours. ?Coagulation Profile: ?Recent Labs  ?Lab 08/13/21 ?2239  ?INR 1.1  ? ?Cardiac Enzymes: ?No  results for input(s): CKTOTAL, CKMB, CKMBINDEX, TROPONINI in the last 168 hours. ?BNP (last 3 results) ?No results for input(s): PROBNP in the last 8760 hours. ?HbA1C: ?No results for input(s): HGBA1C in the last 72 hours. ?CBG: ?No results for input(s): GLUCAP in the last 168 hours. ?Lipid Profile: ?No results for input(s): CHOL, HDL, LDLCALC, TRIG, CHOLHDL, LDLDIRECT in the last 72 hours. ?Thyroid Function Tests: ?No results for input(s): TSH, T4TOTAL, FREET4, T3FREE, THYROIDAB in the last 72 hours. ?Anemia Panel: ?No results for input(s): VITAMINB12, FOLATE, FERRITIN, TIBC, IRON, RETICCTPCT in the last 72 hours. ?Urine analysis: ?   ?Component Value Date/Time  ? [place] 08/14/2021 0020  ? APPEARANCEUR HAZY (A) 08/14/2021 0020  ? LABSPEC 1.014 08/14/2021 0020  ? PHURINE 7.0 08/14/2021 0020  ? GLUCOSEU NEGATIVE 08/14/2021 0020  ? HGBUR SMALL (A) 08/14/2021 0020  ? [place] NEGATIVE 08/14/2021 0020  ? [place] NEGATIVE 08/14/2021 0020  ? PROTEINUR 30 (A) 08/14/2021 0020  ? UROBILINOGEN 2.0 (H) 10/29/2014 1627  ? NITRITE NEGATIVE 08/14/2021 0020  ? LEUKOCYTESUR MODERATE (A) 08/14/2021 0020  ? ?Sepsis Labs: ?'@LABRCNTIP'$ (procalcitonin:4,lacticidven:4) ?) ?Recent Results (from the past 240 hour(s))  ?Culture, blood (Routine x 2)      Status: None (Preliminary result)  ? Collection Time: 08/14/21 12:29 AM  ? Specimen: BLOOD RIGHT HAND  ?Result Value Ref Range Status  ? Specimen Description   Final  ?  BLOOD RIGHT HAND ?Performed at [facility]

## 2021-08-14 NOTE — Assessment & Plan Note (Signed)
-   Stable.  Etiology due to history of transverse myelitis ?

## 2021-08-14 NOTE — Hospital Course (Addendum)
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  She also endorsed having some "crusting" around her urostomy tubing.  She was recently treated at her nursing facility for [name] UTI which grew Providencia and Proteus.  ? ?On admission there was concern for either ongoing UTI or recurrent UTI.  She was noted to be hypotensive and required multiple fluid boluses.   ? ?She's currently improved on antibiotics (cefepime).  Urine culture with multiple species.  Case discussed with pharmacy.  Planning to discharge with fosfomycin x 2 doses.   ? ?See below for additional details ?

## 2021-08-14 NOTE — Progress Notes (Signed)
Pharmacy Antibiotic Note ? ?[name] is a 41 y.o. female admitted on 08/13/2021 with UTI, possible sepsis.  Pharmacy has been consulted for Cefepime dosing. ? ?Plan: ?Cefepime 2gm IV q8h ?Need for further dosage adjustment appears unlikely at present.   ? ?Will sign off at this time.  Please reconsult if a change in clinical status warrants re-evaluation of dosage. ?  ? ?Temp (24hrs), Avg:99.8 ?F (37.7 ?C), Min:98.9 ?F (37.2 ?C), Max:100.6 ?F (38.1 ?C) ? ?Recent Labs  ?Lab 08/13/21 ?2239 08/14/21 ?0020  ?WBC 12.0*  --   ?CREATININE 0.62  --   ?LATICACIDVEN 2.5* 1.8  ?  ?CrCl cannot be calculated (Unknown ideal weight.).   ? ?Allergies  ?Allergen Reactions  ? Iodine Anaphylaxis  ? Latex Anaphylaxis  ? Other Other (See Comments)  ?  Documented on Order Summary Report - unknown reaction ?Apricot, collard greens, green bean, melon, peaches, turnip greens  ? Polymyxin B Hives  ?  Pt had hives in right arm with infusion of polymyxin on November 26th, 2018  ? Shellfish-Derived Products Anaphylaxis  ?  All seafood  ? Vancomycin Anaphylaxis  ? Aspirin Other (See Comments)  ?  Reaction unknown  ? Strawberry Extract Hives and Rash  ? ? ? ?Thank you for allowing pharmacy to be a part of this patient?s care. ? ?Lona Six, Toribio Harbour, PharmD ?08/14/2021 4:57 AM ? ?

## 2021-08-14 NOTE — Assessment & Plan Note (Signed)
-   continue lamictal  ?

## 2021-08-14 NOTE — Progress Notes (Addendum)
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  She also endorsed having some "crusting" around her urostomy tubing.  She was recently treated at her nursing facility for a UTI which grew Providencia and Proteus.  On admission there was concern for either ongoing UTI or recurrent UTI.  She was noted to be hypotensive and required multiple fluid boluses.  She was started on antibiotics and admitted for further monitoring.  Interval History:  Seen this morning in the ER.  She had no confusion and mentation was at baseline.  She endorsed some crusting associated with her urostomy which is atypical.  With her associated fevers, concern was for UTI.  She was admitted for antibiotics and further work-up.  Assessment and Plan: * Complicated UTI (urinary tract infection) - Appreciate pharmacy assistance.  Prior urine culture grew providencia and proteus at SNF; she was treated with 10 days of Bactrim - for now, continue on cefepime and monitor cultures for abx guidance  Severe sepsis (HCC) - Febrile, tachycardia, tachypnea, lactic acidosis.  Source considered urinary - See UTI as well - Continue antibiotics and follow-up cultures -BP responsive to fluid bolus for now.  If becomes refractory, will need vasopressor support - Continue maintenance rate fluid for now as well  Depression - continue lamictal   History of transverse myelitis - Continue baclofen and Valium for muscle spasms - If blood pressure does continue to downtrend, may need to hold  Paraplegia (HCC) - Stable.  Etiology due to history of transverse myelitis    Old records reviewed in assessment of this patient  Antimicrobials: Cefepime 08/13/2021 >>  current  DVT prophylaxis:  enoxaparin (LOVENOX) injection 40 mg Start: 08/14/21 1000   Code Status:   Code Status: Full Code  Disposition Plan: SNF in 2 to 3 days Status is: Inpatient  Objective: Blood pressure (!) 100/47, pulse 74, temperature 98.2 F (36.8 C), temperature source Oral, resp. rate 16, SpO2 94 %.  Examination:  Physical Exam Constitutional:      General: She is not in acute distress.    Appearance: Normal appearance.  HENT:     Head: Normocephalic and atraumatic.     Mouth/Throat:     Mouth: Mucous membranes are moist.  Eyes:     Extraocular Movements: Extraocular movements intact.  Cardiovascular:     Rate and Rhythm: Normal rate and regular rhythm.  Pulmonary:     Effort: Pulmonary effort is normal.     Breath sounds: Normal breath sounds.  Abdominal:     Comments: Mildly distended with noted obesity.  Bowel sounds present.  Minimal tenderness to palpation nonspecifically  Musculoskeletal:     Cervical back: Normal range of motion and neck supple.     Comments: 2+ bilateral lower extremity edema noted which appears chronic  Neurological:     Mental Status: She is alert.     Comments: Bilateral foot drop.  Mild muscle spasms appreciated in lower extremities with dorsiflexion.  Lower extremity paraplegia noted.  Psychiatric:        Mood and Affect: Mood normal.     Consultants:    Procedures:    Data Reviewed: Results for orders placed or performed during the hospital  encounter of 08/13/21 (from the past 24 hour(s))  Comprehensive metabolic panel     Status: Abnormal   Collection Time: 08/13/21 10:39 PM  Result Value Ref Range   Sodium 135 135 - 145 mmol/L   Potassium 3.5 3.5 - 5.1 mmol/L   Chloride 101 98 - 111 mmol/L   CO2 25 22 - 32 mmol/L   Glucose, Bld 148 (H) 70 - 99 mg/dL   BUN 12 6 - 20 mg/dL   Creatinine, Ser 4.74 0.44 - 1.00 mg/dL   Calcium 8.9 8.9 - 25.9 mg/dL   Total Protein 8.0 6.5 - 8.1 g/dL   Albumin 4.0 3.5 - 5.0 g/dL    AST 26 15 - 41 U/L   ALT 29 0 - 44 U/L   Alkaline Phosphatase 83 38 - 126 U/L   Total Bilirubin 0.9 0.3 - 1.2 mg/dL   GFR, Estimated >56 >38 mL/min   Anion gap 9 5 - 15  Lactic acid, plasma     Status: Abnormal   Collection Time: 08/13/21 10:39 PM  Result Value Ref Range   Lactic Acid, Venous 2.5 (HH) 0.5 - 1.9 mmol/L  CBC with Differential     Status: Abnormal   Collection Time: 08/13/21 10:39 PM  Result Value Ref Range   WBC 12.0 (H) 4.0 - 10.5 K/uL   RBC 5.07 3.87 - 5.11 MIL/uL   Hemoglobin 10.7 (L) 12.0 - 15.0 g/dL   HCT 75.6 43.3 - 29.5 %   MCV 73.0 (L) 80.0 - 100.0 fL   MCH 21.1 (L) 26.0 - 34.0 pg   MCHC 28.9 (L) 30.0 - 36.0 g/dL   RDW 18.8 (H) 41.6 - 60.6 %   Platelets 221 150 - 400 K/uL   nRBC 0.0 0.0 - 0.2 %   Neutrophils Relative % 91 %   Neutro Abs 10.9 (H) 1.7 - 7.7 K/uL   Lymphocytes Relative 5 %   Lymphs Abs 0.6 (L) 0.7 - 4.0 K/uL   Monocytes Relative 3 %   Monocytes Absolute 0.4 0.1 - 1.0 K/uL   Eosinophils Relative 1 %   Eosinophils Absolute 0.1 0.0 - 0.5 K/uL   Basophils Relative 0 %   Basophils Absolute 0.0 0.0 - 0.1 K/uL   Immature Granulocytes 0 %   Abs Immature Granulocytes 0.05 0.00 - 0.07 K/uL  Protime-INR     Status: None   Collection Time: 08/13/21 10:39 PM  Result Value Ref Range   Prothrombin Time 14.1 11.4 - 15.2 seconds   INR 1.1 0.8 - 1.2  Culture, blood (Routine x 2)     Status: None (Preliminary result)   Collection Time: 08/13/21 10:39 PM   Specimen: BLOOD  Result Value Ref Range   Specimen Description      BLOOD LEFT ANTECUBITAL Performed at [facility], [address], [place], [place] [address]    Special Requests      BOTTLES DRAWN AEROBIC AND ANAEROBIC Blood Culture results may not be optimal due to an excessive volume of blood received in culture bottles Performed at [facility], [address], [place], [place] [address]    Culture      NO GROWTH < 12 HOURS Performed at [facility], [address] [place], [place] [address]    Report Status PENDING   hCG, quantitative, pregnancy     Status: None   Collection Time: 08/13/21 10:39 PM  Result Value Ref Range   hCG, Beta Chain, Quant, S <  1 <5 mIU/mL  Lactic acid, plasma     Status: None   Collection Time: 08/14/21 12:20 AM  Result Value Ref Range   Lactic Acid, Venous 1.8 0.5 - 1.9 mmol/L  Urinalysis, Routine w reflex microscopic Urine, Catheterized     Status: Abnormal   Collection Time: 08/14/21 12:20 AM  Result Value Ref Range   Color, Urine YELLOW YELLOW   APPearance HAZY (A) CLEAR   Specific Gravity, Urine 1.014 1.005 - 1.030   pH 7.0 5.0 - 8.0   Glucose, UA NEGATIVE NEGATIVE mg/dL   Hgb urine dipstick SMALL (A) NEGATIVE   Bilirubin Urine NEGATIVE NEGATIVE   Ketones, ur NEGATIVE NEGATIVE mg/dL   Protein, ur 30 (A) NEGATIVE mg/dL   Nitrite NEGATIVE NEGATIVE   Leukocytes,Ua MODERATE (A) NEGATIVE   RBC / HPF 0-5 0 - 5 RBC/hpf   WBC, UA 21-50 0 - 5 WBC/hpf   Bacteria, UA RARE (A) NONE SEEN   Mucus PRESENT   Culture, blood (Routine x 2)     Status: None (Preliminary result)   Collection Time: 08/14/21 12:29 AM   Specimen: BLOOD RIGHT HAND  Result Value Ref Range   Specimen Description      BLOOD RIGHT HAND Performed at [facility], [address], [place], [place] [address]    Special Requests      BOTTLES DRAWN AEROBIC AND ANAEROBIC Blood Culture adequate volume Performed at [facility], [address], [place], [place] [address]    Culture      NO GROWTH < 12 HOURS Performed at [facility], [address] [place], [place] [address]    Report Status PENDING   HIV Antibody (routine testing w rflx)     Status: None   Collection Time: 08/14/21  4:52 AM  Result Value Ref Range   HIV Screen 4th Generation wRfx Non Reactive Non Reactive  CBC     Status: Abnormal   Collection Time: 08/14/21  4:52 AM  Result Value Ref Range   WBC 8.6 4.0 - 10.5 K/uL   RBC  4.12 3.87 - 5.11 MIL/uL   Hemoglobin 8.6 (L) 12.0 - 15.0 g/dL   HCT 91.4 (L) 78.2 - 95.6 %   MCV 71.6 (L) 80.0 - 100.0 fL   MCH 20.9 (L) 26.0 - 34.0 pg   MCHC 29.2 (L) 30.0 - 36.0 g/dL   RDW 21.3 (H) 08.6 - 57.8 %   Platelets 199 150 - 400 K/uL   nRBC 0.0 0.0 - 0.2 %  Creatinine, serum     Status: None   Collection Time: 08/14/21  4:52 AM  Result Value Ref Range   Creatinine, Ser 0.47 0.44 - 1.00 mg/dL   GFR, Estimated >46 >96 mL/min  CBG monitoring, ED     Status: Abnormal   Collection Time: 08/14/21  6:36 AM  Result Value Ref Range   Glucose-Capillary 120 (H) 70 - 99 mg/dL    I have Reviewed nursing notes, Vitals, and Lab results since pt's last encounter. Pertinent lab results : see above I have ordered test including BMP, CBC, Mg I have reviewed the last note from staff over past 24 hours I have discussed pt's care plan and test results with nursing staff, case manager   LOS: 0 days   Lewie Chamber, MD [facility] 08/14/2021, 2:07 PM
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
## 2021-08-14 NOTE — ED Notes (Signed)
Pt was provided perineal care, new brief applied, pt repositioned to comfort. ?

## 2021-08-14 NOTE — Consult Note (Signed)
WOC Nurse Consult Note: ?Patient receiving care in [place]. Primary RN at bedside for sacral assessment. ?Reason for Consult: "sacral wound" ?Wound type: There is NO wound to the sacrum. There was a small foam dressing to the left ischial area, but NO wound found when the dressing was removed. The patient tells me this is a former surgical site and the foam dressing is for "protection" ?Pressure Injury POA: NA ?Measurement: ?Wound bed: ?Drainage (amount, consistency, odor)  ?Periwound: ?Dressing procedure/placement/frequency: ?Conservative sharp wound debridement (CSWD performed at the bedside): ? ? ? [place] Nurse ostomy follow up ?Stoma type/location: RUQ urostomy ?Stomal assessment/size: approximately 1 inch, round, budded, pink ?Peristomal assessment: deferred, supplies must be obtained ?Treatment options for stomal/peristomal skin: barrier ring ?Output: yellow urine  ?Ostomy pouching: 1pc., flat, cut to fit ?Pouch, Lawson #3; barrier rings, Kellie Simmering [phone] ? ?Monitor the wound area(s) for worsening of condition such as: ?Signs/symptoms of infection,  ?Increase in size,  ?Development of or worsening of odor, ?Development of pain, or increased pain at the affected locations.  Notify the medical team if any of these develop. ? ?Thank you for the consult.  Discussed plan of care with the patient and bedside nurse.  [place] nurse will not follow at this time.  Please re-consult the [place] team if needed. ? ?Val Riles, RN, MSN, CWOCN, CNS-BC, pager [phone]  ?

## 2021-08-14 NOTE — Assessment & Plan Note (Addendum)
-   Appreciate pharmacy assistance.  Prior urine culture grew providencia and proteus at SNF; she was treated with 10 days of Bactrim --- per discussion with pharmacy 4/17 ucx: 50-100k providencia (R-amp, cipro, gent, levaquin, tobramycin), 10-50k proteus (R-cipro, levaquin, bactrim) ?- her urine culture from the SNF 5/15 is currently pending ?- given improvement on cefepime as noted above, will discharge with fosfomycin today and 5/19.  Need to follow up final culture results at SNF. ?

## 2021-08-14 NOTE — Assessment & Plan Note (Addendum)
-   Febrile, tachycardia, tachypnea, lactic acidosis.  Source considered urinary ?- See UTI as well ?-- sepsis physiology resolved ?-- s/p 4 days of cefepime.  Urine culture here with multiple species.  Urine culture currently pending from facility.  Will give dose of fosfomycin prior to discharge and then again on Friday. ?- blood cultures no growth ?-- BP borderline, fluctuating - suspect this is close to her baseline - she's asymptomatic - follow outpatient ?

## 2021-08-14 NOTE — ED Notes (Signed)
ED TO INPATIENT HANDOFF REPORT ? ?Name/Age/Gender ?[name] ?41 y.o. ?female ? ?Code Status ? ?  ?Code Status Orders  ?(From admission, onward)  ?  ? ? ?  ? ?  Start     Ordered  ? 08/14/21 0453  Full code  Continuous       ? 08/14/21 0453  ? ?  ?  ? ?  ? ?Code Status History   ? ? Date Active Date Inactive Code Status Order ID Comments User Context  ? 03/08/2018 0128 03/12/2018 1405 Full Code [id number]  Gwynne Edinger, MD Inpatient  ? 01/02/2018 0028 01/04/2018 1909 Full Code [id number]  Lenore Cordia, MD ED  ? 04/24/2017 2310 04/30/2017 1508 Full Code [id number]  Elmarie Shiley, MD Inpatient  ? 03/26/2017 0453 04/03/2017 2111 Full Code [id number]  [name], DO ED  ? 02/14/2017 0100 03/12/2017 1736 Full Code [id number]  Phillips Grout, MD Inpatient  ? 11/15/2016 0245 11/17/2016 1848 Full Code [id number]  [name], MD Inpatient  ? 07/17/2016 2321 07/19/2016 2047 Full Code [id number]  Ivor Costa, MD ED  ? 02/15/2016 2217 02/25/2016 2001 Full Code [id number]  [name], MD Inpatient  ? 01/07/2016 0958 01/09/2016 1424 Full Code [id number]  [name], MD Inpatient  ? 10/29/2014 2114 10/31/2014 1711 Full Code [id number]  [name], MD Inpatient  ? 09/27/2014 2249 09/28/2014 2046 Full Code [id number]  [name], DO ED  ? 03/30/2013 1215 04/02/2013 1732 Full Code [id number]  [name], MD Inpatient  ? 02/16/2012 0146 02/18/2012 1853 Full Code [id number]  [name]., DO ED  ? ?  ? ? ?Home/SNF/Other ?Nursing Home ? ?Chief Complaint ?Complicated UTI (urinary tract infection) [N39.0] ? ?Level of Care/Admitting Diagnosis ?ED Disposition   ? ? ED Disposition  ?Admit  ? Condition  ?--  ? Comment  ?Hospital Area: [facility] [[id number]] ? Level of Care: Telemetry [5] ? Admit to tele based on following criteria: Monitor for Ischemic changes ? May place patient in observation at [facility] or [place] if equivalent level of care is available:: No ? Covid  Evaluation: Asymptomatic - no recent exposure (last 10 days) testing not required ? Diagnosis: Complicated UTI (urinary tract infection) [622633] ? Admitting Physician: Rise Patience [phone] ? Attending Physician: Rise Patience [phone] ?  ?  ? ?  ? ? ?Medical History ?Past Medical History:  ?Diagnosis Date  ? Bladder stones   ? Hx of  ? Depression   ? GERD (gastroesophageal reflux disease)   ? Intertrigo 04/24/2017  ? Left upper extremity swelling 04/24/2017  ? Multiple sclerosis ([place])   ? Paraplegia ([place])   ? PTSD (post-traumatic stress disorder)   ? Transverse myelitis ([place])   ? Yeast infection 09/26/2017  ? ? ?Allergies ?Allergies  ?Allergen Reactions  ? Iodine Anaphylaxis  ? Latex Anaphylaxis  ? Other Other (See Comments)  ?  Documented on Order Summary Report - unknown reaction ?Apricot, collard greens, green bean, melon, peaches, turnip greens  ? Polymyxin B Hives  ?  Pt had hives in right arm with infusion of polymyxin on November 26th, 2018  ? Shellfish-Derived Products Anaphylaxis  ?  All seafood  ? Vancomycin Anaphylaxis  ? Aspirin Other (See Comments)  ?  Reaction unknown  ? Strawberry Extract Hives and Rash  ? ? ?IV Location/Drains/Wounds ?Patient Lines/Drains/Airways Status   ? ? Active Line/Drains/Airways   ? ? Name Placement date Placement time Site  Days  ? Peripheral IV 08/13/21 20 G Left Antecubital 08/13/21  2302  Antecubital  1  ? Urostomy Ureterostomy right RLQ --  --  RLQ  --  ? Wound / Incision (Open or Dehisced) 01/02/18 Perineum Right;Left reddened peri area 01/02/18  0200  Perineum  1320  ? ?  ?  ? ?  ? ? ?Labs/Imaging ?Results for orders placed or performed during the hospital encounter of 08/13/21 (from the past 48 hour(s))  ?Comprehensive metabolic panel     Status: Abnormal  ? Collection Time: 08/13/21 10:39 PM  ?Result Value Ref Range  ? Sodium 135 135 - 145 mmol/L  ? Potassium 3.5 3.5 - 5.1 mmol/L  ? Chloride 101 98 - 111 mmol/L  ? CO2 25 22 - 32 mmol/L  ? Glucose, Bld 148 (H)  70 - 99 mg/dL  ?  Comment: Glucose reference range applies only to samples taken after fasting for at least 8 hours.  ? BUN 12 6 - 20 mg/dL  ? Creatinine, Ser 0.62 0.44 - 1.00 mg/dL  ? Calcium 8.9 8.9 - 10.3 mg/dL  ? Total Protein 8.0 6.5 - 8.1 g/dL  ? Albumin 4.0 3.5 - 5.0 g/dL  ? AST 26 15 - 41 U/L  ? ALT 29 0 - 44 U/L  ? Alkaline Phosphatase 83 38 - 126 U/L  ? Total Bilirubin 0.9 0.3 - 1.2 mg/dL  ? GFR, Estimated >60 >60 mL/min  ?  Comment: (NOTE) ?Calculated using the CKD-EPI Creatinine Equation (2021) ?  ? Anion gap 9 5 - 15  ?  Comment: Performed at [facility], [place] [address] [place], [place] [address]  ?Lactic acid, plasma     Status: Abnormal  ? Collection Time: 08/13/21 10:39 PM  ?Result Value Ref Range  ? Lactic Acid, Venous 2.5 (HH) 0.5 - 1.9 mmol/L  ?  Comment: CRITICAL RESULT CALLED TO, READ BACK BY AND VERIFIED WITH: ?NATASHA, RN @ 4782 ON 08/13/2021 BY LBROOKS, MLT ?Performed at [facility], [place] [address] [place], [place] [address] ?  ?CBC with Differential     Status: Abnormal  ? Collection Time: 08/13/21 10:39 PM  ?Result Value Ref Range  ? WBC 12.0 (H) 4.0 - 10.5 K/uL  ? RBC 5.07 3.87 - 5.11 MIL/uL  ? Hemoglobin 10.7 (L) 12.0 - 15.0 g/dL  ? HCT 37.0 36.0 - 46.0 %  ? MCV 73.0 (L) 80.0 - 100.0 fL  ? MCH 21.1 (L) 26.0 - 34.0 pg  ? MCHC 28.9 (L) 30.0 - 36.0 g/dL  ? RDW 19.9 (H) 11.5 - 15.5 %  ? Platelets 221 150 - 400 K/uL  ? nRBC 0.0 0.0 - 0.2 %  ? Neutrophils Relative % 91 %  ? Neutro Abs 10.9 (H) 1.7 - 7.7 K/uL  ? Lymphocytes Relative 5 %  ? Lymphs Abs 0.6 (L) 0.7 - 4.0 K/uL  ? Monocytes Relative 3 %  ? Monocytes Absolute 0.4 0.1 - 1.0 K/uL  ? Eosinophils Relative 1 %  ? Eosinophils Absolute 0.1 0.0 - 0.5 K/uL  ? Basophils Relative 0 %  ? Basophils Absolute 0.0 0.0 - 0.1 K/uL  ? Immature Granulocytes 0 %  ? Abs Immature Granulocytes 0.05 0.00 - 0.07 K/uL  ?  Comment: Performed at [facility], [place] [address] [place],  [address]   ?Protime-INR     Status: None  ? Collection Time: 08/13/21 10:39 PM  ?Result Value Ref Range  ? Prothrombin Time 14.1  11.4 - 15.2 seconds  ? INR 1.1 0.8 - 1.2  ?  Comment: (NOTE) ?INR goal varies based on device and disease states. ?Performed at [facility], [address] ?[place], [place] [address] ?  ?hCG, quantitative, pregnancy     Status: None  ? Collection Time: 08/13/21 10:39 PM  ?Result Value Ref Range  ? hCG, Beta Chain, Quant, S <1 <5 mIU/mL  ?  Comment:        ?  GEST. AGE      CONC.  (mIU/mL) ?  <=1 WEEK        5 - 50 ?    2 WEEKS       50 - 500 ?    3 WEEKS       100 - 10,000 ?    4 WEEKS     1,000 - 30,000 ?    5 WEEKS     3,500 - 115,000 ?  6-8 WEEKS     12,000 - 270,000 ?   12 WEEKS     15,000 - 220,000 ?       ?FEMALE AND NON-PREGNANT FEMALE: ?    LESS THAN 5 mIU/mL ?Performed at [facility], [place] [address], [place], [place] [address] ?  ?Lactic acid, plasma     Status: None  ? Collection Time: 08/14/21 12:20 AM  ?Result Value Ref Range  ? Lactic Acid, Venous 1.8 0.5 - 1.9 mmol/L  ?  Comment: Performed at [facility], [place] [address], [place], [place] [address]  ?Urinalysis, Routine w reflex microscopic Urine, Catheterized     Status: Abnormal  ? Collection Time: 08/14/21 12:20 AM  ?Result Value Ref Range  ? Color, Urine YELLOW YELLOW  ? APPearance HAZY (A) CLEAR  ? Specific Gravity, Urine 1.014 1.005 - 1.030  ? pH 7.0 5.0 - 8.0  ? Glucose, UA NEGATIVE NEGATIVE mg/dL  ? Hgb urine dipstick SMALL (A) NEGATIVE  ? Bilirubin Urine NEGATIVE NEGATIVE  ? Ketones, ur NEGATIVE NEGATIVE mg/dL  ? Protein, ur 30 (A) NEGATIVE mg/dL  ? Nitrite NEGATIVE NEGATIVE  ? Leukocytes,Ua MODERATE (A) NEGATIVE  ? RBC / HPF 0-5 0 - 5 RBC/hpf  ? WBC, UA 21-50 0 - 5 WBC/hpf  ? Bacteria, UA RARE (A) NONE SEEN  ? Mucus PRESENT   ?  Comment: Performed at [facility], [place] [address], [place], [place] [address]  ?Culture, blood (Routine x 2)     Status:  None (Preliminary result)  ? Collection Time: 08/14/21 12:29 AM  ? Specimen: BLOOD RIGHT HAND  ?Result Value Ref Range  ? Specimen Description    ?  BLOOD RIGHT HAND ?Performed at [place]

## 2021-08-14 NOTE — ED Notes (Signed)
Pt placed on 2 L/M  due to SpO2 readings of RA at 89-90%, with good waveform. ?

## 2021-08-14 NOTE — Assessment & Plan Note (Addendum)
-   Continue baclofen and Valium for muscle spasms ?

## 2021-08-15 DIAGNOSIS — N390 Urinary tract infection, site not specified: Secondary | ICD-10-CM | POA: Diagnosis not present

## 2021-08-15 DIAGNOSIS — L8990 Pressure ulcer of unspecified site, unspecified stage: Secondary | ICD-10-CM | POA: Insufficient documentation

## 2021-08-15 LAB — COMPREHENSIVE METABOLIC PANEL
ALT: 20 U/L (ref 0–44)
AST: 13 U/L — ABNORMAL LOW (ref 15–41)
Albumin: 3 g/dL — ABNORMAL LOW (ref 3.5–5.0)
Alkaline Phosphatase: 57 U/L (ref 38–126)
Anion gap: 9 (ref 5–15)
BUN: 9 mg/dL (ref 6–20)
CO2: 24 mmol/L (ref 22–32)
Calcium: 8.6 mg/dL — ABNORMAL LOW (ref 8.9–10.3)
Chloride: 109 mmol/L (ref 98–111)
Creatinine, Ser: 0.46 mg/dL (ref 0.44–1.00)
GFR, Estimated: >60 mL/min (ref >60)
Glucose, Bld: 124 mg/dL — ABNORMAL HIGH (ref 70–99)
Potassium: 3.4 mmol/L — ABNORMAL LOW (ref 3.5–5.1)
Sodium: 142 mmol/L (ref 135–145)
Total Bilirubin: 0.5 mg/dL (ref 0.3–1.2)
Total Protein: 6.3 g/dL — ABNORMAL LOW (ref 6.5–8.1)

## 2021-08-15 LAB — CBC WITH DIFFERENTIAL/PLATELET
Abs Immature Granulocytes: 0.02 10*3/uL (ref 0.00–0.07)
Basophils Absolute: 0 10*3/uL (ref 0.0–0.1)
Basophils Relative: 0 %
Eosinophils Absolute: 0.1 10*3/uL (ref 0.0–0.5)
Eosinophils Relative: 2 %
HCT: 30.8 % — ABNORMAL LOW (ref 36.0–46.0)
Hemoglobin: 8.7 g/dL — ABNORMAL LOW (ref 12.0–15.0)
Immature Granulocytes: 0 %
Lymphocytes Relative: 31 %
Lymphs Abs: 1.9 10*3/uL (ref 0.7–4.0)
MCH: 20.9 pg — ABNORMAL LOW (ref 26.0–34.0)
MCHC: 28.2 g/dL — ABNORMAL LOW (ref 30.0–36.0)
MCV: 73.9 fL — ABNORMAL LOW (ref 80.0–100.0)
Monocytes Absolute: 0.1 10*3/uL (ref 0.1–1.0)
Monocytes Relative: 1 %
Neutro Abs: 4 10*3/uL (ref 1.7–7.7)
Neutrophils Relative %: 66 %
Platelets: 211 10*3/uL (ref 150–400)
RBC: 4.17 MIL/uL (ref 3.87–5.11)
RDW: 19.4 % — ABNORMAL HIGH (ref 11.5–15.5)
WBC: 6.2 10*3/uL (ref 4.0–10.5)
nRBC: 0 % (ref 0.0–0.2)

## 2021-08-15 LAB — URINE CULTURE

## 2021-08-15 LAB — CORTISOL: Cortisol, Plasma: 5.4 ug/dL

## 2021-08-15 LAB — IRON AND TIBC
Iron: 16 ug/dL — ABNORMAL LOW (ref 28–170)
Saturation Ratios: 5 % — ABNORMAL LOW (ref 10.4–31.8)
TIBC: 309 ug/dL (ref 250–450)
UIBC: 293 ug/dL

## 2021-08-15 LAB — MAGNESIUM: Magnesium: 2 mg/dL (ref 1.7–2.4)

## 2021-08-15 LAB — FERRITIN: Ferritin: 49 ng/mL (ref 11–307)

## 2021-08-15 MED ORDER — DIAZEPAM 5 MG PO TABS: 5.0000 mg | ORAL_TABLET | Freq: Two times a day (BID) | ORAL | Status: DC | PRN

## 2021-08-15 MED ORDER — GABAPENTIN 400 MG PO CAPS
800.0000 mg | ORAL_CAPSULE | Freq: Three times a day (TID) | ORAL | Status: DC
  Administered 2021-08-15 – 2021-08-16 (×6): 800 mg via ORAL
  Filled 2021-08-15 (×6): qty 2

## 2021-08-15 MED ORDER — POTASSIUM CHLORIDE CRYS ER 20 MEQ PO TBCR
40.0000 meq | EXTENDED_RELEASE_TABLET | Freq: Once | ORAL | Status: AC
  Administered 2021-08-15: 40 meq via ORAL
  Filled 2021-08-15: qty 2

## 2021-08-15 MED ORDER — FERROUS SULFATE 325 (65 FE) MG PO TABS
325.0000 mg | ORAL_TABLET | Freq: Every day | ORAL | Status: DC
Start: 2021-08-15 — End: 2021-08-17
  Administered 2021-08-15 – 2021-08-16 (×2): 325 mg via ORAL
  Filled 2021-08-15 (×2): qty 1

## 2021-08-15 MED ORDER — BACLOFEN 20 MG PO TABS
20.0000 mg | ORAL_TABLET | Freq: Four times a day (QID) | ORAL | Status: DC
Start: 2021-08-15 — End: 2021-08-17
  Administered 2021-08-15 – 2021-08-16 (×8): 20 mg via ORAL
  Filled 2021-08-15 (×8): qty 1

## 2021-08-15 MED ORDER — LACTATED RINGERS IV SOLN: INTRAVENOUS | Status: AC

## 2021-08-15 MED ORDER — PROCHLORPERAZINE EDISYLATE 10 MG/2ML IJ SOLN
10.0000 mg | Freq: Four times a day (QID) | INTRAMUSCULAR | Status: DC | PRN
Start: 2021-08-15 — End: 2021-08-17
  Administered 2021-08-15: 10 mg via INTRAVENOUS
  Filled 2021-08-15: qty 2

## 2021-08-15 NOTE — Progress Notes (Signed)
Patient verbalized that she is OK and comfortable to return to [facility] upon D/C.  ?

## 2021-08-15 NOTE — Progress Notes (Signed)
?Progress Note ? ? ? ?[name]   ?XTK:[id number]  ?DOB: November 18, 1980  ?DOA: 08/13/2021     1 ?PCP: Lennie Odor, PA ? ?Initial CC: fever, chills ? ?Hospital Course: ?Ms. 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  She also endorsed having some "crusting" around her urostomy tubing.  She was recently treated at her nursing facility for a UTI which grew Providencia and Proteus.  ?On admission there was concern for either ongoing UTI or recurrent UTI.  She was noted to be hypotensive and required multiple fluid boluses.  She was started on antibiotics and admitted for further monitoring. ? ?Interval History:  ?No events overnight.  Feels a little better this morning.  Appetite seems to be okay. She did endorse some nausea which may also be due to having had to hold some home meds due to her hypotension yesterday. These are being resumed this morning.  ? ?Assessment and Plan: ?* Complicated UTI (urinary tract infection) ?- Appreciate pharmacy assistance.  Prior urine culture grew providencia and proteus at SNF; she was treated with 10 days of Bactrim ?- for now, continue on cefepime and monitor cultures for abx guidance ? ?Severe sepsis ([place]) ?- Febrile, tachycardia, tachypnea, lactic acidosis.  Source considered urinary ?- See UTI as well ?- Continue antibiotics and follow-up cultures ?-Borderline low blood pressure on admission almost requiring vasopressors but has improved this morning ?-Continue maintenance fluids for 1 more day ? ?Depression ?- continue lamictal  ? ?History of transverse myelitis ?- Continue baclofen and Valium for muscle spasms ? ?Paraplegia ([place]) ?- Stable.  Etiology due to history of transverse myelitis ? ? ? ?Old records reviewed in assessment of this patient ? ?Antimicrobials: ?Cefepime 08/13/2021 >> current ? ?DVT prophylaxis:  ?enoxaparin (LOVENOX) injection 40 mg Start:  08/14/21 1000 ? ? ?Code Status:   Code Status: Full Code ? ?Disposition Plan: SNF in 2 to 3 days ?Status is: Inpatient ? ?Objective: ?Blood pressure 112/63, pulse 85, temperature 98.3 ?F (36.8 ?C), temperature source Oral, resp. rate 18, height '5\' 3"'$  (1.6 m), weight 89.5 kg, SpO2 97 %.  ?Examination:  ?Physical Exam ?Constitutional:   ?   General: She is not in acute distress. ?   Appearance: Normal appearance.  ?HENT:  ?   Head: Normocephalic and atraumatic.  ?   Mouth/Throat:  ?   Mouth: Mucous membranes are moist.  ?Eyes:  ?   Extraocular Movements: Extraocular movements intact.  ?Cardiovascular:  ?   Rate and Rhythm: Normal rate and regular rhythm.  ?Pulmonary:  ?   Effort: Pulmonary effort is normal.  ?   Breath sounds: Normal breath sounds.  ?Abdominal:  ?   Comments: Mildly distended with noted obesity.  Bowel sounds present.  Minimal tenderness to palpation nonspecifically  ?Musculoskeletal:  ?   Cervical back: Normal range of motion and neck supple.  ?   Comments: 2+ bilateral lower extremity edema noted which appears chronic  ?Neurological:  ?   Mental Status: She is alert.  ?   Comments: Bilateral foot drop.  Mild muscle spasms appreciated in lower extremities with dorsiflexion.  Lower extremity paraplegia noted.  ?Psychiatric:     ?   Mood and Affect: Mood normal.  ?  ? ?Consultants:  ? ? ?Procedures:  ? ? ?Data Reviewed: ?Results for orders placed or performed during the hospital encounter of 08/13/21 (from the past 24 hour(s))  ?  MRSA Next Gen by PCR, Nasal     Status: Abnormal  ? Collection Time: 08/14/21  5:30 PM  ? Specimen: Nasal Mucosa; Nasal Swab  ?Result Value Ref Range  ? MRSA by PCR Next Gen DETECTED (A) NOT DETECTED  ?Cortisol     Status: None  ? Collection Time: 08/15/21  3:47 AM  ?Result Value Ref Range  ? Cortisol, Plasma 5.4 ug/dL  ?CBC with Differential/Platelet     Status: Abnormal  ? Collection Time: 08/15/21  3:47 AM  ?Result Value Ref Range  ? WBC 6.2 4.0 - 10.5 K/uL  ? RBC 4.17 3.87  - 5.11 MIL/uL  ? Hemoglobin 8.7 (L) 12.0 - 15.0 g/dL  ? HCT 30.8 (L) 36.0 - 46.0 %  ? MCV 73.9 (L) 80.0 - 100.0 fL  ? MCH 20.9 (L) 26.0 - 34.0 pg  ? MCHC 28.2 (L) 30.0 - 36.0 g/dL  ? RDW 19.4 (H) 11.5 - 15.5 %  ? Platelets 211 150 - 400 K/uL  ? nRBC 0.0 0.0 - 0.2 %  ? Neutrophils Relative % 66 %  ? Neutro Abs 4.0 1.7 - 7.7 K/uL  ? Lymphocytes Relative 31 %  ? Lymphs Abs 1.9 0.7 - 4.0 K/uL  ? Monocytes Relative 1 %  ? Monocytes Absolute 0.1 0.1 - 1.0 K/uL  ? Eosinophils Relative 2 %  ? Eosinophils Absolute 0.1 0.0 - 0.5 K/uL  ? Basophils Relative 0 %  ? Basophils Absolute 0.0 0.0 - 0.1 K/uL  ? Immature Granulocytes 0 %  ? Abs Immature Granulocytes 0.02 0.00 - 0.07 K/uL  ?Comprehensive metabolic panel     Status: Abnormal  ? Collection Time: 08/15/21  3:47 AM  ?Result Value Ref Range  ? Sodium 142 135 - 145 mmol/L  ? Potassium 3.4 (L) 3.5 - 5.1 mmol/L  ? Chloride 109 98 - 111 mmol/L  ? CO2 24 22 - 32 mmol/L  ? Glucose, Bld 124 (H) 70 - 99 mg/dL  ? BUN 9 6 - 20 mg/dL  ? Creatinine, Ser 0.46 0.44 - 1.00 mg/dL  ? Calcium 8.6 (L) 8.9 - 10.3 mg/dL  ? Total Protein 6.3 (L) 6.5 - 8.1 g/dL  ? Albumin 3.0 (L) 3.5 - 5.0 g/dL  ? AST 13 (L) 15 - 41 U/L  ? ALT 20 0 - 44 U/L  ? Alkaline Phosphatase 57 38 - 126 U/L  ? Total Bilirubin 0.5 0.3 - 1.2 mg/dL  ? GFR, Estimated >60 >60 mL/min  ? Anion gap 9 5 - 15  ?Magnesium     Status: None  ? Collection Time: 08/15/21  3:47 AM  ?Result Value Ref Range  ? Magnesium 2.0 1.7 - 2.4 mg/dL  ?Ferritin     Status: None  ? Collection Time: 08/15/21  3:47 AM  ?Result Value Ref Range  ? Ferritin 49 11 - 307 ng/mL  ?Iron and TIBC     Status: Abnormal  ? Collection Time: 08/15/21  3:47 AM  ?Result Value Ref Range  ? Iron 16 (L) 28 - 170 ug/dL  ? TIBC 309 250 - 450 ug/dL  ? Saturation Ratios 5 (L) 10.4 - 31.8 %  ? UIBC 293 ug/dL  ?  ?I have Reviewed nursing notes, Vitals, and Lab results since pt's last encounter. Pertinent lab results : see above ?I have ordered test including BMP, CBC, Mg ?I have  reviewed the last note from staff over past 24 hours ?I have discussed pt's care plan and test results with nursing  staff, case manager ? ? LOS: 1 day  ? ?Dwyane Dee, MD ?[facility] ?08/15/2021, 2:28 PM ?

## 2021-08-15 NOTE — NC FL2 (Signed)
?[place] MEDICAID FL2 LEVEL OF CARE SCREENING TOOL  ?  ? ?IDENTIFICATION  ?Patient Name: ?[name] Birthdate: 19-Dec-1980 Sex: female Admission Date (Current Location): ?08/13/2021  ?[place] and [place] Number: ? [place] ?  Facility and Address:  ?[facility],  [place], [place] ?     Provider Number: ?[id number]  ?Attending Physician Name and Address:  ?Dwyane Dee, MD ? Relative Name and Phone Number:  ?Semidey,Carolyn Mother [phone]  [phone] ?   ?Current Level of Care: ?Hospital Recommended Level of Care: ?[place] Prior Approval Number: ?  ? ?Date Approved/Denied: ?  PASRR Number: ?[id number] B ? ?Discharge Plan: ?SNF ?  ? ?Current Diagnoses: ?Patient Active Problem List  ? Diagnosis Date Noted  ? Pressure injury of skin 08/15/2021  ? Complicated UTI (urinary tract infection) 08/14/2021  ? Depression 08/14/2021  ? UTI (urinary tract infection) 01/01/2018  ? Yeast infection 09/26/2017  ? Intertrigo 04/24/2017  ? Left upper extremity swelling 04/24/2017  ? DVT (deep venous thrombosis) ([place]) 04/24/2017  ? Hypotension due to hypovolemia   ? Thrombocytopenia ([place])   ? Acute kidney injury (nontraumatic) (HCC)   ? Sepsis associated hypotension ([place]) 03/26/2017  ? AKI (acute kidney injury) ([place]) 03/26/2017  ? Somnolence   ? Facial droop   ? MDR Acinetobacter baumannii infection   ? Acinetobacter lwoffi infection   ? Proteus infection   ? MRSA infection   ? Decubitus ulcer of left ischial area, stage III ([place]) 11/15/2016  ? Severe sepsis ([place]) 11/14/2016  ? Diarrhea 07/17/2016  ? Osteomyelitis, pelvis ([place]) 02/16/2016  ? Constipation 10/29/2014  ? Influenza A 03/30/2013  ? Multiple sclerosis ([place]) 10/16/2011  ? Paraplegia ([place])   ? History of transverse myelitis   ? ? ?Orientation RESPIRATION BLADDER Height & Weight   ?  ?Self ? Normal Urostomy Weight: 89.5 kg ?Height:  '5\' 3"'$  (160 cm)  ?BEHAVIORAL SYMPTOMS/MOOD NEUROLOGICAL BOWEL NUTRITION STATUS   ?    Incontinent Diet (Regular)  ?AMBULATORY STATUS COMMUNICATION OF NEEDS Skin   ?Extensive Assist Verbally PU Stage and Appropriate Care (Buttocks and heals not open) ?  ?PU Stage 2 Dressing: Daily ?  ?    ?     ?     ? ? ?Personal Care Assistance Level of Assistance  ?Bathing, Feeding, Dressing Bathing Assistance: Maximum assistance ?Feeding assistance: Limited assistance ?Dressing Assistance: Maximum assistance ?   ? ?Functional Limitations Info  ?Sight, Hearing, Speech Sight Info: Adequate ?Hearing Info: Adequate ?Speech Info: Adequate  ? ? ?SPECIAL CARE FACTORS FREQUENCY  ?PT (By licensed PT), OT (By licensed OT)   ?  ?PT Frequency: Eval and Treat ?OT Frequency: Eval and Treat ?  ?  ?  ?   ? ? ?Contractures Contractures Info: Not present  ? ? ?Additional Factors Info  ?Code Status, Allergies Code Status Info: FULL ?Allergies Info: Iodine, Latex, Other, Polymyxin B, Shellfish-derived Products, Vancomycin, Aspirin, Strawberry Extract ?  ?  ?  ?   ? ?Current Medications (08/15/2021):  This is the current hospital active medication list ?Current Facility-Administered Medications  ?Medication Dose Route Frequency Provider Last Rate Last Admin  ? acetaminophen (TYLENOL) tablet 650 mg  650 mg Oral Q6H PRN Rise Patience, MD   650 mg at 08/15/21 0047  ? Or  ? acetaminophen (TYLENOL) suppository 650 mg  650 mg Rectal Q6H PRN Rise Patience, MD      ? baclofen (LIORESAL) tablet 20 mg  20 mg Oral QID Girguis,  Shanon Brow, MD   20 mg at 08/15/21 1415  ? ceFEPIme (MAXIPIME) 2 g in sodium chloride 0.9 % 100 mL IVPB  2 g Intravenous Q8H Poindexter, Leann T, RPH 200 mL/hr at 08/15/21 1419 2 g at 08/15/21 1419  ? diazepam (VALIUM) tablet 5 mg  5 mg Oral Q12H PRN Dwyane Dee, MD      ? enoxaparin (LOVENOX) injection 40 mg  40 mg Subcutaneous Q24H Rise Patience, MD   40 mg at 08/15/21 1104  ? ferrous sulfate tablet 325 mg  325 mg Oral Q breakfast Dwyane Dee, MD   325 mg at 08/15/21 1104  ? gabapentin  (NEURONTIN) capsule 800 mg  800 mg Oral TID Dwyane Dee, MD   800 mg at 08/15/21 1101  ? lactated ringers infusion   Intravenous Continuous Dwyane Dee, MD      ? lamoTRIgine (LAMICTAL) tablet 75 mg  75 mg Oral BID Rise Patience, MD   75 mg at 08/15/21 1101  ? loratadine (CLARITIN) tablet 10 mg  10 mg Oral Daily Rise Patience, MD   10 mg at 08/15/21 1104  ? ondansetron (ZOFRAN) tablet 4 mg  4 mg Oral Q8H PRN Rise Patience, MD   4 mg at 08/15/21 0745  ? pantoprazole (PROTONIX) EC tablet 40 mg  40 mg Oral Daily Rise Patience, MD   40 mg at 08/15/21 1104  ? prochlorperazine (COMPAZINE) injection 10 mg  10 mg Intravenous Q6H PRN Dwyane Dee, MD   10 mg at 08/15/21 1100  ? sertraline (ZOLOFT) tablet 100 mg  100 mg Oral BID Rise Patience, MD   100 mg at 08/15/21 1103  ? ? ? ?Discharge Medications: ?Please see discharge summary for a list of discharge medications. ? ?Relevant Imaging Results: ? ?Relevant Lab Results: ? ? ?Additional Information ?[phone] ? ?Latrease Kunde, RN ? ? ? ? ?

## 2021-08-15 NOTE — TOC Progression Note (Signed)
Transition of Care (TOC) - Progression Note  ? ? ?Patient Details  ?Name: [name] ?MRN: [id number] ?Date of Birth: September 22, 1980 ? ?Transition of Care (TOC) CM/SW Contact  ?Purcell Mouton, RN ?Phone Number: ?08/15/2021, 3:17 PM ? ?Clinical Narrative:    ? ?Pt from [facility] and will return.  ? ?Expected Discharge Plan: [place] ?Barriers to Discharge: No Barriers Identified ? ?Expected Discharge Plan and Services ?Expected Discharge Plan: [place] ?  ?  ?Post Acute Care Choice: [place] ?Living arrangements for the past 2 months: [place] ?                ?  ?  ?  ?  ?  ?  ?  ?  ?  ?  ? ? ?Social Determinants of Health (SDOH) Interventions ?  ? ?Readmission Risk Interventions ?   ? View : No data to display.  ?  ?  ?  ? ? ?

## 2021-08-16 DIAGNOSIS — N390 Urinary tract infection, site not specified: Secondary | ICD-10-CM | POA: Diagnosis not present

## 2021-08-16 DIAGNOSIS — E669 Obesity, unspecified: Secondary | ICD-10-CM

## 2021-08-16 LAB — CBC WITH DIFFERENTIAL/PLATELET
Abs Immature Granulocytes: 0.03 10*3/uL (ref 0.00–0.07)
Basophils Absolute: 0 10*3/uL (ref 0.0–0.1)
Basophils Relative: 0 %
Eosinophils Absolute: 0.2 10*3/uL (ref 0.0–0.5)
Eosinophils Relative: 3 %
HCT: 29.7 % — ABNORMAL LOW (ref 36.0–46.0)
Hemoglobin: 8.4 g/dL — ABNORMAL LOW (ref 12.0–15.0)
Immature Granulocytes: 1 %
Lymphocytes Relative: 28 %
Lymphs Abs: 1.7 10*3/uL (ref 0.7–4.0)
MCH: 21.2 pg — ABNORMAL LOW (ref 26.0–34.0)
MCHC: 28.3 g/dL — ABNORMAL LOW (ref 30.0–36.0)
MCV: 74.8 fL — ABNORMAL LOW (ref 80.0–100.0)
Monocytes Absolute: 0.3 10*3/uL (ref 0.1–1.0)
Monocytes Relative: 5 %
Neutro Abs: 3.9 10*3/uL (ref 1.7–7.7)
Neutrophils Relative %: 63 %
Platelets: 204 10*3/uL (ref 150–400)
RBC: 3.97 MIL/uL (ref 3.87–5.11)
RDW: 19.6 % — ABNORMAL HIGH (ref 11.5–15.5)
WBC: 6.1 10*3/uL (ref 4.0–10.5)
nRBC: 0 % (ref 0.0–0.2)

## 2021-08-16 LAB — COMPREHENSIVE METABOLIC PANEL
ALT: 17 U/L (ref 0–44)
AST: 15 U/L (ref 15–41)
Albumin: 2.9 g/dL — ABNORMAL LOW (ref 3.5–5.0)
Alkaline Phosphatase: 49 U/L (ref 38–126)
Anion gap: 7 (ref 5–15)
BUN: 7 mg/dL (ref 6–20)
CO2: 25 mmol/L (ref 22–32)
Calcium: 8.4 mg/dL — ABNORMAL LOW (ref 8.9–10.3)
Chloride: 109 mmol/L (ref 98–111)
Creatinine, Ser: 0.41 mg/dL — ABNORMAL LOW (ref 0.44–1.00)
GFR, Estimated: >60 mL/min (ref >60)
Glucose, Bld: 98 mg/dL (ref 70–99)
Potassium: 3.4 mmol/L — ABNORMAL LOW (ref 3.5–5.1)
Sodium: 141 mmol/L (ref 135–145)
Total Bilirubin: 0.4 mg/dL (ref 0.3–1.2)
Total Protein: 6 g/dL — ABNORMAL LOW (ref 6.5–8.1)

## 2021-08-16 LAB — MAGNESIUM: Magnesium: 2 mg/dL (ref 1.7–2.4)

## 2021-08-16 MED ORDER — OXYCODONE HCL 5 MG PO TABS
5.0000 mg | ORAL_TABLET | Freq: Four times a day (QID) | ORAL | Status: DC | PRN
  Administered 2021-08-16: 5 mg via ORAL
  Filled 2021-08-16: qty 1

## 2021-08-16 MED ORDER — FERROUS SULFATE 325 (65 FE) MG PO TABS: 325.0000 mg | ORAL_TABLET | Freq: Every day | ORAL | 0 refills | Status: DC

## 2021-08-16 MED ORDER — FOSFOMYCIN TROMETHAMINE 3 G PO PACK: 3.0000 g | PACK | Freq: Once | ORAL | 0 refills | Status: AC

## 2021-08-16 MED ORDER — FOSFOMYCIN TROMETHAMINE 3 G PO PACK
3.0000 g | PACK | Freq: Once | ORAL | Status: AC
  Administered 2021-08-16: 3 g via ORAL
  Filled 2021-08-16: qty 3

## 2021-08-16 NOTE — Plan of Care (Signed)
  Problem: Education: Goal: Knowledge of General Education information will improve Description: Including pain rating scale, medication(s)/side effects and non-pharmacologic comfort measures Outcome: Adequate for Discharge   Problem: Health Behavior/Discharge Planning: Goal: Ability to manage health-related needs will improve Outcome: Adequate for Discharge   Problem: Clinical Measurements: Goal: Ability to maintain clinical measurements within normal limits will improve Outcome: Adequate for Discharge Goal: Will remain free from infection Outcome: Adequate for Discharge Goal: Diagnostic test results will improve Outcome: Adequate for Discharge Goal: Respiratory complications will improve Outcome: Adequate for Discharge Goal: Cardiovascular complication will be avoided Outcome: Adequate for Discharge   Problem: Activity: Goal: Risk for activity intolerance will decrease Outcome: Adequate for Discharge   Problem: Nutrition: Goal: Adequate nutrition will be maintained Outcome: Adequate for Discharge   Problem: Coping: Goal: Level of anxiety will decrease Outcome: Adequate for Discharge   Problem: Elimination: Goal: Will not experience complications related to bowel motility Outcome: Adequate for Discharge Goal: Will not experience complications related to urinary retention Outcome: Adequate for Discharge   Problem: Pain Managment: Goal: General experience of comfort will improve Outcome: Adequate for Discharge   Problem: Safety: Goal: Ability to remain free from injury will improve Outcome: Adequate for Discharge   Problem: Skin Integrity: Goal: Risk for impaired skin integrity will decrease Outcome: Adequate for Discharge   Problem: Urinary Elimination: Goal: Signs and symptoms of infection will decrease Outcome: Adequate for Discharge   

## 2021-08-16 NOTE — Assessment & Plan Note (Signed)
noted 
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
## 2021-08-16 NOTE — Assessment & Plan Note (Signed)
Continue wound care at SNF ? ?Pressure Injury 08/14/21 Heel Veronica;Distal Stage 1 -  Intact skin with non-blanchable redness of [name] localized area usually over [name] bony prominence. (Active)  ?08/14/21 1510  ?Location: Heel  ?Location Orientation: Veronica;Distal  ?Staging: Stage 1 -  Intact skin with non-blanchable redness of [name] localized area usually over [name] bony prominence.  ?Wound Description (Comments):   ?Present on Admission: Yes  ?   ?Pressure Injury 08/14/21 Foot Right;Distal Deep Tissue Pressure Injury - Purple or maroon localized area of discolored intact skin or blood-filled blister due to damage of underlying soft tissue from pressure and/or shear. (Active)  ?08/14/21 1510  ?Location: Foot  ?Location Orientation: Right;Distal  ?Staging: Deep Tissue Pressure Injury - Purple or maroon localized area of discolored intact skin or blood-filled blister due to damage of underlying soft tissue from pressure and/or shear.  ?Wound Description (Comments):   ?Present on Admission: Yes  ?   ?Pressure Injury 08/14/21 Buttocks Right;Lower Stage 2 -  Partial thickness loss of dermis presenting as [name] shallow open injury with [name] red, pink wound bed without slough. Appears to be rectangular shape, outline Veronica [name] stage 2 imprint (Active)  ?08/14/21 1510  ?Location: Buttocks  ?Location Orientation: Right;Lower  ?Staging: Stage 2 -  Partial thickness loss of dermis presenting as [name] shallow open injury with [name] red, pink wound bed without slough.  ?Wound Description (Comments): Appears to be rectangular shape, outline [name] stage 2 imprint  ?Present on Admission: Yes  ? ? ? ?
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
## 2021-08-16 NOTE — TOC Progression Note (Signed)
Transition of Care (TOC) - Progression Note  ? ? ?Patient Details  ?Name: [name] ?MRN: [id number] ?Date of Birth: 1980/10/13 ? ?Transition of Care (TOC) CM/SW Contact  ?Purcell Mouton, RN ?Phone Number: ?08/16/2021, 4:22 PM ? ?Clinical Narrative:    ? ?[name] was called to inform her that pt would return to [facility]. Mrs. Bias Mother was okay with that. ? ? ?Expected Discharge Plan: [place] ?Barriers to Discharge: No Barriers Identified ? ?Expected Discharge Plan and Services ?Expected Discharge Plan: [place] ?  ?  ?Post Acute Care Choice: [place] ?Living arrangements for the past 2 months: [place] ?Expected Discharge Date: 08/16/21               ?  ?  ?  ?  ?  ?  ?  ?  ?  ?  ? ? ?Social Determinants of Health (SDOH) Interventions ?  ? ?Readmission Risk Interventions ?   ? View : No data to display.  ?  ?  ?  ? ? ?

## 2021-08-16 NOTE — Progress Notes (Signed)
PIV removed.  Report given to [facility], nurse, at [facility].  Discharge paperwork printed and ready for PTAR upon arrival. ? ?Angie Fava, RN  ?

## 2021-08-19 LAB — CULTURE, BLOOD (ROUTINE X 2)
Culture: NO GROWTH
Culture: NO GROWTH
Special Requests: ADEQUATE

## 2021-09-10 ENCOUNTER — Encounter: Payer: Self-pay | Admitting: Certified Nurse Midwife

## 2021-09-20 ENCOUNTER — Other Ambulatory Visit: Payer: Self-pay | Admitting: Urology

## 2021-09-21 ENCOUNTER — Other Ambulatory Visit (HOSPITAL_COMMUNITY): Payer: Self-pay | Admitting: Urology

## 2021-09-21 DIAGNOSIS — N200 Calculus of kidney: Secondary | ICD-10-CM

## 2021-10-18 ENCOUNTER — Other Ambulatory Visit: Payer: Self-pay | Admitting: Student

## 2021-10-18 ENCOUNTER — Other Ambulatory Visit (HOSPITAL_COMMUNITY): Payer: Self-pay | Admitting: Physician Assistant

## 2021-10-18 DIAGNOSIS — N200 Calculus of kidney: Secondary | ICD-10-CM

## 2021-10-19 ENCOUNTER — Ambulatory Visit (HOSPITAL_COMMUNITY)
Admission: RE | Admit: 2021-10-19 | Discharge: 2021-10-19 | Disposition: A | Discharge status: Home / Self Care | Payer: Medicaid Other | Source: Ambulatory Visit | Attending: Urology | Admitting: Urology

## 2021-10-19 ENCOUNTER — Other Ambulatory Visit: Payer: Self-pay | Admitting: Urology

## 2021-10-19 ENCOUNTER — Inpatient Hospital Stay (HOSPITAL_COMMUNITY)
Admission: RE | Admit: 2021-10-19 | Discharge: 2021-10-25 | DRG: 660 | Disposition: A | Discharge status: Skilled Nursing Facility | Payer: Medicaid Other | Source: Skilled Nursing Facility | Attending: Urology | Admitting: Urology

## 2021-10-19 ENCOUNTER — Inpatient Hospital Stay (HOSPITAL_COMMUNITY): Admission: RE | Admit: 2021-10-19 | Payer: Medicaid Other | Source: Home / Self Care | Admitting: Urology

## 2021-10-19 ENCOUNTER — Other Ambulatory Visit: Payer: Self-pay

## 2021-10-19 ENCOUNTER — Encounter (HOSPITAL_COMMUNITY): Payer: Self-pay

## 2021-10-19 VITALS — BP 116/65 | HR 85 | Temp 98.5°F | Resp 20 | Ht 63.0 in | Wt 190.7 lb

## 2021-10-19 DIAGNOSIS — F4310 Post-traumatic stress disorder, unspecified: Secondary | ICD-10-CM

## 2021-10-19 DIAGNOSIS — D72829 Elevated white blood cell count, unspecified: Secondary | ICD-10-CM | POA: Diagnosis present

## 2021-10-19 DIAGNOSIS — E876 Hypokalemia: Secondary | ICD-10-CM | POA: Diagnosis present

## 2021-10-19 DIAGNOSIS — Z8249 Family history of ischemic heart disease and other diseases of the circulatory system: Secondary | ICD-10-CM

## 2021-10-19 DIAGNOSIS — D649 Anemia, unspecified: Secondary | ICD-10-CM | POA: Diagnosis present

## 2021-10-19 DIAGNOSIS — Z91041 Radiographic dye allergy status: Secondary | ICD-10-CM

## 2021-10-19 DIAGNOSIS — N319 Neuromuscular dysfunction of bladder, unspecified: Secondary | ICD-10-CM | POA: Diagnosis present

## 2021-10-19 DIAGNOSIS — Z823 Family history of stroke: Secondary | ICD-10-CM

## 2021-10-19 DIAGNOSIS — N200 Calculus of kidney: Secondary | ICD-10-CM | POA: Insufficient documentation

## 2021-10-19 DIAGNOSIS — Z833 Family history of diabetes mellitus: Secondary | ICD-10-CM

## 2021-10-19 DIAGNOSIS — G373 Acute transverse myelitis in demyelinating disease of central nervous system: Secondary | ICD-10-CM | POA: Diagnosis present

## 2021-10-19 DIAGNOSIS — K219 Gastro-esophageal reflux disease without esophagitis: Secondary | ICD-10-CM

## 2021-10-19 DIAGNOSIS — G8220 Paraplegia, unspecified: Secondary | ICD-10-CM | POA: Diagnosis present

## 2021-10-19 DIAGNOSIS — R5082 Postprocedural fever: Secondary | ICD-10-CM | POA: Diagnosis not present

## 2021-10-19 DIAGNOSIS — G801 Spastic diplegic cerebral palsy: Secondary | ICD-10-CM | POA: Diagnosis present

## 2021-10-19 DIAGNOSIS — Z993 Dependence on wheelchair: Secondary | ICD-10-CM

## 2021-10-19 DIAGNOSIS — R509 Fever, unspecified: Secondary | ICD-10-CM | POA: Diagnosis not present

## 2021-10-19 DIAGNOSIS — F32A Depression, unspecified: Secondary | ICD-10-CM | POA: Diagnosis present

## 2021-10-19 DIAGNOSIS — N136 Pyonephrosis: Principal | ICD-10-CM | POA: Diagnosis present

## 2021-10-19 DIAGNOSIS — Z79899 Other long term (current) drug therapy: Secondary | ICD-10-CM

## 2021-10-19 DIAGNOSIS — K5900 Constipation, unspecified: Secondary | ICD-10-CM | POA: Diagnosis present

## 2021-10-19 DIAGNOSIS — Z803 Family history of malignant neoplasm of breast: Secondary | ICD-10-CM

## 2021-10-19 DIAGNOSIS — Z20822 Contact with and (suspected) exposure to covid-19: Secondary | ICD-10-CM | POA: Diagnosis present

## 2021-10-19 HISTORY — DX: Polycystic ovarian syndrome: E28.2

## 2021-10-19 LAB — TYPE AND SCREEN
ABO/RH(D): O POS
Antibody Screen: NEGATIVE

## 2021-10-19 LAB — HCG, SERUM, QUALITATIVE: Preg, Serum: NEGATIVE

## 2021-10-19 LAB — CBC
HCT: 42.9 % (ref 36.0–46.0)
Hemoglobin: 13 g/dL (ref 12.0–15.0)
MCH: 24.3 pg — ABNORMAL LOW (ref 26.0–34.0)
MCHC: 30.3 g/dL (ref 30.0–36.0)
MCV: 80.2 fL (ref 80.0–100.0)
Platelets: 238 10*3/uL (ref 150–400)
RBC: 5.35 MIL/uL — ABNORMAL HIGH (ref 3.87–5.11)
RDW: 18.6 % — ABNORMAL HIGH (ref 11.5–15.5)
WBC: 7.1 10*3/uL (ref 4.0–10.5)
nRBC: 0 % (ref 0.0–0.2)

## 2021-10-19 LAB — PROTIME-INR
INR: 1 (ref 0.8–1.2)
Prothrombin Time: 12.9 seconds (ref 11.4–15.2)

## 2021-10-19 LAB — MRSA NEXT GEN BY PCR, NASAL: MRSA by PCR Next Gen: NOT DETECTED

## 2021-10-19 MED ORDER — SODIUM CHLORIDE 0.9 % IV SOLN: INTRAVENOUS | Status: DC

## 2021-10-19 MED ORDER — OXYCODONE HCL 5 MG PO TABS
5.0000 mg | ORAL_TABLET | ORAL | Status: DC | PRN
  Administered 2021-10-20 – 2021-10-21 (×3): 5 mg via ORAL
  Filled 2021-10-19 (×3): qty 1

## 2021-10-19 MED ORDER — HYDROMORPHONE HCL 1 MG/ML IJ SOLN: 0.5000 mg | INTRAMUSCULAR | Status: DC | PRN

## 2021-10-19 MED ORDER — DIPHENHYDRAMINE HCL 25 MG PO CAPS
50.0000 mg | ORAL_CAPSULE | Freq: Once | ORAL | Status: AC
Start: 2021-10-20 — End: 2021-10-20
  Administered 2021-10-20: 50 mg via ORAL
  Filled 2021-10-19: qty 2

## 2021-10-19 MED ORDER — DIPHENHYDRAMINE HCL 50 MG/ML IJ SOLN: 12.5000 mg | Freq: Four times a day (QID) | INTRAMUSCULAR | Status: DC | PRN

## 2021-10-19 MED ORDER — GABAPENTIN 400 MG PO CAPS
800.0000 mg | ORAL_CAPSULE | Freq: Three times a day (TID) | ORAL | Status: DC
  Administered 2021-10-19 – 2021-10-25 (×18): 800 mg via ORAL
  Filled 2021-10-19 (×18): qty 2

## 2021-10-19 MED ORDER — BACLOFEN 20 MG PO TABS
20.0000 mg | ORAL_TABLET | Freq: Four times a day (QID) | ORAL | Status: DC
  Administered 2021-10-19 – 2021-10-25 (×21): 20 mg via ORAL
  Filled 2021-10-19 (×21): qty 1

## 2021-10-19 MED ORDER — ORAL CARE MOUTH RINSE: 15.0000 mL | OROMUCOSAL | Status: DC | PRN

## 2021-10-19 MED ORDER — PREDNISONE 50 MG PO TABS
50.0000 mg | ORAL_TABLET | Freq: Once | ORAL | Status: AC
Start: 2021-10-20 — End: 2021-10-20
  Administered 2021-10-20: 50 mg via ORAL
  Filled 2021-10-19: qty 1

## 2021-10-19 MED ORDER — SERTRALINE HCL 100 MG PO TABS
100.0000 mg | ORAL_TABLET | Freq: Two times a day (BID) | ORAL | Status: DC
  Administered 2021-10-19 – 2021-10-25 (×12): 100 mg via ORAL
  Filled 2021-10-19 (×12): qty 1

## 2021-10-19 MED ORDER — DIPHENHYDRAMINE HCL 25 MG PO CAPS: 50.0000 mg | ORAL_CAPSULE | Freq: Once | ORAL | Status: DC

## 2021-10-19 MED ORDER — ONDANSETRON HCL 4 MG/2ML IJ SOLN
4.0000 mg | INTRAMUSCULAR | Status: DC | PRN
Start: 2021-10-19 — End: 2021-10-25
  Administered 2021-10-19 – 2021-10-25 (×2): 4 mg via INTRAVENOUS
  Filled 2021-10-19 (×2): qty 2

## 2021-10-19 MED ORDER — DIPHENHYDRAMINE HCL 12.5 MG/5ML PO ELIX: 12.5000 mg | ORAL_SOLUTION | Freq: Four times a day (QID) | ORAL | Status: DC | PRN

## 2021-10-19 MED ORDER — OLANZAPINE 5 MG PO TABS
2.5000 mg | ORAL_TABLET | Freq: Every day | ORAL | Status: DC
  Administered 2021-10-19 – 2021-10-25 (×7): 2.5 mg via ORAL
  Filled 2021-10-19 (×7): qty 1

## 2021-10-19 MED ORDER — DIAZEPAM 5 MG PO TABS
5.0000 mg | ORAL_TABLET | Freq: Every day | ORAL | Status: DC
  Administered 2021-10-20 – 2021-10-25 (×6): 5 mg via ORAL
  Filled 2021-10-19: qty 3
  Filled 2021-10-19 (×5): qty 1

## 2021-10-19 MED ORDER — LAMOTRIGINE 25 MG PO TABS
50.0000 mg | ORAL_TABLET | Freq: Every day | ORAL | Status: DC
  Administered 2021-10-19 – 2021-10-25 (×7): 50 mg via ORAL
  Filled 2021-10-19 (×7): qty 2

## 2021-10-19 MED ORDER — PANTOPRAZOLE SODIUM 40 MG PO TBEC
40.0000 mg | DELAYED_RELEASE_TABLET | Freq: Every day | ORAL | Status: DC
  Administered 2021-10-20 – 2021-10-25 (×6): 40 mg via ORAL
  Filled 2021-10-19 (×6): qty 1

## 2021-10-19 MED ORDER — PREDNISONE 50 MG PO TABS: 50.0000 mg | ORAL_TABLET | ORAL | Status: DC

## 2021-10-19 MED ORDER — CEFTRIAXONE SODIUM 2 G IJ SOLR: 2.0000 g | INTRAMUSCULAR | Status: DC

## 2021-10-19 MED ORDER — DIAZEPAM 5 MG PO TABS
10.0000 mg | ORAL_TABLET | Freq: Every day | ORAL | Status: DC
  Administered 2021-10-19 – 2021-10-24 (×6): 10 mg via ORAL
  Filled 2021-10-19 (×6): qty 2

## 2021-10-19 MED ORDER — ACETAMINOPHEN 325 MG PO TABS
650.0000 mg | ORAL_TABLET | ORAL | Status: DC | PRN
  Administered 2021-10-20 – 2021-10-24 (×7): 650 mg via ORAL
  Filled 2021-10-19 (×7): qty 2

## 2021-10-19 MED ORDER — SODIUM CHLORIDE 0.9 % IV SOLN
2.0000 g | Freq: Three times a day (TID) | INTRAVENOUS | Status: DC
  Administered 2021-10-19 – 2021-10-25 (×18): 2 g via INTRAVENOUS
  Filled 2021-10-19 (×21): qty 12.5

## 2021-10-19 MED ORDER — SODIUM CHLORIDE 0.9 % IV SOLN
2.0000 g | Freq: Once | INTRAVENOUS | Status: DC
  Filled 2021-10-19: qty 12.5

## 2021-10-19 MED ORDER — PREDNISONE 50 MG PO TABS
50.0000 mg | ORAL_TABLET | Freq: Once | ORAL | Status: AC
Start: 2021-10-19 — End: 2021-10-19
  Administered 2021-10-19: 50 mg via ORAL
  Filled 2021-10-19: qty 1

## 2021-10-19 NOTE — H&P (Signed)
Chief Complaint: Patient was seen in consultation today for kidney stones at the request of Jaffe,Adam R  Referring Physician(s): Jaffe,Adam R  Supervising Physician: Daryll Brod  Patient Status: [place]  History of Present Illness: [name] is a 41 y.o. female with history of MS, paraplegia, and transverse myelitis.  She is wheelchair dependent and is a resident at [facility].  She has an urostomy and was diagnosed with kidney stones in May 2023 that persist.  She is expecting to have stone retrieval by urology tomorrow and IR has been requested to place bilateral PCNs to facilitate this process.  Past Medical History:  Diagnosis Date   Bladder stones    Hx of   Depression    GERD (gastroesophageal reflux disease)    Intertrigo 04/24/2017   Left upper extremity swelling 04/24/2017   Multiple sclerosis (HCC)    Paraplegia (HCC)    Polycystic disease, ovaries    PTSD (post-traumatic stress disorder)    Transverse myelitis ([place])    Yeast infection 09/26/2017    Past Surgical History:  Procedure Laterality Date   CHOLECYSTECTOMY     CYSTECTOMY W/ URETEROILEAL CONDUIT     IR FLUORO GUIDE CV LINE LEFT  02/25/2017   IR US GUIDE VASC ACCESS LEFT  02/25/2017   IRRIGATION AND DEBRIDEMENT ABSCESS N/A 02/18/2017   Procedure: IRRIGATION AND DEBRIDEMENT ABSCESS;  Surgeon: Excell Seltzer, MD;  Location: [facility];  Service: General;  Laterality: N/A;   TONSILLECTOMY     WISDOM TOOTH EXTRACTION      Allergies: Iodine, Latex, Other, Polymyxin b, Shellfish-derived products, Vancomycin, Aspirin, and Strawberry extract  Medications: Prior to Admission medications   Medication Sig Start Date End Date Taking? Authorizing Provider  acetaminophen (TYLENOL) 325 MG tablet Take 650 mg by mouth every 4 (four) hours as needed for headache, mild pain or fever.   Yes [provider]  acetaminophen-codeine (TYLENOL #3) 300-30 MG tablet Take by mouth every 8  (eight) hours as needed for severe pain. DO NOT EXCEED 4000 MG OF TYLENOL IN A DAY   Yes [provider]  baclofen (LIORESAL) 20 MG tablet Take 1 tablet (20 mg total) by mouth 4 (four) times daily. 02/13/19  Yes Jaffe, Adam R, DO  diazepam (VALIUM) 5 MG tablet Take 1-2 tablets (5-10 mg total) by mouth 2 (two) times daily. Take 1 tablet (5 mg) BID and Take 2 tablets (10 mg) at bedtime Patient taking differently: Take 5-10 mg by mouth See admin instructions. Take 5 mg by mouth in the morning and 10 mg at bedtime 03/12/18  Yes Aline August, MD  ferrous sulfate 325 (65 FE) MG tablet Take 1 tablet (325 mg total) by mouth daily with breakfast. 08/17/21 10/17/21 Yes Elodia Florence., MD  fluticasone [facility]) 50 MCG/ACT nasal spray Place 1 spray into the nose daily.   Yes [provider]  gabapentin (NEURONTIN) 400 MG capsule Take 800 mg by mouth 3 (three) times daily.   Yes [provider]  lamoTRIgine (LAMICTAL) 25 MG tablet Take 2 tablets (50 mg total) by mouth daily. 04/03/17  Yes Bonnielee Haff, MD  loratadine (CLARITIN) 10 MG tablet Take 10 mg by mouth daily.   Yes [provider]  menthol-cetylpyridinium (CEPACOL) 3 MG lozenge Take 1 lozenge by mouth every 4 (four) hours as needed for sore throat.   Yes [provider]  Multiple Vitamins-Minerals (MULTIVITAMIN WITH MINERALS) tablet Take 1 tablet by mouth daily.   Yes [provider]  nystatin cream (MYCOSTATIN) Apply 1 application. topically daily as needed (for genitourinary dieseas/disorder for antifungal at bedtime). Apply to vaginal area   Yes [provider]  OLANZapine (ZYPREXA) 2.5 MG tablet Take 2.5 mg by mouth daily.   Yes [provider]  omeprazole (PRILOSEC) 20 MG capsule Take 20 mg by mouth daily.   Yes [provider]  ondansetron (ZOFRAN) 4 MG tablet Take 4 mg by mouth every 8 (eight) hours as needed for nausea or vomiting.   Yes [provider]  Probiotic Product (PROBIOTIC PO) Take 1 capsule by mouth daily.   Yes [provider]  sertraline (ZOLOFT) 100 MG tablet Take 1 tablet (100 mg total) by mouth 2 (two) times daily. 04/03/17  Yes Bonnielee Haff, MD  urea (CARMOL) 20 % cream Apply 1 application  topically daily. Apply to both feet.   Yes [provider]     Family History  Problem Relation Age of Onset   Diabetes type II Father    CAD Father    Dementia Father    Stroke Father    Seizures Father    Diabetes type II Mother    Hypertension Mother    Breast cancer Other    Diabetes type II Other     Social History   Socioeconomic History   Marital status: Single    Spouse name: Not on file   Number of children: 0   Years of education: Not on file   Highest education level: Not on file  Occupational History   Occupation: disabled  Tobacco Use   Smoking status: Never   Smokeless tobacco: Never  Vaping Use   Vaping Use: Never used  Substance and Sexual Activity   Alcohol use: No   Drug use: No   Sexual activity: Not Currently    Birth control/protection: Abstinence  Other Topics Concern   Not on file  Social History Narrative    All disabled.  Attended [employer] until she got transverse myelitis. Right handed is in nursing home [facility] Strain: Low Risk  (01/02/2018)   Overall Financial Resource Strain (CARDIA)    Difficulty of Paying Living Expenses: Not hard at all  Food Insecurity: No Food Insecurity (01/02/2018)   Hunger Vital Sign    Worried About Running Out of Food in the Last Year: Never true    Ran Out of Food in the Last Year: Never true  Transportation Needs: No Transportation Needs (01/02/2018)   PRAPARE - Hydrologist (Medical): No    Lack of Transportation (Non-Medical): No  Physical Activity: Inactive (01/02/2018)   Exercise Vital Sign    Days of Exercise per Week: 0 days    Minutes of  Exercise per Session: 0 min  Stress: Stress Concern Present (01/02/2018)   [place]    Feeling of Stress : To some extent  Social Connections: Moderately Integrated (01/02/2018)   Social Connection and Isolation Panel [NHANES]    Frequency of Communication with Friends and Family: More than three times a week    Frequency of Social Gatherings with Friends and Family: Never    Attends Religious Services: More than 4 times per year    Active Member of [employer] or Organizations: Yes    Attends Archivist Meetings: 1 to 4 times per year    Marital Status: Never married  Review of Systems: A 12 point ROS discussed and pertinent positives are indicated in the HPI above.  All other systems are negative.  Review of Systems  Constitutional: Negative.   HENT: Negative.    Eyes: Negative.   Respiratory: Negative.    Cardiovascular: Negative.   Gastrointestinal: Negative.   Endocrine: Negative.   Genitourinary:  Positive for flank pain.  Musculoskeletal:  Positive for back pain.       Endorses feet feeling swollen  Allergic/Immunologic: Negative.   Neurological: Negative.   Hematological: Negative.   Psychiatric/Behavioral: Negative.      Vital Signs: BP 105/67, HR 77, Resp 16, SpO2 99%  Physical Exam Constitutional:      General: She is not in acute distress.    Appearance: She is ill-appearing.  HENT:     Mouth/Throat:     Mouth: Mucous membranes are moist.     Pharynx: Oropharynx is clear.  Eyes:     Extraocular Movements: Extraocular movements intact.     Conjunctiva/sclera: Conjunctivae normal.  Cardiovascular:     Rate and Rhythm: Normal rate and regular rhythm.  Pulmonary:     Effort: Pulmonary effort is normal.     Breath sounds: Normal breath sounds.  Skin:    General: Skin is warm and dry.  Neurological:     General: No focal deficit present.     Mental Status: She is alert and oriented to  person, place, and time.  Psychiatric:        Mood and Affect: Mood normal.        Behavior: Behavior normal.     Imaging: No results found.  Labs:  CBC: Recent Labs    08/14/21 0452 08/15/21 0347 08/16/21 0357 10/19/21 0834  WBC 8.6 6.2 6.1 7.1  HGB 8.6* 8.7* 8.4* 13.0  HCT 29.5* 30.8* 29.7* 42.9  PLT 199 211 204 238    COAGS: Recent Labs    08/13/21 2239 10/19/21 0834  INR 1.1 1.0    BMP: Recent Labs    08/13/21 2239 08/14/21 0452 08/15/21 0347 08/16/21 0357  NA 135  --  142 141  K 3.5  --  3.4* 3.4*  CL 101  --  109 109  CO2 25  --  24 25  GLUCOSE 148*  --  124* 98  BUN 12  --  9 7  CALCIUM 8.9  --  8.6* 8.4*  CREATININE 0.62 0.47 0.46 0.41*  GFRNONAA >60 >60 >60 >60    LIVER FUNCTION TESTS: Recent Labs    08/13/21 2239 08/15/21 0347 08/16/21 0357  BILITOT 0.9 0.5 0.4  AST 26 13* 15  ALT '29 20 17  '$ ALKPHOS 83 57 49  PROT 8.0 6.3* 6.0*  ALBUMIN 4.0 3.0* 2.9*    Assessment and Plan:  Bilateral Nephrolithiasis --Stones persist and she has been dealing with UTIs --Urology plans to remove these stones 10/20/21 --Plan has been for bilateral PCN today.  She has an anaphylactic allergy to iodinated contrast.  No prep was given.  Urology made aware.  Unable to proceed until patient is premedicated.  IR scheduler will coordinate with urology.  Thank you for this interesting consult.  I greatly enjoyed meeting [name] and look forward to participating in their care.  A copy of this report was sent to the requesting provider on this date.  Electronically Signed: Pasty Spillers, PA 10/19/2021, 9:06 AM   I spent a total of 40 Minutes  in face to face in clinical consultation,  greater than 50% of which was counseling/coordinating care for kidney stones.

## 2021-10-19 NOTE — Progress Notes (Signed)
Pt and pt mother stated to RN they would like to stay until Sunday to ensure no complications arise post-surgery. Pt concerned with having another infection.

## 2021-10-19 NOTE — H&P (View-Only) (Signed)
.H&P  Chief Complaint: Bilateral renal stones  History of Present Illness: [name] is a 41 y.o. 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 She was hospitalized in May with sepsis. She had a CT that showed bilateral renal stone without obstruction. The largest was 71m on the right. She has is currently having some pain, right > left, and has been taking tylenol with codeine. the pain has been present since just before the hospitalization. She has had no hematuria. She has a history of stones and has had prior ESWL.   [facility] however, she was not pre-medicated for her contrast dye allergy.   She is currently resting comfortably in SS awaiting room placement.    Past Medical History:  Diagnosis Date   Bladder stones    Hx of   Depression    GERD (gastroesophageal reflux disease)    Intertrigo 04/24/2017   Left upper extremity swelling 04/24/2017   [place]    Paraplegia (HCC)    Polycystic disease, ovaries    PTSD (post-traumatic stress disorder)    [place]    Yeast infection 09/26/2017    Past Surgical History:  Procedure Laterality Date   CHOLECYSTECTOMY     CYSTECTOMY W/ URETEROILEAL CONDUIT     IR FLUORO GUIDE CV LINE LEFT  02/25/2017   IR [place] V[place] ACCESS LEFT  02/25/2017   IRRIGATION AND DEBRIDEMENT ABSCESS N/A 02/18/2017   Procedure: IRRIGATION AND DEBRIDEMENT ABSCESS;  Surgeon: HExcell Seltzer MD;  Location: [facility];  Service: General;  Laterality: N/A;   TONSILLECTOMY     WISDOM TOOTH EXTRACTION      Home Medications:  No current facility-administered medications on file prior to encounter.   Current Outpatient Medications on File Prior to Encounter  Medication Sig Dispense Refill   acetaminophen (TYLENOL) 325  MG tablet Take 650 mg by mouth every 4 (four) hours as needed for headache, mild pain or fever.     acetaminophen-codeine (TYLENOL #3) 300-30 MG tablet Take by mouth every 8 (eight) hours as needed for severe pain. DO NOT EXCEED 4000 MG OF TYLENOL IN A DAY     baclofen (LIORESAL) 20 MG tablet Take 1 tablet (20 mg total) by mouth 4 (four) times daily. 112 tablet 5   diazepam (VALIUM) 5 MG tablet Take 1-2 tablets (5-10 mg total) by mouth 2 (two) times daily. Take 1 tablet (5 mg) BID and Take 2 tablets (10 mg) at bedtime (Patient taking differently: Take 5-10 mg by mouth See admin instructions. Take 5 mg by mouth in the morning and 10 mg at bedtime) 14 tablet 0   ferrous sulfate 325 (65 FE) MG tablet Take 1 tablet (325 mg total) by mouth daily with breakfast. 30 tablet 0   fluticasone (FLONASE) 50 MCG/ACT nasal spray Place 1 spray into the nose daily.     gabapentin (NEURONTIN) 400 MG capsule Take 800 mg by mouth 3 (three) times daily.     lamoTRIgine (LAMICTAL) 25 MG tablet Take 2 tablets (50 mg total) by mouth daily. 60 tablet 0   loratadine (CLARITIN) 10 MG tablet Take 10 mg by mouth daily.     menthol-cetylpyridinium (CEPACOL) 3 MG lozenge Take 1 lozenge by mouth every 4 (four) hours as needed for sore throat.  Multiple Vitamins-Minerals (MULTIVITAMIN WITH MINERALS) tablet Take 1 tablet by mouth daily.     nystatin cream (MYCOSTATIN) Apply 1 application. topically daily as needed (for genitourinary dieseas/disorder for antifungal at bedtime). Apply to vaginal area     OLANZapine (ZYPREXA) 2.5 MG tablet Take 2.5 mg by mouth daily.     omeprazole (PRILOSEC) 20 MG capsule Take 20 mg by mouth daily.     ondansetron (ZOFRAN) 4 MG tablet Take 4 mg by mouth every 8 (eight) hours as needed for nausea or vomiting.     Probiotic Product (PROBIOTIC PO) Take 1 capsule by mouth daily.     sertraline (ZOLOFT) 100 MG tablet Take 1 tablet (100 mg total) by mouth 2 (two) times daily. 60 tablet 0   urea (CARMOL)  20 % cream Apply 1 application  topically daily. Apply to both feet.       Allergies:  Allergies  Allergen Reactions   Iodine Anaphylaxis   Latex Anaphylaxis   Other Other (See Comments)    Documented on Order Summary Report - unknown reaction Apricot, collard greens, green bean, melon, peaches, turnip greens   Polymyxin B Hives    Pt had hives in right arm with infusion of polymyxin on November 26th, 2018   Shellfish-Derived Products Anaphylaxis    All seafood   Vancomycin Anaphylaxis   Aspirin Other (See Comments)    Reaction unknown   Iodinated Contrast Media Hives    Patient requires 13 hour  prep prior to administration   Strawberry Extract Hives and Rash    Family History  Problem Relation Age of Onset   Diabetes type II Father    CAD Father    Dementia Father    Stroke Father    Seizures Father    Diabetes type II Mother    Hypertension Mother    Breast cancer Other    Diabetes type II Other     Social History:  reports that she has never smoked. She has never used smokeless tobacco. She reports that she does not drink alcohol and does not use drugs.  ROS: A complete review of systems was performed.  All systems are negative except for pertinent findings as noted.  Physical Exam:  Vital signs in last 24 hours: Temp:  [97.8 F (36.6 C)] 97.8 F (36.6 C) (07/20 0756) Pulse Rate:  [77] 77 (07/20 0756) Resp:  [16] 16 (07/20 0756) BP: (105)/(67) 105/67 (07/20 0756) SpO2:  [99 %] 99 % (07/20 0756) Constitutional:  Alert and oriented, No acute distress Cardiovascular: Regular rate and rhythm Respiratory: Normal respiratory effort GI: Abdomen is soft, nontender, nondistended, no abdominal masses GU: ostomy draining yellow urine into foley bag  Lymphatic: No lymphadenopathy Neurologic: Grossly intact Psychiatric: Normal mood and affect  Laboratory Data:  Recent Labs    10/19/21 0834  WBC 7.1  HGB 13.0  HCT 42.9  PLT 238     Results for orders  placed or performed during the hospital encounter of 10/19/21 (from the past 24 hour(s))  CBC     Status: Abnormal   Collection Time: 10/19/21  8:34 AM  Result Value Ref Range   WBC 7.1 4.0 - 10.5 K/uL   RBC 5.35 (H) 3.87 - 5.11 MIL/uL   Hemoglobin 13.0 12.0 - 15.0 g/dL   HCT 42.9 36.0 - 46.0 %   MCV 80.2 80.0 - 100.0 fL   MCH 24.3 (L) 26.0 - 34.0 pg   MCHC 30.3 30.0 - 36.0 g/dL   RDW  18.6 (H) 11.5 - 15.5 %   Platelets 238 150 - 400 K/uL   nRBC 0.0 0.0 - 0.2 %  Protime-INR     Status: None   Collection Time: 10/19/21  8:34 AM  Result Value Ref Range   Prothrombin Time 12.9 11.4 - 15.2 seconds   INR 1.0 0.8 - 1.2  hCG, serum, qualitative     Status: None   Collection Time: 10/19/21  8:34 AM  Result Value Ref Range   Preg, Serum NEGATIVE NEGATIVE   No results found for this or any previous visit (from the past 240 hour(s)).  Renal Function: No results for input(s): "CREATININE" in the last 168 hours. CrCl cannot be calculated (Patient's most recent lab result is older than the maximum 21 days allowed.).  Radiologic Imaging: No results found.  Impression/Assessment:  Bilateral renal stones in pt with recent sepsis and ileal conduit  Plan:  Admit for pre-med prep per IR in anticipation of PCN access tomorrow morning.   Bilateral PCNL tomorrow afternoon by Dr. Jeffie Pollock.   Debbrah Alar 10/19/2021, 11:50 AM

## 2021-10-19 NOTE — H&P (Signed)
.H&P  Chief Complaint: Bilateral renal stones  History of Present Illness: [name] is a 41 y.o. 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 She was hospitalized in May with sepsis. She had a CT that showed bilateral renal stone without obstruction. The largest was 42m on the right. She has is currently having some pain, right > left, and has been taking tylenol with codeine. the pain has been present since just before the hospitalization. She has had no hematuria. She has a history of stones and has had prior ESWL.   She was scheduled to go to IR today [facility] Pollock however, she was not pre-medicated for her contrast dye allergy.   She is currently resting comfortably in SS awaiting room placement.    Past Medical History:  Diagnosis Date   Bladder stones    Hx of   Depression    GERD (gastroesophageal reflux disease)    Intertrigo 04/24/2017   Left upper extremity swelling 04/24/2017   Multiple sclerosis ([place]    Paraplegia (HCC)    Polycystic disease, ovaries    PTSD (post-traumatic stress disorder)    Transverse myelitis ([place]    Yeast infection 09/26/2017    Past Surgical History:  Procedure Laterality Date   CHOLECYSTECTOMY     CYSTECTOMY W/ URETEROILEAL CONDUIT     IR FLUORO GUIDE CV LINE LEFT  02/25/2017   IR [place] VASC ACCESS LEFT  02/25/2017   IRRIGATION AND DEBRIDEMENT ABSCESS N/A 02/18/2017   Procedure: IRRIGATION AND DEBRIDEMENT ABSCESS;  Surgeon: HExcell Seltzer MD;  Location: [facility];  Service: General;  Laterality: N/A;   TONSILLECTOMY     WISDOM TOOTH EXTRACTION      Home Medications:  No current facility-administered medications on file prior to encounter.   Current Outpatient Medications on File Prior to Encounter  Medication Sig Dispense Refill   acetaminophen (TYLENOL) 325  MG tablet Take 650 mg by mouth every 4 (four) hours as needed for headache, mild pain or fever.     acetaminophen-codeine (TYLENOL #3) 300-30 MG tablet Take by mouth every 8 (eight) hours as needed for severe pain. DO NOT EXCEED 4000 MG OF TYLENOL IN A DAY     baclofen (LIORESAL) 20 MG tablet Take 1 tablet (20 mg total) by mouth 4 (four) times daily. 112 tablet 5   diazepam (VALIUM) 5 MG tablet Take 1-2 tablets (5-10 mg total) by mouth 2 (two) times daily. Take 1 tablet (5 mg) BID and Take 2 tablets (10 mg) at bedtime (Patient taking differently: Take 5-10 mg by mouth See admin instructions. Take 5 mg by mouth in the morning and 10 mg at bedtime) 14 tablet 0   ferrous sulfate 325 (65 FE) MG tablet Take 1 tablet (325 mg total) by mouth daily with breakfast. 30 tablet 0   fluticasone (FLONASE) 50 MCG/ACT nasal spray Place 1 spray into the nose daily.     gabapentin (NEURONTIN) 400 MG capsule Take 800 mg by mouth 3 (three) times daily.     lamoTRIgine (LAMICTAL) 25 MG tablet Take 2 tablets (50 mg total) by mouth daily. 60 tablet 0   loratadine (CLARITIN) 10 MG tablet Take 10 mg by mouth daily.     menthol-cetylpyridinium (CEPACOL) 3 MG lozenge Take 1 lozenge by mouth every 4 (four) hours as needed for sore throat.  Multiple Vitamins-Minerals (MULTIVITAMIN WITH MINERALS) tablet Take 1 tablet by mouth daily.     nystatin cream (MYCOSTATIN) Apply 1 application. topically daily as needed (for genitourinary dieseas/disorder for antifungal at bedtime). Apply to vaginal area     OLANZapine (ZYPREXA) 2.5 MG tablet Take 2.5 mg by mouth daily.     omeprazole (PRILOSEC) 20 MG capsule Take 20 mg by mouth daily.     ondansetron (ZOFRAN) 4 MG tablet Take 4 mg by mouth every 8 (eight) hours as needed for nausea or vomiting.     Probiotic Product (PROBIOTIC PO) Take 1 capsule by mouth daily.     sertraline (ZOLOFT) 100 MG tablet Take 1 tablet (100 mg total) by mouth 2 (two) times daily. 60 tablet 0   urea (CARMOL)  20 % cream Apply 1 application  topically daily. Apply to both feet.       Allergies:  Allergies  Allergen Reactions   Iodine Anaphylaxis   Latex Anaphylaxis   Other Other (See Comments)    Documented on Order Summary Report - unknown reaction Apricot, collard greens, green bean, melon, peaches, turnip greens   Polymyxin B Hives    Pt had hives in right arm with infusion of polymyxin on November 26th, 2018   Shellfish-Derived Products Anaphylaxis    All seafood   Vancomycin Anaphylaxis   Aspirin Other (See Comments)    Reaction unknown   Iodinated Contrast Media Hives    Patient requires 13 hour  prep prior to administration   Strawberry Extract Hives and Rash    Family History  Problem Relation Age of Onset   Diabetes type II Father    CAD Father    Dementia Father    Stroke Father    Seizures Father    Diabetes type II Mother    Hypertension Mother    Breast cancer Other    Diabetes type II Other     Social History:  reports that she has never smoked. She has never used smokeless tobacco. She reports that she does not drink alcohol and does not use drugs.  ROS: A complete review of systems was performed.  All systems are negative except for pertinent findings as noted.  Physical Exam:  Vital signs in last 24 hours: Temp:  [97.8 F (36.6 C)] 97.8 F (36.6 C) (07/20 0756) Pulse Rate:  [77] 77 (07/20 0756) Resp:  [16] 16 (07/20 0756) BP: (105)/(67) 105/67 (07/20 0756) SpO2:  [99 %] 99 % (07/20 0756) Constitutional:  Alert and oriented, No acute distress Cardiovascular: Regular rate and rhythm Respiratory: Normal respiratory effort GI: Abdomen is soft, nontender, nondistended, no abdominal masses GU: ostomy draining yellow urine into foley bag  Lymphatic: No lymphadenopathy Neurologic: Grossly intact Psychiatric: Normal mood and affect  Laboratory Data:  Recent Labs    10/19/21 0834  WBC 7.1  HGB 13.0  HCT 42.9  PLT 238     Results for orders  placed or performed during the hospital encounter of 10/19/21 (from the past 24 hour(s))  CBC     Status: Abnormal   Collection Time: 10/19/21  8:34 AM  Result Value Ref Range   WBC 7.1 4.0 - 10.5 K/uL   RBC 5.35 (H) 3.87 - 5.11 MIL/uL   Hemoglobin 13.0 12.0 - 15.0 g/dL   HCT 42.9 36.0 - 46.0 %   MCV 80.2 80.0 - 100.0 fL   MCH 24.3 (L) 26.0 - 34.0 pg   MCHC 30.3 30.0 - 36.0 g/dL   RDW  18.6 (H) 11.5 - 15.5 %   Platelets 238 150 - 400 K/uL   nRBC 0.0 0.0 - 0.2 %  Protime-INR     Status: None   Collection Time: 10/19/21  8:34 AM  Result Value Ref Range   Prothrombin Time 12.9 11.4 - 15.2 seconds   INR 1.0 0.8 - 1.2  hCG, serum, qualitative     Status: None   Collection Time: 10/19/21  8:34 AM  Result Value Ref Range   Preg, Serum NEGATIVE NEGATIVE   No results found for this or any previous visit (from the past 240 hour(s)).  Renal Function: No results for input(s): "CREATININE" in the last 168 hours. CrCl cannot be calculated (Patient's most recent lab result is older than the maximum 21 days allowed.).  Radiologic Imaging: No results found.  Impression/Assessment:  Bilateral renal stones in pt with recent sepsis and ileal conduit  Plan:  Admit for pre-med prep per IR in anticipation of PCN access tomorrow morning.   Bilateral PCNL tomorrow afternoon by Dr. Jeffie Pollock.   Debbrah Alar 10/19/2021, 11:50 AM

## 2021-10-19 NOTE — Progress Notes (Signed)
Call to [facility] to speak with Margurite Auerbach nurse per PA request.  Patient with documented allergy to ciodinated contrast. PA - Hailee wanted to know if patient had been on a steroid regimen prior to today's procedure.  Left VM w/ nursing director Bridgette to urgently return call to let us know if patient had been on steroid regimen.

## 2021-10-19 NOTE — Plan of Care (Signed)

## 2021-10-19 NOTE — Progress Notes (Signed)
Pharmacy Antibiotic Note  [name] is a 41 y.o. female admitted on 10/19/2021 for bilateral PCNs by IR prior to PCNLs by Dr. Jeffie Pollock. Patient with contrast allergy, did not receive pre-medication so procedure postponed until pre-medicated appropriately. Pharmacy has been consulted for cefepime dosing.  Plan: Cefepime 2 g IV q8h     Temp (24hrs), Avg:98.1 F (36.7 C), Min:97.8 F (36.6 C), Max:98.3 F (36.8 C)  Recent Labs  Lab 10/19/21 0834  WBC 7.1    CrCl cannot be calculated (Patient's most recent lab result is older than the maximum 21 days allowed.).    Allergies  Allergen Reactions   Iodine Anaphylaxis   Latex Anaphylaxis   Other Other (See Comments)    Documented on Order Summary Report - unknown reaction Apricot, collard greens, green bean, melon, peaches, turnip greens   Polymyxin B Hives    Pt had hives in right arm with infusion of polymyxin on November 26th, 2018   Shellfish-Derived Products Anaphylaxis    All seafood   Vancomycin Anaphylaxis   Aspirin Other (See Comments)    Reaction unknown   Iodinated Contrast Media Hives    Patient requires 13 hour  prep prior to administration   Strawberry Extract Hives and Rash    Antimicrobials this admission: Cefepime 7/20 >>  Microbiology results: NA  Thank you for allowing pharmacy to be a part of this patient's care.   Tawnya Crook, PharmD, BCPS Clinical Pharmacist 10/19/2021 1:23 PM

## 2021-10-19 NOTE — TOC Progression Note (Signed)
Transition of Care [facility]) - Progression Note    Patient Details  Name: [name] MRN: [id number] Date of Birth: [birth year]  [facility]) CM/SW Contact  Beckham Capistran, Juliann Pulse, RN Phone Number: 10/19/2021, 3:56 PM  Clinical Narrative: Will confirm from [place] level of care-await call back from facility           Expected Discharge Plan and Services                                                 Social Determinants of Health (SDOH) Interventions    Readmission Risk Interventions     No data to display

## 2021-10-20 ENCOUNTER — Encounter (HOSPITAL_COMMUNITY)
Admission: RE | Disposition: A | Discharge status: Skilled Nursing Facility | Payer: Self-pay | Source: Ambulatory Visit | Attending: Urology

## 2021-10-20 ENCOUNTER — Observation Stay (HOSPITAL_COMMUNITY): Payer: Medicaid Other

## 2021-10-20 ENCOUNTER — Ambulatory Visit: Payer: Medicaid Other | Admitting: Nurse Practitioner

## 2021-10-20 ENCOUNTER — Encounter (HOSPITAL_COMMUNITY): Payer: Self-pay

## 2021-10-20 ENCOUNTER — Observation Stay (HOSPITAL_COMMUNITY): Payer: Medicaid Other | Admitting: Anesthesiology

## 2021-10-20 ENCOUNTER — Observation Stay (HOSPITAL_BASED_OUTPATIENT_CLINIC_OR_DEPARTMENT_OTHER): Payer: Medicaid Other | Admitting: Anesthesiology

## 2021-10-20 DIAGNOSIS — F418 Other specified anxiety disorders: Secondary | ICD-10-CM | POA: Diagnosis not present

## 2021-10-20 DIAGNOSIS — G709 Myoneural disorder, unspecified: Secondary | ICD-10-CM

## 2021-10-20 DIAGNOSIS — N200 Calculus of kidney: Secondary | ICD-10-CM | POA: Diagnosis not present

## 2021-10-20 HISTORY — PX: IR NEPHROSTOMY EXCHANGE RIGHT: IMG6070

## 2021-10-20 HISTORY — PX: IR NEPHROURETERAL CATH PLACE RIGHT: IMG6066

## 2021-10-20 HISTORY — PX: IR NEPHROSTOMY PLACEMENT LEFT: IMG6063

## 2021-10-20 HISTORY — PX: NEPHROLITHOTOMY: SHX5134

## 2021-10-20 LAB — HEMOGLOBIN AND HEMATOCRIT, BLOOD
HCT: 43.1 % (ref 36.0–46.0)
Hemoglobin: 13.2 g/dL (ref 12.0–15.0)

## 2021-10-20 SURGERY — NEPHROLITHOTOMY PERCUTANEOUS
Anesthesia: General | Site: Flank | Laterality: Right

## 2021-10-20 MED ORDER — DEXAMETHASONE SODIUM PHOSPHATE 10 MG/ML IJ SOLN
INTRAMUSCULAR | Status: DC | PRN
  Administered 2021-10-20: 10 mg via INTRAVENOUS

## 2021-10-20 MED ORDER — FENTANYL CITRATE (PF) 250 MCG/5ML IJ SOLN: INTRAMUSCULAR | Status: DC | PRN

## 2021-10-20 MED ORDER — FENTANYL CITRATE (PF) 250 MCG/5ML IJ SOLN
INTRAMUSCULAR | Status: DC | PRN
  Administered 2021-10-20: 50 ug via INTRAVENOUS

## 2021-10-20 MED ORDER — ONDANSETRON HCL 4 MG/2ML IJ SOLN: 4.0000 mg | Freq: Once | INTRAMUSCULAR | Status: DC | PRN

## 2021-10-20 MED ORDER — PROPOFOL 10 MG/ML IV BOLUS
INTRAVENOUS | Status: AC
  Filled 2021-10-20: qty 20

## 2021-10-20 MED ORDER — STERILE WATER FOR IRRIGATION IR SOLN
Status: DC | PRN
  Administered 2021-10-20: 250 mL

## 2021-10-20 MED ORDER — LIDOCAINE HCL (PF) 2 % IJ SOLN
INTRAMUSCULAR | Status: AC
  Filled 2021-10-20: qty 5

## 2021-10-20 MED ORDER — FENTANYL CITRATE (PF) 100 MCG/2ML IJ SOLN
INTRAMUSCULAR | Status: AC | PRN
  Administered 2021-10-20 (×2): 50 ug via INTRAVENOUS

## 2021-10-20 MED ORDER — LACTATED RINGERS IV SOLN: INTRAVENOUS | Status: DC

## 2021-10-20 MED ORDER — FENTANYL CITRATE (PF) 100 MCG/2ML IJ SOLN
INTRAMUSCULAR | Status: AC
  Filled 2021-10-20: qty 2

## 2021-10-20 MED ORDER — LIDOCAINE HCL 1 % IJ SOLN
INTRAMUSCULAR | Status: AC
  Filled 2021-10-20: qty 20

## 2021-10-20 MED ORDER — CHLORHEXIDINE GLUCONATE 0.12 % MT SOLN
15.0000 mL | Freq: Once | OROMUCOSAL | Status: AC
  Administered 2021-10-20: 15 mL via OROMUCOSAL

## 2021-10-20 MED ORDER — DEXAMETHASONE SODIUM PHOSPHATE 10 MG/ML IJ SOLN
INTRAMUSCULAR | Status: AC
  Filled 2021-10-20: qty 1

## 2021-10-20 MED ORDER — IOHEXOL 300 MG/ML  SOLN
50.0000 mL | Freq: Once | INTRAMUSCULAR | Status: AC | PRN
  Administered 2021-10-20: 10 mL

## 2021-10-20 MED ORDER — LIDOCAINE 2% (20 MG/ML) 5 ML SYRINGE
INTRAMUSCULAR | Status: DC | PRN
  Administered 2021-10-20: 80 mg via INTRAVENOUS

## 2021-10-20 MED ORDER — IOHEXOL 300 MG/ML  SOLN
INTRAMUSCULAR | Status: DC | PRN
  Administered 2021-10-20: 20 mL

## 2021-10-20 MED ORDER — MIDAZOLAM HCL 2 MG/2ML IJ SOLN
INTRAMUSCULAR | Status: AC | PRN
  Administered 2021-10-20 (×2): 1 mg via INTRAVENOUS

## 2021-10-20 MED ORDER — ROCURONIUM BROMIDE 10 MG/ML (PF) SYRINGE
PREFILLED_SYRINGE | INTRAVENOUS | Status: AC
  Filled 2021-10-20: qty 10

## 2021-10-20 MED ORDER — OXYCODONE HCL 5 MG PO TABS: 5.0000 mg | ORAL_TABLET | Freq: Once | ORAL | Status: DC | PRN

## 2021-10-20 MED ORDER — MIDAZOLAM HCL 2 MG/2ML IJ SOLN
INTRAMUSCULAR | Status: AC
  Filled 2021-10-20: qty 2

## 2021-10-20 MED ORDER — SODIUM CHLORIDE 0.9 % IR SOLN
Status: DC | PRN
  Administered 2021-10-20: 12000 mL

## 2021-10-20 MED ORDER — ONDANSETRON HCL 4 MG/2ML IJ SOLN
INTRAMUSCULAR | Status: DC | PRN
  Administered 2021-10-20: 4 mg via INTRAVENOUS

## 2021-10-20 MED ORDER — SODIUM CHLORIDE 0.9% FLUSH
5.0000 mL | Freq: Three times a day (TID) | INTRAVENOUS | Status: DC
  Administered 2021-10-21 – 2021-10-24 (×9): 5 mL

## 2021-10-20 MED ORDER — ONDANSETRON HCL 4 MG/2ML IJ SOLN
INTRAMUSCULAR | Status: AC
  Filled 2021-10-20: qty 2

## 2021-10-20 MED ORDER — FENTANYL CITRATE PF 50 MCG/ML IJ SOSY: 25.0000 ug | PREFILLED_SYRINGE | INTRAMUSCULAR | Status: DC | PRN

## 2021-10-20 MED ORDER — OXYCODONE HCL 5 MG/5ML PO SOLN: 5.0000 mg | Freq: Once | ORAL | Status: DC | PRN

## 2021-10-20 MED ORDER — PROPOFOL 10 MG/ML IV BOLUS
INTRAVENOUS | Status: DC | PRN
  Administered 2021-10-20: 100 mg via INTRAVENOUS
  Administered 2021-10-20: 200 mg via INTRAVENOUS

## 2021-10-20 MED ORDER — MIDAZOLAM HCL 2 MG/2ML IJ SOLN
INTRAMUSCULAR | Status: AC
  Filled 2021-10-20: qty 4

## 2021-10-20 SURGICAL SUPPLY — 57 items
APL PRP STRL LF DISP 70% ISPRP (MISCELLANEOUS) ×1
APL SKNCLS STERI-STRIP NONHPOA (GAUZE/BANDAGES/DRESSINGS) ×1
BAG COUNTER SPONGE SURGICOUNT (BAG) IMPLANT
BAG DRN RND TRDRP ANRFLXCHMBR (UROLOGICAL SUPPLIES) ×1
BAG SPNG CNTER NS LX DISP (BAG)
BAG URINE DRAIN 2000ML AR STRL (UROLOGICAL SUPPLIES) ×2 IMPLANT
BASKET ZERO TIP NITINOL 2.4FR (BASKET) ×1 IMPLANT
BENZOIN TINCTURE PRP APPL 2/3 (GAUZE/BANDAGES/DRESSINGS) ×2 IMPLANT
BLADE SURG 15 STRL LF DISP TIS (BLADE) ×1 IMPLANT
BLADE SURG 15 STRL SS (BLADE) ×2
BSKT STON RTRVL ZERO TP 2.4FR (BASKET) ×1
CATH 2WAY 30CC 24FR (CATHETERS) IMPLANT
CATH FOLEY LATEX FREE 22FR (CATHETERS) ×2
CATH FOLEY LF 22FR (CATHETERS) IMPLANT
CATH ROBINSON RED A/P 20FR (CATHETERS) IMPLANT
CATH URETERAL DUAL LUMEN 10F (MISCELLANEOUS) ×2 IMPLANT
CATH URETL OPEN 5X70 (CATHETERS) IMPLANT
CATH UROLOGY TORQUE 40 (MISCELLANEOUS) ×2 IMPLANT
CATH X-FORCE N30 NEPHROSTOMY (TUBING) ×2 IMPLANT
CHLORAPREP W/TINT 26 (MISCELLANEOUS) ×2 IMPLANT
COVER SURGICAL LIGHT HANDLE (MISCELLANEOUS) IMPLANT
DRAPE C-ARM 42X120 X-RAY (DRAPES) ×2 IMPLANT
DRAPE LINGEMAN PERC (DRAPES) ×2 IMPLANT
DRAPE SURG IRRIG POUCH 19X23 (DRAPES) ×2 IMPLANT
DRSG PAD ABDOMINAL 8X10 ST (GAUZE/BANDAGES/DRESSINGS) ×4 IMPLANT
DRSG TEGADERM 8X12 (GAUZE/BANDAGES/DRESSINGS) ×3 IMPLANT
EXTRACTOR STONE NITINOL NGAGE (UROLOGICAL SUPPLIES) IMPLANT
GAUZE PAD ABD 8X10 STRL (GAUZE/BANDAGES/DRESSINGS) ×2 IMPLANT
GAUZE SPONGE 4X4 12PLY STRL (GAUZE/BANDAGES/DRESSINGS) ×2 IMPLANT
GLOVE SURG SS PI 8.0 STRL IVOR (GLOVE) ×2 IMPLANT
GOWN STRL REUS W/ TWL XL LVL3 (GOWN DISPOSABLE) ×1 IMPLANT
GOWN STRL REUS W/TWL XL LVL3 (GOWN DISPOSABLE) ×2
GUIDEWIRE AMPLAZ .035X145 (WIRE) ×4 IMPLANT
GUIDEWIRE STR DUAL SENSOR (WIRE) ×1 IMPLANT
KIT BASIN OR (CUSTOM PROCEDURE TRAY) ×2 IMPLANT
KIT PROBE 340X3.4XDISP GRN (MISCELLANEOUS) IMPLANT
KIT PROBE TRILOGY 3.4X340 (MISCELLANEOUS) ×2
KIT PROBE TRILOGY 3.9X350 (MISCELLANEOUS) IMPLANT
KIT TURNOVER KIT A (KITS) IMPLANT
LASER FIB FLEXIVA PULSE ID 365 (Laser) IMPLANT
MANIFOLD NEPTUNE II (INSTRUMENTS) ×2 IMPLANT
NS IRRIG 1000ML POUR BTL (IV SOLUTION) ×2 IMPLANT
PACK CYSTO (CUSTOM PROCEDURE TRAY) ×2 IMPLANT
SHEATH PEELAWAY SET 9 (SHEATH) IMPLANT
SPONGE T-LAP 4X18 ~~LOC~~+RFID (SPONGE) ×2 IMPLANT
SUT SILK 2 0 30  PSL (SUTURE) ×2
SUT SILK 2 0 30 PSL (SUTURE) ×1 IMPLANT
SYR 10ML LL (SYRINGE) ×2 IMPLANT
SYR 20ML LL LF (SYRINGE) ×4 IMPLANT
TOWEL OR 17X26 10 PK STRL BLUE (TOWEL DISPOSABLE) ×2 IMPLANT
TOWEL OR NON WOVEN STRL DISP B (DISPOSABLE) ×2 IMPLANT
TRACTIP FLEXIVA PULS ID 200XHI (Laser) IMPLANT
TRACTIP FLEXIVA PULSE ID 200 (Laser)
TRAY FOLEY MTR SLVR 16FR STAT (SET/KITS/TRAYS/PACK) ×1 IMPLANT
TUBING CONNECTING 10 (TUBING) ×4 IMPLANT
TUBING STONE CATCHER TRILOGY (MISCELLANEOUS) IMPLANT
TUBING UROLOGY SET (TUBING) ×2 IMPLANT

## 2021-10-20 NOTE — Procedures (Signed)
  Procedure:  R antegrade nephroureteral catheter placement 58fPreprocedure diagnosis: Diagnoses of Bilateral renal stones and Renal calculi were pertinent to this visit.  Postprocedure diagnosis: same EBL:    minimal Complications:   none immediate  See full dictation in [facility]'s  DDillard CannonMD Main # [phone]  [phone] [phone]

## 2021-10-20 NOTE — Interval H&P Note (Signed)
History and Physical Interval Note:  Access postponed until today to allow time for premed for contrast allergy.  The access has been delayed today because of unexpected duration of prior cases.   With the degree of the delays, the decision was made to treat only the right sided stones today since they have the largest burden and consider rescheduling the left side based on the post op imaging that will be done tomorrow to assess for residual fragments.   I have discussed this with the patient and her mother and they are agreeable with the revised plan.   10/20/2021 2:15 PM  [name]  has presented today for surgery, with the diagnosis of BILATERAL RENAL STONES.  The various methods of treatment have been discussed with the patient and family. After consideration of risks, benefits and other options for treatment, the patient has consented to  Procedure(s): RIGHT NEPHROLITHOTOMY PERCUTANEOUS AND LEFT PERCUTANEOUS NEPHROLITHOTOMY (Bilateral) as a surgical intervention.  The patient's history has been reviewed, patient examined, no change in status, stable for surgery.  I have reviewed the patient's chart and labs.  Questions were answered to the patient's satisfaction.     Irine Seal

## 2021-10-20 NOTE — Brief Op Note (Signed)
10/19/2021 - 10/20/2021  4:43 PM  PATIENT:  [name]  41 y.o. female  PRE-OPERATIVE DIAGNOSIS:  RIGHT RENAL STONES  POST-OPERATIVE DIAGNOSIS:  RIGHT RENAL STONES  PROCEDURE:  Procedure(s): RIGHT NEPHROLITHOTOMY PERCUTANEOUS (Right)  SURGEON:  Surgeon(s) and Role:    Irine Seal, MD - Primary  PHYSICIAN ASSISTANT:   ASSISTANTS: none   ANESTHESIA:   general  EBL:  50 mL   BLOOD ADMINISTERED:none  DRAINS:  47f silastic foley nephrostomy and 641fshort nephroview safety cath.     LOCAL MEDICATIONS USED:  NONE  SPECIMEN:  Source of Specimen:  stone fragments from kidney.   DISPOSITION OF SPECIMEN:   to family to bring to office.   COUNTS:  YES  TOURNIQUET:  * No tourniquets in log *  DICTATION: .Dragon Dictation  PLAN OF CARE: Admit to inpatient   PATIENT DISPOSITION:  PACU - hemodynamically stable.   Delay start of Pharmacological VTE agent (>24hrs) due to surgical blood loss or risk of bleeding: yes

## 2021-10-20 NOTE — Anesthesia Procedure Notes (Signed)
Procedure Name: Intubation Date/Time: 10/20/2021 3:33 PM  Performed by: Myrtie Soman, MDPatient Re-evaluated:Patient Re-evaluated prior to induction Oxygen Delivery Method: Circle system utilized Preoxygenation: Pre-oxygenation with 100% oxygen Induction Type: IV induction Ventilation: Mask ventilation without difficulty Laryngoscope Size: Glidescope and 3 Difficulty Due To: Difficulty was anticipated, Difficult Airway- due to reduced neck mobility, Difficult Airway- due to anterior larynx and Difficult Airway- due to limited oral opening

## 2021-10-20 NOTE — Anesthesia Postprocedure Evaluation (Signed)
Anesthesia Post Note  Patient: [name]  Procedure(s) Performed: RIGHT NEPHROLITHOTOMY PERCUTANEOUS (Right: Flank)     Patient location during evaluation: PACU Anesthesia Type: General Level of consciousness: awake and alert Pain management: pain level controlled Vital Signs Assessment: post-procedure vital signs reviewed and stable Respiratory status: spontaneous breathing, nonlabored ventilation, respiratory function stable and patient connected to nasal cannula oxygen Cardiovascular status: blood pressure returned to baseline and stable Postop Assessment: no apparent nausea or vomiting Anesthetic complications: yes   Encounter Notable Events  Notable Event Outcome Phase Comment  Difficult to intubate - expected  Intraprocedure Filed from anesthesia note documentation.    Last Vitals:  Vitals:   10/20/21 1730 10/20/21 1745  BP: (!) 142/78 136/73  Pulse: 81 80  Resp: 12 12  Temp:  36.8 C  SpO2: 97% 97%    Last Pain:  Vitals:   10/20/21 1745  TempSrc:   PainSc: 0-No pain                 Mishal Probert S
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
## 2021-10-20 NOTE — Anesthesia Preprocedure Evaluation (Addendum)
Anesthesia Evaluation  Patient identified by MRN, date of birth, ID band Patient awake    Reviewed: Allergy & Precautions, NPO status , Patient's Chart, lab work & pertinent test results  Airway Mallampati: III  TM Distance: <3 FB Neck ROM: Limited    Dental no notable dental hx. (+) Dental Advisory Given   Pulmonary neg pulmonary ROS,    Pulmonary exam normal breath sounds clear to auscultation       Cardiovascular negative cardio ROS Normal cardiovascular exam Rhythm:Regular Rate:Normal     Neuro/Psych Anxiety Depression Transverse myelitis MS  paraplegic  Neuromuscular disease    GI/Hepatic Neg liver ROS, GERD  ,  Endo/Other  negative endocrine ROS  Renal/GU negative Renal ROS  negative genitourinary   Musculoskeletal negative musculoskeletal ROS (+)   Abdominal   Peds negative pediatric ROS (+)  Hematology negative hematology ROS (+)   Anesthesia Other Findings   Reproductive/Obstetrics negative OB ROS                            Anesthesia Physical Anesthesia Plan  ASA: 3  Anesthesia Plan: General   Post-op Pain Management: Minimal or no pain anticipated   Induction: Intravenous  PONV Risk Score and Plan: 3 and Ondansetron, Dexamethasone and Treatment may vary due to age or medical condition  Airway Management Planned: Oral ETT and Video Laryngoscope Planned  Additional Equipment:   Intra-op Plan:   Post-operative Plan: Extubation in OR  Informed Consent: I have reviewed the patients History and Physical, chart, labs and discussed the procedure including the risks, benefits and alternatives for the proposed anesthesia with the patient or authorized representative who has indicated his/her understanding and acceptance.     Dental advisory given  Plan Discussed with: CRNA and Surgeon  Anesthesia Plan Comments: (Cis atracurium for relaxation if needed)        Anesthesia Quick Evaluation
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
## 2021-10-20 NOTE — Progress Notes (Signed)
Patient was brought down to short stay from inpatient floor prematurely. Patient has not had the IR procedure yet and is still waiting to have that done. Informed patient we would just keep her in short stay for now and she would leave for IR from short stay.  Patient voiced understanding.  Informed patient we would communicate any other changes to her as they present.

## 2021-10-20 NOTE — Op Note (Signed)
Procedure: 1.  Right percutaneous nephrolithotomy for less than 2 cm stone burden. 2.  Application of fluoroscopy.  Preop diagnosis: Right renal stones.  Postop diagnosis: Same.  Surgeon: Dr. Irine Seal.  Specimen: Stone.  Drains: 1.  6 French nephro view nephrostomy catheter. 2.  22 French Silastic Foley nephrostomy catheter.  EBL: 50 mL.  Complications: None.  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  She was found to have multiple right renal stones and 2 small left renal stones.  The initial plan was to perform bilateral percutaneous nephrostomy but due to marked delays in getting access it was felt that we needed to manage the right-sided stones today and deal with the left-sided stones at another date.  Procedure: She had been on antibiotic coverage since 10/19/2021 and had received a steroid prep for contrast allergy prior to access placement earlier today.  She underwent successful upper pole access and interventional radiology prior to coming to the operating room.  She was taken to the operating room where a general anesthetic was induced on the holding room stretcher.  She was then rolled prone on chest rolls with great care taken to pad all pressure points.  Her ileal conduit drainage tubing was adjusted to ensure drainage through the procedure.  She was fitted with PAS hose.  Her nephrostomy site was then prepped with ChloraPrep and she was draped in usual sterile fashion after 3-minute delay.  A sensor wire was then advanced through the access catheter into the ileal conduit under fluoroscopic guidance.  A dual-lumen 8 French catheter was then advanced over the sensor wire into the ureter and a second Amplatz Super Stiff wire was then placed adjacent to the sensor wire.  The dual-lumen catheter was removed and the skin incision was enlarged to 2 cm with a knife.  The  NephroMax balloon dilation catheter was placed and inflated to 20 atm and the sheath was advanced into the renal pelvis under fluoroscopic guidance.  The rigid nephroscope was then passed and some clot was retrieved.  The trilogy lithotrite was then inserted after the stone was visualized and the stone was fragmented using the ultrasonic and hydraulic lithotripsy settings.  There was one larger stone consistent with the 11 mm stone seen on CT as well as several smaller associated stones.  Once all the stones visible with the rigid scope were removed I switched to the flexible nephroscope and identified additional stones including the second largest stone which was approximately 8 mm in her dilated proximal ureter.  The stone was removed with an engage basket and another smaller stone appeared to have been flushed out of the system.  I advanced the scope to the ureteral ileal anastomosis.  The calyceal system was then thoroughly inspected a final time and no additional stones were identified visibly or with fluoroscopy.  After the scope was removed and a 6 [place] short nephro view catheter was passed over the sensor safety wire into the conduit and the wire was removed.  A 22 French Silastic Foley was then prepared by cutting off the tip over the end of the hemostat to create a council catheter.  This was then placed over the working wire through the sheath.  The catheter was not readily visible on fluoroscopy so a very small amount of contrast was instilled to identify the tip of the catheter so could be pulled back  to an appropriate location in the renal pelvis.  Once this had been done the sheath was backed away sufficiently that to milliliters of fluid could be placed in the catheter balloon.  The sheath was then pulled back above the skin.  There was some initial bleeding but it quickly subsided.  A 2-0 silk suture was then used to the wound around the catheters and then secure the catheters in place.  The  sheath was cut away from the catheter and the wire was removed from the large nephrostomy tube.  The safety catheter was capped and the nephrostomy tube was placed to straight drainage.  The drapes were removed and the wound was cleansed.  A dressing of 4 x 4's ABDs and Hypafix was then placed.  She was then rolled back supine on the recovery room stretcher.  Her anesthetic was reversed and she was moved to recovery room in stable condition.  There were no complications.  I gave the 1 intact stone to her mother to bring to the office for analysis.

## 2021-10-20 NOTE — Transfer of Care (Signed)
Immediate Anesthesia Transfer of Care Note  Patient: [name]  Procedure(s) Performed: RIGHT NEPHROLITHOTOMY PERCUTANEOUS (Right: Flank)  Patient Location: PACU  Anesthesia Type:General  Level of Consciousness: drowsy  Airway & Oxygen Therapy: Patient Spontanous Breathing and Patient connected to face mask oxygen  Post-op Assessment: Report given to RN and Post -op Vital signs reviewed and stable  Post vital signs: Reviewed and stable  Last Vitals:  Vitals Value Taken Time  BP 150/93 10/20/21 1712  Temp    Pulse 83 10/20/21 1714  Resp 11 10/20/21 1714  SpO2 100 % 10/20/21 1714  Vitals shown include unvalidated device data.  Last Pain:  Vitals:   10/20/21 1512  TempSrc:   PainSc: 0-No pain      Patients Stated Pain Goal: 5 (81/44/81 8563)  Complications:  Encounter Notable Events  Notable Event Outcome Phase Comment  Difficult to intubate - expected  Intraprocedure Filed from anesthesia note documentation.

## 2021-10-21 ENCOUNTER — Encounter (HOSPITAL_COMMUNITY): Payer: Self-pay | Admitting: Urology

## 2021-10-21 ENCOUNTER — Observation Stay (HOSPITAL_COMMUNITY): Payer: Medicaid Other

## 2021-10-21 LAB — HEMOGLOBIN AND HEMATOCRIT, BLOOD
HCT: 42.1 % (ref 36.0–46.0)
Hemoglobin: 12.8 g/dL (ref 12.0–15.0)

## 2021-10-21 NOTE — Progress Notes (Signed)
PHARMACY NOTE -  Cefepime  Pharmacy has been assisting with dosing of cefepime for perioperative prophylaxis. Dosage remains stable at 2g IV q8 hr and further renal adjustments per institutional Pharmacy antibiotic protocol  Pharmacy will sign off, following peripherally for culture results or dose adjustments. Please reconsult if a change in clinical status warrants re-evaluation of dosage.  Reuel Boom, PharmD, BCPS [phone] 10/21/2021, 11:03 AM

## 2021-10-21 NOTE — Progress Notes (Signed)
Urology Progress Note   1 day post op from right PCNL.   Subjective: NAEON. Urine clear yellow in foley, light red in nephrostomy tube. CT scan with punctate residual stone, minimal stones on the left. Kumpe was removed at beside and nephrostomy tube capped. Pain well controlled  Objective: Vital signs in last 24 hours: Temp:  [97.9 F (36.6 C)-98.5 F (36.9 C)] 98.3 F (36.8 C) (07/22 0512) Pulse Rate:  [73-87] 73 (07/22 0512) Resp:  [10-20] 14 (07/22 0512) BP: (101-151)/(55-94) 101/55 (07/22 0512) SpO2:  [90 %-100 %] 94 % (07/22 0512) Weight:  [86.5 kg] 86.5 kg (07/21 1100)  Intake/Output from previous day: 07/21 0701 - 07/22 0700 In: 1660 [P.O.:960; I.V.:400; IV Piggyback:300] Out: 1350 [Urine:1300; Blood:50] Intake/Output this shift: Total I/O In: 1060 [P.O.:960; IV Piggyback:100] Out: 1300 [Urine:1300]  Physical Exam:  General: Alert and oriented CV: Regular rate Lungs: No increased work of breathing Abdomen:  Soft, appropriately tender. Incisions c/d/i. JP SS GU: Foley in place draining clear yellow urine. Nephrostomy tube capped  Ext: NT, No erythema  Lab Results: Recent Labs    10/19/21 0834 10/20/21 1816 10/21/21 0548  HGB 13.0 13.2 12.8  HCT 42.9 43.1 42.1   No results for input(s): "NA", "K", "CL", "CO2", "GLUCOSE", "BUN", "CREATININE", "CALCIUM" in the last 72 hours.  Studies/Results: IR NEPHROSTOMY PLACEMENT LEFT  Result Date: 10/20/2021 CLINICAL DATA:  Symptomatic right nephrolithiasis, planned percutaneous nephrolithotomy EXAM: RIGHT PERCUTANEOUS NEPHROURETERAL CATHETER PLACEMENT UNDER FLUOROSCOPIC GUIDANCE FLUOROSCOPY: Radiation Exposure Index (as provided by the fluoroscopic device): 30 mGy air Kerma TECHNIQUE: The procedure, risks (including but not limited to bleeding, infection, organ damage ), benefits, and alternatives were explained to the patient. Questions regarding the procedure were encouraged and answered. The patient understands and  consents to the procedure. Right flank region prepped with chlorhexidine, draped in usual sterile fashion, infiltrated locally with 1% lidocaine. Intravenous Fentanyl 110mg and Versed '3mg'$  were administered as conscious sedation during continuous monitoring of the patient's level of consciousness and physiological / cardiorespiratory status by the radiology RN, with a total moderate sedation time of 30 minutes. Under real-time fluoroscopic guidance, a 21-gauge trocar needle was advanced into a posterior upper pole calyx using the peripheral radiodense calculus as a guide. Contrast injection demonstrated good positioning in the collecting system. A 018 guidewire advanced easily down the ureter. Needle was exchanged over a guidewire for transitional dilator. Catheter was exchanged over a guidewire for a 5 [place] catheter, advanced into the ileal conduit. Radiograph confirms appropriate nephroureteral catheter positioning. Catheter capped and secured externally. The patient tolerated the procedure well. COMPLICATIONS: COMPLICATIONS None. IMPRESSION: 1. Technically successful right antegrade percutaneous nephroureteral catheter placement. Electronically Signed   By: DLucrezia EuropeM.D.   On: 10/20/2021 17:51   IR NEPHROSTOMY EXCHANGE RIGHT  Result Date: 10/20/2021 CLINICAL DATA:  Symptomatic right nephrolithiasis, planned percutaneous nephrolithotomy EXAM: RIGHT PERCUTANEOUS NEPHROURETERAL CATHETER PLACEMENT UNDER FLUOROSCOPIC GUIDANCE FLUOROSCOPY: Radiation Exposure Index (as provided by the fluoroscopic device): 30 mGy air Kerma TECHNIQUE: The procedure, risks (including but not limited to bleeding, infection, organ damage ), benefits, and alternatives were explained to the patient. Questions regarding the procedure were encouraged and answered. The patient understands and consents to the procedure. Right flank region prepped with chlorhexidine, draped in usual sterile fashion, infiltrated locally with 1%  lidocaine. Intravenous Fentanyl 1269m and Versed '3mg'$  were administered as conscious sedation during continuous monitoring of the patient's level of consciousness and physiological / cardiorespiratory status by the radiology RN, with a total  moderate sedation time of 30 minutes. Under real-time fluoroscopic guidance, a 21-gauge trocar needle was advanced into a posterior upper pole calyx using the peripheral radiodense calculus as a guide. Contrast injection demonstrated good positioning in the collecting system. A 018 guidewire advanced easily down the ureter. Needle was exchanged over a guidewire for transitional dilator. Catheter was exchanged over a guidewire for a 5 [place] Kumpe catheter, advanced into the ileal conduit. Radiograph confirms appropriate nephroureteral catheter positioning. Catheter capped and secured externally. The patient tolerated the procedure well. COMPLICATIONS: COMPLICATIONS None. IMPRESSION: 1. Technically successful right antegrade percutaneous nephroureteral catheter placement. Electronically Signed   By: Lucrezia Europe M.D.   On: 10/20/2021 17:51   DG C-Arm 1-60 Min-No Report  Result Date: 10/20/2021 Fluoroscopy was utilized by the requesting physician.  No radiographic interpretation.   DG C-Arm 1-60 Min-No Report  Result Date: 10/20/2021 Fluoroscopy was utilized by the requesting physician.  No radiographic interpretation.    Assessment/Plan:  41 y.o. female s/p right PCNL.  Overall doing well post-op.   - regular diet - capping trial - if does ok will remove tube tomorrow - continue cefepime  Dispo: floor   LOS: 0 days

## 2021-10-22 MED ORDER — SENNA 8.6 MG PO TABS
1.0000 | ORAL_TABLET | Freq: Every day | ORAL | Status: DC | PRN
  Administered 2021-10-22: 8.6 mg via ORAL
  Filled 2021-10-22: qty 1

## 2021-10-22 NOTE — Progress Notes (Signed)
Urology Progress Note   1 day post op from right PCNL.   Subjective: Urine clear yellow in foley, nephrostomy tube clamped with no flank pain. Low grade fever (100.7) overnight but otherwise stable  Objective: Vital signs in last 24 hours: Temp:  [98.2 F (36.8 C)-100.7 F (38.2 C)] 100.7 F (38.2 C) (07/23 0541) Pulse Rate:  [89-98] 98 (07/23 0541) Resp:  [16-18] 16 (07/23 0541) BP: (114-121)/(52-65) 119/65 (07/23 0541) SpO2:  [93 %-97 %] 93 % (07/23 0541)  Intake/Output from previous day: 07/22 0701 - 07/23 0700 In: 1005.2 [P.O.:600; I.V.:5; IV Piggyback:400.2] Out: 800 [Urine:800] Intake/Output this shift: No intake/output data recorded.  Physical Exam:  General: Alert and oriented CV: Regular rate Lungs: No increased work of breathing Abdomen:  Soft, appropriately tender. Incisions c/d/i. JP SS GU: Foley in place draining clear yellow urine. Nephrostomy tube capped  Ext: NT, No erythema  Lab Results: Recent Labs    10/20/21 1816 10/21/21 0548  HGB 13.2 12.8  HCT 43.1 42.1    No results for input(s): "NA", "K", "CL", "CO2", "GLUCOSE", "BUN", "CREATININE", "CALCIUM" in the last 72 hours.  Studies/Results: CT RENAL STONE STUDY  Result Date: 10/21/2021 CLINICAL DATA:  Nephrolithiasis. Status post right percutaneous nephrolithotomy EXAM: CT ABDOMEN AND PELVIS WITHOUT CONTRAST TECHNIQUE: Multidetector CT imaging of the abdomen and pelvis was performed following the standard protocol without IV contrast. RADIATION DOSE REDUCTION: This exam was performed according to the departmental dose-optimization program which includes automated exposure control, adjustment of the mA and/or kV according to patient size and/or use of iterative reconstruction technique. COMPARISON:  08/14/2021 FINDINGS: Lower chest: Subsegmental atelectasis noted in the lung bases. Hepatobiliary: There is diffuse hepatic steatosis. No focal liver abnormality. Cholecystectomy. No bile duct dilatation.  Pancreas: Unremarkable. No pancreatic ductal dilatation or surrounding inflammatory changes. Spleen: Spleen is enlarged measuring 14 cm in cranial caudal dimension. No focal splenic lesion Adrenals/Urinary Tract: Normal adrenal glands. There is a single stone within the upper pole collecting system of the left kidney measuring 2-3 mm, image 72/5. On the previous exam there was a stone within the left upper pole, lower pole and central renal pelvis. No signs of left-sided hydronephrosis. No left ureteral calculi identified. Right-sided large caliber percutaneous nephrostomy tube is in place with tip terminating in the inferior pole collecting system. There is also a right-sided percutaneous nephroureteral stent in place. There is a single stone identified within the upper pole collecting system of the left kidney measuring 4 mm, image 48/6 and image 41/2. On the previous exam there are multiple stones within the upper and lower pole collecting system. The largest on the previous exam was in the upper pole measuring 1 cm. Moderate right hydronephrosis and hydroureter identified. There is a small amount of gas within the right renal pelvis likely reflecting intervention. The patient is status post urinary diversion with right abdominal loop ileostomy. The tip of the right ureteral stent terminates just proximal to the ostomy site a, image 78/2. There are no stones identified along the course of the right ureter to the loop ileostomy. Decompressed urinary bladder is identified within the ventral pelvis. Stomach/Bowel: Stomach appears normal. No bowel wall thickening, inflammation, or distension. There is a moderate stool burden identified within the colon. Vascular/Lymphatic: No significant vascular findings are present. No enlarged abdominal or pelvic lymph nodes. Reproductive: Uterus and bilateral adnexa are unremarkable. Other: No significant free fluid or fluid collections identified. No signs of pneumoperitoneum.  Musculoskeletal: No acute osseous findings. Prominent muscular atrophy  identified within the visualized portions of the lower extremities. IMPRESSION: 1. Status post right-sided percutaneous nephrostomy tube and right-sided percutaneous nephroureteral stent placement. 2. Moderate right hydronephrosis and hydroureter. No stones identified along the course of the right ureter to the loop ileostomy. 3. Bilateral nephrolithiasis. Compared with the previous exam there is a single stone remaining within the upper pole collecting system of the left kidney measuring 2-3 mm. A single stone within the upper pole collecting system of the right kidney remains measuring 4 mm. 4. Hepatic steatosis. 5. Splenomegaly. Electronically Signed   By: Kerby Moors M.D.   On: 10/21/2021 08:08   IR NEPHROSTOMY PLACEMENT LEFT  Result Date: 10/20/2021 CLINICAL DATA:  Symptomatic right nephrolithiasis, planned percutaneous nephrolithotomy EXAM: RIGHT PERCUTANEOUS NEPHROURETERAL CATHETER PLACEMENT UNDER FLUOROSCOPIC GUIDANCE FLUOROSCOPY: Radiation Exposure Index (as provided by the fluoroscopic device): 30 mGy air Kerma TECHNIQUE: The procedure, risks (including but not limited to bleeding, infection, organ damage ), benefits, and alternatives were explained to the patient. Questions regarding the procedure were encouraged and answered. The patient understands and consents to the procedure. Right flank region prepped with chlorhexidine, draped in usual sterile fashion, infiltrated locally with 1% lidocaine. Intravenous Fentanyl 155mg and Versed '3mg'$  were administered as conscious sedation during continuous monitoring of the patient's level of consciousness and physiological / cardiorespiratory status by the radiology RN, with a total moderate sedation time of 30 minutes. Under real-time fluoroscopic guidance, a 21-gauge trocar needle was advanced into a posterior upper pole calyx using the peripheral radiodense calculus as a guide.  Contrast injection demonstrated good positioning in the collecting system. A 018 guidewire advanced easily down the ureter. Needle was exchanged over a guidewire for transitional dilator. Catheter was exchanged over a guidewire for a 5 [place] catheter, advanced into the ileal conduit. Radiograph confirms appropriate nephroureteral catheter positioning. Catheter capped and secured externally. The patient tolerated the procedure well. COMPLICATIONS: COMPLICATIONS None. IMPRESSION: 1. Technically successful right antegrade percutaneous nephroureteral catheter placement. Electronically Signed   By: DLucrezia EuropeM.D.   On: 10/20/2021 17:51   IR NEPHROSTOMY EXCHANGE RIGHT  Result Date: 10/20/2021 CLINICAL DATA:  Symptomatic right nephrolithiasis, planned percutaneous nephrolithotomy EXAM: RIGHT PERCUTANEOUS NEPHROURETERAL CATHETER PLACEMENT UNDER FLUOROSCOPIC GUIDANCE FLUOROSCOPY: Radiation Exposure Index (as provided by the fluoroscopic device): 30 mGy air Kerma TECHNIQUE: The procedure, risks (including but not limited to bleeding, infection, organ damage ), benefits, and alternatives were explained to the patient. Questions regarding the procedure were encouraged and answered. The patient understands and consents to the procedure. Right flank region prepped with chlorhexidine, draped in usual sterile fashion, infiltrated locally with 1% lidocaine. Intravenous Fentanyl 1225m and Versed '3mg'$  were administered as conscious sedation during continuous monitoring of the patient's level of consciousness and physiological / cardiorespiratory status by the radiology RN, with a total moderate sedation time of 30 minutes. Under real-time fluoroscopic guidance, a 21-gauge trocar needle was advanced into a posterior upper pole calyx using the peripheral radiodense calculus as a guide. Contrast injection demonstrated good positioning in the collecting system. A 018 guidewire advanced easily down the ureter. Needle was  exchanged over a guidewire for transitional dilator. Catheter was exchanged over a guidewire for a 5 [place] catheter, advanced into the ileal conduit. Radiograph confirms appropriate nephroureteral catheter positioning. Catheter capped and secured externally. The patient tolerated the procedure well. COMPLICATIONS: COMPLICATIONS None. IMPRESSION: 1. Technically successful right antegrade percutaneous nephroureteral catheter placement. Electronically Signed   By: D Lucrezia Europe.D.   On: 10/20/2021 17:51  DG C-Arm 1-60 Min-No Report  Result Date: 10/20/2021 Fluoroscopy was utilized by the requesting physician.  No radiographic interpretation.   DG C-Arm 1-60 Min-No Report  Result Date: 10/20/2021 Fluoroscopy was utilized by the requesting physician.  No radiographic interpretation.    Assessment/Plan:  41 y.o. female s/p right PCNL.  Overall doing well post-op.   - regular diet - keep nephrostomy tube for now, will continue to observe overnight given low grade fever - continue cefepime  Dispo: floor

## 2021-10-22 NOTE — Plan of Care (Signed)

## 2021-10-23 ENCOUNTER — Inpatient Hospital Stay (HOSPITAL_COMMUNITY): Payer: Medicaid Other

## 2021-10-23 ENCOUNTER — Other Ambulatory Visit: Payer: Self-pay

## 2021-10-23 ENCOUNTER — Observation Stay (HOSPITAL_COMMUNITY): Payer: Medicaid Other

## 2021-10-23 DIAGNOSIS — Z79899 Other long term (current) drug therapy: Secondary | ICD-10-CM | POA: Diagnosis not present

## 2021-10-23 DIAGNOSIS — E876 Hypokalemia: Secondary | ICD-10-CM | POA: Diagnosis present

## 2021-10-23 DIAGNOSIS — N319 Neuromuscular dysfunction of bladder, unspecified: Secondary | ICD-10-CM | POA: Diagnosis present

## 2021-10-23 DIAGNOSIS — Z20822 Contact with and (suspected) exposure to covid-19: Secondary | ICD-10-CM | POA: Diagnosis present

## 2021-10-23 DIAGNOSIS — Z833 Family history of diabetes mellitus: Secondary | ICD-10-CM | POA: Diagnosis not present

## 2021-10-23 DIAGNOSIS — N200 Calculus of kidney: Secondary | ICD-10-CM | POA: Insufficient documentation

## 2021-10-23 DIAGNOSIS — D72829 Elevated white blood cell count, unspecified: Secondary | ICD-10-CM | POA: Diagnosis present

## 2021-10-23 DIAGNOSIS — F32A Depression, unspecified: Secondary | ICD-10-CM | POA: Diagnosis present

## 2021-10-23 DIAGNOSIS — G373 Acute transverse myelitis in demyelinating disease of central nervous system: Secondary | ICD-10-CM | POA: Diagnosis present

## 2021-10-23 DIAGNOSIS — Z8249 Family history of ischemic heart disease and other diseases of the circulatory system: Secondary | ICD-10-CM | POA: Diagnosis not present

## 2021-10-23 DIAGNOSIS — K219 Gastro-esophageal reflux disease without esophagitis: Secondary | ICD-10-CM | POA: Diagnosis present

## 2021-10-23 DIAGNOSIS — D649 Anemia, unspecified: Secondary | ICD-10-CM | POA: Diagnosis present

## 2021-10-23 DIAGNOSIS — Z803 Family history of malignant neoplasm of breast: Secondary | ICD-10-CM | POA: Diagnosis not present

## 2021-10-23 DIAGNOSIS — G801 Spastic diplegic cerebral palsy: Secondary | ICD-10-CM | POA: Diagnosis present

## 2021-10-23 DIAGNOSIS — K5900 Constipation, unspecified: Secondary | ICD-10-CM | POA: Diagnosis present

## 2021-10-23 DIAGNOSIS — R7989 Other specified abnormal findings of blood chemistry: Secondary | ICD-10-CM

## 2021-10-23 DIAGNOSIS — Z823 Family history of stroke: Secondary | ICD-10-CM | POA: Diagnosis not present

## 2021-10-23 DIAGNOSIS — N136 Pyonephrosis: Secondary | ICD-10-CM | POA: Diagnosis present

## 2021-10-23 DIAGNOSIS — R5082 Postprocedural fever: Secondary | ICD-10-CM

## 2021-10-23 DIAGNOSIS — Z91041 Radiographic dye allergy status: Secondary | ICD-10-CM | POA: Diagnosis not present

## 2021-10-23 DIAGNOSIS — R509 Fever, unspecified: Secondary | ICD-10-CM | POA: Diagnosis not present

## 2021-10-23 DIAGNOSIS — F4310 Post-traumatic stress disorder, unspecified: Secondary | ICD-10-CM

## 2021-10-23 DIAGNOSIS — Z993 Dependence on wheelchair: Secondary | ICD-10-CM | POA: Diagnosis not present

## 2021-10-23 LAB — COMPREHENSIVE METABOLIC PANEL
ALT: 27 U/L (ref 0–44)
AST: 19 U/L (ref 15–41)
Albumin: 3.6 g/dL (ref 3.5–5.0)
Alkaline Phosphatase: 70 U/L (ref 38–126)
Anion gap: 8 (ref 5–15)
BUN: 13 mg/dL (ref 6–20)
CO2: 24 mmol/L (ref 22–32)
Calcium: 8.8 mg/dL — ABNORMAL LOW (ref 8.9–10.3)
Chloride: 107 mmol/L (ref 98–111)
Creatinine, Ser: 0.52 mg/dL (ref 0.44–1.00)
GFR, Estimated: >60 mL/min (ref >60)
Glucose, Bld: 149 mg/dL — ABNORMAL HIGH (ref 70–99)
Potassium: 3.6 mmol/L (ref 3.5–5.1)
Sodium: 139 mmol/L (ref 135–145)
Total Bilirubin: 0.6 mg/dL (ref 0.3–1.2)
Total Protein: 7.2 g/dL (ref 6.5–8.1)

## 2021-10-23 LAB — CBC WITH DIFFERENTIAL/PLATELET
Abs Immature Granulocytes: 0.05 10*3/uL (ref 0.00–0.07)
Basophils Absolute: 0 10*3/uL (ref 0.0–0.1)
Basophils Relative: 0 %
Eosinophils Absolute: 0.1 10*3/uL (ref 0.0–0.5)
Eosinophils Relative: 1 %
HCT: 40.3 % (ref 36.0–46.0)
Hemoglobin: 12.7 g/dL (ref 12.0–15.0)
Immature Granulocytes: 0 %
Lymphocytes Relative: 12 %
Lymphs Abs: 1.7 10*3/uL (ref 0.7–4.0)
MCH: 24.8 pg — ABNORMAL LOW (ref 26.0–34.0)
MCHC: 31.5 g/dL (ref 30.0–36.0)
MCV: 78.7 fL — ABNORMAL LOW (ref 80.0–100.0)
Monocytes Absolute: 0.8 10*3/uL (ref 0.1–1.0)
Monocytes Relative: 6 %
Neutro Abs: 11 10*3/uL — ABNORMAL HIGH (ref 1.7–7.7)
Neutrophils Relative %: 81 %
Platelets: 249 10*3/uL (ref 150–400)
RBC: 5.12 MIL/uL — ABNORMAL HIGH (ref 3.87–5.11)
RDW: 18.3 % — ABNORMAL HIGH (ref 11.5–15.5)
WBC: 13.7 10*3/uL — ABNORMAL HIGH (ref 4.0–10.5)
nRBC: 0 % (ref 0.0–0.2)

## 2021-10-23 LAB — D-DIMER, QUANTITATIVE: D-Dimer, Quant: 0.61 ug/mL-FEU — ABNORMAL HIGH (ref 0.00–0.50)

## 2021-10-23 MED ORDER — BISACODYL 10 MG RE SUPP
10.0000 mg | Freq: Every day | RECTAL | Status: DC | PRN
Start: 2021-10-23 — End: 2021-10-25
  Administered 2021-10-23: 10 mg via RECTAL
  Filled 2021-10-23: qty 1

## 2021-10-23 NOTE — Progress Notes (Addendum)
Pt's nephrostomy dressing, two bed pads, and bottom sheet was found saturated. RN received in report that the dressing was changed at 3pm on day shift. RN with NT assistance changed bed linen and nephrostomy dressing.  On-call IR notified and given orders to attach nephrostomy to a drainage bag. RN will continue to monitor.

## 2021-10-23 NOTE — Progress Notes (Signed)
4 Days Post-Op  Subjective: Nyima has a temp this AM to 101.3 on Cefepime but she is feeling well but has been constipated and reports an occasional cough.  The post op CT just minimal residual stone burden on the treated side and only one residual 27m stone on the left.  The previously noted stone in the left renal pelvis has passed.  ROS:  Review of Systems  Respiratory:  Positive for cough.   Gastrointestinal:  Positive for constipation.  All other systems reviewed and are negative.   Anti-infectives: Anti-infectives (From admission, onward)    Start     Dose/Rate Route Frequency Ordered Stop   10/19/21 1415  ceFEPIme (MAXIPIME) 2 g in sodium chloride 0.9 % 100 mL IVPB        2 g 200 mL/hr over 30 Minutes Intravenous Every 8 hours 10/19/21 1319         Current Facility-Administered Medications  Medication Dose Route Frequency Provider Last Rate Last Admin   acetaminophen (TYLENOL) tablet 650 mg  650 mg Oral Q4H PRN DDebbrah Alar PA-C   650 mg at 10/23/21 05625  baclofen (LIORESAL) tablet 20 mg  20 mg Oral QID Dancy, Amanda, PA-C   20 mg at 10/22/21 2147   bisacodyl (DULCOLAX) suppository 10 mg  10 mg Rectal Daily PRN WIrine Seal MD       ceFEPIme (MAXIPIME) 2 g in sodium chloride 0.9 % 100 mL IVPB  2 g Intravenous Q8H [place]ington, Abby K, RPH 200 mL/hr at 10/23/21 0550 2 g at 10/23/21 0550   diazepam (VALIUM) tablet 10 mg  10 mg Oral QHS WIrine Seal MD   10 mg at 10/22/21 2146   diazepam (VALIUM) tablet 5 mg  5 mg Oral Daily DDebbrah Alar PA-C   5 mg at 10/22/21 1103   diphenhydrAMINE (BENADRYL) injection 12.5-25 mg  12.5-25 mg Intravenous Q6H PRN DDebbrah Alar PA-C       Or   diphenhydrAMINE (BENADRYL) 12.5 MG/5ML elixir 12.5-25 mg  12.5-25 mg Oral Q6H PRN DDebbrah Alar PA-C       gabapentin (NEURONTIN) capsule 800 mg  800 mg Oral TID DDebbrah Alar PA-C   800 mg at 10/22/21 2146   HYDROmorphone (DILAUDID) injection 0.5-1 mg  0.5-1 mg Intravenous Q2H PRN DDebbrah Alar  PA-C       lamoTRIgine (LAMICTAL) tablet 50 mg  50 mg Oral Daily DDebbrah Alar PA-C   50 mg at 10/22/21 1103   OLANZapine (ZYPREXA) tablet 2.5 mg  2.5 mg Oral Daily DDebbrah Alar PA-C   2.5 mg at 10/22/21 1104   ondansetron (ZOFRAN) injection 4 mg  4 mg Intravenous Q4H PRN DDebbrah Alar PA-C   4 mg at 10/19/21 1318   Oral care mouth rinse  15 mL Mouth Rinse PRN WIrine Seal MD       oxyCODONE (Oxy IR/ROXICODONE) immediate release tablet 5 mg  5 mg Oral Q4H PRN DDebbrah Alar PA-C   5 mg at 10/21/21 1330   pantoprazole (PROTONIX) EC tablet 40 mg  40 mg Oral Daily DDebbrah Alar PA-C   40 mg at 10/22/21 1104   senna (SENOKOT) tablet 8.6 mg  1 tablet Oral Daily PRN NFlorentina Addison MD   8.6 mg at 10/22/21 2305   sertraline (ZOLOFT) tablet 100 mg  100 mg Oral BID DDebbrah Alar PA-C   100 mg at 10/22/21 2147   sodium chloride flush (NS) 0.9 % injection 5 mL  5 mL Intracatheter Q8H HArne Cleveland MD  5 mL at 10/23/21 0616     Objective: Vital signs in last 24 hours: Temp:  [98.4 F (36.9 C)-101.3 F (38.5 C)] 101.3 F (38.5 C) (07/24 0555) Pulse Rate:  [90-105] 105 (07/24 0555) Resp:  [18-20] 18 (07/24 0555) BP: (123-134)/(66-76) 123/73 (07/24 0555) SpO2:  [93 %-98 %] 98 % (07/24 0555)  Intake/Output from previous day: 07/23 0701 - 07/24 0700 In: 350 [P.O.:240; I.V.:5; IV Piggyback:100] Out: 925 [Urine:925] Intake/Output this shift: No intake/output data recorded.   Physical Exam Vitals reviewed.  Constitutional:      Appearance: Normal appearance.  Cardiovascular:     Rate and Rhythm: Regular rhythm. Tachycardia present.     Heart sounds: Normal heart sounds.  Pulmonary:     Effort: Pulmonary effort is normal.     Breath sounds: Normal breath sounds.  Abdominal:     Palpations: Abdomen is soft.     Tenderness: There is no abdominal tenderness.     Comments: Right nephrostomy tube is draining clear urine.  Per patient, there is still a little leakage but reports that the  nurse checked the site this AM and there was no erythema and the sacral decubitus site was clean.   Musculoskeletal:        General: No swelling.  Neurological:     Mental Status: She is alert and oriented to person, place, and time.     Lab Results:  Recent Labs    10/20/21 1816 10/21/21 0548  HGB 13.2 12.8  HCT 43.1 42.1   BMET No results for input(s): "NA", "K", "CL", "CO2", "GLUCOSE", "BUN", "CREATININE", "CALCIUM" in the last 72 hours. PT/INR No results for input(s): "LABPROT", "INR" in the last 72 hours. ABG No results for input(s): "PHART", "HCO3" in the last 72 hours.  Invalid input(s): "PCO2", "PO2"  Studies/Results: No results found. CT RENAL STONE STUDY  Result Date: 10/21/2021 CLINICAL DATA:  Nephrolithiasis. Status post right percutaneous nephrolithotomy EXAM: CT ABDOMEN AND PELVIS WITHOUT CONTRAST TECHNIQUE: Multidetector CT imaging of the abdomen and pelvis was performed following the standard protocol without IV contrast. RADIATION DOSE REDUCTION: This exam was performed according to the departmental dose-optimization program which includes automated exposure control, adjustment of the mA and/or kV according to patient size and/or use of iterative reconstruction technique. COMPARISON:  08/14/2021 FINDINGS: Lower chest: Subsegmental atelectasis noted in the lung bases. Hepatobiliary: There is diffuse hepatic steatosis. No focal liver abnormality. Cholecystectomy. No bile duct dilatation. Pancreas: Unremarkable. No pancreatic ductal dilatation or surrounding inflammatory changes. Spleen: Spleen is enlarged measuring 14 cm in cranial caudal dimension. No focal splenic lesion Adrenals/Urinary Tract: Normal adrenal glands. There is a single stone within the upper pole collecting system of the left kidney measuring 2-3 mm, image 72/5. On the previous exam there was a stone within the left upper pole, lower pole and central renal pelvis. No signs of left-sided hydronephrosis. No  left ureteral calculi identified. Right-sided large caliber percutaneous nephrostomy tube is in place with tip terminating in the inferior pole collecting system. There is also a right-sided percutaneous nephroureteral stent in place. There is a single stone identified within the upper pole collecting system of the left kidney measuring 4 mm, image 48/6 and image 41/2. On the previous exam there are multiple stones within the upper and lower pole collecting system. The largest on the previous exam was in the upper pole measuring 1 cm. Moderate right hydronephrosis and hydroureter identified. There is a small amount of gas within the right renal pelvis likely  reflecting intervention. The patient is status post urinary diversion with right abdominal loop ileostomy. The tip of the right ureteral stent terminates just proximal to the ostomy site a, image 78/2. There are no stones identified along the course of the right ureter to the loop ileostomy. Decompressed urinary bladder is identified within the ventral pelvis. Stomach/Bowel: Stomach appears normal. No bowel wall thickening, inflammation, or distension. There is a moderate stool burden identified within the colon. Vascular/Lymphatic: No significant vascular findings are present. No enlarged abdominal or pelvic lymph nodes. Reproductive: Uterus and bilateral adnexa are unremarkable. Other: No significant free fluid or fluid collections identified. No signs of pneumoperitoneum. Musculoskeletal: No acute osseous findings. Prominent muscular atrophy identified within the visualized portions of the lower extremities. IMPRESSION: 1. Status post right-sided percutaneous nephrostomy tube and right-sided percutaneous nephroureteral stent placement. 2. Moderate right hydronephrosis and hydroureter. No stones identified along the course of the right ureter to the loop ileostomy. 3. Bilateral nephrolithiasis. Compared with the previous exam there is a single stone remaining  within the upper pole collecting system of the left kidney measuring 2-3 mm. A single stone within the upper pole collecting system of the right kidney remains measuring 4 mm. 4. Hepatic steatosis. 5. Splenomegaly. Electronically Signed   By: Kerby Moors M.D.   On: 10/21/2021 08:08   IR NEPHROSTOMY PLACEMENT LEFT  Result Date: 10/20/2021 CLINICAL DATA:  Symptomatic right nephrolithiasis, planned percutaneous nephrolithotomy EXAM: RIGHT PERCUTANEOUS NEPHROURETERAL CATHETER PLACEMENT UNDER FLUOROSCOPIC GUIDANCE FLUOROSCOPY: Radiation Exposure Index (as provided by the fluoroscopic device): 30 mGy air Kerma TECHNIQUE: The procedure, risks (including but not limited to bleeding, infection, organ damage ), benefits, and alternatives were explained to the patient. Questions regarding the procedure were encouraged and answered. The patient understands and consents to the procedure. Right flank region prepped with chlorhexidine, draped in usual sterile fashion, infiltrated locally with 1% lidocaine. Intravenous Fentanyl 131mg and Versed '3mg'$  were administered as conscious sedation during continuous monitoring of the patient's level of consciousness and physiological / cardiorespiratory status by the radiology RN, with a total moderate sedation time of 30 minutes. Under real-time fluoroscopic guidance, a 21-gauge trocar needle was advanced into a posterior upper pole calyx using the peripheral radiodense calculus as a guide. Contrast injection demonstrated good positioning in the collecting system. A 018 guidewire advanced easily down the ureter. Needle was exchanged over a guidewire for transitional dilator. Catheter was exchanged over a guidewire for a 5 [place] catheter, advanced into the ileal conduit. Radiograph confirms appropriate nephroureteral catheter positioning. Catheter capped and secured externally. The patient tolerated the procedure well. COMPLICATIONS: COMPLICATIONS None. IMPRESSION: 1.  Technically successful right antegrade percutaneous nephroureteral catheter placement. Electronically Signed   By: DLucrezia EuropeM.D.   On: 10/20/2021 17:51   IR NEPHROSTOMY EXCHANGE RIGHT  Result Date: 10/20/2021 CLINICAL DATA:  Symptomatic right nephrolithiasis, planned percutaneous nephrolithotomy EXAM: RIGHT PERCUTANEOUS NEPHROURETERAL CATHETER PLACEMENT UNDER FLUOROSCOPIC GUIDANCE FLUOROSCOPY: Radiation Exposure Index (as provided by the fluoroscopic device): 30 mGy air Kerma TECHNIQUE: The procedure, risks (including but not limited to bleeding, infection, organ damage ), benefits, and alternatives were explained to the patient. Questions regarding the procedure were encouraged and answered. The patient understands and consents to the procedure. Right flank region prepped with chlorhexidine, draped in usual sterile fashion, infiltrated locally with 1% lidocaine. Intravenous Fentanyl 1233m and Versed '3mg'$  were administered as conscious sedation during continuous monitoring of the patient's level of consciousness and physiological / cardiorespiratory status by the radiology RN, with a total  moderate sedation time of 30 minutes. Under real-time fluoroscopic guidance, a 21-gauge trocar needle was advanced into a posterior upper pole calyx using the peripheral radiodense calculus as a guide. Contrast injection demonstrated good positioning in the collecting system. A 018 guidewire advanced easily down the ureter. Needle was exchanged over a guidewire for transitional dilator. Catheter was exchanged over a guidewire for a 5 [place] Kumpe catheter, advanced into the ileal conduit. Radiograph confirms appropriate nephroureteral catheter positioning. Catheter capped and secured externally. The patient tolerated the procedure well. COMPLICATIONS: COMPLICATIONS None. IMPRESSION: 1. Technically successful right antegrade percutaneous nephroureteral catheter placement. Electronically Signed   By: Lucrezia Europe M.D.   On:  10/20/2021 17:51   DG C-Arm 1-60 Min-No Report  Result Date: 10/20/2021 Fluoroscopy was utilized by the requesting physician.  No radiographic interpretation.   DG C-Arm 1-60 Min-No Report  Result Date: 10/20/2021 Fluoroscopy was utilized by the requesting physician.  No radiographic interpretation.     Assessment and Plan: Bilateral renal stones now s/p right PCNL with minimal residual stone and passage of a left renal pelvic stone.   I was planning to D/c the tube today and send her home but she has a fever.  Postop fever.  I will get labs and a CXR and have the hospitalists consult.    Constipation.  Dulcolax suppository today and prn.       LOS: 0 days    Irine Seal 10/23/2021 [phone] Patient ID: Reginia Naas, female   DOB: [birth year], 41 y.o.   MRN: [id number]

## 2021-10-23 NOTE — Consult Note (Signed)
Initial Consultation Note   Patient: [name] SWN:[id number] DOB: [birth year] PCP: Patient, No Pcp Per DOA: 10/19/2021 DOS: the patient was seen and examined on 10/23/2021 Primary service: Irine Seal, MD  Referring physician: Dr. Jeffie Pollock Reason for consult: Post-Op fever  Assessment and Plan: Post-op fever     - admitted to urology service     - Bld CX, Ucx pending     - CXR is negative     - she does have a leukocytosis; she has been started on cefepime by primary team; ok to continue for now     - encourage IS use     - she's had SCDs ordered; check d-dimer, if elevated, doppler legs   Transverse myelitis w/ diplegia Depression PTSD GERD     - continue home regimen  Remainder per primary team.    [facility] will continue to follow the patient.  HPI: [name] is a 41 y.o. female with past medical history of history of transverse myelitis w/ spastic diplegia, neurogenic bladder, depression, renal stones, PTSD, GERD. Presenting urology service for infected renal stones. She had a right antegrade nephroureteral catheter placement and right nephrolithotomy/nephrostomy on 7/21. She tolerated the procedures well. Over 24 hours, her temperature curve has gone up. She has had temps 100.7 and 101.3. She reports some minor cough, but denies dyspnea. She denies N/V/D; in fact she reports constipation. She denies any other symptoms. [facility] was consulted for concern for post-op fever. A CXR, Ucx/bld Cx, and CBC have been obtained. The patient was started on cefepime by the primary team.    Review of Systems: As mentioned in the history of present illness. All other systems reviewed and are negative. Past Medical History:  Diagnosis Date   Bladder stones    Hx of   Depression    GERD (gastroesophageal reflux disease)    Intertrigo 04/24/2017   Left upper extremity swelling 04/24/2017   Multiple sclerosis ([place])    Paraplegia ([facility])    Polycystic disease, ovaries    PTSD  (post-traumatic stress disorder)    Transverse myelitis ([place])    Yeast infection 09/26/2017   Past Surgical History:  Procedure Laterality Date   CHOLECYSTECTOMY     CYSTECTOMY W/ URETEROILEAL CONDUIT     IR FLUORO GUIDE CV LINE LEFT  02/25/2017   IR NEPHROSTOMY EXCHANGE RIGHT  10/20/2021   IR NEPHROSTOMY PLACEMENT LEFT  10/20/2021   IR US GUIDE VASC ACCESS LEFT  02/25/2017   IRRIGATION AND DEBRIDEMENT ABSCESS N/A 02/18/2017   Procedure: IRRIGATION AND DEBRIDEMENT ABSCESS;  Surgeon: Excell Seltzer, MD;  Location: [facility];  Service: General;  Laterality: N/A;   NEPHROLITHOTOMY Right 10/20/2021   Procedure: RIGHT NEPHROLITHOTOMY PERCUTANEOUS;  Surgeon: Irine Seal, MD;  Location: [facility];  Service: Urology;  Laterality: Right;   TONSILLECTOMY     WISDOM TOOTH EXTRACTION     Social History:  reports that she has never smoked. She has never used smokeless tobacco. She reports that she does not drink alcohol and does not use drugs.  Allergies  Allergen Reactions   Iodine Anaphylaxis   Latex Anaphylaxis   Other Other (See Comments)    Documented on Order Summary Report - unknown reaction Apricot, collard greens, green bean, melon, peaches, turnip greens   Polymyxin B Hives    Pt had hives in right arm with infusion of polymyxin on November 26th, 2018   Shellfish-Derived Products Anaphylaxis    All seafood   Vancomycin Anaphylaxis   Iodinated  Contrast Media Hives    Patient requires 13 hour  prep prior to administration   Strawberry Extract Hives and Rash    Family History  Problem Relation Age of Onset   Diabetes type II Father    CAD Father    Dementia Father    Stroke Father    Seizures Father    Diabetes type II Mother    Hypertension Mother    Breast cancer Other    Diabetes type II Other     Prior to Admission medications   Medication Sig Start Date End Date Taking? Authorizing Provider  acetaminophen (TYLENOL) 325 MG tablet Take 650 mg by mouth every 4 (four) hours  as needed for headache, mild pain or fever.   Yes [provider]  acetaminophen-codeine (TYLENOL #3) 300-30 MG tablet Take by mouth every 6 (six) hours as needed for moderate pain or severe pain. DO NOT EXCEED 4000 MG OF TYLENOL IN A DAY   Yes [provider]  baclofen (LIORESAL) 20 MG tablet Take 1 tablet (20 mg total) by mouth 4 (four) times daily. 02/13/19  Yes Jaffe, Adam R, DO  diazepam (VALIUM) 5 MG tablet Take 1-2 tablets (5-10 mg total) by mouth 2 (two) times daily. Take 1 tablet (5 mg) BID and Take 2 tablets (10 mg) at bedtime Patient taking differently: Take 5-10 mg by mouth See admin instructions. Take 5 mg by mouth in the morning and 10 mg at bedtime 03/12/18  Yes Aline August, MD  ferrous sulfate 325 (65 FE) MG tablet Take 1 tablet (325 mg total) by mouth daily with breakfast. 08/17/21 10/20/21 Yes Elodia Florence., MD  fluticasone [facility]) 50 MCG/ACT nasal spray Place 1 spray into the nose daily.   Yes [provider]  gabapentin (NEURONTIN) 400 MG capsule Take 800 mg by mouth 3 (three) times daily.   Yes [provider]  LACTOBACILLUS PO Take 1 capsule by mouth daily.   Yes [provider]  lamoTRIgine (LAMICTAL) 25 MG tablet Take 2 tablets (50 mg total) by mouth daily. 04/03/17  Yes Bonnielee Haff, MD  loratadine (CLARITIN) 10 MG tablet Take 10 mg by mouth daily.   Yes [provider]  menthol-cetylpyridinium (CEPACOL) 3 MG lozenge Take 1 lozenge by mouth every 4 (four) hours as needed for sore throat.   Yes [provider]  Multiple Vitamins-Minerals (MULTIVITAMIN WITH MINERALS) tablet Take 1 tablet by mouth daily.   Yes [provider]  nystatin cream (MYCOSTATIN) Apply 1 application  topically at bedtime as needed (vaginal). Apply to vaginal area   Yes [provider]  OLANZapine (ZYPREXA) 2.5 MG tablet Take 2.5 mg by mouth daily.   Yes [provider]  omeprazole (PRILOSEC) 20 MG  capsule Take 20 mg by mouth daily.   Yes [provider]  ondansetron (ZOFRAN) 4 MG tablet Take 4 mg by mouth every 8 (eight) hours as needed for nausea or vomiting.   Yes [provider]  sertraline (ZOLOFT) 100 MG tablet Take 1 tablet (100 mg total) by mouth 2 (two) times daily. 04/03/17  Yes Bonnielee Haff, MD  urea (CARMOL) 20 % cream Apply 1 application  topically daily. Apply to both feet.   Yes [provider]  Probiotic Product (PROBIOTIC PO) Take 1 capsule by mouth daily. Patient not taking: Reported on 10/20/2021    [provider]    Physical Exam: Vitals:   10/22/21 1737 10/22/21 1835 10/22/21 2101 10/23/21 0555  BP:  134/76 123/73  Pulse:   90 (!) 105  Resp:   18 18  Temp: 98.4 F (36.9 C) 98.7 F (37.1 C) 98.6 F (37 C) (!) 101.3 F (38.5 C)  TempSrc: Oral Oral  Oral  SpO2:   95% 98%  Weight:      Height:       General: 41 y.o. female resting in bed in NAD Eyes: PERRL, normal sclera ENMT: Nares patent w/o discharge, orophaynx clear, dentition poor, ears w/o discharge/lesions/ulcers Neck: Supple, trachea midline Cardiovascular: tachy, +S1, S2, no m/g/r, equal pulses throughout Respiratory: CTABL, no w/r/r, normal WOB GI: BS+, NDNT, no masses noted, no organomegaly noted MSK: No c/c; chronic pedal edema Neuro: A&O x 3, paraplegia Psyc: Appropriate interaction and affect, calm/cooperative  Data Reviewed:   Lab Results  Component Value Date   WBC 13.7 (H) 10/23/2021   HGB 12.7 10/23/2021   HCT 40.3 10/23/2021   MCV 78.7 (L) 10/23/2021   PLT 249 10/23/2021   CXR: No evidence of acute cardiopulmonary disease.   Family Communication: w/ mother at bedside Primary team communication: w/ Dr. Jeffie Pollock Thank you very much for involving [place] in the care of your patient.  Author: Jonnie Finner, DO 10/23/2021 7:38 AM  For on call review [email or web address].

## 2021-10-23 NOTE — Progress Notes (Signed)
Bilateral lower extremity venous duplex has been completed. Preliminary results can be found in CV Proc through chart review.   10/23/21 2:32 PM [name] RVT

## 2021-10-23 NOTE — Progress Notes (Signed)
   10/23/21 0555  Assess: MEWS Score  Temp (!) 101.3 F (38.5 C)  BP 123/73  MAP (mmHg) 86  Pulse Rate (!) 105  Resp 18  SpO2 98 %  O2 Device Room Air  Assess: MEWS Score  MEWS Temp 1  MEWS Systolic 0  MEWS Pulse 1  MEWS RR 0  MEWS LOC 0  MEWS Score 2  MEWS Score Color Yellow  Assess: if the MEWS score is Yellow or Red  Were vital signs taken at a resting state? Yes  Focused Assessment No change from prior assessment  Does the patient meet 2 or more of the SIRS criteria? Yes  Does the patient have a confirmed or suspected source of infection? Yes  MEWS guidelines implemented *See Row Information* Yes  Treat  MEWS Interventions Administered prn meds/treatments;Escalated (See documentation below)  Assess: SIRS CRITERIA  SIRS Temperature  1  SIRS Pulse 1  SIRS Respirations  0  SIRS WBC 1  SIRS Score Sum  3   Pt was mildly sweating and c/o mild headache. PRN Tylenol administered and will continue to monitor. CN notified.

## 2021-10-24 ENCOUNTER — Encounter (HOSPITAL_COMMUNITY): Payer: Self-pay | Admitting: Radiology

## 2021-10-24 DIAGNOSIS — R5082 Postprocedural fever: Secondary | ICD-10-CM | POA: Diagnosis not present

## 2021-10-24 LAB — COMPREHENSIVE METABOLIC PANEL
ALT: 26 U/L (ref 0–44)
AST: 16 U/L (ref 15–41)
Albumin: 3.4 g/dL — ABNORMAL LOW (ref 3.5–5.0)
Alkaline Phosphatase: 72 U/L (ref 38–126)
Anion gap: 8 (ref 5–15)
BUN: 10 mg/dL (ref 6–20)
CO2: 25 mmol/L (ref 22–32)
Calcium: 9.1 mg/dL (ref 8.9–10.3)
Chloride: 107 mmol/L (ref 98–111)
Creatinine, Ser: 0.33 mg/dL — ABNORMAL LOW (ref 0.44–1.00)
GFR, Estimated: >60 mL/min (ref >60)
Glucose, Bld: 145 mg/dL — ABNORMAL HIGH (ref 70–99)
Potassium: 3.4 mmol/L — ABNORMAL LOW (ref 3.5–5.1)
Sodium: 140 mmol/L (ref 135–145)
Total Bilirubin: 0.6 mg/dL (ref 0.3–1.2)
Total Protein: 7.2 g/dL (ref 6.5–8.1)

## 2021-10-24 LAB — CBC WITH DIFFERENTIAL/PLATELET
Abs Immature Granulocytes: 0.05 10*3/uL (ref 0.00–0.07)
Basophils Absolute: 0 10*3/uL (ref 0.0–0.1)
Basophils Relative: 0 %
Eosinophils Absolute: 0.3 10*3/uL (ref 0.0–0.5)
Eosinophils Relative: 2 %
HCT: 39.9 % (ref 36.0–46.0)
Hemoglobin: 12.3 g/dL (ref 12.0–15.0)
Immature Granulocytes: 0 %
Lymphocytes Relative: 13 %
Lymphs Abs: 1.7 10*3/uL (ref 0.7–4.0)
MCH: 24.4 pg — ABNORMAL LOW (ref 26.0–34.0)
MCHC: 30.8 g/dL (ref 30.0–36.0)
MCV: 79.2 fL — ABNORMAL LOW (ref 80.0–100.0)
Monocytes Absolute: 0.9 10*3/uL (ref 0.1–1.0)
Monocytes Relative: 7 %
Neutro Abs: 10.3 10*3/uL — ABNORMAL HIGH (ref 1.7–7.7)
Neutrophils Relative %: 78 %
Platelets: 240 10*3/uL (ref 150–400)
RBC: 5.04 MIL/uL (ref 3.87–5.11)
RDW: 18.1 % — ABNORMAL HIGH (ref 11.5–15.5)
WBC: 13.2 10*3/uL — ABNORMAL HIGH (ref 4.0–10.5)
nRBC: 0 % (ref 0.0–0.2)

## 2021-10-24 LAB — PROCALCITONIN: Procalcitonin: 0.1 ng/mL

## 2021-10-24 LAB — URINE CULTURE: Culture: NO GROWTH

## 2021-10-24 LAB — RESP PANEL BY RT-PCR (FLU A&B, COVID) ARPGX2
Influenza A by PCR: NEGATIVE
Influenza B by PCR: NEGATIVE
SARS Coronavirus 2 by RT PCR: NEGATIVE

## 2021-10-24 MED ORDER — POTASSIUM CHLORIDE CRYS ER 20 MEQ PO TBCR
40.0000 meq | EXTENDED_RELEASE_TABLET | Freq: Once | ORAL | Status: AC
  Administered 2021-10-24: 40 meq via ORAL
  Filled 2021-10-24: qty 2

## 2021-10-24 NOTE — Progress Notes (Signed)
[facility] PROGRESS NOTE   [name] BDZ:[id number] DOB: Aug 27, 1980 DOA: 10/19/2021  PCP: Patient, No Pcp Per  Brief History/Interval Summary: 41 y.o. female with past medical history of history of transverse myelitis w/ spastic diplegia, neurogenic bladder, depression, renal stones, PTSD, GERD. Presenting urology service for infected renal stones. She had a right antegrade nephroureteral catheter placement and right nephrolithotomy/nephrostomy on 7/21. She tolerated the procedures well.  Subsequently developed fever.  Hospitalist was consulted for assistance.      Subjective/Interval History: Patient feels well this morning.  Does mention a dry cough.  No shortness of breath or chest pain.  Denies any nausea or vomiting currently.     Assessment/Plan:  Fever Etiology unclear but presumably due to her GU issues.  Evaluation so far including CBC which shows mild leukocytosis.  Urine culture is pending.  Blood cultures are pending.  D-dimer was mildly elevated.  Lower extremity Doppler studies negative for DVT.  No hypoxia tachycardia or dyspnea.  No reason to proceed with CT angiogram at this time. She does have a cough however her chest x-ray was negative.  We will proceed with flu and COVID-19 PCR. Patient noted to be on cefepime.  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  Procalcitonin is only 0.1.  Will not add any additional antibiotics at this time.  Continue to cefepime for now. Above plan discussed with Dr. Jeffie Pollock.  Nephrolithiasis Status post nephrolithotomy and nephrostomy tube placement.  Management per urology.  History of transverse myelitis with spastic diplegia This is chronic for her.  She apparently resides in a nursing facility.  Noted to be on baclofen gabapentin, Valium.  History of depression/PTSD Stable.  Noted to be on Lamictal, Zyprexa, Zoloft.  Hospitalist will continue to follow this patient while she is hospitalized.  Please  reach out if there are any questions.    Medications: Scheduled:  baclofen  20 mg Oral QID   diazepam  10 mg Oral QHS   diazepam  5 mg Oral Daily   gabapentin  800 mg Oral TID   lamoTRIgine  50 mg Oral Daily   OLANZapine  2.5 mg Oral Daily   pantoprazole  40 mg Oral Daily   sertraline  100 mg Oral BID   sodium chloride flush  5 mL Intracatheter Q8H   Continuous:  ceFEPime (MAXIPIME) IV 2 g (10/24/21 0551)   TMH:[id number], bisacodyl, diphenhydrAMINE **OR** diphenhydrAMINE, HYDROmorphone (DILAUDID) injection, ondansetron, mouth rinse, oxyCODONE, senna  Antibiotics: Anti-infectives (From admission, onward)    Start     Dose/Rate Route Frequency Ordered Stop   10/19/21 1415  ceFEPIme (MAXIPIME) 2 g in sodium chloride 0.9 % 100 mL IVPB        2 g 200 mL/hr over 30 Minutes Intravenous Every 8 hours 10/19/21 1319         Objective:  Vital Signs  Vitals:   10/23/21 1700 10/23/21 2010 10/23/21 2223 10/24/21 0421  BP: 138/88 132/69  118/72  Pulse: 99 99  88  Resp: '20 17  15  '$ Temp: 99.3 F (37.4 C) 99.1 F (37.3 C) 98.5 F (36.9 C) 98.5 F (36.9 C)  TempSrc: Oral Oral Axillary Oral  SpO2: 99% 98%  98%  Weight:      Height:        Intake/Output Summary (Last 24 hours) at 10/24/2021 1112 Last data filed at 10/24/2021 0847 Gross per 24 hour  Intake 130 ml  Output 550 ml  Net -420 ml  Filed Weights   10/20/21 1100  Weight: 86.5 kg    General appearance: Awake alert.  In no distress Resp: Clear to auscultation bilaterally.  Normal effort Cardio: S1-S2 is normal regular.  No S3-S4.  No rubs murmurs or bruit GI: Abdomen is soft.  Nontender nondistended.  Bowel sounds are present normal.  No masses organomegaly    Lab Results:  Data Reviewed: I have personally reviewed following labs and reports of the imaging studies  CBC: Recent Labs  Lab 10/19/21 0834 10/20/21 1816 10/21/21 0548 10/23/21 0806 10/24/21 0436  WBC 7.1  --   --  13.7* 13.2*  NEUTROABS   --   --   --  11.0* 10.3*  HGB 13.0 13.2 12.8 12.7 12.3  HCT 42.9 43.1 42.1 40.3 39.9  MCV 80.2  --   --  78.7* 79.2*  PLT 238  --   --  249 443    Basic Metabolic Panel: Recent Labs  Lab 10/23/21 0744 10/24/21 0436  NA 139 140  K 3.6 3.4*  CL 107 107  CO2 24 25  GLUCOSE 149* 145*  BUN 13 10  CREATININE 0.52 0.33*  CALCIUM 8.8* 9.1    GFR: Estimated Creatinine Clearance: 97.4 mL/min (A) (by C-G formula based on SCr of 0.33 mg/dL (L)).  Liver Function Tests: Recent Labs  Lab 10/23/21 0744 10/24/21 0436  AST 19 16  ALT 27 26  ALKPHOS 70 72  BILITOT 0.6 0.6  PROT 7.2 7.2  ALBUMIN 3.6 3.4*     Coagulation Profile: Recent Labs  Lab 10/19/21 0834  INR 1.0      Recent Results (from the past 240 hour(s))  MRSA Next Gen by PCR, Nasal     Status: None   Collection Time: 10/19/21  3:01 PM   Specimen: Nasal Mucosa; Nasal Swab  Result Value Ref Range Status   MRSA by PCR Next Gen NOT DETECTED NOT DETECTED Final    Comment: (NOTE) The GeneXpert MRSA Assay (FDA approved for NASAL specimens only), is one component of a comprehensive MRSA colonization surveillance program. It is not intended to diagnose MRSA infection nor to guide or monitor treatment for MRSA infections. Test performance is not FDA approved in patients less than 62 years old. Performed at [facility], [place] [address], [place], [place] [address]   Culture, blood (Routine X 2) w Reflex to ID Panel     Status: None (Preliminary result)   Collection Time: 10/23/21  7:44 AM   Specimen: BLOOD  Result Value Ref Range Status   Specimen Description   Final    BLOOD BLOOD LEFT HAND Performed at [place] [address], [place], [place] [address]    Special Requests   Final    BOTTLES DRAWN AEROBIC AND ANAEROBIC Blood Culture results may not be optimal due to an inadequate volume of blood received in culture bottles Performed at [place] [address], [place], [place] [address]    Culture   Final    NO GROWTH < 24 HOURS Performed at [place] [address], [place], [place] [address]    Report Status PENDING  Incomplete  Culture, blood (Routine X 2) w Reflex to ID Panel     Status: None (Preliminary result)   Collection Time: 10/23/21  8:07 AM   Specimen: BLOOD  Result Value Ref Range Status   Specimen Description   Final    BLOOD BLOOD LEFT FOREARM  Performed at [facility], [place] [address], [place], [place] [address]    Special Requests   Final    BOTTLES DRAWN AEROBIC ONLY Blood Culture adequate volume Performed at [place] [address], [place], [place] [address]    Culture   Final    NO GROWTH < 24 HOURS Performed at [facility] [address] [place], [place] [address]    Report Status PENDING  Incomplete  Urine Culture     Status: None   Collection Time: 10/23/21  8:52 AM   Specimen: Urine, Catheterized  Result Value Ref Range Status   Specimen Description   Final    URINE, CATHETERIZED Performed at [place] [address] [place], [place] [address]    Special Requests   Final    NONE Performed at [facility], [place] [address] [place], [place] [address]    Culture   Final    NO GROWTH Performed at [facility], [place] [address], [place], [place] [address]    Report Status 10/24/2021 FINAL  Final      Radiology Studies: VAS [place] LOWER EXTREMITY VENOUS (DVT)  Result Date: 10/23/2021  Lower Venous DVT Study Patient Name:  EMILYGRACE GROTHE [facility]  Date of Exam:   10/23/2021 Medical Rec #: [id number]                 Accession #:    [id number] Date of Birth: Feb 13, 1981                 Patient Gender: F Patient Age:   25 years Exam Location:  [facility] Procedure:      VAS [place] LOWER EXTREMITY VENOUS (DVT) Referring Phys: Cherylann Ratel  --------------------------------------------------------------------------------  Indications: Elevated Ddimer.  Risk Factors: None identified. Limitations: Poor ultrasound/tissue interface and patient positioning, patient immobility. Comparison Study: No prior studies. Performing Technologist: Oliver Hum RVT  Examination Guidelines: A complete evaluation includes B-mode imaging, spectral Doppler, color Doppler, and power Doppler as needed of all accessible portions of each vessel. Bilateral testing is considered an integral part of a complete examination. Limited examinations for reoccurring indications may be performed as noted. The reflux portion of the exam is performed with the patient in reverse Trendelenburg.  +---------+---------------+---------+-----------+----------+--------------+ RIGHT    CompressibilityPhasicitySpontaneityPropertiesThrombus Aging +---------+---------------+---------+-----------+----------+--------------+ CFV      Full           Yes      Yes                                 +---------+---------------+---------+-----------+----------+--------------+ SFJ      Full                                                        +---------+---------------+---------+-----------+----------+--------------+ FV Prox  Full                                                        +---------+---------------+---------+-----------+----------+--------------+ FV Mid   Full                                                        +---------+---------------+---------+-----------+----------+--------------+  FV DistalFull                                                        +---------+---------------+---------+-----------+----------+--------------+ PFV      Full                                                        +---------+---------------+---------+-----------+----------+--------------+ POP      Full           Yes      Yes                                  +---------+---------------+---------+-----------+----------+--------------+ PTV      Full                                                        +---------+---------------+---------+-----------+----------+--------------+ PERO     Full                                                        +---------+---------------+---------+-----------+----------+--------------+   +---------+---------------+---------+-----------+----------+--------------+ LEFT     CompressibilityPhasicitySpontaneityPropertiesThrombus Aging +---------+---------------+---------+-----------+----------+--------------+ CFV      Full           Yes      Yes                                 +---------+---------------+---------+-----------+----------+--------------+ SFJ      Full                                                        +---------+---------------+---------+-----------+----------+--------------+ FV Prox  Full                                                        +---------+---------------+---------+-----------+----------+--------------+ FV Mid   Full                                                        +---------+---------------+---------+-----------+----------+--------------+ FV DistalFull                                                        +---------+---------------+---------+-----------+----------+--------------+  PFV      Full                                                        +---------+---------------+---------+-----------+----------+--------------+ POP      Full           Yes      Yes                                 +---------+---------------+---------+-----------+----------+--------------+ PTV      Full                                                        +---------+---------------+---------+-----------+----------+--------------+ PERO     Full                                                         +---------+---------------+---------+-----------+----------+--------------+     Summary: RIGHT: - There is no evidence of deep vein thrombosis in the lower extremity. However, portions of this examination were limited- see technologist comments above.  - No cystic structure found in the popliteal fossa.  LEFT: - There is no evidence of deep vein thrombosis in the lower extremity. However, portions of this examination were limited- see technologist comments above.  - No cystic structure found in the popliteal fossa.  *See table(s) above for measurements and observations. Electronically signed by Monica Martinez MD on 10/23/2021 at 3:53:54 PM.    Final    DG CHEST PORT 1 VIEW  Result Date: 10/23/2021 CLINICAL DATA:  Postop fever. EXAM: PORTABLE CHEST 1 VIEW COMPARISON:  Aug 13, 2021. FINDINGS: The heart size and mediastinal contours are within normal limits. Both lungs are clear. No visible pleural effusions or pneumothorax. No acute osseous abnormality. IMPRESSION: No evidence of acute cardiopulmonary disease. Electronically Signed   By: Margaretha Sheffield M.D.   On: 10/23/2021 08:20       LOS: 1 day   [facility] Pager on [email or web address]  10/24/2021, 11:12 AM

## 2021-10-24 NOTE — Progress Notes (Signed)
5 Days Post-Op  Subjective: [name] is doing well this AM.  Her last fever was to 102 at 1pm on 7/24.  She is currently afeb with stable VS and a slight decrease in the WBC count.  The urine in the NT is slightly blood and drainaing well.  She has some crampy abdominal discomfort that she attributes to her bowel issues.  ROS:  Review of Systems  Gastrointestinal:  Positive for constipation.  All other systems reviewed and are negative.   Anti-infectives: Anti-infectives (From admission, onward)    Start     Dose/Rate Route Frequency Ordered Stop   10/19/21 1415  ceFEPIme (MAXIPIME) 2 g in sodium chloride 0.9 % 100 mL IVPB        2 g 200 mL/hr over 30 Minutes Intravenous Every 8 hours 10/19/21 1319         Current Facility-Administered Medications  Medication Dose Route Frequency Provider Last Rate Last Admin   acetaminophen (TYLENOL) tablet 650 mg  650 mg Oral Q4H PRN Debbrah Alar, PA-C   650 mg at 10/23/21 2212   baclofen (LIORESAL) tablet 20 mg  20 mg Oral QID Debbrah Alar, PA-C   20 mg at 10/24/21 0936   bisacodyl (DULCOLAX) suppository 10 mg  10 mg Rectal Daily PRN Irine Seal, MD   10 mg at 10/23/21 0916   ceFEPIme (MAXIPIME) 2 g in sodium chloride 0.9 % 100 mL IVPB  2 g Intravenous Q8H Ellington, Abby K, RPH 200 mL/hr at 10/24/21 0551 2 g at 10/24/21 0551   diazepam (VALIUM) tablet 10 mg  10 mg Oral QHS Irine Seal, MD   10 mg at 10/23/21 2212   diazepam (VALIUM) tablet 5 mg  5 mg Oral Daily Debbrah Alar, PA-C   5 mg at 10/24/21 0935   diphenhydrAMINE (BENADRYL) injection 12.5-25 mg  12.5-25 mg Intravenous Q6H PRN Debbrah Alar, PA-C       Or   diphenhydrAMINE (BENADRYL) 12.5 MG/5ML elixir 12.5-25 mg  12.5-25 mg Oral Q6H PRN Debbrah Alar, PA-C       gabapentin (NEURONTIN) capsule 800 mg  800 mg Oral TID Dancy, Amanda, PA-C   800 mg at 10/24/21 0935   HYDROmorphone (DILAUDID) injection 0.5-1 mg  0.5-1 mg Intravenous Q2H PRN Debbrah Alar, PA-C       lamoTRIgine (LAMICTAL)  tablet 50 mg  50 mg Oral Daily Debbrah Alar, PA-C   50 mg at 10/24/21 0936   OLANZapine (ZYPREXA) tablet 2.5 mg  2.5 mg Oral Daily Debbrah Alar, PA-C   2.5 mg at 10/24/21 0936   ondansetron (ZOFRAN) injection 4 mg  4 mg Intravenous Q4H PRN Debbrah Alar, PA-C   4 mg at 10/19/21 1318   Oral care mouth rinse  15 mL Mouth Rinse PRN Irine Seal, MD       oxyCODONE (Oxy IR/ROXICODONE) immediate release tablet 5 mg  5 mg Oral Q4H PRN Debbrah Alar, PA-C   5 mg at 10/21/21 1330   pantoprazole (PROTONIX) EC tablet 40 mg  40 mg Oral Daily Debbrah Alar, PA-C   40 mg at 10/24/21 3329   senna (SENOKOT) tablet 8.6 mg  1 tablet Oral Daily PRN Florentina Addison, MD   8.6 mg at 10/22/21 2305   sertraline (ZOLOFT) tablet 100 mg  100 mg Oral BID Debbrah Alar, PA-C   100 mg at 10/24/21 0936   sodium chloride flush (NS) 0.9 % injection 5 mL  5 mL Intracatheter Q8H Arne Cleveland, MD   5 mL at  10/23/21 2211     Objective: Vital signs in last 24 hours: Temp:  [98.5 F (36.9 C)-102 F (38.9 C)] 98.5 F (36.9 C) (07/25 0421) Pulse Rate:  [88-107] 88 (07/25 0421) Resp:  [15-20] 15 (07/25 0421) BP: (118-141)/(69-88) 118/72 (07/25 0421) SpO2:  [96 %-99 %] 98 % (07/25 0421)  Intake/Output from previous day: 07/24 0701 - 07/25 0700 In: 10 [I.V.:5] Out: 550 [Urine:550] Intake/Output this shift: Total I/O In: 120 [P.O.:120] Out: -    Physical Exam Vitals reviewed.  Constitutional:      Appearance: Normal appearance.  Cardiovascular:     Rate and Rhythm: Normal rate and regular rhythm.  Pulmonary:     Effort: Pulmonary effort is normal.     Breath sounds: Normal breath sounds.  Abdominal:     Palpations: Abdomen is soft.     Tenderness: There is no abdominal tenderness.     Comments: Right nephrostomy tube draining blood tinged urine.    Conduit draining clear urine.  Musculoskeletal:        General: No swelling.  Neurological:     Mental Status: She is alert and oriented to person, place, and  time.     Lab Results:  Recent Labs    10/23/21 0806 10/24/21 0436  WBC 13.7* 13.2*  HGB 12.7 12.3  HCT 40.3 39.9  PLT 249 240   BMET Recent Labs    10/23/21 0744 10/24/21 0436  NA 139 140  K 3.6 3.4*  CL 107 107  CO2 24 25  GLUCOSE 149* 145*  BUN 13 10  CREATININE 0.52 0.33*  CALCIUM 8.8* 9.1   PT/INR No results for input(s): "LABPROT", "INR" in the last 72 hours. ABG No results for input(s): "PHART", "HCO3" in the last 72 hours.  Invalid input(s): "PCO2", "PO2"  Studies/Results: VAS [place] LOWER EXTREMITY VENOUS (DVT)  Result Date: 10/23/2021  Lower Venous DVT Study Patient Name:  [name] [facility]  Date of Exam:   10/23/2021 Medical Rec #: [id number]                 Accession #:    [id number] Date of Birth: 1980/11/08                 Patient Gender: F Patient Age:   41 years Exam Location:  [facility] Procedure:      VAS [place] LOWER EXTREMITY VENOUS (DVT) Referring Phys: Cherylann Ratel --------------------------------------------------------------------------------  Indications: Elevated Ddimer.  Risk Factors: None identified. Limitations: Poor ultrasound/tissue interface and patient positioning, patient immobility. Comparison Study: No prior studies. Performing Technologist: Oliver Hum RVT  Examination Guidelines: A complete evaluation includes B-mode imaging, spectral Doppler, color Doppler, and power Doppler as needed of all accessible portions of each vessel. Bilateral testing is considered an integral part of a complete examination. Limited examinations for reoccurring indications may be performed as noted. The reflux portion of the exam is performed with the patient in reverse Trendelenburg.  +---------+---------------+---------+-----------+----------+--------------+ RIGHT    CompressibilityPhasicitySpontaneityPropertiesThrombus Aging +---------+---------------+---------+-----------+----------+--------------+ CFV      Full           Yes      Yes                                  +---------+---------------+---------+-----------+----------+--------------+ SFJ      Full                                                        +---------+---------------+---------+-----------+----------+--------------+  FV Prox  Full                                                        +---------+---------------+---------+-----------+----------+--------------+ FV Mid   Full                                                        +---------+---------------+---------+-----------+----------+--------------+ FV DistalFull                                                        +---------+---------------+---------+-----------+----------+--------------+ PFV      Full                                                        +---------+---------------+---------+-----------+----------+--------------+ POP      Full           Yes      Yes                                 +---------+---------------+---------+-----------+----------+--------------+ PTV      Full                                                        +---------+---------------+---------+-----------+----------+--------------+ PERO     Full                                                        +---------+---------------+---------+-----------+----------+--------------+   +---------+---------------+---------+-----------+----------+--------------+ LEFT     CompressibilityPhasicitySpontaneityPropertiesThrombus Aging +---------+---------------+---------+-----------+----------+--------------+ CFV      Full           Yes      Yes                                 +---------+---------------+---------+-----------+----------+--------------+ SFJ      Full                                                        +---------+---------------+---------+-----------+----------+--------------+ FV Prox  Full                                                         +---------+---------------+---------+-----------+----------+--------------+  FV Mid   Full                                                        +---------+---------------+---------+-----------+----------+--------------+ FV DistalFull                                                        +---------+---------------+---------+-----------+----------+--------------+ PFV      Full                                                        +---------+---------------+---------+-----------+----------+--------------+ POP      Full           Yes      Yes                                 +---------+---------------+---------+-----------+----------+--------------+ PTV      Full                                                        +---------+---------------+---------+-----------+----------+--------------+ PERO     Full                                                        +---------+---------------+---------+-----------+----------+--------------+     Summary: RIGHT: - There is no evidence of deep vein thrombosis in the lower extremity. However, portions of this examination were limited- see technologist comments above.  - No cystic structure found in the popliteal fossa.  LEFT: - There is no evidence of deep vein thrombosis in the lower extremity. However, portions of this examination were limited- see technologist comments above.  - No cystic structure found in the popliteal fossa.  *See table(s) above for measurements and observations. Electronically signed by Monica Martinez MD on 10/23/2021 at 3:53:54 PM.    Final    DG CHEST PORT 1 VIEW  Result Date: 10/23/2021 CLINICAL DATA:  Postop fever. EXAM: PORTABLE CHEST 1 VIEW COMPARISON:  Aug 13, 2021. FINDINGS: The heart size and mediastinal contours are within normal limits. Both lungs are clear. No visible pleural effusions or pneumothorax. No acute osseous abnormality. IMPRESSION: No evidence of acute cardiopulmonary disease.  Electronically Signed   By: Margaretha Sheffield M.D.   On: 10/23/2021 08:20   VAS [place] LOWER EXTREMITY VENOUS (DVT)  Result Date: 10/23/2021  Lower Venous DVT Study Patient Name:  PARRISH DADDARIO [facility]  Date of Exam:   10/23/2021 Medical Rec #: [id number]                 Accession #:    [id number] Date of Birth: 22-Apr-1980  Patient Gender: F Patient Age:   33 years Exam Location:  [facility] Procedure:      VAS [place] LOWER EXTREMITY VENOUS (DVT) Referring Phys: Cherylann Ratel --------------------------------------------------------------------------------  Indications: Elevated Ddimer.  Risk Factors: None identified. Limitations: Poor ultrasound/tissue interface and patient positioning, patient immobility. Comparison Study: No prior studies. Performing Technologist: Oliver Hum RVT  Examination Guidelines: A complete evaluation includes B-mode imaging, spectral Doppler, color Doppler, and power Doppler as needed of all accessible portions of each vessel. Bilateral testing is considered an integral part of a complete examination. Limited examinations for reoccurring indications may be performed as noted. The reflux portion of the exam is performed with the patient in reverse Trendelenburg.  +---------+---------------+---------+-----------+----------+--------------+ RIGHT    CompressibilityPhasicitySpontaneityPropertiesThrombus Aging +---------+---------------+---------+-----------+----------+--------------+ CFV      Full           Yes      Yes                                 +---------+---------------+---------+-----------+----------+--------------+ SFJ      Full                                                        +---------+---------------+---------+-----------+----------+--------------+ FV Prox  Full                                                        +---------+---------------+---------+-----------+----------+--------------+ FV Mid   Full                                                         +---------+---------------+---------+-----------+----------+--------------+ FV DistalFull                                                        +---------+---------------+---------+-----------+----------+--------------+ PFV      Full                                                        +---------+---------------+---------+-----------+----------+--------------+ POP      Full           Yes      Yes                                 +---------+---------------+---------+-----------+----------+--------------+ PTV      Full                                                        +---------+---------------+---------+-----------+----------+--------------+  PERO     Full                                                        +---------+---------------+---------+-----------+----------+--------------+   +---------+---------------+---------+-----------+----------+--------------+ LEFT     CompressibilityPhasicitySpontaneityPropertiesThrombus Aging +---------+---------------+---------+-----------+----------+--------------+ CFV      Full           Yes      Yes                                 +---------+---------------+---------+-----------+----------+--------------+ SFJ      Full                                                        +---------+---------------+---------+-----------+----------+--------------+ FV Prox  Full                                                        +---------+---------------+---------+-----------+----------+--------------+ FV Mid   Full                                                        +---------+---------------+---------+-----------+----------+--------------+ FV DistalFull                                                        +---------+---------------+---------+-----------+----------+--------------+ PFV      Full                                                         +---------+---------------+---------+-----------+----------+--------------+ POP      Full           Yes      Yes                                 +---------+---------------+---------+-----------+----------+--------------+ PTV      Full                                                        +---------+---------------+---------+-----------+----------+--------------+ PERO     Full                                                        +---------+---------------+---------+-----------+----------+--------------+  Summary: RIGHT: - There is no evidence of deep vein thrombosis in the lower extremity. However, portions of this examination were limited- see technologist comments above.  - No cystic structure found in the popliteal fossa.  LEFT: - There is no evidence of deep vein thrombosis in the lower extremity. However, portions of this examination were limited- see technologist comments above.  - No cystic structure found in the popliteal fossa.  *See table(s) above for measurements and observations. Electronically signed by Monica Martinez MD on 10/23/2021 at 3:53:54 PM.    Final    DG CHEST PORT 1 VIEW  Result Date: 10/23/2021 CLINICAL DATA:  Postop fever. EXAM: PORTABLE CHEST 1 VIEW COMPARISON:  Aug 13, 2021. FINDINGS: The heart size and mediastinal contours are within normal limits. Both lungs are clear. No visible pleural effusions or pneumothorax. No acute osseous abnormality. IMPRESSION: No evidence of acute cardiopulmonary disease. Electronically Signed   By: Margaretha Sheffield M.D.   On: 10/23/2021 08:20   CT RENAL STONE STUDY  Result Date: 10/21/2021 CLINICAL DATA:  Nephrolithiasis. Status post right percutaneous nephrolithotomy EXAM: CT ABDOMEN AND PELVIS WITHOUT CONTRAST TECHNIQUE: Multidetector CT imaging of the abdomen and pelvis was performed following the standard protocol without IV contrast. RADIATION DOSE REDUCTION: This exam was performed according to the departmental  dose-optimization program which includes automated exposure control, adjustment of the mA and/or kV according to patient size and/or use of iterative reconstruction technique. COMPARISON:  08/14/2021 FINDINGS: Lower chest: Subsegmental atelectasis noted in the lung bases. Hepatobiliary: There is diffuse hepatic steatosis. No focal liver abnormality. Cholecystectomy. No bile duct dilatation. Pancreas: Unremarkable. No pancreatic ductal dilatation or surrounding inflammatory changes. Spleen: Spleen is enlarged measuring 14 cm in cranial caudal dimension. No focal splenic lesion Adrenals/Urinary Tract: Normal adrenal glands. There is a single stone within the upper pole collecting system of the left kidney measuring 2-3 mm, image 72/5. On the previous exam there was a stone within the left upper pole, lower pole and central renal pelvis. No signs of left-sided hydronephrosis. No left ureteral calculi identified. Right-sided large caliber percutaneous nephrostomy tube is in place with tip terminating in the inferior pole collecting system. There is also a right-sided percutaneous nephroureteral stent in place. There is a single stone identified within the upper pole collecting system of the left kidney measuring 4 mm, image 48/6 and image 41/2. On the previous exam there are multiple stones within the upper and lower pole collecting system. The largest on the previous exam was in the upper pole measuring 1 cm. Moderate right hydronephrosis and hydroureter identified. There is a small amount of gas within the right renal pelvis likely reflecting intervention. The patient is status post urinary diversion with right abdominal loop ileostomy. The tip of the right ureteral stent terminates just proximal to the ostomy site a, image 78/2. There are no stones identified along the course of the right ureter to the loop ileostomy. Decompressed urinary bladder is identified within the ventral pelvis. Stomach/Bowel: Stomach appears  normal. No bowel wall thickening, inflammation, or distension. There is a moderate stool burden identified within the colon. Vascular/Lymphatic: No significant vascular findings are present. No enlarged abdominal or pelvic lymph nodes. Reproductive: Uterus and bilateral adnexa are unremarkable. Other: No significant free fluid or fluid collections identified. No signs of pneumoperitoneum. Musculoskeletal: No acute osseous findings. Prominent muscular atrophy identified within the visualized portions of the lower extremities. IMPRESSION: 1. Status post right-sided percutaneous nephrostomy tube and right-sided percutaneous nephroureteral stent placement. 2. Moderate  right hydronephrosis and hydroureter. No stones identified along the course of the right ureter to the loop ileostomy. 3. Bilateral nephrolithiasis. Compared with the previous exam there is a single stone remaining within the upper pole collecting system of the left kidney measuring 2-3 mm. A single stone within the upper pole collecting system of the right kidney remains measuring 4 mm. 4. Hepatic steatosis. 5. Splenomegaly. Electronically Signed   By: Kerby Moors M.D.   On: 10/21/2021 08:08   IR NEPHROSTOMY PLACEMENT LEFT  Result Date: 10/20/2021 CLINICAL DATA:  Symptomatic right nephrolithiasis, planned percutaneous nephrolithotomy EXAM: RIGHT PERCUTANEOUS NEPHROURETERAL CATHETER PLACEMENT UNDER FLUOROSCOPIC GUIDANCE FLUOROSCOPY: Radiation Exposure Index (as provided by the fluoroscopic device): 30 mGy air Kerma TECHNIQUE: The procedure, risks (including but not limited to bleeding, infection, organ damage ), benefits, and alternatives were explained to the patient. Questions regarding the procedure were encouraged and answered. The patient understands and consents to the procedure. Right flank region prepped with chlorhexidine, draped in usual sterile fashion, infiltrated locally with 1% lidocaine. Intravenous Fentanyl 165mg and Versed '3mg'$  were  administered as conscious sedation during continuous monitoring of the patient's level of consciousness and physiological / cardiorespiratory status by the radiology RN, with a total moderate sedation time of 30 minutes. Under real-time fluoroscopic guidance, a 21-gauge trocar needle was advanced into a posterior upper pole calyx using the peripheral radiodense calculus as a guide. Contrast injection demonstrated good positioning in the collecting system. A 018 guidewire advanced easily down the ureter. Needle was exchanged over a guidewire for transitional dilator. Catheter was exchanged over a guidewire for a 5 [place] catheter, advanced into the ileal conduit. Radiograph confirms appropriate nephroureteral catheter positioning. Catheter capped and secured externally. The patient tolerated the procedure well. COMPLICATIONS: COMPLICATIONS None. IMPRESSION: 1. Technically successful right antegrade percutaneous nephroureteral catheter placement. Electronically Signed   By: DLucrezia EuropeM.D.   On: 10/20/2021 17:51   IR NEPHROSTOMY EXCHANGE RIGHT  Result Date: 10/20/2021 CLINICAL DATA:  Symptomatic right nephrolithiasis, planned percutaneous nephrolithotomy EXAM: RIGHT PERCUTANEOUS NEPHROURETERAL CATHETER PLACEMENT UNDER FLUOROSCOPIC GUIDANCE FLUOROSCOPY: Radiation Exposure Index (as provided by the fluoroscopic device): 30 mGy air Kerma TECHNIQUE: The procedure, risks (including but not limited to bleeding, infection, organ damage ), benefits, and alternatives were explained to the patient. Questions regarding the procedure were encouraged and answered. The patient understands and consents to the procedure. Right flank region prepped with chlorhexidine, draped in usual sterile fashion, infiltrated locally with 1% lidocaine. Intravenous Fentanyl 125m and Versed '3mg'$  were administered as conscious sedation during continuous monitoring of the patient's level of consciousness and physiological / cardiorespiratory  status by the radiology RN, with a total moderate sedation time of 30 minutes. Under real-time fluoroscopic guidance, a 21-gauge trocar needle was advanced into a posterior upper pole calyx using the peripheral radiodense calculus as a guide. Contrast injection demonstrated good positioning in the collecting system. A 018 guidewire advanced easily down the ureter. Needle was exchanged over a guidewire for transitional dilator. Catheter was exchanged over a guidewire for a 5 [place] catheter, advanced into the ileal conduit. Radiograph confirms appropriate nephroureteral catheter positioning. Catheter capped and secured externally. The patient tolerated the procedure well. COMPLICATIONS: COMPLICATIONS None. IMPRESSION: 1. Technically successful right antegrade percutaneous nephroureteral catheter placement. Electronically Signed   By: D Lucrezia Europe.D.   On: 10/20/2021 17:51   DG C-Arm 1-60 Min-No Report  Result Date: 10/20/2021 Fluoroscopy was utilized by the requesting physician.  No radiographic interpretation.   DG C-Arm 1-60  Min-No Report  Result Date: 10/20/2021 Fluoroscopy was utilized by the requesting physician.  No radiographic interpretation.     Assessment and Plan: Bilateral renal stones now s/p right PCNL with minimal residual stone and passage of a left renal pelvic stone.   The tube was plugged earlier this morning and well tolerated so I removed the NT.  Postop fever.  She has been afebrile for the last 22 hours.  If she remains so, she could be considered for discharge tomorrow.    Constipation.  She had results with the Dulcolax suppository yesterday.       LOS: 1 day    Irine Seal 10/24/2021 [phone] Patient ID: Reginia Naas, female   DOB: 26-Nov-1980, 41 y.o.   MRN: [id number] Patient ID: Natasia Michella Detjen, female   DOB: 1981-02-13, 41 y.o.   MRN: [id number]

## 2021-10-25 DIAGNOSIS — R5082 Postprocedural fever: Secondary | ICD-10-CM | POA: Diagnosis not present

## 2021-10-25 LAB — CBC
HCT: 37.8 % (ref 36.0–46.0)
Hemoglobin: 11.5 g/dL — ABNORMAL LOW (ref 12.0–15.0)
MCH: 24.5 pg — ABNORMAL LOW (ref 26.0–34.0)
MCHC: 30.4 g/dL (ref 30.0–36.0)
MCV: 80.4 fL (ref 80.0–100.0)
Platelets: 232 10*3/uL (ref 150–400)
RBC: 4.7 MIL/uL (ref 3.87–5.11)
RDW: 17.7 % — ABNORMAL HIGH (ref 11.5–15.5)
WBC: 9.2 10*3/uL (ref 4.0–10.5)
nRBC: 0 % (ref 0.0–0.2)

## 2021-10-25 LAB — BASIC METABOLIC PANEL
Anion gap: 10 (ref 5–15)
BUN: 10 mg/dL (ref 6–20)
CO2: 25 mmol/L (ref 22–32)
Calcium: 8.7 mg/dL — ABNORMAL LOW (ref 8.9–10.3)
Chloride: 105 mmol/L (ref 98–111)
Creatinine, Ser: <0.3 mg/dL — ABNORMAL LOW (ref 0.44–1.00)
Glucose, Bld: 130 mg/dL — ABNORMAL HIGH (ref 70–99)
Potassium: 4.1 mmol/L (ref 3.5–5.1)
Sodium: 140 mmol/L (ref 135–145)

## 2021-10-25 LAB — PROCALCITONIN: Procalcitonin: <0.1 ng/mL

## 2021-10-25 LAB — MAGNESIUM: Magnesium: 2.2 mg/dL (ref 1.7–2.4)

## 2021-10-25 NOTE — Progress Notes (Signed)
-  received message for [facility] from 7/20-7/26 faxed w/confirmation to [phone].

## 2021-10-25 NOTE — Progress Notes (Signed)
[facility] PROGRESS NOTE   [name] EVO:[id number] DOB: October 06, 1980 DOA: 10/19/2021  PCP: Patient, No Pcp Per  Brief History/Interval Summary: 41 y.o. female with past medical history of transverse myelitis w/ spastic diplegia, neurogenic bladder, depression, renal stones, PTSD, GERD. Presenting to urology service for infected renal stones. She had a right antegrade nephroureteral catheter placement and right nephrolithotomy/nephrostomy on 7/21. She tolerated the procedures well.  Subsequently developed fever.  Hospitalist was consulted for assistance.     Subjective/Interval History: Seen this morning after urology had already seen and discharged the patient.  Mother at bedside.  Patient reports some nausea but denies any other complaints.  Both are anxious to return home.  Assessment/Plan:  Fever Etiology unclear but presumably due to her GU issues.  Evaluation so far including CBC which shows mild leukocytosis.  Urine culture negative.  Blood cultures x2: NTD.  COVID-19 and flu panel PCR negative.  D-dimer was mildly elevated.  Lower extremity Doppler studies negative for DVT.  No hypoxia tachycardia or dyspnea.  No reason to proceed with CT angiogram at this time. She does have a cough however her chest x-ray was negative.   Patient noted to be on cefepime.  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  Procalcitonin is only 0.1, now down to <0.1.  Completed 6 days of IV cefepime.  Has defervesced without any fever since 7/24 afternoon.  Mild leukocytosis has resolved.  Agree with discharging off of antibiotics with close outpatient monitoring for recurrence of fever in which case will need to seek immediate medical attention.  Nephrolithiasis Status post nephrolithotomy and nephrostomy tube placement.  Management per urology.  History of transverse myelitis with spastic diplegia This is chronic for her.  She apparently resides in a nursing facility.  Noted to  be on baclofen gabapentin, Valium.  History of depression/PTSD Stable.  Noted to be on Lamictal, Zyprexa, Zoloft.  Hypokalemia Replaced and corrected.  Magnesium 2.2.  Anemia, mild May be dilutional.  Outpatient follow-up.  As stated above, patient discharged by primary service.    Medications: Scheduled:  baclofen  20 mg Oral QID   diazepam  10 mg Oral QHS   diazepam  5 mg Oral Daily   gabapentin  800 mg Oral TID   lamoTRIgine  50 mg Oral Daily   OLANZapine  2.5 mg Oral Daily   pantoprazole  40 mg Oral Daily   sertraline  100 mg Oral BID   sodium chloride flush  5 mL Intracatheter Q8H   Continuous:  ceFEPime (MAXIPIME) IV 2 g (10/25/21 0523)   EXH:BZJIRCVELFYBO, bisacodyl, diphenhydrAMINE **OR** diphenhydrAMINE, HYDROmorphone (DILAUDID) injection, ondansetron, mouth rinse, oxyCODONE, senna  Antibiotics: Anti-infectives (From admission, onward)    Start     Dose/Rate Route Frequency Ordered Stop   10/19/21 1415  ceFEPIme (MAXIPIME) 2 g in sodium chloride 0.9 % 100 mL IVPB        2 g 200 mL/hr over 30 Minutes Intravenous Every 8 hours 10/19/21 1319         Objective:  Vital Signs  Vitals:   10/24/21 1445 10/24/21 2219 10/25/21 0521 10/25/21 1003  BP:  112/67 133/85 116/65  Pulse:  79 76 85  Resp:  '18 18 20  '$ Temp: 99.5 F (37.5 C) 97.9 F (36.6 C) 98.2 F (36.8 C) 98.5 F (36.9 C)  TempSrc: Oral Oral Oral Oral  SpO2:  95% 100% 100%  Weight:      Height:  Intake/Output Summary (Last 24 hours) at 10/25/2021 1507 Last data filed at 10/25/2021 0537 Gross per 24 hour  Intake 1203.55 ml  Output 1450 ml  Net -246.45 ml   Filed Weights   10/20/21 1100  Weight: 86.5 kg    General appearance: Young female, moderately built and obese, lying comfortably propped up in bed.  Somewhat sleepy but easily arousable and answers questions appropriately. Resp: Clear to auscultation bilaterally.  No increased work of breathing. Cardio: S1 and S2 heard, RRR.  No  JVD or murmurs.  Mild bilateral dorsal foot edema, may be dependent edema and chronic related to paraplegia. GI: Abdomen is soft.  Nontender nondistended.  Bowel sounds are present normal.  No masses organomegaly CNS: Mental status as noted above.  No cranial nerve deficits. Extremities: Chronic paraplegia.    Lab Results:  Data Reviewed: I have personally reviewed following labs and reports of the imaging studies  CBC: Recent Labs  Lab 10/19/21 0834 10/20/21 1816 10/21/21 0548 10/23/21 0806 10/24/21 0436 10/25/21 0506  WBC 7.1  --   --  13.7* 13.2* 9.2  NEUTROABS  --   --   --  11.0* 10.3*  --   HGB 13.0 13.2 12.8 12.7 12.3 11.5*  HCT 42.9 43.1 42.1 40.3 39.9 37.8  MCV 80.2  --   --  78.7* 79.2* 80.4  PLT 238  --   --  249 240 384    Basic Metabolic Panel: Recent Labs  Lab 10/23/21 0744 10/24/21 0436 10/25/21 0506  NA 139 140 140  K 3.6 3.4* 4.1  CL 107 107 105  CO2 '24 25 25  '$ GLUCOSE 149* 145* 130*  BUN '13 10 10  '$ CREATININE 0.52 0.33* <0.30*  CALCIUM 8.8* 9.1 8.7*  MG  --   --  2.2    GFR: CrCl cannot be calculated (This lab value cannot be used to calculate CrCl because it is not a number: <0.30).  Liver Function Tests: Recent Labs  Lab 10/23/21 0744 10/24/21 0436  AST 19 16  ALT 27 26  ALKPHOS 70 72  BILITOT 0.6 0.6  PROT 7.2 7.2  ALBUMIN 3.6 3.4*     Coagulation Profile: Recent Labs  Lab 10/19/21 0834  INR 1.0      Recent Results (from the past 240 hour(s))  MRSA Next Gen by PCR, Nasal     Status: None   Collection Time: 10/19/21  3:01 PM   Specimen: Nasal Mucosa; Nasal Swab  Result Value Ref Range Status   MRSA by PCR Next Gen NOT DETECTED NOT DETECTED Final    Comment: (NOTE) The GeneXpert MRSA Assay (FDA approved for NASAL specimens only), is one component of a comprehensive MRSA colonization surveillance program. It is not intended to diagnose MRSA infection nor to guide or monitor treatment for MRSA infections. Test  performance is not FDA approved in patients less than 22 years old. Performed at [facility], [place] [address], [place], [place] [address]   Culture, blood (Routine X 2) w Reflex to ID Panel     Status: None (Preliminary result)   Collection Time: 10/23/21  7:44 AM   Specimen: BLOOD  Result Value Ref Range Status   Specimen Description   Final    BLOOD BLOOD LEFT HAND Performed at [facility] [address], [place], [place] [address]    Special Requests   Final    BOTTLES DRAWN AEROBIC AND ANAEROBIC Blood Culture results may not be optimal due  to an inadequate volume of blood received in culture bottles Performed at [facility] [address], [place], [place] [address]    Culture   Final    NO GROWTH 2 DAYS Performed at [facility] [address], [place], [place] [address]    Report Status PENDING  Incomplete  Culture, blood (Routine X 2) w Reflex to ID Panel     Status: None (Preliminary result)   Collection Time: 10/23/21  8:07 AM   Specimen: BLOOD  Result Value Ref Range Status   Specimen Description   Final    BLOOD BLOOD LEFT FOREARM Performed at [facility] [address] [place], [place] [address]    Special Requests   Final    BOTTLES DRAWN AEROBIC ONLY Blood Culture adequate volume Performed at [facility] [address] [place], [place] [address]    Culture   Final    NO GROWTH 2 DAYS Performed at [facility] [address], [place], [place] [address]    Report Status PENDING  Incomplete  Urine Culture     Status: None   Collection Time: 10/23/21  8:52 AM   Specimen: Urine, Catheterized  Result Value Ref Range Status   Specimen Description   Final    URINE, CATHETERIZED Performed at [facility] [address] [place], [place] [address]    Special Requests   Final    NONE Performed at [facility],  [place] [address] [place], [place] [address]    Culture   Final    NO GROWTH Performed at [facility], [place] [address] [place], [place] [address]    Report Status 10/24/2021 FINAL  Final  Resp Panel by RT-PCR (Flu A&B, Covid) Anterior Nasal Swab     Status: None   Collection Time: 10/24/21  8:45 AM   Specimen: Anterior Nasal Swab  Result Value Ref Range Status   SARS Coronavirus 2 by RT PCR NEGATIVE NEGATIVE Final    Comment: (NOTE) SARS-CoV-2 target nucleic acids are NOT DETECTED.  The SARS-CoV-2 RNA is generally detectable in upper respiratory specimens during the acute phase of infection. The lowest concentration of SARS-CoV-2 viral copies this assay can detect is 138 copies/mL. A negative result does not preclude SARS-Cov-2 infection and should not be used as the sole basis for treatment or other patient management decisions. A negative result may occur with  improper specimen collection/handling, submission of specimen other than nasopharyngeal swab, presence of viral mutation(s) within the areas targeted by this assay, and inadequate number of viral copies(<138 copies/mL). A negative result must be combined with clinical observations, patient history, and epidemiological information. The expected result is Negative.  Fact Sheet for Patients:  EntrepreneurPulse.com.au  Fact Sheet for Healthcare Providers:  IncredibleEmployment.be  This test is no t yet approved or cleared by the [place] FDA and  has been authorized for detection and/or diagnosis of SARS-CoV-2 by FDA under an Emergency Use Authorization (EUA). This EUA will remain  in effect (meaning this test can be used) for the duration of the COVID-19 declaration under Section 564(b)(1) of the Act, 21 U.S.C.section 360bbb-3(b)(1), unless the authorization is terminated  or revoked sooner.       Influenza A by PCR NEGATIVE NEGATIVE Final   Influenza B by PCR NEGATIVE  NEGATIVE Final    Comment: (NOTE) The Xpert Xpress SARS-CoV-2/FLU/RSV plus assay is intended as an aid in the diagnosis  of influenza from Nasopharyngeal swab specimens and should not be used as a sole basis for treatment. Nasal washings and aspirates are unacceptable for Xpert Xpress SARS-CoV-2/FLU/RSV testing.  Fact Sheet for Patients: EntrepreneurPulse.com.au  Fact Sheet for Healthcare Providers: IncredibleEmployment.be  This test is not yet approved or cleared by the [place] FDA and has been authorized for detection and/or diagnosis of SARS-CoV-2 by FDA under an Emergency Use Authorization (EUA). This EUA will remain in effect (meaning this test can be used) for the duration of the COVID-19 declaration under Section 564(b)(1) of the Act, 21 U.S.C. section 360bbb-3(b)(1), unless the authorization is terminated or revoked.  Performed at [facility], [place] [address] [place], [place] [address]       Radiology Studies: No results found.     LOS: 2 days   Vernell Leep, MD,  FACP, [facility], [facility], [facility] (Care Management Physician Certified) [facility] [place]  To contact the attending provider between 7A-7P or the covering provider during after hours 7P-7A, please log into the web site [email or web address] and access using universal [place] password for that web site. If you do not have the password, please call the hospital operator.

## 2021-10-25 NOTE — Progress Notes (Signed)
RN reached out to receiving facility at this time to give report, no answer. Patient has left [facility] by [employer].

## 2021-10-25 NOTE — Discharge Summary (Signed)
Physician Discharge Summary  Patient ID: [name] MRN: [id number] DOB/AGE: 41-17-1982 41 y.o.  Admit date: 10/19/2021 Discharge date: 10/25/2021  Admission Diagnoses:  Bilateral renal stones  Discharge Diagnoses:  Principal Problem:   Bilateral renal stones Active Problems:   Paraplegia (HCC)   History of transverse myelitis   Depression   Fever   PTSD (post-traumatic stress disorder)   GERD (gastroesophageal reflux disease)   Past Medical History:  Diagnosis Date   Bladder stones    Hx of   Depression    GERD (gastroesophageal reflux disease)    Intertrigo 04/24/2017   Left upper extremity swelling 04/24/2017   Multiple sclerosis (HCC)    Paraplegia (HCC)    Polycystic disease, ovaries    PTSD (post-traumatic stress disorder)    Transverse myelitis ([place])    Yeast infection 09/26/2017    Surgeries: Left PCNL on 10/19/2021   Consultants (if any): Treatment Team:  Lucas Mallow, MD  Discharged Condition: Improved  Hospital Course: [name] is an 41 y.o. female who was admitted 10/19/2021 with a diagnosis of Bilateral renal stones and went to the operating room on 10/19/2021 and underwent the above named procedures.  A CT on POD#1 showed only 1 small residual right renal fragment and only one tiny left renal stone.  The previously noted left renal pelvic stone had passed.  She had a post op fever with a leukocytosis but CXR, blood and urine cultures were negative on Cefepime.  She last had fever on 7/24 and the tube was clamped and then removed on 7/25.  She is doing well with minimal site drainage and clear urine from her conduit.  She is ready for discharge today.   She was given perioperative antibiotics:  Anti-infectives (From admission, onward)    Start     Dose/Rate Route Frequency Ordered Stop   10/19/21 1415  ceFEPIme (MAXIPIME) 2 g in sodium chloride 0.9 % 100 mL IVPB        2 g 200 mL/hr over 30 Minutes Intravenous Every 8  hours 10/19/21 1319       .  She was given sequential compression devices, for DVT prophylaxis.  She benefited maximally from the hospital stay her admission was complicated by fever.    Recent vital signs:  Vitals:   10/24/21 2219 10/25/21 0521  BP: 112/67 133/85  Pulse: 79 76  Resp: 18 18  Temp: 97.9 F (36.6 C) 98.2 F (36.8 C)  SpO2: 95% 100%    Recent laboratory studies:  Lab Results  Component Value Date   HGB 11.5 (L) 10/25/2021   HGB 12.3 10/24/2021   HGB 12.7 10/23/2021   Lab Results  Component Value Date   WBC 9.2 10/25/2021   PLT 232 10/25/2021   Lab Results  Component Value Date   INR 1.0 10/19/2021   Lab Results  Component Value Date   NA 140 10/25/2021   K 4.1 10/25/2021   CL 105 10/25/2021   CO2 25 10/25/2021   BUN 10 10/25/2021   CREATININE <0.30 (L) 10/25/2021   GLUCOSE 130 (H) 10/25/2021    Discharge Medications:   Allergies as of 10/25/2021       Reactions   Iodine Anaphylaxis   Latex Anaphylaxis   Other Other (See Comments)   Documented on Order Summary Report - unknown reaction Apricot, collard greens, green bean, melon, peaches, turnip greens   Polymyxin B Hives   Pt had hives in right arm with infusion of  polymyxin on November 26th, 2018   Shellfish-derived Products Anaphylaxis   All seafood   Vancomycin Anaphylaxis   Iodinated Contrast Media Hives   Patient requires 13 hour  prep prior to administration   Strawberry Extract Hives, Rash        Medication List     TAKE these medications    acetaminophen 325 MG tablet Commonly known as: TYLENOL Take 650 mg by mouth every 4 (four) hours as needed for headache, mild pain or fever.   acetaminophen-codeine 300-30 MG tablet Commonly known as: TYLENOL #3 Take by mouth every 6 (six) hours as needed for moderate pain or severe pain. DO NOT EXCEED 4000 MG OF TYLENOL IN A DAY   baclofen 20 MG tablet Commonly known as: LIORESAL Take 1 tablet (20 mg total) by mouth 4 (four)  times daily.   diazepam 5 MG tablet Commonly known as: VALIUM Take 1-2 tablets (5-10 mg total) by mouth 2 (two) times daily. Take 1 tablet (5 mg) BID and Take 2 tablets (10 mg) at bedtime What changed:  when to take this additional instructions   ferrous sulfate 325 (65 FE) MG tablet Take 1 tablet (325 mg total) by mouth daily with breakfast.   fluticasone 50 MCG/ACT nasal spray Commonly known as: FLONASE Place 1 spray into the nose daily.   gabapentin 400 MG capsule Commonly known as: NEURONTIN Take 800 mg by mouth 3 (three) times daily.   LACTOBACILLUS PO Take 1 capsule by mouth daily.   lamoTRIgine 25 MG tablet Commonly known as: LAMICTAL Take 2 tablets (50 mg total) by mouth daily.   loratadine 10 MG tablet Commonly known as: CLARITIN Take 10 mg by mouth daily.   menthol-cetylpyridinium 3 MG lozenge Commonly known as: CEPACOL Take 1 lozenge by mouth every 4 (four) hours as needed for sore throat.   multivitamin with minerals tablet Take 1 tablet by mouth daily.   nystatin cream Commonly known as: MYCOSTATIN Apply 1 application  topically at bedtime as needed (vaginal). Apply to vaginal area   OLANZapine 2.5 MG tablet Commonly known as: ZYPREXA Take 2.5 mg by mouth daily.   omeprazole 20 MG capsule Commonly known as: PRILOSEC Take 20 mg by mouth daily.   ondansetron 4 MG tablet Commonly known as: ZOFRAN Take 4 mg by mouth every 8 (eight) hours as needed for nausea or vomiting.   PROBIOTIC PO Take 1 capsule by mouth daily.   sertraline 100 MG tablet Commonly known as: ZOLOFT Take 1 tablet (100 mg total) by mouth 2 (two) times daily.   urea 20 % cream Commonly known as: CARMOL Apply 1 application  topically daily. Apply to both feet.        Diagnostic Studies: VAS [place] LOWER EXTREMITY VENOUS (DVT)  Result Date: 10/23/2021  Lower Venous DVT Study Patient Name:  [name] [facility]  Date of Exam:   10/23/2021 Medical Rec #: [id number]                  Accession #:    [id number] Date of Birth: 1980/09/15                 Patient Gender: F Patient Age:   41 years Exam Location:  [facility] Procedure:      VAS [place] LOWER EXTREMITY VENOUS (DVT) Referring Phys: Cherylann Ratel --------------------------------------------------------------------------------  Indications: Elevated Ddimer.  Risk Factors: None identified. Limitations: Poor ultrasound/tissue interface and patient positioning, patient immobility. Comparison Study: No prior studies. Performing Technologist: Belenda Cruise  Collins RVT  Examination Guidelines: A complete evaluation includes B-mode imaging, spectral Doppler, color Doppler, and power Doppler as needed of all accessible portions of each vessel. Bilateral testing is considered an integral part of a complete examination. Limited examinations for reoccurring indications may be performed as noted. The reflux portion of the exam is performed with the patient in reverse Trendelenburg.  +---------+---------------+---------+-----------+----------+--------------+ RIGHT    CompressibilityPhasicitySpontaneityPropertiesThrombus Aging +---------+---------------+---------+-----------+----------+--------------+ CFV      Full           Yes      Yes                                 +---------+---------------+---------+-----------+----------+--------------+ SFJ      Full                                                        +---------+---------------+---------+-----------+----------+--------------+ FV Prox  Full                                                        +---------+---------------+---------+-----------+----------+--------------+ FV Mid   Full                                                        +---------+---------------+---------+-----------+----------+--------------+ FV DistalFull                                                         +---------+---------------+---------+-----------+----------+--------------+ PFV      Full                                                        +---------+---------------+---------+-----------+----------+--------------+ POP      Full           Yes      Yes                                 +---------+---------------+---------+-----------+----------+--------------+ PTV      Full                                                        +---------+---------------+---------+-----------+----------+--------------+ PERO     Full                                                        +---------+---------------+---------+-----------+----------+--------------+   +---------+---------------+---------+-----------+----------+--------------+  LEFT     CompressibilityPhasicitySpontaneityPropertiesThrombus Aging +---------+---------------+---------+-----------+----------+--------------+ CFV      Full           Yes      Yes                                 +---------+---------------+---------+-----------+----------+--------------+ SFJ      Full                                                        +---------+---------------+---------+-----------+----------+--------------+ FV Prox  Full                                                        +---------+---------------+---------+-----------+----------+--------------+ FV Mid   Full                                                        +---------+---------------+---------+-----------+----------+--------------+ FV DistalFull                                                        +---------+---------------+---------+-----------+----------+--------------+ PFV      Full                                                        +---------+---------------+---------+-----------+----------+--------------+ POP      Full           Yes      Yes                                  +---------+---------------+---------+-----------+----------+--------------+ PTV      Full                                                        +---------+---------------+---------+-----------+----------+--------------+ PERO     Full                                                        +---------+---------------+---------+-----------+----------+--------------+     Summary: RIGHT: - There is no evidence of deep vein thrombosis in the lower extremity. However, portions of this examination were limited- see technologist comments above.  - No cystic structure found in the popliteal fossa.  LEFT: - There is no evidence  of deep vein thrombosis in the lower extremity. However, portions of this examination were limited- see technologist comments above.  - No cystic structure found in the popliteal fossa.  *See table(s) above for measurements and observations. Electronically signed by Monica Martinez MD on 10/23/2021 at 3:53:54 PM.    Final    DG CHEST PORT 1 VIEW  Result Date: 10/23/2021 CLINICAL DATA:  Postop fever. EXAM: PORTABLE CHEST 1 VIEW COMPARISON:  Aug 13, 2021. FINDINGS: The heart size and mediastinal contours are within normal limits. Both lungs are clear. No visible pleural effusions or pneumothorax. No acute osseous abnormality. IMPRESSION: No evidence of acute cardiopulmonary disease. Electronically Signed   By: Margaretha Sheffield M.D.   On: 10/23/2021 08:20   CT RENAL STONE STUDY  Result Date: 10/21/2021 CLINICAL DATA:  Nephrolithiasis. Status post right percutaneous nephrolithotomy EXAM: CT ABDOMEN AND PELVIS WITHOUT CONTRAST TECHNIQUE: Multidetector CT imaging of the abdomen and pelvis was performed following the standard protocol without IV contrast. RADIATION DOSE REDUCTION: This exam was performed according to the departmental dose-optimization program which includes automated exposure control, adjustment of the mA and/or kV according to patient size and/or use of iterative  reconstruction technique. COMPARISON:  08/14/2021 FINDINGS: Lower chest: Subsegmental atelectasis noted in the lung bases. Hepatobiliary: There is diffuse hepatic steatosis. No focal liver abnormality. Cholecystectomy. No bile duct dilatation. Pancreas: Unremarkable. No pancreatic ductal dilatation or surrounding inflammatory changes. Spleen: Spleen is enlarged measuring 14 cm in cranial caudal dimension. No focal splenic lesion Adrenals/Urinary Tract: Normal adrenal glands. There is a single stone within the upper pole collecting system of the left kidney measuring 2-3 mm, image 72/5. On the previous exam there was a stone within the left upper pole, lower pole and central renal pelvis. No signs of left-sided hydronephrosis. No left ureteral calculi identified. Right-sided large caliber percutaneous nephrostomy tube is in place with tip terminating in the inferior pole collecting system. There is also a right-sided percutaneous nephroureteral stent in place. There is a single stone identified within the upper pole collecting system of the left kidney measuring 4 mm, image 48/6 and image 41/2. On the previous exam there are multiple stones within the upper and lower pole collecting system. The largest on the previous exam was in the upper pole measuring 1 cm. Moderate right hydronephrosis and hydroureter identified. There is a small amount of gas within the right renal pelvis likely reflecting intervention. The patient is status post urinary diversion with right abdominal loop ileostomy. The tip of the right ureteral stent terminates just proximal to the ostomy site a, image 78/2. There are no stones identified along the course of the right ureter to the loop ileostomy. Decompressed urinary bladder is identified within the ventral pelvis. Stomach/Bowel: Stomach appears normal. No bowel wall thickening, inflammation, or distension. There is a moderate stool burden identified within the colon. Vascular/Lymphatic: No  significant vascular findings are present. No enlarged abdominal or pelvic lymph nodes. Reproductive: Uterus and bilateral adnexa are unremarkable. Other: No significant free fluid or fluid collections identified. No signs of pneumoperitoneum. Musculoskeletal: No acute osseous findings. Prominent muscular atrophy identified within the visualized portions of the lower extremities. IMPRESSION: 1. Status post right-sided percutaneous nephrostomy tube and right-sided percutaneous nephroureteral stent placement. 2. Moderate right hydronephrosis and hydroureter. No stones identified along the course of the right ureter to the loop ileostomy. 3. Bilateral nephrolithiasis. Compared with the previous exam there is a single stone remaining within the upper pole collecting system of the left kidney measuring  2-3 mm. A single stone within the upper pole collecting system of the right kidney remains measuring 4 mm. 4. Hepatic steatosis. 5. Splenomegaly. Electronically Signed   By: Kerby Moors M.D.   On: 10/21/2021 08:08   IR  NEPHROURETERAL CATH PLACE RIGHT  Result Date: 10/20/2021 CLINICAL DATA:  Symptomatic right nephrolithiasis, planned percutaneous nephrolithotomy EXAM: RIGHT PERCUTANEOUS NEPHROURETERAL CATHETER PLACEMENT UNDER FLUOROSCOPIC GUIDANCE FLUOROSCOPY: Radiation Exposure Index (as provided by the fluoroscopic device): 30 mGy air Kerma TECHNIQUE: The procedure, risks (including but not limited to bleeding, infection, organ damage ), benefits, and alternatives were explained to the patient. Questions regarding the procedure were encouraged and answered. The patient understands and consents to the procedure. Right flank region prepped with chlorhexidine, draped in usual sterile fashion, infiltrated locally with 1% lidocaine. Intravenous Fentanyl 17mg and Versed '3mg'$  were administered as conscious sedation during continuous monitoring of the patient's level of consciousness and physiological / cardiorespiratory  status by the radiology RN, with a total moderate sedation time of 30 minutes. Under real-time fluoroscopic guidance, a 21-gauge trocar needle was advanced into a posterior upper pole calyx using the peripheral radiodense calculus as a guide. Contrast injection demonstrated good positioning in the collecting system. A 018 guidewire advanced easily down the ureter. Needle was exchanged over a guidewire for transitional dilator. Catheter was exchanged over a guidewire for a 5 [place] catheter, advanced into the ileal conduit. Radiograph confirms appropriate nephroureteral catheter positioning. Catheter capped and secured externally. The patient tolerated the procedure well. COMPLICATIONS: COMPLICATIONS None. IMPRESSION: 1. Technically successful right antegrade percutaneous nephroureteral catheter placement. Electronically Signed   By: DLucrezia EuropeM.D.   On: 10/20/2021 17:51   DG C-Arm 1-60 Min-No Report  Result Date: 10/20/2021 Fluoroscopy was utilized by the requesting physician.  No radiographic interpretation.   DG C-Arm 1-60 Min-No Report  Result Date: 10/20/2021 Fluoroscopy was utilized by the requesting physician.  No radiographic interpretation.    Disposition: Discharge disposition: 03-Skilled Nursing Facility       Discharge Instructions     Discontinue IV   Complete by: As directed         Follow-up Information     DHollace Hayward NP Follow up on 10/31/2021.   Why: [id number] information: [address] [phone]                  Signed: JIrine Seal7/26/2023, 6:54 AM

## 2021-10-25 NOTE — TOC Transition Note (Addendum)
Transition of Care [facility]) - CM/SW Discharge Note   Patient Details  Name: [name] MRN: [id number] Date of Birth: December 02, 1980  [facility] CM/SW Contact:  Dessa Phi, RN Phone Number: 10/25/2021, 10:17 AM   Clinical Narrative:  d/c back to [facility] Tel#[phone] [place]. [facility] called. RM#[id number]. No further CM needs.    Final next level of care: Long Term Nursing Home Barriers to Discharge: No Barriers Identified   Patient Goals and CMS Choice Patient states their goals for this hospitalization and ongoing recovery are:: to return to [facility] Medicare.gov Compare Post Acute Care list provided to:: Patient Represenative (must comment) [name])    Discharge Placement              Patient chooses bed at: [facility] Patient to be transferred to facility by:  Corey Harold) Name of family member notified:  [name](mother)) Patient and family notified of of transfer: 10/25/21  Discharge Plan and Services   Discharge Planning Services: CM Consult Post Acute Care Choice: Nursing Home                               Social Determinants of Health (SDOH) Interventions     Readmission Risk Interventions     No data to display

## 2021-10-28 LAB — CULTURE, BLOOD (ROUTINE X 2)
Culture: NO GROWTH
Culture: NO GROWTH
Special Requests: ADEQUATE

## 2021-11-10 ENCOUNTER — Encounter: Payer: Self-pay | Admitting: Neurology

## 2021-11-13 NOTE — Telephone Encounter (Signed)
Called pateint with Dr,. Jaffe recommendations and told patient to contact PCP or GI doctor for those issues. Patient understood and is taking that advice

## 2021-11-15 ENCOUNTER — Encounter: Payer: Self-pay | Admitting: Neurology

## 2022-01-19 ENCOUNTER — Encounter: Payer: Self-pay | Admitting: Obstetrics & Gynecology

## 2022-01-22 ENCOUNTER — Telehealth: Payer: Self-pay | Admitting: Neurology

## 2022-01-22 NOTE — Telephone Encounter (Signed)
Called and arranged transportation but manager said we do not need to schedule her transportation

## 2022-01-22 NOTE — Telephone Encounter (Signed)
Pt is requesting a call back from sheena about transportation to appts

## 2022-01-27 ENCOUNTER — Encounter: Payer: Self-pay | Admitting: Neurology

## 2022-01-30 ENCOUNTER — Other Ambulatory Visit: Payer: Self-pay

## 2022-01-30 MED ORDER — GABAPENTIN 300 MG PO CAPS: ORAL_CAPSULE | ORAL | 2 refills | Status: AC

## 2022-01-30 NOTE — Progress Notes (Signed)
Per Dr.Jaffe, Please send prescription for gabapentin '900mg'$  three times daily to her pharmacy

## 2022-02-11 ENCOUNTER — Encounter: Payer: Self-pay | Admitting: Neurology

## 2022-03-16 ENCOUNTER — Encounter: Payer: Self-pay | Admitting: Family Medicine

## 2022-03-16 ENCOUNTER — Ambulatory Visit (INDEPENDENT_AMBULATORY_CARE_PROVIDER_SITE_OTHER): Payer: Medicaid Other | Admitting: Family Medicine

## 2022-03-16 ENCOUNTER — Ambulatory Visit: Payer: Medicaid Other | Admitting: Obstetrics & Gynecology

## 2022-03-16 VITALS — BP 125/88 | HR 89

## 2022-03-16 DIAGNOSIS — Z1231 Encounter for screening mammogram for malignant neoplasm of breast: Secondary | ICD-10-CM

## 2022-03-16 DIAGNOSIS — R232 Flushing: Secondary | ICD-10-CM | POA: Diagnosis not present

## 2022-03-16 NOTE — Progress Notes (Signed)
   Subjective:    Patient ID: [name] is a 41 y.o. female presenting with Gynecologic Exam  on 03/16/2022  HPI: Having hot flashes. Having them daily, lasts 12 hours.  Review of Systems  Constitutional:  Negative for chills and fever.  Respiratory:  Negative for shortness of breath.   Cardiovascular:  Negative for chest pain.  Gastrointestinal:  Negative for abdominal pain, nausea and vomiting.  Genitourinary:  Negative for dysuria.  Skin:  Negative for rash.      Objective:    BP 125/88   Pulse 89   LMP 03/15/2022 (Approximate)  Physical Exam Exam conducted with a chaperone present.  Constitutional:      General: She is not in acute distress.    Appearance: She is well-developed.  HENT:     Head: Normocephalic and atraumatic.  Eyes:     General: No scleral icterus. Cardiovascular:     Rate and Rhythm: Normal rate.  Pulmonary:     Effort: Pulmonary effort is normal.  Abdominal:     Palpations: Abdomen is soft.  Musculoskeletal:     Cervical back: Neck supple.  Skin:    General: Skin is warm and dry.  Neurological:     Mental Status: She is alert and oriented to person, place, and time.         Assessment & Plan:   Total time in review of prior notes, pathology, labs, history taking, review with patient, exam, note writing, discussion of options, plan for next steps, alternatives and risks of treatment: *** minutes.  No follow-ups on file.  Donnamae Jude, MD 03/16/2022 11:10 AM

## 2022-03-20 ENCOUNTER — Telehealth: Payer: Self-pay

## 2022-03-20 DIAGNOSIS — Z1239 Encounter for other screening for malignant neoplasm of breast: Secondary | ICD-10-CM

## 2022-03-23 NOTE — Telephone Encounter (Addendum)
Mammogram scheduled Per [facility] radiologist Shon Hale, MD: She said there are a few patients they will do screening breast US on if they cannot do a mammogram but would still want treatment for breast cancer if found. She said they can see patients on a stretcher it is just rare. Orders placed and breast center called to schedule. Will notify patient by MyChart.

## 2022-04-08 ENCOUNTER — Encounter: Payer: Self-pay | Admitting: Family Medicine

## 2022-04-10 ENCOUNTER — Encounter: Payer: Self-pay | Admitting: General Practice

## 2022-05-08 ENCOUNTER — Other Ambulatory Visit: Payer: Medicaid Other

## 2022-05-08 ENCOUNTER — Ambulatory Visit
Admission: RE | Admit: 2022-05-08 | Discharge: 2022-05-08 | Disposition: A | Discharge status: Home / Self Care | Payer: Medicaid Other | Source: Ambulatory Visit | Attending: Family Medicine | Admitting: Family Medicine

## 2022-05-08 DIAGNOSIS — Z1239 Encounter for other screening for malignant neoplasm of breast: Secondary | ICD-10-CM

## 2022-05-14 ENCOUNTER — Other Ambulatory Visit: Payer: Self-pay | Admitting: Family Medicine

## 2022-05-14 DIAGNOSIS — N6310 Unspecified lump in the right breast, unspecified quadrant: Secondary | ICD-10-CM

## 2022-05-16 ENCOUNTER — Ambulatory Visit: Payer: Medicaid Other

## 2022-05-16 NOTE — Progress Notes (Unsigned)
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  Gabapentin 923m three times daily and baclofen 218mfour times daily 2.  Follow up in one year.  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   UPDATE: Medications: gabapentin 80085mhree times daily; diazepam 5mg75mice daily, baclofen 20mg20mee times daily, lamotrigine 50mg,105mtraline100mg t51m daily    She has not gotten the baclofen pump.  Overall, pain is controlled but does have spasms.   HISTORY: In 2005, she developed acute transverse myelitis.  She woke up with severe neck and back pain with weakness in the arms and legs, as well as bowel and bladder retention.  MRI of cervical spine showed abnormal signal abnormality and cord swelling from C4 through C7 with abnormal enhancement at the C4-5 level.  MRI of thoracic spine showed upper thoracic cord involvement at TI and T2.  She underwent lumbar puncture and was treated with 3 days of IV Solu-Medrol, followed by oral steroids.     She is paraplegic, unable to move below the waist.  She has both bowel and urinary retention.  She has right greater than left upper extremity weakness.   She has a urostomy pouch and requires catheterizations.  She uses stool softeners for constipation.  She takes Diazepam and Baclofen for muscle spasms, as well as gabapentin.  She has chronic sacral decubitus ulcer for which she is followed by wound care.  She reports daytime somnolence.  She sees psychiatry for depression and PTSD.   A follow up MRI of the neuroaxis with and without contrast was performed on 03/24/15, which was personally reviewed.  The brain showed slight cerebral volume loss but no demyelinating lesions.  The cervical and thoracic spinal cord showed  atrophy extending from C6 to T2, greatest at C7-T1.  There is a left central disc extrusion at C5-6 resulting in cord flattening with new T2 signal at this level but no abnormal enhancement.  PAST MEDICAL HISTORY: Past Medical History:  Diagnosis Date   Bladder stones    Hx of   Depression    GERD (gastroesophageal reflux disease)    Intertrigo 04/24/2017   Left upper extremity swelling 04/24/2017   Multiple sclerosis (HCC)    Paraplegia (HCC)    Polycystic disease, ovaries    PTSD (post-traumatic stress disorder)    Transverse myelitis (HCC)   Guionast infection 09/26/2017    MEDICATIONS: Current Outpatient Medications on File Prior to Visit  Medication Sig Dispense Refill   acetaminophen (TYLENOL) 325 MG tablet Take 650 mg by mouth every 4 (four) hours as needed for headache, mild pain or fever.     acetaminophen-codeine (TYLENOL #3) 300-30 MG tablet Take by mouth every 6 (six) hours as needed for moderate pain or severe pain. DO NOT EXCEED 4000 MG OF TYLENOL IN A DAY     baclofen (LIORESAL) 20 MG tablet Take 1 tablet (20 mg total) by mouth 4 (four) times daily. 112 tablet 5   diazepam (VALIUM) 5 MG tablet Take 1-2 tablets (5-10 mg total) by mouth 2 (two) times daily. Take 1 tablet (5 mg) BID and Take 2 tablets (10 mg) at bedtime (Patient taking differently: Take 5-10 mg by mouth See admin instructions. Take 5 mg by mouth in the morning and 10 mg at bedtime) 14  tablet 0   fluticasone (FLONASE) 50 MCG/ACT nasal spray Place 1 spray into the nose daily.     gabapentin (NEURONTIN) 300 MG capsule Take 3 caps ( 900 mg) Three times a day 270 capsule 2   LACTOBACILLUS PO Take 1 capsule by mouth daily.     lamoTRIgine (LAMICTAL) 25 MG tablet Take 2 tablets (50 mg total) by mouth daily. 60 tablet 0   loratadine (CLARITIN) 10 MG tablet Take 10 mg by mouth daily.     menthol-cetylpyridinium (CEPACOL) 3 MG lozenge Take 1 lozenge by mouth every 4 (four) hours as needed for sore throat. (Patient not  taking: Reported on 03/16/2022)     Multiple Vitamins-Minerals (MULTIVITAMIN WITH MINERALS) tablet Take 1 tablet by mouth daily.     nystatin cream (MYCOSTATIN) Apply 1 application  topically at bedtime as needed (vaginal). Apply to vaginal area (Patient not taking: Reported on 03/16/2022)     OLANZapine (ZYPREXA) 2.5 MG tablet Take 2.5 mg by mouth daily.     omeprazole (PRILOSEC) 20 MG capsule Take 20 mg by mouth daily.     ondansetron (ZOFRAN) 4 MG tablet Take 4 mg by mouth every 8 (eight) hours as needed for nausea or vomiting.     sertraline (ZOLOFT) 100 MG tablet Take 1 tablet (100 mg total) by mouth 2 (two) times daily. 60 tablet 0   urea (CARMOL) 20 % cream Apply 1 application  topically daily. Apply to both feet. (Patient not taking: Reported on 03/16/2022)     No current facility-administered medications on file prior to visit.    ALLERGIES: Allergies  Allergen Reactions   Iodine Anaphylaxis   Latex Anaphylaxis   Other Other (See Comments)    Documented on Order Summary Report - unknown reaction Apricot, collard greens, green bean, melon, peaches, turnip greens   Polymyxin B Hives    Pt had hives in right arm with infusion of polymyxin on November 26th, 2018   Shellfish-Derived Products Anaphylaxis    All seafood   Vancomycin Anaphylaxis   Iodinated Contrast Media Hives    Patient requires 13 hour  prep prior to administration   Strawberry Extract Hives and Rash    FAMILY HISTORY: Family History  Problem Relation Age of Onset   Diabetes type II Father    CAD Father    Dementia Father    Stroke Father    Seizures Father    Diabetes type II Mother    Hypertension Mother    Breast cancer Other    Diabetes type II Other       Objective:  Blood pressure 118/70, pulse 77, height 5' 3"$  (1.6 m), weight 185 lb (83.9 kg), SpO2 93 %. General: No acute distress.  Patient appears well-groomed.   Head:  Normocephalic/atraumatic Eyes:  Fundi examined but not  visualized Neck: supple, no paraspinal tenderness, full range of motion Heart:  Regular rate and rhythm Neurological Exam: alert and oriented to person, place, and time.  Speech fluent and not dysarthric, language intact.  CN II-XII intact. Decreased bulk in left upper extremity and bilateral lower extremities, atrophy in both hands; increased tone in left upper and bilateral lower extremities; muscle strength 4/5 left deltoid, 3+/5 left hand grip, 0/5 lower extremities, otherwise 5/5; Sensation to light touch reduced in lower extremities; deep tendon reflexes 3+ throughout, slightly more brisk on left.  Nonambulatory.  Metta Clines, DO

## 2022-05-17 ENCOUNTER — Encounter: Payer: Self-pay | Admitting: Neurology

## 2022-05-17 ENCOUNTER — Ambulatory Visit (INDEPENDENT_AMBULATORY_CARE_PROVIDER_SITE_OTHER): Payer: Medicaid Other | Admitting: Neurology

## 2022-05-17 VITALS — BP 118/70 | HR 77 | Ht 63.0 in | Wt 185.0 lb

## 2022-05-17 DIAGNOSIS — G8220 Paraplegia, unspecified: Secondary | ICD-10-CM | POA: Diagnosis not present

## 2022-05-17 DIAGNOSIS — G373 Acute transverse myelitis in demyelinating disease of central nervous system: Secondary | ICD-10-CM

## 2022-05-22 ENCOUNTER — Ambulatory Visit
Admission: RE | Admit: 2022-05-22 | Discharge: 2022-05-22 | Disposition: A | Discharge status: Home / Self Care | Payer: Medicaid Other | Source: Ambulatory Visit | Attending: Family Medicine | Admitting: Family Medicine

## 2022-05-22 DIAGNOSIS — N6310 Unspecified lump in the right breast, unspecified quadrant: Secondary | ICD-10-CM

## 2022-05-22 HISTORY — PX: BREAST BIOPSY: SHX20

## 2022-06-02 ENCOUNTER — Emergency Department (HOSPITAL_COMMUNITY): Payer: Medicaid Other

## 2022-06-02 ENCOUNTER — Encounter (HOSPITAL_COMMUNITY): Payer: Self-pay | Admitting: Emergency Medicine

## 2022-06-02 ENCOUNTER — Other Ambulatory Visit: Payer: Self-pay

## 2022-06-02 ENCOUNTER — Emergency Department (HOSPITAL_COMMUNITY)
Admission: EM | Admit: 2022-06-02 | Discharge: 2022-06-02 | Disposition: A | Discharge status: Home / Self Care | Payer: Medicaid Other | Attending: Emergency Medicine | Admitting: Emergency Medicine

## 2022-06-02 DIAGNOSIS — M546 Pain in thoracic spine: Secondary | ICD-10-CM | POA: Insufficient documentation

## 2022-06-02 DIAGNOSIS — M545 Low back pain, unspecified: Secondary | ICD-10-CM

## 2022-06-02 DIAGNOSIS — R110 Nausea: Secondary | ICD-10-CM | POA: Insufficient documentation

## 2022-06-02 DIAGNOSIS — R109 Unspecified abdominal pain: Secondary | ICD-10-CM | POA: Diagnosis not present

## 2022-06-02 DIAGNOSIS — Z91040 Latex allergy status: Secondary | ICD-10-CM | POA: Insufficient documentation

## 2022-06-02 LAB — URINALYSIS, ROUTINE W REFLEX MICROSCOPIC
Bilirubin Urine: NEGATIVE
Glucose, UA: NEGATIVE mg/dL
Hgb urine dipstick: NEGATIVE
Ketones, ur: NEGATIVE mg/dL
Nitrite: POSITIVE — AB
Protein, ur: 30 mg/dL — AB
Specific Gravity, Urine: 1.01 (ref 1.005–1.030)
pH: 8.5 — ABNORMAL HIGH (ref 5.0–8.0)

## 2022-06-02 LAB — PREGNANCY, URINE: Preg Test, Ur: NEGATIVE

## 2022-06-02 LAB — URINALYSIS, MICROSCOPIC (REFLEX)
RBC / HPF: NONE SEEN RBC/hpf (ref 0–5)
Squamous Epithelial / HPF: NONE SEEN /HPF (ref 0–5)

## 2022-06-02 MED ORDER — OXYCODONE-ACETAMINOPHEN 5-325 MG PO TABS
1.0000 | ORAL_TABLET | Freq: Once | ORAL | Status: AC
  Administered 2022-06-02: 1 via ORAL
  Filled 2022-06-02: qty 1

## 2022-06-02 MED ORDER — ONDANSETRON 4 MG PO TBDP: 4.0000 mg | ORAL_TABLET | Freq: Three times a day (TID) | ORAL | 0 refills | Status: DC | PRN

## 2022-06-02 MED ORDER — ONDANSETRON 4 MG PO TBDP
4.0000 mg | ORAL_TABLET | Freq: Once | ORAL | Status: AC
  Administered 2022-06-02: 4 mg via ORAL
  Filled 2022-06-02: qty 1
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
## 2022-06-02 NOTE — Discharge Instructions (Addendum)
Continue to monitor your symptoms return immediately to the emergency room for any fevers vomiting or any other new or concerning symptoms. Make sure you are hydrating well.  Take your prescribed medications including muscle relaxers.

## 2022-06-02 NOTE — ED Notes (Signed)
RN attempted to reach [facility] RN for report.

## 2022-06-06 ENCOUNTER — Encounter (HOSPITAL_COMMUNITY): Payer: Self-pay | Admitting: Emergency Medicine

## 2022-06-06 ENCOUNTER — Other Ambulatory Visit: Payer: Self-pay

## 2022-06-06 ENCOUNTER — Inpatient Hospital Stay (HOSPITAL_COMMUNITY)
Admission: EM | Admit: 2022-06-06 | Discharge: 2022-06-13 | DRG: 193 | Disposition: A | Discharge status: Skilled Nursing Facility | Payer: Medicaid Other | Source: Skilled Nursing Facility | Attending: Internal Medicine | Admitting: Internal Medicine

## 2022-06-06 ENCOUNTER — Emergency Department (HOSPITAL_COMMUNITY): Payer: Medicaid Other

## 2022-06-06 DIAGNOSIS — B338 Other specified viral diseases: Secondary | ICD-10-CM | POA: Diagnosis present

## 2022-06-06 DIAGNOSIS — E876 Hypokalemia: Secondary | ICD-10-CM | POA: Diagnosis not present

## 2022-06-06 DIAGNOSIS — Z87892 Personal history of anaphylaxis: Secondary | ICD-10-CM

## 2022-06-06 DIAGNOSIS — Z789 Other specified health status: Secondary | ICD-10-CM

## 2022-06-06 DIAGNOSIS — F4310 Post-traumatic stress disorder, unspecified: Secondary | ICD-10-CM | POA: Diagnosis present

## 2022-06-06 DIAGNOSIS — F32A Depression, unspecified: Secondary | ICD-10-CM | POA: Diagnosis present

## 2022-06-06 DIAGNOSIS — E669 Obesity, unspecified: Secondary | ICD-10-CM | POA: Diagnosis present

## 2022-06-06 DIAGNOSIS — Z91018 Allergy to other foods: Secondary | ICD-10-CM

## 2022-06-06 DIAGNOSIS — J9601 Acute respiratory failure with hypoxia: Secondary | ICD-10-CM | POA: Insufficient documentation

## 2022-06-06 DIAGNOSIS — E282 Polycystic ovarian syndrome: Secondary | ICD-10-CM | POA: Diagnosis present

## 2022-06-06 DIAGNOSIS — G35D Multiple sclerosis, unspecified: Secondary | ICD-10-CM | POA: Diagnosis present

## 2022-06-06 DIAGNOSIS — J210 Acute bronchiolitis due to respiratory syncytial virus: Principal | ICD-10-CM | POA: Diagnosis present

## 2022-06-06 DIAGNOSIS — J121 Respiratory syncytial virus pneumonia: Principal | ICD-10-CM | POA: Diagnosis present

## 2022-06-06 DIAGNOSIS — G8220 Paraplegia, unspecified: Secondary | ICD-10-CM | POA: Diagnosis present

## 2022-06-06 DIAGNOSIS — K219 Gastro-esophageal reflux disease without esophagitis: Secondary | ICD-10-CM | POA: Diagnosis present

## 2022-06-06 DIAGNOSIS — Z818 Family history of other mental and behavioral disorders: Secondary | ICD-10-CM

## 2022-06-06 DIAGNOSIS — Z1152 Encounter for screening for COVID-19: Secondary | ICD-10-CM

## 2022-06-06 DIAGNOSIS — Z881 Allergy status to other antibiotic agents status: Secondary | ICD-10-CM

## 2022-06-06 DIAGNOSIS — Z91013 Allergy to seafood: Secondary | ICD-10-CM

## 2022-06-06 DIAGNOSIS — Z9049 Acquired absence of other specified parts of digestive tract: Secondary | ICD-10-CM

## 2022-06-06 DIAGNOSIS — Z87440 Personal history of urinary (tract) infections: Secondary | ICD-10-CM

## 2022-06-06 DIAGNOSIS — Z803 Family history of malignant neoplasm of breast: Secondary | ICD-10-CM

## 2022-06-06 DIAGNOSIS — J209 Acute bronchitis, unspecified: Secondary | ICD-10-CM | POA: Diagnosis present

## 2022-06-06 DIAGNOSIS — Z7951 Long term (current) use of inhaled steroids: Secondary | ICD-10-CM

## 2022-06-06 DIAGNOSIS — L89156 Pressure-induced deep tissue damage of sacral region: Secondary | ICD-10-CM | POA: Diagnosis present

## 2022-06-06 DIAGNOSIS — Z936 Other artificial openings of urinary tract status: Secondary | ICD-10-CM

## 2022-06-06 DIAGNOSIS — L89626 Pressure-induced deep tissue damage of left heel: Secondary | ICD-10-CM | POA: Diagnosis present

## 2022-06-06 DIAGNOSIS — Z87442 Personal history of urinary calculi: Secondary | ICD-10-CM

## 2022-06-06 DIAGNOSIS — Z833 Family history of diabetes mellitus: Secondary | ICD-10-CM

## 2022-06-06 DIAGNOSIS — D509 Iron deficiency anemia, unspecified: Secondary | ICD-10-CM | POA: Diagnosis present

## 2022-06-06 DIAGNOSIS — Z91041 Radiographic dye allergy status: Secondary | ICD-10-CM

## 2022-06-06 DIAGNOSIS — Z7401 Bed confinement status: Secondary | ICD-10-CM

## 2022-06-06 DIAGNOSIS — K5900 Constipation, unspecified: Secondary | ICD-10-CM | POA: Diagnosis not present

## 2022-06-06 DIAGNOSIS — Z8249 Family history of ischemic heart disease and other diseases of the circulatory system: Secondary | ICD-10-CM

## 2022-06-06 DIAGNOSIS — Z886 Allergy status to analgesic agent status: Secondary | ICD-10-CM

## 2022-06-06 DIAGNOSIS — R748 Abnormal levels of other serum enzymes: Secondary | ICD-10-CM | POA: Diagnosis present

## 2022-06-06 DIAGNOSIS — Z6833 Body mass index (BMI) 33.0-33.9, adult: Secondary | ICD-10-CM

## 2022-06-06 DIAGNOSIS — R0902 Hypoxemia: Secondary | ICD-10-CM

## 2022-06-06 DIAGNOSIS — Z823 Family history of stroke: Secondary | ICD-10-CM

## 2022-06-06 DIAGNOSIS — G35 Multiple sclerosis: Secondary | ICD-10-CM | POA: Diagnosis present

## 2022-06-06 DIAGNOSIS — Z91040 Latex allergy status: Secondary | ICD-10-CM

## 2022-06-06 DIAGNOSIS — Z79899 Other long term (current) drug therapy: Secondary | ICD-10-CM

## 2022-06-06 LAB — CBC WITH DIFFERENTIAL/PLATELET
Abs Immature Granulocytes: 0.03 10*3/uL (ref 0.00–0.07)
Basophils Absolute: 0 10*3/uL (ref 0.0–0.1)
Basophils Relative: 0 %
Eosinophils Absolute: 0 10*3/uL (ref 0.0–0.5)
Eosinophils Relative: 0 %
HCT: 33 % — ABNORMAL LOW (ref 36.0–46.0)
Hemoglobin: 9.7 g/dL — ABNORMAL LOW (ref 12.0–15.0)
Immature Granulocytes: 0 %
Lymphocytes Relative: 3 %
Lymphs Abs: 0.3 10*3/uL — ABNORMAL LOW (ref 0.7–4.0)
MCH: 22.3 pg — ABNORMAL LOW (ref 26.0–34.0)
MCHC: 29.4 g/dL — ABNORMAL LOW (ref 30.0–36.0)
MCV: 75.9 fL — ABNORMAL LOW (ref 80.0–100.0)
Monocytes Absolute: 0.4 10*3/uL (ref 0.1–1.0)
Monocytes Relative: 5 %
Neutro Abs: 6.9 10*3/uL (ref 1.7–7.7)
Neutrophils Relative %: 92 %
Platelets: 232 10*3/uL (ref 150–400)
RBC: 4.35 MIL/uL (ref 3.87–5.11)
RDW: 18.1 % — ABNORMAL HIGH (ref 11.5–15.5)
WBC: 7.6 10*3/uL (ref 4.0–10.5)
nRBC: 0 % (ref 0.0–0.2)

## 2022-06-06 LAB — PROTIME-INR
INR: 1.1 (ref 0.8–1.2)
Prothrombin Time: 14.1 seconds (ref 11.4–15.2)

## 2022-06-06 LAB — COMPREHENSIVE METABOLIC PANEL
ALT: 61 U/L — ABNORMAL HIGH (ref 0–44)
AST: 84 U/L — ABNORMAL HIGH (ref 15–41)
Albumin: 4.3 g/dL (ref 3.5–5.0)
Alkaline Phosphatase: 83 U/L (ref 38–126)
Anion gap: 8 (ref 5–15)
BUN: 10 mg/dL (ref 6–20)
CO2: 25 mmol/L (ref 22–32)
Calcium: 8.6 mg/dL — ABNORMAL LOW (ref 8.9–10.3)
Chloride: 101 mmol/L (ref 98–111)
Creatinine, Ser: 0.48 mg/dL (ref 0.44–1.00)
GFR, Estimated: >60 mL/min (ref >60)
Glucose, Bld: 148 mg/dL — ABNORMAL HIGH (ref 70–99)
Potassium: 3.7 mmol/L (ref 3.5–5.1)
Sodium: 134 mmol/L — ABNORMAL LOW (ref 135–145)
Total Bilirubin: 0.7 mg/dL (ref 0.3–1.2)
Total Protein: 7.6 g/dL (ref 6.5–8.1)

## 2022-06-06 NOTE — ED Triage Notes (Signed)
Pt to ED via [facility] from [facility] c/o fever today and productive cough x2 days.  Fever at facility was 101, given '650mg'$  tylenol at 2130.  Per EMS pt had chest xray done around noon today that read clear, rhonchi throughout, also has urostomy bag that was changed out today but reports did miss changing it last week.  EMS vitals 116 HR, 93% RA, 110/60 BP, 26 RR, 177 CBG.  Pt is A&Ox4, reports chronic back pain, denies SOB, does have bilat heal wounds that have been changed and bandaged at facility today.[name]

## 2022-06-06 NOTE — ED Provider Notes (Signed)
[facility] Provider Note   CSN: [id number] Arrival date & time: 06/06/22  2249     History {Add pertinent medical, surgical, social history, OB history to HPI:1} Chief Complaint  Patient presents with   Fever   Cough    [name] is a 42 y.o. female.  42 year old female with past medical history of MS, paraplegia, transverse myelitis with additional history as reviewed in chart brought in by EMS from facility with concern for fever, cough, difficulty breathing.  Patient states that several of the residents in her facility have RSV, suspects that she also has this but has not been tested.  Arrives with O2 sat of 86% on room air is placed on 2 L nasal cannula with improvement.  Facility reports fever of 101, given 650 mg of Tylenol at 930 tonight.  Patient had a chest x-ray done earlier today and was told it was normal.  Has a right lower urostomy bag which was completely changed today however not changed last week per schedule.  Notes she has wounds to bilateral heels, dressings were changed today.  She was little nauseous today without vomiting, no changes in bowel or bladder output, good appetite.       Home Medications Prior to Admission medications   Medication Sig Start Date End Date Taking? Authorizing Provider  acetaminophen (TYLENOL) 325 MG tablet Take 650 mg by mouth every 4 (four) hours as needed for headache, mild pain or fever.    [provider]  acetaminophen-codeine (TYLENOL #3) 300-30 MG tablet Take by mouth every 6 (six) hours as needed for moderate pain or severe pain. DO NOT EXCEED 4000 MG OF TYLENOL IN A DAY    [provider]  baclofen (LIORESAL) 20 MG tablet Take 1 tablet (20 mg total) by mouth 4 (four) times daily. 02/13/19   Pieter Partridge, DO  diazepam (VALIUM) 5 MG tablet Take 1-2 tablets (5-10 mg total) by mouth 2 (two) times daily. Take 1 tablet (5 mg) BID and Take 2 tablets (10 mg)  at bedtime Patient taking differently: Take 5-10 mg by mouth See admin instructions. Take 5 mg by mouth in the morning and 10 mg at bedtime 03/12/18   Aline August, MD  fluticasone (FLONASE) 50 MCG/ACT nasal spray Place 1 spray into the nose daily.    [provider]  gabapentin (NEURONTIN) 300 MG capsule Take 3 caps ( 900 mg) Three times a day 01/30/22   Metta Clines R, DO  LACTOBACILLUS PO Take 1 capsule by mouth daily.    [provider]  lamoTRIgine (LAMICTAL) 25 MG tablet Take 2 tablets (50 mg total) by mouth daily. 04/03/17   Bonnielee Haff, MD  loratadine (CLARITIN) 10 MG tablet Take 10 mg by mouth daily.    [provider]  menthol-cetylpyridinium (CEPACOL) 3 MG lozenge Take 1 lozenge by mouth every 4 (four) hours as needed for sore throat.    [provider]  Multiple Vitamins-Minerals (MULTIVITAMIN WITH MINERALS) tablet Take 1 tablet by mouth daily.    [provider]  nystatin cream (MYCOSTATIN) Apply 1 application  topically at bedtime as needed (vaginal). Apply to vaginal area    [provider]  OLANZapine (ZYPREXA) 2.5 MG tablet Take 2.5 mg by mouth daily.    [provider]  omeprazole (PRILOSEC) 20 MG capsule Take 20 mg by mouth daily.    [provider]  ondansetron (ZOFRAN) 4 MG tablet Take 4 mg  by mouth every 8 (eight) hours as needed for nausea or vomiting.    [provider]  ondansetron (ZOFRAN-ODT) 4 MG disintegrating tablet Take 1 tablet (4 mg total) by mouth every 8 (eight) hours as needed for nausea or vomiting. 06/02/22   Tedd Sias, PA  sertraline (ZOLOFT) 100 MG tablet Take 1 tablet (100 mg total) by mouth 2 (two) times daily. 04/03/17   Bonnielee Haff, MD  urea (CARMOL) 20 % cream Apply 1 application  topically daily. Apply to both feet.    [provider]      Allergies    Iodine, Latex, Other, Polymyxin b, Shellfish-derived products, Vancomycin, Iodinated contrast media,  and Strawberry extract    Review of Systems   Review of Systems Negative except as per HPI Physical Exam Updated Vital Signs BP 126/65 (BP Location: Right Arm)   Pulse (!) 111   Temp 99.4 F (37.4 C) (Oral)   Resp 20   Ht '5\' 3"'$  (1.6 m)   Wt 85.3 kg   LMP 05/09/2022 (Approximate)   SpO2 93%   BMI 33.30 kg/m  Physical Exam Vitals and nursing note reviewed.  Constitutional:      General: She is not in acute distress.    Appearance: She is well-developed. She is not diaphoretic.  HENT:     Head: Normocephalic and atraumatic.     Mouth/Throat:     Mouth: Mucous membranes are dry.  Eyes:     Conjunctiva/sclera: Conjunctivae normal.  Cardiovascular:     Rate and Rhythm: Regular rhythm. Tachycardia present.     Pulses: Normal pulses.  Pulmonary:     Effort: Pulmonary effort is normal.     Breath sounds: Rhonchi present.  Abdominal:     Palpations: Abdomen is soft.     Tenderness: There is no abdominal tenderness.     Comments: Urostomy right lower abdomen  Musculoskeletal:     Cervical back: Neck supple.     Right lower leg: No edema.     Left lower leg: No edema.  Skin:    General: Skin is warm and dry.     Findings: No erythema or rash.  Neurological:     Mental Status: She is alert and oriented to person, place, and time.  Psychiatric:        Behavior: Behavior normal.     ED Results / Procedures / Treatments   Labs (all labs ordered are listed, but only abnormal results are displayed) Labs Reviewed  CBC WITH DIFFERENTIAL/PLATELET - Abnormal; Notable for the following components:      Result Value   Hemoglobin 9.7 (*)    HCT 33.0 (*)    MCV 75.9 (*)    MCH 22.3 (*)    MCHC 29.4 (*)    RDW 18.1 (*)    Lymphs Abs 0.3 (*)    All other components within normal limits  CULTURE, BLOOD (ROUTINE X 2)  CULTURE, BLOOD (ROUTINE X 2)  COMPREHENSIVE METABOLIC PANEL  LACTIC ACID, PLASMA  LACTIC ACID, PLASMA  PROTIME-INR  URINALYSIS, ROUTINE W REFLEX MICROSCOPIC   HCG, QUANTITATIVE, PREGNANCY    EKG EKG Interpretation  Date/Time:  Wednesday June 06 2022 23:02:53 EST Ventricular Rate:  112 PR Interval:  145 QRS Duration: 79 QT Interval:  310 QTC Calculation: 424 R Axis:   42 Text Interpretation: Sinus tachycardia Probable left atrial enlargement No significant change was found Confirmed by Ezequiel Essex [phone]) on 06/06/2022 11:29:18 PM  Radiology No results found.  Procedures Procedures  {Document cardiac monitor, telemetry assessment procedure when appropriate:1}  Medications Ordered in ED Medications - No data to display  ED Course/ Medical Decision Making/ A&P   {   Click here for ABCD2, HEART and other calculatorsREFRESH Note before signing :1}                          Medical Decision Making Amount and/or Complexity of Data Reviewed Labs: ordered. Radiology: ordered.   This patient presents to the ED for concern of fever, cough, this involves an extensive number of treatment options, and is a complaint that carries with it a high risk of complications and morbidity.  The differential diagnosis includes but not limited to RSV, COVID, flu, PNA, sepsis   Co morbidities that complicate the patient evaluation  MS, paraplegia, DVT, additional history as reviewed in chart   Additional history obtained:  Additional history obtained from EMS who contributes to history as above External records from outside source obtained and reviewed including ER visit dated 06/02/2022 with concern for back pain with history of kidney stones and UTI CT negative for renal stones discharged back to facility with Zofran.   Lab Tests:  I Ordered, and personally interpreted labs.  The pertinent results include:  ***   Imaging Studies ordered:  I ordered imaging studies including ***  I independently visualized and interpreted imaging which showed *** I agree with the radiologist interpretation   Cardiac Monitoring: / EKG:  The patient  was maintained on a cardiac monitor.  I personally viewed and interpreted the cardiac monitored which showed an underlying rhythm of: ***   Consultations Obtained:  I requested consultation with the ***,  and discussed lab and imaging findings as well as pertinent plan - they recommend: ***   Problem List / ED Course / Critical interventions / Medication management  *** I ordered medication including ***  for ***  Reevaluation of the patient after these medicines showed that the patient {resolved/improved/worsened:23923::"improved"} I have reviewed the patients home medicines and have made adjustments as needed   Social Determinants of Health:  ***   Test / Admission - Considered:  ***   {Document critical care time when appropriate:1} {Document review of labs and clinical decision tools ie heart score, Chads2Vasc2 etc:1}  {Document your independent review of radiology images, and any outside records:1} {Document your discussion with family members, caretakers, and with consultants:1} {Document social determinants of health affecting pt's care:1} {Document your decision making why or why not admission, treatments were needed:1} Final Clinical Impression(s) / ED Diagnoses Final diagnoses:  None    Rx / DC Orders ED Discharge Orders     None

## 2022-06-07 DIAGNOSIS — K219 Gastro-esophageal reflux disease without esophagitis: Secondary | ICD-10-CM | POA: Diagnosis present

## 2022-06-07 DIAGNOSIS — G8220 Paraplegia, unspecified: Secondary | ICD-10-CM | POA: Diagnosis present

## 2022-06-07 DIAGNOSIS — L89156 Pressure-induced deep tissue damage of sacral region: Secondary | ICD-10-CM | POA: Diagnosis present

## 2022-06-07 DIAGNOSIS — J210 Acute bronchiolitis due to respiratory syncytial virus: Secondary | ICD-10-CM | POA: Diagnosis not present

## 2022-06-07 DIAGNOSIS — K5900 Constipation, unspecified: Secondary | ICD-10-CM | POA: Diagnosis not present

## 2022-06-07 DIAGNOSIS — Z79899 Other long term (current) drug therapy: Secondary | ICD-10-CM | POA: Diagnosis not present

## 2022-06-07 DIAGNOSIS — R748 Abnormal levels of other serum enzymes: Secondary | ICD-10-CM | POA: Diagnosis present

## 2022-06-07 DIAGNOSIS — L89626 Pressure-induced deep tissue damage of left heel: Secondary | ICD-10-CM | POA: Diagnosis present

## 2022-06-07 DIAGNOSIS — J9601 Acute respiratory failure with hypoxia: Secondary | ICD-10-CM | POA: Diagnosis present

## 2022-06-07 DIAGNOSIS — B338 Other specified viral diseases: Secondary | ICD-10-CM | POA: Diagnosis present

## 2022-06-07 DIAGNOSIS — E282 Polycystic ovarian syndrome: Secondary | ICD-10-CM | POA: Diagnosis present

## 2022-06-07 DIAGNOSIS — E669 Obesity, unspecified: Secondary | ICD-10-CM | POA: Diagnosis present

## 2022-06-07 DIAGNOSIS — Z7401 Bed confinement status: Secondary | ICD-10-CM | POA: Diagnosis not present

## 2022-06-07 DIAGNOSIS — Z1152 Encounter for screening for COVID-19: Secondary | ICD-10-CM | POA: Diagnosis not present

## 2022-06-07 DIAGNOSIS — Z789 Other specified health status: Secondary | ICD-10-CM | POA: Diagnosis not present

## 2022-06-07 DIAGNOSIS — Z7951 Long term (current) use of inhaled steroids: Secondary | ICD-10-CM | POA: Diagnosis not present

## 2022-06-07 DIAGNOSIS — F32A Depression, unspecified: Secondary | ICD-10-CM | POA: Diagnosis present

## 2022-06-07 DIAGNOSIS — F4310 Post-traumatic stress disorder, unspecified: Secondary | ICD-10-CM | POA: Diagnosis present

## 2022-06-07 DIAGNOSIS — Z936 Other artificial openings of urinary tract status: Secondary | ICD-10-CM | POA: Diagnosis not present

## 2022-06-07 DIAGNOSIS — J121 Respiratory syncytial virus pneumonia: Secondary | ICD-10-CM | POA: Diagnosis not present

## 2022-06-07 DIAGNOSIS — Z6833 Body mass index (BMI) 33.0-33.9, adult: Secondary | ICD-10-CM | POA: Diagnosis not present

## 2022-06-07 DIAGNOSIS — D509 Iron deficiency anemia, unspecified: Secondary | ICD-10-CM | POA: Diagnosis present

## 2022-06-07 DIAGNOSIS — G35 Multiple sclerosis: Secondary | ICD-10-CM | POA: Diagnosis present

## 2022-06-07 DIAGNOSIS — E876 Hypokalemia: Secondary | ICD-10-CM | POA: Diagnosis not present

## 2022-06-07 DIAGNOSIS — J209 Acute bronchitis, unspecified: Secondary | ICD-10-CM | POA: Diagnosis present

## 2022-06-07 LAB — COMPREHENSIVE METABOLIC PANEL
ALT: 58 U/L — ABNORMAL HIGH (ref 0–44)
AST: 84 U/L — ABNORMAL HIGH (ref 15–41)
Albumin: 4.2 g/dL (ref 3.5–5.0)
Alkaline Phosphatase: 66 U/L (ref 38–126)
Anion gap: 9 (ref 5–15)
BUN: 9 mg/dL (ref 6–20)
CO2: 27 mmol/L (ref 22–32)
Calcium: 8.4 mg/dL — ABNORMAL LOW (ref 8.9–10.3)
Chloride: 102 mmol/L (ref 98–111)
Creatinine, Ser: 0.43 mg/dL — ABNORMAL LOW (ref 0.44–1.00)
GFR, Estimated: >60 mL/min (ref >60)
Glucose, Bld: 117 mg/dL — ABNORMAL HIGH (ref 70–99)
Potassium: 3.9 mmol/L (ref 3.5–5.1)
Sodium: 138 mmol/L (ref 135–145)
Total Bilirubin: 0.5 mg/dL (ref 0.3–1.2)
Total Protein: 7.3 g/dL (ref 6.5–8.1)

## 2022-06-07 LAB — CBC WITH DIFFERENTIAL/PLATELET
Abs Immature Granulocytes: 0.03 10*3/uL (ref 0.00–0.07)
Basophils Absolute: 0 10*3/uL (ref 0.0–0.1)
Basophils Relative: 0 %
Eosinophils Absolute: 0 10*3/uL (ref 0.0–0.5)
Eosinophils Relative: 1 %
HCT: 32.2 % — ABNORMAL LOW (ref 36.0–46.0)
Hemoglobin: 9.3 g/dL — ABNORMAL LOW (ref 12.0–15.0)
Immature Granulocytes: 1 %
Lymphocytes Relative: 9 %
Lymphs Abs: 0.5 10*3/uL — ABNORMAL LOW (ref 0.7–4.0)
MCH: 22.2 pg — ABNORMAL LOW (ref 26.0–34.0)
MCHC: 28.9 g/dL — ABNORMAL LOW (ref 30.0–36.0)
MCV: 76.8 fL — ABNORMAL LOW (ref 80.0–100.0)
Monocytes Absolute: 0.4 10*3/uL (ref 0.1–1.0)
Monocytes Relative: 7 %
Neutro Abs: 5 10*3/uL (ref 1.7–7.7)
Neutrophils Relative %: 82 %
Platelets: 208 10*3/uL (ref 150–400)
RBC: 4.19 MIL/uL (ref 3.87–5.11)
RDW: 18.2 % — ABNORMAL HIGH (ref 11.5–15.5)
WBC: 6 10*3/uL (ref 4.0–10.5)
nRBC: 0 % (ref 0.0–0.2)

## 2022-06-07 LAB — URINALYSIS, ROUTINE W REFLEX MICROSCOPIC
Bilirubin Urine: NEGATIVE
Glucose, UA: NEGATIVE mg/dL
Hgb urine dipstick: NEGATIVE
Ketones, ur: NEGATIVE mg/dL
Nitrite: POSITIVE — AB
Protein, ur: 30 mg/dL — AB
Specific Gravity, Urine: 1.019 (ref 1.005–1.030)
pH: 5 (ref 5.0–8.0)

## 2022-06-07 LAB — RESP PANEL BY RT-PCR (RSV, FLU A&B, COVID)  RVPGX2
Influenza A by PCR: NEGATIVE
Influenza B by PCR: NEGATIVE
Resp Syncytial Virus by PCR: POSITIVE — AB
SARS Coronavirus 2 by RT PCR: NEGATIVE

## 2022-06-07 LAB — C-REACTIVE PROTEIN: CRP: 10.6 mg/dL — ABNORMAL HIGH (ref <1.0)

## 2022-06-07 LAB — LACTIC ACID, PLASMA
Lactic Acid, Venous: 2.1 mmol/L (ref 0.5–1.9)
Lactic Acid, Venous: 0.8 mmol/L (ref 0.5–1.9)

## 2022-06-07 LAB — PHOSPHORUS: Phosphorus: 3.2 mg/dL (ref 2.5–4.6)

## 2022-06-07 LAB — STREP PNEUMONIAE URINARY ANTIGEN: Strep Pneumo Urinary Antigen: NEGATIVE

## 2022-06-07 LAB — MAGNESIUM: Magnesium: 2.6 mg/dL — ABNORMAL HIGH (ref 1.7–2.4)

## 2022-06-07 LAB — PROCALCITONIN: Procalcitonin: 0.12 ng/mL

## 2022-06-07 LAB — HCG, QUANTITATIVE, PREGNANCY: hCG, Beta Chain, Quant, S: <1 m[IU]/mL (ref <5)

## 2022-06-07 MED ORDER — ACETAMINOPHEN-CODEINE 300-30 MG PO TABS
1.0000 | ORAL_TABLET | Freq: Two times a day (BID) | ORAL | Status: DC | PRN
  Administered 2022-06-07: 1 via ORAL
  Filled 2022-06-07: qty 1

## 2022-06-07 MED ORDER — IPRATROPIUM-ALBUTEROL 0.5-2.5 (3) MG/3ML IN SOLN: 3.0000 mL | Freq: Four times a day (QID) | RESPIRATORY_TRACT | Status: DC | PRN

## 2022-06-07 MED ORDER — BENZONATATE 100 MG PO CAPS
200.0000 mg | ORAL_CAPSULE | Freq: Three times a day (TID) | ORAL | Status: DC | PRN
  Administered 2022-06-08 – 2022-06-12 (×2): 200 mg via ORAL
  Filled 2022-06-07 (×3): qty 2

## 2022-06-07 MED ORDER — IPRATROPIUM-ALBUTEROL 0.5-2.5 (3) MG/3ML IN SOLN
3.0000 mL | Freq: Three times a day (TID) | RESPIRATORY_TRACT | Status: DC
  Administered 2022-06-08: 3 mL via RESPIRATORY_TRACT
  Filled 2022-06-07: qty 3

## 2022-06-07 MED ORDER — GABAPENTIN 300 MG PO CAPS
900.0000 mg | ORAL_CAPSULE | Freq: Three times a day (TID) | ORAL | Status: DC
  Administered 2022-06-07 – 2022-06-13 (×18): 900 mg via ORAL
  Filled 2022-06-07 (×20): qty 3

## 2022-06-07 MED ORDER — METHYLPREDNISOLONE SODIUM SUCC 40 MG IJ SOLR
40.0000 mg | Freq: Every day | INTRAMUSCULAR | Status: DC
  Administered 2022-06-07 – 2022-06-08 (×2): 40 mg via INTRAVENOUS
  Filled 2022-06-07 (×2): qty 1

## 2022-06-07 MED ORDER — DIAZEPAM 5 MG PO TABS
5.0000 mg | ORAL_TABLET | Freq: Every day | ORAL | Status: DC
  Administered 2022-06-07 – 2022-06-12 (×6): 5 mg via ORAL
  Filled 2022-06-07 (×6): qty 1

## 2022-06-07 MED ORDER — ENOXAPARIN SODIUM 40 MG/0.4ML IJ SOSY
40.0000 mg | PREFILLED_SYRINGE | INTRAMUSCULAR | Status: DC
  Administered 2022-06-07 – 2022-06-13 (×7): 40 mg via SUBCUTANEOUS
  Filled 2022-06-07 (×7): qty 0.4

## 2022-06-07 MED ORDER — SODIUM CHLORIDE 0.9 % IV SOLN
500.0000 mg | INTRAVENOUS | Status: DC
  Administered 2022-06-07 – 2022-06-08 (×2): 500 mg via INTRAVENOUS
  Filled 2022-06-07 (×3): qty 5

## 2022-06-07 MED ORDER — PANTOPRAZOLE SODIUM 40 MG PO TBEC
40.0000 mg | DELAYED_RELEASE_TABLET | Freq: Every day | ORAL | Status: DC
  Administered 2022-06-07 – 2022-06-11 (×5): 40 mg via ORAL
  Filled 2022-06-07 (×5): qty 1

## 2022-06-07 MED ORDER — SERTRALINE HCL 100 MG PO TABS
100.0000 mg | ORAL_TABLET | Freq: Every day | ORAL | Status: DC
  Administered 2022-06-07 – 2022-06-13 (×7): 100 mg via ORAL
  Filled 2022-06-07: qty 2
  Filled 2022-06-07 (×6): qty 1

## 2022-06-07 MED ORDER — ACETAMINOPHEN 650 MG RE SUPP: 650.0000 mg | Freq: Four times a day (QID) | RECTAL | Status: DC | PRN

## 2022-06-07 MED ORDER — ZINC SULFATE 220 (50 ZN) MG PO CAPS
220.0000 mg | ORAL_CAPSULE | Freq: Every day | ORAL | Status: DC
  Administered 2022-06-07 – 2022-06-13 (×7): 220 mg via ORAL
  Filled 2022-06-07 (×7): qty 1

## 2022-06-07 MED ORDER — LAMOTRIGINE 100 MG PO TABS
100.0000 mg | ORAL_TABLET | Freq: Two times a day (BID) | ORAL | Status: DC
  Administered 2022-06-07 – 2022-06-13 (×13): 100 mg via ORAL
  Filled 2022-06-07 (×13): qty 1

## 2022-06-07 MED ORDER — DIAZEPAM 5 MG PO TABS
5.0000 mg | ORAL_TABLET | Freq: Every day | ORAL | Status: DC
  Administered 2022-06-08 – 2022-06-13 (×6): 5 mg via ORAL
  Filled 2022-06-07 (×6): qty 1

## 2022-06-07 MED ORDER — MELATONIN 3 MG PO TABS: 3.0000 mg | ORAL_TABLET | Freq: Every evening | ORAL | Status: DC | PRN

## 2022-06-07 MED ORDER — GUAIFENESIN ER 600 MG PO TB12
600.0000 mg | ORAL_TABLET | Freq: Two times a day (BID) | ORAL | Status: DC
  Administered 2022-06-07 – 2022-06-13 (×13): 600 mg via ORAL
  Filled 2022-06-07 (×13): qty 1

## 2022-06-07 MED ORDER — IPRATROPIUM-ALBUTEROL 0.5-2.5 (3) MG/3ML IN SOLN
3.0000 mL | Freq: Four times a day (QID) | RESPIRATORY_TRACT | Status: DC
  Administered 2022-06-07 (×2): 3 mL via RESPIRATORY_TRACT
  Filled 2022-06-07 (×2): qty 3

## 2022-06-07 MED ORDER — RISAQUAD PO CAPS
1.0000 | ORAL_CAPSULE | Freq: Every day | ORAL | Status: DC
  Administered 2022-06-07 – 2022-06-13 (×7): 1 via ORAL
  Filled 2022-06-07 (×7): qty 1

## 2022-06-07 MED ORDER — SODIUM CHLORIDE 0.9 % IV BOLUS
1000.0000 mL | Freq: Once | INTRAVENOUS | Status: AC
  Administered 2022-06-07: 1000 mL via INTRAVENOUS

## 2022-06-07 MED ORDER — ONDANSETRON HCL 4 MG/2ML IJ SOLN
4.0000 mg | Freq: Four times a day (QID) | INTRAMUSCULAR | Status: DC | PRN
  Administered 2022-06-07: 4 mg via INTRAVENOUS
  Filled 2022-06-07: qty 2

## 2022-06-07 MED ORDER — BACLOFEN 20 MG PO TABS
20.0000 mg | ORAL_TABLET | Freq: Four times a day (QID) | ORAL | Status: DC
  Administered 2022-06-07 – 2022-06-13 (×24): 20 mg via ORAL
  Filled 2022-06-07 (×3): qty 1
  Filled 2022-06-07: qty 2
  Filled 2022-06-07 (×20): qty 1

## 2022-06-07 MED ORDER — BUDESONIDE 0.5 MG/2ML IN SUSP
0.5000 mg | Freq: Two times a day (BID) | RESPIRATORY_TRACT | Status: DC
  Administered 2022-06-07 – 2022-06-11 (×9): 0.5 mg via RESPIRATORY_TRACT
  Filled 2022-06-07 (×9): qty 2

## 2022-06-07 MED ORDER — SODIUM CHLORIDE 0.9 % IV SOLN: INTRAVENOUS | Status: DC

## 2022-06-07 MED ORDER — ACETAMINOPHEN 325 MG PO TABS: 650.0000 mg | ORAL_TABLET | Freq: Four times a day (QID) | ORAL | Status: DC | PRN

## 2022-06-07 MED ORDER — SODIUM CHLORIDE 0.9 % IV SOLN
1.0000 g | INTRAVENOUS | Status: AC
  Administered 2022-06-07 – 2022-06-11 (×5): 1 g via INTRAVENOUS
  Filled 2022-06-07 (×5): qty 10

## 2022-06-07 MED ORDER — VITAMIN C 500 MG PO TABS
500.0000 mg | ORAL_TABLET | Freq: Every day | ORAL | Status: DC
  Administered 2022-06-07 – 2022-06-13 (×7): 500 mg via ORAL
  Filled 2022-06-07 (×7): qty 1

## 2022-06-07 MED ORDER — GABAPENTIN 300 MG PO CAPS
300.0000 mg | ORAL_CAPSULE | Freq: Three times a day (TID) | ORAL | Status: DC
  Administered 2022-06-07: 300 mg via ORAL
  Filled 2022-06-07: qty 1

## 2022-06-07 MED ORDER — OLANZAPINE 2.5 MG PO TABS
2.5000 mg | ORAL_TABLET | Freq: Every day | ORAL | Status: DC
  Administered 2022-06-07 – 2022-06-12 (×6): 2.5 mg via ORAL
  Filled 2022-06-07 (×7): qty 1

## 2022-06-07 MED ORDER — SODIUM CHLORIDE 0.9% FLUSH
3.0000 mL | Freq: Two times a day (BID) | INTRAVENOUS | Status: DC
  Administered 2022-06-07 – 2022-06-13 (×12): 3 mL via INTRAVENOUS

## 2022-06-07 MED ORDER — MENTHOL 3 MG MT LOZG: 1.0000 | LOZENGE | OROMUCOSAL | Status: DC | PRN

## 2022-06-07 NOTE — ED Notes (Signed)
Catheter bag changed to be able to obtain urine sample.

## 2022-06-07 NOTE — ED Notes (Signed)
ED TO INPATIENT HANDOFF REPORT  Name/Age/Gender [name] 42 y.o. female  Code Status    Code Status Orders  (From admission, onward)           Start     Ordered   06/07/22 0634  Full code  Continuous       Question:  By:  Answer:  Other   06/07/22 0634           Code Status History     Date Active Date Inactive Code Status Order ID Comments User Context   06/07/2022 0249 06/07/2022 0634 Full Code [id number]  Rhetta Mura, DO ED   10/19/2021 1212 10/25/2021 1631 Full Code [id number]  Lattie Corns Inpatient   08/14/2021 0453 08/17/2021 0435 Full Code [id number]  Rise Patience, MD ED   03/08/2018 0128 03/12/2018 1405 Full Code [id number]  Gwynne Edinger, MD Inpatient   01/02/2018 0028 01/04/2018 1909 Full Code [id number]  Lenore Cordia, MD ED   04/24/2017 2310 04/30/2017 1508 Full Code [id number]  Elmarie Shiley, MD Inpatient   03/26/2017 0453 04/03/2017 2111 Full Code [id number]  [name], DO ED   02/14/2017 0100 03/12/2017 1736 Full Code [id number]  Phillips Grout, MD Inpatient   11/15/2016 0245 11/17/2016 1848 Full Code [id number]  [name], MD Inpatient   07/17/2016 2321 07/19/2016 2047 Full Code [id number]  Ivor Costa, MD ED   02/15/2016 2217 02/25/2016 2001 Full Code [id number]  [name], MD Inpatient   01/07/2016 0958 01/09/2016 1424 Full Code [id number]  [name], MD Inpatient   10/29/2014 2114 10/31/2014 1711 Full Code [id number]  [name], MD Inpatient   09/27/2014 2249 09/28/2014 2046 Full Code [id number]  [name], DO ED   03/30/2013 1215 04/02/2013 1732 Full Code [id number]  [name], MD Inpatient   02/16/2012 0146 02/18/2012 1853 Full Code [id number]  [name]., DO ED       Home/SNF/Other Nursing Home  Chief Complaint RSV infection [B33.8]  Level of Care/Admitting Diagnosis ED Disposition     ED Disposition  Admit   Condition  --   [facility]: [facility] [100102]  Level of Care: Telemetry [5]  Admit to tele based on following criteria: Monitor for Ischemic changes  May admit patient to [facility] or [facility] if equivalent level of care is available:: No  Covid Evaluation: Asymptomatic - no recent exposure (last 10 days) testing not required  Diagnosis: RSV infection [id number]  Admitting Physician: Rhetta Mura [id number]  Attending Physician: Rhetta Mura [id number]  Certification:: I certify this patient will need inpatient services for at least 2 midnights  Estimated Length of Stay: 2          Medical History Past Medical History:  Diagnosis Date   Bladder stones    Hx of   Depression    GERD (gastroesophageal reflux disease)    Intertrigo 04/24/2017   Left upper extremity swelling 04/24/2017   Multiple sclerosis ([place])    Paraplegia ([place])    Polycystic disease, ovaries    PTSD (post-traumatic stress disorder)    Transverse myelitis ([place])    Yeast infection 09/26/2017    Allergies Allergies  Allergen Reactions   Iodine Anaphylaxis   Latex Anaphylaxis   Other Other (See Comments)    Documented on Order Summary Report - unknown reaction Apricot, collard greens, green bean, melon, peaches, turnip greens   Polymyxin B Hives  Pt had hives in right arm with infusion of polymyxin on November 26th, 2018   Shellfish-Derived Products Anaphylaxis and Other (See Comments)    All seafood   Vancomycin Anaphylaxis   Aspirin Other (See Comments)    "Allergic," per [place] Other (See Comments)    "Allergic," per [place] and Other (See Comments)    Patient requires 13 hour  prep prior to administration   Strawberry Extract Hives and Rash    IV Location/Drains/Wounds Patient Lines/Drains/Airways Status     Active Line/Drains/Airways     Name Placement date Placement time Site Days   Peripheral IV 06/06/22 20 G Left Antecubital  06/06/22  2315  Antecubital  1   Peripheral IV 06/06/22 22 G Anterior;Left Forearm 06/06/22  2350  Forearm  1   Urostomy Ileal conduit LLQ --  --  LLQ  --   Pressure Injury Heel Left Deep Tissue Pressure Injury - Purple or maroon localized area of discolored intact skin or blood-filled blister due to damage of underlying soft tissue from pressure and/or shear. --  --  -- --   Wound / Incision (Open or Dehisced) 06/07/22 Other (Comment) Heel Left 06/07/22  --  Heel  less than 1   Wound / Incision (Open or Dehisced) 06/07/22 Other (Comment) Heel Right 06/07/22  --  Heel  less than 1            Labs/Imaging Results for orders placed or performed during the hospital encounter of 06/06/22 (from the past 48 hour(s))  Comprehensive metabolic panel     Status: Abnormal   Collection Time: 06/06/22 11:15 PM  Result Value Ref Range   Sodium 134 (L) 135 - 145 mmol/L   Potassium 3.7 3.5 - 5.1 mmol/L   Chloride 101 98 - 111 mmol/L   CO2 25 22 - 32 mmol/L   Glucose, Bld 148 (H) 70 - 99 mg/dL    Comment: Glucose reference range applies only to samples taken after fasting for at least 8 hours.   BUN 10 6 - 20 mg/dL   Creatinine, Ser 0.48 0.44 - 1.00 mg/dL   Calcium 8.6 (L) 8.9 - 10.3 mg/dL   Total Protein 7.6 6.5 - 8.1 g/dL   Albumin 4.3 3.5 - 5.0 g/dL   AST 84 (H) 15 - 41 U/L   ALT 61 (H) 0 - 44 U/L   Alkaline Phosphatase 83 38 - 126 U/L   Total Bilirubin 0.7 0.3 - 1.2 mg/dL   GFR, Estimated >60 >60 mL/min    Comment: (NOTE) Calculated using the CKD-EPI Creatinine Equation (2021)    Anion gap 8 5 - 15    Comment: Performed at [facility], [place] [address] [place], [place] [address]  Lactic acid, plasma     Status: Abnormal   Collection Time: 06/06/22 11:15 PM  Result Value Ref Range   Lactic Acid, Venous 2.1 (HH) 0.5 - 1.9 mmol/L    Comment: CRITICAL RESULT CALLED TO, READ BACK BY AND VERIFIED WITH MCDONALD,S RN AT 0007 ON 06/07/22 BY [email or web address] Performed at [facility], [place] [address], [place], [place] [address]   CBC with Differential     Status: Abnormal   Collection Time: 06/06/22 11:15 PM  Result Value Ref Range   WBC 7.6 4.0 - 10.5 K/uL   RBC 4.35 3.87 - 5.11 MIL/uL   Hemoglobin 9.7 (L) 12.0 - 15.0 g/dL   HCT  33.0 (L) 36.0 - 46.0 %   MCV 75.9 (L) 80.0 - 100.0 fL   MCH 22.3 (L) 26.0 - 34.0 pg   MCHC 29.4 (L) 30.0 - 36.0 g/dL   RDW 18.1 (H) 11.5 - 15.5 %   Platelets 232 150 - 400 K/uL   nRBC 0.0 0.0 - 0.2 %   Neutrophils Relative % 92 %   Neutro Abs 6.9 1.7 - 7.7 K/uL   Lymphocytes Relative 3 %   Lymphs Abs 0.3 (L) 0.7 - 4.0 K/uL   Monocytes Relative 5 %   Monocytes Absolute 0.4 0.1 - 1.0 K/uL   Eosinophils Relative 0 %   Eosinophils Absolute 0.0 0.0 - 0.5 K/uL   Basophils Relative 0 %   Basophils Absolute 0.0 0.0 - 0.1 K/uL   Immature Granulocytes 0 %   Abs Immature Granulocytes 0.03 0.00 - 0.07 K/uL    Comment: Performed at [facility], [place] [address], [place], [place] [address]  Protime-INR     Status: None   Collection Time: 06/06/22 11:15 PM  Result Value Ref Range   Prothrombin Time 14.1 11.4 - 15.2 seconds   INR 1.1 0.8 - 1.2    Comment: (NOTE) INR goal varies based on device and disease states. Performed at [facility], [place] [address] [place], [place] [address]   Culture, blood (Routine x 2)     Status: None (Preliminary result)   Collection Time: 06/06/22 11:15 PM   Specimen: Right Antecubital; Blood  Result Value Ref Range   Specimen Description      RIGHT ANTECUBITAL BLOOD Performed at [place] [address], [place], [place] [address]    Special Requests      BOTTLES DRAWN AEROBIC AND ANAEROBIC Blood Culture results may not be optimal due to an inadequate volume of blood received in culture bottles Performed at [facility], [place] [address], [place], [place] [address]    Culture PENDING    Report Status PENDING   hCG,  quantitative, pregnancy     Status: None   Collection Time: 06/06/22 11:15 PM  Result Value Ref Range   hCG, Beta Chain, Quant, S <1 <5 mIU/mL    Comment:          GEST. AGE      CONC.  (mIU/mL)   <=1 WEEK        5 - 50     2 WEEKS       50 - 500     3 WEEKS       100 - 10,000     4 WEEKS     1,000 - 30,000     5 WEEKS     3,500 - 115,000   6-8 WEEKS     12,000 - 270,000    12 WEEKS     15,000 - 220,000        FEMALE AND NON-PREGNANT FEMALE:     LESS THAN 5 mIU/mL Performed at [facility], [place] [address] [place], [place] [address]   Resp panel by RT-PCR (RSV, Flu A&B, Covid) Anterior Nasal Swab     Status: Abnormal   Collection Time: 06/06/22 11:15 PM   Specimen: Anterior Nasal Swab  Result Value Ref Range   SARS Coronavirus 2 by RT PCR NEGATIVE NEGATIVE    Comment: (NOTE) SARS-CoV-2 target nucleic acids are NOT DETECTED.  The SARS-CoV-2 RNA is generally detectable in upper respiratory specimens during the  acute phase of infection. The lowest concentration of SARS-CoV-2 viral copies this assay can detect is 138 copies/mL. A negative result does not preclude SARS-Cov-2 infection and should not be used as the sole basis for treatment or other patient management decisions. A negative result may occur with  improper specimen collection/handling, submission of specimen other than nasopharyngeal swab, presence of viral mutation(s) within the areas targeted by this assay, and inadequate number of viral copies(<138 copies/mL). A negative result must be combined with clinical observations, patient history, and epidemiological information. The expected result is Negative.  Fact Sheet for Patients:  EntrepreneurPulse.com.au  Fact Sheet for Healthcare Providers:  IncredibleEmployment.be  This test is no t yet approved or cleared by the [place] FDA and  has been authorized for detection and/or diagnosis of SARS-CoV-2 by FDA  under an Emergency Use Authorization (EUA). This EUA will remain  in effect (meaning this test can be used) for the duration of the COVID-19 declaration under Section 564(b)(1) of the Act, 21 U.S.C.section 360bbb-3(b)(1), unless the authorization is terminated  or revoked sooner.       Influenza A by PCR NEGATIVE NEGATIVE   Influenza B by PCR NEGATIVE NEGATIVE    Comment: (NOTE) The Xpert Xpress SARS-CoV-2/FLU/RSV plus assay is intended as an aid in the diagnosis of influenza from Nasopharyngeal swab specimens and should not be used as a sole basis for treatment. Nasal washings and aspirates are unacceptable for Xpert Xpress SARS-CoV-2/FLU/RSV testing.  Fact Sheet for Patients: EntrepreneurPulse.com.au  Fact Sheet for Healthcare Providers: IncredibleEmployment.be  This test is not yet approved or cleared by the [place] FDA and has been authorized for detection and/or diagnosis of SARS-CoV-2 by FDA under an Emergency Use Authorization (EUA). This EUA will remain in effect (meaning this test can be used) for the duration of the COVID-19 declaration under Section 564(b)(1) of the Act, 21 U.S.C. section 360bbb-3(b)(1), unless the authorization is terminated or revoked.     Resp Syncytial Virus by PCR POSITIVE (A) NEGATIVE    Comment: (NOTE) Fact Sheet for Patients: EntrepreneurPulse.com.au  Fact Sheet for Healthcare Providers: IncredibleEmployment.be  This test is not yet approved or cleared by the [place] FDA and has been authorized for detection and/or diagnosis of SARS-CoV-2 by FDA under an Emergency Use Authorization (EUA). This EUA will remain in effect (meaning this test can be used) for the duration of the COVID-19 declaration under Section 564(b)(1) of the Act, 21 U.S.C. section 360bbb-3(b)(1), unless the authorization is terminated or revoked.  Performed at [facility], [place] [address], [place], [place] [address]   Urinalysis, Routine w reflex microscopic -Urine, Suprapubic     Status: Abnormal   Collection Time: 06/07/22  1:25 AM  Result Value Ref Range   Color, Urine YELLOW YELLOW   APPearance CLOUDY (A) CLEAR   Specific Gravity, Urine 1.019 1.005 - 1.030   pH 5.0 5.0 - 8.0   Glucose, UA NEGATIVE NEGATIVE mg/dL   Hgb urine dipstick NEGATIVE NEGATIVE   Bilirubin Urine NEGATIVE NEGATIVE   Ketones, ur NEGATIVE NEGATIVE mg/dL   Protein, ur 30 (A) NEGATIVE mg/dL   Nitrite POSITIVE (A) NEGATIVE   Leukocytes,Ua LARGE (A) NEGATIVE   RBC / HPF 0-5 0 - 5 RBC/hpf   WBC, UA 21-50 0 - 5 WBC/hpf   Bacteria, UA FEW (A) NONE SEEN   Squamous Epithelial / HPF 0-5 0 - 5 /HPF   Mucus PRESENT    Hyaline Casts, UA PRESENT  Comment: Performed at [facility], [place] [address], [place], [place] [address]  Lactic acid, plasma     Status: None   Collection Time: 06/07/22  2:35 AM  Result Value Ref Range   Lactic Acid, Venous 0.8 0.5 - 1.9 mmol/L    Comment: Performed at [facility], [place] [address], [place], [place] [address]  CBC with Differential/Platelet     Status: Abnormal   Collection Time: 06/07/22  4:15 AM  Result Value Ref Range   WBC 6.0 4.0 - 10.5 K/uL   RBC 4.19 3.87 - 5.11 MIL/uL   Hemoglobin 9.3 (L) 12.0 - 15.0 g/dL   HCT 32.2 (L) 36.0 - 46.0 %   MCV 76.8 (L) 80.0 - 100.0 fL   MCH 22.2 (L) 26.0 - 34.0 pg   MCHC 28.9 (L) 30.0 - 36.0 g/dL   RDW 18.2 (H) 11.5 - 15.5 %   Platelets 208 150 - 400 K/uL   nRBC 0.0 0.0 - 0.2 %   Neutrophils Relative % 82 %   Neutro Abs 5.0 1.7 - 7.7 K/uL   Lymphocytes Relative 9 %   Lymphs Abs 0.5 (L) 0.7 - 4.0 K/uL   Monocytes Relative 7 %   Monocytes Absolute 0.4 0.1 - 1.0 K/uL   Eosinophils Relative 1 %   Eosinophils Absolute 0.0 0.0 - 0.5 K/uL   Basophils Relative 0 %   Basophils Absolute 0.0 0.0 - 0.1 K/uL   Immature Granulocytes 1 %   Abs Immature Granulocytes  0.03 0.00 - 0.07 K/uL    Comment: Performed at [facility], [place] [address] [place], [place] [address]  Comprehensive metabolic panel     Status: Abnormal   Collection Time: 06/07/22  4:15 AM  Result Value Ref Range   Sodium 138 135 - 145 mmol/L   Potassium 3.9 3.5 - 5.1 mmol/L   Chloride 102 98 - 111 mmol/L   CO2 27 22 - 32 mmol/L   Glucose, Bld 117 (H) 70 - 99 mg/dL    Comment: Glucose reference range applies only to samples taken after fasting for at least 8 hours.   BUN 9 6 - 20 mg/dL   Creatinine, Ser 0.43 (L) 0.44 - 1.00 mg/dL   Calcium 8.4 (L) 8.9 - 10.3 mg/dL   Total Protein 7.3 6.5 - 8.1 g/dL   Albumin 4.2 3.5 - 5.0 g/dL   AST 84 (H) 15 - 41 U/L   ALT 58 (H) 0 - 44 U/L   Alkaline Phosphatase 66 38 - 126 U/L   Total Bilirubin 0.5 0.3 - 1.2 mg/dL   GFR, Estimated >60 >60 mL/min    Comment: (NOTE) Calculated using the CKD-EPI Creatinine Equation (2021)    Anion gap 9 5 - 15    Comment: Performed at [facility], [place] [address], [place], [place] [address]  Magnesium     Status: Abnormal   Collection Time: 06/07/22  4:15 AM  Result Value Ref Range   Magnesium 2.6 (H) 1.7 - 2.4 mg/dL    Comment: Performed at [facility], [place] [address], [place], [place] [address]  Phosphorus     Status: None   Collection Time: 06/07/22  4:15 AM  Result Value Ref Range   Phosphorus 3.2 2.5 - 4.6 mg/dL    Comment: Performed at [facility], [place] [address] [place], [place] [address]   DG Chest Port 1 View  Result Date: 06/06/2022 CLINICAL DATA:  Shortness of breath, cough EXAM: PORTABLE CHEST 1 VIEW COMPARISON:  10/23/2021 FINDINGS: Lungs are clear.  No pleural effusion or pneumothorax. The heart is normal in size. IMPRESSION: No evidence of acute cardiopulmonary disease. Electronically Signed   By: Julian Hy M.D.   On: 06/06/2022 23:37    Pending Labs Unresulted Labs (From admission, onward)     Start      Ordered   06/14/22 0500  Creatinine, serum  (enoxaparin (LOVENOX)    CrCl >/= 30 ml/min)  Weekly,   R     Comments: while on enoxaparin therapy    06/07/22 0634   06/08/22 0500  Comprehensive metabolic panel  Tomorrow morning,   R        06/07/22 0634   06/08/22 0500  CBC  Tomorrow morning,   R        06/07/22 0634   06/07/22 1012  Procalcitonin  Once,   R       References:    Procalcitonin Lower Respiratory Tract Infection AND Sepsis Procalcitonin Algorithm   06/07/22 1011   06/07/22 1012  C-reactive protein  Once,   R        06/07/22 1011   06/07/22 1012  Strep pneumoniae urinary antigen  Once,   R        06/07/22 1011   06/07/22 1012  Legionella Pneumophila Serogp 1 Ur Ag  Once,   R        06/07/22 1011   06/06/22 2313  Culture, blood (Routine x 2)  BLOOD CULTURE X 2,   R (with STAT occurrences)      06/06/22 2314            Vitals/Pain Today's Vitals   06/07/22 0639 06/07/22 0700 06/07/22 0951 06/07/22 1000  BP:  121/73 116/69 111/63  Pulse:  92 97 95  Resp:   (!) 25 (!) 27  Temp:   98.4 F (36.9 C)   TempSrc:   Oral   SpO2:  96% 93% 91%  Weight:      Height:      PainSc: 0-No pain       Isolation Precautions Airborne and Contact precautions  Medications Medications  acetaminophen (TYLENOL) tablet 650 mg (has no administration in time range)    Or  acetaminophen (TYLENOL) suppository 650 mg (has no administration in time range)  benzonatate (TESSALON) capsule 200 mg (has no administration in time range)  menthol-cetylpyridinium (CEPACOL) lozenge 3 mg (has no administration in time range)  guaiFENesin (MUCINEX) 12 hr tablet 600 mg (600 mg Oral Given 06/07/22 0931)  melatonin tablet 3 mg (has no administration in time range)  budesonide (PULMICORT) nebulizer solution 0.5 mg (0.5 mg Nebulization Given 06/07/22 0811)  enoxaparin (LOVENOX) injection 40 mg (40 mg Subcutaneous Given 06/07/22 0931)  sodium chloride flush (NS) 0.9 % injection 3 mL (3 mLs Intravenous  Given 06/07/22 0932)  ondansetron (ZOFRAN) injection 4 mg (4 mg Intravenous Given 06/07/22 0931)  ipratropium-albuterol (DUONEB) 0.5-2.5 (3) MG/3ML nebulizer solution 3 mL (3 mLs Nebulization Given 06/07/22 1207)    Followed by  ipratropium-albuterol (DUONEB) 0.5-2.5 (3) MG/3ML nebulizer solution 3 mL (has no administration in time range)  sertraline (ZOLOFT) tablet 100 mg (100 mg Oral Given 06/07/22 1207)  zinc sulfate capsule 220 mg (220 mg Oral Given 06/07/22 1207)  acidophilus (RISAQUAD) capsule 1 capsule (has no administration in time range)  lamoTRIgine (LAMICTAL) tablet 100 mg (100 mg Oral Given 06/07/22 1207)  OLANZapine (ZYPREXA) tablet 2.5 mg (has  no administration in time range)  pantoprazole (PROTONIX) EC tablet 40 mg (40 mg Oral Given 06/07/22 1207)  ascorbic acid (VITAMIN C) tablet 500 mg (500 mg Oral Given 06/07/22 1207)  acetaminophen-codeine (TYLENOL #3) 300-30 MG per tablet 1 tablet (1 tablet Oral Given 06/07/22 1206)  baclofen (LIORESAL) tablet 20 mg (20 mg Oral Given 06/07/22 1207)  diazepam (VALIUM) tablet 5 mg (has no administration in time range)  diazepam (VALIUM) tablet 5 mg (has no administration in time range)  gabapentin (NEURONTIN) capsule 300 mg (300 mg Oral Given 06/07/22 1207)  0.9 %  sodium chloride infusion ( Intravenous New Bag/Given 06/07/22 1218)  cefTRIAXone (ROCEPHIN) 1 g in sodium chloride 0.9 % 100 mL IVPB (1 g Intravenous New Bag/Given 06/07/22 1219)  azithromycin (ZITHROMAX) 500 mg in sodium chloride 0.9 % 250 mL IVPB (has no administration in time range)  methylPREDNISolone sodium succinate (SOLU-MEDROL) 40 mg/mL injection 40 mg (40 mg Intravenous Given 06/07/22 1207)  sodium chloride 0.9 % bolus 1,000 mL (0 mLs Intravenous Stopped 06/07/22 0233)    Mobility non-ambulatory

## 2022-06-07 NOTE — Progress Notes (Signed)
Carryover admission to the Day Admitter.  I discussed this case with the EDP, Suella Broad, PA.  Per these discussions:   This is a  42 y.o. F w/ h/o MS, who is being admitted with RSV infection complicated by acute hypoxic respiratory distress after presenting with 2 to 3 days of new onset cough associated with subjective fever.  She resides at a facility, and reports that there has been an outbreak of RSV at that site.  Denies any known baseline supplemental oxygen requirements.  Presenting vital signs were notable for initial oxygen saturation 86% on room air, subsequently improving into the mid 90s on 2 L nasal cannula.  Was found to be RSV PCR positive.  Chest x-ray showed no evidence of acute process, including no evidence of infiltrate or edema.  I have placed an order for inpatient admission to med/tele for further evaluation management of the above.  I have placed some additional preliminary admit orders via the adult multi-morbid admission order set. I have also ordered supportive measures including scheduled Mucinex, prn Tessalon Perles, incentive spirometry, flutter valve.  Of also ordered morning labs in the form of CMP, CBC, serum magnesium level as well as phosphorus level.    Babs Bertin, DO Hospitalist

## 2022-06-07 NOTE — ED Notes (Signed)
Assisted cleaning pt with soap and water applied barrier cream changed gown, and brief

## 2022-06-07 NOTE — ED Notes (Signed)
Assumed care of patient. Patient resting comfortably in bed with no signs of acute distress noted. Waiting on ready hospital bed. Pt currently has no complaints at this time.

## 2022-06-07 NOTE — H&P (Signed)
History and Physical    Patient: [name] [id number] DOB: 10/24/1980 DOA: 06/06/2022 DOS: the patient was seen and examined on 06/07/2022 PCP: Pcp, No  Patient coming from: SNF [name]  Chief Complaint:  Chief Complaint  Patient presents with   Fever   Cough   HPI: [name] is a 42 y.o. female with medical history significant of multiple sclerosis with paraplegia, remote DVT, depression and PTSD, urostomy with history of complicated UTI, obesity, right renal stone and GERD.  Patient presented to the ED after she was found to have fever at the skilled nursing facility.  Patient reports that she had been having a scratchy sore throat on Monday and later developed nasal congestion and cough.  On Tuesday night she developed shortness of breath and increased work of breathing and worsened with fever by Wednesday.  She was seen at the [facility] ER on 3/2 with complaint of back pain.  She was brought to the [facility], ER via EMS.  Was found to have a low-grade fever of 99.4.  She did not have any leukocytosis.  She was tachycardic but normotensive.  Her O2 sats on room air were 86%.  Her chest x-ray was unremarkable.  RSV PCR was positive.  Her urinalysis was abnormal in the context of known urostomy.  Hospitalist team was requested to evaluate the patient for admission.   Review of Systems: As per HPI  Past Medical History:  Diagnosis Date   Bladder stones    Hx of   Depression    GERD (gastroesophageal reflux disease)    Intertrigo 04/24/2017   Left upper extremity swelling 04/24/2017   Multiple sclerosis ([place])    Paraplegia (HCC)    Polycystic disease, ovaries    PTSD (post-traumatic stress disorder)    Transverse myelitis ([place])    Yeast infection 09/26/2017   Past Surgical History:  Procedure Laterality Date   BREAST BIOPSY Right 05/22/2022   [place] RT BREAST BX W LOC DEV 1ST LESION IMG BX SPEC US GUIDE 05/22/2022 [facility] MAMMOGRAPHY   CHOLECYSTECTOMY      CYSTECTOMY W/ URETEROILEAL CONDUIT     IR FLUORO GUIDE CV LINE LEFT  02/25/2017   IR NEPHROURETERAL CATH PLACE RIGHT  10/20/2021   IR US GUIDE VASC ACCESS LEFT  02/25/2017   IRRIGATION AND DEBRIDEMENT ABSCESS N/A 02/18/2017   Procedure: IRRIGATION AND DEBRIDEMENT ABSCESS;  Surgeon: Excell Seltzer, MD;  Location: [facility] ORS;  Service: General;  Laterality: N/A;   NEPHROLITHOTOMY Right 10/20/2021   Procedure: RIGHT NEPHROLITHOTOMY PERCUTANEOUS;  Surgeon: Irine Seal, MD;  Location: [facility] ORS;  Service: Urology;  Laterality: Right;   TONSILLECTOMY     WISDOM TOOTH EXTRACTION     Social History:  reports that she has never smoked. She has never used smokeless tobacco. She reports that she does not drink alcohol and does not use drugs.  Allergies  Allergen Reactions   Iodine Anaphylaxis   Latex Anaphylaxis   Other Other (See Comments)    Documented on Order Summary Report - unknown reaction Apricot, collard greens, green bean, melon, peaches, turnip greens   Polymyxin B Hives    Pt had hives in right arm with infusion of polymyxin on November 26th, 2018   Shellfish-Derived Products Anaphylaxis    All seafood   Vancomycin Anaphylaxis   Iodinated Contrast Media Hives    Patient requires 13 hour  prep prior to administration   Strawberry Extract Hives and Rash    Family History  Problem  Relation Age of Onset   Diabetes type II Father    CAD Father    Dementia Father    Stroke Father    Seizures Father    Diabetes type II Mother    Hypertension Mother    Breast cancer Other    Diabetes type II Other     Prior to Admission medications   Medication Sig Start Date End Date Taking? Authorizing Provider  acetaminophen (TYLENOL) 325 MG tablet Take 650 mg by mouth every 4 (four) hours as needed for headache, mild pain or fever.    [provider]  acetaminophen-codeine (TYLENOL #3) 300-30 MG tablet Take by mouth every 6 (six) hours as needed for moderate pain or severe pain. DO  NOT EXCEED 4000 MG OF TYLENOL IN A DAY    [provider]  baclofen (LIORESAL) 20 MG tablet Take 1 tablet (20 mg total) by mouth 4 (four) times daily. 02/13/19   Pieter Partridge, DO  diazepam (VALIUM) 5 MG tablet Take 1-2 tablets (5-10 mg total) by mouth 2 (two) times daily. Take 1 tablet (5 mg) BID and Take 2 tablets (10 mg) at bedtime Patient taking differently: Take 5-10 mg by mouth See admin instructions. Take 5 mg by mouth in the morning and 10 mg at bedtime 03/12/18   Aline August, MD  fluticasone (FLONASE) 50 MCG/ACT nasal spray Place 1 spray into the nose daily.    [provider]  gabapentin (NEURONTIN) 300 MG capsule Take 3 caps ( 900 mg) Three times a day 01/30/22   Metta Clines R, DO  LACTOBACILLUS PO Take 1 capsule by mouth daily.    [provider]  lamoTRIgine (LAMICTAL) 25 MG tablet Take 2 tablets (50 mg total) by mouth daily. 04/03/17   Bonnielee Haff, MD  loratadine (CLARITIN) 10 MG tablet Take 10 mg by mouth daily.    [provider]  menthol-cetylpyridinium (CEPACOL) 3 MG lozenge Take 1 lozenge by mouth every 4 (four) hours as needed for sore throat.    [provider]  Multiple Vitamins-Minerals (MULTIVITAMIN WITH MINERALS) tablet Take 1 tablet by mouth daily.    [provider]  nystatin cream (MYCOSTATIN) Apply 1 application  topically at bedtime as needed (vaginal). Apply to vaginal area    [provider]  OLANZapine (ZYPREXA) 2.5 MG tablet Take 2.5 mg by mouth daily.    [provider]  omeprazole (PRILOSEC) 20 MG capsule Take 20 mg by mouth daily.    [provider]  ondansetron (ZOFRAN) 4 MG tablet Take 4 mg by mouth every 8 (eight) hours as needed for nausea or vomiting.    [provider]  ondansetron (ZOFRAN-ODT) 4 MG disintegrating tablet Take 1 tablet (4 mg total) by mouth every 8 (eight) hours as needed for nausea or vomiting. 06/02/22   Tedd Sias, PA  sertraline (ZOLOFT) 100  MG tablet Take 1 tablet (100 mg total) by mouth 2 (two) times daily. 04/03/17   Bonnielee Haff, MD  urea (CARMOL) 20 % cream Apply 1 application  topically daily. Apply to both feet.    [provider]    Physical Exam: Vitals:   06/07/22 0430 06/07/22 0445 06/07/22 0500 06/07/22 0600  BP: 115/64 113/73 120/70 117/65  Pulse: 91 93 92 93  Resp:  '14 16 18  '$ Temp:  98.9 F (37.2 C)  98.9 F (37.2 C)  TempSrc:  Oral  Oral  SpO2: 96% 96% 96% 96%  Weight:  Height:       Constitutional: NAD, calm, comfortable Eyes: PERRL, lids and conjunctivae normal although sclera slightly injected ENMT: Mucous membranes are moist. Posterior pharynx clear of any exudate or lesions.Normal dentition.  Patient with audible nasal congestion and clear sinus drainage as well as clear expectorated Neck: normal, supple, no masses, no thyromegaly Respiratory: Coarse wheezing especially on the right bilaterally.  No crackles. Normal respiratory effort. No accessory muscle use.  2 L nasal cannula oxygen Cardiovascular: Regular rate and rhythm, no murmurs / rubs / gallops. No extremity edema. 2+ pedal pulses. No carotid bruits.  Abdomen: no tenderness, no masses palpated. No hepatosplenomegaly. Bowel sounds positive.  Musculoskeletal: no clubbing / cyanosis. No joint deformity upper and lower extremities. Good ROM, no contractures. Normal muscle tone.  Skin: Mildly diaphoretic, has chronic bilateral heel wounds covered by colloid dressings Neurologic: CN 2-12 grossly intact. Sensation intact, . Strength 3/5 upper extremities.  Patient 9 paraplegic and is bedbound in context of her underlying multiple sclerosis Psychiatric: Normal judgment and insight. Alert and oriented x 3. Normal mood.   Data Reviewed:  Initial sodium 144, potassium 3.7, CO2 25, glucose 148, BUN 10, creatinine 0.48, AST 84, ALT 61 with normal bilirubin, initial lactic acid 2.1 and decreased to 0.8 after administration fluid bolus, white  count 7600 with hemoglobin 9.7, hematocrit 33 with MCV 75.9 platelets 232,000, neutrophils 92%, urinalysis highly abnormal with cloudy appearance, large leukocytes, positive nitrite, 30 of protein, 21-50 WBCs but patient specimen collected from urostomy.  Blood cultures have been obtained.  Urine pregnancy was negative  Assessment and Plan: Acute respiratory failure with hypoxemia secondary to RSV Likely symptoms solely related to viral process but as a precaution will initiate Rocephin and Zmax and ck urinary strep, legionella, CRP and PCT If negative for infection can dc antibiotics Scheduled duonebs x 24hrs then 6 hrs prn Budesonide nebs q 12hrs  MS w/paraplegia/chronic heel wounds and urostomy DC plan is to return to SNF [facility] for recs re heel wounds Continue preadmit baclofen,valium,neurontin  Remote DVT Not on AC prior to admit Lovenox for DVT prophylaxis  Depression w/ PTSD Cont preadmit Zyprexa and lamictal     Advance Care Planning:   Code Status: Full Code   DVT prophylaxis: Lovenox  Consults: None  Family Communication: Pt only  Severity of Illness: The appropriate patient status for this patient is INPATIENT. Inpatient status is judged to be reasonable and necessary in order to provide the required intensity of service to ensure the patient's safety. The patient's presenting symptoms, physical exam findings, and initial radiographic and laboratory data in the context of their chronic comorbidities is felt to place them at high risk for further clinical deterioration. Furthermore, it is not anticipated that the patient will be medically stable for discharge from the hospital within 2 midnights of admission.   * I certify that at the point of admission it is my clinical judgment that the patient will require inpatient hospital care spanning beyond 2 midnights from the point of admission due to high intensity of service, high risk for further deterioration and high  frequency of surveillance required.*  Author: Erin Hearing, NP 06/07/2022 6:32 AM  For on call review [email or web address].

## 2022-06-07 NOTE — Consult Note (Addendum)
[facility] Nurse Consult Note: Reason for Consult: Consult requested for bilat feet.  Pt states she has chronic wounds which are improving.  Left plantar outer heel with dark purple red Deep tissue pressure injury; .5X.5cm Left plantar heel with full thickness wound; .2X1X.2cm, red and moist Right plantar heel with full thickness wound; .5X.5X.2cm, red and moist Pressure Injury POA: Yes Dressing procedure/placement/frequency: Pt has been using a hydrocolloid dressing prior to admission. Heels are floated in pressure reducing boots. Foam dressings applied so assessments can be performed.  Topical treatment orders provided for bedside nurses to perform as follows: Foam dressing to bilat heels, change Q 3 days or PRN soiling.   WOC ostomy Consult note: Pt has a urostomy which has been present several years.  Pouch is intact with good seal and is connected to bedside drainage bag.  Stoma is red and viable when visualized through the pouch.  Pt states pouch was changed yesterday, and she uses a convex one piece urostomy pouch.  Extra barrier ring and pouch left at the bedside for staff nurse use. Use supplies: barrier ring, [place] # [phone] and one piece convex pouch Lawson # [phone] Please re-consult if further assistance is needed.  Thank-you,  Julien Girt MSN, [place], [place], [place], [place]

## 2022-06-08 DIAGNOSIS — B338 Other specified viral diseases: Secondary | ICD-10-CM | POA: Diagnosis not present

## 2022-06-08 LAB — COMPREHENSIVE METABOLIC PANEL
ALT: 68 U/L — ABNORMAL HIGH (ref 0–44)
AST: 76 U/L — ABNORMAL HIGH (ref 15–41)
Albumin: 3.8 g/dL (ref 3.5–5.0)
Alkaline Phosphatase: 58 U/L (ref 38–126)
Anion gap: 11 (ref 5–15)
BUN: 8 mg/dL (ref 6–20)
CO2: 25 mmol/L (ref 22–32)
Calcium: 8.5 mg/dL — ABNORMAL LOW (ref 8.9–10.3)
Chloride: 103 mmol/L (ref 98–111)
Creatinine, Ser: 0.36 mg/dL — ABNORMAL LOW (ref 0.44–1.00)
GFR, Estimated: >60 mL/min (ref >60)
Glucose, Bld: 114 mg/dL — ABNORMAL HIGH (ref 70–99)
Potassium: 3.7 mmol/L (ref 3.5–5.1)
Sodium: 139 mmol/L (ref 135–145)
Total Bilirubin: 0.5 mg/dL (ref 0.3–1.2)
Total Protein: 6.7 g/dL (ref 6.5–8.1)

## 2022-06-08 LAB — CBC
HCT: 34 % — ABNORMAL LOW (ref 36.0–46.0)
Hemoglobin: 9.3 g/dL — ABNORMAL LOW (ref 12.0–15.0)
MCH: 21.7 pg — ABNORMAL LOW (ref 26.0–34.0)
MCHC: 27.4 g/dL — ABNORMAL LOW (ref 30.0–36.0)
MCV: 79.4 fL — ABNORMAL LOW (ref 80.0–100.0)
Platelets: 228 10*3/uL (ref 150–400)
RBC: 4.28 MIL/uL (ref 3.87–5.11)
RDW: 18.7 % — ABNORMAL HIGH (ref 11.5–15.5)
WBC: 6.5 10*3/uL (ref 4.0–10.5)
nRBC: 0 % (ref 0.0–0.2)

## 2022-06-08 MED ORDER — METHYLPREDNISOLONE SODIUM SUCC 40 MG IJ SOLR
40.0000 mg | Freq: Two times a day (BID) | INTRAMUSCULAR | Status: DC
  Administered 2022-06-08 – 2022-06-11 (×6): 40 mg via INTRAVENOUS
  Filled 2022-06-08 (×6): qty 1

## 2022-06-08 MED ORDER — IPRATROPIUM-ALBUTEROL 0.5-2.5 (3) MG/3ML IN SOLN
3.0000 mL | Freq: Four times a day (QID) | RESPIRATORY_TRACT | Status: DC
  Administered 2022-06-08 – 2022-06-13 (×19): 3 mL via RESPIRATORY_TRACT
  Filled 2022-06-08 (×20): qty 3

## 2022-06-08 MED ORDER — SALINE SPRAY 0.65 % NA SOLN
1.0000 | NASAL | Status: DC | PRN
  Administered 2022-06-08: 1 via NASAL
  Filled 2022-06-08: qty 44

## 2022-06-08 NOTE — Plan of Care (Signed)

## 2022-06-08 NOTE — Progress Notes (Signed)
Both upper extremities were assessed with [place] and only one superficial vein was found for PIV in upper left arm.  If further access is needed in the future then this pt will likely require a PICC or other central access.  Primary RN is aware.

## 2022-06-08 NOTE — Progress Notes (Signed)
PROGRESS NOTE  [name]  [id number] DOB: 1980/10/09 DOA: 06/06/2022 PCP: Pcp, No   Brief Narrative:  Patient is a 42 year old female with history Transverse myelitis with paraplegia, urostomy, complicated UTI, remote DVT, depression, PTSD, obesity who presented from skilled nursing facility with complaint of fever, cough, shortness of breath.  On presentation she was tachypneic, requiring 2 to 3 L of oxygen per minute.  RSV PCR came to be positive.  Assessment & Plan:  Principal Problem:   RSV infection Active Problems:   Multiple sclerosis ([place])   Paraplegia (HCC)   Obesity (BMI 30-39.9)   Acute respiratory failure with hypoxia (HCC)  Acute respiratory failure with hypoxia secondary to RSV: Presented with cough, shortness of breath, fever.  Wheezing, tachypnea on presentation.  Requiring 2 to 3 L of oxygen per minute. Continue supplemental oxygen as needed, bronchodilators, steroids. Procalcitonin nonreassuring.  Chest x-ray did not show any evidence of pneumonia.  No leukocytosis. Continues to have cough, wheezing.  Continue bronchodilators.  Will continue current antibiotics for now.  Chest   Therapy ordered.  Continue steroids as well.  History of transverse myelitis with paraplegia: Resides at skilled nursing facility.  Has chronic heel wounds.  Has urostomy.  Wound care consulted.  On baclofen, Valium, Neurontin  Depression/PTSD: On Zyprexa, Lamictal  Abnormal UA: Urine was cloudy with leukocytes and positive nitrite.  No leukocytosis.  Fever is most likely from viral infection.  Urine looks very clean in the back.  Will defer sending urine culture       Pressure Injury Heel Left Deep Tissue Pressure Injury - Purple or maroon localized area of discolored intact skin or blood-filled blister due to damage of underlying soft tissue from pressure and/or shear. (Active)     Location: Heel  Location Orientation: Left  Staging: Deep Tissue Pressure Injury -  Purple or maroon localized area of discolored intact skin or blood-filled blister due to damage of underlying soft tissue from pressure and/or shear.  Wound Description (Comments):   Present on Admission: Yes  Dressing Type Foam - Lift dressing to assess site every shift 06/07/22 2000     Pressure Injury 06/07/22 Sacrum (Active)  06/07/22 1612  Location: Sacrum  Location Orientation:   Staging:   Wound Description (Comments):   Present on Admission: Yes  Dressing Type Foam - Lift dressing to assess site every shift 06/07/22 2000    DVT prophylaxis:enoxaparin (LOVENOX) injection 40 mg Start: 06/07/22 1000 SCDs Start: 06/07/22 0250     Code Status: Full Code  Family Communication: Mother at bedside  Patient status:Inpatient  Patient is from :SNF  Anticipated discharge to:SNF  Estimated DC date:1-2 days   Consultants: None  Procedures:None  Antimicrobials:  Anti-infectives (From admission, onward)    Start     Dose/Rate Route Frequency Ordered Stop   06/07/22 1200  cefTRIAXone (ROCEPHIN) 1 g in sodium chloride 0.9 % 100 mL IVPB        1 g 200 mL/hr over 30 Minutes Intravenous Every 24 hours 06/07/22 1104     06/07/22 1200  azithromycin (ZITHROMAX) 500 mg in sodium chloride 0.9 % 250 mL IVPB        500 mg 250 mL/hr over 60 Minutes Intravenous Every 24 hours 06/07/22 1104         Subjective: Patient seen and examined at bedside today.  Hemodynamically stable.  Coughing.  On 2 to 3 L of oxygen per minute.  Denies any worsening shortness of breath or cough but still  feels the same like yesterday.  Mother at bedside.  Objective: Vitals:   06/07/22 2336 06/08/22 0334 06/08/22 0500 06/08/22 0540  BP: (!) 112/57 106/65    Pulse: 91 85  99  Resp: '16 16  18  '$ Temp: 98 F (36.7 C)     TempSrc: Oral Oral    SpO2: 99% 96%  91%  Weight:   91 kg   Height:        Intake/Output Summary (Last 24 hours) at 06/08/2022 0754 Last data filed at 06/08/2022 0630 Gross per 24 hour   Intake 486.53 ml  Output 1450 ml  Net -963.47 ml   Filed Weights   06/06/22 2308 06/08/22 0500  Weight: 85.3 kg 91 kg    Examination:  General exam: Deconditioned, weak, debilitated HEENT: PERRL Respiratory system: Bilateral rhonchi, wheezing Cardiovascular system: S1 & S2 heard, RRR.  Gastrointestinal system: Abdomen is nondistended, soft and nontender.  Urostomy Central nervous system: Alert and oriented Extremities: Contractures of lower extremities Skin: Heel ulcers   Data Reviewed: I have personally reviewed following labs and imaging studies  CBC: Recent Labs  Lab 06/06/22 2315 06/07/22 0415 06/08/22 0620  WBC 7.6 6.0 6.5  NEUTROABS 6.9 5.0  --   HGB 9.7* 9.3* 9.3*  HCT 33.0* 32.2* 34.0*  MCV 75.9* 76.8* 79.4*  PLT 232 208 [id number]   Basic Metabolic Panel: Recent Labs  Lab 06/06/22 2315 06/07/22 0415 06/08/22 0620  NA 134* 138 139  K 3.7 3.9 3.7  CL 101 102 103  CO2 '25 27 25  '$ GLUCOSE 148* 117* 114*  BUN '10 9 8  '$ CREATININE 0.48 0.43* 0.36*  CALCIUM 8.6* 8.4* 8.5*  MG  --  2.6*  --   PHOS  --  3.2  --      Recent Results (from the past 240 hour(s))  Culture, blood (Routine x 2)     Status: None (Preliminary result)   Collection Time: 06/06/22 11:15 PM   Specimen: Right Antecubital; Blood  Result Value Ref Range Status   Specimen Description   Final    RIGHT ANTECUBITAL BLOOD Performed at [facility], [address], [place], [place] [address]    Special Requests   Final    BOTTLES DRAWN AEROBIC AND ANAEROBIC Blood Culture results may not be optimal due to an inadequate volume of blood received in culture bottles Performed at [facility] [address], [place], [place] [address]    Culture   Final    NO GROWTH 1 DAY Performed at [facility], [place] [address] [place], [place] [address]    Report Status PENDING  Incomplete  Resp panel by RT-PCR (RSV, Flu A&B, Covid) Anterior Nasal Swab     Status: Abnormal    Collection Time: 06/06/22 11:15 PM   Specimen: Anterior Nasal Swab  Result Value Ref Range Status   SARS Coronavirus 2 by RT PCR NEGATIVE NEGATIVE Final    Comment: (NOTE) SARS-CoV-2 target nucleic acids are NOT DETECTED.  The SARS-CoV-2 RNA is generally detectable in upper respiratory specimens during the acute phase of infection. The lowest concentration of SARS-CoV-2 viral copies this assay can detect is 138 copies/mL. A negative result does not preclude SARS-Cov-2 infection and should not be used as the sole basis for treatment or other patient management decisions. A negative result may occur with  improper specimen collection/handling, submission of specimen other than nasopharyngeal swab, presence of viral mutation(s) within the areas targeted by this assay,  and inadequate number of viral copies(<138 copies/mL). A negative result must be combined with clinical observations, patient history, and epidemiological information. The expected result is Negative.  Fact Sheet for Patients:  EntrepreneurPulse.com.au  Fact Sheet for Healthcare Providers:  IncredibleEmployment.be  This test is no t yet approved or cleared by the [place] FDA and  has been authorized for detection and/or diagnosis of SARS-CoV-2 by FDA under an Emergency Use Authorization (EUA). This EUA will remain  in effect (meaning this test can be used) for the duration of the COVID-19 declaration under Section 564(b)(1) of the Act, 21 U.S.C.section 360bbb-3(b)(1), unless the authorization is terminated  or revoked sooner.       Influenza A by PCR NEGATIVE NEGATIVE Final   Influenza B by PCR NEGATIVE NEGATIVE Final    Comment: (NOTE) The Xpert Xpress SARS-CoV-2/FLU/RSV plus assay is intended as an aid in the diagnosis of influenza from Nasopharyngeal swab specimens and should not be used as a sole basis for treatment. Nasal washings and aspirates are unacceptable for  Xpert Xpress SARS-CoV-2/FLU/RSV testing.  Fact Sheet for Patients: EntrepreneurPulse.com.au  Fact Sheet for Healthcare Providers: IncredibleEmployment.be  This test is not yet approved or cleared by the [place] FDA and has been authorized for detection and/or diagnosis of SARS-CoV-2 by FDA under an Emergency Use Authorization (EUA). This EUA will remain in effect (meaning this test can be used) for the duration of the COVID-19 declaration under Section 564(b)(1) of the Act, 21 U.S.C. section 360bbb-3(b)(1), unless the authorization is terminated or revoked.     Resp Syncytial Virus by PCR POSITIVE (A) NEGATIVE Final    Comment: (NOTE) Fact Sheet for Patients: EntrepreneurPulse.com.au  Fact Sheet for Healthcare Providers: IncredibleEmployment.be  This test is not yet approved or cleared by the [place] FDA and has been authorized for detection and/or diagnosis of SARS-CoV-2 by FDA under an Emergency Use Authorization (EUA). This EUA will remain in effect (meaning this test can be used) for the duration of the COVID-19 declaration under Section 564(b)(1) of the Act, 21 U.S.C. section 360bbb-3(b)(1), unless the authorization is terminated or revoked.  Performed at [facility], [place] [address], [place], [place] [address]   Culture, blood (Routine x 2)     Status: None (Preliminary result)   Collection Time: 06/06/22 11:50 PM   Specimen: BLOOD LEFT FOREARM  Result Value Ref Range Status   Specimen Description   Final    BLOOD LEFT FOREARM Performed at [facility] [address] [place], [place] [address]    Special Requests   Final    BOTTLES DRAWN AEROBIC AND ANAEROBIC Blood Culture adequate volume Performed at [facility] [address] [place], [place] [address]    Culture   Final    NO GROWTH 1 DAY Performed at [facility], [place] [address], [place], [place] [address]    Report Status PENDING  Incomplete     Radiology Studies: DG Chest Port 1 View  Result Date: 06/06/2022 CLINICAL DATA:  Shortness of breath, cough EXAM: PORTABLE CHEST 1 VIEW COMPARISON:  10/23/2021 FINDINGS: Lungs are clear.  No pleural effusion or pneumothorax. The heart is normal in size. IMPRESSION: No evidence of acute cardiopulmonary disease. Electronically Signed   By: Julian Hy M.D.   On: 06/06/2022 23:37    Scheduled Meds:  acidophilus  1 capsule Oral Daily   ascorbic acid  500 mg Oral Daily   baclofen  20 mg Oral  QID   budesonide  0.5 mg Nebulization BID   diazepam  5 mg Oral QAC breakfast   diazepam  5 mg Oral QHS   enoxaparin (LOVENOX) injection  40 mg Subcutaneous Q24H   gabapentin  900 mg Oral TID   guaiFENesin  600 mg Oral BID   ipratropium-albuterol  3 mL Nebulization TID   lamoTRIgine  100 mg Oral BID   methylPREDNISolone (SOLU-MEDROL) injection  40 mg Intravenous Daily   OLANZapine  2.5 mg Oral QHS   pantoprazole  40 mg Oral Daily   sertraline  100 mg Oral Daily   sodium chloride flush  3 mL Intravenous Q12H   zinc sulfate  220 mg Oral Daily   Continuous Infusions:  sodium chloride 50 mL/hr at 06/07/22 1218   azithromycin (ZITHROMAX) 500 mg in sodium chloride 0.9 % 250 mL IVPB Stopped (06/07/22 1410)   cefTRIAXone (ROCEPHIN)  IV Stopped (06/07/22 1320)     LOS: 1 day   Shelly Coss, MD [facility] P3/11/2022, 7:54 AM

## 2022-06-08 NOTE — Progress Notes (Signed)
Dear Doctor:   This patient has been identified as a candidate for PICC for the following reason (s): poor veins/poor circulatory system (CHF, COPD, emphysema, diabetes, steroid use, IV drug abuse, etc.) and restarts due to phlebitis and infiltration in 24 hours If you agree, please write an order for the indicated device. For any questions contact the Vascular Access Team at [phone] if no answer, please leave a message.  Thank you for supporting the early vascular access assessment program.

## 2022-06-09 DIAGNOSIS — B338 Other specified viral diseases: Secondary | ICD-10-CM | POA: Diagnosis not present

## 2022-06-09 LAB — COMPREHENSIVE METABOLIC PANEL
ALT: 60 U/L — ABNORMAL HIGH (ref 0–44)
AST: 50 U/L — ABNORMAL HIGH (ref 15–41)
Albumin: 3.7 g/dL (ref 3.5–5.0)
Alkaline Phosphatase: 55 U/L (ref 38–126)
Anion gap: 10 (ref 5–15)
BUN: 9 mg/dL (ref 6–20)
CO2: 27 mmol/L (ref 22–32)
Calcium: 8.8 mg/dL — ABNORMAL LOW (ref 8.9–10.3)
Chloride: 103 mmol/L (ref 98–111)
Creatinine, Ser: 0.37 mg/dL — ABNORMAL LOW (ref 0.44–1.00)
GFR, Estimated: >60 mL/min (ref >60)
Glucose, Bld: 153 mg/dL — ABNORMAL HIGH (ref 70–99)
Potassium: 3.4 mmol/L — ABNORMAL LOW (ref 3.5–5.1)
Sodium: 140 mmol/L (ref 135–145)
Total Bilirubin: 0.4 mg/dL (ref 0.3–1.2)
Total Protein: 6.8 g/dL (ref 6.5–8.1)

## 2022-06-09 NOTE — Progress Notes (Signed)
PROGRESS NOTE  [name]  [id number] DOB: March 24, 1981 DOA: 06/06/2022 PCP: Pcp, No   Brief Narrative:  Patient is a 42 year old female with history Transverse myelitis with paraplegia,bedbound, urostomy, complicated UTI, remote DVT, depression, PTSD, obesity who presented from skilled nursing facility with complaint of fever, cough, shortness of breath.  On presentation, she was tachypneic, requiring 2 to 3 L of oxygen per minute.  RSV PCR came to be positive.  Hospital course remarkable for persistent requirement of oxygen for maintenance of saturation.  Overall condition getting better.  Assessment & Plan:  Principal Problem:   RSV infection Active Problems:   Multiple sclerosis ([place])   Paraplegia (H[facility])   Obesity (BMI 30-39.9)   Acute respiratory failure with hypoxia (HCC)  Acute respiratory failure with hypoxia secondary to RSV: Presented with cough, shortness of breath, fever.  Wheezing, tachypnea on presentation.  Requiring 2 to 3 L of oxygen per minute. Continue supplemental oxygen as needed, bronchodilators, steroids.We are attempting to wean the oxygen.  She is not on oxygen at baseline. Procalcitonin nonreassuring.  Chest x-ray did not show any evidence of pneumonia.  No leukocytosis. Cough  have improved.  Mild wheezing persist .continue bronchodilators.  Will continue current antibiotics for now. Elevated CRP. Continue steroids as well.  History of transverse myelitis with paraplegia: Resides at skilled nursing facility.  Has chronic heel wounds.Bedbound.  Has urostomy.  Wound care consulted.  On baclofen, Valium, Neurontin  Depression/PTSD: On Zyprexa, Lamictal  Elevated liver enzymes: Mild, most likely associated with  viral illness.  Microcytic anemia: Current hemoglobin stable in the range of 9.  Abnormal UA: Urine was cloudy with leukocytes and positive nitrite.  No leukocytosis.  Fever is most likely from viral infection.  Urine looked very clean in  the back. Deffered sending urine culture       Pressure Injury Heel Left Deep Tissue Pressure Injury - Purple or maroon localized area of discolored intact skin or blood-filled blister due to damage of underlying soft tissue from pressure and/or shear. (Active)     Location: Heel  Location Orientation: Left  Staging: Deep Tissue Pressure Injury - Purple or maroon localized area of discolored intact skin or blood-filled blister due to damage of underlying soft tissue from pressure and/or shear.  Wound Description (Comments):   Present on Admission: Yes  Dressing Type Foam - Lift dressing to assess site every shift 06/08/22 2014     Pressure Injury 06/07/22 Sacrum (Active)  06/07/22 1612  Location: Sacrum  Location Orientation:   Staging:   Wound Description (Comments):   Present on Admission: Yes  Dressing Type Foam - Lift dressing to assess site every shift 06/08/22 2014    DVT prophylaxis:enoxaparin (LOVENOX) injection 40 mg Start: 06/07/22 1000 SCDs Start: 06/07/22 0250     Code Status: Full Code  Family Communication: Mother at bedside  Patient status:Inpatient  Patient is from :SNF  Anticipated discharge to:SNF  Estimated DC date:1-2 days   Consultants: None  Procedures:None  Antimicrobials:  Anti-infectives (From admission, onward)    Start     Dose/Rate Route Frequency Ordered Stop   06/07/22 1200  cefTRIAXone (ROCEPHIN) 1 g in sodium chloride 0.9 % 100 mL IVPB        1 g 200 mL/hr over 30 Minutes Intravenous Every 24 hours 06/07/22 1104     06/07/22 1200  azithromycin (ZITHROMAX) 500 mg in sodium chloride 0.9 % 250 mL IVPB  Status:  Discontinued        500  mg 250 mL/hr over 60 Minutes Intravenous Every 24 hours 06/07/22 1104 06/09/22 0902       Subjective: Patient seen and examined at bedside today.  Hemodynamically stable.  She looks better than yesterday.  Less coughing.  Not congested like yesterday.  She still has some wheezing.  On 2 L of  oxygen .  Complains of nausea today  Objective: Vitals:   06/08/22 2013 06/08/22 2014 06/09/22 0540 06/09/22 0815  BP:   (!) 108/56   Pulse:   83   Resp:   16   Temp:   97.6 F (36.4 C)   TempSrc:   Oral   SpO2: 95% 95% 95% 95%  Weight:      Height:        Intake/Output Summary (Last 24 hours) at 06/09/2022 1041 Last data filed at 06/09/2022 1039 Gross per 24 hour  Intake 882.9 ml  Output 1850 ml  Net -967.1 ml   Filed Weights   06/06/22 2308 06/08/22 0500  Weight: 85.3 kg 91 kg    Examination:  General exam: Deconditioned, chronically ill looking HEENT: PERRL Respiratory system: Mild bilateral expiratory wheezing Cardiovascular system: S1 & S2 heard, RRR.  Gastrointestinal system: Abdomen is nondistended, soft and nontender.  Urostomy Central nervous system: Alert and oriented Extremities: Contractures of lower extremities Skin: Heel ulcers   Data Reviewed: I have personally reviewed following labs and imaging studies  CBC: Recent Labs  Lab 06/06/22 2315 06/07/22 0415 06/08/22 0620  WBC 7.6 6.0 6.5  NEUTROABS 6.9 5.0  --   HGB 9.7* 9.3* 9.3*  HCT 33.0* 32.2* 34.0*  MCV 75.9* 76.8* 79.4*  PLT 232 208 [id number]   Basic Metabolic Panel: Recent Labs  Lab 06/06/22 2315 06/07/22 0415 06/08/22 0620  NA 134* 138 139  K 3.7 3.9 3.7  CL 101 102 103  CO2 '25 27 25  '$ GLUCOSE 148* 117* 114*  BUN '10 9 8  '$ CREATININE 0.48 0.43* 0.36*  CALCIUM 8.6* 8.4* 8.5*  MG  --  2.6*  --   PHOS  --  3.2  --      Recent Results (from the past 240 hour(s))  Culture, blood (Routine x 2)     Status: None (Preliminary result)   Collection Time: 06/06/22 11:15 PM   Specimen: Right Antecubital; Blood  Result Value Ref Range Status   Specimen Description   Final    RIGHT ANTECUBITAL BLOOD Performed at [facility], [address] [place], [place] [address]    Special Requests   Final    BOTTLES DRAWN AEROBIC AND ANAEROBIC Blood Culture results may not be optimal due to an  inadequate volume of blood received in culture bottles Performed at [facility] [address] [place], [place] [address]    Culture   Final    NO GROWTH 2 DAYS Performed at [facility] [address], [place], [place] [address]    Report Status PENDING  Incomplete  Resp panel by RT-PCR (RSV, Flu A&B, Covid) Anterior Nasal Swab     Status: Abnormal   Collection Time: 06/06/22 11:15 PM   Specimen: Anterior Nasal Swab  Result Value Ref Range Status   SARS Coronavirus 2 by RT PCR NEGATIVE NEGATIVE Final    Comment: (NOTE) SARS-CoV-2 target nucleic acids are NOT DETECTED.  The SARS-CoV-2 RNA is generally detectable in upper respiratory specimens during the acute phase of infection. The lowest concentration of SARS-CoV-2 viral copies this assay can detect  is 138 copies/mL. A negative result does not preclude SARS-Cov-2 infection and should not be used as the sole basis for treatment or other patient management decisions. A negative result may occur with  improper specimen collection/handling, submission of specimen other than nasopharyngeal swab, presence of viral mutation(s) within the areas targeted by this assay, and inadequate number of viral copies(<138 copies/mL). A negative result must be combined with clinical observations, patient history, and epidemiological information. The expected result is Negative.  Fact Sheet for Patients:  EntrepreneurPulse.com.au  Fact Sheet for Healthcare Providers:  IncredibleEmployment.be  This test is no t yet approved or cleared by the [place] FDA and  has been authorized for detection and/or diagnosis of SARS-CoV-2 by FDA under an Emergency Use Authorization (EUA). This EUA will remain  in effect (meaning this test can be used) for the duration of the COVID-19 declaration under Section 564(b)(1) of the Act, 21 U.S.C.section 360bbb-3(b)(1), unless the authorization is  terminated  or revoked sooner.       Influenza A by PCR NEGATIVE NEGATIVE Final   Influenza B by PCR NEGATIVE NEGATIVE Final    Comment: (NOTE) The Xpert Xpress SARS-CoV-2/FLU/RSV plus assay is intended as an aid in the diagnosis of influenza from Nasopharyngeal swab specimens and should not be used as a sole basis for treatment. Nasal washings and aspirates are unacceptable for Xpert Xpress SARS-CoV-2/FLU/RSV testing.  Fact Sheet for Patients: EntrepreneurPulse.com.au  Fact Sheet for Healthcare Providers: IncredibleEmployment.be  This test is not yet approved or cleared by the [place] FDA and has been authorized for detection and/or diagnosis of SARS-CoV-2 by FDA under an Emergency Use Authorization (EUA). This EUA will remain in effect (meaning this test can be used) for the duration of the COVID-19 declaration under Section 564(b)(1) of the Act, 21 U.S.C. section 360bbb-3(b)(1), unless the authorization is terminated or revoked.     Resp Syncytial Virus by PCR POSITIVE (A) NEGATIVE Final    Comment: (NOTE) Fact Sheet for Patients: EntrepreneurPulse.com.au  Fact Sheet for Healthcare Providers: IncredibleEmployment.be  This test is not yet approved or cleared by the [place] FDA and has been authorized for detection and/or diagnosis of SARS-CoV-2 by FDA under an Emergency Use Authorization (EUA). This EUA will remain in effect (meaning this test can be used) for the duration of the COVID-19 declaration under Section 564(b)(1) of the Act, 21 U.S.C. section 360bbb-3(b)(1), unless the authorization is terminated or revoked.  Performed at [facility], [place] [address] [place], [place] [address]   Culture, blood (Routine x 2)     Status: None (Preliminary result)   Collection Time: 06/06/22 11:50 PM   Specimen: BLOOD LEFT FOREARM  Result Value Ref Range Status   Specimen  Description   Final    BLOOD LEFT FOREARM Performed at [facility] [address] [place], [place] [address]    Special Requests   Final    BOTTLES DRAWN AEROBIC AND ANAEROBIC Blood Culture adequate volume Performed at [facility] [address] [place], [place] [address]    Culture   Final    NO GROWTH 2 DAYS Performed at [facility] [address], [place], [place] [address]    Report Status PENDING  Incomplete     Radiology Studies: No results found.  Scheduled Meds:  acidophilus  1 capsule Oral Daily   ascorbic acid  500 mg Oral Daily   baclofen  20 mg Oral QID   budesonide  0.5 mg Nebulization BID   diazepam  5 mg Oral QAC breakfast   diazepam  5 mg Oral QHS   enoxaparin (LOVENOX) injection  40 mg Subcutaneous Q24H   gabapentin  900 mg Oral TID   guaiFENesin  600 mg Oral BID   ipratropium-albuterol  3 mL Nebulization Q6H   lamoTRIgine  100 mg Oral BID   methylPREDNISolone (SOLU-MEDROL) injection  40 mg Intravenous Q12H   OLANZapine  2.5 mg Oral QHS   pantoprazole  40 mg Oral Daily   sertraline  100 mg Oral Daily   sodium chloride flush  3 mL Intravenous Q12H   zinc sulfate  220 mg Oral Daily   Continuous Infusions:  cefTRIAXone (ROCEPHIN)  IV Stopped (06/08/22 1300)     LOS: 2 days   Shelly Coss, MD [facility] P3/12/2022, 10:41 AM

## 2022-06-09 NOTE — Plan of Care (Signed)

## 2022-06-10 ENCOUNTER — Inpatient Hospital Stay (HOSPITAL_COMMUNITY): Payer: Medicaid Other

## 2022-06-10 DIAGNOSIS — B338 Other specified viral diseases: Secondary | ICD-10-CM | POA: Diagnosis not present

## 2022-06-10 MED ORDER — POTASSIUM CHLORIDE CRYS ER 20 MEQ PO TBCR
40.0000 meq | EXTENDED_RELEASE_TABLET | Freq: Once | ORAL | Status: AC
  Administered 2022-06-10: 40 meq via ORAL
  Filled 2022-06-10: qty 2

## 2022-06-10 MED ORDER — LACTULOSE 10 GM/15ML PO SOLN
10.0000 g | Freq: Three times a day (TID) | ORAL | Status: DC
  Administered 2022-06-10 – 2022-06-12 (×7): 10 g via ORAL
  Filled 2022-06-10 (×7): qty 15

## 2022-06-10 MED ORDER — SODIUM CHLORIDE 3 % IN NEBU
4.0000 mL | INHALATION_SOLUTION | Freq: Every day | RESPIRATORY_TRACT | Status: DC
  Administered 2022-06-10 – 2022-06-11 (×2): 4 mL via RESPIRATORY_TRACT
  Filled 2022-06-10 (×2): qty 4

## 2022-06-10 MED ORDER — SODIUM CHLORIDE 0.9% FLUSH: 10.0000 mL | INTRAVENOUS | Status: DC | PRN

## 2022-06-10 NOTE — Progress Notes (Signed)
PROGRESS NOTE  [name]  [id number] DOB: [birth year] DOA: 06/06/2022 PCP: Pcp, No   Brief Narrative:  Patient is a 42 year old female with history Transverse myelitis with paraplegia,bedbound, urostomy, complicated UTI, remote DVT, depression, PTSD, obesity who presented from skilled nursing facility with complaint of fever, cough, shortness of breath.  On presentation, she was tachypneic, requiring 2 to 3 L of oxygen per minute.  RSV PCR came to be positive.  Hospital course remarkable for persistent requirement of oxygen for maintenance of saturation, wheezing and cough.  Assessment & Plan:  Principal Problem:   RSV infection Active Problems:   Multiple sclerosis ([place])   Paraplegia (HCC)   Obesity (BMI 30-39.9)   Acute respiratory failure with hypoxia (HCC)  Acute respiratory failure with hypoxia secondary to RSV: Presented with cough, shortness of breath, fever.  Wheezing, tachypnea on presentation.  Started on  bronchodilators, steroids.We are attempting to wean the oxygen.  She is not on oxygen at baseline.This mrng she is on 1 L Procalcitonin nonreassuring.  Chest x-ray did not show any evidence of pneumonia.  No leukocytosis. Cough  have improved.  wheezing persist .continue bronchodilators.  Will continue current antibiotics for now. Elevated CRP. Continue steroids as well. Will add hypertonic saline nebulization and chest physiotherapy so that she can get rid of secretions.  Chest x-ray done this morning did not show any acute cardiopulmonary process.  History of transverse myelitis with paraplegia: Resides at skilled nursing facility.  Has chronic heel wounds.Bedbound.  Has urostomy.  Wound care consulted.  On baclofen, Valium, Neurontin  Depression/PTSD: On Zyprexa, Lamictal  Elevated liver enzymes: Mild, most likely associated with  viral illness.  Microcytic anemia: Current hemoglobin stable in the range of 9.  Hypokalemia: Supplemented with  potassium  Abnormal UA: Urine was cloudy with leukocytes and positive nitrite.  No leukocytosis.  Fever is most likely from viral infection.  Urine looked very clean in the back. Deffered sending urine culture  Constipation: Continue lactulose       Pressure Injury Heel Left Deep Tissue Pressure Injury - Purple or maroon localized area of discolored intact skin or blood-filled blister due to damage of underlying soft tissue from pressure and/or shear. (Active)     Location: Heel  Location Orientation: Left  Staging: Deep Tissue Pressure Injury - Purple or maroon localized area of discolored intact skin or blood-filled blister due to damage of underlying soft tissue from pressure and/or shear.  Wound Description (Comments):   Present on Admission: Yes  Dressing Type Foam - Lift dressing to assess site every shift 06/10/22 0951     Pressure Injury 06/07/22 Sacrum (Active)  06/07/22 1612  Location: Sacrum  Location Orientation:   Staging:   Wound Description (Comments):   Present on Admission: Yes  Dressing Type Foam - Lift dressing to assess site every shift 06/10/22 0951    DVT prophylaxis:enoxaparin (LOVENOX) injection 40 mg Start: 06/07/22 1000 SCDs Start: 06/07/22 0250     Code Status: Full Code  Family Communication: Mother at bedside on 3/9  Patient status:Inpatient  Patient is from :SNF  Anticipated discharge to:SNF  Estimated DC date:tomorrow   Consultants: None  Procedures:None  Antimicrobials:  Anti-infectives (From admission, onward)    Start     Dose/Rate Route Frequency Ordered Stop   06/07/22 1200  cefTRIAXone (ROCEPHIN) 1 g in sodium chloride 0.9 % 100 mL IVPB        1 g 200 mL/hr over 30 Minutes Intravenous Every 24 hours 06/07/22 1104  06/07/22 1200  azithromycin (ZITHROMAX) 500 mg in sodium chloride 0.9 % 250 mL IVPB  Status:  Discontinued        500 mg 250 mL/hr over 60 Minutes Intravenous Every 24 hours 06/07/22 1104 06/09/22 0902        Subjective: Patient seen and examined at bedside today.  Hemodynamically stable.  She is still coughing and bringing up a lot of phlegm.  Wheezing on auscultation.  On 1L  of oxygen, trying to wean.  Objective: Vitals:   06/09/22 2011 06/09/22 2012 06/10/22 0500 06/10/22 0807  BP:   110/75   Pulse:   94   Resp:   18   Temp:   98 F (36.7 C)   TempSrc:   Oral   SpO2: 100% 100% 100% 97%  Weight:      Height:        Intake/Output Summary (Last 24 hours) at 06/10/2022 1125 Last data filed at 06/10/2022 1000 Gross per 24 hour  Intake 490 ml  Output 2600 ml  Net -2110 ml   Filed Weights   06/06/22 2308 06/08/22 0500  Weight: 85.3 kg 91 kg    Examination:  General exam: Deconditioned, chronically ill looking, weak, obese HEENT: PERRL Respiratory system: Bilateral wheezing Cardiovascular system: S1 & S2 heard, RRR.  Gastrointestinal system: Abdomen is nondistended, soft and nontender.  Urostomy Central nervous system: Alert and oriented Extremities: Contractures of the lower extremities Skin: Healed ulcers   Data Reviewed: I have personally reviewed following labs and imaging studies  CBC: Recent Labs  Lab 06/06/22 2315 06/07/22 0415 06/08/22 0620  WBC 7.6 6.0 6.5  NEUTROABS 6.9 5.0  --   HGB 9.7* 9.3* 9.3*  HCT 33.0* 32.2* 34.0*  MCV 75.9* 76.8* 79.4*  PLT 232 208 [id number]   Basic Metabolic Panel: Recent Labs  Lab 06/06/22 2315 06/07/22 0415 06/08/22 0620 06/09/22 1035  NA 134* 138 139 140  K 3.7 3.9 3.7 3.4*  CL 101 102 103 103  CO2 '25 27 25 27  '$ GLUCOSE 148* 117* 114* 153*  BUN '10 9 8 9  '$ CREATININE 0.48 0.43* 0.36* 0.37*  CALCIUM 8.6* 8.4* 8.5* 8.8*  MG  --  2.6*  --   --   PHOS  --  3.2  --   --      Recent Results (from the past 240 hour(s))  Culture, blood (Routine x 2)     Status: None (Preliminary result)   Collection Time: 06/06/22 11:15 PM   Specimen: Right Antecubital; Blood  Result Value Ref Range Status   Specimen Description   Final     RIGHT ANTECUBITAL BLOOD Performed at [facility], [address], [place], [place] [address]    Special Requests   Final    BOTTLES DRAWN AEROBIC AND ANAEROBIC Blood Culture results may not be optimal due to an inadequate volume of blood received in culture bottles Performed at [facility] [address], [place], [place] [address]    Culture   Final    NO GROWTH 3 DAYS Performed at [facility], [place] [address] [place], [place] [address]    Report Status PENDING  Incomplete  Resp panel by RT-PCR (RSV, Flu A&B, Covid) Anterior Nasal Swab     Status: Abnormal   Collection Time: 06/06/22 11:15 PM   Specimen: Anterior Nasal Swab  Result Value Ref Range Status   SARS Coronavirus 2 by RT PCR NEGATIVE NEGATIVE Final    Comment: (  NOTE) SARS-CoV-2 target nucleic acids are NOT DETECTED.  The SARS-CoV-2 RNA is generally detectable in upper respiratory specimens during the acute phase of infection. The lowest concentration of SARS-CoV-2 viral copies this assay can detect is 138 copies/mL. A negative result does not preclude SARS-Cov-2 infection and should not be used as the sole basis for treatment or other patient management decisions. A negative result may occur with  improper specimen collection/handling, submission of specimen other than nasopharyngeal swab, presence of viral mutation(s) within the areas targeted by this assay, and inadequate number of viral copies(<138 copies/mL). A negative result must be combined with clinical observations, patient history, and epidemiological information. The expected result is Negative.  Fact Sheet for Patients:  EntrepreneurPulse.com.au  Fact Sheet for Healthcare Providers:  IncredibleEmployment.be  This test is no t yet approved or cleared by the [place] FDA and  has been authorized for detection and/or diagnosis of SARS-CoV-2 by FDA under an Emergency Use  Authorization (EUA). This EUA will remain  in effect (meaning this test can be used) for the duration of the COVID-19 declaration under Section 564(b)(1) of the Act, 21 U.S.C.section 360bbb-3(b)(1), unless the authorization is terminated  or revoked sooner.       Influenza A by PCR NEGATIVE NEGATIVE Final   Influenza B by PCR NEGATIVE NEGATIVE Final    Comment: (NOTE) The Xpert Xpress SARS-CoV-2/FLU/RSV plus assay is intended as an aid in the diagnosis of influenza from Nasopharyngeal swab specimens and should not be used as a sole basis for treatment. Nasal washings and aspirates are unacceptable for Xpert Xpress SARS-CoV-2/FLU/RSV testing.  Fact Sheet for Patients: EntrepreneurPulse.com.au  Fact Sheet for Healthcare Providers: IncredibleEmployment.be  This test is not yet approved or cleared by the [place] FDA and has been authorized for detection and/or diagnosis of SARS-CoV-2 by FDA under an Emergency Use Authorization (EUA). This EUA will remain in effect (meaning this test can be used) for the duration of the COVID-19 declaration under Section 564(b)(1) of the Act, 21 U.S.C. section 360bbb-3(b)(1), unless the authorization is terminated or revoked.     Resp Syncytial Virus by PCR POSITIVE (A) NEGATIVE Final    Comment: (NOTE) Fact Sheet for Patients: EntrepreneurPulse.com.au  Fact Sheet for Healthcare Providers: IncredibleEmployment.be  This test is not yet approved or cleared by the [place] FDA and has been authorized for detection and/or diagnosis of SARS-CoV-2 by FDA under an Emergency Use Authorization (EUA). This EUA will remain in effect (meaning this test can be used) for the duration of the COVID-19 declaration under Section 564(b)(1) of the Act, 21 U.S.C. section 360bbb-3(b)(1), unless the authorization is terminated or revoked.  Performed at [facility],  [place] [address], [place], [place] [address]   Culture, blood (Routine x 2)     Status: None (Preliminary result)   Collection Time: 06/06/22 11:50 PM   Specimen: BLOOD LEFT FOREARM  Result Value Ref Range Status   Specimen Description   Final    BLOOD LEFT FOREARM Performed at [facility] [address] [place], [place] [address]    Special Requests   Final    BOTTLES DRAWN AEROBIC AND ANAEROBIC Blood Culture adequate volume Performed at [facility] [address], [place], [place] [address]    Culture   Final    NO GROWTH 3 DAYS Performed at [facility], [place] [address], [place], [place] [address]    Report Status PENDING  Incomplete  Radiology Studies: DG CHEST PORT 1 VIEW  Result Date: 06/10/2022 CLINICAL DATA:  Wheezing.  Fever, cough, weakness and rales. EXAM: PORTABLE CHEST 1 VIEW COMPARISON:  06/06/2022. FINDINGS: Clear lungs. Stable cardiac and mediastinal contours. No pleural effusion or pneumothorax. Visualized bones and upper abdomen are unremarkable. IMPRESSION: No evidence of acute cardiopulmonary disease. Electronically Signed   By: Emmit Alexanders M.D.   On: 06/10/2022 10:37    Scheduled Meds:  acidophilus  1 capsule Oral Daily   ascorbic acid  500 mg Oral Daily   baclofen  20 mg Oral QID   budesonide  0.5 mg Nebulization BID   diazepam  5 mg Oral QAC breakfast   diazepam  5 mg Oral QHS   enoxaparin (LOVENOX) injection  40 mg Subcutaneous Q24H   gabapentin  900 mg Oral TID   guaiFENesin  600 mg Oral BID   ipratropium-albuterol  3 mL Nebulization Q6H   lactulose  10 g Oral TID   lamoTRIgine  100 mg Oral BID   methylPREDNISolone (SOLU-MEDROL) injection  40 mg Intravenous Q12H   OLANZapine  2.5 mg Oral QHS   pantoprazole  40 mg Oral Daily   sertraline  100 mg Oral Daily   sodium chloride flush  3 mL Intravenous Q12H   sodium chloride HYPERTONIC  4 mL Nebulization Daily   zinc sulfate  220 mg Oral Daily    Continuous Infusions:  cefTRIAXone (ROCEPHIN)  IV 1 g (06/09/22 1400)     LOS: 3 days   Shelly Coss, MD [facility] P3/01/2023, 11:25 AM

## 2022-06-11 DIAGNOSIS — B338 Other specified viral diseases: Secondary | ICD-10-CM | POA: Diagnosis not present

## 2022-06-11 LAB — CBC
HCT: 33.1 % — ABNORMAL LOW (ref 36.0–46.0)
Hemoglobin: 9.4 g/dL — ABNORMAL LOW (ref 12.0–15.0)
MCH: 22 pg — ABNORMAL LOW (ref 26.0–34.0)
MCHC: 28.4 g/dL — ABNORMAL LOW (ref 30.0–36.0)
MCV: 77.5 fL — ABNORMAL LOW (ref 80.0–100.0)
Platelets: 265 10*3/uL (ref 150–400)
RBC: 4.27 MIL/uL (ref 3.87–5.11)
RDW: 18.4 % — ABNORMAL HIGH (ref 11.5–15.5)
WBC: 9.3 10*3/uL (ref 4.0–10.5)
nRBC: 0.2 % (ref 0.0–0.2)

## 2022-06-11 LAB — COMPREHENSIVE METABOLIC PANEL
ALT: 47 U/L — ABNORMAL HIGH (ref 0–44)
AST: 37 U/L (ref 15–41)
Albumin: 3.6 g/dL (ref 3.5–5.0)
Alkaline Phosphatase: 59 U/L (ref 38–126)
Anion gap: 9 (ref 5–15)
BUN: 12 mg/dL (ref 6–20)
CO2: 27 mmol/L (ref 22–32)
Calcium: 8.9 mg/dL (ref 8.9–10.3)
Chloride: 102 mmol/L (ref 98–111)
Creatinine, Ser: 0.42 mg/dL — ABNORMAL LOW (ref 0.44–1.00)
GFR, Estimated: >60 mL/min (ref >60)
Glucose, Bld: 146 mg/dL — ABNORMAL HIGH (ref 70–99)
Potassium: 4.1 mmol/L (ref 3.5–5.1)
Sodium: 138 mmol/L (ref 135–145)
Total Bilirubin: 0.5 mg/dL (ref 0.3–1.2)
Total Protein: 7.1 g/dL (ref 6.5–8.1)

## 2022-06-11 MED ORDER — FLUTICASONE PROPIONATE 50 MCG/ACT NA SUSP
1.0000 | Freq: Two times a day (BID) | NASAL | Status: DC
  Administered 2022-06-11 – 2022-06-13 (×5): 1 via NASAL
  Filled 2022-06-11: qty 16

## 2022-06-11 MED ORDER — PANTOPRAZOLE SODIUM 40 MG PO TBEC
40.0000 mg | DELAYED_RELEASE_TABLET | Freq: Two times a day (BID) | ORAL | Status: DC
  Administered 2022-06-11 – 2022-06-13 (×4): 40 mg via ORAL
  Filled 2022-06-11 (×4): qty 1

## 2022-06-11 NOTE — Progress Notes (Signed)
PROGRESS NOTE  [name]  [id number] DOB: Mar 12, 1981 DOA: 06/06/2022 PCP: Pcp, No   Brief Narrative:  Patient is a 42 year old female with history Transverse myelitis with paraplegia,bedbound, urostomy, complicated UTI, remote DVT, depression, PTSD, obesity who presented from skilled nursing facility with complaint of fever, cough, shortness of breath.  On presentation, she was tachypneic, requiring 2 to 3 L of oxygen per minute.  RSV PCR came to be positive.  Hospital course remarkable for persistent requirement of oxygen for maintenance of saturation, wheezing and cough.PCCM consulted today  Assessment & Plan:  Principal Problem:   RSV infection Active Problems:   Multiple sclerosis ([place])   Paraplegia (HCC)   Obesity (BMI 30-39.9)   Acute respiratory failure with hypoxia (HCC)  Acute respiratory failure with hypoxia secondary to RSV: Presented with cough, shortness of breath, fever.  Wheezing, tachypnea on presentation.  Started on  bronchodilators, steroids.We are attempting to wean the oxygen.  She is not on oxygen at baseline.This mrng she is on 2 L Procalcitonin nonreassuring.  Chest x-ray did not show any evidence of pneumonia.  No leukocytosis. Cough  have improved.  wheezing persist .continue bronchodilators.  Finished the course of abx.Elevated CRP. Steroids d/ced by PCCM , but patient is having persistent wheezing. Continue chest physiotherapy so that she can get rid of secretions.  Chest x-ray on 3/10  did not show any acute cardiopulmonary process.  History of transverse myelitis with paraplegia: Resides at skilled nursing facility.  Has chronic heel wounds.Bedbound.  Has urostomy.  Wound care consulted.  On baclofen, Valium, Neurontin  Depression/PTSD: On Zyprexa, Lamictal  Elevated liver enzymes: Mild, most likely associated with  viral illness.Improved  Microcytic anemia: Current hemoglobin stable in the range of 9.  Hypokalemia: Supplemented and  corrected  Abnormal UA: Urine was cloudy with leukocytes and positive nitrite.  No leukocytosis.  Fever is most likely from viral infection.  Urine looked very clean in the back. Deffered sending urine culture  Constipation: Continue lactulose       Pressure Injury Heel Left Deep Tissue Pressure Injury - Purple or maroon localized area of discolored intact skin or blood-filled blister due to damage of underlying soft tissue from pressure and/or shear. (Active)     Location: Heel  Location Orientation: Left  Staging: Deep Tissue Pressure Injury - Purple or maroon localized area of discolored intact skin or blood-filled blister due to damage of underlying soft tissue from pressure and/or shear.  Wound Description (Comments):   Present on Admission: Yes  Dressing Type Foam - Lift dressing to assess site every shift 06/10/22 2300     Pressure Injury 06/07/22 Sacrum (Active)  06/07/22 1612  Location: Sacrum  Location Orientation:   Staging:   Wound Description (Comments):   Present on Admission: Yes  Dressing Type Foam - Lift dressing to assess site every shift 06/10/22 2300    DVT prophylaxis:enoxaparin (LOVENOX) injection 40 mg Start: 06/07/22 1000 SCDs Start: 06/07/22 0250     Code Status: Full Code  Family Communication: Mother at bedside on 3/9  Patient status:Inpatient  Patient is from :SNF  Anticipated discharge to:SNF  Estimated DC date:tomorrow   Consultants: PCCM  Procedures:None  Antimicrobials:  Anti-infectives (From admission, onward)    Start     Dose/Rate Route Frequency Ordered Stop   06/07/22 1200  cefTRIAXone (ROCEPHIN) 1 g in sodium chloride 0.9 % 100 mL IVPB        1 g 200 mL/hr over 30 Minutes Intravenous Every 24 hours  06/07/22 1104 06/11/22 1120   06/07/22 1200  azithromycin (ZITHROMAX) 500 mg in sodium chloride 0.9 % 250 mL IVPB  Status:  Discontinued        500 mg 250 mL/hr over 60 Minutes Intravenous Every 24 hours 06/07/22 1104 06/09/22  0902       Subjective:  Patient seen and examined at bedside today.  Hemodynamically stable.  Still on 2 L of oxygen per minute.  Still wheezing.  She feels better though.  Cough is better today.    Objective: Vitals:   06/11/22 0210 06/11/22 0500 06/11/22 0602 06/11/22 0758  BP:   114/66   Pulse:   70   Resp:   16   Temp:   97.7 F (36.5 C)   TempSrc:   Oral   SpO2: 96%  98% 96%  Weight:  84.4 kg    Height:        Intake/Output Summary (Last 24 hours) at 06/11/2022 1245 Last data filed at 06/11/2022 1047 Gross per 24 hour  Intake 483 ml  Output 1000 ml  Net -517 ml   Filed Weights   06/06/22 2308 06/08/22 0500 06/11/22 0500  Weight: 85.3 kg 91 kg 84.4 kg    Examination:  General exam: Lying in bed, weak, deconditioned, chronically ill looking, obese HEENT: PERRL Respiratory system:  bilateral wheezing  Cardiovascular system: S1 & S2 heard, RRR.  Gastrointestinal system: Abdomen is nondistended, soft and nontender.Urostomy Central nervous system: Alert and oriented Extremities: Contractures of lower extremities Skin: Heel ulcers   Data Reviewed: I have personally reviewed following labs and imaging studies  CBC: Recent Labs  Lab 06/06/22 2315 06/07/22 0415 06/08/22 0620 06/11/22 0245  WBC 7.6 6.0 6.5 9.3  NEUTROABS 6.9 5.0  --   --   HGB 9.7* 9.3* 9.3* 9.4*  HCT 33.0* 32.2* 34.0* 33.1*  MCV 75.9* 76.8* 79.4* 77.5*  PLT 232 208 228 [id number]   Basic Metabolic Panel: Recent Labs  Lab 06/06/22 2315 06/07/22 0415 06/08/22 0620 06/09/22 1035 06/11/22 0245  NA 134* 138 139 140 138  K 3.7 3.9 3.7 3.4* 4.1  CL 101 102 103 103 102  CO2 '25 27 25 27 27  '$ GLUCOSE 148* 117* 114* 153* 146*  BUN '10 9 8 9 12  '$ CREATININE 0.48 0.43* 0.36* 0.37* 0.42*  CALCIUM 8.6* 8.4* 8.5* 8.8* 8.9  MG  --  2.6*  --   --   --   PHOS  --  3.2  --   --   --      Recent Results (from the past 240 hour(s))  Culture, blood (Routine x 2)     Status: None (Preliminary result)    Collection Time: 06/06/22 11:15 PM   Specimen: Right Antecubital; Blood  Result Value Ref Range Status   Specimen Description   Final    RIGHT ANTECUBITAL BLOOD Performed at [facility], [address], [place], [place] [address]    Special Requests   Final    BOTTLES DRAWN AEROBIC AND ANAEROBIC Blood Culture results may not be optimal due to an inadequate volume of blood received in culture bottles Performed at [facility] [address] [place], [place] [address]    Culture   Final    NO GROWTH 3 DAYS Performed at [facility], [place] [address] [place], [place] [address]    Report Status PENDING  Incomplete  Resp panel by RT-PCR (RSV, Flu A&B, Covid) Anterior Nasal Swab  Status: Abnormal   Collection Time: 06/06/22 11:15 PM   Specimen: Anterior Nasal Swab  Result Value Ref Range Status   SARS Coronavirus 2 by RT PCR NEGATIVE NEGATIVE Final    Comment: (NOTE) SARS-CoV-2 target nucleic acids are NOT DETECTED.  The SARS-CoV-2 RNA is generally detectable in upper respiratory specimens during the acute phase of infection. The lowest concentration of SARS-CoV-2 viral copies this assay can detect is 138 copies/mL. A negative result does not preclude SARS-Cov-2 infection and should not be used as the sole basis for treatment or other patient management decisions. A negative result may occur with  improper specimen collection/handling, submission of specimen other than nasopharyngeal swab, presence of viral mutation(s) within the areas targeted by this assay, and inadequate number of viral copies(<138 copies/mL). A negative result must be combined with clinical observations, patient history, and epidemiological information. The expected result is Negative.  Fact Sheet for Patients:  EntrepreneurPulse.com.au  Fact Sheet for Healthcare Providers:  IncredibleEmployment.be  This test is no t yet approved or  cleared by the [place] FDA and  has been authorized for detection and/or diagnosis of SARS-CoV-2 by FDA under an Emergency Use Authorization (EUA). This EUA will remain  in effect (meaning this test can be used) for the duration of the COVID-19 declaration under Section 564(b)(1) of the Act, 21 U.S.C.section 360bbb-3(b)(1), unless the authorization is terminated  or revoked sooner.       Influenza A by PCR NEGATIVE NEGATIVE Final   Influenza B by PCR NEGATIVE NEGATIVE Final    Comment: (NOTE) The Xpert Xpress SARS-CoV-2/FLU/RSV plus assay is intended as an aid in the diagnosis of influenza from Nasopharyngeal swab specimens and should not be used as a sole basis for treatment. Nasal washings and aspirates are unacceptable for Xpert Xpress SARS-CoV-2/FLU/RSV testing.  Fact Sheet for Patients: EntrepreneurPulse.com.au  Fact Sheet for Healthcare Providers: IncredibleEmployment.be  This test is not yet approved or cleared by the [place] FDA and has been authorized for detection and/or diagnosis of SARS-CoV-2 by FDA under an Emergency Use Authorization (EUA). This EUA will remain in effect (meaning this test can be used) for the duration of the COVID-19 declaration under Section 564(b)(1) of the Act, 21 U.S.C. section 360bbb-3(b)(1), unless the authorization is terminated or revoked.     Resp Syncytial Virus by PCR POSITIVE (A) NEGATIVE Final    Comment: (NOTE) Fact Sheet for Patients: EntrepreneurPulse.com.au  Fact Sheet for Healthcare Providers: IncredibleEmployment.be  This test is not yet approved or cleared by the [place] FDA and has been authorized for detection and/or diagnosis of SARS-CoV-2 by FDA under an Emergency Use Authorization (EUA). This EUA will remain in effect (meaning this test can be used) for the duration of the COVID-19 declaration under Section 564(b)(1) of the Act,  21 U.S.C. section 360bbb-3(b)(1), unless the authorization is terminated or revoked.  Performed at [facility], [place] [address] [place], [place] [address]   Culture, blood (Routine x 2)     Status: None (Preliminary result)   Collection Time: 06/06/22 11:50 PM   Specimen: BLOOD LEFT FOREARM  Result Value Ref Range Status   Specimen Description   Final    BLOOD LEFT FOREARM Performed at [facility] [address], [place], [place] [address]    Special Requests   Final    BOTTLES DRAWN AEROBIC AND ANAEROBIC Blood Culture adequate volume Performed at [facility] [address], [place], [place] [address]  Culture   Final    NO GROWTH 3 DAYS Performed at [facility], [place] [address] [place], [place] [address]    Report Status PENDING  Incomplete     Radiology Studies: DG CHEST PORT 1 VIEW  Result Date: 06/10/2022 CLINICAL DATA:  Wheezing.  Fever, cough, weakness and rales. EXAM: PORTABLE CHEST 1 VIEW COMPARISON:  06/06/2022. FINDINGS: Clear lungs. Stable cardiac and mediastinal contours. No pleural effusion or pneumothorax. Visualized bones and upper abdomen are unremarkable. IMPRESSION: No evidence of acute cardiopulmonary disease. Electronically Signed   By: Emmit Alexanders M.D.   On: 06/10/2022 10:37    Scheduled Meds:  acidophilus  1 capsule Oral Daily   ascorbic acid  500 mg Oral Daily   baclofen  20 mg Oral QID   diazepam  5 mg Oral QAC breakfast   diazepam  5 mg Oral QHS   enoxaparin (LOVENOX) injection  40 mg Subcutaneous Q24H   fluticasone  1 spray Each Nare BID   gabapentin  900 mg Oral TID   guaiFENesin  600 mg Oral BID   ipratropium-albuterol  3 mL Nebulization Q6H   lactulose  10 g Oral TID   lamoTRIgine  100 mg Oral BID   OLANZapine  2.5 mg Oral QHS   pantoprazole  40 mg Oral BID   sertraline  100 mg Oral Daily   sodium chloride flush  3 mL Intravenous Q12H   zinc sulfate  220 mg Oral  Daily   Continuous Infusions:     LOS: 4 days   Shelly Coss, MD [facility] P3/02/2023, 12:45 PM

## 2022-06-11 NOTE — Plan of Care (Signed)
?  Problem: Clinical Measurements: ?Goal: Will remain free from infection ?Outcome: Progressing ?  ?Problem: Nutrition: ?Goal: Adequate nutrition will be maintained ?Outcome: Progressing ?  ?Problem: Pain Managment: ?Goal: General experience of comfort will improve ?Outcome: Progressing ?  ?

## 2022-06-11 NOTE — Progress Notes (Signed)
SATURATION QUALIFICATIONS: (This note is used to comply with regulatory documentation for home oxygen)  Patient Saturations on Room Air at Rest = 87%  Patient Saturations on Room Air while Ambulating patient non ambulatory  Patient Saturations on 1 Liters of oxygen at rest = 93%  Please briefly explain why patient needs home oxygen:
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
## 2022-06-11 NOTE — Consult Note (Signed)
NAME:  [name], MRN:  [id number], DOB:  16-Aug-1980, LOS: 4 ADMISSION DATE:  06/06/2022, CONSULTATION DATE: 3/11 REFERRING MD:  Tawanna Solo , CHIEF COMPLAINT: Cough and dyspnea  History of Present Illness:  42 year old bedbound paraplegic female who presented on 3/6 from skilled nursing facility with fever cough and shortness of breath.  RSV PCR was positive.  Admitted with new supplemental oxygen need.  Therapeutic interventions have included: 5-day course of empiric antibiotics, supplemental oxygen and systemic steroids.  In spite of above interventions the patient continues to have ongoing cough, wheezing and dyspnea for which pulmonary was asked to evaluate on 3/11  Pertinent  Medical History  Transverse myelitis, bedbound and paraplegic.  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 Obesity   Significant Hospital Events: Including procedures, antibiotic start and stop dates in addition to other pertinent events   3/6 through 3/11 admitted with RSV infection.  Therapeutic interventions have included empiric antibiotics completed 5 days of ceftriaxone, supplemental oxygen, systemic steroids, scheduled bronchodilators. 3/11 pulmonary asked to evaluate as patient still has significant wheezing and cough  Interim History / Subjective:  Chronically ill-appearing   Objective   Blood pressure 114/66, pulse 70, temperature 97.7 F (36.5 C), temperature source Oral, resp. rate 16, height '5\' 3"'$  (1.6 m), weight 84.4 kg, last menstrual period 05/09/2022, SpO2 96 %.        Intake/Output Summary (Last 24 hours) at 06/11/2022 1135 Last data filed at 06/11/2022 1047 Gross per 24 hour  Intake 733 ml  Output 1000 ml  Net -267 ml   Filed Weights   06/06/22 2308 06/08/22 0500 06/11/22 0500  Weight: 85.3 kg 91 kg 84.4 kg    Examination: General: Female obese bedbound 42 year old female currently slow to respond but in no acute distress affect is flat HENT: Congestion  nasal sounding phonation with upper airway wheezing Lungs: Diffuse wheezing and rhonchi no accessory use currently 1 L/min Cardiovascular: Regular rate and rhythm Abdomen: Soft not tender Extremities: Warm dry Neuro: Oriented, paraplegic   Resolved Hospital Problem list    Assessment & Plan:  Transverse myelitis Paraplegia Bedbound Remote DVT Depression PTSD Urostomy Cough Acute respiratory failure (resolved) Bronchospasm RSV infection   Cough and dyspnea secondary to RSV infection -Still have some ongoing wheezing which is typically slow to clear with this infection -Her current saturations suggest she could further wean oxygen.  Her chest x-ray is without infiltrates  Plan Continue to wean oxygen, will check room air sats Stop steroids Would be careful with hypertonic saline will stop this as I suspect this is making her cough worse Add flonase Stop steroids  Cont cough suppression meds PPI BID   Best Practice (right click and "Reselect all SmartList Selections" daily)  Per primary  Labs   CBC: Recent Labs  Lab 06/06/22 2315 06/07/22 0415 06/08/22 0620 06/11/22 0245  WBC 7.6 6.0 6.5 9.3  NEUTROABS 6.9 5.0  --   --   HGB 9.7* 9.3* 9.3* 9.4*  HCT 33.0* 32.2* 34.0* 33.1*  MCV 75.9* 76.8* 79.4* 77.5*  PLT 232 208 228 [id number]    Basic Metabolic Panel: Recent Labs  Lab 06/06/22 2315 06/07/22 0415 06/08/22 0620 06/09/22 1035 06/11/22 0245  NA 134* 138 139 140 138  K 3.7 3.9 3.7 3.4* 4.1  CL 101 102 103 103 102  CO2 '25 27 25 27 27  '$ GLUCOSE 148* 117* 114* 153* 146*  BUN '10 9 8 9 12  '$ CREATININE 0.48 0.43* 0.36* 0.37* 0.42*  CALCIUM 8.6* 8.4* 8.5* 8.8* 8.9  MG  --  2.6*  --   --   --   PHOS  --  3.2  --   --   --    GFR: Estimated Creatinine Clearance: 95.3 mL/min (A) (by C-G formula based on SCr of 0.42 mg/dL (L)). Recent Labs  Lab 06/06/22 2315 06/07/22 0235 06/07/22 0415 06/07/22 1206 06/08/22 0620 06/11/22 0245  PROCALCITON  --   --   --   0.12  --   --   WBC 7.6  --  6.0  --  6.5 9.3  LATICACIDVEN 2.1* 0.8  --   --   --   --     Liver Function Tests: Recent Labs  Lab 06/06/22 2315 06/07/22 0415 06/08/22 0620 06/09/22 1035 06/11/22 0245  AST 84* 84* 76* 50* 37  ALT 61* 58* 68* 60* 47*  ALKPHOS 83 66 58 55 59  BILITOT 0.7 0.5 0.5 0.4 0.5  PROT 7.6 7.3 6.7 6.8 7.1  ALBUMIN 4.3 4.2 3.8 3.7 3.6   No results for input(s): "LIPASE", "AMYLASE" in the last 168 hours. No results for input(s): "AMMONIA" in the last 168 hours.  ABG    Component Value Date/Time   PHART 7.345 (L) 03/26/2017 2130   PCO2ART 41.6 03/26/2017 2130   PO2ART 103 03/26/2017 2130   HCO3 22.3 03/26/2017 2130   TCO2 22 10/29/2014 1735   ACIDBASEDEF 2.8 (H) 03/26/2017 2130   O2SAT 97.9 03/26/2017 2130     Coagulation Profile: Recent Labs  Lab 06/06/22 2315  INR 1.1    Cardiac Enzymes: No results for input(s): "CKTOTAL", "CKMB", "CKMBINDEX", "TROPONINI" in the last 168 hours.  HbA1C: No results found for: "HGBA1C"  CBG: No results for input(s): "GLUCAP" in the last 168 hours.  Review of Systems:   Review of Systems  Constitutional: Negative.   HENT: Negative.    Eyes: Negative.   Respiratory:  Positive for cough.   Cardiovascular: Negative.   Gastrointestinal: Negative.   Genitourinary: Negative.   Skin: Negative.      Past Medical History:  She,  has a past medical history of Bladder stones, Depression, GERD (gastroesophageal reflux disease), Intertrigo (04/24/2017), Left upper extremity swelling (04/24/2017), Multiple sclerosis ([place]), Paraplegia ([place]), Polycystic disease, ovaries, PTSD (post-traumatic stress disorder), Transverse myelitis ([place]), and Yeast infection (09/26/2017).   Surgical History:   Past Surgical History:  Procedure Laterality Date   BREAST BIOPSY Right 05/22/2022   [place] RT BREAST BX W LOC DEV 1ST LESION IMG BX SPEC US GUIDE 05/22/2022 [facility]   CHOLECYSTECTOMY     CYSTECTOMY W/ URETEROILEAL  CONDUIT     IR FLUORO GUIDE CV LINE LEFT  02/25/2017   IR NEPHROURETERAL CATH PLACE RIGHT  10/20/2021   IR US GUIDE VASC ACCESS LEFT  02/25/2017   IRRIGATION AND DEBRIDEMENT ABSCESS N/A 02/18/2017   Procedure: IRRIGATION AND DEBRIDEMENT ABSCESS;  Surgeon: Excell Seltzer, MD;  Location: [facility];  Service: General;  Laterality: N/A;   NEPHROLITHOTOMY Right 10/20/2021   Procedure: RIGHT NEPHROLITHOTOMY PERCUTANEOUS;  Surgeon: Irine Seal, MD;  Location: [facility];  Service: Urology;  Laterality: Right;   TONSILLECTOMY     WISDOM TOOTH EXTRACTION       Social History:   reports that she has never smoked. She has never used smokeless tobacco. She reports that she does not drink alcohol and does not use drugs.   Family History:  Her family history includes Breast cancer in an other family member; CAD  in her father; Dementia in her father; Diabetes type II in her father, mother, and another family member; Hypertension in her mother; Seizures in her father; Stroke in her father.   Allergies Allergies  Allergen Reactions   Iodine Anaphylaxis   Latex Anaphylaxis   Other Other (See Comments)    Documented on Order Summary Report - unknown reaction Apricot, collard greens, green bean, melon, peaches, turnip greens   Polymyxin B Hives    Pt had hives in right arm with infusion of polymyxin on November 26th, 2018   Shellfish-Derived Products Anaphylaxis and Other (See Comments)    All seafood   Vancomycin Anaphylaxis   Aspirin Other (See Comments)    "Allergic," per [place] Other (See Comments)    "Allergic," per [place] and Other (See Comments)    Patient requires 13 hour  prep prior to administration   Strawberry Extract Hives and Rash     Home Medications  Prior to Admission medications   Medication Sig Start Date End Date Taking? Authorizing Provider  acetaminophen (TYLENOL) 325 MG tablet Take 650 mg by mouth every 4  (four) hours as needed for headache, mild pain or fever. Take 650 mg by mouth two times a day and every 8 hours as needed for general discomfort   Yes [provider]  acetaminophen-codeine (TYLENOL #3) 300-30 MG tablet Take 1 tablet by mouth every 12 (twelve) hours as needed (for severe breakthrough pain). DO NOT EXCEED 4000 MG OF TYLENOL IN A DAY   Yes [provider]  ammonium lactate (LAC-HYDRIN) 12 % lotion Apply 1 Application topically See admin instructions. Apply to bilateral feet and legs every shift   Yes [provider]  ascorbic acid (VITAMIN C) 500 MG tablet Take 500 mg by mouth daily. 05/26/22 06/25/22 Yes [provider]  baclofen (LIORESAL) 20 MG tablet Take 1 tablet (20 mg total) by mouth 4 (four) times daily. 02/13/19  Yes Jaffe, Adam R, DO  diazepam (VALIUM) 5 MG tablet Take 1-2 tablets (5-10 mg total) by mouth 2 (two) times daily. Take 1 tablet (5 mg) BID and Take 2 tablets (10 mg) at bedtime Patient taking differently: Take 5-10 mg by mouth See admin instructions. Take 5 mg by mouth once a day and 10 mg in the evening 03/12/18  Yes Aline August, MD  Emollient (CERAVE PM) LOTN Apply 1 application  topically See admin instructions. Apply to the face every morning after cleansing   Yes [provider]  EPIPEN 2-PAK 0.3 MG/0.3ML SOAJ injection Inject 0.3 mg into the muscle as needed for anaphylaxis.   Yes [provider]  fluticasone (FLONASE) 50 MCG/ACT nasal spray Place 1 spray into the nose every 12 (twelve) hours as needed for allergies or rhinitis. Instill 1 spray into each nostril once a day and an additional once a day as needed for rhinitis   Yes [provider]  gabapentin (NEURONTIN) 300 MG capsule Take 3 caps ( 900 mg) Three times a day Patient taking differently: Take 900 mg by mouth 3 (three) times daily. 01/30/22  Yes Jaffe, Adam R, DO  GERI-TUSSIN 100 MG/5ML liquid Take 600 mg by mouth See admin instructions.  Take 600 mg by mouth four times a day from 06/06/2022 through 06/13/2022 and an additional 600 mg every 6 hours as needed for coughing   Yes [provider]  ipratropium-albuterol (DUONEB) 0.5-2.5 (3) MG/3ML SOLN Take 3 mLs by  nebulization every 4 (four) hours as needed (for wheezing).   Yes [provider]  LACTOBACILLUS PO Take 1 capsule by mouth daily.   Yes [provider]  lamoTRIgine (LAMICTAL) 100 MG tablet Take 100 mg by mouth 2 (two) times daily.   Yes [provider]  loratadine (CLARITIN) 10 MG tablet Take 10 mg by mouth daily.   Yes [provider]  Multiple Vitamins-Minerals (MULTIVITAMIN WITH MINERALS) tablet Take 1 tablet by mouth daily.   Yes [provider]  OLANZapine (ZYPREXA) 2.5 MG tablet Take 2.5 mg by mouth at bedtime.   Yes [provider]  omeprazole (PRILOSEC) 20 MG capsule Take 20 mg by mouth in the morning.   Yes [provider]  ondansetron (ZOFRAN) 4 MG tablet Take 4 mg by mouth every 8 (eight) hours as needed for nausea or vomiting.   Yes [provider]  SANTYL 250 UNIT/GM ointment Apply 1 Application topically See admin instructions. Apply to bilateral heels (wounds) every dsay shift on Mon/Wed/Fri. Apply skin prep around peri-wound and cover with Duoderm dressing.   Yes [provider]  sertraline (ZOLOFT) 100 MG tablet Take 1 tablet (100 mg total) by mouth 2 (two) times daily. 04/03/17  Yes Bonnielee Haff, MD  urea (CARMOL) 20 % cream Apply 1 application  topically See admin instructions. Apply to both feet once a day   Yes [provider]  zinc sulfate 220 (50 Zn) MG capsule Take 220 mg by mouth daily.   Yes [provider]  lamoTRIgine (LAMICTAL) 25 MG tablet Take 2 tablets (50 mg total) by mouth daily. Patient not taking: Reported on 06/07/2022 04/03/17   Bonnielee Haff, MD  ondansetron (ZOFRAN-ODT) 4 MG disintegrating tablet Take 1 tablet (4 mg total) by mouth  every 8 (eight) hours as needed for nausea or vomiting. Patient not taking: Reported on 06/07/2022 06/02/22   Tedd Sias, PA     Critical care time: NA

## 2022-06-12 DIAGNOSIS — B338 Other specified viral diseases: Secondary | ICD-10-CM | POA: Diagnosis not present

## 2022-06-12 LAB — CULTURE, BLOOD (ROUTINE X 2)
Culture: NO GROWTH
Culture: NO GROWTH
Special Requests: ADEQUATE

## 2022-06-12 LAB — LEGIONELLA PNEUMOPHILA SEROGP 1 UR AG: L. pneumophila Serogp 1 Ur Ag: NEGATIVE

## 2022-06-12 MED ORDER — LACTULOSE 10 GM/15ML PO SOLN
20.0000 g | Freq: Three times a day (TID) | ORAL | Status: DC
  Administered 2022-06-12: 20 g via ORAL
  Filled 2022-06-12 (×2): qty 30

## 2022-06-12 NOTE — Progress Notes (Signed)
   NAME:  [name], MRN:  [id number], DOB:  07-31-1980, LOS: 5 ADMISSION DATE:  06/06/2022, CONSULTATION DATE: 3/11 REFERRING MD:  Tawanna Solo , CHIEF COMPLAINT: Cough and dyspnea  History of Present Illness:  42 year old bedbound paraplegic female who presented on 3/6 from skilled nursing facility with fever cough and shortness of breath.  RSV PCR was positive.  Admitted with new supplemental oxygen need.  Therapeutic interventions have included: 5-day course of empiric antibiotics, supplemental oxygen and systemic steroids.  In spite of above interventions the patient continues to have ongoing cough, wheezing and dyspnea for which pulmonary was asked to evaluate on 3/11  Pertinent  Medical History  Transverse myelitis, bedbound and paraplegic.  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 Obesity   Significant Hospital Events: Including procedures, antibiotic start and stop dates in addition to other pertinent events   3/6 through 3/11 admitted with RSV infection.  Therapeutic interventions have included empiric antibiotics completed 5 days of ceftriaxone, supplemental oxygen, systemic steroids, scheduled bronchodilators. 3/11 pulmonary asked to evaluate as patient still has significant wheezing and cough  Looks about the same   Objective   Blood pressure 128/78, pulse 85, temperature 98.1 F (36.7 C), temperature source Oral, resp. rate 17, height 5\' 3"  (1.6 m), weight 84.4 kg, last menstrual period 05/09/2022, SpO2 95 %.        Intake/Output Summary (Last 24 hours) at 06/12/2022 1242 Last data filed at 06/12/2022 0900 Gross per 24 hour  Intake 696 ml  Output 2400 ml  Net -1704 ml    Filed Weights   06/06/22 2308 06/08/22 0500 06/11/22 0500  Weight: 85.3 kg 91 kg 84.4 kg    Examination: General 42 year old female laying in bed. No distress HENT NCAT still has sig upper airway rhonchi w. Nasal sounding phonation  Pulm coarse rhonchi worse w.  Cough Card rrr Abd soft  Ext warm  Neuro paraplegic. Intact    Resolved Hospital Problem list    Assessment & Plan:  Transverse myelitis Paraplegia Bedbound Remote DVT Depression PTSD Urostomy Cough Acute hypoxic respiratory failure Acute bronchitis  Bronchospasm RSV infection Post nasal gtt    Acute hypoxic resp failure w/ post nasal gtt, acute bronchitis, Cough and dyspnea secondary to RSV infection -Still have some ongoing wheezing which is typically slow to clear with this infection. Still has sig upper airway burden but she is on all of the correct therapies. I think at this point just needs time  Plan Discharge on Humidified Oxygen  Cont nasal steroids at least daily and cont nasal saline flush Cont PPI Cont BDs scheduled as she gets relief from this.  Fine for dc   Best Practice (right click and "Reselect all SmartList Selections" daily)  Per primary

## 2022-06-12 NOTE — Progress Notes (Addendum)
PROGRESS NOTE  Matie Pace Puma  [id number] DOB: 13-Mar-1981 DOA: 06/06/2022 PCP: Pcp, No   Brief Narrative:  Patient is a 42 year old female with history Transverse myelitis with paraplegia,bedbound, urostomy, complicated UTI, remote DVT, depression, PTSD, obesity who presented from skilled nursing facility with complaint of fever, cough, shortness of breath.  On presentation, she was tachypneic, requiring 2 to 3 L of oxygen per minute.  RSV PCR came to be positive.  Hospital course remarkable for persistent requirement of oxygen for maintenance of saturation, wheezing and cough.PCCM consulted.Overall status is improving but she does not feel ready to go to SnF today, also trying to wean oxygen.  Possible plan for discharge back  tomorrow to SNF.  TOC aware  Assessment & Plan:  Principal Problem:   RSV infection Active Problems:   Multiple sclerosis ([place])   Paraplegia (H[facility])   Obesity (BMI 30-39.9)   Acute respiratory failure with hypoxia (HCC)  Acute respiratory failure with hypoxia secondary to RSV: Presented with cough, shortness of breath, fever.  Wheezing, tachypnea on presentation.  Started on  bronchodilators, steroids.We are attempting to wean the oxygen.  She is not on oxygen at baseline.This mrng she is on 2 L Procalcitonin nonreassuring. .  No leukocytosis. Cough  have improved.  wheezing persist .continue bronchodilators.  Finished the course of abx.Elevated CRP. Steroids d/ced by PCCM , but patient is having persistent wheezing. We ordered  chest physiotherapy so that she can get rid of secretions.  Chest x-ray on 3/10  did not show any acute cardiopulmonary process. PCCM thinks that her wheezing could be positional and upper airway related due to position.  We recommend her to sit up on the chair, at least out of bed to chair once daily even at a SNF.  Will write an order for out of bed to chair daily as part of her treatment plan( please put on dc instruction).  PCCM  also recommended humidified oxygen on discharge  History of transverse myelitis with paraplegia: Resides at skilled nursing facility.  Has chronic heel wounds.Bedbound.  Has urostomy.  Wound care consulted.  On baclofen, Valium, Neurontin  Depression/PTSD: On Zyprexa, Lamictal  Elevated liver enzymes: Mild, most likely associated with  viral illness.Improved  Microcytic anemia: Current hemoglobin stable in the range of 9.  Hypokalemia: Supplemented and corrected  Abnormal UA: Urine was cloudy with leukocytes and positive nitrite.  No leukocytosis.  Fever was most likely from viral infection.  Urine looked very clean in the back. Deffered sending urine culture  Constipation: Continue lactulose       Pressure Injury Heel Left Deep Tissue Pressure Injury - Purple or maroon localized area of discolored intact skin or blood-filled blister due to damage of underlying soft tissue from pressure and/or shear. (Active)     Location: Heel  Location Orientation: Left  Staging: Deep Tissue Pressure Injury - Purple or maroon localized area of discolored intact skin or blood-filled blister due to damage of underlying soft tissue from pressure and/or shear.  Wound Description (Comments):   Present on Admission: Yes  Dressing Type Foam - Lift dressing to assess site every shift 06/12/22 0812     Pressure Injury 06/07/22 Sacrum (Active)  06/07/22 1612  Location: Sacrum  Location Orientation:   Staging:   Wound Description (Comments):   Present on Admission: Yes  Dressing Type Foam - Lift dressing to assess site every shift 06/12/22 0812    DVT prophylaxis:enoxaparin (LOVENOX) injection 40 mg Start: 06/07/22 1000 SCDs Start: 06/07/22 0250  Code Status: Full Code  Family Communication: Mother at bedside on 3/11  Patient status:Inpatient  Patient is from :SNF  Anticipated discharge to:SNF  Estimated DC date:tomorrow   Consultants: PCCM  Procedures:None  Antimicrobials:   Anti-infectives (From admission, onward)    Start     Dose/Rate Route Frequency Ordered Stop   06/07/22 1200  cefTRIAXone (ROCEPHIN) 1 g in sodium chloride 0.9 % 100 mL IVPB        1 g 200 mL/hr over 30 Minutes Intravenous Every 24 hours 06/07/22 1104 06/11/22 1120   06/07/22 1200  azithromycin (ZITHROMAX) 500 mg in sodium chloride 0.9 % 250 mL IVPB  Status:  Discontinued        500 mg 250 mL/hr over 60 Minutes Intravenous Every 24 hours 06/07/22 1104 06/09/22 0902       Subjective:  Patient seen and examined the bedside today.  On 1 L of oxygen.  Her cough is better today with less secretions.  Found to be still wheezing on auscultation.  Does not report worsening shortness of breath. She thinks she is  more appropriate for discharge tomorrow.  She wanted to get out of bed to chair today  Objective: Vitals:   06/12/22 0219 06/12/22 0427 06/12/22 0749 06/12/22 0812  BP:  112/72  128/78  Pulse:  81  85  Resp:  18  17  Temp:  98 F (36.7 C)  98.1 F (36.7 C)  TempSrc:    Oral  SpO2: 94% 93% 94% 95%  Weight:      Height:        Intake/Output Summary (Last 24 hours) at 06/12/2022 1246 Last data filed at 06/12/2022 0900 Gross per 24 hour  Intake 696 ml  Output 2400 ml  Net -1704 ml   Filed Weights   06/06/22 2308 06/08/22 0500 06/11/22 0500  Weight: 85.3 kg 91 kg 84.4 kg    Examination:   General exam: Lying in bed, obese, very deconditioned, chronically ill looking HEENT: PERRL Respiratory system: Bilateral wheezing Cardiovascular system: S1 & S2 heard, RRR.  Gastrointestinal system: Abdomen is nondistended, soft and nontender.  Urostomy Central nervous system: Alert and oriented Extremities: Contractures of lower extremities Skin: Bilateral heel ulcers   Data Reviewed: I have personally reviewed following labs and imaging studies  CBC: Recent Labs  Lab 06/06/22 2315 06/07/22 0415 06/08/22 0620 06/11/22 0245  WBC 7.6 6.0 6.5 9.3  NEUTROABS 6.9 5.0  --    --   HGB 9.7* 9.3* 9.3* 9.4*  HCT 33.0* 32.2* 34.0* 33.1*  MCV 75.9* 76.8* 79.4* 77.5*  PLT 232 208 228 [id number]   Basic Metabolic Panel: Recent Labs  Lab 06/06/22 2315 06/07/22 0415 06/08/22 0620 06/09/22 1035 06/11/22 0245  NA 134* 138 139 140 138  K 3.7 3.9 3.7 3.4* 4.1  CL 101 102 103 103 102  CO2 '25 27 25 27 27  '$ GLUCOSE 148* 117* 114* 153* 146*  BUN '10 9 8 9 12  '$ CREATININE 0.48 0.43* 0.36* 0.37* 0.42*  CALCIUM 8.6* 8.4* 8.5* 8.8* 8.9  MG  --  2.6*  --   --   --   PHOS  --  3.2  --   --   --      Recent Results (from the past 240 hour(s))  Culture, blood (Routine x 2)     Status: None   Collection Time: 06/06/22 11:15 PM   Specimen: Right Antecubital; Blood  Result Value Ref Range Status   Specimen Description  Final    RIGHT ANTECUBITAL BLOOD Performed at [facility], [place] [address] [place], [place] [address]    Special Requests   Final    BOTTLES DRAWN AEROBIC AND ANAEROBIC Blood Culture results may not be optimal due to an inadequate volume of blood received in culture bottles Performed at [facility] [address], [place], [place] [address]    Culture   Final    NO GROWTH 5 DAYS Performed at [facility], [place] [address], [place],  [address]    Report Status 06/12/2022 FINAL  Final  Resp panel by RT-PCR (RSV, Flu A&B, Covid) Anterior Nasal Swab     Status: Abnormal   Collection Time: 06/06/22 11:15 PM   Specimen: Anterior Nasal Swab  Result Value Ref Range Status   SARS Coronavirus 2 by RT PCR NEGATIVE NEGATIVE Final    Comment: (NOTE) SARS-CoV-2 target nucleic acids are NOT DETECTED.  The SARS-CoV-2 RNA is generally detectable in upper respiratory specimens during the acute phase of infection. The lowest concentration of SARS-CoV-2 viral copies this assay can detect is 138 copies/mL. A negative result does not preclude SARS-Cov-2 infection and should not be used as the sole basis for treatment or other patient  management decisions. A negative result may occur with  improper specimen collection/handling, submission of specimen other than nasopharyngeal swab, presence of viral mutation(s) within the areas targeted by this assay, and inadequate number of viral copies(<138 copies/mL). A negative result must be combined with clinical observations, patient history, and epidemiological information. The expected result is Negative.  Fact Sheet for Patients:  EntrepreneurPulse.com.au  Fact Sheet for Healthcare Providers:  IncredibleEmployment.be  This test is no t yet approved or cleared by the [place] FDA and  has been authorized for detection and/or diagnosis of SARS-CoV-2 by FDA under an Emergency Use Authorization (EUA). This EUA will remain  in effect (meaning this test can be used) for the duration of the COVID-19 declaration under Section 564(b)(1) of the Act, 21 U.S.C.section 360bbb-3(b)(1), unless the authorization is terminated  or revoked sooner.       Influenza A by PCR NEGATIVE NEGATIVE Final   Influenza B by PCR NEGATIVE NEGATIVE Final    Comment: (NOTE) The Xpert Xpress SARS-CoV-2/FLU/RSV plus assay is intended as an aid in the diagnosis of influenza from Nasopharyngeal swab specimens and should not be used as a sole basis for treatment. Nasal washings and aspirates are unacceptable for Xpert Xpress SARS-CoV-2/FLU/RSV testing.  Fact Sheet for Patients: EntrepreneurPulse.com.au  Fact Sheet for Healthcare Providers: IncredibleEmployment.be  This test is not yet approved or cleared by the [place] FDA and has been authorized for detection and/or diagnosis of SARS-CoV-2 by FDA under an Emergency Use Authorization (EUA). This EUA will remain in effect (meaning this test can be used) for the duration of the COVID-19 declaration under Section 564(b)(1) of the Act, 21 U.S.C. section 360bbb-3(b)(1),  unless the authorization is terminated or revoked.     Resp Syncytial Virus by PCR POSITIVE (A) NEGATIVE Final    Comment: (NOTE) Fact Sheet for Patients: EntrepreneurPulse.com.au  Fact Sheet for Healthcare Providers: IncredibleEmployment.be  This test is not yet approved or cleared by the [place] FDA and has been authorized for detection and/or diagnosis of SARS-CoV-2 by FDA under an Emergency Use Authorization (EUA). This EUA will remain in effect (meaning this test can be used) for the duration of the COVID-19 declaration under Section 564(b)(1) of the Act, 21 U.S.C.  section 360bbb-3(b)(1), unless the authorization is terminated or revoked.  Performed at [facility], [place] [address], [place], [place] [address]   Culture, blood (Routine x 2)     Status: None   Collection Time: 06/06/22 11:50 PM   Specimen: BLOOD LEFT FOREARM  Result Value Ref Range Status   Specimen Description   Final    BLOOD LEFT FOREARM Performed at [facility] [address], [place], [place] [address]    Special Requests   Final    BOTTLES DRAWN AEROBIC AND ANAEROBIC Blood Culture adequate volume Performed at [facility] [address], [place], [place] [address]    Culture   Final    NO GROWTH 5 DAYS Performed at [facility], [place] [address], [place], [place] [address]    Report Status 06/12/2022 FINAL  Final     Radiology Studies: No results found.  Scheduled Meds:  acidophilus  1 capsule Oral Daily   ascorbic acid  500 mg Oral Daily   baclofen  20 mg Oral QID   diazepam  5 mg Oral QAC breakfast   diazepam  5 mg Oral QHS   enoxaparin (LOVENOX) injection  40 mg Subcutaneous Q24H   fluticasone  1 spray Each Nare BID   gabapentin  900 mg Oral TID   guaiFENesin  600 mg Oral BID   ipratropium-albuterol  3 mL Nebulization Q6H   lactulose  10 g Oral TID   lamoTRIgine  100 mg Oral BID    OLANZapine  2.5 mg Oral QHS   pantoprazole  40 mg Oral BID   sertraline  100 mg Oral Daily   sodium chloride flush  3 mL Intravenous Q12H   zinc sulfate  220 mg Oral Daily   Continuous Infusions:     LOS: 5 days   Shelly Coss, MD [facility] P3/03/2023, 12:46 PM

## 2022-06-12 NOTE — Plan of Care (Signed)

## 2022-06-13 MED ORDER — LACTULOSE 10 GM/15ML PO SOLN: 10.0000 g | Freq: Three times a day (TID) | ORAL | 0 refills | Status: DC

## 2022-06-13 MED ORDER — ALBUTEROL SULFATE HFA 108 (90 BASE) MCG/ACT IN AERS: 2.0000 | INHALATION_SPRAY | Freq: Four times a day (QID) | RESPIRATORY_TRACT | 2 refills | Status: DC | PRN

## 2022-06-13 NOTE — Discharge Summary (Signed)
Physician Discharge Summary  Jaycie Geannie Polkowski [id number] DOB: 26-Jun-1980 DOA: 06/06/2022  PCP: Pcp, No  Admit date: 06/06/2022 Discharge date: 06/13/2022  Admitted From: SNF Disposition:  SNF  Discharge Condition:Stable CODE STATUS:FULL Diet recommendation: Regular  Brief/Interim Summary: Patient is a 42 year old female with history Transverse myelitis with paraplegia,bedbound, urostomy, complicated UTI, remote DVT, depression, PTSD, obesity who presented from skilled nursing facility with complaint of fever, cough, shortness of breath.  On presentation, she was tachypneic, requiring 2 to 3 L of oxygen per minute.  RSV PCR came to be positive.  Hospital course remarkable for persistent requirement of oxygen for maintenance of saturation, wheezing and cough.PCCM also consulted.Overall status is improving , qualified for oxygen at 1 L/min.  PCCM cleared for discharge to SNF.  Medically stable for discharge to SNF   Following problems were addressed during the hospitalization:  Acute respiratory failure with hypoxia secondary to RSV: Presented with cough, shortness of breath, fever.  Wheezing, tachypnea on presentation.  Started on  bronchodilators, steroids. She is not on oxygen at baseline.required 1 to 2 L of oxygen per minute.   Procalcitonin nonreassuring. No leukocytosis. Cough  have improved.  wheezing persisted . Finished the course of abx.Elevated CRP. Steroids d/ced by PCCM .We ordered  chest physiotherapy so that she can get rid of secretions.  Chest x-ray on 3/10  did not show any acute cardiopulmonary process. PCCM thinks that her wheezing could be positional and upper airway related due to position.  We recommend her to sit up on the chair, at least out of bed to chair once daily at  [facility].  PCCM also recommended humidified oxygen on discharge.  Qualified for 1 L of oxygen per minute   History of transverse myelitis with paraplegia: Resides at skilled nursing facility.  Has  chronic heel wounds.Bedbound.  Has urostomy.  Wound care consulted.  On baclofen, Valium, Neurontin   Depression/PTSD: On Zyprexa, Lamictal   Elevated liver enzymes: Mild, most likely associated with  viral illness.Improved   Microcytic anemia: Current hemoglobin stable in the range of 9.   Hypokalemia: Supplemented and corrected   Abnormal UA: Urine was cloudy with leukocytes and positive nitrite.  No leukocytosis.  Fever was most likely from viral infection.  Urine looked very clean in the back. Deffered sending urine culture   Constipation: Continue lactulose  Discharge Diagnoses:  Principal Problem:   RSV infection Active Problems:   Multiple sclerosis (HCC)   Paraplegia (HCC)   Obesity (BMI 30-39.9)   Acute respiratory failure with hypoxia [facility])    Discharge Instructions  Discharge Instructions     Diet general   Complete by: As directed    Discharge instructions   Complete by: As directed    1)Please take your medications as instructed 2)Try to be out of bed to chair at least once  a day 3)Use humidified oxygen if needed   Discharge wound care:   Complete by: As directed    As per wound care nurse   Increase activity slowly   Complete by: As directed       Allergies as of 06/13/2022       Reactions   Iodine Anaphylaxis   Latex Anaphylaxis   Other Other (See Comments)   Documented on Order Summary Report - unknown reaction Apricot, collard greens, green bean, melon, peaches, turnip greens   Polymyxin B Hives   Pt had hives in right arm with infusion of polymyxin on November 26th, 2018   Shellfish-derived Products Anaphylaxis,  Other (See Comments)   All seafood   Vancomycin Anaphylaxis   Aspirin Other (See Comments)   "Allergic," per [employer] Other (See Comments)   "Allergic," per [place], Other (See Comments)   Patient requires 13 hour  prep prior to administration   Strawberry Extract  Hives, Rash        Medication List     STOP taking these medications    ondansetron 4 MG disintegrating tablet Commonly known as: ZOFRAN-ODT       TAKE these medications    acetaminophen 325 MG tablet Commonly known as: TYLENOL Take 650 mg by mouth every 4 (four) hours as needed for headache, mild pain or fever. Take 650 mg by mouth two times a day and every 8 hours as needed for general discomfort   acetaminophen-codeine 300-30 MG tablet Commonly known as: TYLENOL #3 Take 1 tablet by mouth every 12 (twelve) hours as needed (for severe breakthrough pain). DO NOT EXCEED 4000 MG OF TYLENOL IN A DAY   albuterol 108 (90 Base) MCG/ACT inhaler Commonly known as: VENTOLIN HFA Inhale 2 puffs into the lungs every 6 (six) hours as needed for wheezing or shortness of breath.   ammonium lactate 12 % lotion Commonly known as: LAC-HYDRIN Apply 1 Application topically See admin instructions. Apply to bilateral feet and legs every shift   ascorbic acid 500 MG tablet Commonly known as: VITAMIN C Take 500 mg by mouth daily.   baclofen 20 MG tablet Commonly known as: LIORESAL Take 1 tablet (20 mg total) by mouth 4 (four) times daily.   CeraVe PM Lotn Apply 1 application  topically See admin instructions. Apply to the face every morning after cleansing   diazepam 5 MG tablet Commonly known as: VALIUM Take 1-2 tablets (5-10 mg total) by mouth 2 (two) times daily. Take 1 tablet (5 mg) BID and Take 2 tablets (10 mg) at bedtime What changed:  when to take this additional instructions   EpiPen 2-Pak 0.3 mg/0.3 mL Soaj injection Generic drug: EPINEPHrine Inject 0.3 mg into the muscle as needed for anaphylaxis.   fluticasone 50 MCG/ACT nasal spray Commonly known as: FLONASE Place 1 spray into the nose every 12 (twelve) hours as needed for allergies or rhinitis. Instill 1 spray into each nostril once a day and an additional once a day as needed for rhinitis   gabapentin 300 MG  capsule Commonly known as: NEURONTIN Take 3 caps ( 900 mg) Three times a day What changed:  how much to take how to take this when to take this additional instructions   Geri-Tussin 100 MG/5ML liquid Generic drug: guaiFENesin Take 600 mg by mouth See admin instructions. Take 600 mg by mouth four times a day from 06/06/2022 through 06/13/2022 and an additional 600 mg every 6 hours as needed for coughing   ipratropium-albuterol 0.5-2.5 (3) MG/3ML Soln Commonly known as: DUONEB Take 3 mLs by nebulization every 4 (four) hours as needed (for wheezing).   LACTOBACILLUS PO Take 1 capsule by mouth daily.   lactulose 10 GM/15ML solution Commonly known as: CHRONULAC Take 15 mLs (10 g total) by mouth 3 (three) times daily.   LaMICtal 100 MG tablet Generic drug: lamoTRIgine Take 100 mg by mouth 2 (two) times daily. What changed: Another medication with the same name was removed. Continue taking this medication, and follow the directions you see here.   loratadine 10 MG tablet Commonly known as: CLARITIN Take  10 mg by mouth daily.   multivitamin with minerals tablet Take 1 tablet by mouth daily.   OLANZapine 2.5 MG tablet Commonly known as: ZYPREXA Take 2.5 mg by mouth at bedtime.   omeprazole 20 MG capsule Commonly known as: PRILOSEC Take 20 mg by mouth in the morning.   ondansetron 4 MG tablet Commonly known as: ZOFRAN Take 4 mg by mouth every 8 (eight) hours as needed for nausea or vomiting.   Santyl 250 UNIT/GM ointment Generic drug: collagenase Apply 1 Application topically See admin instructions. Apply to bilateral heels (wounds) every dsay shift on Mon/Wed/Fri. Apply skin prep around peri-wound and cover with Duoderm dressing.   sertraline 100 MG tablet Commonly known as: ZOLOFT Take 1 tablet (100 mg total) by mouth 2 (two) times daily.   urea 20 % cream Commonly known as: CARMOL Apply 1 application  topically See admin instructions. Apply to both feet once a day    zinc sulfate 220 (50 Zn) MG capsule Take 220 mg by mouth daily.               Discharge Care Instructions  (From admission, onward)           Start     Ordered   06/13/22 0000  Discharge wound care:       Comments: As per wound care nurse   06/13/22 1111            Allergies  Allergen Reactions   Iodine Anaphylaxis   Latex Anaphylaxis   Other Other (See Comments)    Documented on Order Summary Report - unknown reaction Apricot, collard greens, green bean, melon, peaches, turnip greens   Polymyxin B Hives    Pt had hives in right arm with infusion of polymyxin on November 26th, 2018   Shellfish-Derived Products Anaphylaxis and Other (See Comments)    All seafood   Vancomycin Anaphylaxis   Aspirin Other (See Comments)    "Allergic," per [place] Other (See Comments)    "Allergic," per [place] and Other (See Comments)    Patient requires 13 hour  prep prior to administration   Strawberry Extract Hives and Rash    Consultations: PCCM   Procedures/Studies: DG CHEST PORT 1 VIEW  Result Date: 06/10/2022 CLINICAL DATA:  Wheezing.  Fever, cough, weakness and rales. EXAM: PORTABLE CHEST 1 VIEW COMPARISON:  06/06/2022. FINDINGS: Clear lungs. Stable cardiac and mediastinal contours. No pleural effusion or pneumothorax. Visualized bones and upper abdomen are unremarkable. IMPRESSION: No evidence of acute cardiopulmonary disease. Electronically Signed   By: Emmit Alexanders M.D.   On: 06/10/2022 10:37   DG Chest Port 1 View  Result Date: 06/06/2022 CLINICAL DATA:  Shortness of breath, cough EXAM: PORTABLE CHEST 1 VIEW COMPARISON:  10/23/2021 FINDINGS: Lungs are clear.  No pleural effusion or pneumothorax. The heart is normal in size. IMPRESSION: No evidence of acute cardiopulmonary disease. Electronically Signed   By: Julian Hy M.D.   On: 06/06/2022 23:37   CT Renal Stone Study  Result Date:  06/02/2022 CLINICAL DATA:  Abdominal/flank pain and back pain. Kidney stones suspected. EXAM: CT ABDOMEN AND PELVIS WITHOUT CONTRAST TECHNIQUE: Multidetector CT imaging of the abdomen and pelvis was performed following the standard protocol without IV contrast. RADIATION DOSE REDUCTION: This exam was performed according to the departmental dose-optimization program which includes automated exposure control, adjustment of the mA and/or kV according to patient size and/or use of iterative  reconstruction technique. COMPARISON:  10/21/2021 FINDINGS: Lower chest: Subsegmental atelectasis noted in the dependent lung bases. Hepatobiliary: Hepatomegaly. No suspicious focal abnormality in the liver on this study without intravenous contrast. The liver shows diffusely decreased attenuation suggesting fat deposition. Gallbladder is surgically absent. No intrahepatic or extrahepatic biliary dilation. Pancreas: No focal mass lesion. No dilatation of the main duct. No intraparenchymal cyst. No peripancreatic edema. Spleen: No splenomegaly. No focal mass lesion. Adrenals/Urinary Tract: No adrenal nodule or mass. Punctate nonobstructing stones noted in the right kidney. No evidence for left nephrolithiasis. No secondary changes in either kidney or ureter. 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 Stomach/Bowel: Stomach is unremarkable. No gastric wall thickening. No evidence of outlet obstruction. Duodenum is normally positioned as is the ligament of Treitz. No small bowel wall thickening. No small bowel dilatation. The terminal ileum is normal. The appendix is not well visualized, but there is no edema or inflammation in the region of the cecum. No gross colonic mass. No colonic wall thickening. Vascular/Lymphatic: No abdominal aortic aneurysm. No abdominal aortic atherosclerotic calcification. There is no gastrohepatic or hepatoduodenal ligament lymphadenopathy. No retroperitoneal or mesenteric  lymphadenopathy. No pelvic sidewall lymphadenopathy. Reproductive: Unremarkable. Other: No intraperitoneal free fluid. Musculoskeletal: No worrisome lytic or sclerotic osseous abnormality. IMPRESSION: 1. No acute findings in the abdomen or pelvis. Specifically, no findings to explain the patient's history of abdominal pain. 2. Punctate nonobstructing right renal stones. No nephrolithiasis on the current study. For left 3. 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 4. Hepatic steatosis. Electronically Signed   By: Misty Stanley M.D.   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 This was found to be concordant by Dr. Ammie Ferrier. Pathology results were discussed with the patient by telephone. The patient reported doing well after the biopsy with tenderness at the site. Post biopsy instructions and care were reviewed and questions were answered. The patient was encouraged to call [facility] for any additional concerns. Recommendation: papilloma protocol per [facility] Working Group recommended. Given that patient is non-ambulatory, she will only get ultrasound follow-ups- (first in 6 months, followed by 6 months, and then in 12 months for total of 2 yrs imaging.) The patient was instructed to return for RIGHT diagnostic ultrasound in 6 months and informed a reminder notice would be sent regarding this appointment. Pathology results reported by Stacie Acres RN on 05/24/2022. Electronically Signed   By: Ammie Ferrier M.D.   On: 05/24/2022 10:33   Result Date: 05/24/2022 CLINICAL DATA:  42 year old female presenting for ultrasound-guided biopsy of a right breast mass at 9 o'clock. EXAM: ULTRASOUND GUIDED RIGHT BREAST CORE NEEDLE BIOPSY  COMPARISON:  Previous exam(s). PROCEDURE: I met with the patient and we discussed the procedure of ultrasound-guided biopsy, including benefits and alternatives. We discussed the high likelihood of a successful procedure. We discussed the risks of the procedure, including infection, bleeding, tissue injury, clip migration, and inadequate sampling. Informed written consent was given. The usual time-out protocol was performed immediately prior to the procedure. Lesion quadrant: Upper inner quadrant Using sterile technique and 1% Lidocaine as local anesthetic, under direct ultrasound visualization, a 14 gauge spring-loaded device was used to perform biopsy of a right breast mass at 9 o'clock, 3 cm  from the nipple using an inferolateral approach. At the conclusion of the procedure a HydroMARK spiral shaped tissue marker clip was deployed into the biopsy cavity. IMPRESSION: Ultrasound guided biopsy of a right breast mass at 9 o'clock. No apparent complications. Electronically Signed: By: Ammie Ferrier M.D. On: 05/22/2022 11:06     Subjective: Patient seen and examined at bedside today.  Hemodynamically stable.  On 1 L of oxygen.  Denies any worsening shortness of breath or cough.  Still have some mild wheezing bilaterally.  Feels ready to go back to nursing facility today.Called her mother to discuss about discharge planning, call not received  Discharge Exam: Vitals:   06/13/22 0605 06/13/22 0750  BP: 104/66   Pulse: 88   Resp:    Temp: 97.9 F (36.6 C)   SpO2: 96% 97%   Vitals:   06/13/22 0223 06/13/22 0500 06/13/22 0605 06/13/22 0750  BP:   104/66   Pulse:   88   Resp:      Temp:   97.9 F (36.6 C)   TempSrc:   Oral   SpO2: 95%  96% 97%  Weight:  84.4 kg    Height:        General: Pt is alert, awake, not in acute distress,obese, deconditioned Cardiovascular: RRR, S1/S2 +, no rubs, no gallops Respiratory: Mild bilateral  wheezing Abdominal: Soft, NT, ND, bowel sounds + Extremities:  Contractures of lower extremity, ulcers on the heels    The results of significant diagnostics from this hospitalization (including imaging, microbiology, ancillary and laboratory) are listed below for reference.     Microbiology: Recent Results (from the past 240 hour(s))  Culture, blood (Routine x 2)     Status: None   Collection Time: 06/06/22 11:15 PM   Specimen: Right Antecubital; Blood  Result Value Ref Range Status   Specimen Description   Final    RIGHT ANTECUBITAL BLOOD Performed at [facility], [address], [place], [place] [address]    Special Requests   Final    BOTTLES DRAWN AEROBIC AND ANAEROBIC Blood Culture results may not be optimal due to an inadequate volume of blood received in culture bottles Performed at [facility] [address] [place], [place] [address]    Culture   Final    NO GROWTH 5 DAYS Performed at [facility], [place] [address] [place], [place] [address]    Report Status 06/12/2022 FINAL  Final  Resp panel by RT-PCR (RSV, Flu A&B, Covid) Anterior Nasal Swab     Status: Abnormal   Collection Time: 06/06/22 11:15 PM   Specimen: Anterior Nasal Swab  Result Value Ref Range Status   SARS Coronavirus 2 by RT PCR NEGATIVE NEGATIVE Final    Comment: (NOTE) SARS-CoV-2 target nucleic acids are NOT DETECTED.  The SARS-CoV-2 RNA is generally detectable in upper respiratory specimens during the acute phase of infection. The lowest concentration of SARS-CoV-2 viral copies this assay can detect is 138 copies/mL. A negative result does not preclude SARS-Cov-2 infection and should not be used as the sole basis for treatment or other patient management decisions. A negative result may occur with  improper specimen collection/handling, submission of specimen other than nasopharyngeal swab, presence of viral mutation(s) within the areas targeted by this assay, and inadequate number of viral copies(<138 copies/mL). A negative  result must be combined with clinical observations, patient history, and epidemiological information. The expected result is Negative.  Fact Sheet for Patients:  EntrepreneurPulse.com.au  Fact Sheet for Healthcare Providers:  IncredibleEmployment.be  This test is no t yet approved or cleared by the [place] FDA and  has been authorized for detection and/or diagnosis of SARS-CoV-2 by FDA under an Emergency Use Authorization (EUA). This EUA will remain  in effect (meaning this test can be used) for the duration of the COVID-19 declaration under Section 564(b)(1) of the Act, 21 U.S.C.section 360bbb-3(b)(1), unless the authorization is terminated  or revoked sooner.       Influenza A by PCR NEGATIVE NEGATIVE Final   Influenza B by PCR NEGATIVE NEGATIVE Final    Comment: (NOTE) The Xpert Xpress SARS-CoV-2/FLU/RSV plus assay is intended as an aid in the diagnosis of influenza from Nasopharyngeal swab specimens and should not be used as a sole basis for treatment. Nasal washings and aspirates are unacceptable for Xpert Xpress SARS-CoV-2/FLU/RSV testing.  Fact Sheet for Patients: EntrepreneurPulse.com.au  Fact Sheet for Healthcare Providers: IncredibleEmployment.be  This test is not yet approved or cleared by the [place] FDA and has been authorized for detection and/or diagnosis of SARS-CoV-2 by FDA under an Emergency Use Authorization (EUA). This EUA will remain in effect (meaning this test can be used) for the duration of the COVID-19 declaration under Section 564(b)(1) of the Act, 21 U.S.C. section 360bbb-3(b)(1), unless the authorization is terminated or revoked.     Resp Syncytial Virus by PCR POSITIVE (A) NEGATIVE Final    Comment: (NOTE) Fact Sheet for Patients: EntrepreneurPulse.com.au  Fact Sheet for Healthcare  Providers: IncredibleEmployment.be  This test is not yet approved or cleared by the [place] FDA and has been authorized for detection and/or diagnosis of SARS-CoV-2 by FDA under an Emergency Use Authorization (EUA). This EUA will remain in effect (meaning this test can be used) for the duration of the COVID-19 declaration under Section 564(b)(1) of the Act, 21 U.S.C. section 360bbb-3(b)(1), unless the authorization is terminated or revoked.  Performed at [facility], [place] [address], [place], [place] [address]   Culture, blood (Routine x 2)     Status: None   Collection Time: 06/06/22 11:50 PM   Specimen: BLOOD LEFT FOREARM  Result Value Ref Range Status   Specimen Description   Final    BLOOD LEFT FOREARM Performed at [facility] [address], [place], [place] [address]    Special Requests   Final    BOTTLES DRAWN AEROBIC AND ANAEROBIC Blood Culture adequate volume Performed at [facility] [address] [place], [place] [address]    Culture   Final    NO GROWTH 5 DAYS Performed at [facility], [place] [address], [place], [place] [address]    Report Status 06/12/2022 FINAL  Final     Labs: BNP (last 3 results) No results for input(s): "BNP" in the last 8760 hours. Basic Metabolic Panel: Recent Labs  Lab 06/06/22 2315 06/07/22 0415 06/08/22 0620 06/09/22 1035 06/11/22 0245  NA 134* 138 139 140 138  K 3.7 3.9 3.7 3.4* 4.1  CL 101 102 103 103 102  CO2 '25 27 25 27 27  '$ GLUCOSE 148* 117* 114* 153* 146*  BUN '10 9 8 9 12  '$ CREATININE 0.48 0.43* 0.36* 0.37* 0.42*  CALCIUM 8.6* 8.4* 8.5* 8.8* 8.9  MG  --  2.6*  --   --   --   PHOS  --  3.2  --   --   --    Liver Function Tests: Recent Labs  Lab  06/06/22 2315 06/07/22 0415 06/08/22 0620 06/09/22 1035 06/11/22 0245  AST 84* 84* 76* 50* 37  ALT 61* 58* 68* 60* 47*  ALKPHOS 83 66 58 55 59  BILITOT 0.7 0.5 0.5 0.4 0.5  PROT  7.6 7.3 6.7 6.8 7.1  ALBUMIN 4.3 4.2 3.8 3.7 3.6   No results for input(s): "LIPASE", "AMYLASE" in the last 168 hours. No results for input(s): "AMMONIA" in the last 168 hours. CBC: Recent Labs  Lab 06/06/22 2315 06/07/22 0415 06/08/22 0620 06/11/22 0245  WBC 7.6 6.0 6.5 9.3  NEUTROABS 6.9 5.0  --   --   HGB 9.7* 9.3* 9.3* 9.4*  HCT 33.0* 32.2* 34.0* 33.1*  MCV 75.9* 76.8* 79.4* 77.5*  PLT 232 208 228 265   Cardiac Enzymes: No results for input(s): "CKTOTAL", "CKMB", "CKMBINDEX", "TROPONINI" in the last 168 hours. BNP: Invalid input(s): "POCBNP" CBG: No results for input(s): "GLUCAP" in the last 168 hours. D-Dimer No results for input(s): "DDIMER" in the last 72 hours. Hgb A1c No results for input(s): "HGBA1C" in the last 72 hours. Lipid Profile No results for input(s): "CHOL", "HDL", "LDLCALC", "TRIG", "CHOLHDL", "LDLDIRECT" in the last 72 hours. Thyroid function studies No results for input(s): "TSH", "T4TOTAL", "T3FREE", "THYROIDAB" in the last 72 hours.  Invalid input(s): "FREET3" Anemia work up No results for input(s): "VITAMINB12", "FOLATE", "FERRITIN", "TIBC", "IRON", "RETICCTPCT" in the last 72 hours. Urinalysis    Component Value Date/Time   COLORURINE YELLOW 06/07/2022 0125   APPEARANCEUR CLOUDY (A) 06/07/2022 0125   LABSPEC 1.019 06/07/2022 0125   PHURINE 5.0 06/07/2022 0125   GLUCOSEU NEGATIVE 06/07/2022 0125   HGBUR NEGATIVE 06/07/2022 0125   BILIRUBINUR NEGATIVE 06/07/2022 0125   KETONESUR NEGATIVE 06/07/2022 0125   PROTEINUR 30 (A) 06/07/2022 0125   UROBILINOGEN 2.0 (H) 10/29/2014 1627   NITRITE POSITIVE (A) 06/07/2022 0125   LEUKOCYTESUR LARGE (A) 06/07/2022 0125   Sepsis Labs Recent Labs  Lab 06/06/22 2315 06/07/22 0415 06/08/22 0620 06/11/22 0245  WBC 7.6 6.0 6.5 9.3   Microbiology Recent Results (from the past 240 hour(s))  Culture, blood (Routine x 2)     Status: None   Collection Time: 06/06/22 11:15 PM   Specimen: Right  Antecubital; Blood  Result Value Ref Range Status   Specimen Description   Final    RIGHT ANTECUBITAL BLOOD Performed at [facility], [address] [place], [place] [address]    Special Requests   Final    BOTTLES DRAWN AEROBIC AND ANAEROBIC Blood Culture results may not be optimal due to an inadequate volume of blood received in culture bottles Performed at [facility] [address], [place], [place] [address]    Culture   Final    NO GROWTH 5 DAYS Performed at [facility], [place] [address], [place], [place] [address]    Report Status 06/12/2022 FINAL  Final  Resp panel by RT-PCR (RSV, Flu A&B, Covid) Anterior Nasal Swab     Status: Abnormal   Collection Time: 06/06/22 11:15 PM   Specimen: Anterior Nasal Swab  Result Value Ref Range Status   SARS Coronavirus 2 by RT PCR NEGATIVE NEGATIVE Final    Comment: (NOTE) SARS-CoV-2 target nucleic acids are NOT DETECTED.  The SARS-CoV-2 RNA is generally detectable in upper respiratory specimens during the acute phase of infection. The lowest concentration of SARS-CoV-2 viral copies this assay can detect is 138 copies/mL. A negative result does not preclude SARS-Cov-2 infection and should not be used  as the sole basis for treatment or other patient management decisions. A negative result may occur with  improper specimen collection/handling, submission of specimen other than nasopharyngeal swab, presence of viral mutation(s) within the areas targeted by this assay, and inadequate number of viral copies(<138 copies/mL). A negative result must be combined with clinical observations, patient history, and epidemiological information. The expected result is Negative.  Fact Sheet for Patients:  EntrepreneurPulse.com.au  Fact Sheet for Healthcare Providers:  IncredibleEmployment.be  This test is no t yet approved or cleared by the [place] FDA and  has been  authorized for detection and/or diagnosis of SARS-CoV-2 by FDA under an Emergency Use Authorization (EUA). This EUA will remain  in effect (meaning this test can be used) for the duration of the COVID-19 declaration under Section 564(b)(1) of the Act, 21 U.S.C.section 360bbb-3(b)(1), unless the authorization is terminated  or revoked sooner.       Influenza A by PCR NEGATIVE NEGATIVE Final   Influenza B by PCR NEGATIVE NEGATIVE Final    Comment: (NOTE) The Xpert Xpress SARS-CoV-2/FLU/RSV plus assay is intended as an aid in the diagnosis of influenza from Nasopharyngeal swab specimens and should not be used as a sole basis for treatment. Nasal washings and aspirates are unacceptable for Xpert Xpress SARS-CoV-2/FLU/RSV testing.  Fact Sheet for Patients: EntrepreneurPulse.com.au  Fact Sheet for Healthcare Providers: IncredibleEmployment.be  This test is not yet approved or cleared by the [place] FDA and has been authorized for detection and/or diagnosis of SARS-CoV-2 by FDA under an Emergency Use Authorization (EUA). This EUA will remain in effect (meaning this test can be used) for the duration of the COVID-19 declaration under Section 564(b)(1) of the Act, 21 U.S.C. section 360bbb-3(b)(1), unless the authorization is terminated or revoked.     Resp Syncytial Virus by PCR POSITIVE (A) NEGATIVE Final    Comment: (NOTE) Fact Sheet for Patients: EntrepreneurPulse.com.au  Fact Sheet for Healthcare Providers: IncredibleEmployment.be  This test is not yet approved or cleared by the [place] FDA and has been authorized for detection and/or diagnosis of SARS-CoV-2 by FDA under an Emergency Use Authorization (EUA). This EUA will remain in effect (meaning this test can be used) for the duration of the COVID-19 declaration under Section 564(b)(1) of the Act, 21 U.S.C. section 360bbb-3(b)(1), unless the  authorization is terminated or revoked.  Performed at [facility], [place] [address] [place], [place] [address]   Culture, blood (Routine x 2)     Status: None   Collection Time: 06/06/22 11:50 PM   Specimen: BLOOD LEFT FOREARM  Result Value Ref Range Status   Specimen Description   Final    BLOOD LEFT FOREARM Performed at [facility] [address] [place], [place] [address]    Special Requests   Final    BOTTLES DRAWN AEROBIC AND ANAEROBIC Blood Culture adequate volume Performed at [facility] [address] [place], [place] [address]    Culture   Final    NO GROWTH 5 DAYS Performed at [facility], [place] [address] [place], [place] [address]    Report Status 06/12/2022 FINAL  Final    Please note: You were cared for by a hospitalist during your hospital stay. Once you are discharged, your primary care physician will handle any further medical issues. Please note that NO REFILLS for any discharge medications will be authorized once you are discharged, as it is imperative that you return to your primary care  physician (or establish a relationship with a primary care physician if you do not have one) for your post hospital discharge needs so that they can reassess your need for medications and monitor your lab values.    Time coordinating discharge: 40 minutes  SIGNED:   Shelly Coss, MD  [facility] 06/13/2022, 11:21 AM Pager [phone]  If 7PM-7AM, please contact night-coverage [email or web address] Physician Discharge Summary  Chemeka Charese Wergin [id number] DOB: 1980-10-09 DOA: 06/06/2022  PCP: Pcp, No  Admit date: 06/06/2022 Discharge date: 06/13/2022  Admitted From: Home Disposition:  Home  Discharge Condition:Stable CODE STATUS:FULL, DNR, Comfort Care Diet recommendation: Heart Healthy / Carb Modified / Regular / Dysphagia   Brief/Interim Summary:   Following problems  were addressed during the hospitalization:   Discharge Diagnoses:  Principal Problem:   RSV infection Active Problems:   Multiple sclerosis ([place])   Paraplegia ([place])   Obesity (BMI 30-39.9)   Acute respiratory failure with hypoxia [facility])    Discharge Instructions  Discharge Instructions     Diet general   Complete by: As directed    Discharge instructions   Complete by: As directed    1)Please take your medications as instructed 2)Try to be out of bed to chair at least once  a day 3)Use humidified oxygen if needed   Discharge wound care:   Complete by: As directed    As per wound care nurse   Increase activity slowly   Complete by: As directed       Allergies as of 06/13/2022       Reactions   Iodine Anaphylaxis   Latex Anaphylaxis   Other Other (See Comments)   Documented on Order Summary Report - unknown reaction Apricot, collard greens, green bean, melon, peaches, turnip greens   Polymyxin B Hives   Pt had hives in right arm with infusion of polymyxin on November 26th, 2018   Shellfish-derived Products Anaphylaxis, Other (See Comments)   All seafood   Vancomycin Anaphylaxis   Aspirin Other (See Comments)   "Allergic," per [facility] Other (See Comments)   "Allergic," per [place]   Iodinated Contrast Media Hives, Other (See Comments)   Patient requires 13 hour  prep prior to administration   Strawberry Extract Hives, Rash        Medication List     STOP taking these medications    ondansetron 4 MG disintegrating tablet Commonly known as: ZOFRAN-ODT       TAKE these medications    acetaminophen 325 MG tablet Commonly known as: TYLENOL Take 650 mg by mouth every 4 (four) hours as needed for headache, mild pain or fever. Take 650 mg by mouth two times a day and every 8 hours as needed for general discomfort   acetaminophen-codeine 300-30 MG tablet Commonly known as: TYLENOL #3 Take 1 tablet by mouth every 12 (twelve)  hours as needed (for severe breakthrough pain). DO NOT EXCEED 4000 MG OF TYLENOL IN A DAY   albuterol 108 (90 Base) MCG/ACT inhaler Commonly known as: VENTOLIN HFA Inhale 2 puffs into the lungs every 6 (six) hours as needed for wheezing or shortness of breath.   ammonium lactate 12 % lotion Commonly known as: LAC-HYDRIN Apply 1 Application topically See admin instructions. Apply to bilateral feet and legs every shift   ascorbic acid 500 MG tablet Commonly known as: VITAMIN C Take 500 mg by mouth daily.   baclofen 20 MG tablet Commonly known as: LIORESAL Take  1 tablet (20 mg total) by mouth 4 (four) times daily.   CeraVe PM Lotn Apply 1 application  topically See admin instructions. Apply to the face every morning after cleansing   diazepam 5 MG tablet Commonly known as: VALIUM Take 1-2 tablets (5-10 mg total) by mouth 2 (two) times daily. Take 1 tablet (5 mg) BID and Take 2 tablets (10 mg) at bedtime What changed:  when to take this additional instructions   EpiPen 2-Pak 0.3 mg/0.3 mL Soaj injection Generic drug: EPINEPHrine Inject 0.3 mg into the muscle as needed for anaphylaxis.   fluticasone 50 MCG/ACT nasal spray Commonly known as: FLONASE Place 1 spray into the nose every 12 (twelve) hours as needed for allergies or rhinitis. Instill 1 spray into each nostril once a day and an additional once a day as needed for rhinitis   gabapentin 300 MG capsule Commonly known as: NEURONTIN Take 3 caps ( 900 mg) Three times a day What changed:  how much to take how to take this when to take this additional instructions   Geri-Tussin 100 MG/5ML liquid Generic drug: guaiFENesin Take 600 mg by mouth See admin instructions. Take 600 mg by mouth four times a day from 06/06/2022 through 06/13/2022 and an additional 600 mg every 6 hours as needed for coughing   ipratropium-albuterol 0.5-2.5 (3) MG/3ML Soln Commonly known as: DUONEB Take 3 mLs by nebulization every 4 (four) hours as  needed (for wheezing).   LACTOBACILLUS PO Take 1 capsule by mouth daily.   lactulose 10 GM/15ML solution Commonly known as: CHRONULAC Take 15 mLs (10 g total) by mouth 3 (three) times daily.   LaMICtal 100 MG tablet Generic drug: lamoTRIgine Take 100 mg by mouth 2 (two) times daily. What changed: Another medication with the same name was removed. Continue taking this medication, and follow the directions you see here.   loratadine 10 MG tablet Commonly known as: CLARITIN Take 10 mg by mouth daily.   multivitamin with minerals tablet Take 1 tablet by mouth daily.   OLANZapine 2.5 MG tablet Commonly known as: ZYPREXA Take 2.5 mg by mouth at bedtime.   omeprazole 20 MG capsule Commonly known as: PRILOSEC Take 20 mg by mouth in the morning.   ondansetron 4 MG tablet Commonly known as: ZOFRAN Take 4 mg by mouth every 8 (eight) hours as needed for nausea or vomiting.   Santyl 250 UNIT/GM ointment Generic drug: collagenase Apply 1 Application topically See admin instructions. Apply to bilateral heels (wounds) every dsay shift on Mon/Wed/Fri. Apply skin prep around peri-wound and cover with Duoderm dressing.   sertraline 100 MG tablet Commonly known as: ZOLOFT Take 1 tablet (100 mg total) by mouth 2 (two) times daily.   urea 20 % cream Commonly known as: CARMOL Apply 1 application  topically See admin instructions. Apply to both feet once a day   zinc sulfate 220 (50 Zn) MG capsule Take 220 mg by mouth daily.               Discharge Care Instructions  (From admission, onward)           Start     Ordered   06/13/22 0000  Discharge wound care:       Comments: As per wound care nurse   06/13/22 1111            Allergies  Allergen Reactions   Iodine Anaphylaxis   Latex Anaphylaxis   Other Other (See Comments)    Documented  on Order Summary Report - unknown reaction Apricot, collard greens, green bean, melon, peaches, turnip greens   Polymyxin B  Hives    Pt had hives in right arm with infusion of polymyxin on November 26th, 2018   Shellfish-Derived Products Anaphylaxis and Other (See Comments)    All seafood   Vancomycin Anaphylaxis   Aspirin Other (See Comments)    "Allergic," per [facility] Other (See Comments)    "Allergic," per [place] and Other (See Comments)    Patient requires 13 hour  prep prior to administration   Strawberry Extract Hives and Rash    Consultations:    Procedures/Studies: DG CHEST PORT 1 VIEW  Result Date: 06/10/2022 CLINICAL DATA:  Wheezing.  Fever, cough, weakness and rales. EXAM: PORTABLE CHEST 1 VIEW COMPARISON:  06/06/2022. FINDINGS: Clear lungs. Stable cardiac and mediastinal contours. No pleural effusion or pneumothorax. Visualized bones and upper abdomen are unremarkable. IMPRESSION: No evidence of acute cardiopulmonary disease. Electronically Signed   By: Emmit Alexanders M.D.   On: 06/10/2022 10:37   DG Chest Port 1 View  Result Date: 06/06/2022 CLINICAL DATA:  Shortness of breath, cough EXAM: PORTABLE CHEST 1 VIEW COMPARISON:  10/23/2021 FINDINGS: Lungs are clear.  No pleural effusion or pneumothorax. The heart is normal in size. IMPRESSION: No evidence of acute cardiopulmonary disease. Electronically Signed   By: Julian Hy M.D.   On: 06/06/2022 23:37   CT Renal Stone Study  Result Date: 06/02/2022 CLINICAL DATA:  Abdominal/flank pain and back pain. Kidney stones suspected. EXAM: CT ABDOMEN AND PELVIS WITHOUT CONTRAST TECHNIQUE: Multidetector CT imaging of the abdomen and pelvis was performed following the standard protocol without IV contrast. RADIATION DOSE REDUCTION: This exam was performed according to the departmental dose-optimization program which includes automated exposure control, adjustment of the mA and/or kV according to patient size and/or use of iterative reconstruction technique. COMPARISON:  10/21/2021  FINDINGS: Lower chest: Subsegmental atelectasis noted in the dependent lung bases. Hepatobiliary: Hepatomegaly. No suspicious focal abnormality in the liver on this study without intravenous contrast. The liver shows diffusely decreased attenuation suggesting fat deposition. Gallbladder is surgically absent. No intrahepatic or extrahepatic biliary dilation. Pancreas: No focal mass lesion. No dilatation of the main duct. No intraparenchymal cyst. No peripancreatic edema. Spleen: No splenomegaly. No focal mass lesion. Adrenals/Urinary Tract: No adrenal nodule or mass. Punctate nonobstructing stones noted in the right kidney. No evidence for left nephrolithiasis. No secondary changes in either kidney or ureter. 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 Stomach/Bowel: Stomach is unremarkable. No gastric wall thickening. No evidence of outlet obstruction. Duodenum is normally positioned as is the ligament of Treitz. No small bowel wall thickening. No small bowel dilatation. The terminal ileum is normal. The appendix is not well visualized, but there is no edema or inflammation in the region of the cecum. No gross colonic mass. No colonic wall thickening. Vascular/Lymphatic: No abdominal aortic aneurysm. No abdominal aortic atherosclerotic calcification. There is no gastrohepatic or hepatoduodenal ligament lymphadenopathy. No retroperitoneal or mesenteric lymphadenopathy. No pelvic sidewall lymphadenopathy. Reproductive: Unremarkable. Other: No intraperitoneal free fluid. Musculoskeletal: No worrisome lytic or sclerotic osseous abnormality. IMPRESSION: 1. No acute findings in the abdomen or pelvis. Specifically, no findings to explain the patient's history of abdominal pain. 2. Punctate nonobstructing right renal stones. No nephrolithiasis on the current study. For left 3. 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 4. Hepatic steatosis. Electronically Signed  By: Misty Stanley M.D.   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 This was found to be concordant by Dr. Ammie Ferrier. Pathology results were discussed with the patient by telephone. The patient reported doing well after the biopsy with tenderness at the site. Post biopsy instructions and care were reviewed and questions were answered. The patient was encouraged to call [facility] for any additional concerns. Recommendation: papilloma protocol per [facility] Working Group recommended. Given that patient is non-ambulatory, she will only get ultrasound follow-ups- (first in 6 months, followed by 6 months, and then in 12 months for total of 2 yrs imaging.) The patient was instructed to return for RIGHT diagnostic ultrasound in 6 months and informed a reminder notice would be sent regarding this appointment. Pathology results reported by Stacie Acres RN on 05/24/2022. Electronically Signed   By: Ammie Ferrier M.D.   On: 05/24/2022 10:33   Result Date: 05/24/2022 CLINICAL DATA:  42 year old female presenting for ultrasound-guided biopsy of a right breast mass at 9 o'clock. EXAM: ULTRASOUND GUIDED RIGHT BREAST CORE NEEDLE BIOPSY COMPARISON:  Previous exam(s). PROCEDURE: I met with the patient and we discussed the procedure of ultrasound-guided biopsy, including benefits and alternatives. We discussed the high likelihood of a successful procedure. We discussed the risks of the procedure, including infection, bleeding, tissue injury, clip migration, and inadequate sampling. Informed written consent was given. The usual time-out protocol was performed immediately prior to the procedure. Lesion quadrant: Upper inner quadrant Using sterile technique and 1% Lidocaine as local  anesthetic, under direct ultrasound visualization, a 14 gauge spring-loaded device was used to perform biopsy of a right breast mass at 9 o'clock, 3 cm from the nipple using an inferolateral approach. At the conclusion of the procedure a HydroMARK spiral shaped tissue marker clip was deployed into the biopsy cavity. IMPRESSION: Ultrasound guided biopsy of a right breast mass at 9 o'clock. No apparent complications. Electronically Signed: By: Ammie Ferrier M.D. On: 05/22/2022 11:06     Subjective:   Discharge Exam: Vitals:   06/13/22 0605 06/13/22 0750  BP: 104/66   Pulse: 88   Resp:    Temp: 97.9 F (36.6 C)   SpO2: 96% 97%   Vitals:   06/13/22 0223 06/13/22 0500 06/13/22 0605 06/13/22 0750  BP:   104/66   Pulse:   88   Resp:      Temp:   97.9 F (36.6 C)   TempSrc:   Oral   SpO2: 95%  96% 97%  Weight:  84.4 kg    Height:        General: Pt is alert, awake, not in acute distress Cardiovascular: RRR, S1/S2 +, no rubs, no gallops Respiratory: CTA bilaterally, no wheezing, no rhonchi Abdominal: Soft, NT, ND, bowel sounds + Extremities: no edema, no cyanosis    The results of significant diagnostics from this hospitalization (including imaging, microbiology, ancillary and laboratory) are listed below for reference.     Microbiology: Recent Results (from the past 240 hour(s))  Culture, blood (Routine x 2)     Status: None   Collection Time: 06/06/22 11:15 PM   Specimen: Right Antecubital; Blood  Result Value Ref Range Status   Specimen Description   Final  RIGHT ANTECUBITAL BLOOD Performed at [facility], [place] [address] [place], [place] [address]    Special Requests   Final    BOTTLES DRAWN AEROBIC AND ANAEROBIC Blood Culture results may not be optimal due to an inadequate volume of blood received in culture bottles Performed at [facility] [address], [place], [place] [address]    Culture   Final    NO GROWTH 5 DAYS Performed  at [facility], [place] [address] [place], [place] [address]    Report Status 06/12/2022 FINAL  Final  Resp panel by RT-PCR (RSV, Flu A&B, Covid) Anterior Nasal Swab     Status: Abnormal   Collection Time: 06/06/22 11:15 PM   Specimen: Anterior Nasal Swab  Result Value Ref Range Status   SARS Coronavirus 2 by RT PCR NEGATIVE NEGATIVE Final    Comment: (NOTE) SARS-CoV-2 target nucleic acids are NOT DETECTED.  The SARS-CoV-2 RNA is generally detectable in upper respiratory specimens during the acute phase of infection. The lowest concentration of SARS-CoV-2 viral copies this assay can detect is 138 copies/mL. A negative result does not preclude SARS-Cov-2 infection and should not be used as the sole basis for treatment or other patient management decisions. A negative result may occur with  improper specimen collection/handling, submission of specimen other than nasopharyngeal swab, presence of viral mutation(s) within the areas targeted by this assay, and inadequate number of viral copies(<138 copies/mL). A negative result must be combined with clinical observations, patient history, and epidemiological information. The expected result is Negative.  Fact Sheet for Patients:  EntrepreneurPulse.com.au  Fact Sheet for Healthcare Providers:  IncredibleEmployment.be  This test is no t yet approved or cleared by the [place] FDA and  has been authorized for detection and/or diagnosis of SARS-CoV-2 by FDA under an Emergency Use Authorization (EUA). This EUA will remain  in effect (meaning this test can be used) for the duration of the COVID-19 declaration under Section 564(b)(1) of the Act, 21 U.S.C.section 360bbb-3(b)(1), unless the authorization is terminated  or revoked sooner.       Influenza A by PCR NEGATIVE NEGATIVE Final   Influenza B by PCR NEGATIVE NEGATIVE Final    Comment: (NOTE) The Xpert Xpress SARS-CoV-2/FLU/RSV plus assay  is intended as an aid in the diagnosis of influenza from Nasopharyngeal swab specimens and should not be used as a sole basis for treatment. Nasal washings and aspirates are unacceptable for Xpert Xpress SARS-CoV-2/FLU/RSV testing.  Fact Sheet for Patients: EntrepreneurPulse.com.au  Fact Sheet for Healthcare Providers: IncredibleEmployment.be  This test is not yet approved or cleared by the [place] FDA and has been authorized for detection and/or diagnosis of SARS-CoV-2 by FDA under an Emergency Use Authorization (EUA). This EUA will remain in effect (meaning this test can be used) for the duration of the COVID-19 declaration under Section 564(b)(1) of the Act, 21 U.S.C. section 360bbb-3(b)(1), unless the authorization is terminated or revoked.     Resp Syncytial Virus by PCR POSITIVE (A) NEGATIVE Final    Comment: (NOTE) Fact Sheet for Patients: EntrepreneurPulse.com.au  Fact Sheet for Healthcare Providers: IncredibleEmployment.be  This test is not yet approved or cleared by the [place] FDA and has been authorized for detection and/or diagnosis of SARS-CoV-2 by FDA under an Emergency Use Authorization (EUA). This EUA will remain in effect (meaning this test can be used) for the duration of the COVID-19 declaration under Section 564(b)(1) of the Act, 21 U.S.C. section 360bbb-3(b)(1), unless the  authorization is terminated or revoked.  Performed at [facility], [place] [address] [place], [place] [address]   Culture, blood (Routine x 2)     Status: None   Collection Time: 06/06/22 11:50 PM   Specimen: BLOOD LEFT FOREARM  Result Value Ref Range Status   Specimen Description   Final    BLOOD LEFT FOREARM Performed at [facility] [address] [place], [place] [address]    Special Requests   Final    BOTTLES DRAWN AEROBIC AND ANAEROBIC Blood Culture  adequate volume Performed at [facility] [address], [place], [place] [address]    Culture   Final    NO GROWTH 5 DAYS Performed at [facility], [place] [address], [place], [place] [address]    Report Status 06/12/2022 FINAL  Final     Labs: BNP (last 3 results) No results for input(s): "BNP" in the last 8760 hours. Basic Metabolic Panel: Recent Labs  Lab 06/06/22 2315 06/07/22 0415 06/08/22 0620 06/09/22 1035 06/11/22 0245  NA 134* 138 139 140 138  K 3.7 3.9 3.7 3.4* 4.1  CL 101 102 103 103 102  CO2 '25 27 25 27 27  '$ GLUCOSE 148* 117* 114* 153* 146*  BUN '10 9 8 9 12  '$ CREATININE 0.48 0.43* 0.36* 0.37* 0.42*  CALCIUM 8.6* 8.4* 8.5* 8.8* 8.9  MG  --  2.6*  --   --   --   PHOS  --  3.2  --   --   --    Liver Function Tests: Recent Labs  Lab 06/06/22 2315 06/07/22 0415 06/08/22 0620 06/09/22 1035 06/11/22 0245  AST 84* 84* 76* 50* 37  ALT 61* 58* 68* 60* 47*  ALKPHOS 83 66 58 55 59  BILITOT 0.7 0.5 0.5 0.4 0.5  PROT 7.6 7.3 6.7 6.8 7.1  ALBUMIN 4.3 4.2 3.8 3.7 3.6   No results for input(s): "LIPASE", "AMYLASE" in the last 168 hours. No results for input(s): "AMMONIA" in the last 168 hours. CBC: Recent Labs  Lab 06/06/22 2315 06/07/22 0415 06/08/22 0620 06/11/22 0245  WBC 7.6 6.0 6.5 9.3  NEUTROABS 6.9 5.0  --   --   HGB 9.7* 9.3* 9.3* 9.4*  HCT 33.0* 32.2* 34.0* 33.1*  MCV 75.9* 76.8* 79.4* 77.5*  PLT 232 208 228 265   Cardiac Enzymes: No results for input(s): "CKTOTAL", "CKMB", "CKMBINDEX", "TROPONINI" in the last 168 hours. BNP: Invalid input(s): "POCBNP" CBG: No results for input(s): "GLUCAP" in the last 168 hours. D-Dimer No results for input(s): "DDIMER" in the last 72 hours. Hgb A1c No results for input(s): "HGBA1C" in the last 72 hours. Lipid Profile No results for input(s): "CHOL", "HDL", "LDLCALC", "TRIG", "CHOLHDL", "LDLDIRECT" in the last 72 hours. Thyroid function studies No results for input(s): "TSH",  "T4TOTAL", "T3FREE", "THYROIDAB" in the last 72 hours.  Invalid input(s): "FREET3" Anemia work up No results for input(s): "VITAMINB12", "FOLATE", "FERRITIN", "TIBC", "IRON", "RETICCTPCT" in the last 72 hours. Urinalysis    Component Value Date/Time   COLORURINE YELLOW 06/07/2022 0125   APPEARANCEUR CLOUDY (A) 06/07/2022 0125   LABSPEC 1.019 06/07/2022 0125   PHURINE 5.0 06/07/2022 0125   GLUCOSEU NEGATIVE 06/07/2022 0125   HGBUR NEGATIVE 06/07/2022 0125   BILIRUBINUR NEGATIVE 06/07/2022 0125   KETONESUR NEGATIVE 06/07/2022 0125   PROTEINUR 30 (A) 06/07/2022 0125   UROBILINOGEN 2.0 (H) 10/29/2014 1627   NITRITE POSITIVE (A) 06/07/2022 0125   LEUKOCYTESUR LARGE (A) 06/07/2022  0125   Sepsis Labs Recent Labs  Lab 06/06/22 2315 06/07/22 0415 06/08/22 0620 06/11/22 0245  WBC 7.6 6.0 6.5 9.3   Microbiology Recent Results (from the past 240 hour(s))  Culture, blood (Routine x 2)     Status: None   Collection Time: 06/06/22 11:15 PM   Specimen: Right Antecubital; Blood  Result Value Ref Range Status   Specimen Description   Final    RIGHT ANTECUBITAL BLOOD Performed at [facility] [address], [place], [place] [address]    Special Requests   Final    BOTTLES DRAWN AEROBIC AND ANAEROBIC Blood Culture results may not be optimal due to an inadequate volume of blood received in culture bottles Performed at [facility] [address], [place], [place] [address]    Culture   Final    NO GROWTH 5 DAYS Performed at [facility], [place] [address] [place], [place] [address]    Report Status 06/12/2022 FINAL  Final  Resp panel by RT-PCR (RSV, Flu A&B, Covid) Anterior Nasal Swab     Status: Abnormal   Collection Time: 06/06/22 11:15 PM   Specimen: Anterior Nasal Swab  Result Value Ref Range Status   SARS Coronavirus 2 by RT PCR NEGATIVE NEGATIVE Final    Comment: (NOTE) SARS-CoV-2 target nucleic acids are NOT DETECTED.  The SARS-CoV-2 RNA is  generally detectable in upper respiratory specimens during the acute phase of infection. The lowest concentration of SARS-CoV-2 viral copies this assay can detect is 138 copies/mL. A negative result does not preclude SARS-Cov-2 infection and should not be used as the sole basis for treatment or other patient management decisions. A negative result may occur with  improper specimen collection/handling, submission of specimen other than nasopharyngeal swab, presence of viral mutation(s) within the areas targeted by this assay, and inadequate number of viral copies(<138 copies/mL). A negative result must be combined with clinical observations, patient history, and epidemiological information. The expected result is Negative.  Fact Sheet for Patients:  EntrepreneurPulse.com.au  Fact Sheet for Healthcare Providers:  IncredibleEmployment.be  This test is no t yet approved or cleared by the [place] FDA and  has been authorized for detection and/or diagnosis of SARS-CoV-2 by FDA under an Emergency Use Authorization (EUA). This EUA will remain  in effect (meaning this test can be used) for the duration of the COVID-19 declaration under Section 564(b)(1) of the Act, 21 U.S.C.section 360bbb-3(b)(1), unless the authorization is terminated  or revoked sooner.       Influenza A by PCR NEGATIVE NEGATIVE Final   Influenza B by PCR NEGATIVE NEGATIVE Final    Comment: (NOTE) The Xpert Xpress SARS-CoV-2/FLU/RSV plus assay is intended as an aid in the diagnosis of influenza from Nasopharyngeal swab specimens and should not be used as a sole basis for treatment. Nasal washings and aspirates are unacceptable for Xpert Xpress SARS-CoV-2/FLU/RSV testing.  Fact Sheet for Patients: EntrepreneurPulse.com.au  Fact Sheet for Healthcare Providers: IncredibleEmployment.be  This test is not yet approved or cleared by the [place] FDA and has been authorized for detection and/or diagnosis of SARS-CoV-2 by FDA under an Emergency Use Authorization (EUA). This EUA will remain in effect (meaning this test can be used) for the duration of the COVID-19 declaration under Section 564(b)(1) of the Act, 21 U.S.C. section 360bbb-3(b)(1), unless the authorization is terminated or revoked.     Resp Syncytial Virus by PCR POSITIVE (A) NEGATIVE Final    Comment: (NOTE) Fact  Sheet for Patients: EntrepreneurPulse.com.au  Fact Sheet for Healthcare Providers: IncredibleEmployment.be  This test is not yet approved or cleared by the [place] FDA and has been authorized for detection and/or diagnosis of SARS-CoV-2 by FDA under an Emergency Use Authorization (EUA). This EUA will remain in effect (meaning this test can be used) for the duration of the COVID-19 declaration under Section 564(b)(1) of the Act, 21 U.S.C. section 360bbb-3(b)(1), unless the authorization is terminated or revoked.  Performed at [facility], [place] [address], [place], [place] [address]   Culture, blood (Routine x 2)     Status: None   Collection Time: 06/06/22 11:50 PM   Specimen: BLOOD LEFT FOREARM  Result Value Ref Range Status   Specimen Description   Final    BLOOD LEFT FOREARM Performed at [facility] [address], [place], [place] [address]    Special Requests   Final    BOTTLES DRAWN AEROBIC AND ANAEROBIC Blood Culture adequate volume Performed at [facility] [address] [place], [place] [address]    Culture   Final    NO GROWTH 5 DAYS Performed at [facility], [place] [address], [place], [place] [address]    Report Status 06/12/2022 FINAL  Final    Please note: You were cared for by a hospitalist during your hospital stay. Once you are discharged, your primary care physician will handle any further medical issues. Please  note that NO REFILLS for any discharge medications will be authorized once you are discharged, as it is imperative that you return to your primary care physician (or establish a relationship with a primary care physician if you do not have one) for your post hospital discharge needs so that they can reassess your need for medications and monitor your lab values.    Time coordinating discharge: 40 minutes  SIGNED:   Shelly Coss, MD  [facility] 06/13/2022, 11:21 AM Pager [phone]  If 7PM-7AM, please contact night-coverage [email or web address] Password [email or web address]

## 2022-06-13 NOTE — TOC Transition Note (Signed)
Transition of Care [facility]) - CM/SW Discharge Note   Patient Details  Name: [name] MRN: [id number] Date of Birth: [birth year]  [facility] CM/SW Contact:  Vassie Moselle, LCSW Phone Number: 06/13/2022, 11:54 AM   Clinical Narrative:    Pt is to return to LTC at [facility]. Pt has new O2 requirement for 1L [place]. [facility] aware of this need and are to provide O2 for pt when she returns. RN to call report to [place] station at [phone]. Spoke with pt's mother who is agreeable to pt returning to [facility]. PTAR will be arranged for transportation.    Final next level of care: Long Term Nursing Home Barriers to Discharge: No Barriers Identified   Patient Goals and CMS Choice CMS Medicare.gov Compare Post Acute Care list provided to:: Legal Guardian Choice offered to / list presented to : [place] / Guardian  Discharge Placement     Existing PASRR number confirmed : 06/11/22          Patient chooses bed at: [facility] Patient to be transferred to facility by: [facility] Name of family member notified: Mairi Kwiat Patient and family notified of of transfer: 06/13/22  Discharge Plan and Services Additional resources added to the After Visit Summary for   In-house Referral: Clinical Social Work Discharge Planning Services: NA Post Acute Care Choice: Nursing Home          DME Arranged: N/A DME Agency: NA                  Social Determinants of Health (SDOH) Interventions SDOH Screenings   Food Insecurity: No Food Insecurity (06/07/2022)  Housing: Low Risk  (06/07/2022)  Transportation Needs: No Transportation Needs (06/07/2022)  Utilities: Not At Risk (06/07/2022)  Depression (PHQ2-9): Low Risk  (03/16/2022)  Financial Resource Strain: Low Risk  (01/02/2018)  Physical Activity: Inactive (01/02/2018)  Social Connections: Moderately Integrated (01/02/2018)  Stress: Stress Concern Present (01/02/2018)  Tobacco Use: Low Risk  (06/06/2022)      Readmission Risk Interventions    06/11/2022    3:46 PM  Readmission Risk Prevention Plan  Transportation Screening Complete  PCP or Specialist Appt within 5-7 Days Complete  Home Care Screening Complete  Medication Review (RN CM) Complete

## 2022-06-22 ENCOUNTER — Emergency Department (HOSPITAL_COMMUNITY): Payer: Medicaid Other

## 2022-06-22 ENCOUNTER — Emergency Department (HOSPITAL_COMMUNITY)
Admission: EM | Admit: 2022-06-22 | Discharge: 2022-06-22 | Disposition: A | Discharge status: Skilled Nursing Facility | Payer: Medicaid Other | Attending: Emergency Medicine | Admitting: Emergency Medicine

## 2022-06-22 ENCOUNTER — Encounter (HOSPITAL_COMMUNITY): Payer: Self-pay

## 2022-06-22 DIAGNOSIS — Z91040 Latex allergy status: Secondary | ICD-10-CM | POA: Diagnosis not present

## 2022-06-22 DIAGNOSIS — G35 Multiple sclerosis: Secondary | ICD-10-CM | POA: Diagnosis not present

## 2022-06-22 DIAGNOSIS — M545 Low back pain, unspecified: Secondary | ICD-10-CM | POA: Insufficient documentation

## 2022-06-22 DIAGNOSIS — E876 Hypokalemia: Secondary | ICD-10-CM | POA: Insufficient documentation

## 2022-06-22 LAB — CBC
HCT: 32.9 % — ABNORMAL LOW (ref 36.0–46.0)
Hemoglobin: 9.6 g/dL — ABNORMAL LOW (ref 12.0–15.0)
MCH: 22.5 pg — ABNORMAL LOW (ref 26.0–34.0)
MCHC: 29.2 g/dL — ABNORMAL LOW (ref 30.0–36.0)
MCV: 77 fL — ABNORMAL LOW (ref 80.0–100.0)
Platelets: 223 10*3/uL (ref 150–400)
RBC: 4.27 MIL/uL (ref 3.87–5.11)
RDW: 18.7 % — ABNORMAL HIGH (ref 11.5–15.5)
WBC: 6.9 10*3/uL (ref 4.0–10.5)
nRBC: 0 % (ref 0.0–0.2)

## 2022-06-22 LAB — URINALYSIS, ROUTINE W REFLEX MICROSCOPIC
Bilirubin Urine: NEGATIVE
Glucose, UA: NEGATIVE mg/dL
Hgb urine dipstick: NEGATIVE
Ketones, ur: NEGATIVE mg/dL
Nitrite: POSITIVE — AB
Protein, ur: NEGATIVE mg/dL
Specific Gravity, Urine: 1.013 (ref 1.005–1.030)
pH: 6 (ref 5.0–8.0)

## 2022-06-22 LAB — BASIC METABOLIC PANEL
Anion gap: 9 (ref 5–15)
BUN: 8 mg/dL (ref 6–20)
CO2: 26 mmol/L (ref 22–32)
Calcium: 8.8 mg/dL — ABNORMAL LOW (ref 8.9–10.3)
Chloride: 100 mmol/L (ref 98–111)
Creatinine, Ser: 0.51 mg/dL (ref 0.44–1.00)
GFR, Estimated: >60 mL/min (ref >60)
Glucose, Bld: 118 mg/dL — ABNORMAL HIGH (ref 70–99)
Potassium: 3.3 mmol/L — ABNORMAL LOW (ref 3.5–5.1)
Sodium: 135 mmol/L (ref 135–145)

## 2022-06-22 MED ORDER — ONDANSETRON HCL 4 MG/2ML IJ SOLN: 4.0000 mg | Freq: Once | INTRAMUSCULAR | Status: DC

## 2022-06-22 MED ORDER — HYDROCODONE-ACETAMINOPHEN 5-325 MG PO TABS
1.0000 | ORAL_TABLET | Freq: Once | ORAL | Status: AC
  Administered 2022-06-22: 1 via ORAL
  Filled 2022-06-22: qty 1

## 2022-06-22 MED ORDER — ONDANSETRON 4 MG PO TBDP
4.0000 mg | ORAL_TABLET | Freq: Once | ORAL | Status: AC
  Administered 2022-06-22: 4 mg via ORAL
  Filled 2022-06-22: qty 1

## 2022-06-22 MED ORDER — POTASSIUM CHLORIDE CRYS ER 20 MEQ PO TBCR
40.0000 meq | EXTENDED_RELEASE_TABLET | Freq: Once | ORAL | Status: AC
  Administered 2022-06-22: 40 meq via ORAL
  Filled 2022-06-22: qty 2

## 2022-06-22 NOTE — ED Notes (Signed)
PTAR bedside to transfer pt back to [facility]. Pt has no complaints at this time.

## 2022-06-22 NOTE — ED Notes (Signed)
Unable to reach [facility] staff at this time. Will try to call them again.

## 2022-06-22 NOTE — ED Triage Notes (Signed)
Pt reports acute onsite of pain in her lower back tonite. Nursing staff gave her a tylonol with codine 20 min before she called ems. Ems reports pt called them because it was not working. Pt had a recent hospital admission.  Bp 126/78 Spo2 99 Pulse 96

## 2022-06-22 NOTE — ED Provider Notes (Signed)
[facility] Provider Note   CSN: [id number] Arrival date & time: 06/22/22  0053     History  Chief Complaint  Patient presents with   Back Pain    Notnamed [name] is a 42 y.o. female with medical history of transverse myelitis, paraplegia, multiple sclerosis, GERD, depression, bladder stones.  Patient presents to ED for evaluation of back pain.  Patient reports that tonight she was at her significant when she had sudden onset of severe back pain bilaterally.  The patient denies that the pain will locate to 1 side particularly.  Patient reports that the pain is just diffusely located all over her low back.  Patient states that this pain feels very similar to her past instances of kidney stones.  Patient has urostomy, she is unsure if she is having dysuria.  The patient denies that her urine appears to be grossly infected.  The patient denies any fevers at home, vomiting, diarrhea.  Patient reports that she has Tylenol with codeine at home, she did take this medication earlier however her pain persisted.  Patient reports that she typically gets UTIs secondary to having a urostomy bag.   Back Pain Associated symptoms: no dysuria and no fever        Home Medications Prior to Admission medications   Medication Sig Start Date End Date Taking? Authorizing Provider  acetaminophen (TYLENOL) 325 MG tablet Take 650 mg by mouth every 4 (four) hours as needed for headache, mild pain or fever. Take 650 mg by mouth two times a day and every 8 hours as needed for general discomfort    [provider]  acetaminophen-codeine (TYLENOL #3) 300-30 MG tablet Take 1 tablet by mouth every 12 (twelve) hours as needed (for severe breakthrough pain). DO NOT EXCEED 4000 MG OF TYLENOL IN A DAY    [provider]  albuterol (VENTOLIN HFA) 108 (90 Base) MCG/ACT inhaler Inhale 2 puffs into the lungs every 6 (six) hours as needed for wheezing or  shortness of breath. 06/13/22   Shelly Coss, MD  ammonium lactate (LAC-HYDRIN) 12 % lotion Apply 1 Application topically See admin instructions. Apply to bilateral feet and legs every shift    [provider]  ascorbic acid (VITAMIN C) 500 MG tablet Take 500 mg by mouth daily. 05/26/22 06/25/22  [provider]  baclofen (LIORESAL) 20 MG tablet Take 1 tablet (20 mg total) by mouth 4 (four) times daily. 02/13/19   Pieter Partridge, DO  diazepam (VALIUM) 5 MG tablet Take 1-2 tablets (5-10 mg total) by mouth 2 (two) times daily. Take 1 tablet (5 mg) BID and Take 2 tablets (10 mg) at bedtime Patient taking differently: Take 5-10 mg by mouth See admin instructions. Take 5 mg by mouth once a day and 10 mg in the evening 03/12/18   Aline August, MD  Emollient (CERAVE PM) LOTN Apply 1 application  topically See admin instructions. Apply to the face every morning after cleansing    [provider]  EPIPEN 2-PAK 0.3 MG/0.3ML SOAJ injection Inject 0.3 mg into the muscle as needed for anaphylaxis.    [provider]  fluticasone (FLONASE) 50 MCG/ACT nasal spray Place 1 spray into the nose every 12 (twelve) hours as needed for allergies or rhinitis. Instill 1 spray into each nostril once a day and an additional once a day as needed for rhinitis    [provider]  gabapentin (NEURONTIN) 300 MG capsule Take 3  caps ( 900 mg) Three times a day Patient taking differently: Take 900 mg by mouth 3 (three) times daily. 01/30/22   Jaffe, Adam R, DO  GERI-TUSSIN 100 MG/5ML liquid Take 600 mg by mouth See admin instructions. Take 600 mg by mouth four times a day from 06/06/2022 through 06/13/2022 and an additional 600 mg every 6 hours as needed for coughing    [provider]  ipratropium-albuterol (DUONEB) 0.5-2.5 (3) MG/3ML SOLN Take 3 mLs by nebulization every 4 (four) hours as needed (for wheezing).    [provider]  LACTOBACILLUS PO Take 1 capsule by mouth  daily.    [provider]  lactulose (CHRONULAC) 10 GM/15ML solution Take 15 mLs (10 g total) by mouth 3 (three) times daily. 06/13/22   Shelly Coss, MD  lamoTRIgine (LAMICTAL) 100 MG tablet Take 100 mg by mouth 2 (two) times daily.    [provider]  loratadine (CLARITIN) 10 MG tablet Take 10 mg by mouth daily.    [provider]  Multiple Vitamins-Minerals (MULTIVITAMIN WITH MINERALS) tablet Take 1 tablet by mouth daily.    [provider]  OLANZapine (ZYPREXA) 2.5 MG tablet Take 2.5 mg by mouth at bedtime.    [provider]  omeprazole (PRILOSEC) 20 MG capsule Take 20 mg by mouth in the morning.    [provider]  ondansetron (ZOFRAN) 4 MG tablet Take 4 mg by mouth every 8 (eight) hours as needed for nausea or vomiting.    [provider]  SANTYL 250 UNIT/GM ointment Apply 1 Application topically See admin instructions. Apply to bilateral heels (wounds) every dsay shift on Mon/Wed/Fri. Apply skin prep around peri-wound and cover with Duoderm dressing.    [provider]  sertraline (ZOLOFT) 100 MG tablet Take 1 tablet (100 mg total) by mouth 2 (two) times daily. 04/03/17   Bonnielee Haff, MD  urea (CARMOL) 20 % cream Apply 1 application  topically See admin instructions. Apply to both feet once a day    [provider]  zinc sulfate 220 (50 Zn) MG capsule Take 220 mg by mouth daily.    [provider]      Allergies    Iodine, Latex, Other, Polymyxin b, Shellfish-derived products, Vancomycin, Aspirin, Fish-derived products, Iodinated contrast media, and Strawberry extract    Review of Systems   Review of Systems  Constitutional:  Negative for fever.  Gastrointestinal:  Negative for nausea and vomiting.  Genitourinary:  Negative for dysuria and flank pain.  Musculoskeletal:  Positive for back pain.  All other systems reviewed and are negative.   Physical Exam Updated Vital Signs BP 99/69    Pulse 80   Temp (!) 97.5 F (36.4 C)   Resp 16   LMP 05/09/2022 (Approximate)   SpO2 99%  Physical Exam Vitals and nursing note reviewed.  Constitutional:      General: She is not in acute distress.    Appearance: Normal appearance. She is not ill-appearing, toxic-appearing or diaphoretic.  HENT:     Head: Normocephalic and atraumatic.     Nose: Nose normal.     Mouth/Throat:     Mouth: Mucous membranes are moist.     Pharynx: Oropharynx is clear.  Eyes:     Extraocular Movements: Extraocular movements intact.     Conjunctiva/sclera: Conjunctivae normal.     Pupils: Pupils are equal, round, and reactive to light.  Cardiovascular:     Rate and Rhythm: Normal rate and regular rhythm.  Pulmonary:     Effort: Pulmonary effort is normal.     Breath sounds: Normal breath sounds. No wheezing.  Abdominal:     General: Abdomen is flat. Bowel sounds are normal.     Palpations: Abdomen is soft.     Tenderness: There is no abdominal tenderness. There is no right CVA tenderness or left CVA tenderness.  Musculoskeletal:     Cervical back: Normal range of motion and neck supple. No tenderness.  Neurological:     Mental Status: She is alert.     ED Results / Procedures / Treatments   Labs (all labs ordered are listed, but only abnormal results are displayed) Labs Reviewed  URINALYSIS, ROUTINE W REFLEX MICROSCOPIC - Abnormal; Notable for the following components:      Result Value   APPearance HAZY (*)    Nitrite POSITIVE (*)    Leukocytes,Ua TRACE (*)    Bacteria, UA MANY (*)    All other components within normal limits  CBC - Abnormal; Notable for the following components:   Hemoglobin 9.6 (*)    HCT 32.9 (*)    MCV 77.0 (*)    MCH 22.5 (*)    MCHC 29.2 (*)    RDW 18.7 (*)    All other components within normal limits  BASIC METABOLIC PANEL - Abnormal; Notable for the following components:   Potassium 3.3 (*)    Glucose, Bld 118 (*)    Calcium 8.8 (*)    All other  components within normal limits  URINE CULTURE    EKG None  Radiology CT Renal Stone Study  Result Date: 06/22/2022 CLINICAL DATA:  Abdominal/flank pain, stone suspected EXAM: CT ABDOMEN AND PELVIS WITHOUT CONTRAST TECHNIQUE: Multidetector CT imaging of the abdomen and pelvis was performed following the standard protocol without IV contrast. RADIATION DOSE REDUCTION: This exam was performed according to the departmental dose-optimization program which includes automated exposure control, adjustment of the mA and/or kV according to patient size and/or use of iterative reconstruction technique. COMPARISON:  11/02/2022 FINDINGS: Lower chest: No acute abnormality Hepatobiliary: No focal liver abnormality is seen. Status post cholecystectomy. No biliary dilatation. Pancreas: No focal abnormality or ductal dilatation. Spleen: No focal abnormality.  Normal size. Adrenals/Urinary Tract: Several punctate 1-2 mm right renal stones. No hydronephrosis or ureteral stones. 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 Adrenal glands unremarkable. Stomach/Bowel: Moderate stool burden throughout the colon. Stomach, large and small bowel grossly unremarkable. Vascular/Lymphatic: No evidence of aneurysm or adenopathy. Reproductive: Uterus and adnexa unremarkable.  No mass. Other: No free fluid or free air. Musculoskeletal: No acute bony abnormality. IMPRESSION: Right nephrolithiasis.  No ureteral stones or hydronephrosis. Moderate stool burden. No acute findings. Electronically Signed   By: Rolm Baptise M.D.   On: 06/22/2022 03:28    Procedures Procedures   Medications Ordered in ED Medications  potassium chloride SA (KLOR-CON M) CR tablet 40 mEq (has no administration in time range)  HYDROcodone-acetaminophen (NORCO/VICODIN) 5-325 MG per tablet 1 tablet (1 tablet Oral Given 06/22/22 0256)  ondansetron (ZOFRAN-ODT) disintegrating tablet 4 mg (4 mg Oral Given 06/22/22 0257)     ED Course/ Medical Decision Making/ A&P  Medical Decision Making Amount and/or Complexity of Data Reviewed Labs: ordered. Radiology: ordered.  Risk Prescription drug management.   42 year old female presents to ED for evaluation.  Please see HPI for further details.  On examination the patient is afebrile and nontachycardic.  Lung sounds are clear bilaterally, she is not hypoxic.  Abdomen soft and compressible throughout.  Negative CVA tenderness bilaterally.  Patient nontoxic appearance.  CBC without leukocytosis, stable baseline hemoglobin.  BMP with slightly decreased potassium to 3.3, baseline creatinine.  Urinalysis shows hazy appearance, nitrite positive urine with trace leukocytes.  On chart review, appears the patient typically does have nitrite positive urine and then on culture will not grow anything out.  Patient CT renal stone study shows no evidence of obstructing nephrolithiasis.  Patient given 4 mg Zofran for nausea, 5 mg hydrocodone.  Patient reports that her back pain has decreased at this time.  Case discussed with attending Dr. Ralene Bathe.  At this time, we do not feel that the patient requires antibiotic medication for nitrite positive urine.  Patient typically does have nitrite positive urine.  Patient denies any gross evidence or appearance of infection in her urine, denies fevers at home.  Patient has no leukocytosis.  On further questioning, the patient reports that for the last 3 days she has been lying in uncomfortable position at her nursing [place].  Patient reports that none of the staff at the SNF will assist her in repositioning her into a position of comfort.  Patient believes that this is attributing to her back pain.  At this time, the patient is stable for discharge.  The patient will be advised to continue taking Tylenol and ibuprofen at home along with muscle relaxer which she has already been prescribed.  The patient was advised to follow-up with her PCP  for further management.  The patient was given return precautions and she voiced understanding.  The patient had all of her questions answered to her satisfaction.  The patient is stable for discharge.   Final Clinical Impression(s) / ED Diagnoses Final diagnoses:  Low back pain, unspecified back pain laterality, unspecified chronicity, unspecified whether sciatica present    Rx / DC Orders ED Discharge Orders     None         Lawana Chambers 06/22/22 0615    Quintella Reichert, MD 06/22/22 2311

## 2022-06-22 NOTE — Discharge Instructions (Signed)
Please return to the ED with any new or worsening signs or symptoms Please follow-up with your PCP for further management Please continue taking ibuprofen every 6 hours as needed for pain Please request that your SNF facility staff reposition your pillows to alleviate any back discomfort you may be experiencing

## 2022-06-24 LAB — URINE CULTURE: Culture: >=100000 — AB

## 2022-06-25 ENCOUNTER — Telehealth (HOSPITAL_BASED_OUTPATIENT_CLINIC_OR_DEPARTMENT_OTHER): Payer: Self-pay | Admitting: Emergency Medicine

## 2022-06-25 NOTE — Telephone Encounter (Signed)
Post ED Visit - Positive Culture Follow-up  Culture report reviewed by antimicrobial stewardship pharmacist: [place] Team []  Elenor Quinones, Pharm.D. []  Heide Guile, Pharm.D., BCPS AQ-ID []  Parks Neptune, Pharm.D., BCPS [x]  Alycia Rossetti, Pharm.D., BCPS []  [place], [place].D., BCPS, AAHIVP []  Legrand Como, Pharm.D., BCPS, AAHIVP []  Salome Arnt, PharmD, BCPS []  Johnnette Gourd, PharmD, BCPS []  Hughes Better, PharmD, BCPS []  Leeroy Cha, PharmD []  Laqueta Linden, PharmD, BCPS []  Albertina Parr, PharmD  [place] Team []  Leodis Sias, PharmD []  Lindell Spar, PharmD []  Royetta Asal, PharmD []  Graylin Shiver, Rph []  Rema Fendt) Glennon Mac, PharmD []  Arlyn Dunning, PharmD []  Netta Cedars, PharmD []  Dia Sitter, PharmD []  Leone Haven, PharmD []  Gretta Arab, PharmD []  Theodis Shove, PharmD []  Peggyann Juba, PharmD []  Reuel Boom, PharmD   Positive urine culture Treated with none, asymptomatic, no further patient follow-up is required at this time.  Hazle Nordmann 06/25/2022, 4:34 PM

## 2022-06-25 NOTE — Progress Notes (Signed)
ED Antimicrobial Stewardship Positive Culture Follow Up   [name] is an 42 y.o. female who presented to [facility] from SNF on 06/22/2022 with a chief complaint of severe bilateral back pain.  Chief Complaint  Patient presents with   Back Pain    Recent Results (from the past 720 hour(s))  Culture, blood (Routine x 2)     Status: None   Collection Time: 06/06/22 11:15 PM   Specimen: Right Antecubital; Blood  Result Value Ref Range Status   Specimen Description   Final    RIGHT ANTECUBITAL BLOOD Performed at [facility], [address], [place], [place] [address]    Special Requests   Final    BOTTLES DRAWN AEROBIC AND ANAEROBIC Blood Culture results may not be optimal due to an inadequate volume of blood received in culture bottles Performed at [facility] [address] [place], [place] [address]    Culture   Final    NO GROWTH 5 DAYS Performed at [facility], [place] [address], [place], [place] [address]    Report Status 06/12/2022 FINAL  Final  Resp panel by RT-PCR (RSV, Flu A&B, Covid) Anterior Nasal Swab     Status: Abnormal   Collection Time: 06/06/22 11:15 PM   Specimen: Anterior Nasal Swab  Result Value Ref Range Status   SARS Coronavirus 2 by RT PCR NEGATIVE NEGATIVE Final    Comment: (NOTE) SARS-CoV-2 target nucleic acids are NOT DETECTED.  The SARS-CoV-2 RNA is generally detectable in upper respiratory specimens during the acute phase of infection. The lowest concentration of SARS-CoV-2 viral copies this assay can detect is 138 copies/mL. A negative result does not preclude SARS-Cov-2 infection and should not be used as the sole basis for treatment or other patient management decisions. A negative result may occur with  improper specimen collection/handling, submission of specimen other than nasopharyngeal swab, presence of viral mutation(s) within the areas targeted by this assay, and inadequate number of  viral copies(<138 copies/mL). A negative result must be combined with clinical observations, patient history, and epidemiological information. The expected result is Negative.  Fact Sheet for Patients:  EntrepreneurPulse.com.au  Fact Sheet for Healthcare Providers:  IncredibleEmployment.be  This test is no t yet approved or cleared by the [place] FDA and  has been authorized for detection and/or diagnosis of SARS-CoV-2 by FDA under an Emergency Use Authorization (EUA). This EUA will remain  in effect (meaning this test can be used) for the duration of the COVID-19 declaration under Section 564(b)(1) of the Act, 21 U.S.C.section 360bbb-3(b)(1), unless the authorization is terminated  or revoked sooner.       Influenza A by PCR NEGATIVE NEGATIVE Final   Influenza B by PCR NEGATIVE NEGATIVE Final    Comment: (NOTE) The Xpert Xpress SARS-CoV-2/FLU/RSV plus assay is intended as an aid in the diagnosis of influenza from Nasopharyngeal swab specimens and should not be used as a sole basis for treatment. Nasal washings and aspirates are unacceptable for Xpert Xpress SARS-CoV-2/FLU/RSV testing.  Fact Sheet for Patients: EntrepreneurPulse.com.au  Fact Sheet for Healthcare Providers: IncredibleEmployment.be  This test is not yet approved or cleared by the [place] FDA and has been authorized for detection and/or diagnosis of SARS-CoV-2 by FDA under an Emergency Use Authorization (EUA). This EUA will remain in effect (meaning this test can be used) for the duration of the COVID-19 declaration under Section 564(b)(1) of the Act, 21 U.S.C. section 360bbb-3(b)(1), unless the authorization is  terminated or revoked.     Resp Syncytial Virus by PCR POSITIVE (A) NEGATIVE Final    Comment: (NOTE) Fact Sheet for Patients: EntrepreneurPulse.com.au  Fact Sheet for Healthcare  Providers: IncredibleEmployment.be  This test is not yet approved or cleared by the [place] FDA and has been authorized for detection and/or diagnosis of SARS-CoV-2 by FDA under an Emergency Use Authorization (EUA). This EUA will remain in effect (meaning this test can be used) for the duration of the COVID-19 declaration under Section 564(b)(1) of the Act, 21 U.S.C. section 360bbb-3(b)(1), unless the authorization is terminated or revoked.  Performed at [facility], [place] [address], [place], [place] [address]   Culture, blood (Routine x 2)     Status: None   Collection Time: 06/06/22 11:50 PM   Specimen: BLOOD LEFT FOREARM  Result Value Ref Range Status   Specimen Description   Final    BLOOD LEFT FOREARM Performed at [facility] [address], [place], [place] [address]    Special Requests   Final    BOTTLES DRAWN AEROBIC AND ANAEROBIC Blood Culture adequate volume Performed at [facility] [address] [place], [place] [address]    Culture   Final    NO GROWTH 5 DAYS Performed at [facility], [place] [address] [place], [place] [address]    Report Status 06/12/2022 FINAL  Final  Urine Culture (for pregnant, neutropenic or urologic patients or patients with an indwelling urinary catheter)     Status: Abnormal   Collection Time: 06/22/22  1:50 AM   Specimen: Urine, Catheterized  Result Value Ref Range Status   Specimen Description   Final    URINE, CATHETERIZED Performed at [facility] [address], [place], [place] [address]    Special Requests   Final    NONE Performed at [facility], [place] [address] [place], [place] [address]    Culture (A)  Final    >=100,000 COLONIES/mL PSEUDOMONAS AERUGINOSA >=100,000 COLONIES/mL MORGANELLA MORGANII    Report Status 06/24/2022 FINAL  Final   Organism ID, Bacteria PSEUDOMONAS AERUGINOSA (A)  Final    Organism ID, Bacteria MORGANELLA MORGANII (A)  Final      Susceptibility   Morganella morganii - MIC*    AMPICILLIN >=32 RESISTANT Resistant     CIPROFLOXACIN >=4 RESISTANT Resistant     GENTAMICIN <=1 SENSITIVE Sensitive     IMIPENEM 1 SENSITIVE Sensitive     NITROFURANTOIN 128 RESISTANT Resistant     TRIMETH/SULFA <=20 SENSITIVE Sensitive     AMPICILLIN/SULBACTAM >=32 RESISTANT Resistant     PIP/TAZO <=4 SENSITIVE Sensitive     * >=100,000 COLONIES/mL MORGANELLA MORGANII   Pseudomonas aeruginosa - MIC*    CEFTAZIDIME 2 SENSITIVE Sensitive     CIPROFLOXACIN INTERMEDIATE Intermediate     GENTAMICIN <=1 SENSITIVE Sensitive     IMIPENEM 1 SENSITIVE Sensitive     CEFEPIME 2 SENSITIVE Sensitive     * >=100,000 COLONIES/mL PSEUDOMONAS AERUGINOSA    [x]  Treatment with antimicrobial agent is not indicated   Pt presenting with severe bilateral back pain thought to be of musculoskeletal origin due to uncomfortable positioning at SNF for several days. PMH of MS with paraplegia and urostomy and known renal stones. Stable, afebrile, without leukocytosis. CT renal without evidence of obstructing nephrolithiasis or pyelonephritis. Negative CVA tenderness. UA with trace LE, positive nitrite, many bacteria, and 6-10 WBC c/w prior studies. 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 Pt was discharged without antimicrobial therapy which is appropriate given lack of urinary or systemic symptoms of infection.   Plan: Continue to hold abx. Treatment not indicated.  ED Provider: Garnette Gunner, MD  Park Liter, PharmD Candidate 06/25/2022, 10:23 AM Clinical Pharmacist Monday - Friday phone -  [phone] Saturday - Sunday phone - [phone]

## 2022-06-29 ENCOUNTER — Encounter: Payer: Self-pay | Admitting: Family Medicine

## 2022-07-03 ENCOUNTER — Encounter: Payer: Self-pay | Admitting: Neurology

## 2022-07-26 ENCOUNTER — Encounter: Payer: Self-pay | Admitting: Neurology

## 2022-10-16 ENCOUNTER — Encounter: Payer: Self-pay | Admitting: Family Medicine

## 2022-11-09 ENCOUNTER — Other Ambulatory Visit: Payer: Self-pay | Admitting: Family Medicine

## 2022-11-09 DIAGNOSIS — D241 Benign neoplasm of right breast: Secondary | ICD-10-CM

## 2022-12-07 ENCOUNTER — Ambulatory Visit
Admission: RE | Admit: 2022-12-07 | Discharge: 2022-12-07 | Disposition: A | Discharge status: Home / Self Care | Payer: Medicaid Other | Source: Ambulatory Visit | Attending: Family Medicine | Admitting: Family Medicine

## 2022-12-07 ENCOUNTER — Other Ambulatory Visit: Payer: Self-pay | Admitting: Family Medicine

## 2022-12-07 DIAGNOSIS — D241 Benign neoplasm of right breast: Secondary | ICD-10-CM

## 2022-12-07 DIAGNOSIS — Z1231 Encounter for screening mammogram for malignant neoplasm of breast: Secondary | ICD-10-CM

## 2023-01-07 ENCOUNTER — Ambulatory Visit: Payer: Medicaid Other | Admitting: Family Medicine

## 2023-01-15 ENCOUNTER — Encounter: Payer: Self-pay | Admitting: Obstetrics and Gynecology

## 2023-01-22 ENCOUNTER — Encounter: Payer: Self-pay | Admitting: Neurology

## 2023-02-12 ENCOUNTER — Encounter (HOSPITAL_COMMUNITY): Payer: Self-pay

## 2023-02-12 ENCOUNTER — Emergency Department (HOSPITAL_COMMUNITY)
Admission: EM | Admit: 2023-02-12 | Discharge: 2023-02-13 | Disposition: A | Discharge status: Skilled Nursing Facility | Payer: Medicaid Other | Attending: Emergency Medicine | Admitting: Emergency Medicine

## 2023-02-12 ENCOUNTER — Emergency Department (HOSPITAL_COMMUNITY): Payer: Medicaid Other

## 2023-02-12 ENCOUNTER — Other Ambulatory Visit: Payer: Self-pay

## 2023-02-12 DIAGNOSIS — D72829 Elevated white blood cell count, unspecified: Secondary | ICD-10-CM | POA: Diagnosis not present

## 2023-02-12 DIAGNOSIS — Z20822 Contact with and (suspected) exposure to covid-19: Secondary | ICD-10-CM | POA: Insufficient documentation

## 2023-02-12 DIAGNOSIS — R0602 Shortness of breath: Secondary | ICD-10-CM | POA: Diagnosis present

## 2023-02-12 DIAGNOSIS — J189 Pneumonia, unspecified organism: Secondary | ICD-10-CM | POA: Insufficient documentation

## 2023-02-12 DIAGNOSIS — Z91040 Latex allergy status: Secondary | ICD-10-CM | POA: Insufficient documentation

## 2023-02-12 LAB — CBC WITH DIFFERENTIAL/PLATELET
Abs Immature Granulocytes: 0.03 10*3/uL (ref 0.00–0.07)
Basophils Absolute: 0 10*3/uL (ref 0.0–0.1)
Basophils Relative: 0 %
Eosinophils Absolute: 0.3 10*3/uL (ref 0.0–0.5)
Eosinophils Relative: 2 %
HCT: 37.8 % (ref 36.0–46.0)
Hemoglobin: 11.5 g/dL — ABNORMAL LOW (ref 12.0–15.0)
Immature Granulocytes: 0 %
Lymphocytes Relative: 16 %
Lymphs Abs: 1.8 10*3/uL (ref 0.7–4.0)
MCH: 23.1 pg — ABNORMAL LOW (ref 26.0–34.0)
MCHC: 30.4 g/dL (ref 30.0–36.0)
MCV: 76.1 fL — ABNORMAL LOW (ref 80.0–100.0)
Monocytes Absolute: 0.5 10*3/uL (ref 0.1–1.0)
Monocytes Relative: 5 %
Neutro Abs: 8.5 10*3/uL — ABNORMAL HIGH (ref 1.7–7.7)
Neutrophils Relative %: 77 %
Platelets: 214 10*3/uL (ref 150–400)
RBC: 4.97 MIL/uL (ref 3.87–5.11)
RDW: 18.6 % — ABNORMAL HIGH (ref 11.5–15.5)
WBC: 11.1 10*3/uL — ABNORMAL HIGH (ref 4.0–10.5)
nRBC: 0 % (ref 0.0–0.2)

## 2023-02-12 LAB — RESP PANEL BY RT-PCR (RSV, FLU A&B, COVID)  RVPGX2
Influenza A by PCR: NEGATIVE
Influenza B by PCR: NEGATIVE
Resp Syncytial Virus by PCR: NEGATIVE
SARS Coronavirus 2 by RT PCR: NEGATIVE

## 2023-02-12 MED ORDER — IPRATROPIUM-ALBUTEROL 0.5-2.5 (3) MG/3ML IN SOLN
3.0000 mL | Freq: Once | RESPIRATORY_TRACT | Status: AC
  Administered 2023-02-12: 3 mL via RESPIRATORY_TRACT
  Filled 2023-02-12: qty 3

## 2023-02-12 NOTE — ED Provider Notes (Signed)
[place] EMERGENCY DEPARTMENT AT [facility] Provider Note   CSN: [id number] Arrival date & time: 02/12/23  2154     History  Chief Complaint  Patient presents with   Cough   Nasal Congestion   Shortness of Breath   Chills    [name] is a 42 y.o. female.  Patient presents the emergency room complaining of cough, congestion, shortness of breath for the past 3 days.  She endorses chills but denies known fevers.  Patient denies chest pain, abdominal pain, nausea, vomiting.  Past medical history significant for paraplegia, MS   Cough Associated symptoms: shortness of breath   Shortness of Breath Associated symptoms: cough        Home Medications Prior to Admission medications   Medication Sig Start Date End Date Taking? Authorizing Provider  amoxicillin-clavulanate (AUGMENTIN) 875-125 MG tablet Take 1 tablet by mouth every 12 (twelve) hours. 02/13/23  Yes Darrick Grinder, PA-C  doxycycline (VIBRAMYCIN) 100 MG capsule Take 1 capsule (100 mg total) by mouth 2 (two) times daily. 02/13/23  Yes Darrick Grinder, PA-C  acetaminophen (TYLENOL) 325 MG tablet Take 650 mg by mouth every 4 (four) hours as needed for headache, mild pain or fever. Take 650 mg by mouth two times a day and every 8 hours as needed for general discomfort    [provider]  acetaminophen-codeine (TYLENOL #3) 300-30 MG tablet Take 1 tablet by mouth every 12 (twelve) hours as needed (for severe breakthrough pain). DO NOT EXCEED 4000 MG OF TYLENOL IN A DAY    [provider]  albuterol (VENTOLIN HFA) 108 (90 Base) MCG/ACT inhaler Inhale 2 puffs into the lungs every 6 (six) hours as needed for wheezing or shortness of breath. 06/13/22   Burnadette Pop, MD  ammonium lactate (LAC-HYDRIN) 12 % lotion Apply 1 Application topically See admin instructions. Apply to bilateral feet and legs every shift    [provider]  baclofen (LIORESAL) 20 MG tablet Take 1 tablet  (20 mg total) by mouth 4 (four) times daily. 02/13/19   Drema Dallas, DO  diazepam (VALIUM) 5 MG tablet Take 1-2 tablets (5-10 mg total) by mouth 2 (two) times daily. Take 1 tablet (5 mg) BID and Take 2 tablets (10 mg) at bedtime Patient taking differently: Take 5-10 mg by mouth See admin instructions. Take 5 mg by mouth once a day and 10 mg in the evening 03/12/18   Glade Lloyd, MD  Emollient (CERAVE PM) LOTN Apply 1 application  topically See admin instructions. Apply to the face every morning after cleansing    [provider]  EPIPEN 2-PAK 0.3 MG/0.3ML SOAJ injection Inject 0.3 mg into the muscle as needed for anaphylaxis.    [provider]  fluticasone (FLONASE) 50 MCG/ACT nasal spray Place 1 spray into the nose every 12 (twelve) hours as needed for allergies or rhinitis. Instill 1 spray into each nostril once a day and an additional once a day as needed for rhinitis    [provider]  gabapentin (NEURONTIN) 300 MG capsule Take 3 caps ( 900 mg) Three times a day Patient taking differently: Take 900 mg by mouth 3 (three) times daily. 01/30/22   Jaffe, Adam R, DO  GERI-TUSSIN 100 MG/5ML liquid Take 600 mg by mouth See admin instructions. Take 600 mg by mouth four times a day from 06/06/2022 through 06/13/2022 and an additional 600 mg every 6 hours as needed for coughing    [provider]  ipratropium-albuterol (DUONEB) 0.5-2.5 (3) MG/3ML SOLN Take 3 mLs by nebulization every 4 (four) hours as needed (for wheezing).    [provider]  LACTOBACILLUS PO Take 1 capsule by mouth daily.    [provider]  lactulose (CHRONULAC) 10 GM/15ML solution Take 15 mLs (10 g total) by mouth 3 (three) times daily. 06/13/22   Burnadette Pop, MD  lamoTRIgine (LAMICTAL) 100 MG tablet Take 100 mg by mouth 2 (two) times daily.    [provider]  loratadine (CLARITIN) 10 MG tablet Take 10 mg by mouth daily.    [provider]  Multiple  Vitamins-Minerals (MULTIVITAMIN WITH MINERALS) tablet Take 1 tablet by mouth daily.    [provider]  OLANZapine (ZYPREXA) 2.5 MG tablet Take 2.5 mg by mouth at bedtime.    [provider]  omeprazole (PRILOSEC) 20 MG capsule Take 20 mg by mouth in the morning.    [provider]  ondansetron (ZOFRAN) 4 MG tablet Take 4 mg by mouth every 8 (eight) hours as needed for nausea or vomiting.    [provider]  SANTYL 250 UNIT/GM ointment Apply 1 Application topically See admin instructions. Apply to bilateral heels (wounds) every dsay shift on Mon/Wed/Fri. Apply skin prep around peri-wound and cover with Duoderm dressing.    [provider]  sertraline (ZOLOFT) 100 MG tablet Take 1 tablet (100 mg total) by mouth 2 (two) times daily. 04/03/17   Osvaldo Shipper, MD  urea (CARMOL) 20 % cream Apply 1 application  topically See admin instructions. Apply to both feet once a day    [provider]  zinc sulfate 220 (50 Zn) MG capsule Take 220 mg by mouth daily.    [provider]      Allergies    Iodine, Latex, Other, Polymyxin b, Shellfish-derived products, Vancomycin, Aspirin, Fish-derived products, Iodinated contrast media, and Strawberry extract    Review of Systems   Review of Systems  Respiratory:  Positive for cough and shortness of breath.     Physical Exam Updated Vital Signs BP 124/83 (BP Location: Left Arm)   Pulse 89   Temp 98.2 F (36.8 C) (Oral)   Resp 18   Ht 5\' 3"  (1.6 m)   Wt 75.5 kg   SpO2 96%   BMI 29.49 kg/m  Physical Exam Vitals and nursing note reviewed.  Constitutional:      General: She is not in acute distress.    Appearance: She is well-developed.  HENT:     Head: Normocephalic and atraumatic.  Eyes:     Conjunctiva/sclera: Conjunctivae normal.  Cardiovascular:     Rate and Rhythm: Normal rate and regular rhythm.  Pulmonary:     Effort: Pulmonary effort is normal. No respiratory distress.      Breath sounds: Rhonchi present.  Chest:     Chest wall: No tenderness.  Abdominal:     Palpations: Abdomen is soft.     Tenderness: There is no abdominal tenderness.  Musculoskeletal:        General: No swelling.     Cervical back: Neck supple.  Skin:    General: Skin is warm and dry.     Capillary Refill: Capillary refill takes less than 2 seconds.  Neurological:     Mental Status: She is alert.  Psychiatric:        Mood and Affect: Mood normal.     ED Results / Procedures / Treatments   Labs (all labs ordered are  listed, but only abnormal results are displayed) Labs Reviewed  URINALYSIS, ROUTINE W REFLEX MICROSCOPIC - Abnormal; Notable for the following components:      Result Value   APPearance HAZY (*)    Leukocytes,Ua LARGE (*)    Bacteria, UA RARE (*)    All other components within normal limits  BASIC METABOLIC PANEL - Abnormal; Notable for the following components:   Glucose, Bld 105 (*)    Creatinine, Ser 0.40 (*)    All other components within normal limits  CBC WITH DIFFERENTIAL/PLATELET - Abnormal; Notable for the following components:   WBC 11.1 (*)    Hemoglobin 11.5 (*)    MCV 76.1 (*)    MCH 23.1 (*)    RDW 18.6 (*)    Neutro Abs 8.5 (*)    All other components within normal limits  RESP PANEL BY RT-PCR (RSV, FLU A&B, COVID)  RVPGX2    EKG None  Radiology DG Chest 2 View  Result Date: 02/13/2023 CLINICAL DATA:  Cough, congestion and shortness of breath. EXAM: CHEST - 2 VIEW COMPARISON:  June 10, 2022 FINDINGS: Limited study secondary to limited patient positioning. The heart size and mediastinal contours are within normal limits. Mild, hazy atelectasis and/or infiltrate is suspected within the retrocardiac region of the left lung base. The visualized skeletal structures are unremarkable. IMPRESSION: Limited study with suspected mild left basilar atelectasis and/or infiltrate. Electronically Signed   By: Aram Candela M.D.   On: 02/13/2023 00:33     Procedures Procedures    Medications Ordered in ED Medications  ipratropium-albuterol (DUONEB) 0.5-2.5 (3) MG/3ML nebulizer solution 3 mL (3 mLs Nebulization Given 02/12/23 2329)    ED Course/ Medical Decision Making/ A&P                                 Medical Decision Making Amount and/or Complexity of Data Reviewed Labs: ordered. Radiology: ordered.  Risk Prescription drug management.   This patient presents to the ED for concern of cough, congestion, shortness of breath, this involves an extensive number of treatment options, and is a complaint that carries with it a high risk of complications and morbidity.  The differential diagnosis includes viral illness, pneumonia, others   Co morbidities that complicate the patient evaluation  Paraplegia   Additional history obtained:  Additional history obtained from EMS External records from outside source obtained and reviewed including discharge summary from March of this year after patient was admitted due to RSV with hypoxia   Lab Tests:  I Ordered, and personally interpreted labs.  The pertinent results include: WBC 11.1   Imaging Studies ordered:  I ordered imaging studies including chest x-ray I independently visualized and interpreted imaging which showed possible infiltrate versus atelectasis in the left lower lobe I agree with the radiologist interpretation    Problem List / ED Course / Critical interventions / Medication management   I ordered medication including DuoNeb for respiratory difficulty Reevaluation of the patient after these medicines showed that the patient improved I have reviewed the patients home medicines and have made adjustments as needed   Social Determinants of Health:  Patient has Medicaid for her primary health insurance, currently resides in a rehab facility   Test / Admission - Considered:  Patient maintaining oxygen saturations on room air of 95+ percent.  Possible  pneumonia noted on chest x-ray.  With patient's history of being immobile and in a rehab  facility we will treat for presumed pneumonia.  Antibiotics scripts printed to be sent back with patient.  Viral panel was negative.  No indication for admission or further emergent workup.  Discharge at this time.         Final Clinical Impression(s) / ED Diagnoses Final diagnoses:  Community acquired pneumonia of left lung, unspecified part of lung    Rx / DC Orders ED Discharge Orders          Ordered    amoxicillin-clavulanate (AUGMENTIN) 875-125 MG tablet  Every 12 hours        02/13/23 0155    doxycycline (VIBRAMYCIN) 100 MG capsule  2 times daily        02/13/23 0155              Pamala Duffel 02/13/23 0159    Nira Conn, MD 02/13/23 [phone]

## 2023-02-12 NOTE — ED Triage Notes (Signed)
Per EMS  Cough Congestion SOB Xray negative at facility Given duoneb with no relief Chills

## 2023-02-12 NOTE — ED Provider Notes (Incomplete)
[name] EMERGENCY DEPARTMENT AT [facility] Provider Note   CSN: [id number] Arrival date & time: 02/12/23  2154     History {Add pertinent medical, surgical, social history, OB history to HPI:1} Chief Complaint  Patient presents with  . Cough  . Nasal Congestion  . Shortness of Breath  . Chills    [name] is a 42 y.o. female.   Cough Associated symptoms: shortness of breath   Shortness of Breath Associated symptoms: cough        Home Medications Prior to Admission medications   Medication Sig Start Date End Date Taking? Authorizing Provider  acetaminophen (TYLENOL) 325 MG tablet Take 650 mg by mouth every 4 (four) hours as needed for headache, mild pain or fever. Take 650 mg by mouth two times a day and every 8 hours as needed for general discomfort    [provider]  acetaminophen-codeine (TYLENOL #3) 300-30 MG tablet Take 1 tablet by mouth every 12 (twelve) hours as needed (for severe breakthrough pain). DO NOT EXCEED 4000 MG OF TYLENOL IN A DAY    [provider]  albuterol (VENTOLIN HFA) 108 (90 Base) MCG/ACT inhaler Inhale 2 puffs into the lungs every 6 (six) hours as needed for wheezing or shortness of breath. 06/13/22   Burnadette Pop, MD  ammonium lactate (LAC-HYDRIN) 12 % lotion Apply 1 Application topically See admin instructions. Apply to bilateral feet and legs every shift    [provider]  baclofen (LIORESAL) 20 MG tablet Take 1 tablet (20 mg total) by mouth 4 (four) times daily. 02/13/19   Drema Dallas, DO  diazepam (VALIUM) 5 MG tablet Take 1-2 tablets (5-10 mg total) by mouth 2 (two) times daily. Take 1 tablet (5 mg) BID and Take 2 tablets (10 mg) at bedtime Patient taking differently: Take 5-10 mg by mouth See admin instructions. Take 5 mg by mouth once a day and 10 mg in the evening 03/12/18   Glade Lloyd, MD  Emollient (CERAVE PM) LOTN Apply 1 application  topically See admin instructions.  Apply to the face every morning after cleansing    [provider]  EPIPEN 2-PAK 0.3 MG/0.3ML SOAJ injection Inject 0.3 mg into the muscle as needed for anaphylaxis.    [provider]  fluticasone (FLONASE) 50 MCG/ACT nasal spray Place 1 spray into the nose every 12 (twelve) hours as needed for allergies or rhinitis. Instill 1 spray into each nostril once a day and an additional once a day as needed for rhinitis    [provider]  gabapentin (NEURONTIN) 300 MG capsule Take 3 caps ( 900 mg) Three times a day Patient taking differently: Take 900 mg by mouth 3 (three) times daily. 01/30/22   Jaffe, Adam R, DO  GERI-TUSSIN 100 MG/5ML liquid Take 600 mg by mouth See admin instructions. Take 600 mg by mouth four times a day from 06/06/2022 through 06/13/2022 and an additional 600 mg every 6 hours as needed for coughing    [provider]  ipratropium-albuterol (DUONEB) 0.5-2.5 (3) MG/3ML SOLN Take 3 mLs by nebulization every 4 (four) hours as needed (for wheezing).    [provider]  LACTOBACILLUS PO Take 1 capsule by mouth daily.    [provider]  lactulose (CHRONULAC) 10 GM/15ML solution Take 15 mLs (10 g total) by mouth 3 (three) times daily. 06/13/22   Burnadette Pop, MD  lamoTRIgine (LAMICTAL) 100 MG tablet Take 100 mg by mouth 2 (two) times daily.  [provider]  loratadine (CLARITIN) 10 MG tablet Take 10 mg by mouth daily.    [provider]  Multiple Vitamins-Minerals (MULTIVITAMIN WITH MINERALS) tablet Take 1 tablet by mouth daily.    [provider]  OLANZapine (ZYPREXA) 2.5 MG tablet Take 2.5 mg by mouth at bedtime.    [provider]  omeprazole (PRILOSEC) 20 MG capsule Take 20 mg by mouth in the morning.    [provider]  ondansetron (ZOFRAN) 4 MG tablet Take 4 mg by mouth every 8 (eight) hours as needed for nausea or vomiting.    [provider]  SANTYL 250 UNIT/GM ointment  Apply 1 Application topically See admin instructions. Apply to bilateral heels (wounds) every dsay shift on Mon/Wed/Fri. Apply skin prep around peri-wound and cover with Duoderm dressing.    [provider]  sertraline (ZOLOFT) 100 MG tablet Take 1 tablet (100 mg total) by mouth 2 (two) times daily. 04/03/17   Osvaldo Shipper, MD  urea (CARMOL) 20 % cream Apply 1 application  topically See admin instructions. Apply to both feet once a day    [provider]  zinc sulfate 220 (50 Zn) MG capsule Take 220 mg by mouth daily.    [provider]      Allergies    Iodine, Latex, Other, Polymyxin b, Shellfish-derived products, Vancomycin, Aspirin, Fish-derived products, Iodinated contrast media, and Strawberry extract    Review of Systems   Review of Systems  Respiratory:  Positive for cough and shortness of breath.     Physical Exam Updated Vital Signs BP 110/80   Pulse 93   Temp 98.8 F (37.1 C) (Oral)   Resp 18   Ht 5\' 3"  (1.6 m)   Wt 75.5 kg   SpO2 95%   BMI 29.49 kg/m  Physical Exam  ED Results / Procedures / Treatments   Labs (all labs ordered are listed, but only abnormal results are displayed) Labs Reviewed  RESP PANEL BY RT-PCR (RSV, FLU A&B, COVID)  RVPGX2  URINALYSIS, ROUTINE W REFLEX MICROSCOPIC  BASIC METABOLIC PANEL  CBC WITH DIFFERENTIAL/PLATELET    EKG None  Radiology No results found.  Procedures Procedures  {Document cardiac monitor, telemetry assessment procedure when appropriate:1}  Medications Ordered in ED Medications  ipratropium-albuterol (DUONEB) 0.5-2.5 (3) MG/3ML nebulizer solution 3 mL (3 mLs Nebulization Given 02/12/23 2329)    ED Course/ Medical Decision Making/ A&P   {   Click here for ABCD2, HEART and other calculatorsREFRESH Note before signing :1}                              Medical Decision Making Amount and/or Complexity of Data Reviewed Labs: ordered. Radiology: ordered.  Risk Prescription drug  management.   ***  {Document critical care time when appropriate:1} {Document review of labs and clinical decision tools ie heart score, Chads2Vasc2 etc:1}  {Document your independent review of radiology images, and any outside records:1} {Document your discussion with family members, caretakers, and with consultants:1} {Document social determinants of health affecting pt's care:1} {Document your decision making why or why not admission, treatments were needed:1} Final Clinical Impression(s) / ED Diagnoses Final diagnoses:  None    Rx / DC Orders ED Discharge Orders     None

## 2023-02-13 LAB — URINALYSIS, ROUTINE W REFLEX MICROSCOPIC
Bilirubin Urine: NEGATIVE
Glucose, UA: NEGATIVE mg/dL
Hgb urine dipstick: NEGATIVE
Ketones, ur: NEGATIVE mg/dL
Nitrite: NEGATIVE
Protein, ur: NEGATIVE mg/dL
Specific Gravity, Urine: 1.008 (ref 1.005–1.030)
pH: 7 (ref 5.0–8.0)

## 2023-02-13 LAB — BASIC METABOLIC PANEL
Anion gap: 9 (ref 5–15)
BUN: 8 mg/dL (ref 6–20)
CO2: 25 mmol/L (ref 22–32)
Calcium: 9.1 mg/dL (ref 8.9–10.3)
Chloride: 101 mmol/L (ref 98–111)
Creatinine, Ser: 0.4 mg/dL — ABNORMAL LOW (ref 0.44–1.00)
GFR, Estimated: >60 mL/min (ref >60)
Glucose, Bld: 105 mg/dL — ABNORMAL HIGH (ref 70–99)
Potassium: 3.6 mmol/L (ref 3.5–5.1)
Sodium: 135 mmol/L (ref 135–145)

## 2023-02-13 MED ORDER — DOXYCYCLINE HYCLATE 100 MG PO CAPS: 100.0000 mg | ORAL_CAPSULE | Freq: Two times a day (BID) | ORAL | 0 refills | Status: DC

## 2023-02-13 MED ORDER — AMOXICILLIN-POT CLAVULANATE 875-125 MG PO TABS: 1.0000 | ORAL_TABLET | Freq: Two times a day (BID) | ORAL | 0 refills | Status: DC

## 2023-02-13 NOTE — ED Notes (Signed)
RN attempted to call [facility] again to give report with no answer

## 2023-02-13 NOTE — Discharge Instructions (Addendum)
You were diagnosed tonight with a pneumonia.  Please take the prescribed antibiotics.  Follow-up with your primary team for further evaluation as needed.

## 2023-02-13 NOTE — ED Notes (Signed)
Attempted to call Cheyenne Adas to give report with no answer.

## 2023-02-13 NOTE — ED Notes (Signed)
RN asked pt if they had the information of their legal guardian. Pt stated she does not have a legal guardian at the moment because her mother isn't able to at this moment.

## 2023-02-14 ENCOUNTER — Encounter: Payer: Self-pay | Admitting: Obstetrics and Gynecology

## 2023-02-14 ENCOUNTER — Ambulatory Visit: Payer: Medicaid Other | Admitting: Obstetrics and Gynecology

## 2023-02-14 ENCOUNTER — Other Ambulatory Visit (HOSPITAL_COMMUNITY)
Admission: RE | Admit: 2023-02-14 | Discharge: 2023-02-14 | Disposition: A | Discharge status: Home / Self Care | Payer: Medicaid Other | Source: Ambulatory Visit | Attending: Family Medicine | Admitting: Family Medicine

## 2023-02-14 VITALS — BP 111/73 | HR 81

## 2023-02-14 DIAGNOSIS — N898 Other specified noninflammatory disorders of vagina: Secondary | ICD-10-CM | POA: Insufficient documentation

## 2023-02-14 NOTE — Patient Instructions (Signed)
Today we did a swab for vaginal infection  It appears the skin of the lower left labia has been dragged towards the sitting bone but otherwise no wound evident   Your pap is due in 2025. If there are continued concerns, you should return sooner to be re-evaluated

## 2023-02-14 NOTE — Progress Notes (Signed)
NEW GYNECOLOGY PATIENT Patient name: [name] MRN [id number]  Date of birth: 1981/03/15 Chief Complaint:   No chief complaint on file.     History:  Berdina Mae Cardy is a 42 y.o. G0P0000 being seen today for assessment of labia.    Patient notes that she has been told big information regarding what is going on with her perineum area.  Patient has urostomy tube, for which a wound nurse comes in and manages approximately 1 time a week.  Unclear how often she may be able to get additional wound care checks.  She has little feeling around her perineum.  Notes that typically bowel movements are natural, but sometimes she needs assistance.  Has bowel movements within a diaper, and there is how long it may take for her to be changed due to staffing issues at the facility where she is.  She notes she may be with in her soiled brief for several hours after pushing the call bell.  Patient notes that she continues to have regular menses, next menses anticipated next week.  She states her mother does not recall her having any malformation around her labia.  She is not sure if there is a wound or other issue on the labia she is not able to visualize herself, and states that staff is not very clear with her.  She has not noted any pain.  She is not sure if there is discharge.  At her facility has a labial pressure wound     Gynecologic History No LMP recorded. (Menstrual status: Irregular Periods). Contraception: abstinence Last Pap:     Component Value Date/Time   DIAGPAP (A) 09/14/2020 1131    - Atypical squamous cells of undetermined significance (ASC-US)   DIAGPAP  01/28/2017 0000    NEGATIVE FOR INTRAEPITHELIAL LESIONS OR MALIGNANCY. BENIGN REACTIVE/REPARATIVE CHANGES.   HPVHIGH Negative 09/14/2020 1131   ADEQPAP  09/14/2020 1131    Satisfactory for evaluation; transformation zone component PRESENT.   ADEQPAP  01/28/2017 0000    Satisfactory for evaluation   endocervical/transformation zone component PRESENT.    High Risk HPV: Positive  Adequacy:  Satisfactory for evaluation, transformation zone component PRESENT  Diagnosis:  Atypical squamous cells of undetermined significance (ASC-US)  Last Mammogram: 09/2022 BIR[facility] 3 Last Colonoscopy: n/a  Obstetric History OB History  Gravida Para Term Preterm AB Living  0 0 0 0 0 0  SAB IAB Ectopic Multiple Live Births  0 0 0 0 0    Past Medical History:  Diagnosis Date   Bladder stones    Hx of   Depression    GERD (gastroesophageal reflux disease)    Intertrigo 04/24/2017   Left upper extremity swelling 04/24/2017   Multiple sclerosis (HCC)    Paraplegia (HCC)    Polycystic disease, ovaries    PTSD (post-traumatic stress disorder)    Transverse myelitis (HCC)    Yeast infection 09/26/2017    Past Surgical History:  Procedure Laterality Date   BREAST BIOPSY Right 05/22/2022   [place] RT BREAST BX W LOC DEV 1ST LESION IMG BX SPEC US GUIDE 05/22/2022 [facility]   CHOLECYSTECTOMY     CYSTECTOMY W/ URETEROILEAL CONDUIT     IR FLUORO GUIDE CV LINE LEFT  02/25/2017   IR NEPHROURETERAL CATH PLACE RIGHT  10/20/2021   IR US GUIDE VASC ACCESS LEFT  02/25/2017   IRRIGATION AND DEBRIDEMENT ABSCESS N/A 02/18/2017   Procedure: IRRIGATION AND DEBRIDEMENT ABSCESS;  Surgeon: Glenna Fellows, MD;  Location: [facility];  Service: General;  Laterality: N/A;   NEPHROLITHOTOMY Right 10/20/2021   Procedure: RIGHT NEPHROLITHOTOMY PERCUTANEOUS;  Surgeon: Bjorn Pippin, MD;  Location: [facility];  Service: Urology;  Laterality: Right;   TONSILLECTOMY     WISDOM TOOTH EXTRACTION      Current Outpatient Medications on File Prior to Visit  Medication Sig Dispense Refill   acetaminophen (TYLENOL) 325 MG tablet Take 650 mg by mouth every 4 (four) hours as needed for headache, mild pain or fever. Take 650 mg by mouth two times a day and every 8 hours as needed for general discomfort     acetaminophen-codeine  (TYLENOL #3) 300-30 MG tablet Take 1 tablet by mouth every 12 (twelve) hours as needed (for severe breakthrough pain). DO NOT EXCEED 4000 MG OF TYLENOL IN A DAY     albuterol (VENTOLIN HFA) 108 (90 Base) MCG/ACT inhaler Inhale 2 puffs into the lungs every 6 (six) hours as needed for wheezing or shortness of breath. 8 g 2   ammonium lactate (LAC-HYDRIN) 12 % lotion Apply 1 Application topically See admin instructions. Apply to bilateral feet and legs every shift     amoxicillin-clavulanate (AUGMENTIN) 875-125 MG tablet Take 1 tablet by mouth every 12 (twelve) hours. 14 tablet 0   baclofen (LIORESAL) 20 MG tablet Take 1 tablet (20 mg total) by mouth 4 (four) times daily. 112 tablet 5   diazepam (VALIUM) 5 MG tablet Take 1-2 tablets (5-10 mg total) by mouth 2 (two) times daily. Take 1 tablet (5 mg) BID and Take 2 tablets (10 mg) at bedtime (Patient taking differently: Take 5-10 mg by mouth See admin instructions. Take 5 mg by mouth once a day and 10 mg in the evening) 14 tablet 0   doxycycline (VIBRAMYCIN) 100 MG capsule Take 1 capsule (100 mg total) by mouth 2 (two) times daily. 20 capsule 0   Emollient (CERAVE PM) LOTN Apply 1 application  topically See admin instructions. Apply to the face every morning after cleansing     EPIPEN 2-PAK 0.3 MG/0.3ML SOAJ injection Inject 0.3 mg into the muscle as needed for anaphylaxis.     fluticasone (FLONASE) 50 MCG/ACT nasal spray Place 1 spray into the nose every 12 (twelve) hours as needed for allergies or rhinitis. Instill 1 spray into each nostril once a day and an additional once a day as needed for rhinitis     gabapentin (NEURONTIN) 300 MG capsule Take 3 caps ( 900 mg) Three times a day (Patient taking differently: Take 900 mg by mouth 3 (three) times daily.) 270 capsule 2   GERI-TUSSIN 100 MG/5ML liquid Take 600 mg by mouth See admin instructions. Take 600 mg by mouth four times a day from 06/06/2022 through 06/13/2022 and an additional 600 mg every 6 hours as  needed for coughing     ipratropium-albuterol (DUONEB) 0.5-2.5 (3) MG/3ML SOLN Take 3 mLs by nebulization every 4 (four) hours as needed (for wheezing).     LACTOBACILLUS PO Take 1 capsule by mouth daily.     lactulose (CHRONULAC) 10 GM/15ML solution Take 15 mLs (10 g total) by mouth 3 (three) times daily. 236 mL 0   lamoTRIgine (LAMICTAL) 100 MG tablet Take 100 mg by mouth 2 (two) times daily.     loratadine (CLARITIN) 10 MG tablet Take 10 mg by mouth daily.     Multiple Vitamins-Minerals (MULTIVITAMIN WITH MINERALS) tablet Take 1 tablet by mouth daily.     OLANZapine (ZYPREXA) 2.5 MG tablet Take 2.5 mg  by mouth at bedtime.     omeprazole (PRILOSEC) 20 MG capsule Take 20 mg by mouth in the morning.     ondansetron (ZOFRAN) 4 MG tablet Take 4 mg by mouth every 8 (eight) hours as needed for nausea or vomiting.     SANTYL 250 UNIT/GM ointment Apply 1 Application topically See admin instructions. Apply to bilateral heels (wounds) every dsay shift on Mon/Wed/Fri. Apply skin prep around peri-wound and cover with Duoderm dressing.     sertraline (ZOLOFT) 100 MG tablet Take 1 tablet (100 mg total) by mouth 2 (two) times daily. 60 tablet 0   urea (CARMOL) 20 % cream Apply 1 application  topically See admin instructions. Apply to both feet once a day     zinc sulfate 220 (50 Zn) MG capsule Take 220 mg by mouth daily.     No current facility-administered medications on file prior to visit.    Allergies  Allergen Reactions   Iodine Anaphylaxis   Latex Anaphylaxis   Other Other (See Comments)    Documented on Order Summary Report - unknown reaction Apricot, collard greens, green bean, melon, peaches, turnip greens   Polymyxin B Hives    Pt had hives in right arm with infusion of polymyxin on November 26th, 2018   Shellfish-Derived Products Anaphylaxis and Other (See Comments)    All seafood   Vancomycin Anaphylaxis   Aspirin Other (See Comments)    "Allergic," per [place]   Fish-Derived  Products Other (See Comments)    "Allergic," per [place]   Iodinated Contrast Media Hives and Other (See Comments)    Patient requires 13 hour  prep prior to administration   Strawberry Extract Hives and Rash    Social History:  reports that she has never smoked. She has never used smokeless tobacco. She reports that she does not drink alcohol and does not use drugs.  Family History  Problem Relation Age of Onset   Diabetes type II Father    CAD Father    Dementia Father    Stroke Father    Seizures Father    Diabetes type II Mother    Hypertension Mother    Breast cancer Other    Diabetes type II Other     The following portions of the patient's history were reviewed and updated as appropriate: allergies, current medications, past family history, past medical history, past social history, past surgical history and problem list.  Review of Systems Pertinent items noted in HPI and remainder of comprehensive ROS otherwise negative.  Physical Exam:  BP 111/73   Pulse 81  Physical Exam Vitals and nursing note reviewed. Exam conducted with a chaperone present.  Constitutional:      Appearance: Normal appearance.  Pulmonary:     Effort: Pulmonary effort is normal.  Abdominal:     Palpations: Abdomen is soft.  Genitourinary:    General: Normal vulva.     Comments: Examination no the patient on her side due to limited mobility Normal-appearing mons Normal-appearing right labia majora minora Mild irritation noted at the introitus Slight maceration at the perineum Appears to be atrophy of hamstring muscle with subsequent contracture of the skin including lower left labial majora towards the ischial tuberosity  Yellow vaginal discharge noted Neurological:     Mental Status: She is alert.        Assessment and Plan:   1. Vaginal discharge Vaginal swab collected. Appears to have labial displacement secondary to atrophied muscle pulling skin towards  ischial tuberosity.  No vulvar/labial wound noted. Mild irritation noted. Pap up to date.  - Cervicovaginal ancillary only    Routine preventative health maintenance measures emphasized. Please refer to After Visit Summary for other counseling recommendations.   Follow-up: No follow-ups on file.      Lorriane Shire, MD Obstetrician & Gynecologist, [facility] [employer], [facility]

## 2023-02-15 LAB — CERVICOVAGINAL ANCILLARY ONLY
Bacterial Vaginitis (gardnerella): NEGATIVE
Candida Glabrata: NEGATIVE
Candida Vaginitis: NEGATIVE
Comment: NEGATIVE
Comment: NEGATIVE
Comment: NEGATIVE

## 2023-04-29 ENCOUNTER — Telehealth: Payer: Self-pay | Admitting: Neurology

## 2023-04-29 NOTE — Telephone Encounter (Signed)
Pt called in and left a message with the access nurse on 04/27/23. She is calling about dosing questions for her medication. The charge nurse where she is at asked her to call for clarification.

## 2023-04-29 NOTE — Telephone Encounter (Signed)
Telephone call to patient, unable to connect call.

## 2023-05-03 NOTE — Telephone Encounter (Signed)
Called patient on home phone and unable to leave a message.   Called patient on cell and left a message for a call back.

## 2023-05-16 NOTE — Progress Notes (Signed)
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  Gabapentin 900mg  three times daily and baclofen 20mg  four times daily 2.  Follow up in one year.  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   UPDATE: Medications: gabapentin 900mg  three times daily; diazepam 5mg  twice daily, baclofen 20mg  four times daily, lamotrigine 50mg , sertraline100mg  twice daily    No change since last visit.  Pain is adequately controlled with gabapentin, baclofen and diazepam.  In rehab, wants to restart wheelchair pushups.     HISTORY: In 2005, she developed acute transverse myelitis.  She woke up with severe neck and back pain with weakness in the arms and legs, as well as bowel and bladder retention.  MRI of cervical spine showed abnormal signal abnormality and cord swelling from C4 through C7 with abnormal enhancement at the C4-5 level.  MRI of thoracic spine showed upper thoracic cord involvement at TI and T2.  She underwent lumbar puncture and was treated with 3 days of IV Solu-Medrol, followed by oral steroids.     She is paraplegic, unable to move below the waist.  She has both bowel and urinary retention.  She has right greater than left upper extremity weakness.   She has a urostomy pouch and requires catheterizations.  She uses stool softeners for constipation.  She takes Diazepam and Baclofen for muscle spasms, as well as gabapentin.  She has chronic sacral decubitus ulcer for which she is followed by wound care.  She reports daytime somnolence.  She sees psychiatry for depression and PTSD.   A follow up MRI of the neuroaxis with and without contrast was performed on 03/24/15, which was personally reviewed.  The brain showed slight cerebral volume loss but no demyelinating  lesions.  The cervical and thoracic spinal cord showed atrophy extending from C6 to T2, greatest at C7-T1.  There is a left central disc extrusion at C5-6 resulting in cord flattening with new T2 signal at this level but no abnormal enhancement.  PAST MEDICAL HISTORY: Past Medical History:  Diagnosis Date   Bladder stones    Hx of   Depression    GERD (gastroesophageal reflux disease)    Intertrigo 04/24/2017   Left upper extremity swelling 04/24/2017   Multiple sclerosis (HCC)    Paraplegia (HCC)    Polycystic disease, ovaries    PTSD (post-traumatic stress disorder)    Transverse myelitis (HCC)    Yeast infection 09/26/2017    MEDICATIONS: Current Outpatient Medications on File Prior to Visit  Medication Sig Dispense Refill   acetaminophen (TYLENOL) 325 MG tablet Take 650 mg by mouth every 4 (four) hours as needed for headache, mild pain or fever. Take 650 mg by mouth two times a day and every 8 hours as needed for general discomfort     acetaminophen-codeine (TYLENOL #3) 300-30 MG tablet Take 1 tablet by mouth every 12 (twelve) hours as needed (for severe breakthrough pain). DO NOT EXCEED 4000 MG OF TYLENOL IN A DAY     albuterol (VENTOLIN HFA) 108 (90 Base) MCG/ACT inhaler Inhale 2 puffs into the lungs every 6 (six) hours as needed for wheezing or shortness of breath. 8 g 2   ammonium lactate (LAC-HYDRIN) 12 % lotion Apply 1 Application topically See admin instructions. Apply to bilateral feet and legs every  shift     amoxicillin-clavulanate (AUGMENTIN) 875-125 MG tablet Take 1 tablet by mouth every 12 (twelve) hours. 14 tablet 0   baclofen (LIORESAL) 20 MG tablet Take 1 tablet (20 mg total) by mouth 4 (four) times daily. 112 tablet 5   diazepam (VALIUM) 5 MG tablet Take 1-2 tablets (5-10 mg total) by mouth 2 (two) times daily. Take 1 tablet (5 mg) BID and Take 2 tablets (10 mg) at bedtime (Patient taking differently: Take 5-10 mg by mouth See admin instructions. Take 5 mg by mouth  once a day and 10 mg in the evening) 14 tablet 0   doxycycline (VIBRAMYCIN) 100 MG capsule Take 1 capsule (100 mg total) by mouth 2 (two) times daily. 20 capsule 0   Emollient (CERAVE PM) LOTN Apply 1 application  topically See admin instructions. Apply to the face every morning after cleansing     EPIPEN 2-PAK 0.3 MG/0.3ML SOAJ injection Inject 0.3 mg into the muscle as needed for anaphylaxis.     fluticasone (FLONASE) 50 MCG/ACT nasal spray Place 1 spray into the nose every 12 (twelve) hours as needed for allergies or rhinitis. Instill 1 spray into each nostril once a day and an additional once a day as needed for rhinitis     gabapentin (NEURONTIN) 300 MG capsule Take 3 caps ( 900 mg) Three times a day (Patient taking differently: Take 900 mg by mouth 3 (three) times daily.) 270 capsule 2   GERI-TUSSIN 100 MG/5ML liquid Take 600 mg by mouth See admin instructions. Take 600 mg by mouth four times a day from 06/06/2022 through 06/13/2022 and an additional 600 mg every 6 hours as needed for coughing     ipratropium-albuterol (DUONEB) 0.5-2.5 (3) MG/3ML SOLN Take 3 mLs by nebulization every 4 (four) hours as needed (for wheezing).     LACTOBACILLUS PO Take 1 capsule by mouth daily.     lactulose (CHRONULAC) 10 GM/15ML solution Take 15 mLs (10 g total) by mouth 3 (three) times daily. 236 mL 0   lamoTRIgine (LAMICTAL) 100 MG tablet Take 100 mg by mouth 2 (two) times daily.     loratadine (CLARITIN) 10 MG tablet Take 10 mg by mouth daily.     Multiple Vitamins-Minerals (MULTIVITAMIN WITH MINERALS) tablet Take 1 tablet by mouth daily.     OLANZapine (ZYPREXA) 2.5 MG tablet Take 2.5 mg by mouth at bedtime.     omeprazole (PRILOSEC) 20 MG capsule Take 20 mg by mouth in the morning.     ondansetron (ZOFRAN) 4 MG tablet Take 4 mg by mouth every 8 (eight) hours as needed for nausea or vomiting.     SANTYL 250 UNIT/GM ointment Apply 1 Application topically See admin instructions. Apply to bilateral heels  (wounds) every dsay shift on Mon/Wed/Fri. Apply skin prep around peri-wound and cover with Duoderm dressing.     sertraline (ZOLOFT) 100 MG tablet Take 1 tablet (100 mg total) by mouth 2 (two) times daily. 60 tablet 0   urea (CARMOL) 20 % cream Apply 1 application  topically See admin instructions. Apply to both feet once a day     zinc sulfate 220 (50 Zn) MG capsule Take 220 mg by mouth daily.     No current facility-administered medications on file prior to visit.    ALLERGIES: Allergies  Allergen Reactions   Iodine Anaphylaxis   Latex Anaphylaxis   Other Other (See Comments)    Documented on Order Summary Report - unknown reaction Apricot, collard greens, green  bean, melon, peaches, turnip greens   Polymyxin B Hives    Pt had hives in right arm with infusion of polymyxin on November 26th, 2018   Shellfish-Derived Products Anaphylaxis and Other (See Comments)    All seafood   Vancomycin Anaphylaxis   Aspirin Other (See Comments)    "Allergic," per [place]   Fish-Derived Products Other (See Comments)    "Allergic," per [place]   Iodinated Contrast Media Hives and Other (See Comments)    Patient requires 13 hour  prep prior to administration   Strawberry Extract Hives and Rash    FAMILY HISTORY: Family History  Problem Relation Age of Onset   Diabetes type II Father    CAD Father    Dementia Father    Stroke Father    Seizures Father    Diabetes type II Mother    Hypertension Mother    Breast cancer Other    Diabetes type II Other       Objective:  Blood pressure 93/61, pulse 81, SpO2 96%. General: No acute distress.  Patient appears well-groomed.   Head:  Normocephalic/atraumatic Eyes:  Fundi examined but not visualized Neurological Exam: Alert and oriented.  Speech fluent and not dysarthric.  Language intact.  CN II-XII intact.  Decreased bulk with increased tone in left upper extremity and bilateral lower extremities, atrophy in both hands.  Muscle  strength 4/5 left deltoid, 1/5 left hand grip, 0/5 lower extremities. Otherwise, 5/5.  Sensation to light touch reduced in lower extremities.  Deep tendon reflexes 3+ throughout, slightly more brisk on the left.  Nonambulatory.     Shon Millet, DO

## 2023-05-20 ENCOUNTER — Encounter: Payer: Self-pay | Admitting: Neurology

## 2023-05-20 ENCOUNTER — Ambulatory Visit: Payer: Medicaid Other | Admitting: Neurology

## 2023-05-20 VITALS — BP 93/61 | HR 81

## 2023-05-20 DIAGNOSIS — G373 Acute transverse myelitis in demyelinating disease of central nervous system: Secondary | ICD-10-CM | POA: Diagnosis not present

## 2023-05-20 DIAGNOSIS — G8220 Paraplegia, unspecified: Secondary | ICD-10-CM

## 2023-05-20 NOTE — Patient Instructions (Signed)
 Continue current management

## 2023-07-02 ENCOUNTER — Encounter: Payer: Self-pay | Admitting: Neurology

## 2023-07-02 ENCOUNTER — Telehealth: Payer: Self-pay | Admitting: Neurology

## 2023-07-02 NOTE — Telephone Encounter (Signed)
 Pt called and LM with AN. said she is having some things going on that started a few days and would like to know if they are related to her other issues she has. Would like to talk to sheena about this

## 2023-07-02 NOTE — Telephone Encounter (Signed)
Pt called in and left a message. She is returning our call. 

## 2023-07-02 NOTE — Telephone Encounter (Signed)
 LMOVM for patient to give the office  a call back or send mychart message with her concerns.

## 2023-07-26 ENCOUNTER — Other Ambulatory Visit

## 2023-10-11 ENCOUNTER — Ambulatory Visit
Admission: RE | Admit: 2023-10-11 | Discharge: 2023-10-11 | Disposition: A | Discharge status: Home / Self Care | Source: Ambulatory Visit | Attending: Family Medicine

## 2023-10-11 DIAGNOSIS — Z1231 Encounter for screening mammogram for malignant neoplasm of breast: Secondary | ICD-10-CM

## 2023-10-11 DIAGNOSIS — D241 Benign neoplasm of right breast: Secondary | ICD-10-CM

## 2023-10-21 ENCOUNTER — Telehealth: Payer: Self-pay | Admitting: *Deleted

## 2023-10-21 NOTE — Telephone Encounter (Signed)
 Pt left message stating she is having hot flashes and is miserable. She requests Rx for this problem.   7/23  1510  Called pt and she did not answer. I left message on her personal voicemail stating that she will need to be seen by a provider before medication can be prescribed. She has been scheduled for 8/25 @ 9:15 am.

## 2023-10-24 ENCOUNTER — Encounter (HOSPITAL_COMMUNITY): Payer: Self-pay | Admitting: Emergency Medicine

## 2023-10-24 ENCOUNTER — Other Ambulatory Visit: Payer: Self-pay

## 2023-10-24 ENCOUNTER — Emergency Department (HOSPITAL_COMMUNITY)

## 2023-10-24 ENCOUNTER — Emergency Department (HOSPITAL_COMMUNITY)
Admission: EM | Admit: 2023-10-24 | Discharge: 2023-10-24 | Disposition: A | Discharge status: Home / Self Care | Attending: Emergency Medicine | Admitting: Emergency Medicine

## 2023-10-24 DIAGNOSIS — Y92129 Unspecified place in nursing home as the place of occurrence of the external cause: Secondary | ICD-10-CM | POA: Diagnosis not present

## 2023-10-24 DIAGNOSIS — S8011XA Contusion of right lower leg, initial encounter: Secondary | ICD-10-CM | POA: Insufficient documentation

## 2023-10-24 DIAGNOSIS — W240XXA Contact with lifting devices, not elsewhere classified, initial encounter: Secondary | ICD-10-CM | POA: Insufficient documentation

## 2023-10-24 DIAGNOSIS — S8991XA Unspecified injury of right lower leg, initial encounter: Secondary | ICD-10-CM | POA: Diagnosis present

## 2023-10-24 DIAGNOSIS — Z91040 Latex allergy status: Secondary | ICD-10-CM | POA: Diagnosis not present

## 2023-10-24 NOTE — ED Triage Notes (Signed)
 Patient BIB [facility] c/o right foot/ankle pain after being sat on her foot while facility was transferring her with the hoyer lift.  Patient has had an xray already with no abnormalities found, paperwork is with patient.  Patient wants to be further assessed due to continued pain.    110/60 HR 70 97% RA GCS 15

## 2023-10-24 NOTE — ED Notes (Signed)
 Patient returned from X-ray

## 2023-10-24 NOTE — ED Notes (Signed)
PTAR CALLED  °

## 2023-10-24 NOTE — ED Provider Notes (Signed)
 [place] EMERGENCY DEPARTMENT AT [facility] Provider Note   CSN: [id number] Arrival date & time: 10/24/23  9748     Patient presents with: Ankle Pain   [name] is a 43 y.o. female.   Patient presents to the emergency department with concerns over right ankle injury.  Patient reports that she was injured at her nursing home when transferring with a [facility] lift.  She reports that an x-ray was performed at the nursing home but she does not trust the x-ray machine.  She would like a second opinion.       Prior to Admission medications   Medication Sig Start Date End Date Taking? Authorizing Provider  acetaminophen  (TYLENOL ) 325 MG tablet Take 650 mg by mouth every 4 (four) hours as needed for headache, mild pain or fever. Take 650 mg by mouth two times a day and every 8 hours as needed for general discomfort    [provider]  acetaminophen -codeine  (TYLENOL  #3) 300-30 MG tablet Take 1 tablet by mouth every 12 (twelve) hours as needed (for severe breakthrough pain). DO NOT EXCEED 4000 MG OF TYLENOL  IN A DAY    [provider]  Albuterol  Sulfate (PROAIR  RESPICLICK) 108 (90 Base) MCG/ACT AEPB Inhale into the lungs.    [provider]  ammonium lactate (LAC-HYDRIN) 12 % lotion Apply 1 Application topically See admin instructions. Apply to bilateral feet and legs every shift    [provider]  amoxicillin -clavulanate (AUGMENTIN ) 875-125 MG tablet Take 1 tablet by mouth every 12 (twelve) hours. 02/13/23   Logan Ubaldo NOVAK, PA-C  baclofen  (LIORESAL ) 20 MG tablet Take 1 tablet (20 mg total) by mouth 4 (four) times daily. 02/13/19   Skeet, Adam R, DO  diazepam  (VALIUM ) 5 MG tablet Take 1-2 tablets (5-10 mg total) by mouth 2 (two) times daily. Take 1 tablet (5 mg) BID and Take 2 tablets (10 mg) at bedtime Patient taking differently: Take 5-10 mg by mouth See admin instructions. Take 5 mg by mouth once a day and 10 mg in the evening  03/12/18   Cheryle Page, MD  doxycycline  (VIBRAMYCIN ) 100 MG capsule Take 1 capsule (100 mg total) by mouth 2 (two) times daily. 02/13/23   Logan Ubaldo NOVAK, PA-C  Emollient (CERAVE PM) LOTN Apply 1 application  topically See admin instructions. Apply to the face every morning after cleansing    [provider]  EPIPEN  2-PAK 0.3 MG/0.3ML SOAJ injection Inject 0.3 mg into the muscle as needed for anaphylaxis.    [provider]  fluticasone  (FLONASE ) 50 MCG/ACT nasal spray Place 1 spray into the nose every 12 (twelve) hours as needed for allergies or rhinitis. Instill 1 spray into each nostril once a day and an additional once a day as needed for rhinitis    [provider]  gabapentin  (NEURONTIN ) 300 MG capsule Take 3 caps ( 900 mg) Three times a day Patient taking differently: Take 300 mg by mouth 3 (three) times daily. Per Facility 1 tab TID 01/30/22   Skeet Juliene SAUNDERS, DO  LACTOBACILLUS PO Take 1 capsule by mouth daily.    [provider]  lactulose  (CHRONULAC ) 10 GM/15ML solution Take 15 mLs (10 g total) by mouth 3 (three) times daily. 06/13/22   Jillian Buttery, MD  lamoTRIgine  (LAMICTAL ) 100 MG tablet Take 100 mg by mouth 2 (two) times daily.    [provider]  loratadine  (CLARITIN ) 10 MG tablet Take 10 mg by mouth daily.  [provider]  Multiple Vitamins-Minerals (MULTIVITAMIN WITH MINERALS) tablet Take 1 tablet by mouth daily.    [provider]  OLANZapine  (ZYPREXA ) 2.5 MG tablet Take 2.5 mg by mouth at bedtime.    [provider]  omeprazole (PRILOSEC) 20 MG capsule Take 20 mg by mouth in the morning.    [provider]  ondansetron  (ZOFRAN ) 4 MG tablet Take 4 mg by mouth every 8 (eight) hours as needed for nausea or vomiting.    [provider]  SANTYL 250 UNIT/GM ointment Apply 1 Application topically See admin instructions. Apply to bilateral heels (wounds) every dsay shift on Mon/Wed/Fri. Apply  skin prep around peri-wound and cover with Duoderm dressing.    [provider]  sertraline  (ZOLOFT ) 100 MG tablet Take 1 tablet (100 mg total) by mouth 2 (two) times daily. 04/03/17   Krishnan, Gokul, MD  urea (CARMOL) 20 % cream Apply 1 application  topically See admin instructions. Apply to both feet once a day    [provider]  zinc  sulfate 220 (50 Zn) MG capsule Take 220 mg by mouth daily.    [provider]    Allergies: Iodine, Latex, Other, Polymyxin b , Shellfish-derived products, Vancomycin, Aspirin, Fish-derived products, Iodinated contrast media, and Strawberry extract    Review of Systems  Updated Vital Signs BP (!) 102/54   Pulse 67   Temp 98 F (36.7 C)   Resp 18   Wt 77 kg   SpO2 97%   BMI 30.07 kg/m   Physical Exam Vitals and nursing note reviewed.  Constitutional:      Appearance: Normal appearance.  Cardiovascular:     Pulses: Normal pulses.  Musculoskeletal:     Right ankle: Swelling present. No deformity, ecchymosis or lacerations. Tenderness present. Decreased range of motion.     Right foot: Swelling present. No deformity.  Skin:    General: Skin is cool.     Findings: No bruising.  Neurological:     Mental Status: She is alert.     (all labs ordered are listed, but only abnormal results are displayed) Labs Reviewed - No data to display  EKG: None  Radiology: DG Foot Complete Right Result Date: 10/24/2023 EXAM: 3 VIEW(S) XRAY OF THE RIGHT FOOT 10/24/2023 03:40:43 AM COMPARISON: None available. CLINICAL HISTORY: Injury. BIB [facility] c/o right foot/ankle pain after being sat on her foot while facility was transferring her with the hoyer lift. FINDINGS: BONES AND JOINTS: Osteopenia. Mild degenerative changes of the first MTP (metatarsophalangeal) joint. No acute fracture. No focal osseous lesion. No joint dislocation. SOFT TISSUES: The soft tissues are unremarkable. IMPRESSION: 1. No acute fracture or dislocation. Electronically  signed by: Pinkie Pebbles MD 10/24/2023 03:45 AM EDT RP Workstation: [id number]   DG Ankle Complete Right Result Date: 10/24/2023 EXAM: 3 or more VIEW(S) XRAY OF THE RIGHT ANKLE 10/24/2023 03:40:43 AM CLINICAL HISTORY: Injury. BIB [facility] c/o right foot/ankle pain after being sat on her foot while facility was transferring her with the hoyer lift. COMPARISON: None available. FINDINGS: BONES AND JOINTS: Osteopenia. Tibiotalar degenerative changes. No acute fracture. No focal osseous lesion. No joint dislocation. SOFT TISSUES: Mild lateral soft tissue swelling. IMPRESSION: 1. No acute osseous abnormality. 2. Mild lateral soft tissue swelling. Electronically signed by: Pinkie Pebbles MD 10/24/2023 03:45 AM EDT RP Workstation: [id number]     Procedures   Medications Ordered in the ED - No data to display  Medical Decision Making Amount and/or Complexity of Data Reviewed Radiology: ordered and independent interpretation performed. Decision-making details documented in ED Course.   Presents to the emergency department for evaluation of foot and ankle pain.  Patient had an injury at skilled nursing facility earlier.  X-rays are negative.     Final diagnoses:  Contusion of right lower extremity, initial encounter    ED Discharge Orders     None          Peggyann Zwiefelhofer, Lonni PARAS, MD 10/24/23 [phone]

## 2023-11-25 ENCOUNTER — Ambulatory Visit (INDEPENDENT_AMBULATORY_CARE_PROVIDER_SITE_OTHER): Admitting: Family Medicine

## 2023-11-25 ENCOUNTER — Encounter: Payer: Self-pay | Admitting: Family Medicine

## 2023-11-25 VITALS — BP 122/76 | HR 79

## 2023-11-25 DIAGNOSIS — R232 Flushing: Secondary | ICD-10-CM

## 2023-11-25 NOTE — Progress Notes (Signed)
   Subjective:    Patient ID: [name] is a 43 y.o. female presenting with Hot Flashes  on 11/25/2023  HPI: Reports hot flashes and sweating x many years. Her roommate is being frozen in the room We have previously looked at her [facility] and TSH and they were normal. She was having normal cycles.  She reports some irregularity, but not skipping them. She has a PMH notable for several meds that might be contributing to her symptoms. Her mom has h/o premature menopause and h/o DVT. She has h/o DVT as well. H/o liver abnormalities minor with sepsis admit in 06/2022  Review of Systems  Constitutional:  Negative for chills and fever.  Respiratory:  Negative for shortness of breath.   Cardiovascular:  Negative for chest pain.  Gastrointestinal:  Negative for abdominal pain, nausea and vomiting.  Genitourinary:  Negative for dysuria.  Skin:  Negative for rash.      Objective:    BP 122/76   Pulse 79  Physical Exam Exam conducted with a chaperone present.  Constitutional:      General: She is not in acute distress.    Appearance: She is well-developed.  HENT:     Head: Normocephalic and atraumatic.  Eyes:     General: No scleral icterus. Cardiovascular:     Rate and Rhythm: Normal rate.  Pulmonary:     Effort: Pulmonary effort is normal.  Abdominal:     Palpations: Abdomen is soft.  Musculoskeletal:     Cervical back: Neck supple.  Skin:    General: Skin is warm and dry.  Neurological:     Mental Status: She is alert and oriented to person, place, and time.         Assessment & Plan:   Problem List Items Addressed This Visit       Unprioritized   Hot flashes - Primary   ? [place] Check TSH, FSH, CMP Consider veozah, liver function testing needed.]not a candidate for COC's or HRT due to h/o DVT and FH of DVT.      Relevant Orders   Follicle stimulating hormone   TSH   LH   CMP14+EGFR      No follow-ups on file.  Glenys GORMAN Birk,  MD 11/25/2023 9:33 AM

## 2023-11-25 NOTE — Assessment & Plan Note (Signed)
?   Meonpause Check TSH, FSH, CMP Consider veozah, liver function testing needed.]not a candidate for COC's or HRT due to h/o DVT and FH of DVT.

## 2023-11-26 LAB — CMP14+EGFR
ALT: 14 IU/L (ref 0–32)
AST: 17 IU/L (ref 0–40)
Albumin: 4.7 g/dL (ref 3.9–4.9)
Alkaline Phosphatase: 91 IU/L (ref 44–121)
BUN/Creatinine Ratio: 25 — ABNORMAL HIGH (ref 9–23)
BUN: 11 mg/dL (ref 6–24)
Bilirubin Total: 0.4 mg/dL (ref 0.0–1.2)
CO2: 21 mmol/L (ref 20–29)
Calcium: 9.8 mg/dL (ref 8.7–10.2)
Chloride: 100 mmol/L (ref 96–106)
Creatinine, Ser: 0.44 mg/dL — ABNORMAL LOW (ref 0.57–1.00)
Globulin, Total: 2 g/dL (ref 1.5–4.5)
Glucose: 102 mg/dL — ABNORMAL HIGH (ref 70–99)
Potassium: 4.2 mmol/L (ref 3.5–5.2)
Sodium: 140 mmol/L (ref 134–144)
Total Protein: 6.7 g/dL (ref 6.0–8.5)
eGFR: 124 mL/min/1.73 (ref >59)

## 2023-11-26 LAB — LUTEINIZING HORMONE: LH: 14.7 m[IU]/mL

## 2023-11-26 LAB — FOLLICLE STIMULATING HORMONE: FSH: 8.3 m[IU]/mL

## 2023-11-26 LAB — TSH: TSH: 0.819 u[IU]/mL (ref 0.450–4.500)

## 2023-11-27 ENCOUNTER — Ambulatory Visit: Payer: Self-pay | Admitting: Family Medicine

## 2023-11-28 ENCOUNTER — Encounter: Payer: Self-pay | Admitting: Neurology

## 2023-12-04 ENCOUNTER — Encounter: Payer: Self-pay | Admitting: Family Medicine

## 2023-12-09 ENCOUNTER — Ambulatory Visit: Admitting: Family Medicine

## 2024-01-11 ENCOUNTER — Emergency Department (HOSPITAL_COMMUNITY)

## 2024-01-11 ENCOUNTER — Inpatient Hospital Stay (HOSPITAL_COMMUNITY)
Admission: EM | Admit: 2024-01-11 | Discharge: 2024-01-13 | DRG: 872 | Disposition: A | Discharge status: Skilled Nursing Facility | Source: Skilled Nursing Facility | Attending: Family Medicine | Admitting: Family Medicine

## 2024-01-11 ENCOUNTER — Other Ambulatory Visit: Payer: Self-pay

## 2024-01-11 DIAGNOSIS — Z7401 Bed confinement status: Secondary | ICD-10-CM

## 2024-01-11 DIAGNOSIS — Z803 Family history of malignant neoplasm of breast: Secondary | ICD-10-CM | POA: Diagnosis not present

## 2024-01-11 DIAGNOSIS — Z1152 Encounter for screening for COVID-19: Secondary | ICD-10-CM | POA: Diagnosis not present

## 2024-01-11 DIAGNOSIS — E872 Acidosis, unspecified: Secondary | ICD-10-CM | POA: Diagnosis present

## 2024-01-11 DIAGNOSIS — Z87892 Personal history of anaphylaxis: Secondary | ICD-10-CM | POA: Diagnosis not present

## 2024-01-11 DIAGNOSIS — Z79899 Other long term (current) drug therapy: Secondary | ICD-10-CM | POA: Diagnosis not present

## 2024-01-11 DIAGNOSIS — Z886 Allergy status to analgesic agent status: Secondary | ICD-10-CM

## 2024-01-11 DIAGNOSIS — Z91040 Latex allergy status: Secondary | ICD-10-CM

## 2024-01-11 DIAGNOSIS — Z833 Family history of diabetes mellitus: Secondary | ICD-10-CM

## 2024-01-11 DIAGNOSIS — Z881 Allergy status to other antibiotic agents status: Secondary | ICD-10-CM

## 2024-01-11 DIAGNOSIS — A419 Sepsis, unspecified organism: Principal | ICD-10-CM | POA: Diagnosis present

## 2024-01-11 DIAGNOSIS — G373 Acute transverse myelitis in demyelinating disease of central nervous system: Secondary | ICD-10-CM | POA: Diagnosis present

## 2024-01-11 DIAGNOSIS — G35D Multiple sclerosis, unspecified: Secondary | ICD-10-CM | POA: Diagnosis present

## 2024-01-11 DIAGNOSIS — B961 Klebsiella pneumoniae [K. pneumoniae] as the cause of diseases classified elsewhere: Secondary | ICD-10-CM | POA: Diagnosis present

## 2024-01-11 DIAGNOSIS — Z883 Allergy status to other anti-infective agents status: Secondary | ICD-10-CM | POA: Diagnosis not present

## 2024-01-11 DIAGNOSIS — Z8249 Family history of ischemic heart disease and other diseases of the circulatory system: Secondary | ICD-10-CM | POA: Diagnosis not present

## 2024-01-11 DIAGNOSIS — Z87440 Personal history of urinary (tract) infections: Secondary | ICD-10-CM | POA: Diagnosis not present

## 2024-01-11 DIAGNOSIS — R6520 Severe sepsis without septic shock: Secondary | ICD-10-CM | POA: Diagnosis present

## 2024-01-11 DIAGNOSIS — Z936 Other artificial openings of urinary tract status: Secondary | ICD-10-CM

## 2024-01-11 DIAGNOSIS — Z91013 Allergy to seafood: Secondary | ICD-10-CM

## 2024-01-11 DIAGNOSIS — N390 Urinary tract infection, site not specified: Secondary | ICD-10-CM | POA: Diagnosis present

## 2024-01-11 DIAGNOSIS — G8220 Paraplegia, unspecified: Secondary | ICD-10-CM | POA: Diagnosis present

## 2024-01-11 DIAGNOSIS — R739 Hyperglycemia, unspecified: Secondary | ICD-10-CM

## 2024-01-11 DIAGNOSIS — F4310 Post-traumatic stress disorder, unspecified: Secondary | ICD-10-CM | POA: Diagnosis present

## 2024-01-11 DIAGNOSIS — Z91041 Radiographic dye allergy status: Secondary | ICD-10-CM

## 2024-01-11 DIAGNOSIS — A4189 Other specified sepsis: Principal | ICD-10-CM | POA: Diagnosis present

## 2024-01-11 DIAGNOSIS — G09 Sequelae of inflammatory diseases of central nervous system: Secondary | ICD-10-CM | POA: Diagnosis present

## 2024-01-11 DIAGNOSIS — K5909 Other constipation: Secondary | ICD-10-CM | POA: Diagnosis present

## 2024-01-11 DIAGNOSIS — F32A Depression, unspecified: Secondary | ICD-10-CM | POA: Diagnosis present

## 2024-01-11 DIAGNOSIS — Z823 Family history of stroke: Secondary | ICD-10-CM

## 2024-01-11 DIAGNOSIS — K219 Gastro-esophageal reflux disease without esophagitis: Secondary | ICD-10-CM | POA: Diagnosis present

## 2024-01-11 DIAGNOSIS — Z91018 Allergy to other foods: Secondary | ICD-10-CM

## 2024-01-11 LAB — HCG, SERUM, QUALITATIVE: Preg, Serum: NEGATIVE

## 2024-01-11 LAB — CBC WITH DIFFERENTIAL/PLATELET
Abs Immature Granulocytes: 0.05 K/uL (ref 0.00–0.07)
Basophils Absolute: 0 K/uL (ref 0.0–0.1)
Basophils Relative: 0 %
Eosinophils Absolute: 0.1 K/uL (ref 0.0–0.5)
Eosinophils Relative: 0 %
HCT: 42.8 % (ref 36.0–46.0)
Hemoglobin: 13.8 g/dL (ref 12.0–15.0)
Immature Granulocytes: 0 %
Lymphocytes Relative: 4 %
Lymphs Abs: 0.5 K/uL — ABNORMAL LOW (ref 0.7–4.0)
MCH: 27.7 pg (ref 26.0–34.0)
MCHC: 32.2 g/dL (ref 30.0–36.0)
MCV: 85.8 fL (ref 80.0–100.0)
Monocytes Absolute: 0.4 K/uL (ref 0.1–1.0)
Monocytes Relative: 3 %
Neutro Abs: 12.5 K/uL — ABNORMAL HIGH (ref 1.7–7.7)
Neutrophils Relative %: 93 %
Platelets: 203 K/uL (ref 150–400)
RBC: 4.99 MIL/uL (ref 3.87–5.11)
RDW: 15.7 % — ABNORMAL HIGH (ref 11.5–15.5)
WBC: 13.5 K/uL — ABNORMAL HIGH (ref 4.0–10.5)
nRBC: 0 % (ref 0.0–0.2)

## 2024-01-11 LAB — URINALYSIS, W/ REFLEX TO CULTURE (INFECTION SUSPECTED)
Bilirubin Urine: NEGATIVE
Glucose, UA: NEGATIVE mg/dL
Ketones, ur: NEGATIVE mg/dL
Nitrite: NEGATIVE
Protein, ur: NEGATIVE mg/dL
Specific Gravity, Urine: 1.018 (ref 1.005–1.030)
pH: 5 (ref 5.0–8.0)

## 2024-01-11 LAB — COMPREHENSIVE METABOLIC PANEL WITH GFR
ALT: 23 U/L (ref 0–44)
AST: 29 U/L (ref 15–41)
Albumin: 4.7 g/dL (ref 3.5–5.0)
Alkaline Phosphatase: 92 U/L (ref 38–126)
Anion gap: 15 (ref 5–15)
BUN: 10 mg/dL (ref 6–20)
CO2: 23 mmol/L (ref 22–32)
Calcium: 9.7 mg/dL (ref 8.9–10.3)
Chloride: 101 mmol/L (ref 98–111)
Creatinine, Ser: 0.45 mg/dL (ref 0.44–1.00)
GFR, Estimated: >60 mL/min (ref >60)
Glucose, Bld: 110 mg/dL — ABNORMAL HIGH (ref 70–99)
Potassium: 4.5 mmol/L (ref 3.5–5.1)
Sodium: 139 mmol/L (ref 135–145)
Total Bilirubin: 0.6 mg/dL (ref 0.0–1.2)
Total Protein: 6.7 g/dL (ref 6.5–8.1)

## 2024-01-11 LAB — I-STAT CG4 LACTIC ACID, ED
Lactic Acid, Venous: 3 mmol/L (ref 0.5–1.9)
Lactic Acid, Venous: 2.8 mmol/L (ref 0.5–1.9)

## 2024-01-11 LAB — GROUP A STREP BY PCR: Group A Strep by PCR: NOT DETECTED

## 2024-01-11 LAB — RESP PANEL BY RT-PCR (RSV, FLU A&B, COVID)  RVPGX2
Influenza A by PCR: NEGATIVE
Influenza B by PCR: NEGATIVE
Resp Syncytial Virus by PCR: NEGATIVE
SARS Coronavirus 2 by RT PCR: NEGATIVE

## 2024-01-11 LAB — PROTIME-INR
INR: 1 (ref 0.8–1.2)
Prothrombin Time: 13.8 s (ref 11.4–15.2)

## 2024-01-11 LAB — LACTIC ACID, PLASMA
Lactic Acid, Venous: 1.6 mmol/L (ref 0.5–1.9)
Lactic Acid, Venous: 3.1 mmol/L (ref 0.5–1.9)
Lactic Acid, Venous: 0.9 mmol/L (ref 0.5–1.9)

## 2024-01-11 MED ORDER — OXYCODONE HCL 5 MG PO TABS
5.0000 mg | ORAL_TABLET | ORAL | Status: DC | PRN
  Administered 2024-01-11: 5 mg via ORAL
  Filled 2024-01-11: qty 1

## 2024-01-11 MED ORDER — LACTATED RINGERS IV BOLUS (SEPSIS)
1000.0000 mL | Freq: Once | INTRAVENOUS | Status: AC
  Administered 2024-01-11: 1000 mL via INTRAVENOUS

## 2024-01-11 MED ORDER — METRONIDAZOLE 500 MG/100ML IV SOLN
500.0000 mg | Freq: Once | INTRAVENOUS | Status: AC
  Administered 2024-01-11: 500 mg via INTRAVENOUS
  Filled 2024-01-11: qty 100

## 2024-01-11 MED ORDER — GUAIFENESIN-DM 100-10 MG/5ML PO SYRP
5.0000 mL | ORAL_SOLUTION | ORAL | Status: DC | PRN
  Administered 2024-01-11 – 2024-01-13 (×4): 5 mL via ORAL
  Filled 2024-01-11 (×5): qty 10

## 2024-01-11 MED ORDER — SODIUM CHLORIDE 0.9 % IV SOLN
2.0000 g | Freq: Three times a day (TID) | INTRAVENOUS | Status: DC
  Administered 2024-01-11 – 2024-01-13 (×6): 2 g via INTRAVENOUS
  Filled 2024-01-11 (×7): qty 12.5

## 2024-01-11 MED ORDER — TRAZODONE HCL 50 MG PO TABS
25.0000 mg | ORAL_TABLET | Freq: Every evening | ORAL | Status: DC | PRN
  Administered 2024-01-11: 25 mg via ORAL
  Filled 2024-01-11: qty 1

## 2024-01-11 MED ORDER — ALBUTEROL SULFATE (2.5 MG/3ML) 0.083% IN NEBU: 2.5000 mg | INHALATION_SOLUTION | RESPIRATORY_TRACT | Status: DC | PRN

## 2024-01-11 MED ORDER — PANTOPRAZOLE SODIUM 40 MG PO TBEC
40.0000 mg | DELAYED_RELEASE_TABLET | Freq: Every day | ORAL | Status: DC
  Administered 2024-01-12 – 2024-01-13 (×2): 40 mg via ORAL
  Filled 2024-01-11 (×2): qty 1

## 2024-01-11 MED ORDER — SALINE SPRAY 0.65 % NA SOLN
1.0000 | NASAL | Status: DC | PRN
  Administered 2024-01-11: 1 via NASAL
  Filled 2024-01-11 (×2): qty 44

## 2024-01-11 MED ORDER — ONDANSETRON HCL 4 MG PO TABS: 4.0000 mg | ORAL_TABLET | Freq: Four times a day (QID) | ORAL | Status: DC | PRN

## 2024-01-11 MED ORDER — ACETAMINOPHEN 650 MG RE SUPP: 650.0000 mg | Freq: Four times a day (QID) | RECTAL | Status: DC | PRN

## 2024-01-11 MED ORDER — ENOXAPARIN SODIUM 40 MG/0.4ML IJ SOSY
40.0000 mg | PREFILLED_SYRINGE | INTRAMUSCULAR | Status: DC
  Administered 2024-01-12 – 2024-01-13 (×2): 40 mg via SUBCUTANEOUS
  Filled 2024-01-11 (×2): qty 0.4

## 2024-01-11 MED ORDER — ONDANSETRON HCL 4 MG/2ML IJ SOLN: 4.0000 mg | Freq: Four times a day (QID) | INTRAMUSCULAR | Status: DC | PRN

## 2024-01-11 MED ORDER — ACETAMINOPHEN 325 MG PO TABS
650.0000 mg | ORAL_TABLET | Freq: Four times a day (QID) | ORAL | Status: DC | PRN
  Administered 2024-01-11 – 2024-01-13 (×4): 650 mg via ORAL
  Filled 2024-01-11 (×4): qty 2

## 2024-01-11 MED ORDER — LACTATED RINGERS IV SOLN
INTRAVENOUS | Status: AC
Start: 2024-01-11 — End: 2024-01-12

## 2024-01-11 MED ORDER — LACTATED RINGERS IV SOLN: INTRAVENOUS | Status: DC

## 2024-01-11 MED ORDER — LACTATED RINGERS IV BOLUS (SEPSIS)
250.0000 mL | Freq: Once | INTRAVENOUS | Status: AC
  Administered 2024-01-11: 250 mL via INTRAVENOUS

## 2024-01-11 MED ORDER — ACETAMINOPHEN 325 MG PO TABS
650.0000 mg | ORAL_TABLET | Freq: Once | ORAL | Status: AC
  Administered 2024-01-11: 650 mg via ORAL
  Filled 2024-01-11: qty 2

## 2024-01-11 MED ORDER — SODIUM CHLORIDE 0.9 % IV SOLN
2.0000 g | Freq: Once | INTRAVENOUS | Status: AC
  Administered 2024-01-11: 2 g via INTRAVENOUS
  Filled 2024-01-11: qty 12.5

## 2024-01-11 NOTE — ED Notes (Signed)
 Pt stated she felt like she had a bowel movement. On going to change pt, a formed piece of stool was noted. No diarrhea or skin breakdown noted. Pt brief changed, she was repositioned in the bed, side rails up x2. Pt comfortable, resting, and without complaint. Will continue to monitor.

## 2024-01-11 NOTE — ED Provider Notes (Signed)
 [place] EMERGENCY DEPARTMENT AT [facility] Provider Note   CSN: [id number] Arrival date & time: 01/11/24  0451     Patient presents with: Code Sepsis   [name] is a 43 y.o. female.   The history is provided by the patient.   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  She started getting sick yesterday with a sore throat and then developed a harsh nonproductive cough.  She did not have a documented fever but has had chills and sweats.  She denies nausea, vomiting, diarrhea.  Urostomy drainage has been clear.  She states that a month ago there were people in the facility she resides in who had COVID, but none recently.    Prior to Admission medications   Medication Sig Start Date End Date Taking? Authorizing Provider  acetaminophen  (TYLENOL ) 325 MG tablet Take 650 mg by mouth every 4 (four) hours as needed for headache, mild pain or fever. Take 650 mg by mouth two times a day and every 8 hours as needed for general discomfort    [provider]  acetaminophen -codeine  (TYLENOL  #3) 300-30 MG tablet Take 1 tablet by mouth every 12 (twelve) hours as needed (for severe breakthrough pain). DO NOT EXCEED 4000 MG OF TYLENOL  IN A DAY    [provider]  Albuterol  Sulfate (PROAIR  RESPICLICK) 108 (90 Base) MCG/ACT AEPB Inhale into the lungs.    [provider]  ammonium lactate (LAC-HYDRIN) 12 % lotion Apply 1 Application topically See admin instructions. Apply to bilateral feet and legs every shift    [provider]  baclofen  (LIORESAL ) 20 MG tablet Take 1 tablet (20 mg total) by mouth 4 (four) times daily. 02/13/19   Skeet Juliene SAUNDERS, DO  diazepam  (VALIUM ) 5 MG tablet Take 1-2 tablets (5-10 mg total) by mouth 2 (two) times daily. Take 1 tablet (5 mg) BID and Take 2 tablets (10 mg) at bedtime Patient taking  differently: Take 5-10 mg by mouth See admin instructions. Take 5 mg by mouth once a day and 10 mg in the evening 03/12/18   Cheryle Page, MD  Emollient (CERAVE PM) LOTN Apply 1 application  topically See admin instructions. Apply to the face every morning after cleansing    [provider]  EPIPEN  2-PAK 0.3 MG/0.3ML SOAJ injection Inject 0.3 mg into the muscle as needed for anaphylaxis.    [provider]  fluticasone  (FLONASE ) 50 MCG/ACT nasal spray Place 1 spray into the nose every 12 (twelve) hours as needed for allergies or rhinitis. Instill 1 spray into each nostril once a day and an additional once a day as needed for rhinitis    [provider]  gabapentin  (NEURONTIN ) 300 MG capsule Take 3 caps ( 900 mg) Three times a day 01/30/22   Skeet Juliene R, DO  LACTOBACILLUS PO Take 1 capsule by mouth daily.    [provider]  lactulose  (CHRONULAC ) 10 GM/15ML solution Take 15 mLs (10 g total) by mouth 3 (three) times daily. 06/13/22   Jillian Buttery, MD  lamoTRIgine  (LAMICTAL ) 100 MG tablet Take 100 mg by mouth 2 (two) times daily.    [provider]  loratadine  (CLARITIN ) 10 MG tablet Take 10 mg by mouth daily.    [provider]  Multiple Vitamins-Minerals (MULTIVITAMIN WITH MINERALS) tablet Take 1 tablet by mouth daily.    [provider]  OLANZapine  (ZYPREXA ) 2.5 MG tablet Take 2.5 mg by mouth at bedtime.    [provider]  omeprazole (PRILOSEC) 20 MG capsule Take 20 mg by mouth in the morning.    [provider]  ondansetron  (ZOFRAN ) 4 MG tablet Take 4 mg by mouth every 8 (eight) hours as needed for nausea or vomiting.    [provider]  SANTYL 250 UNIT/GM ointment Apply 1 Application topically See admin instructions. Apply to bilateral heels (wounds) every dsay shift on Mon/Wed/Fri. Apply skin prep around peri-wound and cover with Duoderm dressing.    [provider]  sertraline  (ZOLOFT ) 100 MG  tablet Take 1 tablet (100 mg total) by mouth 2 (two) times daily. 04/03/17   Krishnan, Gokul, MD  urea (CARMOL) 20 % cream Apply 1 application  topically See admin instructions. Apply to both feet once a day    [provider]  zinc  sulfate 220 (50 Zn) MG capsule Take 220 mg by mouth daily.    [provider]    Allergies: Iodine, Latex, Other, Polymyxin b , Shellfish protein-containing drug products, Vancomycin, Aspirin, Fish protein-containing drug products, Iodinated contrast media, and Strawberry extract    Review of Systems  All other systems reviewed and are negative.   Updated Vital Signs BP 120/66 (BP Location: Right Arm)   Pulse (!) 105   Temp (!) 101.8 F (38.8 C) (Oral)   Resp (!) 24   Ht 5' 3.5 (1.613 m)   Wt 72.7 kg   SpO2 93%   BMI 27.93 kg/m   Physical Exam Vitals and nursing note reviewed.   43 year old female, resting comfortably and in no acute distress. Vital signs are significant for elevated temperature, heart rate, respiratory rate. Oxygen saturation is 93%, which is normal.  He displayed a fever, she is nontoxic in appearance. Head is normocephalic and atraumatic. PERRLA, EOMI. Oropharynx is mildly erythematous without exudate. Neck is nontender and supple without adenopathy. Back is nontender and there is no CVA tenderness. Lungs are clear without rales, wheezes, or rhonchi. Chest is nontender. Heart has regular rate and rhythm without murmur. Abdomen is soft, flat, nontender.  Ileal conduit present in the right lower quadrant. Extremities have no cyanosis or edema. Skin is warm and dry without rash.  No decubiti seen. Neurologic: Awake and alert, paraplegia present.  (all labs ordered are listed, but only abnormal results are displayed) Labs Reviewed  CULTURE, BLOOD (ROUTINE X 2)  CULTURE, BLOOD (ROUTINE X 2)  GROUP A STREP BY PCR  RESP PANEL BY RT-PCR (RSV, FLU A&B, COVID)  RVPGX2  COMPREHENSIVE METABOLIC PANEL WITH GFR  CBC WITH  DIFFERENTIAL/PLATELET  PROTIME-INR  HCG, SERUM, QUALITATIVE  URINALYSIS, W/ REFLEX TO CULTURE (INFECTION SUSPECTED)  I-STAT CG4 LACTIC ACID, ED    EKG: EKG Interpretation Date/Time:  Saturday January 11 2024 06:27:31 EDT Ventricular Rate:  102 PR Interval:  137 QRS Duration:  79 QT Interval:  323 QTC Calculation: 421 R Axis:   34  Text Interpretation: Sinus tachycardia Low voltage, precordial leads When compared with ECG of 06/06/2022, No significant change was found Confirmed by Raford Lenis ([id number]) on 01/11/2024 7:08:28 AM  Radiology: No results found.   Procedures  Cardiac monitor shows normal sinus rhythm, per my interpretation. Medications Ordered in the ED  acetaminophen  (TYLENOL ) tablet 650 mg (has no administration in time range)  lactated ringers  infusion (has no administration in time range)  Medical Decision Making Amount and/or Complexity of Data Reviewed Labs: ordered. Radiology: ordered.  Risk OTC drugs. Prescription drug management. Decision regarding hospitalization.   Fever concerning for sepsis.  This is a presentation with a wide range of treatment options and carries with it a high risk of morbidity and complications.  Differential diagnosis includes, but is not limited to, urinary tract infection, pneumonia, tonsillitis/pharyngitis, sinusitis, pneumonia, viral infection such as COVID-19 or RSV or influenza.  Because of fever and tachycardia and tachypnea, I have started the patient on the evolving sepsis pathway.  I have ordered acetaminophen  for fever.  I have reviewed her electrocardiogram, my interpretation is sinus tachycardia and low voltage, not significantly changed from prior.  I reviewed her laboratory tests, and my interpretation is leukocytosis with left shift concerning for infection, normal hemoglobin, elevated lactic acid level concerning for sepsis, negative PCR for group A strep.  Other labs are still  in process.  Chest x-ray still pending.  I have initiated antibiotics for sepsis of undetermined origin.  She will need to be admitted.  Chest x-ray shows no acute cardiopulmonary process.  I have independently viewed the image, and agree with radiologist's interpretation.  Respiratory pathogen panel is negative for COVID-19 and influenza and RSV.  Urinalysis does have some pyuria with 21-50 WBCs and rare bacteria, but this is expected for urine from an ileal conduit, no evidence of UTI.  I have discussed the case with Dr. Roxane of [facility], who agrees to admit the patient.  CRITICAL CARE Performed by: Alm Lias Total critical care time: 55 minutes Critical care time was exclusive of separately billable procedures and treating other patients. Critical care was necessary to treat or prevent imminent or life-threatening deterioration. Critical care was time spent personally by me on the following activities: development of treatment plan with patient and/or surrogate as well as nursing, discussions with consultants, evaluation of patient's response to treatment, examination of patient, obtaining history from patient or surrogate, ordering and performing treatments and interventions, ordering and review of laboratory studies, ordering and review of radiographic studies, pulse oximetry and re-evaluation of patient's condition.     Final diagnoses:  Sepsis due to undetermined organism (HCC)  Elevated random blood glucose level    ED Discharge Orders     None          Lias Alm, MD 01/11/24 [id number]

## 2024-01-11 NOTE — ED Triage Notes (Signed)
 Pt BIB EMS coming from [facility] c/o of sore throat, and flu like symptoms that started yesterday. Also c/o CP and fever. Pt is bed bound.   EMS. RR 30, CBG 127, 22g Left hand 100-200 LR given en route.

## 2024-01-11 NOTE — H&P (Signed)
 History and Physical  [name] FMW:[id number] DOB: 07/08/1980 DOA: 01/11/2024  PCP: Pcp, No   Chief Complaint: Fever, chills  HPI: [name] is a 43 y.o. 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  History is provided by the patient, who is a resident of a subacute nursing facility.  She states that starting 2 days ago she had a bit of a cough and was feeling generally unwell, yesterday her nurses noticed that her urine seemed darker than usual.  Early morning hours today, she started having teeth chattering, shaking rigors, and fever.  No other localizing symptoms.  She was brought to the emergency department, where workup as detailed below shows evidence of severe sepsis.  Review of Systems: Please see HPI for pertinent positives and negatives. A complete 10 system review of systems are otherwise negative.  Past Medical History:  Diagnosis Date   Bladder stones    Hx of   Depression    GERD (gastroesophageal reflux disease)    Intertrigo 04/24/2017   Left upper extremity swelling 04/24/2017   Multiple sclerosis    Paraplegia (HCC)    Polycystic disease, ovaries    PTSD (post-traumatic stress disorder)    Transverse myelitis (HCC)    Yeast infection 09/26/2017   Past Surgical History:  Procedure Laterality Date   BREAST BIOPSY Right 05/22/2022   US  RT BREAST BX W LOC DEV 1ST LESION IMG BX SPEC US  GUIDE 05/22/2022 [facility]MO[facility]   CHOLECYSTECTOMY     CYSTECTOMY W/ URETEROILEAL CONDUIT     IR FLUORO GUIDE CV LINE LEFT  02/25/2017   IR NEPHROURETERAL CATH PLACE RIGHT  10/20/2021   IR US  GUIDE VASC ACCESS LEFT  02/25/2017   IRRIGATION AND DEBRIDEMENT ABSCESS N/A 02/18/2017   Procedure: IRRIGATION AND DEBRIDEMENT ABSCESS;  Surgeon: Mikell Katz, MD;   Location: [facility];  Service: General;  Laterality: N/A;   NEPHROLITHOTOMY Right 10/20/2021   Procedure: RIGHT NEPHROLITHOTOMY PERCUTANEOUS;  Surgeon: Watt Rush, MD;  Location: [facility];  Service: Urology;  Laterality: Right;   TONSILLECTOMY     WISDOM TOOTH EXTRACTION     Social History:  reports that she has never smoked. She has never used smokeless tobacco. She reports that she does not drink alcohol and does not use drugs.  Allergies  Allergen Reactions   Iodine Anaphylaxis   Latex Anaphylaxis   Other Other (See Comments)    Documented on Order Summary Report - unknown reaction Apricot, collard greens, green bean, melon, peaches, turnip greens   Polymyxin B  Hives    Pt had hives in right arm with infusion of polymyxin on November 26th, 2018   Shellfish Protein-Containing Drug Products Anaphylaxis and Other (See Comments)    All seafood   Vancomycin Anaphylaxis   Aspirin Other (See Comments)    Allergic, per [place]   Fish Protein-Containing Drug Products Other (See Comments)    Allergic, per [place]   Iodinated Contrast Media Hives and Other (See Comments)    Patient requires 13 hour  prep prior to administration   Strawberry Extract Hives and Rash    Family History  Problem Relation Age of Onset   Diabetes type II Father    CAD Father    Dementia Father    Stroke Father    Seizures Father    Diabetes type II Mother  Hypertension Mother    Breast cancer Other    Diabetes type II Other      Prior to Admission medications   Medication Sig Start Date End Date Taking? Authorizing Provider  acetaminophen  (TYLENOL ) 325 MG tablet Take 650 mg by mouth every 4 (four) hours as needed for headache, mild pain or fever. Take 650 mg by mouth two times a day and every 8 hours as needed for general discomfort    [provider]  acetaminophen -codeine  (TYLENOL  #3) 300-30 MG tablet Take 1 tablet by mouth every 12 (twelve) hours as needed (for severe breakthrough  pain). DO NOT EXCEED 4000 MG OF TYLENOL  IN A DAY    [provider]  Albuterol  Sulfate (PROAIR  RESPICLICK) 108 (90 Base) MCG/ACT AEPB Inhale into the lungs.    [provider]  ammonium lactate (LAC-HYDRIN) 12 % lotion Apply 1 Application topically See admin instructions. Apply to bilateral feet and legs every shift    [provider]  baclofen  (LIORESAL ) 20 MG tablet Take 1 tablet (20 mg total) by mouth 4 (four) times daily. 02/13/19   Skeet Juliene SAUNDERS, DO  diazepam  (VALIUM ) 5 MG tablet Take 1-2 tablets (5-10 mg total) by mouth 2 (two) times daily. Take 1 tablet (5 mg) BID and Take 2 tablets (10 mg) at bedtime Patient taking differently: Take 5-10 mg by mouth See admin instructions. Take 5 mg by mouth once a day and 10 mg in the evening 03/12/18   Cheryle Page, MD  Emollient (CERAVE PM) LOTN Apply 1 application  topically See admin instructions. Apply to the face every morning after cleansing    [provider]  EPIPEN  2-PAK 0.3 MG/0.3ML SOAJ injection Inject 0.3 mg into the muscle as needed for anaphylaxis.    [provider]  fluticasone  (FLONASE ) 50 MCG/ACT nasal spray Place 1 spray into the nose every 12 (twelve) hours as needed for allergies or rhinitis. Instill 1 spray into each nostril once a day and an additional once a day as needed for rhinitis    [provider]  gabapentin  (NEURONTIN ) 300 MG capsule Take 3 caps ( 900 mg) Three times a day 01/30/22   Skeet Juliene R, DO  LACTOBACILLUS PO Take 1 capsule by mouth daily.    [provider]  lactulose  (CHRONULAC ) 10 GM/15ML solution Take 15 mLs (10 g total) by mouth 3 (three) times daily. 06/13/22   Jillian Buttery, MD  lamoTRIgine  (LAMICTAL ) 100 MG tablet Take 100 mg by mouth 2 (two) times daily.    [provider]  loratadine  (CLARITIN ) 10 MG tablet Take 10 mg by mouth daily.    [provider]  Multiple Vitamins-Minerals (MULTIVITAMIN WITH MINERALS) tablet Take 1  tablet by mouth daily.    [provider]  OLANZapine  (ZYPREXA ) 2.5 MG tablet Take 2.5 mg by mouth at bedtime.    [provider]  omeprazole (PRILOSEC) 20 MG capsule Take 20 mg by mouth in the morning.    [provider]  ondansetron  (ZOFRAN ) 4 MG tablet Take 4 mg by mouth every 8 (eight) hours as needed for nausea or vomiting.    [provider]  SANTYL 250 UNIT/GM ointment Apply 1 Application topically See admin instructions. Apply to bilateral heels (wounds) every dsay shift on Mon/Wed/Fri. Apply skin prep around peri-wound and cover with Duoderm dressing.    [provider]  sertraline  (ZOLOFT ) 100 MG tablet Take 1 tablet (100 mg total) by mouth 2 (two) times daily. 04/03/17  Krishnan, Gokul, MD  urea (CARMOL) 20 % cream Apply 1 application  topically See admin instructions. Apply to both feet once a day    [provider]  zinc  sulfate 220 (50 Zn) MG capsule Take 220 mg by mouth daily.    [provider]    Physical Exam: BP 125/71 (BP Location: Right Arm)   Pulse (!) 103   Temp (!) 100.4 F (38 C) (Oral)   Resp (!) 21   Ht 5' 3.5 (1.613 m)   Wt 72.7 kg   SpO2 96%   BMI 27.93 kg/m  General:  Alert, oriented, calm, in no acute distress, looks comfortable and nontoxic Cardiovascular: RRR, no murmurs or rubs, no peripheral edema  Respiratory: clear to auscultation bilaterally, no wheezes, no crackles  Abdomen: soft, nontender, nondistended, ileostomy in place, normal bowel tones heard  Skin: dry, no rashes, per ER provider patient has no pressure wounds Musculoskeletal: no joint effusions, normal range of motion  Psychiatric: appropriate affect, normal speech  Neurologic: extraocular muscles intact, clear speech, paraplegic         Labs on Admission:  Basic Metabolic Panel: Recent Labs  Lab 01/11/24 0617  NA 139  K 4.5  CL 101  CO2 23  GLUCOSE 110*  BUN 10  CREATININE 0.45  CALCIUM  9.7   Liver Function  Tests: Recent Labs  Lab 01/11/24 0617  AST 29  ALT 23  ALKPHOS 92  BILITOT 0.6  PROT 6.7  ALBUMIN  4.7   No results for input(s): LIPASE, AMYLASE in the last 168 hours. No results for input(s): AMMONIA in the last 168 hours. CBC: Recent Labs  Lab 01/11/24 0617  WBC 13.5*  NEUTROABS 12.5*  HGB 13.8  HCT 42.8  MCV 85.8  PLT 203   Cardiac Enzymes: No results for input(s): CKTOTAL, CKMB, CKMBINDEX, TROPONINI in the last 168 hours. BNP (last 3 results) No results for input(s): BNP in the last 8760 hours.  ProBNP (last 3 results) No results for input(s): PROBNP in the last 8760 hours.  CBG: No results for input(s): GLUCAP in the last 168 hours.  Radiological Exams on Admission: DG Chest Port 1 View Result Date: 01/11/2024 EXAM: 1 VIEW(S) XRAY OF THE CHEST 01/11/2024 07:10:00 AM COMPARISON: 02/12/2023 CLINICAL HISTORY: Questionable sepsis - evaluate for abnormality. Pt BIB EMS coming from [facility] c/o of sore throat, and flu like symptoms that started yesterday. Also c/o CP and fever. Pt is bed bound. FINDINGS: LINES, TUBES AND DEVICES: EKG leads noted. LUNGS AND PLEURA: No focal pulmonary opacity. No pulmonary edema. No pleural effusion. No pneumothorax. HEART AND MEDIASTINUM: No acute abnormality of the cardiac and mediastinal silhouettes. BONES AND SOFT TISSUES: No acute osseous abnormality. IMPRESSION: 1. No acute cardiopulmonary process detected. Electronically signed by: Waddell Calk MD 01/11/2024 07:20 AM EDT RP Workstation: [id number]   Assessment/Plan [name] is a 43 y.o. 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   Severe sepsis-meeting criteria with fever, tachycardia, tachypnea, leukocytosis.  Initial lactate 3.0.  Suspected source is  complicated UTI in this patient with prior complicated UTIs associated with ileal conduit. -Inpatient admission -Monitor closely on progressive unit -Continue empiric IV cefepime  -Follow-up blood cultures and urine culture -Continue LR infusion, and trend lactic acid  Paraplegia-baclofen   Chronic constipation-Home bowel regimen will be resumed  GERD-omeprazole  Depression/PTSD-plan to continue  Lamictal , Zyprexa  once medications are confirmed  DVT prophylaxis: Lovenox      Code Status: Full Code  Consults called: None  Admission status: The appropriate patient status for this patient is INPATIENT. Inpatient status is judged to be reasonable and necessary in order to provide the required intensity of service to ensure the patient's safety. The patient's presenting symptoms, physical exam findings, and initial radiographic and laboratory data in the context of their chronic comorbidities is felt to place them at high risk for further clinical deterioration. Furthermore, it is not anticipated that the patient will be medically stable for discharge from the hospital within 2 midnights of admission.    I certify that at the point of admission it is my clinical judgment that the patient will require inpatient hospital care spanning beyond 2 midnights from the point of admission due to high intensity of service, high risk for further deterioration and high frequency of surveillance required  Time spent: 65 minutes  Jalyric Kaestner CHRISTELLA Gail MD [facility] Pager [phone]  If 7PM-7AM, please contact night-coverage [email or web address] Password [facility]  01/11/2024, 9:54 AM

## 2024-01-11 NOTE — Sepsis Progress Note (Signed)
 Elink following code sepsis

## 2024-01-12 ENCOUNTER — Encounter (HOSPITAL_COMMUNITY): Payer: Self-pay | Admitting: Internal Medicine

## 2024-01-12 DIAGNOSIS — R6520 Severe sepsis without septic shock: Secondary | ICD-10-CM | POA: Diagnosis not present

## 2024-01-12 DIAGNOSIS — A419 Sepsis, unspecified organism: Secondary | ICD-10-CM | POA: Diagnosis not present

## 2024-01-12 LAB — CBC
HCT: 36.6 % (ref 36.0–46.0)
Hemoglobin: 11.7 g/dL — ABNORMAL LOW (ref 12.0–15.0)
MCH: 27.9 pg (ref 26.0–34.0)
MCHC: 32 g/dL (ref 30.0–36.0)
MCV: 87.1 fL (ref 80.0–100.0)
Platelets: 161 K/uL (ref 150–400)
RBC: 4.2 MIL/uL (ref 3.87–5.11)
RDW: 15.9 % — ABNORMAL HIGH (ref 11.5–15.5)
WBC: 7.2 K/uL (ref 4.0–10.5)
nRBC: 0 % (ref 0.0–0.2)

## 2024-01-12 LAB — BASIC METABOLIC PANEL WITH GFR
Anion gap: 10 (ref 5–15)
BUN: 7 mg/dL (ref 6–20)
CO2: 23 mmol/L (ref 22–32)
Calcium: 8.8 mg/dL — ABNORMAL LOW (ref 8.9–10.3)
Chloride: 103 mmol/L (ref 98–111)
Creatinine, Ser: 0.32 mg/dL — ABNORMAL LOW (ref 0.44–1.00)
GFR, Estimated: >60 mL/min (ref >60)
Glucose, Bld: 91 mg/dL (ref 70–99)
Potassium: 3.5 mmol/L (ref 3.5–5.1)
Sodium: 136 mmol/L (ref 135–145)

## 2024-01-12 LAB — HIV ANTIBODY (ROUTINE TESTING W REFLEX): HIV Screen 4th Generation wRfx: NONREACTIVE

## 2024-01-12 LAB — LACTIC ACID, PLASMA: Lactic Acid, Venous: 0.8 mmol/L (ref 0.5–1.9)

## 2024-01-12 LAB — MRSA NEXT GEN BY PCR, NASAL: MRSA by PCR Next Gen: NOT DETECTED

## 2024-01-12 MED ORDER — CERAVE PM EX LOTN: 1.0000 | TOPICAL_LOTION | CUTANEOUS | Status: DC

## 2024-01-12 MED ORDER — POLYVINYL ALCOHOL 1.4 % OP SOLN: 1.0000 [drp] | Freq: Two times a day (BID) | OPHTHALMIC | Status: DC | PRN

## 2024-01-12 MED ORDER — LAMOTRIGINE 100 MG PO TABS
100.0000 mg | ORAL_TABLET | Freq: Two times a day (BID) | ORAL | Status: DC
  Administered 2024-01-12 – 2024-01-13 (×3): 100 mg via ORAL
  Filled 2024-01-12 (×3): qty 1

## 2024-01-12 MED ORDER — GABAPENTIN 300 MG PO CAPS
300.0000 mg | ORAL_CAPSULE | Freq: Three times a day (TID) | ORAL | Status: DC
  Administered 2024-01-12 – 2024-01-13 (×5): 300 mg via ORAL
  Filled 2024-01-12 (×5): qty 1

## 2024-01-12 MED ORDER — VITAMIN C 500 MG PO TABS
500.0000 mg | ORAL_TABLET | Freq: Every morning | ORAL | Status: DC
  Administered 2024-01-12 – 2024-01-13 (×2): 500 mg via ORAL
  Filled 2024-01-12 (×2): qty 1

## 2024-01-12 MED ORDER — BACLOFEN 20 MG PO TABS
20.0000 mg | ORAL_TABLET | Freq: Four times a day (QID) | ORAL | Status: DC
  Administered 2024-01-12 – 2024-01-13 (×6): 20 mg via ORAL
  Filled 2024-01-12 (×6): qty 1

## 2024-01-12 MED ORDER — LORATADINE 10 MG PO TABS
10.0000 mg | ORAL_TABLET | ORAL | Status: DC | PRN
Start: 2024-01-12 — End: 2024-01-14

## 2024-01-12 MED ORDER — DIAZEPAM 5 MG PO TABS: 5.0000 mg | ORAL_TABLET | Freq: Two times a day (BID) | ORAL | Status: DC

## 2024-01-12 MED ORDER — ARTIFICIAL TEARS OP SOLN: 1.0000 [drp] | Freq: Two times a day (BID) | OPHTHALMIC | Status: DC

## 2024-01-12 MED ORDER — FEZOLINETANT 45 MG PO TABS: 1.0000 | ORAL_TABLET | Freq: Every day | ORAL | Status: DC

## 2024-01-12 MED ORDER — MAGNESIUM HYDROXIDE 400 MG/5ML PO SUSP
30.0000 mL | ORAL | Status: DC | PRN
Start: 2024-01-12 — End: 2024-01-14

## 2024-01-12 MED ORDER — DIAZEPAM 5 MG PO TABS
5.0000 mg | ORAL_TABLET | Freq: Every morning | ORAL | Status: DC
  Administered 2024-01-12 – 2024-01-13 (×2): 5 mg via ORAL
  Filled 2024-01-12 (×2): qty 1

## 2024-01-12 MED ORDER — DIAZEPAM 5 MG PO TABS
10.0000 mg | ORAL_TABLET | Freq: Every day | ORAL | Status: DC
  Administered 2024-01-12: 10 mg via ORAL
  Filled 2024-01-12: qty 2

## 2024-01-12 NOTE — Progress Notes (Signed)
 PROGRESS NOTE    [name]  FMW:[id number] DOB: Oct 18, 1980 DOA: 01/11/2024 PCP: Pcp, No   Brief Narrative:  HPI: [name] is a 43 y.o. 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  History is provided by the patient, who is a resident of a subacute nursing facility.  She states that starting 2 days ago she had a bit of a cough and was feeling generally unwell, yesterday her nurses noticed that her urine seemed darker than usual.  Early morning hours today, she started having teeth chattering, shaking rigors, and fever.  No other localizing symptoms.  She was brought to the emergency department, where workup as detailed below shows evidence of severe sepsis.   Assessment & Plan:   Active Problems:   Severe sepsis (HCC)  Severe sepsis secondary to complicated UTI for patient with history of ileal conduit and recurrent UTIs.  Met severe sepsis criteria based on fever, tachycardia, tachypnea, leukocytosis and lactic acid of 3.  Lactic acidosis, as well as leukocytosis improved, overall sepsis physiology has improved.  Continue cefepime , follow culture and tailor antibiotics.  Last temperature spike 100.7 at 3 PM 01/11/2024.  Will need to be afebrile for 24 hours prior to discharge.  Paraplegia-baclofen    Chronic constipation-Home bowel regimen will be resumed   GERD-omeprazole   Depression/PTSD-resume all home medications.  DVT prophylaxis: enoxaparin  (LOVENOX ) injection 40 mg Start: 01/11/24 1200   Code Status: Full Code  Family Communication:  None present at bedside.  Plan of care discussed with patient in length and he/she verbalized understanding and agreed with it.  Status is: Inpatient Remains inpatient appropriate because: Resolving sepsis, awaiting culture  reports.   Estimated body mass index is 29.29 kg/m as calculated from the following:   Height as of this encounter: 5' 3 (1.6 m).   Weight as of this encounter: 75 kg.    Nutritional Assessment: Body mass index is 29.29 kg/m.SABRA Seen by dietician.  I agree with the assessment and plan as outlined below: Nutrition Status:        . Skin Assessment: I have examined the patient's skin and I agree with the wound assessment as performed by the wound care RN as outlined below:    Consultants:  None  Procedures:  None  Antimicrobials:  Anti-infectives (From admission, onward)    Start     Dose/Rate Route Frequency Ordered Stop   01/11/24 1400  ceFEPIme  (MAXIPIME ) 2 g in sodium chloride  0.9 % 100 mL IVPB        2 g 200 mL/hr over 30 Minutes Intravenous Every 8 hours 01/11/24 1002     01/11/24 0645  ceFEPIme  (MAXIPIME ) 2 g in sodium chloride  0.9 % 100 mL IVPB        2 g 200 mL/hr over 30 Minutes Intravenous  Once 01/11/24 0641 01/11/24 0745   01/11/24 0645  metroNIDAZOLE  (FLAGYL ) IVPB 500 mg        500 mg 100 mL/hr over 60 Minutes Intravenous  Once 01/11/24 0641 01/11/24 0905         Subjective: Patient seen and examined, she is fully alert and oriented.  She has no complaints at all.  Objective: Vitals:   01/11/24 1750 01/11/24 2304 01/12/24 0424 01/12/24 0653  BP: 110/63 (!) 116/57 (!) 100/53 (!) 94/55  Pulse: (!) 105 93 87 82  Resp: 20 18 18 18   Temp: 99.5 F (37.5 C) 99.5 F (37.5 C) 99.2 F (37.3 C) 98.1 F (36.7 C)  TempSrc: Oral  Oral   SpO2: 95% 97% 96% 95%  Weight:      Height:        Intake/Output Summary (Last 24 hours) at 01/12/2024 0753 Last data filed at 01/12/2024 0656 Gross per 24 hour  Intake 932.5 ml  Output 2075 ml  Net -1142.5 ml   Filed Weights   01/11/24 0503 01/11/24 1520  Weight: 72.7 kg 75 kg    Examination:  General exam: Appears calm and comfortable  Respiratory system: Clear to auscultation. Respiratory effort  normal. Cardiovascular system: S1 & S2 heard, RRR. No JVD, murmurs, rubs, gallops or clicks. No pedal edema. Gastrointestinal system: Abdomen is nondistended, soft and nontender. No organomegaly or masses felt. Normal bowel sounds heard.  Ileal conduit and bag with clear urine. Central nervous system: Alert and oriented.  Paraplegia, weakness in the left hand. Extremities: Symmetric 5 x 5 power. Skin: No rashes, lesions or ulcers Psychiatry: Judgement and insight appear normal. Mood & affect appropriate.    Data Reviewed: I have personally reviewed following labs and imaging studies  CBC: Recent Labs  Lab 01/11/24 0617 01/12/24 0240  WBC 13.5* 7.2  NEUTROABS 12.5*  --   HGB 13.8 11.7*  HCT 42.8 36.6  MCV 85.8 87.1  PLT 203 161   Basic Metabolic Panel: Recent Labs  Lab 01/11/24 0617 01/12/24 0240  NA 139 136  K 4.5 3.5  CL 101 103  CO2 23 23  GLUCOSE 110* 91  BUN 10 7  CREATININE 0.45 0.32*  CALCIUM  9.7 8.8*   GFR: Estimated Creatinine Clearance: 88.8 mL/min (A) (by C-G formula based on SCr of 0.32 mg/dL (L)). Liver Function Tests: Recent Labs  Lab 01/11/24 0617  AST 29  ALT 23  ALKPHOS 92  BILITOT 0.6  PROT 6.7  ALBUMIN  4.7   No results for input(s): LIPASE, AMYLASE in the last 168 hours. No results for input(s): AMMONIA in the last 168 hours. Coagulation Profile: Recent Labs  Lab 01/11/24 0623  INR 1.0   Cardiac Enzymes: No results for input(s): CKTOTAL, CKMB, CKMBINDEX, TROPONINI in the last 168 hours. BNP (last 3 results) No results for input(s): PROBNP in the last 8760 hours. HbA1C: No results for input(s): HGBA1C in the last 72 hours. CBG: No results for input(s): GLUCAP in the last 168 hours. Lipid Profile: No results for input(s): CHOL, HDL, LDLCALC, TRIG, CHOLHDL, LDLDIRECT in the last 72 hours. Thyroid Function Tests: No results for input(s): TSH, T4TOTAL, FREET4, T3FREE, THYROIDAB in the last 72  hours. Anemia Panel: No results for input(s): VITAMINB12, FOLATE, FERRITIN, TIBC, IRON, RETICCTPCT in the last 72 hours. Sepsis Labs: Recent Labs  Lab 01/11/24 1545 01/11/24 1843 01/11/24 2313 01/12/24 0226  LATICACIDVEN 1.6 3.1* 0.9 0.8    Recent Results (from the past 240 hours)  Group A Strep by PCR     Status: None   Collection Time: 01/11/24  6:30 AM   Specimen: Throat; Sterile Swab  Result Value Ref Range Status   Group A Strep by PCR NOT DETECTED NOT DETECTED Final    Comment: Performed at [facility], [address] [place], [place] [address]  Resp panel by RT-PCR (RSV, Flu A&B, Covid) Throat     Status: None   Collection Time: 01/11/24  6:30 AM   Specimen: Throat; Nasal Swab  Result Value Ref Range  Status   SARS Coronavirus 2 by RT PCR NEGATIVE NEGATIVE Final    Comment: (NOTE) SARS-CoV-2 target nucleic acids are NOT DETECTED.  The SARS-CoV-2 RNA is generally detectable in upper respiratory specimens during the acute phase of infection. The lowest concentration of SARS-CoV-2 viral copies this assay can detect is 138 copies/mL. A negative result does not preclude SARS-Cov-2 infection and should not be used as the sole basis for treatment or other patient management decisions. A negative result may occur with  improper specimen collection/handling, submission of specimen other than nasopharyngeal swab, presence of viral mutation(s) within the areas targeted by this assay, and inadequate number of viral copies(<138 copies/mL). A negative result must be combined with clinical observations, patient history, and epidemiological information. The expected result is Negative.  Fact Sheet for Patients:  BloggerCourse.com  Fact Sheet for Healthcare Providers:  SeriousBroker.it  This test is no t yet approved or cleared by the [place]  FDA and  has been authorized for detection and/or  diagnosis of SARS-CoV-2 by FDA under an Emergency Use Authorization (EUA). This EUA will remain  in effect (meaning this test can be used) for the duration of the COVID-19 declaration under Section 564(b)(1) of the Act, 21 U.S.C.section 360bbb-3(b)(1), unless the authorization is terminated  or revoked sooner.       Influenza A by PCR NEGATIVE NEGATIVE Final   Influenza B by PCR NEGATIVE NEGATIVE Final    Comment: (NOTE) The Xpert Xpress SARS-CoV-2/FLU/RSV plus assay is intended as an aid in the diagnosis of influenza from Nasopharyngeal swab specimens and should not be used as a sole basis for treatment. Nasal washings and aspirates are unacceptable for Xpert Xpress SARS-CoV-2/FLU/RSV testing.  Fact Sheet for Patients: BloggerCourse.com  Fact Sheet for Healthcare Providers: SeriousBroker.it  This test is not yet approved or cleared by the [place]  FDA and has been authorized for detection and/or diagnosis of SARS-CoV-2 by FDA under an Emergency Use Authorization (EUA). This EUA will remain in effect (meaning this test can be used) for the duration of the COVID-19 declaration under Section 564(b)(1) of the Act, 21 U.S.C. section 360bbb-3(b)(1), unless the authorization is terminated or revoked.     Resp Syncytial Virus by PCR NEGATIVE NEGATIVE Final    Comment: (NOTE) Fact Sheet for Patients: BloggerCourse.com  Fact Sheet for Healthcare Providers: SeriousBroker.it  This test is not yet approved or cleared by the [place]  FDA and has been authorized for detection and/or diagnosis of SARS-CoV-2 by FDA under an Emergency Use Authorization (EUA). This EUA will remain in effect (meaning this test can be used) for the duration of the COVID-19 declaration under Section 564(b)(1) of the Act, 21 U.S.C. section 360bbb-3(b)(1), unless the authorization is terminated  or revoked.  Performed at [facility], [address], [place], [place] [address]   Blood Culture (routine x 2)     Status: None (Preliminary result)   Collection Time: 01/11/24  3:45 PM   Specimen: BLOOD RIGHT HAND  Result Value Ref Range Status   Specimen Description   Final    BLOOD RIGHT HAND Performed at [facility], [address], [place], [place] [address]    Special Requests   Final    BOTTLES DRAWN AEROBIC ONLY Blood Culture results may not be optimal due to an inadequate volume of blood received in culture bottles Performed at [facility], [address] [place], [place] [address]    Culture PENDING  Incomplete   Report  Status PENDING  Incomplete     Radiology Studies: DG Chest Port 1 View Result Date: 01/11/2024 EXAM: 1 VIEW(S) XRAY OF THE CHEST 01/11/2024 07:10:00 AM COMPARISON: 02/12/2023 CLINICAL HISTORY: Questionable sepsis - evaluate for abnormality. Pt BIB EMS coming from [facility] c/o of sore throat, and flu like symptoms that started yesterday. Also c/o CP and fever. Pt is bed bound. FINDINGS: LINES, TUBES AND DEVICES: EKG leads noted. LUNGS AND PLEURA: No focal pulmonary opacity. No pulmonary edema. No pleural effusion. No pneumothorax. HEART AND MEDIASTINUM: No acute abnormality of the cardiac and mediastinal silhouettes. BONES AND SOFT TISSUES: No acute osseous abnormality. IMPRESSION: 1. No acute cardiopulmonary process detected. Electronically signed by: Waddell Calk MD 01/11/2024 07:20 AM EDT RP Workstation: [id number]    Scheduled Meds:  enoxaparin  (LOVENOX ) injection  40 mg Subcutaneous Q24H   pantoprazole   40 mg Oral Daily   Continuous Infusions:  ceFEPime  (MAXIPIME ) IV 2 g (01/12/24 0515)     LOS: 1 day   Fredia Skeeter, MD [facility]  01/12/2024, 7:53 AM   *Please note that this is a verbal dictation therefore any spelling or grammatical errors are due to the Dragon Medical One system  interpretation.  Please page via Amion and do not message via secure chat for urgent patient care matters. Secure chat can be used for non urgent patient care matters.  How to contact the [facility] Attending or Consulting provider 7A - 7P or covering provider during after hours 7P -7A, for this patient?  Check the care team in [facility] and look for a) attending/consulting [facility] provider listed and b) the [facility] team listed. Page or secure chat 7A-7P. Log into [email or web address] and use [facility]'s universal password to access. If you do not have the password, please contact the hospital operator. Locate the [facility] provider you are looking for under [facility] and page to a number that you can be directly reached. If you still have difficulty reaching the provider, please page the [facility] (Director on Call) for the Hospitalists listed on amion for assistance.

## 2024-01-12 NOTE — Evaluation (Signed)
 Physical Therapy Evaluation Patient Details Name: [name] MRN: [id number] DOB: 10-31-1980 Today's Date: 01/12/2024  History of Present Illness  43 y.o. 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 All education has been completed and the patient has no further questions.  Pt from [facility], she is typically able to roll, R/L with use of bed features, feeds herself propels her own w/c after being lifted OOB via hoyer. Requires mod assist to roll today d/t differences in bed rail/hand position.  pt in bil prevalon boots, adjusted for improved foot position--incr df  and rotation pads moved to avoid excessive hip internal rotation. Pillows placed and positioned L side for offloading and pressure relief. Pt appreciative. No further needs in acute setting at this time. Recommend OOB via maximove with nursing staff.  Pt may benefit from PT Eval on return to SNF/LTC setting.     See below for any follow-up Physical Therapy or equipment needs. PT is signing off. Thank you for this referral.         If plan is discharge home, recommend the following: Two people to help with walking and/or transfers;Two people to help with bathing/dressing/bathroom   Can travel by private vehicle   No    Equipment Recommendations None recommended by PT  Recommendations for Other Services       Functional Status Assessment Patient has not had a recent decline in their functional status     Precautions / Restrictions Precautions Precautions: Fall      Mobility  Bed Mobility Overal bed mobility: Needs Assistance Bed Mobility: Rolling Rolling: Mod assist, Used rails          General bed mobility comments: pt requires incr assist to roll d/t rails/hand hold different than her bed at SNF    Transfers                   General transfer comment: NT-uses hoyer lift    Ambulation/Gait               General Gait Details: non amb at baseline  Stairs            Wheelchair Mobility     Tilt Bed    Modified Rankin (Stroke Patients Only)       Balance                                             Pertinent Vitals/Pain Pain Assessment Pain Assessment: No/denies pain    Home Living Family/patient expects to be discharged to:: Skilled nursing facility                   Additional Comments: has been at [facility] for 6 yrs. states her mother is at [facility].    Prior Function Prior Level of Function : Needs assist       Physical Assist : Mobility (physical) Mobility (physical): Bed mobility;Transfers   Mobility Comments: pt is able to roll R and L with ~ min assist to supervision depending on LE spasms/tone; hoyer lift, self propels her manual w/c ADLs Comments: independent/mod I with  feeding, brushing teeth, dependent LB dressing/bathing     Extremity/Trunk Assessment        Lower Extremity Assessment Lower Extremity Assessment: RLE deficits/detail RLE Deficits / Details: bil LE extensor tone, knees hips extended and feet plantar flexed, imposed movement/PROM ilicits incr tone.       Communication   Communication Communication: No apparent difficulties    Cognition Arousal: Alert Behavior During Therapy: WFL for tasks assessed/performed   PT - Cognitive impairments: No apparent impairments                         Following commands: Intact       Cueing Cueing Techniques: Verbal cues     General Comments General comments (skin integrity, edema, etc.): pt in bil prevalon boots, adjusted for improved foot position--df as able and rotation pads moved to avoid excessive hip  internal rotation    Exercises     Assessment/Plan    PT Assessment Patient does not need any further PT services  PT Problem List         PT Treatment Interventions      PT Goals (Current goals can be found in the Care Plan section)  Acute Rehab PT Goals PT Goal Formulation: All assessment and education complete, DC therapy    Frequency       Co-evaluation               AM-PAC PT 6 Clicks Mobility  Outcome Measure Help needed turning from your back to your side while in a flat bed without using bedrails?: A Lot Help needed moving from lying on your back to sitting on the side of a flat bed without using bedrails?: Total Help needed moving to and from a bed to a chair (including a wheelchair)?: Total Help needed standing up from a chair using your arms (e.g., wheelchair or bedside chair)?: Total Help needed to walk in hospital room?: Total Help needed climbing 3-5 steps with a railing? : Total 6 Click Score: 7    End of Session   Activity Tolerance: Patient tolerated treatment well Patient left: in bed;with call bell/phone within reach;with bed alarm set Nurse Communication: Mobility status PT Visit Diagnosis: Other abnormalities of gait and mobility (R26.89)    Time: 8480-8463 PT Time Calculation (min) (ACUTE ONLY): 17 min   Charges:   PT Evaluation $PT Eval Low Complexity: 1 Low   PT General Charges $$ ACUTE PT VISIT: 1 Visit         Krimson Massmann, PT  Acute Rehab Dept [facility]) [phone]  01/12/2024   [facility] 01/12/2024, 4:35 PM

## 2024-01-13 DIAGNOSIS — R6520 Severe sepsis without septic shock: Secondary | ICD-10-CM | POA: Diagnosis not present

## 2024-01-13 DIAGNOSIS — N390 Urinary tract infection, site not specified: Secondary | ICD-10-CM | POA: Diagnosis not present

## 2024-01-13 DIAGNOSIS — A419 Sepsis, unspecified organism: Secondary | ICD-10-CM | POA: Diagnosis not present

## 2024-01-13 LAB — URINE CULTURE: Culture: 60000 — AB

## 2024-01-13 MED ORDER — DIAZEPAM 5 MG PO TABS: 5.0000 mg | ORAL_TABLET | Freq: Two times a day (BID) | ORAL | 0 refills | Status: AC

## 2024-01-13 MED ORDER — SULFAMETHOXAZOLE-TRIMETHOPRIM 800-160 MG PO TABS: 1.0000 | ORAL_TABLET | Freq: Two times a day (BID) | ORAL | 0 refills | Status: AC
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
## 2024-01-13 NOTE — Evaluation (Addendum)
 Occupational Therapy Evaluation Patient Details Name: [name] MRN: [id number] DOB: [birth year] Today's Date: 01/13/2024   History of Present Illness   Patient is a 43 year old female who presented to the ED with c/o fever and chills.  Dx with severe sepsis secondary to complicated UTI and is receiving IV Abx.  PMHx includes MS and paraplegia in setting of transverse myelitis, osteomyelitis (pelvis), acute RF with hypoxia, kidney stones, recurrent UTI, depression, PTSD     Clinical Impressions Patient presents with severe sepsis and symptoms that are improving.  Patient endorses Hx of recurrent UTI which have consistently been at risk of becoming septic due to patient's limited mobility.  Patient was observed to be at or near baseline with self-care, demonstrating adaptive strategies for self-feeding tasks when noon meal was served during time of evaluation.  Patient demonstrated several one-handed techniques for opening wrappers and packets, only requiring assistance with opening foil cover of juice container.  Patient evaluated by Occupational Therapy with no further acute OT needs identified. All education has been completed and the patient has no further questions. See below for any follow-up Occupational Therapy or equipment needs. OT to sign off. Thank you for referral.      If plan is discharge home, recommend the following:   Two people to help with walking and/or transfers;A lot of help with bathing/dressing/bathroom;Assist for transportation     Functional Status Assessment   Patient has not had a recent decline in their functional status (Patient exhibits decreased activity tolerance but performance is grossly at baseline.)     Equipment Recommendations   None recommended by OT      Precautions/Restrictions   Precautions Precautions: Fall Recall of Precautions/Restrictions: Intact Restrictions Weight Bearing Restrictions Per Provider Order: No      Mobility Bed Mobility Overal bed mobility: Needs Assistance Bed Mobility: Rolling Rolling: Mod assist, Used rails    Transfers   General transfer comment: Patient is Deitra lift for transfers at baseline.      Balance Overall balance assessment: Mild deficits observed, not formally tested     ADL either performed or assessed with clinical judgement   ADL Overall ADL's : At baseline;Needs assistance/impaired Eating/Feeding: Set up Eating/Feeding Details (indicate cue type and reason): Patient demonstrated multiple adaptive strategies, including version of modified tripod grasp of fork and use of utensils to manage condiement packets. Grooming: Set up;Sitting   Upper Body Bathing: Moderate assistance;Sitting   Lower Body Bathing: Total assistance;Sitting/lateral leans   Upper Body Dressing : Moderate assistance;Sitting   Lower Body Dressing: Total assistance;Bed level;Sitting/lateral leans   Toilet Transfer: Total assistance   Toileting- Clothing Manipulation and Hygiene: Total assistance   Tub/ Shower Transfer: Total assistance   Functional mobility during ADLs: Maximal assistance;Total assistance;+2 for physical assistance       Vision Baseline Vision/History: 1 Wears glasses Ability to See in Adequate Light: 0 Adequate Patient Visual Report: No change from baseline              Pertinent Vitals/Pain Pain Assessment Pain Assessment: No/denies pain     Extremity/Trunk Assessment Upper Extremity Assessment Upper Extremity Assessment: Right hand dominant;LUE deficits/detail (RUE is [facility] for AROM & strength) LUE Deficits / Details: LUE hemiplegia secondary to transverse myelitis with decreased ROM, strength, and tone. LUE Sensation: decreased light touch;decreased proprioception LUE Coordination: decreased fine motor;decreased gross motor   Lower Extremity Assessment Lower Extremity Assessment: Defer to PT evaluation   Cervical / Trunk Assessment Cervical  / Trunk Assessment:  (  Patient reports scoliosis)   Communication Communication Communication: No apparent difficulties   Cognition Arousal: Alert Behavior During Therapy: WFL for tasks assessed/performed Cognition: No apparent impairments   Following commands: Intact         General Comments     Patient reports use of lateral supports on back cane of WC and use of AD in bed to maintain postural alignment.           Home Living Family/patient expects to be discharged to:: Skilled nursing facility   Additional Comments: Patient reports being SNF resident for 6 yrs.  Uses WC with ROHO cushion, lateral supports, and BLE supports for community mobility.      Prior Functioning/Environment Prior Level of Function : Needs assist   Physical Assist : Mobility (physical);ADLs (physical) Mobility (physical): Bed mobility;Transfers ADLs (physical): Bathing;Dressing;Toileting Mobility Comments: Patient reports able to roll L / R using assist raials on bed; propels manual WC independently; Hoyer for transfers ADLs Comments: Patient reports independent with feeding & grooming; assistance required with UB dressing & bathing; TotalA with LB dressing, bathing, & toileting.    OT Problem List: Decreased range of motion;Decreased activity tolerance;Impaired balance (sitting and/or standing);Decreased coordination;Impaired UE functional use;Decreased strength   OT Treatment/Interventions:        OT Goals(Current goals can be found in the care plan section)   Acute Rehab OT Goals Patient Stated Goal: To return to SNF and work with therapy to increase activity tolerance and trunk control. OT Goal Formulation: With patient Time For Goal Achievement: 01/27/24 Potential to Achieve Goals: Good   AM-PAC OT 6 Clicks Daily Activity     Outcome Measure Help from another person eating meals?: A Little Help from another person taking care of personal grooming?: A Little Help from another  person toileting, which includes using toliet, bedpan, or urinal?: Total Help from another person bathing (including washing, rinsing, drying)?: A Lot Help from another person to put on and taking off regular upper body clothing?: A Lot Help from another person to put on and taking off regular lower body clothing?: Total 6 Click Score: 12   End of Session    Activity Tolerance: Patient tolerated treatment well Patient left: in bed;with call bell/phone within reach;with bed alarm set  OT Visit Diagnosis: Hemiplegia and hemiparesis;Muscle weakness (generalized) (M62.81) Hemiplegia - Right/Left: Left Hemiplegia - dominant/non-dominant: Non-Dominant Hemiplegia - caused by: Unspecified (Transverse myelitis)                Time: 8941-8861 OT Time Calculation (min): 40 min Charges:  OT General Charges $OT Visit: 1 Visit OT Evaluation $OT Eval Moderate Complexity: 1 Mod OT Treatments $Self Care/Home Management : 23-37 mins  Juriel Cid B. Macgregor Aeschliman, MS, OTR/L 01/13/2024, 12:10 PM

## 2024-01-13 NOTE — Plan of Care (Signed)
  Problem: Education: Goal: Knowledge of General Education information will improve Description: Including pain rating scale, medication(s)/side effects and non-pharmacologic comfort measures Outcome: Completed/Met   Problem: Health Behavior/Discharge Planning: Goal: Ability to manage health-related needs will improve Outcome: Completed/Met   Problem: Clinical Measurements: Goal: Ability to maintain clinical measurements within normal limits will improve Outcome: Completed/Met Goal: Will remain free from infection Outcome: Completed/Met Goal: Diagnostic test results will improve Outcome: Completed/Met Goal: Respiratory complications will improve Outcome: Completed/Met Goal: Cardiovascular complication will be avoided Outcome: Completed/Met   Problem: Activity: Goal: Risk for activity intolerance will decrease Outcome: Completed/Met   Problem: Nutrition: Goal: Adequate nutrition will be maintained Outcome: Completed/Met   Problem: Coping: Goal: Level of anxiety will decrease Outcome: Completed/Met   Problem: Elimination: Goal: Will not experience complications related to bowel motility Outcome: Completed/Met Goal: Will not experience complications related to urinary retention Outcome: Completed/Met   Problem: Pain Managment: Goal: General experience of comfort will improve and/or be controlled Outcome: Completed/Met   Problem: Safety: Goal: Ability to remain free from injury will improve Outcome: Completed/Met   Problem: Skin Integrity: Goal: Risk for impaired skin integrity will decrease Outcome: Completed/Met  Plan for discharge
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
## 2024-01-13 NOTE — Progress Notes (Addendum)
 Attempted to call report to [facility] for patient discharge at 1348 with no answer.   [id number]- Report given to Astronomer at [facility]. All questions answered. Discharge paper work discussed with patient and placed in discharge packet for facility

## 2024-01-13 NOTE — Discharge Summary (Signed)
 Physician Discharge Summary  [name] FMW:[id number] DOB: December 16, 1980 DOA: 01/11/2024  PCP: Pcp, No  Admit date: 01/11/2024 Discharge date: 01/13/2024 30 Day Unplanned Readmission Risk Score    Flowsheet Row ED to Hosp-Admission (Current) from 01/11/2024 in [facility] 4TH FLOOR PROGRESSIVE CARE AND UROLOGY  30 Day Unplanned Readmission Risk Score (%) 14.8 Filed at 01/13/2024 0801    This score is the patient's risk of an unplanned readmission within 30 days of being discharged (0 -100%). The score is based on dignosis, age, lab data, medications, orders, and past utilization.   Low:  0-14.9   Medium: 15-21.9   High: 22-29.9   Extreme: 30 and above          Admitted From:  LTC/nursing facility Disposition:  LTC/nursing facility  Recommendations for Outpatient Follow-up:  Follow up with PCP in 1-2 weeks Please obtain BMP/CBC in one week Please follow up with your PCP on the following pending results: Unresulted Labs (From admission, onward)     Start     Ordered   01/11/24 0620  CBC with Differential  (Undifferentiated presentation (screening labs and basic nursing orders))  ONCE - STAT,   STAT        01/11/24 0620              Home Health: None Equipment/Devices: None  Discharge Condition: Stable CODE STATUS: Full code Diet recommendation:  Diet Order             Diet regular Room service appropriate? Yes; Fluid consistency: Thin  Diet effective now                   Subjective: Seen and examined, she has no complaint other than some sinus congestion.  She is in agreement with going back to the facility today.  Brief/Interim Summary: [name] is a 43 y.o. 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  She came in  with symptoms of cough and generalized weakness for 2 days.  She was noted to have fever and chills at the facility so she was sent to the emergency department.  Admitted for the following.   Severe sepsis secondary to complicated UTI for patient with history of ileal conduit and recurrent UTIs.  Met severe sepsis criteria based on fever, tachycardia, tachypnea, leukocytosis and lactic acid of 3.  Lactic acidosis, as well as leukocytosis improved, overall sepsis physiology has improved.  Patient was started on cefepime , urine cultures growing Klebsiella pneumoniae and Proteus mirabilis both of them are sensitive to Bactrim  DS and thus patient is going to be discharged on Bactrim  DS for 5 more days to complete 7-day course.   Last temperature spike 100.7 at 3 PM 01/11/2024.  Afebrile ever since.   Paraplegia-baclofen    Chronic constipation-Home bowel regimen will be resumed   GERD-omeprazole   Depression/PTSD-resume all home medications.  Discharge plan was discussed with patient and/or family member and they verbalized understanding and agreed with it.  Discharge Diagnoses:  Active Problems:   Multiple sclerosis   Complicated UTI (urinary tract infection)   Paraplegia (HCC)   History of transverse myelitis   Severe sepsis [facility])    Discharge Instructions   Allergies as of 01/13/2024       Reactions   Iodine Anaphylaxis   Latex Anaphylaxis   Other  Other (See Comments)   Documented on Order Summary Report - unknown reaction Apricot, collard greens, green bean, melon, peaches, turnip greens   Polymyxin B  Hives   Pt had hives in right arm with infusion of polymyxin on November 26th, 2018   Shellfish Protein-containing Drug Products Anaphylaxis, Other (See Comments)   All seafood   Vancomycin Anaphylaxis   Aspirin Other (See Comments)   Allergic, per [place]   Fish Protein-containing Drug Products Other (See Comments)   Allergic, per [place]   Iodinated Contrast Media  Hives, Other (See Comments)   Patient requires 13 hour  prep prior to administration   Strawberry Extract Hives, Rash        Medication List     TAKE these medications    acetaminophen  325 MG tablet Commonly known as: TYLENOL  Take 650 mg by mouth every 4 (four) hours as needed for headache, mild pain or fever. Take 650 mg by mouth two times a day and every 8 hours as needed for general discomfort   acetaminophen  500 MG tablet Commonly known as: TYLENOL  Take 1,000 mg by mouth in the morning, at noon, and at bedtime.   Albuterol  Sulfate 108 (90 Base) MCG/ACT Aepb Commonly known as: PROAIR  RESPICLICK Inhale 2 puffs into the lungs every 6 (six) hours as needed.   Artificial Tears ophthalmic solution Place 1 drop into both eyes in the morning and at bedtime.   ascorbic acid  500 MG tablet Commonly known as: VITAMIN C  Take 500 mg by mouth in the morning.   baclofen  20 MG tablet Commonly known as: LIORESAL  Take 1 tablet (20 mg total) by mouth 4 (four) times daily.   CeraVe PM Lotn Apply 1 application  topically See admin instructions. Apply to the face every morning after cleansing   diazepam  5 MG tablet Commonly known as: VALIUM  Take 1-2 tablets (5-10 mg total) by mouth 2 (two) times daily. Take 1 tablet (5 mg) BID and Take 2 tablets (10 mg) at bedtime What changed:  when to take this additional instructions   EpiPen  2-Pak 0.3 MG/0.3ML Soaj injection Generic drug: EPINEPHrine  Inject 0.3 mg into the muscle as needed for anaphylaxis.   Fleet Enema 7-19 GM/197ML Enem Place 118 mLs rectally as needed.   gabapentin  300 MG capsule Commonly known as: NEURONTIN  Take 3 caps ( 900 mg) Three times a day What changed:  how much to take how to take this when to take this additional instructions   guaifenesin  100 MG/5ML syrup Commonly known as: ROBITUSSIN Take 30 mLs by mouth every 6 (six) hours as needed for cough or congestion.   ipratropium-albuterol  0.5-2.5 (3) MG/3ML  Soln Commonly known as: DUONEB Take 3 mLs by nebulization every 4 (four) hours as needed.   LaMICtal  100 MG tablet Generic drug: lamoTRIgine  Take 100 mg by mouth 2 (two) times daily.   loratadine  10 MG tablet Commonly known as: CLARITIN  Take 10 mg by mouth as needed for allergies.   magnesium  hydroxide 400 MG/5ML suspension Commonly known as: MILK OF MAGNESIA Take 30 mLs by mouth as needed for mild constipation.   multivitamin with minerals tablet Take 1 tablet by mouth in the morning.   omeprazole 20 MG capsule Commonly known as: PRILOSEC Take 20 mg by mouth in the morning.   PreviDent 5000 Plus 1.1 % Crea dental cream Generic drug: sodium fluoride Place 1 Application onto teeth at bedtime.   promethazine  12.5 MG tablet Commonly known as: PHENERGAN  Take 12.5 mg by mouth every  6 (six) hours as needed for nausea or vomiting.   SALINE MIST SPRAY NA Place 1 spray into the nose every 4 (four) hours as needed.   SERTRALINE  HCL PO Take 75 mg by mouth in the morning.   sulfamethoxazole -trimethoprim  800-160 MG tablet Commonly known as: Bactrim  DS Take 1 tablet by mouth 2 (two) times daily for 5 days.   UNABLE TO FIND Take 1 Dose by mouth daily. Med Pass 2.0 Liquid: Once a day for enhanced k/cal supplement.   Veozah 45 MG Tabs Generic drug: Fezolinetant Take 1 tablet by mouth in the morning.        Follow-up Information     PCP Follow up in 1 week(s).                 Allergies  Allergen Reactions   Iodine Anaphylaxis   Latex Anaphylaxis   Other Other (See Comments)    Documented on Order Summary Report - unknown reaction Apricot, collard greens, green bean, melon, peaches, turnip greens   Polymyxin B  Hives    Pt had hives in right arm with infusion of polymyxin on November 26th, 2018   Shellfish Protein-Containing Drug Products Anaphylaxis and Other (See Comments)    All seafood   Vancomycin Anaphylaxis   Aspirin Other (See Comments)    Allergic,  per [place]   Fish Protein-Containing Drug Products Other (See Comments)    Allergic, per [place]   Iodinated Contrast Media Hives and Other (See Comments)    Patient requires 13 hour  prep prior to administration   Strawberry Extract Hives and Rash    Consultations: None   Procedures/Studies: DG Chest Port 1 View Result Date: 01/11/2024 EXAM: 1 VIEW(S) XRAY OF THE CHEST 01/11/2024 07:10:00 AM COMPARISON: 02/12/2023 CLINICAL HISTORY: Questionable sepsis - evaluate for abnormality. Pt BIB EMS coming from [facility] c/o of sore throat, and flu like symptoms that started yesterday. Also c/o CP and fever. Pt is bed bound. FINDINGS: LINES, TUBES AND DEVICES: EKG leads noted. LUNGS AND PLEURA: No focal pulmonary opacity. No pulmonary edema. No pleural effusion. No pneumothorax. HEART AND MEDIASTINUM: No acute abnormality of the cardiac and mediastinal silhouettes. BONES AND SOFT TISSUES: No acute osseous abnormality. IMPRESSION: 1. No acute cardiopulmonary process detected. Electronically signed by: Waddell Calk MD 01/11/2024 07:20 AM EDT RP Workstation: [id number]     Discharge Exam: Vitals:   01/13/24 0543 01/13/24 1100  BP: 109/63   Pulse: 82   Resp: 18 18  Temp: 98.1 F (36.7 C)   SpO2: 98%    Vitals:   01/12/24 1306 01/12/24 2016 01/13/24 0543 01/13/24 1100  BP:  109/69 109/63   Pulse:  80 82   Resp:  18 18 18   Temp: 98.8 F (37.1 C) 98.2 F (36.8 C) 98.1 F (36.7 C)   TempSrc: Oral     SpO2:  99% 98%   Weight:      Height:        General: Pt is alert, awake, not in acute distress Cardiovascular: RRR, S1/S2 +, no rubs, no gallops Respiratory: CTA bilaterally, no wheezing, no rhonchi Abdominal: Soft, NT, ND, bowel sounds +, urinary bag attached to the abdomen. Extremities: no edema, no cyanosis Neuro: Paraplegia.   The results of significant diagnostics from this hospitalization (including imaging, microbiology, ancillary and laboratory) are listed below  for reference.     Microbiology: Recent Results (from the past 240 hours)  Blood Culture (routine x 2)     Status:  None (Preliminary result)   Collection Time: 01/11/24  6:17 AM   Specimen: BLOOD  Result Value Ref Range Status   Specimen Description   Final    BLOOD BLOOD RIGHT FOREARM Performed at [facility], [address] [place], [place] [address]    Special Requests   Final    BOTTLES DRAWN AEROBIC AND ANAEROBIC Blood Culture results may not be optimal due to an inadequate volume of blood received in culture bottles Performed at [facility], [address] [place], [place] [address]    Culture   Final    NO GROWTH 2 DAYS Performed at [facility], [address] [place], [place] [address]    Report Status PENDING  Incomplete  Group A Strep by PCR     Status: None   Collection Time: 01/11/24  6:30 AM   Specimen: Throat; Sterile Swab  Result Value Ref Range Status   Group A Strep by PCR NOT DETECTED NOT DETECTED Final    Comment: Performed at [facility], [address], [place], [place] [address]  Resp panel by RT-PCR (RSV, Flu A&B, Covid) Throat     Status: None   Collection Time: 01/11/24  6:30 AM   Specimen: Throat; Nasal Swab  Result Value Ref Range Status   SARS Coronavirus 2 by RT PCR NEGATIVE NEGATIVE Final    Comment: (NOTE) SARS-CoV-2 target nucleic acids are NOT DETECTED.  The SARS-CoV-2 RNA is generally detectable in upper respiratory specimens during the acute phase of infection. The lowest concentration of SARS-CoV-2 viral copies this assay can detect is 138 copies/mL. A negative result does not preclude SARS-Cov-2 infection and should not be used as the sole basis for treatment or other patient management decisions. A negative result may occur with  improper specimen collection/handling, submission of specimen other than nasopharyngeal swab, presence of viral mutation(s) within  the areas targeted by this assay, and inadequate number of viral copies(<138 copies/mL). A negative result must be combined with clinical observations, patient history, and epidemiological information. The expected result is Negative.  Fact Sheet for Patients:  BloggerCourse.com  Fact Sheet for Healthcare Providers:  SeriousBroker.it  This test is no t yet approved or cleared by the [place]  FDA and  has been authorized for detection and/or diagnosis of SARS-CoV-2 by FDA under an Emergency Use Authorization (EUA). This EUA will remain  in effect (meaning this test can be used) for the duration of the COVID-19 declaration under Section 564(b)(1) of the Act, 21 U.S.C.section 360bbb-3(b)(1), unless the authorization is terminated  or revoked sooner.       Influenza A by PCR NEGATIVE NEGATIVE Final   Influenza B by PCR NEGATIVE NEGATIVE Final    Comment: (NOTE) The Xpert Xpress SARS-CoV-2/FLU/RSV plus assay is intended as an aid in the diagnosis of influenza from Nasopharyngeal swab specimens and should not be used as a sole basis for treatment. Nasal washings and aspirates are unacceptable for Xpert Xpress SARS-CoV-2/FLU/RSV testing.  Fact Sheet for Patients: BloggerCourse.com  Fact Sheet for Healthcare Providers: SeriousBroker.it  This test is not yet approved or cleared by the [place]  FDA and has been authorized for detection and/or diagnosis of SARS-CoV-2 by FDA under an Emergency Use Authorization (EUA). This EUA will remain in effect (meaning this test can be used) for the duration of the COVID-19 declaration under Section 564(b)(1) of the Act, 21 U.S.C. section 360bbb-3(b)(1), unless the authorization is terminated or revoked.  Resp Syncytial Virus by PCR NEGATIVE NEGATIVE Final    Comment: (NOTE) Fact Sheet for  Patients: BloggerCourse.com  Fact Sheet for Healthcare Providers: SeriousBroker.it  This test is not yet approved or cleared by the [place]  FDA and has been authorized for detection and/or diagnosis of SARS-CoV-2 by FDA under an Emergency Use Authorization (EUA). This EUA will remain in effect (meaning this test can be used) for the duration of the COVID-19 declaration under Section 564(b)(1) of the Act, 21 U.S.C. section 360bbb-3(b)(1), unless the authorization is terminated or revoked.  Performed at [facility], [address] [place], [place] [address]   Urine Culture     Status: Abnormal   Collection Time: 01/11/24  7:53 AM   Specimen: Urine, Random  Result Value Ref Range Status   Specimen Description   Final    URINE, RANDOM Performed at [facility], [address] [place], [place] [address]    Special Requests   Final    NONE Reflexed from [id number] Performed at [facility], [address] [place], [place] [address]    Culture (A)  Final    60,000 COLONIES/mL KLEBSIELLA PNEUMONIAE 40,000 COLONIES/mL PROTEUS MIRABILIS    Report Status 01/13/2024 FINAL  Final   Organism ID, Bacteria KLEBSIELLA PNEUMONIAE (A)  Final   Organism ID, Bacteria PROTEUS MIRABILIS (A)  Final      Susceptibility   Klebsiella pneumoniae - MIC*    AMPICILLIN  >=32 RESISTANT Resistant     CEFAZOLIN (URINE) Value in next row Sensitive      2 SENSITIVEThis is a modified FDA-approved test that has been validated and its performance characteristics determined by the reporting laboratory.  This laboratory is certified under the Clinical Laboratory Improvement Amendments CLIA as qualified to perform high complexity clinical laboratory testing.    CEFEPIME  Value in next row Sensitive      2 SENSITIVEThis is a modified FDA-approved test that has been validated and its performance  characteristics determined by the reporting laboratory.  This laboratory is certified under the Clinical Laboratory Improvement Amendments CLIA as qualified to perform high complexity clinical laboratory testing.    ERTAPENEM Value in next row Sensitive      2 SENSITIVEThis is a modified FDA-approved test that has been validated and its performance characteristics determined by the reporting laboratory.  This laboratory is certified under the Clinical Laboratory Improvement Amendments CLIA as qualified to perform high complexity clinical laboratory testing.    CEFTRIAXONE  Value in next row Sensitive      2 SENSITIVEThis is a modified FDA-approved test that has been validated and its performance characteristics determined by the reporting laboratory.  This laboratory is certified under the Clinical Laboratory Improvement Amendments CLIA as qualified to perform high complexity clinical laboratory testing.    CIPROFLOXACIN  Value in next row Sensitive      2 SENSITIVEThis is a modified FDA-approved test that has been validated and its performance characteristics determined by the reporting laboratory.  This laboratory is certified under the Clinical Laboratory Improvement Amendments CLIA as qualified to perform high complexity clinical laboratory testing.    GENTAMICIN Value in next row Sensitive      2 SENSITIVEThis is a modified FDA-approved test that has been validated and its performance characteristics determined by the reporting laboratory.  This laboratory is certified under the Clinical Laboratory Improvement Amendments CLIA as qualified to perform high complexity clinical laboratory testing.    NITROFURANTOIN Value in next row Sensitive  2 SENSITIVEThis is a modified FDA-approved test that has been validated and its performance characteristics determined by the reporting laboratory.  This laboratory is certified under the Clinical Laboratory Improvement Amendments CLIA as qualified to perform high  complexity clinical laboratory testing.    TRIMETH /SULFA  Value in next row Sensitive      2 SENSITIVEThis is a modified FDA-approved test that has been validated and its performance characteristics determined by the reporting laboratory.  This laboratory is certified under the Clinical Laboratory Improvement Amendments CLIA as qualified to perform high complexity clinical laboratory testing.    AMPICILLIN /SULBACTAM Value in next row Sensitive      2 SENSITIVEThis is a modified FDA-approved test that has been validated and its performance characteristics determined by the reporting laboratory.  This laboratory is certified under the Clinical Laboratory Improvement Amendments CLIA as qualified to perform high complexity clinical laboratory testing.    PIP/TAZO Value in next row Sensitive      <=4 SENSITIVEThis is a modified FDA-approved test that has been validated and its performance characteristics determined by the reporting laboratory.  This laboratory is certified under the Clinical Laboratory Improvement Amendments CLIA as qualified to perform high complexity clinical laboratory testing.    MEROPENEM  Value in next row Sensitive      <=4 SENSITIVEThis is a modified FDA-approved test that has been validated and its performance characteristics determined by the reporting laboratory.  This laboratory is certified under the Clinical Laboratory Improvement Amendments CLIA as qualified to perform high complexity clinical laboratory testing.    * 60,000 COLONIES/mL KLEBSIELLA PNEUMONIAE   Proteus mirabilis - MIC*    AMPICILLIN  Value in next row Sensitive      <=4 SENSITIVEThis is a modified FDA-approved test that has been validated and its performance characteristics determined by the reporting laboratory.  This laboratory is certified under the Clinical Laboratory Improvement Amendments CLIA as qualified to perform high complexity clinical laboratory testing.    CEFAZOLIN (URINE) Value in next row  Sensitive      4 SENSITIVEThis is a modified FDA-approved test that has been validated and its performance characteristics determined by the reporting laboratory.  This laboratory is certified under the Clinical Laboratory Improvement Amendments CLIA as qualified to perform high complexity clinical laboratory testing.    CEFEPIME  Value in next row Sensitive      4 SENSITIVEThis is a modified FDA-approved test that has been validated and its performance characteristics determined by the reporting laboratory.  This laboratory is certified under the Clinical Laboratory Improvement Amendments CLIA as qualified to perform high complexity clinical laboratory testing.    ERTAPENEM Value in next row Sensitive      4 SENSITIVEThis is a modified FDA-approved test that has been validated and its performance characteristics determined by the reporting laboratory.  This laboratory is certified under the Clinical Laboratory Improvement Amendments CLIA as qualified to perform high complexity clinical laboratory testing.    CEFTRIAXONE  Value in next row Sensitive      4 SENSITIVEThis is a modified FDA-approved test that has been validated and its performance characteristics determined by the reporting laboratory.  This laboratory is certified under the Clinical Laboratory Improvement Amendments CLIA as qualified to perform high complexity clinical laboratory testing.    CIPROFLOXACIN  Value in next row Sensitive      4 SENSITIVEThis is a modified FDA-approved test that has been validated and its performance characteristics determined by the reporting laboratory.  This laboratory is certified under the Clinical Laboratory Improvement Amendments CLIA as  qualified to perform high complexity clinical laboratory testing.    GENTAMICIN Value in next row Sensitive      4 SENSITIVEThis is a modified FDA-approved test that has been validated and its performance characteristics determined by the reporting laboratory.  This  laboratory is certified under the Clinical Laboratory Improvement Amendments CLIA as qualified to perform high complexity clinical laboratory testing.    NITROFURANTOIN Value in next row Resistant      4 SENSITIVEThis is a modified FDA-approved test that has been validated and its performance characteristics determined by the reporting laboratory.  This laboratory is certified under the Clinical Laboratory Improvement Amendments CLIA as qualified to perform high complexity clinical laboratory testing.    TRIMETH /SULFA  Value in next row Sensitive      4 SENSITIVEThis is a modified FDA-approved test that has been validated and its performance characteristics determined by the reporting laboratory.  This laboratory is certified under the Clinical Laboratory Improvement Amendments CLIA as qualified to perform high complexity clinical laboratory testing.    AMPICILLIN /SULBACTAM Value in next row Sensitive      4 SENSITIVEThis is a modified FDA-approved test that has been validated and its performance characteristics determined by the reporting laboratory.  This laboratory is certified under the Clinical Laboratory Improvement Amendments CLIA as qualified to perform high complexity clinical laboratory testing.    PIP/TAZO Value in next row Sensitive      <=4 SENSITIVEThis is a modified FDA-approved test that has been validated and its performance characteristics determined by the reporting laboratory.  This laboratory is certified under the Clinical Laboratory Improvement Amendments CLIA as qualified to perform high complexity clinical laboratory testing.    MEROPENEM  Value in next row Sensitive      <=4 SENSITIVEThis is a modified FDA-approved test that has been validated and its performance characteristics determined by the reporting laboratory.  This laboratory is certified under the Clinical Laboratory Improvement Amendments CLIA as qualified to perform high complexity clinical laboratory testing.    *  40,000 COLONIES/mL PROTEUS MIRABILIS  Blood Culture (routine x 2)     Status: None (Preliminary result)   Collection Time: 01/11/24  3:45 PM   Specimen: BLOOD RIGHT HAND  Result Value Ref Range Status   Specimen Description   Final    BLOOD RIGHT HAND Performed at [facility], [address] [place], [place] [address]    Special Requests   Final    BOTTLES DRAWN AEROBIC ONLY Blood Culture results may not be optimal due to an inadequate volume of blood received in culture bottles Performed at [facility], [address], [place], [place] [address]    Culture   Final    NO GROWTH 2 DAYS Performed at [facility], [address], [place], [place] [address]    Report Status PENDING  Incomplete  MRSA Next Gen by PCR, Nasal     Status: None   Collection Time: 01/12/24  5:01 PM   Specimen: Nasal Mucosa; Nasal Swab  Result Value Ref Range Status   MRSA by PCR Next Gen NOT DETECTED NOT DETECTED Final    Comment: (NOTE) The GeneXpert MRSA Assay (FDA approved for NASAL specimens only), is one component of a comprehensive MRSA colonization surveillance program. It is not intended to diagnose MRSA infection nor to guide or monitor treatment for MRSA infections. Test performance is not FDA approved in patients less than 14 years old. Performed at [facility], [address]  [place], [place] [address]      Labs: BNP (last 3 results) No results for input(s): BNP in the last 8760 hours. Basic Metabolic Panel: Recent Labs  Lab 01/11/24 0617 01/12/24 0240  NA 139 136  K 4.5 3.5  CL 101 103  CO2 23 23  GLUCOSE 110* 91  BUN 10 7  CREATININE 0.45 0.32*  CALCIUM  9.7 8.8*   Liver Function Tests: Recent Labs  Lab 01/11/24 0617  AST 29  ALT 23  ALKPHOS 92  BILITOT 0.6  PROT 6.7  ALBUMIN  4.7   No results for input(s): LIPASE, AMYLASE in the last 168 hours. No results for input(s): AMMONIA in the last 168  hours. CBC: Recent Labs  Lab 01/11/24 0617 01/12/24 0240  WBC 13.5* 7.2  NEUTROABS 12.5*  --   HGB 13.8 11.7*  HCT 42.8 36.6  MCV 85.8 87.1  PLT 203 161   Cardiac Enzymes: No results for input(s): CKTOTAL, CKMB, CKMBINDEX, TROPONINI in the last 168 hours. BNP: Invalid input(s): POCBNP CBG: No results for input(s): GLUCAP in the last 168 hours. D-Dimer No results for input(s): DDIMER in the last 72 hours. Hgb A1c No results for input(s): HGBA1C in the last 72 hours. Lipid Profile No results for input(s): CHOL, HDL, LDLCALC, TRIG, CHOLHDL, LDLDIRECT in the last 72 hours. Thyroid function studies No results for input(s): TSH, T4TOTAL, T3FREE, THYROIDAB in the last 72 hours.  Invalid input(s): FREET3 Anemia work up No results for input(s): VITAMINB12, FOLATE, FERRITIN, TIBC, IRON, RETICCTPCT in the last 72 hours. Urinalysis    Component Value Date/Time   COLORURINE YELLOW 01/11/2024 0753   APPEARANCEUR HAZY (A) 01/11/2024 0753   LABSPEC 1.018 01/11/2024 0753   PHURINE 5.0 01/11/2024 0753   GLUCOSEU NEGATIVE 01/11/2024 0753   HGBUR SMALL (A) 01/11/2024 0753   BILIRUBINUR NEGATIVE 01/11/2024 0753   KETONESUR NEGATIVE 01/11/2024 0753   PROTEINUR NEGATIVE 01/11/2024 0753   UROBILINOGEN 2.0 (H) 10/29/2014 1627   NITRITE NEGATIVE 01/11/2024 0753   LEUKOCYTESUR MODERATE (A) 01/11/2024 0753   Sepsis Labs Recent Labs  Lab 01/11/24 0617 01/12/24 0240  WBC 13.5* 7.2   Microbiology Recent Results (from the past 240 hours)  Blood Culture (routine x 2)     Status: None (Preliminary result)   Collection Time: 01/11/24  6:17 AM   Specimen: BLOOD  Result Value Ref Range Status   Specimen Description   Final    BLOOD BLOOD RIGHT FOREARM Performed at [facility], [address] [place], [place] [address]    Special Requests   Final    BOTTLES DRAWN AEROBIC AND ANAEROBIC Blood Culture results may not be  optimal due to an inadequate volume of blood received in culture bottles Performed at [facility], [address] [place], [place] [address]    Culture   Final    NO GROWTH 2 DAYS Performed at [facility], [address] [place], [place] [address]    Report Status PENDING  Incomplete  Group A Strep by PCR     Status: None   Collection Time: 01/11/24  6:30 AM   Specimen: Throat; Sterile Swab  Result Value Ref Range Status   Group A Strep by PCR NOT DETECTED NOT DETECTED Final    Comment: Performed at [facility], [address] [place], [place] [address]  Resp panel by RT-PCR (RSV, Flu A&B, Covid) Throat     Status: None   Collection Time: 01/11/24  6:30 AM  Specimen: Throat; Nasal Swab  Result Value Ref Range Status   SARS Coronavirus 2 by RT PCR NEGATIVE NEGATIVE Final    Comment: (NOTE) SARS-CoV-2 target nucleic acids are NOT DETECTED.  The SARS-CoV-2 RNA is generally detectable in upper respiratory specimens during the acute phase of infection. The lowest concentration of SARS-CoV-2 viral copies this assay can detect is 138 copies/mL. A negative result does not preclude SARS-Cov-2 infection and should not be used as the sole basis for treatment or other patient management decisions. A negative result may occur with  improper specimen collection/handling, submission of specimen other than nasopharyngeal swab, presence of viral mutation(s) within the areas targeted by this assay, and inadequate number of viral copies(<138 copies/mL). A negative result must be combined with clinical observations, patient history, and epidemiological information. The expected result is Negative.  Fact Sheet for Patients:  BloggerCourse.com  Fact Sheet for Healthcare Providers:  SeriousBroker.it  This test is no t yet approved or cleared by the [place]  FDA and  has been authorized for  detection and/or diagnosis of SARS-CoV-2 by FDA under an Emergency Use Authorization (EUA). This EUA will remain  in effect (meaning this test can be used) for the duration of the COVID-19 declaration under Section 564(b)(1) of the Act, 21 U.S.C.section 360bbb-3(b)(1), unless the authorization is terminated  or revoked sooner.       Influenza A by PCR NEGATIVE NEGATIVE Final   Influenza B by PCR NEGATIVE NEGATIVE Final    Comment: (NOTE) The Xpert Xpress SARS-CoV-2/FLU/RSV plus assay is intended as an aid in the diagnosis of influenza from Nasopharyngeal swab specimens and should not be used as a sole basis for treatment. Nasal washings and aspirates are unacceptable for Xpert Xpress SARS-CoV-2/FLU/RSV testing.  Fact Sheet for Patients: BloggerCourse.com  Fact Sheet for Healthcare Providers: SeriousBroker.it  This test is not yet approved or cleared by the [place]  FDA and has been authorized for detection and/or diagnosis of SARS-CoV-2 by FDA under an Emergency Use Authorization (EUA). This EUA will remain in effect (meaning this test can be used) for the duration of the COVID-19 declaration under Section 564(b)(1) of the Act, 21 U.S.C. section 360bbb-3(b)(1), unless the authorization is terminated or revoked.     Resp Syncytial Virus by PCR NEGATIVE NEGATIVE Final    Comment: (NOTE) Fact Sheet for Patients: BloggerCourse.com  Fact Sheet for Healthcare Providers: SeriousBroker.it  This test is not yet approved or cleared by the [place]  FDA and has been authorized for detection and/or diagnosis of SARS-CoV-2 by FDA under an Emergency Use Authorization (EUA). This EUA will remain in effect (meaning this test can be used) for the duration of the COVID-19 declaration under Section 564(b)(1) of the Act, 21 U.S.C. section 360bbb-3(b)(1), unless the authorization is  terminated or revoked.  Performed at [facility], [address] [place], [place] [address]   Urine Culture     Status: Abnormal   Collection Time: 01/11/24  7:53 AM   Specimen: Urine, Random  Result Value Ref Range Status   Specimen Description   Final    URINE, RANDOM Performed at [facility], [address] [place], [place] [address]    Special Requests   Final    NONE Reflexed from [id number] Performed at [facility], [address] [place], [place] [address]    Culture (A)  Final    60,000 COLONIES/mL KLEBSIELLA PNEUMONIAE 40,000 COLONIES/mL PROTEUS MIRABILIS    Report Status 01/13/2024 FINAL  Final   Organism ID, Bacteria KLEBSIELLA PNEUMONIAE (A)  Final   Organism ID, Bacteria PROTEUS MIRABILIS (A)  Final      Susceptibility   Klebsiella pneumoniae - MIC*    AMPICILLIN  >=32 RESISTANT Resistant     CEFAZOLIN (URINE) Value in next row Sensitive      2 SENSITIVEThis is a modified FDA-approved test that has been validated and its performance characteristics determined by the reporting laboratory.  This laboratory is certified under the Clinical Laboratory Improvement Amendments CLIA as qualified to perform high complexity clinical laboratory testing.    CEFEPIME  Value in next row Sensitive      2 SENSITIVEThis is a modified FDA-approved test that has been validated and its performance characteristics determined by the reporting laboratory.  This laboratory is certified under the Clinical Laboratory Improvement Amendments CLIA as qualified to perform high complexity clinical laboratory testing.    ERTAPENEM Value in next row Sensitive      2 SENSITIVEThis is a modified FDA-approved test that has been validated and its performance characteristics determined by the reporting laboratory.  This laboratory is certified under the Clinical Laboratory Improvement Amendments CLIA as qualified to perform high complexity clinical  laboratory testing.    CEFTRIAXONE  Value in next row Sensitive      2 SENSITIVEThis is a modified FDA-approved test that has been validated and its performance characteristics determined by the reporting laboratory.  This laboratory is certified under the Clinical Laboratory Improvement Amendments CLIA as qualified to perform high complexity clinical laboratory testing.    CIPROFLOXACIN  Value in next row Sensitive      2 SENSITIVEThis is a modified FDA-approved test that has been validated and its performance characteristics determined by the reporting laboratory.  This laboratory is certified under the Clinical Laboratory Improvement Amendments CLIA as qualified to perform high complexity clinical laboratory testing.    GENTAMICIN Value in next row Sensitive      2 SENSITIVEThis is a modified FDA-approved test that has been validated and its performance characteristics determined by the reporting laboratory.  This laboratory is certified under the Clinical Laboratory Improvement Amendments CLIA as qualified to perform high complexity clinical laboratory testing.    NITROFURANTOIN Value in next row Sensitive      2 SENSITIVEThis is a modified FDA-approved test that has been validated and its performance characteristics determined by the reporting laboratory.  This laboratory is certified under the Clinical Laboratory Improvement Amendments CLIA as qualified to perform high complexity clinical laboratory testing.    TRIMETH /SULFA  Value in next row Sensitive      2 SENSITIVEThis is a modified FDA-approved test that has been validated and its performance characteristics determined by the reporting laboratory.  This laboratory is certified under the Clinical Laboratory Improvement Amendments CLIA as qualified to perform high complexity clinical laboratory testing.    AMPICILLIN /SULBACTAM Value in next row Sensitive      2 SENSITIVEThis is a modified FDA-approved test that has been validated and its  performance characteristics determined by the reporting laboratory.  This laboratory is certified under the Clinical Laboratory Improvement Amendments CLIA as qualified to perform high complexity clinical laboratory testing.    PIP/TAZO Value in next row Sensitive      <=4 SENSITIVEThis is a modified FDA-approved test that has been validated and its performance characteristics determined by the reporting laboratory.  This laboratory is certified under the Clinical Laboratory Improvement Amendments CLIA as qualified to perform high complexity clinical laboratory testing.    MEROPENEM  Value in  next row Sensitive      <=4 SENSITIVEThis is a modified FDA-approved test that has been validated and its performance characteristics determined by the reporting laboratory.  This laboratory is certified under the Clinical Laboratory Improvement Amendments CLIA as qualified to perform high complexity clinical laboratory testing.    * 60,000 COLONIES/mL KLEBSIELLA PNEUMONIAE   Proteus mirabilis - MIC*    AMPICILLIN  Value in next row Sensitive      <=4 SENSITIVEThis is a modified FDA-approved test that has been validated and its performance characteristics determined by the reporting laboratory.  This laboratory is certified under the Clinical Laboratory Improvement Amendments CLIA as qualified to perform high complexity clinical laboratory testing.    CEFAZOLIN (URINE) Value in next row Sensitive      4 SENSITIVEThis is a modified FDA-approved test that has been validated and its performance characteristics determined by the reporting laboratory.  This laboratory is certified under the Clinical Laboratory Improvement Amendments CLIA as qualified to perform high complexity clinical laboratory testing.    CEFEPIME  Value in next row Sensitive      4 SENSITIVEThis is a modified FDA-approved test that has been validated and its performance characteristics determined by the reporting laboratory.  This laboratory is  certified under the Clinical Laboratory Improvement Amendments CLIA as qualified to perform high complexity clinical laboratory testing.    ERTAPENEM Value in next row Sensitive      4 SENSITIVEThis is a modified FDA-approved test that has been validated and its performance characteristics determined by the reporting laboratory.  This laboratory is certified under the Clinical Laboratory Improvement Amendments CLIA as qualified to perform high complexity clinical laboratory testing.    CEFTRIAXONE  Value in next row Sensitive      4 SENSITIVEThis is a modified FDA-approved test that has been validated and its performance characteristics determined by the reporting laboratory.  This laboratory is certified under the Clinical Laboratory Improvement Amendments CLIA as qualified to perform high complexity clinical laboratory testing.    CIPROFLOXACIN  Value in next row Sensitive      4 SENSITIVEThis is a modified FDA-approved test that has been validated and its performance characteristics determined by the reporting laboratory.  This laboratory is certified under the Clinical Laboratory Improvement Amendments CLIA as qualified to perform high complexity clinical laboratory testing.    GENTAMICIN Value in next row Sensitive      4 SENSITIVEThis is a modified FDA-approved test that has been validated and its performance characteristics determined by the reporting laboratory.  This laboratory is certified under the Clinical Laboratory Improvement Amendments CLIA as qualified to perform high complexity clinical laboratory testing.    NITROFURANTOIN Value in next row Resistant      4 SENSITIVEThis is a modified FDA-approved test that has been validated and its performance characteristics determined by the reporting laboratory.  This laboratory is certified under the Clinical Laboratory Improvement Amendments CLIA as qualified to perform high complexity clinical laboratory testing.    TRIMETH /SULFA  Value in next row  Sensitive      4 SENSITIVEThis is a modified FDA-approved test that has been validated and its performance characteristics determined by the reporting laboratory.  This laboratory is certified under the Clinical Laboratory Improvement Amendments CLIA as qualified to perform high complexity clinical laboratory testing.    AMPICILLIN /SULBACTAM Value in next row Sensitive      4 SENSITIVEThis is a modified FDA-approved test that has been validated and its performance characteristics determined by the reporting laboratory.  This laboratory is certified  under the Clinical Laboratory Improvement Amendments CLIA as qualified to perform high complexity clinical laboratory testing.    PIP/TAZO Value in next row Sensitive      <=4 SENSITIVEThis is a modified FDA-approved test that has been validated and its performance characteristics determined by the reporting laboratory.  This laboratory is certified under the Clinical Laboratory Improvement Amendments CLIA as qualified to perform high complexity clinical laboratory testing.    MEROPENEM  Value in next row Sensitive      <=4 SENSITIVEThis is a modified FDA-approved test that has been validated and its performance characteristics determined by the reporting laboratory.  This laboratory is certified under the Clinical Laboratory Improvement Amendments CLIA as qualified to perform high complexity clinical laboratory testing.    * 40,000 COLONIES/mL PROTEUS MIRABILIS  Blood Culture (routine x 2)     Status: None (Preliminary result)   Collection Time: 01/11/24  3:45 PM   Specimen: BLOOD RIGHT HAND  Result Value Ref Range Status   Specimen Description   Final    BLOOD RIGHT HAND Performed at [facility], [address], [place], [place] [address]    Special Requests   Final    BOTTLES DRAWN AEROBIC ONLY Blood Culture results may not be optimal due to an inadequate volume of blood received in culture bottles Performed at [facility],  [address], [place], [place] [address]    Culture   Final    NO GROWTH 2 DAYS Performed at [facility], [address], [place], [place] [address]    Report Status PENDING  Incomplete  MRSA Next Gen by PCR, Nasal     Status: None   Collection Time: 01/12/24  5:01 PM   Specimen: Nasal Mucosa; Nasal Swab  Result Value Ref Range Status   MRSA by PCR Next Gen NOT DETECTED NOT DETECTED Final    Comment: (NOTE) The GeneXpert MRSA Assay (FDA approved for NASAL specimens only), is one component of a comprehensive MRSA colonization surveillance program. It is not intended to diagnose MRSA infection nor to guide or monitor treatment for MRSA infections. Test performance is not FDA approved in patients less than 10 years old. Performed at [facility], [address] [place], [place] [address]     FURTHER DISCHARGE INSTRUCTIONS:   Get Medicines reviewed and adjusted: Please take all your medications with you for your next visit with your Primary MD   Laboratory/radiological data: Please request your Primary MD to go over all hospital tests and procedure/radiological results at the follow up, please ask your Primary MD to get all Hospital records sent to his/her office.   In some cases, they will be blood work, cultures and biopsy results pending at the time of your discharge. Please request that your primary care M.D. goes through all the records of your hospital data and follows up on these results.   Also Note the following: If you experience worsening of your admission symptoms, develop shortness of breath, life threatening emergency, suicidal or homicidal thoughts you must seek medical attention immediately by calling 911 or calling your MD immediately  if symptoms less severe.   You must read complete instructions/literature along with all the possible adverse reactions/side effects for all the Medicines you take and that have been prescribed to you. Take  any new Medicines after you have completely understood and accpet all the possible adverse reactions/side effects.    patient was instructed, not to drive, operate heavy machinery,  perform activities at heights, swimming or participation in water  activities or provide baby-sitting services while on Pain, Sleep and Anxiety Medications; until their outpatient Physician has advised to do so again. Also recommended to not to take more than prescribed Pain, Sleep and Anxiety Medications.  It is not advisable to combine anxiety, sleep and pain medications without talking with your primary care provider.     Wear Seat belts while driving.   Please note: You were cared for by a hospitalist during your hospital stay. Once you are discharged, your primary care physician will handle any further medical issues. Please note that NO REFILLS for any discharge medications will be authorized once you are discharged, as it is imperative that you return to your primary care physician (or establish a relationship with a primary care physician if you do not have one) for your post hospital discharge needs so that they can reassess your need for medications and monitor your lab values  Time coordinating discharge: Over 30 minutes  SIGNED:   Fredia Skeeter, MD  [facility] 01/13/2024, 11:48 AM *Please note that this is a verbal dictation therefore any spelling or grammatical errors are due to the Dragon Medical One system interpretation. If 7PM-7AM, please contact night-coverage [email or web address]

## 2024-01-13 NOTE — TOC Transition Note (Addendum)
 Transition of Care [facility]) - Discharge Note   Patient Details  Name: [name] MRN: [id number] Date of Birth: [birth year]  [facility] CM/SW Contact:  Bascom Service, RN Phone Number: 01/13/2024, 12:02 PM   Clinical Narrative:   Will do d/c fl2 that dr will co sign. await rm# report# for return back to [facility] aware. PTAR @ d/c.  -1:44p Going back to [facility] rm#[address], report#[phone] [id number]. PTAR called. No further CM needs. -2:30p Await outcome of legal guardian.I have left vm w/2 contacts in epic. [facility] rep [place] will check & let me know outcome.await further info. 3:27p The facility says patient is a+0 & makes own decisions. they do not have a legal guardian on file. Supv agreed to d/c. PTAR called again. No further CM needs.     Final next level of care: Long Term Nursing Home Barriers to Discharge: No Barriers Identified   Patient Goals and CMS Choice Patient states their goals for this hospitalization and ongoing recovery are:: return back to [facility] Medicare.gov Compare Post Acute Care list provided to:: Patient Represenative (must comment) (Steven(brother)) Choice offered to / list presented to : Sibling [place] ownership interest in [facility].provided to:: Sibling    Discharge Placement                Patient to be transferred to facility by: PTAR Name of family member notified: Steven(brother) voicemail for call back. Patient and family notified of of transfer: 01/13/24  Discharge Plan and Services Additional resources added to the After Visit Summary for       Post Acute Care Choice: Nursing Home                               Social Drivers of Health (SDOH) Interventions SDOH Screenings   Food Insecurity: No Food Insecurity (01/11/2024)  Housing: Low Risk  (01/11/2024)  Transportation Needs: No Transportation Needs (01/11/2024)  Utilities: Not At Risk (01/11/2024)   Depression (PHQ2-9): Low Risk  (03/16/2022)  Financial Resource Strain: Low Risk  (01/02/2018)  Physical Activity: Inactive (01/02/2018)  Social Connections: Moderately Integrated (01/02/2018)  Stress: Stress Concern Present (01/02/2018)  Tobacco Use: Low Risk  (01/12/2024)     Readmission Risk Interventions    06/11/2022    3:46 PM  Readmission Risk Prevention Plan  Transportation Screening Complete  PCP or Specialist Appt within 5-7 Days Complete  Home Care Screening Complete  Medication Review (RN CM) Complete

## 2024-01-16 LAB — CULTURE, BLOOD (ROUTINE X 2)
Culture: NO GROWTH
Culture: NO GROWTH

## 2024-03-04 ENCOUNTER — Emergency Department (HOSPITAL_COMMUNITY)
Admission: EM | Admit: 2024-03-04 | Discharge: 2024-03-04 | Disposition: A | Discharge status: Skilled Nursing Facility | Attending: Emergency Medicine | Admitting: Emergency Medicine

## 2024-03-04 ENCOUNTER — Encounter (HOSPITAL_COMMUNITY): Payer: Self-pay | Admitting: *Deleted

## 2024-03-04 ENCOUNTER — Other Ambulatory Visit: Payer: Self-pay

## 2024-03-04 ENCOUNTER — Emergency Department (HOSPITAL_COMMUNITY)

## 2024-03-04 DIAGNOSIS — Z91040 Latex allergy status: Secondary | ICD-10-CM | POA: Diagnosis not present

## 2024-03-04 DIAGNOSIS — R10A1 Flank pain, right side: Secondary | ICD-10-CM | POA: Diagnosis present

## 2024-03-04 DIAGNOSIS — N200 Calculus of kidney: Secondary | ICD-10-CM

## 2024-03-04 DIAGNOSIS — R1024 Suprapubic pain: Secondary | ICD-10-CM | POA: Insufficient documentation

## 2024-03-04 DIAGNOSIS — R110 Nausea: Secondary | ICD-10-CM | POA: Insufficient documentation

## 2024-03-04 LAB — URINALYSIS, ROUTINE W REFLEX MICROSCOPIC
Bilirubin Urine: NEGATIVE
Glucose, UA: NEGATIVE mg/dL
Hgb urine dipstick: NEGATIVE
Ketones, ur: 5 mg/dL — AB
Leukocytes,Ua: NEGATIVE
Nitrite: NEGATIVE
Protein, ur: 30 mg/dL — AB
Specific Gravity, Urine: 1.016 (ref 1.005–1.030)
pH: 7 (ref 5.0–8.0)

## 2024-03-04 LAB — CBC WITH DIFFERENTIAL/PLATELET
Abs Immature Granulocytes: 0.01 K/uL (ref 0.00–0.07)
Basophils Absolute: 0 K/uL (ref 0.0–0.1)
Basophils Relative: 0 %
Eosinophils Absolute: 0.2 K/uL (ref 0.0–0.5)
Eosinophils Relative: 2 %
HCT: 39.8 % (ref 36.0–46.0)
Hemoglobin: 12.7 g/dL (ref 12.0–15.0)
Immature Granulocytes: 0 %
Lymphocytes Relative: 36 %
Lymphs Abs: 2.5 K/uL (ref 0.7–4.0)
MCH: 28 pg (ref 26.0–34.0)
MCHC: 31.9 g/dL (ref 30.0–36.0)
MCV: 87.7 fL (ref 80.0–100.0)
Monocytes Absolute: 0.4 K/uL (ref 0.1–1.0)
Monocytes Relative: 5 %
Neutro Abs: 3.9 K/uL (ref 1.7–7.7)
Neutrophils Relative %: 57 %
Platelets: 228 K/uL (ref 150–400)
RBC: 4.54 MIL/uL (ref 3.87–5.11)
RDW: 14.6 % (ref 11.5–15.5)
WBC: 7 K/uL (ref 4.0–10.5)
nRBC: 0 % (ref 0.0–0.2)

## 2024-03-04 LAB — COMPREHENSIVE METABOLIC PANEL WITH GFR
ALT: 24 U/L (ref 0–44)
AST: 26 U/L (ref 15–41)
Albumin: 4.4 g/dL (ref 3.5–5.0)
Alkaline Phosphatase: 78 U/L (ref 38–126)
Anion gap: 12 (ref 5–15)
BUN: 9 mg/dL (ref 6–20)
CO2: 26 mmol/L (ref 22–32)
Calcium: 9.3 mg/dL (ref 8.9–10.3)
Chloride: 102 mmol/L (ref 98–111)
Creatinine, Ser: 0.37 mg/dL — ABNORMAL LOW (ref 0.44–1.00)
GFR, Estimated: >60 mL/min (ref >60)
Glucose, Bld: 92 mg/dL (ref 70–99)
Potassium: 4.1 mmol/L (ref 3.5–5.1)
Sodium: 140 mmol/L (ref 135–145)
Total Bilirubin: 0.4 mg/dL (ref 0.0–1.2)
Total Protein: 6.7 g/dL (ref 6.5–8.1)

## 2024-03-04 LAB — HCG, SERUM, QUALITATIVE: Preg, Serum: NEGATIVE

## 2024-03-04 LAB — LIPASE, BLOOD: Lipase: 37 U/L (ref 11–51)

## 2024-03-04 MED ORDER — ONDANSETRON 4 MG PO TBDP: 4.0000 mg | ORAL_TABLET | Freq: Three times a day (TID) | ORAL | 0 refills | Status: AC | PRN

## 2024-03-04 MED ORDER — ONDANSETRON 4 MG PO TBDP
4.0000 mg | ORAL_TABLET | Freq: Once | ORAL | Status: AC
  Administered 2024-03-04: 4 mg via ORAL
  Filled 2024-03-04: qty 1

## 2024-03-04 MED ORDER — KETOROLAC TROMETHAMINE 15 MG/ML IJ SOLN
15.0000 mg | Freq: Once | INTRAMUSCULAR | Status: AC
  Administered 2024-03-04: 15 mg via INTRAVENOUS
  Filled 2024-03-04: qty 1

## 2024-03-04 NOTE — ED Provider Notes (Signed)
 [place] EMERGENC[facility] DEPARTMENT AT [facility] Provider Note   CSN: [id number] Arrival date & time: 03/04/24  9584     Patient presents with: Abdominal Pain   [name] is a 43 y.o. female with history of paraplegia, transverse myelitis, MS and frequent UTIs presents with complaints of lower abdominal and right flank pain over the past few days.  Associate with nausea.  Has noted foul-smelling urine with sediment in her Foley.    HPI    Past Medical History:  Diagnosis Date   Bladder stones    Hx of   Depression    GERD (gastroesophageal reflux disease)    Intertrigo 04/24/2017   Left upper extremity swelling 04/24/2017   Multiple sclerosis    Paraplegia (HCC)    Polycystic disease, ovaries    PTSD (post-traumatic stress disorder)    Transverse myelitis (HCC)    Yeast infection 09/26/2017   Past Surgical History:  Procedure Laterality Date   BREAST BIOPSY Right 05/22/2022   US  RT BREAST BX W LOC DEV 1ST LESION IMG BX SPEC US  GUIDE 05/22/2022 [facility] MAMMOGRAPHY   CHOLECYSTECTOMY     CYSTECTOMY W/ URETEROILEAL CONDUIT     IR FLUORO GUIDE CV LINE LEFT  02/25/2017   IR NEPHROURETERAL CATH PLACE RIGHT  10/20/2021   IR US  GUIDE VASC ACCESS LEFT  02/25/2017   IRRIGATION AND DEBRIDEMENT ABSCESS N/A 02/18/2017   Procedure: IRRIGATION AND DEBRIDEMENT ABSCESS;  Surgeon: Mikell Katz, MD;  Location: [facility];  Service: General;  Laterality: N/A;   NEPHROLITHOTOMY Right 10/20/2021   Procedure: RIGHT NEPHROLITHOTOMY PERCUTANEOUS;  Surgeon: Watt Rush, MD;  Location: [facility];  Service: Urology;  Laterality: Right;   TONSILLECTOMY     WISDOM TOOTH EXTRACTION       Prior to Admission medications   Medication Sig Start Date End Date Taking? Authorizing Provider  acetaminophen  (TYLENOL ) 325 MG tablet Take 650 mg by mouth every 4 (four) hours as needed for headache, mild pain or fever. Take 650 mg by mouth two times a day and every 8 hours as needed for  general discomfort    [provider]  acetaminophen  (TYLENOL ) 500 MG tablet Take 1,000 mg by mouth in the morning, at noon, and at bedtime.    [provider]  Albuterol  Sulfate (PROAIR  RESPICLICK) 108 (90 Base) MCG/ACT AEPB Inhale 2 puffs into the lungs every 6 (six) hours as needed.    [provider]  Artificial Tears ophthalmic solution Place 1 drop into both eyes in the morning and at bedtime.    [provider]  ascorbic acid  (VITAMIN C ) 500 MG tablet Take 500 mg by mouth in the morning.    [provider]  baclofen  (LIORESAL ) 20 MG tablet Take 1 tablet (20 mg total) by mouth 4 (four) times daily. 02/13/19   Skeet Juliene SAUNDERS, DO  diazepam  (VALIUM ) 5 MG tablet Take 1-2 tablets (5-10 mg total) by mouth 2 (two) times daily. Take 1 tablet (5 mg) BID and Take 2 tablets (10 mg) at bedtime 01/13/24   Vernon Ranks, MD  Emollient (CERAVE PM) LOTN Apply 1 application  topically See admin instructions. Apply to the face every morning after cleansing    [provider]  EPIPEN  2-PAK 0.3 MG/0.3ML SOAJ injection Inject 0.3 mg into the muscle as needed for anaphylaxis.    [provider]  Fezolinetant  (VEOZAH ) 45 MG TABS Take 1 tablet by mouth in the morning.    [provider]  gabapentin  (NEURONTIN ) 300 MG capsule Take 3 caps ( 900 mg) Three times a day Patient taking differently: Take 300 mg by mouth 3 (three) times daily. 01/30/22   Skeet Juliene SAUNDERS, DO  guaifenesin  (ROBITUSSIN) 100 MG/5ML syrup Take 30 mLs by mouth every 6 (six) hours as needed for cough or congestion.    [provider]  ipratropium-albuterol  (DUONEB) 0.5-2.5 (3) MG/3ML SOLN Take 3 mLs by nebulization every 4 (four) hours as needed.    [provider]  lamoTRIgine  (LAMICTAL ) 100 MG tablet Take 100 mg by mouth 2 (two) times daily.    [provider]  loratadine  (CLARITIN ) 10 MG tablet Take 10 mg by mouth as needed for allergies.    [provider]  magnesium  hydroxide (MILK OF MAGNESIA) 400 MG/5ML suspension Take 30 mLs by mouth as needed for mild constipation.    [provider]  Multiple Vitamins-Minerals (MULTIVITAMIN WITH MINERALS) tablet Take 1 tablet by mouth in the morning.    [provider]  omeprazole (PRILOSEC) 20 MG capsule Take 20 mg by mouth in the morning.    [provider]  promethazine  (PHENERGAN ) 12.5 MG tablet Take 12.5 mg by mouth every 6 (six) hours as needed for nausea or vomiting.    [provider]  SALINE MIST SPRAY NA Place 1 spray into the nose every 4 (four) hours as needed.    [provider]  SERTRALINE  HCL PO Take 75 mg by mouth in the morning.    [provider]  sodium fluoride (PREVIDENT 5000 PLUS) 1.1 % CREA dental cream Place 1 Application onto teeth at bedtime.    [provider]  Sodium Phosphates  (FLEET ENEMA) 7-19 GM/197ML ENEM Place 118 mLs rectally as needed.    [provider]  UNABLE TO FIND Take 1 Dose by mouth daily. Med Pass 2.0 Liquid: Once a day for enhanced k/cal supplement.    [provider]    Allergies: Iodine, Latex, Other, Polymyxin b , Shellfish protein-containing drug products, Vancomycin, Aspirin, Fish protein-containing drug products, Iodinated contrast media, and Strawberry extract    Review of Systems  Gastrointestinal:  Positive for abdominal pain.    Updated Vital Signs BP 100/67 (BP Location: Right Arm)   Pulse 70   Temp 97.7 F (36.5 C) (Oral)   Resp 17   SpO2 95%   Physical Exam Vitals and nursing note reviewed.  Constitutional:      General: She is not in acute distress.    Appearance: She is well-developed.  HENT:     Head: Normocephalic and atraumatic.  Eyes:     Conjunctiva/sclera: Conjunctivae normal.  Cardiovascular:     Rate and Rhythm: Normal rate and regular rhythm.     Heart sounds: No murmur heard. Pulmonary:     Effort: Pulmonary effort is normal.  No respiratory distress.     Breath sounds: Normal breath sounds.  Abdominal:     Palpations: Abdomen is soft.     Tenderness: There is no abdominal tenderness.  Musculoskeletal:        General: No swelling.     Cervical back: Neck supple.  Skin:    General: Skin is warm and dry.     Capillary Refill: Capillary refill takes less than 2 seconds.  Neurological:     Mental Status: She is alert.  Psychiatric:        Mood and Affect: Mood normal.     (all labs ordered are listed, but only abnormal results  are displayed) Labs Reviewed  COMPREHENSIVE METABOLIC PANEL WITH GFR - Abnormal; Notable for the following components:      Result Value   Creatinine, Ser 0.37 (*)    All other components within normal limits  URINALYSIS, ROUTINE W REFLEX MICROSCOPIC - Abnormal; Notable for the following components:   APPearance TURBID (*)    Ketones, ur 5 (*)    Protein, ur 30 (*)    Bacteria, UA MANY (*)    All other components within normal limits  URINE CULTURE  CBC WITH DIFFERENTIAL/PLATELET  LIPASE, BLOOD  HCG, SERUM, QUALITATIVE    EKG: None  Radiology: No results found.   Procedures   Medications Ordered in the ED - No data to display  Clinical Course as of 03/04/24 0638  Wed Mar 04, 2024  0510 Patient with history of paraplegia and frequent UTIs evaluated for right lower abdominal and flank pain with associated foul-smelling urine and increased sediment in her urostomy bag.  Upon arrival she is hemodynamically stable.  She does have some generalized abdominal tenderness to right side of abdomen with right CVAT.  Will obtain routine labs and renal study.  Is allergic to contrast [JT]  0531 CBC with Differential Unremarkable [JT]  0549 Comprehensive metabolic panel(!) No significant abnormality [JT]  0550 Lipase, blood Within normal limit [JT]  0623 Urinalysis, Routine w reflex microscopic -Urine, Unspecified Source(!) Many bacteria with negative nitrites and negative  leukocytes, 0-5 WBCs [JT]    Clinical Course User Index [JT] Donnajean Lynwood DEL, PA-C                                 Medical Decision Making  This patient presents to the ED with chief complaint(s) of Abdominal pain.  The complaint involves an extensive differential diagnosis and also carries with it a high risk of complications and morbidity.   Pertinent past medical history as listed in HPI  The differential diagnosis includes  UTI, nephrolithiasis, pyelonephritis Additional history obtained: Additional history obtained from EMS  Records reviewed Care Everywhere/External Records  Disposition:   Signout given to [employer], PA-C.  Please see her note for remainder the visit.  Disposition pending workup.  Social Determinants of Health:   none  This note was dictated with voice recognition software.  Despite best efforts at proofreading, errors may have occurred which can change the documentation meaning.       Final diagnoses:  Suprapubic pain    ED Discharge Orders     None          Akin Yi H, PA-C 03/04/24 9361    Palumbo, April, MD 03/04/24 [phone]

## 2024-03-04 NOTE — ED Notes (Signed)
 Lab notified of pregnancy test added onto urine sample.

## 2024-03-04 NOTE — ED Provider Notes (Signed)
  Physical Exam  BP 100/67 (BP Location: Right Arm)   Pulse 70   Temp 97.7 F (36.5 C) (Oral)   Resp 17   SpO2 95%   Physical Exam  Procedures  Procedures  ED Course / MDM   Clinical Course as of 03/04/24 0634  Wed Mar 04, 2024  0510 Patient with history of paraplegia and frequent UTIs evaluated for right lower abdominal and flank pain with associated foul-smelling urine and increased sediment in her urostomy bag.  Upon arrival she is hemodynamically stable.  She does have some generalized abdominal tenderness to right side of abdomen with right CVAT.  Will obtain routine labs and renal study.  Is allergic to contrast [JT]  0531 CBC with Differential Unremarkable [JT]  0549 Comprehensive metabolic panel(!) No significant abnormality [JT]  0550 Lipase, blood Within normal limit [JT]  0623 Urinalysis, Routine w reflex microscopic -Urine, Unspecified Source(!) Many bacteria with negative nitrites and negative leukocytes, 0-5 WBCs [JT]    Clinical Course User Index [JT] Donnajean Lynwood DEL, PA-C   Medical Decision Making Amount and/or Complexity of Data Reviewed Labs: ordered. Decision-making details documented in ED Course. Radiology: ordered.  Risk Prescription drug management.  I received this patient in handoff from [place], PA-C.  Briefly patient is a paraplegic with a urostomy bag who presents to the emergency department for lower abdominal and flank pain.  She does have a history of frequent UTIs and kidney stones in the past.  Please see note from [place], PA-C for full history.  06:30 At this time, I am waiting for urine culture and renal stone study.  09:56 Renal stone study shows 1. Nonobstructing nephrolithiasis bilaterally.  2. 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 2  adjacent punctate calcifications over the site where the ureters join the ileum  in the lower abdomen, which may have been recently passed.  Urine culture still  pending.  10:24 AM At this time, will plan to discharge patient with Zofran  and pain control recommendations.  I did inform the patient that her stones are nonobstructing and would likely pass on their own.  Prior to discharge, I am giving her a dose of Toradol  and Zofran .  Patient educated on strict return precautions including worsening pain, vomiting, fever, body aches, chest pain or shortness of breath.  Patient verbalized her understanding to this.  Her vital signs are stable.  Patient is appropriate for discharge at this time.    Torrence Marry GORMAN DEVONNA 03/04/24 1025    Palumbo, April, MD 03/04/24 2314

## 2024-03-04 NOTE — ED Notes (Signed)
 [facility] called

## 2024-03-04 NOTE — Discharge Instructions (Addendum)
 It was a pleasure taking care of you today. You were seen in the Emergency Department for evaluation of abdominal pain and nausea. Your work-up was reassuring. Your CT/Xray/Labs showed bilateral small nonobstructing kidney stones.  I believe this is the likely cause of your pain.  Your urine was not infected so there is no need for antibiotics at this time.  I did give you a prescription for Zofran , which is an antinausea medicine.  You can take this every 8 hours as needed for continued nausea.  I recommend taking ibuprofen  and/or Tylenol  as needed for continued pain management.  The stone should pass on their own without intervention. Refer to the attached documentation for further management of your symptoms.  Please return to the emergency department for worsening pain, fever, chills, vomiting or any other life-threatening emergencies such as chest pain or shortness of breath.

## 2024-03-04 NOTE — ED Notes (Signed)
 Called Maple Springer RN Report given

## 2024-03-04 NOTE — ED Triage Notes (Signed)
 Patient brought in by medic per medic patient reports Lower abdominal and back pain for 2 days with nausea, Facility gave patient nausea meds promethazine  around 0000 patient stated that it helped her. Patient stated in her foley bag her urine has a lot of sediment and foul smell. Patient is a paraplegic.

## 2024-03-04 NOTE — ED Notes (Signed)
 Patient given saltine crackers and cup of sprite

## 2024-03-06 LAB — URINE CULTURE: Culture: >=100000 — AB

## 2024-03-07 ENCOUNTER — Telehealth (HOSPITAL_BASED_OUTPATIENT_CLINIC_OR_DEPARTMENT_OTHER): Payer: Self-pay | Admitting: *Deleted

## 2024-03-07 NOTE — Progress Notes (Signed)
 ED Antimicrobial Stewardship Positive Culture Follow Up   [name] is an 43 y.o. female with hx MS and paragleia who presented to [facility] on 03/04/2024 with a chief complaint of abdominal and right flank pain.  Chief Complaint  Patient presents with   Abdominal Pain    Recent Results (from the past 720 hours)  Urine Culture     Status: Abnormal   Collection Time: 03/04/24  8:20 AM   Specimen: Urine, Clean Catch  Result Value Ref Range Status   Specimen Description   Final    URINE, CLEAN CATCH Performed at [facility], [address], [place], [place] [address]    Special Requests   Final    NONE Performed at [facility], [address] [place], [place] [address]    Culture (A)  Final    >=100,000 COLONIES/mL PROTEUS MIRABILIS >=100,000 COLONIES/mL AEROCOCCUS SPECIES Standardized susceptibility testing for this organism is not available. Performed at [facility], [address], [place], [place] [address]    Report Status 03/06/2024 FINAL  Final   Organism ID, Bacteria PROTEUS MIRABILIS (A)  Final      Susceptibility   Proteus mirabilis - MIC*    AMPICILLIN  >=32 RESISTANT Resistant     CEFAZOLIN (URINE) Value in next row Sensitive      2 SENSITIVEThis is a modified FDA-approved test that has been validated and its performance characteristics determined by the reporting laboratory.  This laboratory is certified under the Clinical Laboratory Improvement Amendments CLIA as qualified to perform high complexity clinical laboratory testing.    CEFEPIME  Value in next row Sensitive      2 SENSITIVEThis is a modified FDA-approved test that has been validated and its performance characteristics determined by the reporting laboratory.  This laboratory is certified under the Clinical Laboratory Improvement Amendments CLIA as qualified to perform high complexity clinical laboratory testing.    ERTAPENEM Value in next row  Sensitive      2 SENSITIVEThis is a modified FDA-approved test that has been validated and its performance characteristics determined by the reporting laboratory.  This laboratory is certified under the Clinical Laboratory Improvement Amendments CLIA as qualified to perform high complexity clinical laboratory testing.    CEFTRIAXONE  Value in next row Sensitive      2 SENSITIVEThis is a modified FDA-approved test that has been validated and its performance characteristics determined by the reporting laboratory.  This laboratory is certified under the Clinical Laboratory Improvement Amendments CLIA as qualified to perform high complexity clinical laboratory testing.    CIPROFLOXACIN  Value in next row Resistant      2 SENSITIVEThis is a modified FDA-approved test that has been validated and its performance characteristics determined by the reporting laboratory.  This laboratory is certified under the Clinical Laboratory Improvement Amendments CLIA as qualified to perform high complexity clinical laboratory testing.    GENTAMICIN Value in next row Sensitive      2 SENSITIVEThis is a modified FDA-approved test that has been validated and its performance characteristics determined by the reporting laboratory.  This laboratory is certified under the Clinical Laboratory Improvement Amendments CLIA as qualified to perform high complexity clinical laboratory testing.    NITROFURANTOIN Value in next row Resistant      2 SENSITIVEThis is a modified FDA-approved test that has been validated and its performance characteristics determined by the reporting laboratory.  This laboratory is certified under the Clinical Laboratory Improvement Amendments CLIA as qualified to  perform high complexity clinical laboratory testing.    TRIMETH /SULFA  Value in next row Resistant      2 SENSITIVEThis is a modified FDA-approved test that has been validated and its performance characteristics determined by the reporting laboratory.   This laboratory is certified under the Clinical Laboratory Improvement Amendments CLIA as qualified to perform high complexity clinical laboratory testing.    AMPICILLIN /SULBACTAM Value in next row Sensitive      2 SENSITIVEThis is a modified FDA-approved test that has been validated and its performance characteristics determined by the reporting laboratory.  This laboratory is certified under the Clinical Laboratory Improvement Amendments CLIA as qualified to perform high complexity clinical laboratory testing.    PIP/TAZO Value in next row Sensitive      8 SENSITIVEThis is a modified FDA-approved test that has been validated and its performance characteristics determined by the reporting laboratory.  This laboratory is certified under the Clinical Laboratory Improvement Amendments CLIA as qualified to perform high complexity clinical laboratory testing.    MEROPENEM  Value in next row Sensitive      8 SENSITIVEThis is a modified FDA-approved test that has been validated and its performance characteristics determined by the reporting laboratory.  This laboratory is certified under the Clinical Laboratory Improvement Amendments CLIA as qualified to perform high complexity clinical laboratory testing.    * >=100,000 COLONIES/mL PROTEUS MIRABILIS   Plan:   [x]  Patient discharged originally without antimicrobial agent and treatment is now indicated  New antibiotic prescription: start keflex  500 mg QID x10 days  ED Provider: Dr. Francesca Batch, Cleaster Shiffer P 03/07/2024, 8:27 AM Clinical Pharmacist [phone]

## 2024-03-07 NOTE — Telephone Encounter (Signed)
 Post ED Visit - Positive Culture Follow-up: Successful Patient Follow-Up  Culture assessed and recommendations reviewed by:  []  Rankin Dee, Pharm.D. []  Venetia Gully, Pharm.D., BCPS AQ-ID []  Garrel Crews, Pharm.D., BCPS []  Almarie Lunger, Pharm.D., BCPS []  [place], [address].D., BCPS, AAHIVP []  Rosaline Bihari, Pharm.D., BCPS, AAHIVP []  Vernell Meier, PharmD, BCPS []  Latanya Hint, PharmD, BCPS []  Donald Medley, PharmD, BCPS [x]  Iantha Batch, PharmD  Positive urine culture  [x]  Patient discharged without antimicrobial prescription and treatment is now indicated []  Organism is resistant to prescribed ED discharge antimicrobial []  Patient with positive blood cultures  Changes discussed with ED provider: Elsie Body, MD  New antibiotic prescription Keflex  500mg  QID x 10 days  Faxed culture report to [facility]  Contacted facility, date 03/07/24, time 0909   [name] 03/07/2024, 9:07 AM

## 2024-05-19 ENCOUNTER — Ambulatory Visit: Payer: Medicaid Other | Admitting: Neurology
